# Patient Record
Sex: Female | Born: 1940 | Race: White | Hispanic: No | Smoking: Former smoker
Health system: Southern US, Community
[De-identification: ages and names within clinical notes are randomized; demographics above are authoritative.]

## PROBLEM LIST (undated history)

## (undated) ENCOUNTER — Emergency Department (HOSPITAL_COMMUNITY): Discharge status: Against Medical Advice | Payer: Medicare PPO

## (undated) DIAGNOSIS — K760 Fatty (change of) liver, not elsewhere classified: Secondary | ICD-10-CM

## (undated) DIAGNOSIS — IMO0001 Reserved for inherently not codable concepts without codable children: Secondary | ICD-10-CM

## (undated) DIAGNOSIS — K5730 Diverticulosis of large intestine without perforation or abscess without bleeding: Secondary | ICD-10-CM

## (undated) DIAGNOSIS — Q211 Atrial septal defect: Secondary | ICD-10-CM

## (undated) DIAGNOSIS — F419 Anxiety disorder, unspecified: Secondary | ICD-10-CM

## (undated) DIAGNOSIS — M545 Low back pain, unspecified: Secondary | ICD-10-CM

## (undated) DIAGNOSIS — H7090 Unspecified mastoiditis, unspecified ear: Secondary | ICD-10-CM

## (undated) DIAGNOSIS — E039 Hypothyroidism, unspecified: Secondary | ICD-10-CM

## (undated) DIAGNOSIS — R51 Headache: Secondary | ICD-10-CM

## (undated) DIAGNOSIS — IMO0002 Reserved for concepts with insufficient information to code with codable children: Secondary | ICD-10-CM

## (undated) DIAGNOSIS — H269 Unspecified cataract: Secondary | ICD-10-CM

## (undated) DIAGNOSIS — T7840XA Allergy, unspecified, initial encounter: Secondary | ICD-10-CM

## (undated) DIAGNOSIS — C419 Malignant neoplasm of bone and articular cartilage, unspecified: Secondary | ICD-10-CM

## (undated) DIAGNOSIS — I639 Cerebral infarction, unspecified: Secondary | ICD-10-CM

## (undated) DIAGNOSIS — S2239XA Fracture of one rib, unspecified side, initial encounter for closed fracture: Secondary | ICD-10-CM

## (undated) DIAGNOSIS — E785 Hyperlipidemia, unspecified: Secondary | ICD-10-CM

## (undated) DIAGNOSIS — I679 Cerebrovascular disease, unspecified: Secondary | ICD-10-CM

## (undated) DIAGNOSIS — Z8679 Personal history of other diseases of the circulatory system: Secondary | ICD-10-CM

## (undated) DIAGNOSIS — J3489 Other specified disorders of nose and nasal sinuses: Secondary | ICD-10-CM

## (undated) DIAGNOSIS — I10 Essential (primary) hypertension: Secondary | ICD-10-CM

## (undated) DIAGNOSIS — K5792 Diverticulitis of intestine, part unspecified, without perforation or abscess without bleeding: Secondary | ICD-10-CM

## (undated) DIAGNOSIS — K219 Gastro-esophageal reflux disease without esophagitis: Secondary | ICD-10-CM

## (undated) DIAGNOSIS — C801 Malignant (primary) neoplasm, unspecified: Secondary | ICD-10-CM

## (undated) DIAGNOSIS — M79604 Pain in right leg: Secondary | ICD-10-CM

## (undated) DIAGNOSIS — R0602 Shortness of breath: Secondary | ICD-10-CM

## (undated) DIAGNOSIS — K648 Other hemorrhoids: Secondary | ICD-10-CM

## (undated) HISTORY — PX: CATARACT EXTRACTION: SUR2

## (undated) HISTORY — DX: Reserved for concepts with insufficient information to code with codable children: IMO0002

## (undated) HISTORY — PX: POLYPECTOMY: SHX149

## (undated) HISTORY — PX: TUBAL LIGATION: SHX77

## (undated) HISTORY — PX: CHOLECYSTECTOMY: SHX55

## (undated) HISTORY — DX: Malignant (primary) neoplasm, unspecified: C80.1

## (undated) HISTORY — DX: Personal history of other diseases of the circulatory system: Z86.79

## (undated) HISTORY — DX: Hyperlipidemia, unspecified: E78.5

## (undated) HISTORY — DX: Reserved for inherently not codable concepts without codable children: IMO0001

## (undated) HISTORY — DX: Pain in right leg: M79.604

## (undated) HISTORY — DX: Cerebrovascular disease, unspecified: I67.9

## (undated) HISTORY — DX: Diverticulitis of intestine, part unspecified, without perforation or abscess without bleeding: K57.92

## (undated) HISTORY — DX: Diverticulosis of large intestine without perforation or abscess without bleeding: K57.30

## (undated) HISTORY — DX: Low back pain, unspecified: M54.50

## (undated) HISTORY — PX: TONSILLECTOMY: SHX5217

## (undated) HISTORY — PX: UPPER GASTROINTESTINAL ENDOSCOPY: SHX188

## (undated) HISTORY — DX: Cerebral infarction, unspecified: I63.9

## (undated) HISTORY — DX: Unspecified cataract: H26.9

## (undated) HISTORY — DX: Gastro-esophageal reflux disease without esophagitis: K21.9

## (undated) HISTORY — DX: Unspecified mastoiditis, unspecified ear: H70.90

## (undated) HISTORY — DX: Atrial septal defect: Q21.1

## (undated) HISTORY — DX: Other specified disorders of nose and nasal sinuses: J34.89

## (undated) HISTORY — PX: BREAST LUMPECTOMY: SHX2

## (undated) HISTORY — PX: COLONOSCOPY: SHX174

## (undated) HISTORY — DX: Hypothyroidism, unspecified: E03.9

## (undated) HISTORY — DX: Other hemorrhoids: K64.8

## (undated) HISTORY — DX: Anxiety disorder, unspecified: F41.9

## (undated) HISTORY — PX: SHOULDER SURGERY: SHX246

## (undated) HISTORY — DX: Fatty (change of) liver, not elsewhere classified: K76.0

## (undated) HISTORY — DX: Essential (primary) hypertension: I10

## (undated) HISTORY — DX: Low back pain: M54.5

---

## 2005-04-30 DIAGNOSIS — C801 Malignant (primary) neoplasm, unspecified: Secondary | ICD-10-CM

## 2005-04-30 HISTORY — DX: Malignant (primary) neoplasm, unspecified: C80.1

## 2009-06-28 LAB — HM COLONOSCOPY

## 2009-08-02 DIAGNOSIS — K760 Fatty (change of) liver, not elsewhere classified: Secondary | ICD-10-CM

## 2009-08-02 HISTORY — DX: Fatty (change of) liver, not elsewhere classified: K76.0

## 2010-01-28 DIAGNOSIS — I639 Cerebral infarction, unspecified: Secondary | ICD-10-CM

## 2010-01-28 HISTORY — DX: Cerebral infarction, unspecified: I63.9

## 2010-02-17 ENCOUNTER — Encounter: Payer: Self-pay | Admitting: Emergency Medicine

## 2010-02-17 ENCOUNTER — Inpatient Hospital Stay (HOSPITAL_COMMUNITY): Admission: EM | Admit: 2010-02-17 | Discharge: 2010-02-21 | Payer: Self-pay | Admitting: Internal Medicine

## 2010-02-20 ENCOUNTER — Encounter (INDEPENDENT_AMBULATORY_CARE_PROVIDER_SITE_OTHER): Payer: Self-pay | Admitting: Internal Medicine

## 2010-02-21 ENCOUNTER — Ambulatory Visit: Payer: Self-pay | Admitting: Surgery

## 2010-02-21 ENCOUNTER — Encounter (INDEPENDENT_AMBULATORY_CARE_PROVIDER_SITE_OTHER): Payer: Self-pay | Admitting: Neurology

## 2010-02-21 ENCOUNTER — Encounter (INDEPENDENT_AMBULATORY_CARE_PROVIDER_SITE_OTHER): Payer: Self-pay | Admitting: Cardiology

## 2010-03-03 ENCOUNTER — Ambulatory Visit: Payer: Self-pay | Admitting: Internal Medicine

## 2010-03-03 DIAGNOSIS — K5730 Diverticulosis of large intestine without perforation or abscess without bleeding: Secondary | ICD-10-CM

## 2010-03-03 DIAGNOSIS — E785 Hyperlipidemia, unspecified: Secondary | ICD-10-CM

## 2010-03-03 DIAGNOSIS — E039 Hypothyroidism, unspecified: Secondary | ICD-10-CM

## 2010-03-03 DIAGNOSIS — I679 Cerebrovascular disease, unspecified: Secondary | ICD-10-CM

## 2010-03-03 HISTORY — DX: Diverticulosis of large intestine without perforation or abscess without bleeding: K57.30

## 2010-03-03 HISTORY — DX: Hypothyroidism, unspecified: E03.9

## 2010-03-03 HISTORY — DX: Hyperlipidemia, unspecified: E78.5

## 2010-03-03 HISTORY — DX: Cerebrovascular disease, unspecified: I67.9

## 2010-03-09 ENCOUNTER — Encounter
Admission: RE | Admit: 2010-03-09 | Discharge: 2010-03-28 | Payer: Self-pay | Source: Home / Self Care | Admitting: Internal Medicine

## 2010-03-14 ENCOUNTER — Encounter: Payer: Self-pay | Admitting: Internal Medicine

## 2010-03-28 ENCOUNTER — Telehealth: Payer: Self-pay | Admitting: Internal Medicine

## 2010-03-28 ENCOUNTER — Encounter: Payer: Self-pay | Admitting: Internal Medicine

## 2010-04-10 ENCOUNTER — Ambulatory Visit: Payer: Self-pay | Admitting: Family Medicine

## 2010-04-10 DIAGNOSIS — M549 Dorsalgia, unspecified: Secondary | ICD-10-CM | POA: Insufficient documentation

## 2010-04-12 ENCOUNTER — Telehealth: Payer: Self-pay | Admitting: Internal Medicine

## 2010-04-18 ENCOUNTER — Encounter: Payer: Self-pay | Admitting: Internal Medicine

## 2010-04-20 ENCOUNTER — Telehealth: Payer: Self-pay | Admitting: Internal Medicine

## 2010-04-25 ENCOUNTER — Telehealth: Payer: Self-pay | Admitting: Internal Medicine

## 2010-05-02 ENCOUNTER — Encounter
Admission: RE | Admit: 2010-05-02 | Discharge: 2010-05-02 | Payer: Self-pay | Source: Home / Self Care | Attending: Rheumatology | Admitting: Rheumatology

## 2010-05-18 ENCOUNTER — Encounter: Payer: Self-pay | Admitting: Internal Medicine

## 2010-05-18 ENCOUNTER — Telehealth: Payer: Self-pay | Admitting: Internal Medicine

## 2010-05-23 ENCOUNTER — Ambulatory Visit
Admission: RE | Admit: 2010-05-23 | Discharge: 2010-05-23 | Payer: Self-pay | Source: Home / Self Care | Attending: Internal Medicine | Admitting: Internal Medicine

## 2010-05-23 ENCOUNTER — Other Ambulatory Visit: Payer: Self-pay | Admitting: Internal Medicine

## 2010-05-23 LAB — LIPID PANEL
HDL: 70.9 mg/dL (ref >39.00)
LDL Cholesterol: 97 mg/dL (ref 0–99)
Total CHOL/HDL Ratio: 3
VLDL: 13.2 mg/dL (ref 0.0–40.0)

## 2010-05-23 LAB — HEMOGLOBIN A1C: Hgb A1c MFr Bld: 6 % (ref 4.6–6.5)

## 2010-05-25 ENCOUNTER — Ambulatory Visit: Admission: RE | Admit: 2010-05-25 | Discharge: 2010-05-25 | Payer: Self-pay | Source: Home / Self Care

## 2010-05-25 DIAGNOSIS — I4891 Unspecified atrial fibrillation: Secondary | ICD-10-CM

## 2010-05-29 ENCOUNTER — Encounter: Payer: Self-pay | Admitting: Internal Medicine

## 2010-05-30 NOTE — Assessment & Plan Note (Signed)
Summary: NEW PT EST (MEDICARE PT) // RS   Vital Signs:  Young profile:   70 year old female Height:      61.5 inches Weight:      137 pounds BMI:     25.56 Temp:     98.0 degrees F oral BP sitting:   112 / 80  (right arm) Cuff size:   regular  Vitals Entered By: Duard Brady LPN (March 03, 2010 1:03 PM) CC: new to establish - was in cones for TIA Is Young Diabetic? No   CC:  new to establish - was in cones for TIA.  History of Present Illness: Erica Young who is seen today to establish with our practice.  She has no hospitalized recently for an acute right-sided CVA with left facial droop.  She is scheduled for a neurology follow-up in the near future.  She is also scheduled for a transcranial Doppler bubble study.  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 father died in his early 19s from gastric cancer mother died of complications of cerebral vascular disease in her early 8s two brothers 4 sisters-positive for coronary artery disease and cerebral vascular disease  Social History: Reviewed history and no changes required. Married former tobacco user recently relocated from [place], [place] Masters degreeSmoking Status:  quit  Review of Systems  The Young denies anorexia, fever, weight loss, weight  gain, vision loss, decreased hearing, hoarseness, chest pain, syncope, dyspnea on exertion, peripheral edema, prolonged cough, headaches, hemoptysis, abdominal pain, melena, hematochezia, severe indigestion/heartburn, hematuria, incontinence, genital sores, muscle weakness, suspicious skin lesions, transient blindness, difficulty walking, depression, unusual weight change, abnormal bleeding, enlarged lymph nodes, angioedema, and breast masses.    Physical Exam  General:  Well-developed,well-nourished,in no acute distress; alert,appropriate and cooperative throughout examination; normal blood pressure Head:  Normocephalic and atraumatic without obvious abnormalities. No apparent alopecia or balding. Eyes:  No corneal or conjunctival inflammation noted. EOMI. Perrla. Funduscopic exam benign, without hemorrhages, exudates or papilledema. Vision grossly normal. Ears:  External ear exam shows no significant lesions or deformities.  Otoscopic examination reveals clear canals, tympanic membranes are intact bilaterally without bulging, retraction, inflammation or discharge. Hearing is grossly normal bilaterally. Mouth:  Oral mucosa and oropharynx without lesions or exudates.  Teeth in good repair. Neck:  No deformities, masses, or tenderness noted. Chest Wall:  No deformities, masses, or tenderness noted. Lungs:  Normal respiratory effort, chest expands symmetrically. Lungs are clear to auscultation, no crackles or wheezes. Heart:  Normal rate and regular rhythm. S1 and S2 normal without gallop, murmur, click, rub or other extra sounds. Abdomen:  Bowel sounds positive,abdomen soft and non-tender without masses, organomegaly or hernias noted. Msk:  No deformity or scoliosis noted of thoracic or lumbar spine.   Pulses:  R and L carotid,radial,femoral,dorsalis pedis and posterior tibial pulses are full and equal bilaterally Extremities:  No clubbing, cyanosis, edema, or deformity noted with normal full range of  motion of all joints.   Neurologic:  suggestion  of a slight left facial droop mild dyspraxia on finger to  Nose testing on the left Skin:  Intact without suspicious lesions or rashes Cervical Nodes:  No lymphadenopathy noted Psych:  Cognition and judgment appear intact. Alert and cooperative with normal attention span and concentration. No apparent delusions, illusions, hallucinations   Impression & Recommendations:  Problem # 1:  HYPERLIPIDEMIA (ICD-272.4)  Her updated medication list for this problem includes:    Lipitor 10 Mg Tabs (Atorvastatin calcium) ..... Qd  Problem # 2:  HYPOTHYROIDISM (ICD-244.9)  Her updated medication list for this problem includes:    Levothyroxine Sodium 25 Mcg Tabs (Levothyroxine sodium) ..... One daily  Problem # 3:  DIVERTICULOSIS, COLON (ICD-562.10)  Problem # 4:  PREVENTIVE HEALTH CARE (ICD-V70.0)  Problem # 5:  CEREBROVASCULAR DISEASE (ICD-437.9)  Complete Medication List: 1)  Lipitor 10 Mg Tabs (Atorvastatin calcium) .... Qd 2)  Plavix 75 Mg Tabs (Clopidogrel bisulfate) .... Qd 3)  Hydroxyzine Hcl 25 Mg Tabs (Hydroxyzine hcl) .... Prn 4)  Caltrate 600+d 600-400 Mg-unit Tabs (Calcium carbonate-vitamin d) .... Qd 5)  Selenium 100 Mcg Tabs (Selenium) .... Qd 6)  Vitamin C 500 Mg Tabs (Ascorbic acid) .... Qd 7)  Align Caps (Probiotic product) .... Qd 8)  Colostrum 500 Mg Caps (Misc natural products) .... Qd 9)  Shark Cartilage 500 Mg Caps (Shark cartilage) .... Qd 10)  Levothyroxine Sodium 25 Mcg Tabs (Levothyroxine sodium) .... One daily  Young Instructions: 1)  Please schedule a follow-up appointment in 1 month. 2)  Limit your Sodium (Salt). 3)  It is important that you exercise regularly at least 20 minutes 5 times a week. If you develop chest pain, have severe difficulty breathing, or feel very tired , stop exercising immediately and seek medical attention. 4)  neurology follow-up as scheduled 5)  physical therapy as  scheduled andPrescriptions: LEVOTHYROXINE SODIUM 25 MCG TABS (LEVOTHYROXINE SODIUM) one daily  #90 x 0   Entered and Authorized by:   Gordy Savers  MD   Signed by:   Gordy Savers  MD on 03/03/2010   Method used:   Print then Give to Young   RxID:   [id number] PLAVIX 75 MG TABS (CLOPIDOGREL BISULFATE) qd  #90 x 0   Entered and Authorized by:   Gordy Savers  MD   Signed by:   Gordy Savers  MD on 03/03/2010   Method used:   Print then Give to Young   RxID:   [id number] LIPITOR 10 MG TABS (ATORVASTATIN CALCIUM) qd  #90 x 0   Entered and Authorized by:   Gordy Savers  MD   Signed by:   Gordy Savers  MD on 03/03/2010   Method used:   Print then Give to Young   RxID:   [id number]    Orders Added: 1)  New Young 65&> [64403]   Immunization History:  Influenza Immunization History:    Influenza:  Historical (01/28/2010)   Immunization History:  Influenza Immunization History:    Influenza:  Historical (01/28/2010)  given in [place]   KIK  Appended Document: Orders Update    Clinical Lists Changes  Orders: Added new Referral order of Physical Therapy Referral (PT) - Signed

## 2010-05-30 NOTE — Miscellaneous (Signed)
Summary: Initial Summary for PT Services/[facility]  Initial Summary for PT Services/ Rehab   Imported By: Maryln Gottron 03/16/2010 11:31:12  _____________________________________________________________________  External Attachment:    Type:   Image     Comment:   External Document

## 2010-05-30 NOTE — Progress Notes (Signed)
Summary: refill plavix  Phone Note Refill Request Message from:  Fax from Pharmacy on March 28, 2010 12:04 PM  Refills Requested: Medication #1:  PLAVIX 75 MG TABS qd [facility]   Method Requested: Fax to Local Pharmacy Initial call taken by: Duard Brady LPN,  March 28, 2010 12:05 PM    Prescriptions: PLAVIX 75 MG TABS (CLOPIDOGREL BISULFATE) qd  #90 x 0   Entered by:   Duard Brady LPN   Authorized by:   Gordy Savers  MD   Signed by:   Duard Brady LPN on 78/46/9629   Method used:   Faxed to ...       [facility] [address] # [phone]* (retail)       [address]       [place], [place]  [address]       Ph: [phone]       Fax: [phone]   RxID:   [id number]

## 2010-05-30 NOTE — Miscellaneous (Signed)
Summary: Discharge Summary for PT [facility]  Discharge Summary for PT Services/[facility]   Imported By: Maryln Gottron 03/31/2010 11:09:54  _____________________________________________________________________  External Attachment:    Type:   Image     Comment:   External Document

## 2010-05-31 ENCOUNTER — Encounter: Payer: Self-pay | Admitting: Rheumatology

## 2010-06-01 NOTE — Letter (Signed)
Summary: Request for Medical Clearance/[facility]  Request for Medical Clearance/[facility]   Imported By: Maryln Gottron 04/27/2010 15:27:16  _____________________________________________________________________  External Attachment:    Type:   Image     Comment:   External Document

## 2010-06-01 NOTE — Progress Notes (Signed)
Summary: pt is on lipitor  Phone Note Call from Patient   Caller: Patient Call For: Gordy Savers  MD Summary of Call: pt is on lipitor.  pt is requesting lipid test. Can I sch? Initial call taken by: Heron Sabins,  May 18, 2010 4:30 PM  Follow-up for Phone Call        yes Follow-up by: Gordy Savers  MD,  May 18, 2010 5:10 PM  Additional Follow-up for Phone Call Additional follow up Details #1::        05-23-2010 815am Additional Follow-up by: Heron Sabins,  May 19, 2010 8:42 AM

## 2010-06-01 NOTE — Assessment & Plan Note (Signed)
Summary: leg/back pain/njr   Vital Signs:  Young profile:   70 year old female Weight:      139 pounds Temp:     98.0 degrees F oral BP sitting:   120 / 70  (left arm) Cuff size:   regular  Vitals Entered By: Duard Brady LPN (April 10, 2010 12:56 PM) CC: pain in leg/back on/off x3 months Is Young Diabetic? No   CC:  pain in leg/back on/off x3 months.  History of Present Illness:  Erica Young who presents with a 3 to 4 month history oflow back pain with radiation down the right leg.  This was evaluated in [place] state in September that included a lumbar MRI.  The Young has a DVD of the lumbar MRI, but failed to bring this with her today.  She was hospitalized in October for a right brain stroke.  She is scheduled for neurology follow-up next month.  Presently, she is on aspirin and Plavix.  Denies any focal neurological symptoms.  There is no motor weakness on the right and her only complaint is pain.    She did not benefit from physical therapy in [place].  She was told that she might benefit from spinal injections.  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 father died in his early 3s from gastric cancer mother died of complications of cerebral vascular disease in her early 14s two brothers 4 sisters-positive for coronary artery disease and cerebral vascular disease  Review of Systems  The Young denies anorexia, fever, weight loss, weight gain, vision loss,  decreased hearing, hoarseness, chest pain, syncope, dyspnea on exertion, peripheral edema, prolonged cough, headaches, hemoptysis, abdominal pain, melena, hematochezia, severe indigestion/heartburn, hematuria, incontinence, genital sores, muscle weakness, suspicious skin lesions, transient blindness, difficulty walking, depression, unusual weight change, abnormal bleeding, enlarged lymph nodes, angioedema, and breast masses.    Physical Exam  General:  Well-developed,well-nourished,in no acute distress; alert,appropriate and cooperative throughout examination Msk:  negative straight leg test patellar and Achilles reflexes are brisk and equal Neurologic:  normal ankle flexion and extension able walk on heels and toes without difficulty   Impression & Recommendations:  Problem # 1:  BACK PAIN (ICD-724.5)  Her updated medication list for this problem includes:    Cyclobenzaprine Hcl 5 Mg Tabs (Cyclobenzaprine hcl) ..... One every 8 hours as needed for back pain    Hydrocodone-acetaminophen 5-500 Mg Tabs (Hydrocodone-acetaminophen) ..... One every 8 hours as needed for pain    Her updated medication list for this problem includes:    Cyclobenzaprine Hcl 5 Mg Tabs (Cyclobenzaprine hcl) ..... One every 8 hours as needed for back pain    Hydrocodone-acetaminophen 5-500 Mg Tabs (Hydrocodone-acetaminophen) ..... One every 8 hours as needed for pain  Orders: Pain Clinic Referral (Pain)  Problem # 2:  CEREBROVASCULAR DISEASE (ICD-437.9)  Problem # 3:  DIVERTICULOSIS, COLON (ICD-562.10)  Complete Medication List: 1)  Lipitor 10 Mg Tabs (Atorvastatin calcium) .... Qd 2)  Plavix 75 Mg Tabs (Clopidogrel bisulfate) .... Qd 3)  Hydroxyzine Hcl 25 Mg Tabs (Hydroxyzine hcl) .... Prn 4)  Caltrate 600+d 600-400  Mg-unit Tabs (Calcium carbonate-vitamin d) .... Qd 5)  Selenium 100 Mcg Tabs (Selenium) .... Qd 6)  Vitamin C 500 Mg Tabs (Ascorbic acid) .... Qd 7)  Align Caps (Probiotic product) ....  Qd 8)  Colostrum 500 Mg Caps (Misc natural products) .... Qd 9)  Shark Cartilage 500 Mg Caps (Shark cartilage) .... Qd 10)  Levothyroxine Sodium 25 Mcg Tabs (Levothyroxine sodium) .... One daily Erica)  Cyclobenzaprine Hcl 5 Mg Tabs (Cyclobenzaprine hcl) .... One every 8 hours as needed for back pain 12)  Hydrocodone-acetaminophen 5-500 Mg Tabs (Hydrocodone-acetaminophen) .... One every 8 hours as needed for pain  Young Instructions: 1)  Keep active but avoid activities that are painful. Apply moist heat  to lower back several times a day. 2)  take medications as directed 3)  neurology follow-up as scheduled 4)  Bring  disk of lumbar MRI at your convenience Prescriptions: HYDROCODONE-ACETAMINOPHEN 5-500 MG TABS (HYDROCODONE-ACETAMINOPHEN) one every 8 hours as needed for pain  #30 x 2   Entered and Authorized by:   Gordy Savers  MD   Signed by:   Gordy Savers  MD on 04/10/2010   Method used:   Print then Give to Young   RxID:   [id number] CYCLOBENZAPRINE HCL 5 MG TABS (CYCLOBENZAPRINE HCL) one every 8 hours as needed for back pain  #30 x 2   Entered and Authorized by:   Gordy Savers  MD   Signed by:   Gordy Savers  MD on 04/10/2010   Method used:   Print then Give to Young   RxID:   [id number]    Orders Added: 1)  Est. Young Level IV [47425] 2)  Pain Clinic Referral [Pain]

## 2010-06-01 NOTE — Progress Notes (Signed)
Summary: stopping plavix  Phone Note Call from Patient   Caller: Patient Call For: Gordy Savers  MD Summary of Call: please call back - no other msg left on VM .Earlean Polka   [phone] Initial call taken by: Duard Brady LPN,  April 20, 2010 1:29 PM  Follow-up for Phone Call        pt stoppped by - need ok to stop plavix before injection for her back -please advise. kik Follow-up by: Duard Brady LPN,  April 20, 2010 3:35 PM  Additional Follow-up for Phone Call Additional follow up Details #1::        OK to stop 5 days prior  to  injection Additional Follow-up by: Gordy Savers  MD,  April 20, 2010 5:13 PM    Additional Follow-up for Phone Call Additional follow up Details #2::    attempt to call Dr. Ines Bloomer office - office closed until next tuesday - called pt , informed her ok to stop plavix tomorrow - injection to be done on thursday per pt. KIK Follow-up by: Duard Brady LPN,  April 21, 2010 9:10 AM

## 2010-06-01 NOTE — Letter (Signed)
Summary: [facility]  [facility]   Imported By: Maryln Gottron 05/25/2010 15:31:21  _____________________________________________________________________  External Attachment:    Type:   Image     Comment:   External Document

## 2010-06-01 NOTE — Progress Notes (Signed)
Summary: refill mail order - cyclobenzaprine  Phone Note Refill Request Message from:  Fax from Pharmacy on April 25, 2010 12:30 PM  Refills Requested: Medication #1:  CYCLOBENZAPRINE HCL 5 MG TABS one every 8 hours as needed for back pain right source mail-order   Method Requested: Fax to Local Pharmacy Initial call taken by: Duard Brady LPN,  April 25, 2010 12:31 PM  Follow-up for Phone Call        ok'd refill x1 since pt was given printed rx at dec appt. with refills   KIK Follow-up by: Duard Brady LPN,  April 25, 2010 12:36 PM    Prescriptions: CYCLOBENZAPRINE HCL 5 MG TABS (CYCLOBENZAPRINE HCL) one every 8 hours as needed for back pain  #30 x 0   Entered by:   Duard Brady LPN   Authorized by:   Gordy Savers  MD   Signed by:   Duard Brady LPN on 34/74/2595   Method used:   Historical   RxID:   [id number]

## 2010-06-01 NOTE — Progress Notes (Signed)
Summary: Pt req different referral [id number] because appt not until 06/02/09  Phone Note Call from Patient Call back at [facility] Phone [phone]   Caller: Patient Summary of Call: Pt was in for ov about 2 days and was referred to pain clinic re: sciatic. Pt was not able to get appt with them, until 06/02/10. Pt does not want to wait that long, with the amount of pain she is is. Pt is req a different Dr re: pain or referral to Rheumatologist. Pls call pt asap today.  Initial call taken by: Lucy Antigua,  April 12, 2010 1:29 PM  Follow-up for Phone Call        spoke with pt - informed that Dr. Frederica Kuster gave ok to refer to rheumotology - terri has info and withh make appt. KIk Follow-up by: Gordy Savers  MD,  April 13, 2010 8:04 AM    please refer to Rheumatology practice with capability for spinal epidurals

## 2010-06-01 NOTE — Consult Note (Signed)
Summary: Rheumatology-Dr. Stacey Drain  Rheumatology-Dr. Stacey Drain   Imported By: Maryln Gottron 05/08/2010 13:12:08  _____________________________________________________________________  External Attachment:    Type:   Image     Comment:   External Document

## 2010-06-09 ENCOUNTER — Encounter: Payer: Self-pay | Admitting: Internal Medicine

## 2010-06-09 ENCOUNTER — Ambulatory Visit (INDEPENDENT_AMBULATORY_CARE_PROVIDER_SITE_OTHER): Payer: 59 | Admitting: Internal Medicine

## 2010-06-09 DIAGNOSIS — E785 Hyperlipidemia, unspecified: Secondary | ICD-10-CM

## 2010-06-09 DIAGNOSIS — I10 Essential (primary) hypertension: Secondary | ICD-10-CM | POA: Insufficient documentation

## 2010-06-09 DIAGNOSIS — I679 Cerebrovascular disease, unspecified: Secondary | ICD-10-CM

## 2010-06-09 MED ORDER — LISINOPRIL 20 MG PO TABS: 20.0000 mg | ORAL_TABLET | Freq: Every day | ORAL | Status: DC

## 2010-06-09 NOTE — Patient Instructions (Signed)
Limit your sodium (Salt) intake   It is important that you exercise regularly, at least 20 minutes 3 to 4 times per week.  If you develop chest pain or shortness of breath seek  medical attention. Take a calcium supplement, plus [phone] units of vitamin D Please check your blood pressure on a regular basis.  If it is consistently greater than 150/90, please make an office appointment. Return in one month for follow-up

## 2010-06-09 NOTE — Progress Notes (Signed)
  Subjective:    Patient ID: Erica Young, female    DOB: 05-Oct-1940, 70 y.o.   MRN: [id number]  HPI  70 year old patient who is seen today for follow-up.  She has a history of a right brain CVA in October of 2011 and is followed closely by neurology.  Presently, she is being evaluated with an event monitor.  She has a history of a PFO with a positive bubble study.  Evaluation also revealed a right MCA stenosis.  There is remote history of breast cancer.  She has treated hypothyroidism. Following her stroke, she has been placed on Lipitor therapy.  Recent lipid profile reviewed.  She has tolerated the medication well.  No prior history of hypertension   Review of Systems  Constitutional: Negative.   HENT: Negative for hearing loss, congestion, sore throat, rhinorrhea, dental problem, sinus pressure and tinnitus.   Eyes: Negative for pain, discharge and visual disturbance.  Respiratory: Negative for cough and shortness of breath.   Cardiovascular: Negative for chest pain, palpitations and leg swelling.  Gastrointestinal: Negative for nausea, vomiting, abdominal pain, diarrhea, constipation, blood in stool and abdominal distention.  Genitourinary: Negative for dysuria, urgency, frequency, hematuria, flank pain, vaginal bleeding, vaginal discharge, difficulty urinating, vaginal pain and pelvic pain.  Musculoskeletal: Negative for joint swelling, arthralgias and gait problem.  Skin: Negative for rash.  Neurological: Negative for dizziness, syncope, speech difficulty, weakness, numbness and headaches.  Hematological: Negative for adenopathy.  Psychiatric/Behavioral: Negative for behavioral problems, dysphoric mood and agitation. The patient is not nervous/anxious.        Objective:   Physical Exam  Constitutional: She is oriented to person, place, and time. She appears well-developed and well-nourished.  HENT:  Head: Normocephalic.  Right Ear: External ear normal.  Left Ear: External ear  normal.  Mouth/Throat: Oropharynx is clear and moist.  Eyes: Conjunctivae and EOM are normal. Pupils are equal, round, and reactive to light.  Neck: Normal range of motion. Neck supple. No thyromegaly present.  Cardiovascular: Normal rate, regular rhythm, normal heart sounds and intact distal pulses.   Pulmonary/Chest: Effort normal and breath sounds normal.  Abdominal: Soft. Bowel sounds are normal. She exhibits no mass. There is no tenderness.  Musculoskeletal: Normal range of motion.  Lymphadenopathy:    She has no cervical adenopathy.  Neurological: She is alert and oriented to person, place, and time.  Skin: Skin is warm and dry. No rash noted.  Psychiatric: She has a normal mood and affect. Her behavior is normal.          Assessment and plan  Cerebrovascular disease, status post right brain stroke October 2011.  Will continue neurology follow-up will complete the event monitor as scheduled and maintain the patient on Plavix.  She is not on aspirin due to apparently a remote history of gastric intolerance.  Will discuss with neurology about combination therapy Hypertension-blood pressure consistently in the 160/90 range.  Today will place on ACE inhibition and recheck in one month Dyslipidemia.  Compliance with  Lipitor stressed.  Will resume will recheck a SGOT in 3 months

## 2010-06-14 ENCOUNTER — Encounter: Payer: Self-pay | Admitting: Cardiovascular Disease

## 2010-07-05 ENCOUNTER — Encounter: Payer: Self-pay | Admitting: Internal Medicine

## 2010-07-06 NOTE — Procedures (Signed)
Summary: Summary Report  Summary Report   Imported By: Erle Crocker 06/28/2010 15:40:37  _____________________________________________________________________  External Attachment:    Type:   Image     Comment:   External Document

## 2010-07-07 ENCOUNTER — Ambulatory Visit (INDEPENDENT_AMBULATORY_CARE_PROVIDER_SITE_OTHER): Payer: 59 | Admitting: Internal Medicine

## 2010-07-07 ENCOUNTER — Encounter: Payer: Self-pay | Admitting: Internal Medicine

## 2010-07-07 DIAGNOSIS — I10 Essential (primary) hypertension: Secondary | ICD-10-CM

## 2010-07-07 DIAGNOSIS — I679 Cerebrovascular disease, unspecified: Secondary | ICD-10-CM

## 2010-07-07 MED ORDER — LEVOTHYROXINE SODIUM 25 MCG PO TABS: 25.0000 ug | ORAL_TABLET | Freq: Every day | ORAL | Status: DC

## 2010-07-07 MED ORDER — CLOPIDOGREL BISULFATE 75 MG PO TABS: 75.0000 mg | ORAL_TABLET | Freq: Every day | ORAL | Status: DC

## 2010-07-07 MED ORDER — SIMVASTATIN 20 MG PO TABS: 20.0000 mg | ORAL_TABLET | Freq: Every evening | ORAL | Status: DC

## 2010-07-07 MED ORDER — LOSARTAN POTASSIUM 100 MG PO TABS: 100.0000 mg | ORAL_TABLET | Freq: Every day | ORAL | Status: DC

## 2010-07-07 NOTE — Patient Instructions (Signed)
Get plenty of rest, Drink lots of  clear liquids, and use Tylenol or ibuprofen for fever and discomfort.    

## 2010-07-07 NOTE — Progress Notes (Signed)
  Subjective:    Patient ID: Erica Young, female    DOB: 10/19/1940, 70 y.o.   MRN: [id number]  HPI   70 year old patient who has a history of visceral vascular disease. She was placed on lisinopril one month ago  Due to  Hypertension.   She has had some URI symptoms but has also developed a refractory incessant cough. No real sputum production she has hypothyroidism. Denies any focal neurological complaints. She remains on Plavix   Review of Systems  Constitutional: Negative.   HENT: Positive for congestion and rhinorrhea. Negative for hearing loss, sore throat, dental problem, sinus pressure and tinnitus.   Eyes: Negative for pain, discharge and visual disturbance.  Respiratory: Positive for cough. Negative for shortness of breath.   Cardiovascular: Negative for chest pain, palpitations and leg swelling.  Gastrointestinal: Negative for nausea, vomiting, abdominal pain, diarrhea, constipation, blood in stool and abdominal distention.  Genitourinary: Negative for dysuria, urgency, frequency, hematuria, flank pain, vaginal bleeding, vaginal discharge, difficulty urinating, vaginal pain and pelvic pain.  Musculoskeletal: Negative for joint swelling, arthralgias and gait problem.  Skin: Negative for rash.  Neurological: Negative for dizziness, syncope, speech difficulty, weakness, numbness and headaches.  Hematological: Negative for adenopathy.  Psychiatric/Behavioral: Negative for behavioral problems, dysphoric mood and agitation. The patient is not nervous/anxious.        Objective:   Physical Exam  Constitutional: She is oriented to person, place, and time. She appears well-developed and well-nourished.  HENT:  Head: Normocephalic.  Right Ear: External ear normal.  Left Ear: External ear normal.  Mouth/Throat: Oropharynx is clear and moist.  Eyes: Conjunctivae and EOM are normal. Pupils are equal, round, and reactive to light.  Neck: Normal range of motion. Neck supple. No thyromegaly  present.  Cardiovascular: Normal rate, regular rhythm, normal heart sounds and intact distal pulses.   Pulmonary/Chest: Effort normal and breath sounds normal.  Abdominal: Soft. Bowel sounds are normal. She exhibits no mass. There is no tenderness.  Musculoskeletal: Normal range of motion.  Lymphadenopathy:    She has no cervical adenopathy.  Neurological: She is alert and oriented to person, place, and time.  Skin: Skin is warm and dry. No rash noted.  Psychiatric: She has a normal mood and affect. Her behavior is normal.          Assessment & Plan:   cough probably least in part related to ACE inhibitor  Hypertension-  We'll substitute losartan 100 mg daily. She has been asked to monitor home blood pressure readings and to return in 4 weeks for followup.

## 2010-07-12 LAB — LIPID PANEL
LDL Cholesterol: 92 mg/dL (ref 0–99)
Total CHOL/HDL Ratio: 3.1 RATIO
Triglycerides: 146 mg/dL (ref <150)
VLDL: 29 mg/dL (ref 0–40)

## 2010-07-12 LAB — CBC
HCT: 35.6 % — ABNORMAL LOW (ref 36.0–46.0)
HCT: 39.8 % (ref 36.0–46.0)
Hemoglobin: 12 g/dL (ref 12.0–15.0)
MCHC: 34 g/dL (ref 30.0–36.0)
MCV: 95.7 fL (ref 78.0–100.0)
Platelets: 149 10*3/uL — ABNORMAL LOW (ref 150–400)
RBC: 3.72 MIL/uL — ABNORMAL LOW (ref 3.87–5.11)
RDW: 13 % (ref 11.5–15.5)
RDW: 13.7 % (ref 11.5–15.5)
WBC: 5.8 10*3/uL (ref 4.0–10.5)

## 2010-07-12 LAB — APTT: aPTT: 34 seconds (ref 24–37)

## 2010-07-12 LAB — DIFFERENTIAL
Basophils Absolute: 0 10*3/uL (ref 0.0–0.1)
Basophils Absolute: 0.1 10*3/uL (ref 0.0–0.1)
Basophils Relative: 0 % (ref 0–1)
Basophils Relative: 1 % (ref 0–1)
Eosinophils Absolute: 0.1 10*3/uL (ref 0.0–0.7)
Monocytes Absolute: 0.3 10*3/uL (ref 0.1–1.0)
Neutro Abs: 3.2 10*3/uL (ref 1.7–7.7)
Neutro Abs: 4 10*3/uL (ref 1.7–7.7)
Neutrophils Relative %: 68 % (ref 43–77)

## 2010-07-12 LAB — URINE DRUGS OF ABUSE SCREEN W ALC, ROUTINE (REF LAB)
Barbiturate Quant, Ur: NEGATIVE
Creatinine,U: 92.7 mg/dL
Marijuana Metabolite: NEGATIVE
Methadone: NEGATIVE
Phencyclidine (PCP): NEGATIVE

## 2010-07-12 LAB — COMPREHENSIVE METABOLIC PANEL
ALT: 18 U/L (ref 0–35)
Alkaline Phosphatase: 49 U/L (ref 39–117)
BUN: 8 mg/dL (ref 6–23)
CO2: 24 mEq/L (ref 19–32)
Chloride: 113 mEq/L — ABNORMAL HIGH (ref 96–112)
GFR calc non Af Amer: >60 mL/min (ref >60)
Glucose, Bld: 99 mg/dL (ref 70–99)
Potassium: 3.6 mEq/L (ref 3.5–5.1)
Total Bilirubin: 0.4 mg/dL (ref 0.3–1.2)

## 2010-07-12 LAB — URINALYSIS, ROUTINE W REFLEX MICROSCOPIC
Ketones, ur: NEGATIVE mg/dL
Nitrite: NEGATIVE
Protein, ur: NEGATIVE mg/dL
pH: 7 (ref 5.0–8.0)

## 2010-07-12 LAB — T4, FREE: Free T4: 1.1 ng/dL (ref 0.80–1.80)

## 2010-07-12 LAB — POCT I-STAT, CHEM 8
Calcium, Ion: 1.04 mmol/L — ABNORMAL LOW (ref 1.12–1.32)
Chloride: 109 mEq/L (ref 96–112)
HCT: 40 % (ref 36.0–46.0)
Sodium: 139 mEq/L (ref 135–145)
TCO2: 25 mmol/L (ref 0–100)

## 2010-07-12 LAB — PHOSPHORUS: Phosphorus: 2.6 mg/dL (ref 2.3–4.6)

## 2010-07-12 LAB — PROTIME-INR
INR: 1.08 (ref 0.00–1.49)
Prothrombin Time: 14.2 seconds (ref 11.6–15.2)

## 2010-07-20 ENCOUNTER — Ambulatory Visit (INDEPENDENT_AMBULATORY_CARE_PROVIDER_SITE_OTHER): Payer: 59 | Admitting: Internal Medicine

## 2010-07-20 ENCOUNTER — Encounter: Payer: Self-pay | Admitting: Internal Medicine

## 2010-07-20 DIAGNOSIS — I10 Essential (primary) hypertension: Secondary | ICD-10-CM

## 2010-07-20 DIAGNOSIS — I679 Cerebrovascular disease, unspecified: Secondary | ICD-10-CM

## 2010-07-20 MED ORDER — CIPROFLOXACIN HCL 500 MG PO TABS: 500.0000 mg | ORAL_TABLET | Freq: Two times a day (BID) | ORAL | Status: AC

## 2010-07-20 MED ORDER — METRONIDAZOLE 500 MG PO TABS: 500.0000 mg | ORAL_TABLET | Freq: Three times a day (TID) | ORAL | Status: AC

## 2010-07-20 NOTE — Patient Instructions (Signed)
Maintain hydration by drinking small amounts of clear fluids frequently, then soft diet, and then advance diet as tolerated. May use OTC Imodium if desired for any diarrhea.  Call if symptoms worsen, high fever, severe weakness or fainting, increased abdominal pain, blood in stool or vomit, or failure to improve in 2-3 days.  Call or return to clinic prn if these symptoms worsen or fail to improve as anticipated.

## 2010-07-20 NOTE — Progress Notes (Signed)
  Subjective:    Patient ID: Erica Young, female    DOB: [birth year], 70 y.o.   MRN: [id number]  HPI  70 year old patient he presents with a two-day history of lower abdominal pain with radiation to the left flank area. She has been treated for acute diverticular disease on 2 prior occasions one of which required a hospital in [place]. Associated symptoms include chills but no fever she has been on a clear liquid diet for the past 2 days and her symptoms have essentially resolved. At the present time she has no further chills no further abdominal pain. She remains on a clear liquid diet. She did have a normal well formed stool earlier today; no nausea or vomiting   Review of Systems  Constitutional: Negative.   HENT: Negative for hearing loss, congestion, sore throat, rhinorrhea, dental problem, sinus pressure and tinnitus.   Eyes: Negative for pain, discharge and visual disturbance.  Respiratory: Negative for cough and shortness of breath.   Cardiovascular: Negative for chest pain, palpitations and leg swelling.  Gastrointestinal: Positive for abdominal pain. Negative for nausea, vomiting, diarrhea, constipation, blood in stool and abdominal distention.  Genitourinary: Negative for dysuria, urgency, frequency, hematuria, flank pain, vaginal bleeding, vaginal discharge, difficulty urinating, vaginal pain and pelvic pain.  Musculoskeletal: Negative for joint swelling, arthralgias and gait problem.  Skin: Negative for rash.  Neurological: Negative for dizziness, syncope, speech difficulty, weakness, numbness and headaches.  Hematological: Negative for adenopathy.  Psychiatric/Behavioral: Negative for behavioral problems, dysphoric mood and agitation. The patient is not nervous/anxious.        Objective:   Physical Exam  Constitutional: She is oriented to person, place, and time. She appears well-developed and well-nourished.  HENT:  Head: Normocephalic.  Right Ear: External ear normal.    Left Ear: External ear normal.  Mouth/Throat: Oropharynx is clear and moist.  Eyes: Conjunctivae and EOM are normal. Pupils are equal, round, and reactive to light.  Neck: Normal range of motion. Neck supple. No thyromegaly present.  Cardiovascular: Normal rate, regular rhythm, normal heart sounds and intact distal pulses.   Pulmonary/Chest: Effort normal and breath sounds normal.  Abdominal: Soft. Bowel sounds are normal. She exhibits no mass. There is tenderness.        Very mild left lower quadrant tenderness no masses bowel sounds were normal no guarding or rebound  Musculoskeletal: Normal range of motion.  Lymphadenopathy:    She has no cervical adenopathy.  Neurological: She is alert and oriented to person, place, and time.  Skin: Skin is warm and dry. No rash noted.  Psychiatric: She has a normal mood and affect. Her behavior is normal.          Assessment & Plan:   abdominal pain presently essentially resolved.  Believe that this probably represents an episode of mild diverticular disease. We'll give prescriptions for antibiotic therapy that she will hold unless her symptoms recur. We'll slowly advance her diet

## 2010-08-07 ENCOUNTER — Ambulatory Visit (INDEPENDENT_AMBULATORY_CARE_PROVIDER_SITE_OTHER): Payer: 59 | Admitting: Internal Medicine

## 2010-08-07 ENCOUNTER — Encounter: Payer: Self-pay | Admitting: Internal Medicine

## 2010-08-07 DIAGNOSIS — I10 Essential (primary) hypertension: Secondary | ICD-10-CM

## 2010-08-07 DIAGNOSIS — I679 Cerebrovascular disease, unspecified: Secondary | ICD-10-CM

## 2010-08-07 DIAGNOSIS — E785 Hyperlipidemia, unspecified: Secondary | ICD-10-CM

## 2010-08-07 NOTE — Patient Instructions (Signed)
Limit your sodium (Salt) intake    It is important that you exercise regularly, at least 20 minutes 3 to 4 times per week.  If you develop chest pain or shortness of breath seek  medical attention.  Please check your blood pressure on a regular basis.  If it is consistently greater than 150/90, please make an office appointment.  Return in 6 months for follow-up   

## 2010-08-07 NOTE — Progress Notes (Signed)
  Subjective:    Patient ID: Erica Young, female    DOB: 16-Apr-1941, 70 y.o.   MRN: [id number]  HPI a 70 year old patient who is seen today for followup. She was seen one month ago with mild diverticular disease that has resolved. She did not require antibiotic therapy. She has a history of cerebral vascular disease which has been stable she remains on Plavix and has maintained nice blood pressure control. She is on dyslipidemia drugs which she continues to tolerate. No concerns or complaints today. No "focal neurological symptoms      Review of Systems  Constitutional: Negative.   HENT: Negative for hearing loss, congestion, sore throat, rhinorrhea, dental problem, sinus pressure and tinnitus.   Eyes: Negative for pain, discharge and visual disturbance.  Respiratory: Negative for cough and shortness of breath.   Cardiovascular: Negative for chest pain, palpitations and leg swelling.  Gastrointestinal: Negative for nausea, vomiting, abdominal pain, diarrhea, constipation, blood in stool and abdominal distention.  Genitourinary: Negative for dysuria, urgency, frequency, hematuria, flank pain, vaginal bleeding, vaginal discharge, difficulty urinating, vaginal pain and pelvic pain.  Musculoskeletal: Negative for joint swelling, arthralgias and gait problem.  Skin: Negative for rash.  Neurological: Negative for dizziness, syncope, speech difficulty, weakness, numbness and headaches.  Hematological: Negative for adenopathy.  Psychiatric/Behavioral: Negative for behavioral problems, dysphoric mood and agitation. The patient is not nervous/anxious.        Objective:   Physical Exam  Constitutional: She is oriented to person, place, and time. She appears well-developed and well-nourished.  HENT:  Head: Normocephalic.  Right Ear: External ear normal.  Left Ear: External ear normal.  Mouth/Throat: Oropharynx is clear and moist.  Eyes: Conjunctivae and EOM are normal. Pupils are equal, round, and  reactive to light.  Neck: Normal range of motion. Neck supple. No thyromegaly present.  Cardiovascular: Normal rate, regular rhythm, normal heart sounds and intact distal pulses.   Pulmonary/Chest: Effort normal and breath sounds normal.  Abdominal: Soft. Bowel sounds are normal. She exhibits no mass. There is no tenderness.  Musculoskeletal: Normal range of motion.  Lymphadenopathy:    She has no cervical adenopathy.  Neurological: She is alert and oriented to person, place, and time.  Skin: Skin is warm and dry. No rash noted.  Psychiatric: She has a normal mood and affect. Her behavior is normal.          Assessment & Plan:   Hypertension. Stable Cerebrovascular disease. Remains asymptomatic on Plavix Status post mild diverticular episode. Remains asymptomatic  We'll recheck in 6 months.

## 2010-08-23 ENCOUNTER — Encounter: Payer: Self-pay | Admitting: Internal Medicine

## 2010-08-24 ENCOUNTER — Encounter: Payer: Self-pay | Admitting: Internal Medicine

## 2010-08-25 ENCOUNTER — Other Ambulatory Visit: Payer: Self-pay | Admitting: Internal Medicine

## 2010-08-25 ENCOUNTER — Other Ambulatory Visit: Payer: Self-pay

## 2010-08-25 MED ORDER — CLOPIDOGREL BISULFATE 75 MG PO TABS: 75.0000 mg | ORAL_TABLET | Freq: Every day | ORAL | Status: DC

## 2010-08-25 NOTE — Telephone Encounter (Signed)
Pt came into office - told cynthia she wanted to speak with me - would not give name or reason for need to see me. I told her I was with pts asn doctor running behind - she wanted to wait. At the end of the morning session - i pulled her back - she only needed her plavix refills to [facility]. I explained that she should have given this info to the ladies up front ,they would of seen that i got it and it would of been taken care of before the end of the day. I explained that we cant have walk in's waiting for [place] unless it is an emergency or an appt is put on the books. That this request could of been taken care of by the ladies at front desk as they tried to handle her request in a timely manner. Verbalized understanding.   rx called into [facility]. KIK

## 2010-08-28 ENCOUNTER — Telehealth: Payer: Self-pay | Admitting: Internal Medicine

## 2010-08-28 MED ORDER — PRAVASTATIN SODIUM 40 MG PO TABS: 40.0000 mg | ORAL_TABLET | Freq: Every day | ORAL | Status: DC

## 2010-08-28 NOTE — Telephone Encounter (Signed)
Attempt to call - ans mach at home # - LMTCB if questions - will send new rx to [facility] . KIK

## 2010-08-28 NOTE — Telephone Encounter (Signed)
Pravastatin 40 mg #90 to take one daily

## 2010-08-28 NOTE — Telephone Encounter (Signed)
Pt was in to see Dr Amador Cunas and had a req a cheaper med alternative to lipitor. Pt is req a call back from Dr Vernon Prey nurse.

## 2010-08-29 ENCOUNTER — Encounter: Payer: Self-pay | Admitting: Internal Medicine

## 2010-09-04 ENCOUNTER — Encounter: Payer: Self-pay | Admitting: Internal Medicine

## 2010-11-17 ENCOUNTER — Telehealth: Payer: Self-pay | Admitting: Internal Medicine

## 2010-11-17 NOTE — Telephone Encounter (Signed)
Currently on Plavix. Her ins wants her switch to Clopidogrel. Is this ok? Send to [facility]

## 2010-11-17 NOTE — Telephone Encounter (Signed)
CHECKED WITH PHARMACY - THEY HAVE GENERIC AVILB. AND WE HAVE NOT MARK RX bmn - SO IT CAN BE FILL WITH GENERIC - HAS REFILLS AVILB. kik

## 2010-11-20 ENCOUNTER — Encounter: Payer: Self-pay | Admitting: Internal Medicine

## 2010-11-20 ENCOUNTER — Ambulatory Visit (INDEPENDENT_AMBULATORY_CARE_PROVIDER_SITE_OTHER): Payer: 59 | Admitting: Internal Medicine

## 2010-11-20 VITALS — BP 130/80 | Temp 98.3°F | Wt 141.0 lb

## 2010-11-20 DIAGNOSIS — L259 Unspecified contact dermatitis, unspecified cause: Secondary | ICD-10-CM

## 2010-11-20 DIAGNOSIS — E785 Hyperlipidemia, unspecified: Secondary | ICD-10-CM

## 2010-11-20 DIAGNOSIS — I679 Cerebrovascular disease, unspecified: Secondary | ICD-10-CM

## 2010-11-20 DIAGNOSIS — L309 Dermatitis, unspecified: Secondary | ICD-10-CM

## 2010-11-20 DIAGNOSIS — I10 Essential (primary) hypertension: Secondary | ICD-10-CM

## 2010-11-20 MED ORDER — TRIAMCINOLONE ACETONIDE 0.1 % EX CREA: TOPICAL_CREAM | Freq: Two times a day (BID) | CUTANEOUS | Status: DC

## 2010-11-20 MED ORDER — METHYLPREDNISOLONE ACETATE 80 MG/ML IJ SUSP
80.0000 mg | Freq: Once | INTRAMUSCULAR | Status: AC
  Administered 2010-11-20: 80 mg via INTRAMUSCULAR

## 2010-11-20 NOTE — Patient Instructions (Signed)
Use triamcinolone cream to it 3 times daily  Benadryl 25 mg every 6 hours as needed for itching  Call or return to clinic prn if these symptoms worsen or fail to improve as anticipated.

## 2010-11-20 NOTE — Progress Notes (Signed)
  Subjective:    Patient ID: Erica Young, female    DOB: Mar 09, 1941, 70 y.o.   MRN: [id number]  HPI  70 year old patient who has a history of treated hypertension. She has dyslipidemia and low back pain. She presents today with a chief complaint of a rash involving both hands. She has been exposed to poison ivy and the rash has been quite pruritic. She has been using triamcinolone with only temporary and marginal relief.    Review of Systems  Skin: Positive for rash.       Objective:   Physical Exam  Skin: Rash noted.       Scattered erythematous papules involving both hands especially the fingers          Assessment & Plan:    Content dermatitis. Will treat with Depo-Medrol 80 mg IM. Benadryl recommended. Triamcinolone cream will be refilled.

## 2010-11-24 ENCOUNTER — Telehealth: Payer: Self-pay | Admitting: Internal Medicine

## 2010-11-24 NOTE — Telephone Encounter (Signed)
Pt called and is req refill of losartan (COZAAR) 100 MG tablet 1 a day for 90 day supply. Pt only has 1 pill left. Pls call in to [facility] on [address].

## 2010-11-24 NOTE — Telephone Encounter (Signed)
Called and spoke with pharmacy and pt request 90 day supply.  Pt will now get 90 day supply of losartan.

## 2011-01-22 ENCOUNTER — Telehealth: Payer: Self-pay | Admitting: Internal Medicine

## 2011-01-22 ENCOUNTER — Telehealth: Payer: Self-pay

## 2011-01-22 DIAGNOSIS — M549 Dorsalgia, unspecified: Secondary | ICD-10-CM

## 2011-01-22 NOTE — Telephone Encounter (Signed)
Order placed for epidurial - let me know if you done see order - pt request thei be scheduled asap. KIK

## 2011-01-22 NOTE — Telephone Encounter (Signed)
Opened in error

## 2011-01-22 NOTE — Telephone Encounter (Signed)
Please advise 

## 2011-01-22 NOTE — Telephone Encounter (Signed)
Ok to refer.

## 2011-01-22 NOTE — Telephone Encounter (Signed)
Done - order place

## 2011-01-22 NOTE — Telephone Encounter (Signed)
Pt went to [facility] to get Lumbar Steroid Injs at the beginning of year, but since it has been 6 month since pts last inj, it now requires a referral from pcp in order to get additional injs. Pt is in excruciating pain and needs to get referral asap. If pt needs an ov to eval, pls let pt know.

## 2011-01-23 ENCOUNTER — Telehealth: Payer: Self-pay | Admitting: Family Medicine

## 2011-01-23 NOTE — Telephone Encounter (Signed)
I printed off order and all necessary documentation. Faxed all to [facility], where Duwayne Heck will call & schedule the pt.

## 2011-01-23 NOTE — Telephone Encounter (Signed)
noted 

## 2011-01-30 ENCOUNTER — Other Ambulatory Visit: Payer: 59

## 2011-02-02 ENCOUNTER — Ambulatory Visit
Admission: RE | Admit: 2011-02-02 | Discharge: 2011-02-02 | Disposition: A | Discharge status: Home / Self Care | Payer: 59 | Source: Ambulatory Visit | Attending: Internal Medicine | Admitting: Internal Medicine

## 2011-02-02 DIAGNOSIS — G8929 Other chronic pain: Secondary | ICD-10-CM

## 2011-02-02 MED ORDER — METHYLPREDNISOLONE ACETATE 40 MG/ML INJ SUSP (RADIOLOG
120.0000 mg | Freq: Once | INTRAMUSCULAR | Status: AC
  Administered 2011-02-02: 120 mg via EPIDURAL

## 2011-02-02 MED ORDER — IOHEXOL 180 MG/ML  SOLN
1.0000 mL | Freq: Once | INTRAMUSCULAR | Status: AC | PRN
  Administered 2011-02-02: 1 mL via EPIDURAL

## 2011-02-02 NOTE — Patient Instructions (Signed)

## 2011-02-05 ENCOUNTER — Ambulatory Visit: Payer: 59 | Admitting: Internal Medicine

## 2011-02-06 ENCOUNTER — Other Ambulatory Visit: Payer: Self-pay | Admitting: Internal Medicine

## 2011-02-06 ENCOUNTER — Ambulatory Visit (INDEPENDENT_AMBULATORY_CARE_PROVIDER_SITE_OTHER): Payer: 59 | Admitting: Internal Medicine

## 2011-02-06 ENCOUNTER — Encounter: Payer: Self-pay | Admitting: Internal Medicine

## 2011-02-06 DIAGNOSIS — Z0000 Encounter for general adult medical examination without abnormal findings: Secondary | ICD-10-CM

## 2011-02-06 DIAGNOSIS — K5730 Diverticulosis of large intestine without perforation or abscess without bleeding: Secondary | ICD-10-CM

## 2011-02-06 DIAGNOSIS — E039 Hypothyroidism, unspecified: Secondary | ICD-10-CM

## 2011-02-06 DIAGNOSIS — I10 Essential (primary) hypertension: Secondary | ICD-10-CM

## 2011-02-06 DIAGNOSIS — C50919 Malignant neoplasm of unspecified site of unspecified female breast: Secondary | ICD-10-CM

## 2011-02-06 DIAGNOSIS — Z9889 Other specified postprocedural states: Secondary | ICD-10-CM

## 2011-02-06 DIAGNOSIS — E785 Hyperlipidemia, unspecified: Secondary | ICD-10-CM

## 2011-02-06 DIAGNOSIS — I679 Cerebrovascular disease, unspecified: Secondary | ICD-10-CM

## 2011-02-06 DIAGNOSIS — Z23 Encounter for immunization: Secondary | ICD-10-CM

## 2011-02-06 LAB — CBC WITH DIFFERENTIAL/PLATELET
Basophils Absolute: 0 10*3/uL (ref 0.0–0.1)
Eosinophils Absolute: 0.1 10*3/uL (ref 0.0–0.7)
HCT: 46.7 % — ABNORMAL HIGH (ref 36.0–46.0)
Hemoglobin: 15.6 g/dL — ABNORMAL HIGH (ref 12.0–15.0)
Lymphs Abs: 1.8 10*3/uL (ref 0.7–4.0)
MCHC: 33.3 g/dL (ref 30.0–36.0)
MCV: 100 fl (ref 78.0–100.0)
Monocytes Absolute: 0.6 10*3/uL (ref 0.1–1.0)
Monocytes Relative: 7.7 % (ref 3.0–12.0)
Neutro Abs: 4.8 10*3/uL (ref 1.4–7.7)
Platelets: 171 10*3/uL (ref 150.0–400.0)
RDW: 13.7 % (ref 11.5–14.6)

## 2011-02-06 LAB — COMPREHENSIVE METABOLIC PANEL
ALT: 19 U/L (ref 0–35)
AST: 17 U/L (ref 0–37)
CO2: 27 mEq/L (ref 19–32)
Creatinine, Ser: 0.8 mg/dL (ref 0.4–1.2)
GFR: 72.08 mL/min (ref >60.00)
Sodium: 140 mEq/L (ref 135–145)
Total Bilirubin: 0.8 mg/dL (ref 0.3–1.2)
Total Protein: 7.7 g/dL (ref 6.0–8.3)

## 2011-02-06 LAB — LIPID PANEL
HDL: 81 mg/dL (ref >39.00)
LDL Cholesterol: 99 mg/dL (ref 0–99)
Total CHOL/HDL Ratio: 2

## 2011-02-06 MED ORDER — FLUTICASONE PROPIONATE 50 MCG/ACT NA SUSP: 1.0000 | Freq: Every day | NASAL | Status: DC

## 2011-02-06 MED ORDER — LOSARTAN POTASSIUM 100 MG PO TABS: 100.0000 mg | ORAL_TABLET | Freq: Every day | ORAL | Status: DC

## 2011-02-06 MED ORDER — PRAVASTATIN SODIUM 40 MG PO TABS: 40.0000 mg | ORAL_TABLET | Freq: Every day | ORAL | Status: DC

## 2011-02-06 MED ORDER — LEVOTHYROXINE SODIUM 25 MCG PO TABS: 25.0000 ug | ORAL_TABLET | Freq: Every day | ORAL | Status: DC

## 2011-02-06 MED ORDER — CLOPIDOGREL BISULFATE 75 MG PO TABS: 75.0000 mg | ORAL_TABLET | Freq: Every day | ORAL | Status: DC

## 2011-02-06 NOTE — Patient Instructions (Signed)
Limit your sodium (Salt) intake    It is important that you exercise regularly, at least 20 minutes 3 to 4 times per week.  If you develop chest pain or shortness of breath seek  medical attention.  Take a calcium supplement, plus [phone] units of vitamin D  Schedule your mammogram.  Gynecology referral as discussed

## 2011-02-06 NOTE — Progress Notes (Signed)
Subjective:    Patient ID: Erica Young, female    DOB: [birth year], 70 y.o.   MRN: [id number]  HPI  70 year old patient who is seen today for a preventive health examination. She is doing quite well she has some sinus concerns otherwise asymptomatic. No focal neurological symptoms.  1. Risk factors, based on past  M,S,F history-  cardiovascular risk factors include a history of hypertension and dyslipidemia. She does have a history of cerebrovascular disease  2.  Physical activities: No limitations remains active  3.  Depression/mood: No history of depression or mood disorder  4.  Hearing: No deficits  5.  ADL's: Independent in all aspects of daily living 6.  Fall risk: Low  7.  Home safety: No problems identified  8.  Height weight, and visual acuity; height and weight stable. No change in visual acuity.  9.  Counseling: None appropriate at this time  10. Lab orders based on risk factors: Laboratory profile including TSH and lipid profile will be reviewed 11. Referral : She is status post right breast lumpectomy for breast cancer 5 years ago needs followup mammogram. Needs a gynecologic referral  12. Care plan: Mammogram and GYN referral low-salt diet encouraged as well as regular exercise 13. Cognitive assessment: Alert and oriented with normal affect no cognitive dysfunction     Vitals Entered By: Duard Brady LPN (April 10, 2010 12:56 PM)  CC: pain in leg/back on/off x3 months  Is Patient Diabetic? No  CC: pain in leg/back on/off x3 months.  History of Present Illness:  70 year old patient who presents with a 3 to 4 month history oflow back pain with radiation down the right leg. This was evaluated in [place] state in September that included a lumbar MRI. The patient has a DVD of the lumbar MRI, but failed to bring this with her today. She was hospitalized in October for a right brain stroke. She is scheduled for neurology follow-up next month. Presently, she is on  aspirin and Plavix. Denies any focal neurological symptoms. There is no motor weakness on the right and her only complaint is pain. She did not benefit from physical therapy in [place]. She was told that she might benefit from spinal injections.     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  father died in his early 50s from gastric cancer  mother died of complications of cerebral vascular disease in her early 11s  two brothers 4 sisters-positive for coronary artery disease and cerebral vascular disease   Past Medical History  Diagnosis Date  . HYPOTHYROIDISM 03/03/2010  . HYPERLIPIDEMIA 03/03/2010  . CEREBROVASCULAR DISEASE 03/03/2010  . DIVERTICULOSIS, COLON 03/03/2010  . Low back pain   . Right leg pain   . History of cerebral artery stenosis     right middle  . Cancer     right breat ca  , lumpectomy and radiation tx (declined chemo and additional prophylactic meds)  . Mastoiditis     noted on brain MRI  . Foramen ovale  positive bubble study   Past Surgical History  Procedure Date  . Cholecystectomy   . Breast lumpectomy   . Tonsillectomy     reports that she has quit smoking. She does not have any smokeless tobacco history on file. Her alcohol and drug histories not on file. family history includes Heart disease in her brothers and sisters. No Known Allergies   Review of Systems  Constitutional: Negative for fever, appetite change, fatigue and unexpected weight  change.  HENT: Negative for hearing loss, ear pain, nosebleeds, congestion, sore throat, mouth sores, trouble swallowing, neck stiffness, dental problem, voice change, sinus pressure and tinnitus.   Eyes: Negative for photophobia, pain, redness and visual disturbance.  Respiratory: Negative for cough, chest tightness and shortness of breath.   Cardiovascular: Negative for chest pain, palpitations and leg swelling.  Gastrointestinal: Negative for nausea, vomiting, abdominal pain, diarrhea, constipation, blood in stool, abdominal distention and rectal pain.  Genitourinary: Negative for dysuria, urgency, frequency, hematuria, flank pain, vaginal bleeding, vaginal discharge, difficulty urinating, genital sores, vaginal pain, menstrual problem and pelvic pain.  Musculoskeletal: Negative for back pain and arthralgias.  Skin: Negative for rash.  Neurological: Negative for dizziness, syncope, speech difficulty, weakness, light-headedness, numbness and headaches.  Hematological: Negative for adenopathy. Does not bruise/bleed easily.  Psychiatric/Behavioral: Negative for suicidal ideas, behavioral problems, self-injury, dysphoric mood and agitation. The patient is not nervous/anxious.        Objective:   Physical Exam  Constitutional: She is oriented to person, place, and time. She appears well-developed and well-nourished.  HENT:  Head: Normocephalic and atraumatic.  Right Ear: External ear normal.  Left Ear: External ear normal.  Mouth/Throat: Oropharynx is clear and moist.  Eyes: Conjunctivae and EOM are normal.  Neck: Normal range of motion. Neck supple. No JVD present. No thyromegaly present.  Cardiovascular: Normal rate, regular rhythm, normal heart sounds and intact distal pulses.   No murmur heard. Pulmonary/Chest: Effort normal and breath sounds normal. She has no wheezes. She has no rales.       Incision right lateral breast well-healed. Breast exam unremarkable. Axilla clear  Abdominal:  Soft. Bowel sounds are normal. She exhibits no distension and no mass. There is no tenderness. There is no rebound and no guarding.  Genitourinary: Vagina normal.  Musculoskeletal: Normal range of motion. She exhibits no edema and no tenderness.  Neurological: She is alert and oriented to person, place, and time. She has normal reflexes. No cranial nerve deficit. She exhibits normal muscle tone. Coordination normal.  Skin: Skin is warm and dry. No rash noted.  Psychiatric: She has a normal mood and affect. Her behavior is normal.          Assessment & Plan:   Preventive health. We'll set up for mammogram and gynecologic referral Hypertension well controlled Dyslipidemia. We'll check a lipid profile  EKG  Reviewed- WNL

## 2011-02-13 ENCOUNTER — Telehealth: Payer: Self-pay | Admitting: *Deleted

## 2011-02-13 NOTE — Telephone Encounter (Signed)
Attempt to call - VM - LMTCB if questions labs normal - copy ready for pick up

## 2011-02-13 NOTE — Telephone Encounter (Signed)
Please advise 

## 2011-02-13 NOTE — Telephone Encounter (Signed)
Pt would like her lab results, and to pick up copies also.  Please call her.

## 2011-02-13 NOTE — Telephone Encounter (Signed)
Please call/notify patient that lab/test/procedure is normal OK for lab copy

## 2011-02-19 ENCOUNTER — Other Ambulatory Visit: Payer: Self-pay | Admitting: Internal Medicine

## 2011-02-19 ENCOUNTER — Telehealth: Payer: Self-pay | Admitting: Family Medicine

## 2011-02-19 NOTE — Telephone Encounter (Signed)
Pulled off Triage VM. Pt wants someone to call her back. She left no other info on message.

## 2011-02-19 NOTE — Telephone Encounter (Signed)
Spoke with pt - poison ivy - itching , taking benadryl - ok to use triamcinolone - if no improvement - to see for possible injection

## 2011-04-05 ENCOUNTER — Telehealth: Payer: Self-pay | Admitting: Internal Medicine

## 2011-04-05 NOTE — Telephone Encounter (Signed)
Spoke to pt- no injury. They are painful. They are hard and warm to the touch. No sob. They are all over both thighs more right than left.

## 2011-04-05 NOTE — Telephone Encounter (Signed)
Pt called and said that she has bruising all over pts thighs. Pt believes that this is being caused by  clopidogrel (PLAVIX) 75 MG tablet . Pt req work in [employer] for tomorrow.

## 2011-04-05 NOTE — Telephone Encounter (Signed)
Per Dr. Kirtland Bouchard- have pt stop asa and plavix and he will see her tomorrow. Appt made

## 2011-04-06 ENCOUNTER — Ambulatory Visit (INDEPENDENT_AMBULATORY_CARE_PROVIDER_SITE_OTHER): Payer: 59 | Admitting: Internal Medicine

## 2011-04-06 ENCOUNTER — Encounter: Payer: Self-pay | Admitting: Internal Medicine

## 2011-04-06 DIAGNOSIS — E785 Hyperlipidemia, unspecified: Secondary | ICD-10-CM

## 2011-04-06 DIAGNOSIS — I10 Essential (primary) hypertension: Secondary | ICD-10-CM

## 2011-04-06 DIAGNOSIS — I679 Cerebrovascular disease, unspecified: Secondary | ICD-10-CM

## 2011-04-06 MED ORDER — ASPIRIN EC 81 MG PO TBEC: 81.0000 mg | DELAYED_RELEASE_TABLET | Freq: Every day | ORAL | Status: AC

## 2011-04-06 NOTE — Progress Notes (Signed)
  Subjective:    Patient ID: Erica Young, female    DOB: Dec 13, 1940, 70 y.o.   MRN: [id number]  HPI  70 year old patient who has a history of cerebrovascular disease. She has been on Plavix and on the past several days has had extensive bruising involving primarily the anterior thigh areas bilaterally;  she has developed some painful hematoma. Denies any melena or signs of GI bleeding. Denies any abdominal pain. She has also self titrated Pravachol to 20 mg due to concerns of side effects    Review of Systems  Skin: Positive for rash.       Objective:   Physical Exam  Constitutional: She appears well-developed and well-nourished. No distress.       Blood pressure 110/70. No distress  Skin:       Extensive ecchymoses over the anterior thighs          Assessment & Plan:   Ecchymoses of the anterior thighs bilaterally. Plavix has been held since yesterday. This will be discontinued after one week if there is no further bleeding will start baby aspirin 81 mg daily. She will report any further bleeding

## 2011-04-06 NOTE — Patient Instructions (Signed)
Discontinue Plavix  In 1 week start aspirin 81 mg daily

## 2011-05-02 ENCOUNTER — Other Ambulatory Visit: Payer: Self-pay | Admitting: Obstetrics and Gynecology

## 2011-05-02 DIAGNOSIS — Z9889 Other specified postprocedural states: Secondary | ICD-10-CM

## 2011-05-02 DIAGNOSIS — C50919 Malignant neoplasm of unspecified site of unspecified female breast: Secondary | ICD-10-CM

## 2011-05-07 ENCOUNTER — Ambulatory Visit (INDEPENDENT_AMBULATORY_CARE_PROVIDER_SITE_OTHER): Payer: 59 | Admitting: Internal Medicine

## 2011-05-07 ENCOUNTER — Telehealth: Payer: Self-pay | Admitting: *Deleted

## 2011-05-07 DIAGNOSIS — R109 Unspecified abdominal pain: Secondary | ICD-10-CM | POA: Insufficient documentation

## 2011-05-07 MED ORDER — METRONIDAZOLE 500 MG PO TABS: 500.0000 mg | ORAL_TABLET | Freq: Three times a day (TID) | ORAL | Status: AC

## 2011-05-07 MED ORDER — CIPROFLOXACIN HCL 500 MG PO TABS: 500.0000 mg | ORAL_TABLET | Freq: Two times a day (BID) | ORAL | Status: AC

## 2011-05-07 NOTE — Progress Notes (Signed)
Subjective:    Patient ID: Erica Young, female    DOB: [birth year], 71 y.o.   MRN: [id number]  Abdominal Pain This is a new problem. The current episode started yesterday. The onset quality is sudden. The problem occurs intermittently. The problem has been unchanged. The pain is located in the LLQ. The pain is moderate. The quality of the pain is cramping and a sensation of fullness. The abdominal pain does not radiate. Associated symptoms include nausea. Pertinent negatives include no diarrhea, dysuria, fever, hematochezia, melena or vomiting. She has tried nothing for the symptoms.   She reports her symptoms similar to previous episodes of diverticulitis.   Review of Systems  Constitutional: Negative for fever.  Gastrointestinal: Positive for nausea and abdominal pain. Negative for vomiting, diarrhea, melena and hematochezia.  Genitourinary: Negative for dysuria.       Past Medical History  Diagnosis Date  . HYPOTHYROIDISM 03/03/2010  . HYPERLIPIDEMIA 03/03/2010  . CEREBROVASCULAR DISEASE 03/03/2010  . DIVERTICULOSIS, COLON 03/03/2010  . Low back pain   . Right leg pain   . History of cerebral artery stenosis     right middle  . Cancer     right breat ca  , lumpectomy and radiation tx (declined chemo and additional prophylactic meds)  . Mastoiditis     noted on brain MRI  . Foramen ovale     positive bubble study    History   Social History  . Marital Status: Married    Spouse Name: N/A    Number of Children: N/A  . Years of Education: N/A   Occupational History  . Not on file.   Social History Main Topics  . Smoking status: Former Games developer  . Smokeless tobacco: Not on file  . Alcohol Use:   . Drug Use:   . Sexually Active:    Other Topics Concern  . Not on file   Social History Narrative  . No narrative on file    Past Surgical History  Procedure Date  . Cholecystectomy   . Breast lumpectomy   . Tonsillectomy     Family History  Problem Relation Age of  Onset  . Heart disease Sister     cerebral vascular disease also  . Heart disease Brother     cerebral vascular disease also  . Heart disease Brother     cerebral vascular disease  . Heart disease Sister     cebreal vascular disease also  . Heart disease Sister     cebreal vascular diease also  . Heart disease Sister     cerebral vascular diease also    No Known Allergies  Current Outpatient Prescriptions on File Prior to Visit  Medication Sig Dispense Refill  . Ascorbic Acid (VITAMIN C) 500 MG tablet Take 500 mg by mouth daily.        Marland Kitchen aspirin EC 81 MG tablet Take 1 tablet (81 mg total) by mouth daily.  150 tablet  2  . Calcium-Vitamin D (CALTRATE 600 PLUS-VIT D PO) Take 1 capsule by mouth daily.        Marland Kitchen levothyroxine (SYNTHROID, LEVOTHROID) 25 MCG tablet Take 1 tablet (25 mcg total) by mouth daily.  90 tablet  3  . losartan (COZAAR) 100 MG tablet Take 1 tablet (100 mg total) by mouth daily.  30 tablet  11  . pravastatin (PRAVACHOL) 40 MG tablet Take 1 tablet (40 mg total) by mouth daily.  90 tablet  3    BP 114/80  Temp(Src)  97.2 F (36.2 C) (Oral)  Wt 140 lb (63.504 kg)    Objective:   Physical Exam  Constitutional: She is oriented to person, place, and time. She appears well-developed and well-nourished.  Cardiovascular: Normal rate, regular rhythm and normal heart sounds.   Pulmonary/Chest: Effort normal and breath sounds normal. She has no wheezes. She has no rales.  Abdominal: Soft. Bowel sounds are normal. There is no rebound and no guarding.       Lower abdominal tenderness  Neurological: She is alert and oriented to person, place, and time.  Skin: Skin is warm and dry.  Psychiatric: She has a normal mood and affect. Her behavior is normal.          Assessment & Plan:

## 2011-05-07 NOTE — Patient Instructions (Signed)
Please call our office if your symptoms do not improve or gets worse.  

## 2011-05-07 NOTE — Telephone Encounter (Signed)
Husband calls stating pt is complaining of severe abd pain, and believes she is having acute diverticulitis.  Will see Dr. Artist Pais this pm.

## 2011-05-07 NOTE — Assessment & Plan Note (Signed)
71 year old white female with signs and symptoms of possible diverticulitis. Empiric treatment with Cipro and Flagyl x7 days. Patient advised to call office if symptoms persist or worsen.

## 2011-05-21 ENCOUNTER — Ambulatory Visit (INDEPENDENT_AMBULATORY_CARE_PROVIDER_SITE_OTHER): Payer: 59 | Admitting: Internal Medicine

## 2011-05-21 ENCOUNTER — Encounter: Payer: Self-pay | Admitting: Internal Medicine

## 2011-05-21 DIAGNOSIS — E785 Hyperlipidemia, unspecified: Secondary | ICD-10-CM

## 2011-05-21 DIAGNOSIS — E039 Hypothyroidism, unspecified: Secondary | ICD-10-CM

## 2011-05-21 DIAGNOSIS — I10 Essential (primary) hypertension: Secondary | ICD-10-CM

## 2011-05-21 DIAGNOSIS — K5730 Diverticulosis of large intestine without perforation or abscess without bleeding: Secondary | ICD-10-CM

## 2011-05-21 DIAGNOSIS — I679 Cerebrovascular disease, unspecified: Secondary | ICD-10-CM

## 2011-05-21 MED ORDER — ATORVASTATIN CALCIUM 20 MG PO TABS: 20.0000 mg | ORAL_TABLET | Freq: Every day | ORAL | Status: DC

## 2011-05-21 NOTE — Patient Instructions (Signed)
Followup in the spring as previously scheduled    It is important that you exercise regularly, at least 20 minutes 3 to 4 times per week.  If you develop chest pain or shortness of breath seek  medical attention.  Take a calcium supplement, plus [phone] units of vitamin D  High fiber diet

## 2011-05-21 NOTE — Progress Notes (Signed)
  Subjective:    Patient ID: Erica Young, female    DOB: [birth year], 71 y.o.   MRN: [id number]  HPI  71 year old patient who has a history of diverticulosis who was treated recently for acute diverticulitis. She states that she had a very difficult time completing her course of antibiotics due to weakness flushing dizziness but did complete antibiotic therapy with both Cipro and metronidazole. Her abdominal pain has resolved. She has a history of hypothyroidism but on closer review apparently was placed on low-dose Synthroid due to hair loss from a physician in [place]. She has self discontinued thyroid supplementation and is scheduled for followup here in 2 months She has treated hypertension which has been stable She has dyslipidemia and is discontinued Pravachol due to muscle pain she had been on Lipitor in the past which was discontinued due to cost considerations she tolerated this medication well    Review of Systems  Constitutional: Negative.   HENT: Negative for hearing loss, congestion, sore throat, rhinorrhea, dental problem, sinus pressure and tinnitus.   Eyes: Negative for pain, discharge and visual disturbance.  Respiratory: Negative for cough and shortness of breath.   Cardiovascular: Negative for chest pain, palpitations and leg swelling.  Gastrointestinal: Positive for abdominal pain. Negative for nausea, vomiting, diarrhea, constipation, blood in stool and abdominal distention.  Genitourinary: Negative for dysuria, urgency, frequency, hematuria, flank pain, vaginal bleeding, vaginal discharge, difficulty urinating, vaginal pain and pelvic pain.  Musculoskeletal: Positive for myalgias. Negative for joint swelling, arthralgias and gait problem.  Skin: Negative for rash.  Neurological: Negative for dizziness, syncope, speech difficulty, weakness, numbness and headaches.  Hematological: Negative for adenopathy.  Psychiatric/Behavioral: Negative for behavioral problems, dysphoric  mood and agitation. The patient is not nervous/anxious.        Objective:   Physical Exam  Constitutional: She is oriented to person, place, and time. She appears well-developed and well-nourished.  HENT:  Head: Normocephalic.  Right Ear: External ear normal.  Left Ear: External ear normal.  Mouth/Throat: Oropharynx is clear and moist.  Eyes: Conjunctivae and EOM are normal. Pupils are equal, round, and reactive to light.  Neck: Normal range of motion. Neck supple. No thyromegaly present.  Cardiovascular: Normal rate, regular rhythm, normal heart sounds and intact distal pulses.   Pulmonary/Chest: Effort normal and breath sounds normal.  Abdominal: Soft. Bowel sounds are normal. She exhibits no mass. There is tenderness.       Very minimal right lower quadrant pain. Left lower quadrant tenderness has resolved  Musculoskeletal: Normal range of motion.  Lymphadenopathy:    She has no cervical adenopathy.  Neurological: She is alert and oriented to person, place, and time.  Skin: Skin is warm and dry. No rash noted.  Psychiatric: She has a normal mood and affect. Her behavior is normal.          Assessment & Plan:   Status post acute diverticulitis. Patient had a difficult time tolerating antibiotics might need to consider alternative treatment or perhaps a lesser dose if she has recurrence in the future History of hypothyroidism. It seems that she was placed on low-dose supplemental thyroid medication due to hair loss will continue off medication and recheck a TSH at the next office visit Dyslipidemia. She has been intolerant of Pravachol will resume Lipitor

## 2011-07-16 ENCOUNTER — Telehealth: Payer: Self-pay

## 2011-07-16 NOTE — Telephone Encounter (Signed)
Pt sates she has an appt on 08/07/11 for a 6 month f/u and pt would like to know if she needs to have lab work done before her appt so she can discuss them with Dr. Amador Cunas.  Pls advise.

## 2011-07-16 NOTE — Telephone Encounter (Signed)
Spoke with pt- she has 6 mos rov - in April - not cpx- this will be done in oct or later.

## 2011-08-03 ENCOUNTER — Telehealth: Payer: Self-pay | Admitting: Internal Medicine

## 2011-08-03 NOTE — Telephone Encounter (Signed)
Pt called and is req to get lab work done prior to 44month fup, because of cholesterol med she is on and pain she has been experiencing in upper rt abd. Pt is sch to come in on 08/07/11. Pls adv. Pt has been informed that if she wants labs done prior to her 6 month fup, her ov for tues would need to be rsch so labs could get back in time.

## 2011-08-03 NOTE — Telephone Encounter (Signed)
Attempt to call - VM - LMTCB if questions - appt next wk at 1030 - if comes in fasting dr. Amador Cunas will probably do at that time.

## 2011-08-07 ENCOUNTER — Encounter: Payer: Self-pay | Admitting: Internal Medicine

## 2011-08-07 ENCOUNTER — Ambulatory Visit (INDEPENDENT_AMBULATORY_CARE_PROVIDER_SITE_OTHER): Payer: 59 | Admitting: Internal Medicine

## 2011-08-07 VITALS — BP 120/80 | Temp 97.6°F | Wt 142.0 lb

## 2011-08-07 DIAGNOSIS — E039 Hypothyroidism, unspecified: Secondary | ICD-10-CM

## 2011-08-07 DIAGNOSIS — R109 Unspecified abdominal pain: Secondary | ICD-10-CM

## 2011-08-07 DIAGNOSIS — I10 Essential (primary) hypertension: Secondary | ICD-10-CM

## 2011-08-07 DIAGNOSIS — E785 Hyperlipidemia, unspecified: Secondary | ICD-10-CM

## 2011-08-07 LAB — COMPREHENSIVE METABOLIC PANEL
AST: 19 U/L (ref 0–37)
Albumin: 4 g/dL (ref 3.5–5.2)
Alkaline Phosphatase: 66 U/L (ref 39–117)
BUN: 17 mg/dL (ref 6–23)
Potassium: 4.1 mEq/L (ref 3.5–5.1)
Sodium: 141 mEq/L (ref 135–145)

## 2011-08-07 MED ORDER — ATORVASTATIN CALCIUM 10 MG PO TABS: 10.0000 mg | ORAL_TABLET | Freq: Every day | ORAL | Status: DC

## 2011-08-07 NOTE — Progress Notes (Signed)
  Subjective:    Patient ID: Erica Young, female    DOB: [birth year], 71 y.o.   MRN: [id number]  HPI  71 year old patient who is seen today for her 6 month followup. She has a history of treated hypothyroidism as well as dyslipidemia. She is now on Lipitor 20 mg daily. She has been intolerant of pravastatin in the past She seems to be doing quite well although she does complain of some intermittent right upper quadrant pain and nausea this was fairly mild over the past one or 2 months. She has had a remote cholecystectomy. She has hypertension which has been controlled. She has a history of diverticular disease.    Review of Systems  Constitutional: Negative.   HENT: Negative for hearing loss, congestion, sore throat, rhinorrhea, dental problem, sinus pressure and tinnitus.   Eyes: Negative for pain, discharge and visual disturbance.  Respiratory: Negative for cough and shortness of breath.   Cardiovascular: Negative for chest pain, palpitations and leg swelling.  Gastrointestinal: Positive for nausea and abdominal pain. Negative for vomiting, diarrhea, constipation, blood in stool and abdominal distention.  Genitourinary: Negative for dysuria, urgency, frequency, hematuria, flank pain, vaginal bleeding, vaginal discharge, difficulty urinating, vaginal pain and pelvic pain.  Musculoskeletal: Negative for joint swelling, arthralgias and gait problem.  Skin: Negative for rash.  Neurological: Negative for dizziness, syncope, speech difficulty, weakness, numbness and headaches.  Hematological: Negative for adenopathy.  Psychiatric/Behavioral: Negative for behavioral problems, dysphoric mood and agitation. The patient is not nervous/anxious.        Objective:   Physical Exam  Constitutional: She is oriented to person, place, and time. She appears well-developed and well-nourished.  HENT:  Head: Normocephalic.  Right Ear: External ear normal.  Left Ear: External ear normal.  Mouth/Throat:  Oropharynx is clear and moist.  Eyes: Conjunctivae and EOM are normal. Pupils are equal, round, and reactive to light.  Neck: Normal range of motion. Neck supple. No thyromegaly present.  Cardiovascular: Normal rate, regular rhythm, normal heart sounds and intact distal pulses.   Pulmonary/Chest: Effort normal and breath sounds normal.  Abdominal: Soft. Bowel sounds are normal. She exhibits no distension and no mass. There is no tenderness. There is no rebound and no guarding.  Musculoskeletal: Normal range of motion.  Lymphadenopathy:    She has no cervical adenopathy.  Neurological: She is alert and oriented to person, place, and time.  Skin: Skin is warm and dry. No rash noted.  Psychiatric: She has a normal mood and affect. Her behavior is normal.          Assessment & Plan:   Hypertension well controlled. We'll continue Cozaar Nonspecific abdominal pain. We'll check a a Cmet Dyslipidemia. Will continue atorvastatin  We'll see in 6 months for complete physical otherwise return when necessary if her abdominal pain does not resolve

## 2011-08-07 NOTE — Patient Instructions (Signed)
Limit your sodium (Salt) intake    It is important that you exercise regularly, at least 20 minutes 3 to 4 times per week.  If you develop chest pain or shortness of breath seek  medical attention.  Call or return to clinic prn if these symptoms worsen or fail to improve as anticipated.  Return in 6 months for follow-up  

## 2011-08-25 ENCOUNTER — Other Ambulatory Visit: Payer: Self-pay | Admitting: Internal Medicine

## 2011-08-27 ENCOUNTER — Telehealth: Payer: Self-pay | Admitting: Internal Medicine

## 2011-08-27 NOTE — Telephone Encounter (Signed)
Pt checking status of  refill on losartan (COZAAR) 100 MG tablet Refill request in system from pharmacy

## 2011-08-27 NOTE — Telephone Encounter (Signed)
done

## 2011-09-10 ENCOUNTER — Ambulatory Visit (INDEPENDENT_AMBULATORY_CARE_PROVIDER_SITE_OTHER): Payer: 59 | Admitting: Internal Medicine

## 2011-09-10 ENCOUNTER — Encounter: Payer: Self-pay | Admitting: Internal Medicine

## 2011-09-10 VITALS — BP 100/70 | Temp 97.7°F | Wt 138.0 lb

## 2011-09-10 DIAGNOSIS — K5730 Diverticulosis of large intestine without perforation or abscess without bleeding: Secondary | ICD-10-CM

## 2011-09-10 DIAGNOSIS — R109 Unspecified abdominal pain: Secondary | ICD-10-CM

## 2011-09-10 DIAGNOSIS — I10 Essential (primary) hypertension: Secondary | ICD-10-CM

## 2011-09-10 MED ORDER — CIPROFLOXACIN HCL 500 MG PO TABS: 500.0000 mg | ORAL_TABLET | Freq: Two times a day (BID) | ORAL | Status: AC

## 2011-09-10 NOTE — Patient Instructions (Signed)
Advance diet as tolerated  Call or return to clinic prn if these symptoms worsen or fail to improve as anticipated. Diverticulitis A diverticulum is a small pouch or sac on the colon. Diverticulosis is the presence of these diverticula on the colon. Diverticulitis is the irritation (inflammation) or infection of diverticula. CAUSES   The colon and its diverticula contain bacteria. If food particles block the tiny opening to a diverticulum, the bacteria inside can grow and cause an increase in pressure. This leads to infection and inflammation and is called diverticulitis. SYMPTOMS    Abdominal pain and tenderness. Usually, the pain is located on the left side of your abdomen. However, it could be located elsewhere.   Fever.   Bloating.   Feeling sick to your stomach (nausea).   Throwing up (vomiting).   Abnormal stools.  DIAGNOSIS   Your caregiver will take a history and perform a physical exam. Since many things can cause abdominal pain, other tests may be necessary. Tests may include:  Blood tests.   Urine tests.   X-ray of the abdomen.   CT scan of the abdomen.  Sometimes, surgery is needed to determine if diverticulitis or other conditions are causing your symptoms. TREATMENT   Most of the time, you can be treated without surgery. Treatment includes:  Resting the bowels by only having liquids for a few days. As you improve, you will need to eat a low-fiber diet.   Intravenous (IV) fluids if you are losing body fluids (dehydrated).   Antibiotic medicines that treat infections may be given.   Pain and nausea medicine, if needed.   Surgery if the inflamed diverticulum has burst.  HOME CARE INSTRUCTIONS    Try a clear liquid diet (broth, tea, or water for as long as directed by your caregiver). You may then gradually begin a low-fiber diet as tolerated. A low-fiber diet is a diet with less than 10 grams of fiber. Choose the foods below to reduce fiber in the diet:    White breads, cereals, rice, and pasta.   Cooked fruits and vegetables or soft fresh fruits and vegetables without the skin.   Ground or well-cooked tender beef, ham, veal, lamb, pork, or poultry.   Eggs and seafood.   After your diverticulitis symptoms have improved, your caregiver may put you on a high-fiber diet. A high-fiber diet includes 14 grams of fiber for every 1000 calories consumed. For a standard 2000 calorie diet, you would need 28 grams of fiber. Follow these diet guidelines to help you increase the fiber in your diet. It is important to slowly increase the amount fiber in your diet to avoid gas, constipation, and bloating.   Choose whole-grain breads, cereals, pasta, and brown rice.   Choose fresh fruits and vegetables with the skin on. Do not overcook vegetables because the more vegetables are cooked, the more fiber is lost.   Choose more nuts, seeds, legumes, dried peas, beans, and lentils.   Look for food products that have greater than 3 grams of fiber per serving on the Nutrition Facts label.   Take all medicine as directed by your caregiver.   If your caregiver has given you a follow-up appointment, it is very important that you go. Not going could result in lasting (chronic) or permanent injury, pain, and disability. If there is any problem keeping the appointment, call to reschedule.  SEEK MEDICAL CARE IF:    Your pain does not improve.   You have a hard time advancing  your diet beyond clear liquids.   Your bowel movements do not return to normal.  SEEK IMMEDIATE MEDICAL CARE IF:    Your pain becomes worse.   You have an oral temperature above 102 F (38.9 C), not controlled by medicine.   You have repeated vomiting.   You have bloody or black, tarry stools.   Symptoms that brought you to your caregiver become worse or are not getting better.  MAKE SURE YOU:    Understand these instructions.   Will watch your condition.   Will get help right  away if you are not doing well or get worse.  Document Released: 01/24/2005 Document Revised: 04/05/2011 Document Reviewed: 05/22/2010 [facility] Patient Information 2012 [place], [place].

## 2011-09-10 NOTE — Progress Notes (Signed)
  Subjective:    Patient ID: Erica Young, female    DOB: [birth year], 71 y.o.   MRN: [id number]  HPI  71 year old patient who presents with two-day history of lower abdominal pain. This has been associated with a single episode of hematochezia. She does have a history of documented diverticulitis in the past there's been no fever no nausea vomiting or diarrhea. Approximately 4 months ago she was treated for a diverticular flare with 2 antibiotics which she tolerated poorly. She states this has been especially treated with a single antibiotic in the past. She is very reluctant to consider dual antibiotic therapy.    Review of Systems  Constitutional: Negative.   HENT: Negative for hearing loss, congestion, sore throat, rhinorrhea, dental problem, sinus pressure and tinnitus.   Eyes: Negative for pain, discharge and visual disturbance.  Respiratory: Negative for cough and shortness of breath.   Cardiovascular: Negative for chest pain, palpitations and leg swelling.  Gastrointestinal: Positive for abdominal pain and blood in stool. Negative for nausea, vomiting, diarrhea, constipation and abdominal distention.  Genitourinary: Negative for dysuria, urgency, frequency, hematuria, flank pain, vaginal bleeding, vaginal discharge, difficulty urinating, vaginal pain and pelvic pain.  Musculoskeletal: Negative for joint swelling, arthralgias and gait problem.  Skin: Negative for rash.  Neurological: Negative for dizziness, syncope, speech difficulty, weakness, numbness and headaches.  Hematological: Negative for adenopathy.  Psychiatric/Behavioral: Negative for behavioral problems, dysphoric mood and agitation. The patient is not nervous/anxious.        Objective:   Physical Exam  Constitutional: She is oriented to person, place, and time. She appears well-developed and well-nourished.  HENT:  Head: Normocephalic.  Right Ear: External ear normal.  Left Ear: External ear normal.  Mouth/Throat:  Oropharynx is clear and moist.  Eyes: Conjunctivae and EOM are normal. Pupils are equal, round, and reactive to light.  Neck: Normal range of motion. Neck supple. No thyromegaly present.  Cardiovascular: Normal rate, regular rhythm, normal heart sounds and intact distal pulses.   Pulmonary/Chest: Effort normal and breath sounds normal.  Abdominal: Soft. Bowel sounds are normal. She exhibits no distension and no mass. There is tenderness. There is no rebound and no guarding.       Bowel sounds are quite active No guarding or significant rebound tenderness noted Mild tenderness in the lower quadrants most marked on the left  Musculoskeletal: Normal range of motion.  Lymphadenopathy:    She has no cervical adenopathy.  Neurological: She is alert and oriented to person, place, and time.  Skin: Skin is warm and dry. No rash noted.  Psychiatric: She has a normal mood and affect. Her behavior is normal.          Assessment & Plan:   Abdominal pain history of diverticulosis. Probable mild diverticular flare. She did very poorly with dual antibiotic therapy will treat with Cipro only 500 twice a day and observe. She declines any pain medication

## 2011-09-11 ENCOUNTER — Telehealth: Payer: Self-pay | Admitting: Internal Medicine

## 2011-09-11 MED ORDER — METRONIDAZOLE 500 MG PO TABS: 500.0000 mg | ORAL_TABLET | Freq: Three times a day (TID) | ORAL | Status: AC

## 2011-09-11 NOTE — Telephone Encounter (Signed)
Pt hus is aware another med will be call into pharm

## 2011-09-11 NOTE — Telephone Encounter (Signed)
Discontinue Cipro and mark chart as allergic  Please call in prescription for metronidazole 500 mg #30 one 3 times a day

## 2011-09-11 NOTE — Telephone Encounter (Signed)
Spoke with pt- allergic reaction to cipro - would like to have different abx sent to pharmacy - encouraged to come in - declined .  Please advise

## 2011-09-11 NOTE — Telephone Encounter (Signed)
New med sent to [facility] - cipro to allergy list

## 2011-09-11 NOTE — Telephone Encounter (Signed)
Pts spouse called and said that pt is not responding well to med that she was given yesterday. Pt took 1 pill last night and starting having sob, difficulty breathing, swollen tongue and lips, headaces. Pts spouse almost took pt to ED last night, but side effects subsided some. Pt is doing a little better this morning, but refuses to take anymore of the med. Need to know what course of action she could take?

## 2011-12-07 ENCOUNTER — Telehealth: Payer: Self-pay | Admitting: Internal Medicine

## 2011-12-07 NOTE — Telephone Encounter (Signed)
Caller: Erica Young/Patient; PCP: Eleonore Chiquito;  Calling To See If She Can Have Referral To Gastroenterology. Hx To Diverticulitis;  She takes fiber daily and drinks plenty of water and eats well. She often gets pain in rectum and is concerned- No sx at the present time. PLEASE CALL BACK WITH REFERRAL. PREFERS FEMALE MD IF POSSIBLE. CB#: [phone]   PCP Calls- no triage.

## 2011-12-11 ENCOUNTER — Telehealth: Payer: Self-pay | Admitting: Internal Medicine

## 2011-12-11 NOTE — Telephone Encounter (Signed)
called pt - not having any severe sx - no diarrhea , alittle abd pain - "i know my body and I want to see a specialist" I explained I would talk with another provider- see what they say - but would probably need to be seen to be referred.  I spoke with dr. Clent Ridges - would need to be seen - another provider cant make referral with out eval here first - they are unfamiliar with pt .    appt made for tomorrow

## 2011-12-11 NOTE — Telephone Encounter (Signed)
appt made to see another provider tomorrow to eval before GI referral - see additional phone call in notes

## 2011-12-11 NOTE — Telephone Encounter (Signed)
Pt called [facility] on Friday requesting a referral to GI. Pt called wondering why she has not been contacted yet. Pt aware that doctor is out of the office till Monday. Pt stated that she can not wait to Monday. Please contact pt ASAp

## 2011-12-12 ENCOUNTER — Ambulatory Visit: Payer: 59 | Admitting: Family Medicine

## 2011-12-12 ENCOUNTER — Encounter: Payer: Self-pay | Admitting: Internal Medicine

## 2012-01-16 ENCOUNTER — Encounter: Payer: Self-pay | Admitting: *Deleted

## 2012-01-29 ENCOUNTER — Other Ambulatory Visit (INDEPENDENT_AMBULATORY_CARE_PROVIDER_SITE_OTHER): Payer: 59

## 2012-01-29 DIAGNOSIS — R109 Unspecified abdominal pain: Secondary | ICD-10-CM

## 2012-01-29 DIAGNOSIS — Z0000 Encounter for general adult medical examination without abnormal findings: Secondary | ICD-10-CM

## 2012-01-29 DIAGNOSIS — I10 Essential (primary) hypertension: Secondary | ICD-10-CM

## 2012-01-29 LAB — POCT URINALYSIS DIPSTICK
Nitrite, UA: NEGATIVE
Urobilinogen, UA: 0.2
pH, UA: 6.5

## 2012-01-29 LAB — CBC WITH DIFFERENTIAL/PLATELET
Basophils Absolute: 0 10*3/uL (ref 0.0–0.1)
Eosinophils Relative: 5.3 % — ABNORMAL HIGH (ref 0.0–5.0)
HCT: 42.5 % (ref 36.0–46.0)
Hemoglobin: 14.1 g/dL (ref 12.0–15.0)
Lymphs Abs: 2 10*3/uL (ref 0.7–4.0)
MCV: 98.8 fl (ref 78.0–100.0)
Monocytes Absolute: 0.4 10*3/uL (ref 0.1–1.0)
Monocytes Relative: 6.6 % (ref 3.0–12.0)
Neutro Abs: 2.7 10*3/uL (ref 1.4–7.7)
RDW: 13.3 % (ref 11.5–14.6)

## 2012-01-29 LAB — HEPATIC FUNCTION PANEL
ALT: 15 U/L (ref 0–35)
AST: 17 U/L (ref 0–37)
Albumin: 3.9 g/dL (ref 3.5–5.2)

## 2012-01-29 LAB — BASIC METABOLIC PANEL
Chloride: 104 mEq/L (ref 96–112)
GFR: 78.38 mL/min (ref >60.00)
Glucose, Bld: 97 mg/dL (ref 70–99)
Potassium: 4.2 mEq/L (ref 3.5–5.1)
Sodium: 137 mEq/L (ref 135–145)

## 2012-01-29 LAB — TSH: TSH: 6.48 u[IU]/mL — ABNORMAL HIGH (ref 0.35–5.50)

## 2012-01-29 LAB — LDL CHOLESTEROL, DIRECT: Direct LDL: 122.8 mg/dL

## 2012-02-07 ENCOUNTER — Encounter: Payer: Self-pay | Admitting: Internal Medicine

## 2012-02-07 ENCOUNTER — Ambulatory Visit (INDEPENDENT_AMBULATORY_CARE_PROVIDER_SITE_OTHER): Payer: 59 | Admitting: Internal Medicine

## 2012-02-07 VITALS — BP 112/80 | HR 80 | Temp 97.8°F | Resp 18 | Ht 62.0 in | Wt 139.0 lb

## 2012-02-07 DIAGNOSIS — Z0000 Encounter for general adult medical examination without abnormal findings: Secondary | ICD-10-CM

## 2012-02-07 DIAGNOSIS — I10 Essential (primary) hypertension: Secondary | ICD-10-CM

## 2012-02-07 DIAGNOSIS — K5730 Diverticulosis of large intestine without perforation or abscess without bleeding: Secondary | ICD-10-CM

## 2012-02-07 DIAGNOSIS — Z23 Encounter for immunization: Secondary | ICD-10-CM

## 2012-02-07 DIAGNOSIS — I679 Cerebrovascular disease, unspecified: Secondary | ICD-10-CM

## 2012-02-07 DIAGNOSIS — R109 Unspecified abdominal pain: Secondary | ICD-10-CM

## 2012-02-07 MED ORDER — LOSARTAN POTASSIUM 100 MG PO TABS: 100.0000 mg | ORAL_TABLET | Freq: Every day | ORAL | Status: DC

## 2012-02-07 MED ORDER — ATORVASTATIN CALCIUM 10 MG PO TABS: 10.0000 mg | ORAL_TABLET | Freq: Every day | ORAL | Status: DC

## 2012-02-07 MED ORDER — ZOLPIDEM TARTRATE 5 MG PO TABS: 5.0000 mg | ORAL_TABLET | Freq: Every evening | ORAL | Status: DC | PRN

## 2012-02-07 NOTE — Patient Instructions (Signed)
Limit your sodium (Salt) intake    It is important that you exercise regularly, at least 20 minutes 3 to 4 times per week.  If you develop chest pain or shortness of breath seek  medical attention.  Schedule your mammogram.  Insomnia Insomnia is frequent trouble falling and/or staying asleep. Insomnia can be a long term problem or a short term problem. Both are common. Insomnia can be a short term problem when the wakefulness is related to a certain stress or worry. Long term insomnia is often related to ongoing stress during waking hours and/or poor sleeping habits. Overtime, sleep deprivation itself can make the problem worse. Every little thing feels more severe because you are overtired and your ability to cope is decreased. CAUSES    Stress, anxiety, and depression.   Poor sleeping habits.   Distractions such as TV in the bedroom.   Naps close to bedtime.   Engaging in emotionally charged conversations before bed.   Technical reading before sleep.   Alcohol and other sedatives. They may make the problem worse. They can hurt normal sleep patterns and normal dream activity.   Stimulants such as caffeine for several hours prior to bedtime.   Pain syndromes and shortness of breath can cause insomnia.   Exercise late at night.   Changing time zones may cause sleeping problems (jet lag).  It is sometimes helpful to have someone observe your sleeping patterns. They should look for periods of not breathing during the night (sleep apnea). They should also look to see how long those periods last. If you live alone or observers are uncertain, you can also be observed at a sleep clinic where your sleep patterns will be professionally monitored. Sleep apnea requires a checkup and treatment. Give your caregivers your medical history. Give your caregivers observations your family has made about your sleep.   SYMPTOMS    Not feeling rested in the morning.   Anxiety and restlessness at  bedtime.   Difficulty falling and staying asleep.  TREATMENT    Your caregiver may prescribe treatment for an underlying medical disorders. Your caregiver can give advice or help if you are using alcohol or other drugs for self-medication. Treatment of underlying problems will usually eliminate insomnia problems.   Medications can be prescribed for short time use. They are generally not recommended for lengthy use.   Over-the-counter sleep medicines are not recommended for lengthy use. They can be habit forming.   You can promote easier sleeping by making lifestyle changes such as:   Using relaxation techniques that help with breathing and reduce muscle tension.   Exercising earlier in the day.   Changing your diet and the time of your last meal. No night time snacks.   Establish a regular time to go to bed.   Counseling can help with stressful problems and worry.   Soothing music and white noise may be helpful if there are background noises you cannot remove.   Stop tedious detailed work at least one hour before bedtime.  HOME CARE INSTRUCTIONS    Keep a diary. Inform your caregiver about your progress. This includes any medication side effects. See your caregiver regularly. Take note of:   Times when you are asleep.   Times when you are awake during the night.   The quality of your sleep.   How you feel the next day.  This information will help your caregiver care for you.  Get out of bed if you are still awake  after 15 minutes. Read or do some quiet activity. Keep the lights down. Wait until you feel sleepy and go back to bed.   Keep regular sleeping and waking hours. Avoid naps.   Exercise regularly.   Avoid distractions at bedtime. Distractions include watching television or engaging in any intense or detailed activity like attempting to balance the household checkbook.   Develop a bedtime ritual. Keep a familiar routine of bathing, brushing your teeth, climbing  into bed at the same time each night, listening to soothing music. Routines increase the success of falling to sleep faster.   Use relaxation techniques. This can be using breathing and muscle tension release routines. It can also include visualizing peaceful scenes. You can also help control troubling or intruding thoughts by keeping your mind occupied with boring or repetitive thoughts like the old concept of counting sheep. You can make it more creative like imagining planting one beautiful flower after another in your backyard garden.   During your day, work to eliminate stress. When this is not possible use some of the previous suggestions to help reduce the anxiety that accompanies stressful situations.  MAKE SURE YOU:    Understand these instructions.   Will watch your condition.   Will get help right away if you are not doing well or get worse.  Document Released: 04/13/2000 Document Revised: 07/09/2011 Document Reviewed: 05/14/2007 [facility] Patient Information 2013 [place], [place].   Diverticulitis A diverticulum is a small pouch or sac on the colon. Diverticulosis is the presence of these diverticula on the colon. Diverticulitis is the irritation (inflammation) or infection of diverticula. CAUSES   The colon and its diverticula contain bacteria. If food particles block the tiny opening to a diverticulum, the bacteria inside can grow and cause an increase in pressure. This leads to infection and inflammation and is called diverticulitis. SYMPTOMS    Abdominal pain and tenderness. Usually, the pain is located on the left side of your abdomen. However, it could be located elsewhere.   Fever.   Bloating.   Feeling sick to your stomach (nausea).   Throwing up (vomiting).   Abnormal stools.  DIAGNOSIS   Your caregiver will take a history and perform a physical exam. Since many things can cause abdominal pain, other tests may be necessary. Tests may include:  Blood tests.    Urine tests.   X-ray of the abdomen.   CT scan of the abdomen.  Sometimes, surgery is needed to determine if diverticulitis or other conditions are causing your symptoms. TREATMENT   Most of the time, you can be treated without surgery. Treatment includes:  Resting the bowels by only having liquids for a few days. As you improve, you will need to eat a low-fiber diet.   Intravenous (IV) fluids if you are losing body fluids (dehydrated).   Antibiotic medicines that treat infections may be given.   Pain and nausea medicine, if needed.   Surgery if the inflamed diverticulum has burst.  HOME CARE INSTRUCTIONS    Try a clear liquid diet (broth, tea, or water for as long as directed by your caregiver). You may then gradually begin a low-fiber diet as tolerated. A low-fiber diet is a diet with less than 10 grams of fiber. Choose the foods below to reduce fiber in the diet:   White breads, cereals, rice, and pasta.   Cooked fruits and vegetables or soft fresh fruits and vegetables without the skin.   Ground or well-cooked tender beef, ham, veal, lamb,  pork, or poultry.   Eggs and seafood.   After your diverticulitis symptoms have improved, your caregiver may put you on a high-fiber diet. A high-fiber diet includes 14 grams of fiber for every 1000 calories consumed. For a standard 2000 calorie diet, you would need 28 grams of fiber. Follow these diet guidelines to help you increase the fiber in your diet. It is important to slowly increase the amount fiber in your diet to avoid gas, constipation, and bloating.   Choose whole-grain breads, cereals, pasta, and brown rice.   Choose fresh fruits and vegetables with the skin on. Do not overcook vegetables because the more vegetables are cooked, the more fiber is lost.   Choose more nuts, seeds, legumes, dried peas, beans, and lentils.   Look for food products that have greater than 3 grams of fiber per serving on the Nutrition Facts  label.   Take all medicine as directed by your caregiver.   If your caregiver has given you a follow-up appointment, it is very important that you go. Not going could result in lasting (chronic) or permanent injury, pain, and disability. If there is any problem keeping the appointment, call to reschedule.  SEEK MEDICAL CARE IF:    Your pain does not improve.   You have a hard time advancing your diet beyond clear liquids.   Your bowel movements do not return to normal.  SEEK IMMEDIATE MEDICAL CARE IF:    Your pain becomes worse.   You have an oral temperature above 102 F (38.9 C), not controlled by medicine.   You have repeated vomiting.   You have bloody or black, tarry stools.   Symptoms that brought you to your caregiver become worse or are not getting better.  MAKE SURE YOU:    Understand these instructions.   Will watch your condition.   Will get help right away if you are not doing well or get worse.  Document Released: 01/24/2005 Document Revised: 07/09/2011 Document Reviewed: 05/22/2010 [facility] Patient Information 2013 [place], [place].

## 2012-02-07 NOTE — Progress Notes (Signed)
Patient ID: Erica Young, female   DOB: December 10, 1940, 71 y.o.   MRN: [id number]  Subjective:    Patient ID: Erica Young, female    DOB: 09/27/1940, 71 y.o.   MRN: [id number]  HPI  71 year old patient who is seen today for a preventive health examination. She has been self referred to GI due  To postprandial abdominal pain most marked on the left but also on the right.  She does have a history of diverticular disease; she remains on a high fiber diet including Metamucil supplements.   She has a history of hypothyroidism possibly. She was on thyroid supplementation at 1 point do to a hair loss. A recent TSH  was slightly elevated. She has a history of right breast cancer and is scheduled for followup mammogram soon. She has an appointment with ophthalmology  Her chief complaint today is anxiety and insomnia.  She has a history of through vascular disease and denies any focal neurological symptoms. She has treated hypertension. She has been on statin therapy due  to her cerebrovascular disease  1. Risk factors, based on past  M,S,F history-  cardiovascular risk factors include a history of hypertension and dyslipidemia. She does have a history of cerebrovascular disease  2.  Physical activities: No limitations remains active  3.  Depression/mood: No history of depression or mood disorder  4.  Hearing: No deficits  5.  ADL's: Independent in all aspects of daily living 6.  Fall risk: Low  7.  Home safety: No problems identified  8.  Height weight, and visual acuity; height and weight stable. No change in visual acuity.  9.  Counseling: None appropriate at this time  10. Lab orders based on risk factors: Laboratory profile including TSH and lipid profile will be reviewed 11. Referral : She is status post right breast lumpectomy for breast cancer 5 years ago needs followup mammogram. Needs a gynecologic referral  12. Care plan: Mammogram and GYN referral low-salt diet encouraged as well as regular  exercise 13. 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   father died in his early 56s from gastric cancer  mother died of complications of cerebral vascular disease in her early 45s  two brothers 4 sisters-positive for coronary artery disease and cerebral vascular disease   Past Medical History  Diagnosis Date  . HYPOTHYROIDISM 03/03/2010  . HYPERLIPIDEMIA 03/03/2010  . CEREBROVASCULAR DISEASE 03/03/2010  . DIVERTICULOSIS, COLON 03/03/2010  . Low back pain   . Right leg pain   . History of cerebral artery stenosis     right middle  . Cancer     right breat ca  , lumpectomy and radiation tx (declined chemo and additional prophylactic meds)  . Mastoiditis     noted on brain MRI  . Foramen ovale     positive bubble study  . Diverticulitis   . Fatty liver 08/02/09    as per U/S done by [facility]  .  Internal hemorrhoid    Past Surgical History  Procedure Date  . Cholecystectomy   . Breast lumpectomy   . Tonsillectomy     reports that she has quit smoking. She does not have any smokeless tobacco history on file. Her alcohol and drug histories not on file. family history includes Heart disease in her brothers and sisters. Allergies  Allergen Reactions  . Ciprofloxacin Anaphylaxis     Review of Systems  Constitutional: Negative for fever, appetite change, fatigue and  unexpected weight change.  HENT: Negative for hearing loss, ear pain, nosebleeds, congestion, sore throat, mouth sores, trouble swallowing, neck stiffness, dental problem, voice change, sinus pressure and tinnitus.   Eyes: Negative for photophobia, pain, redness and visual disturbance.  Respiratory: Negative for cough, chest tightness and shortness of breath.   Cardiovascular: Negative for chest pain, palpitations and leg swelling.  Gastrointestinal: Negative for nausea, vomiting, abdominal pain, diarrhea, constipation, blood in stool, abdominal distention and rectal pain.  Genitourinary: Negative for dysuria, urgency, frequency, hematuria, flank pain, vaginal bleeding, vaginal discharge, difficulty urinating, genital sores, vaginal pain, menstrual problem and pelvic pain.  Musculoskeletal: Negative for back pain and arthralgias.  Skin: Negative for rash.  Neurological: Negative for dizziness, syncope, speech difficulty, weakness, light-headedness, numbness and headaches.  Hematological: Negative for adenopathy. Does not bruise/bleed easily.  Psychiatric/Behavioral: Negative for suicidal ideas, behavioral problems, self-injury, dysphoric mood and agitation. The patient is not nervous/anxious.        Objective:   Physical Exam  Constitutional: She is oriented to person, place, and time. She appears well-developed and well-nourished.  HENT:  Head: Normocephalic and atraumatic.  Right Ear: External ear normal.  Left Ear: External ear normal.  Mouth/Throat: Oropharynx is clear and moist.  Eyes: Conjunctivae normal and EOM are normal.  Neck: Normal range of motion. Neck supple. No JVD present. No thyromegaly present.  Cardiovascular: Normal rate, regular rhythm, normal heart sounds and intact distal pulses.   No murmur heard. Pulmonary/Chest: Effort normal and breath sounds normal. She has no wheezes. She has no rales.       Incision right lateral breast well-healed. Breast exam unremarkable.  Axilla clear  Abdominal: Soft. Bowel sounds are normal. She exhibits no distension and no mass. There is no tenderness. There is no rebound and no guarding.  Genitourinary: Vagina normal.  Musculoskeletal: Normal range of motion. She exhibits no edema and no tenderness.  Neurological: She is alert and oriented to person, place, and time. She has normal reflexes. No cranial nerve deficit. She exhibits normal muscle tone. Coordination normal.  Skin: Skin is warm and dry. No rash noted.  Psychiatric: She has a normal mood and affect. Her behavior is normal.          Assessment & Plan:   Preventive health. We'll set up for mammogram and gynecologic referral Hypertension well controlled Dyslipidemia.  will continue statin therapy  Postprandial abdominal pain. The patient has a GI consultation scheduled for tomorrow  History breast cancer. Followup mammogram   Recheck 6 months  Patient will consider a behavioral health referral. Information discussed  EKG  Reviewed- WNL

## 2012-02-08 ENCOUNTER — Encounter: Payer: Self-pay | Admitting: Internal Medicine

## 2012-02-08 ENCOUNTER — Ambulatory Visit (INDEPENDENT_AMBULATORY_CARE_PROVIDER_SITE_OTHER): Payer: 59 | Admitting: Internal Medicine

## 2012-02-08 VITALS — BP 122/76 | HR 88 | Ht 62.0 in | Wt 136.0 lb

## 2012-02-08 DIAGNOSIS — R1032 Left lower quadrant pain: Secondary | ICD-10-CM

## 2012-02-08 DIAGNOSIS — K5732 Diverticulitis of large intestine without perforation or abscess without bleeding: Secondary | ICD-10-CM

## 2012-02-08 MED ORDER — MOVIPREP 100 G PO SOLR: 1.0000 | Freq: Once | ORAL | Status: DC

## 2012-02-08 MED ORDER — DICYCLOMINE HCL 10 MG PO CAPS: 10.0000 mg | ORAL_CAPSULE | Freq: Two times a day (BID) | ORAL | Status: DC

## 2012-02-08 NOTE — Patient Instructions (Addendum)
.  You have been scheduled for a colonoscopy with propofol. Please follow written instructions given to you at your visit today.  Please pick up your prep kit at the pharmacy within the next 1-3 days. If you use inhalers (even only as needed), please bring them with you on the day of your procedure. We have sent the following medications to your pharmacy for you to pick up at your convenience: Bentyl 10 mg twice daily CC: Dr Amador Cunas

## 2012-02-08 NOTE — Progress Notes (Signed)
Erica Young Jan 15, 1941 MRN [id number]   History of Present Illness:  This is a 71 year old white female with symptomatic diverticulosis. Her last colonoscopy 3 years ago in [place] showed severe diverticulosis of the left colon. She has been having attacks of lower abdominal pain lasting several days and responding to antibiotics and bowel rest. She is taking Metamucil 1.5 teaspoons twice a day. She also has irritable bowel syndrome which is exacerbated by stress. She moved to [place] to be close to her son who is divorced and she has a lot of stress taking care of his children. She admits to her stomach problems being related to stress. She has occasional rectal bleeding. 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 There is no family history of colon cancer.   Past Medical History  Diagnosis Date  . HYPOTHYROIDISM 03/03/2010  . HYPERLIPIDEMIA 03/03/2010  . CEREBROVASCULAR DISEASE 03/03/2010  . DIVERTICULOSIS, COLON 03/03/2010  . Low back pain   . Right leg pain   . History of cerebral artery stenosis     right middle  . Cancer     right breat ca  , lumpectomy and radiation tx (declined chemo and additional prophylactic meds)  . Mastoiditis     noted on brain MRI  . Foramen ovale     positive bubble study  . Diverticulitis   . Fatty liver 08/02/09    as per U/S done by [facility]  . Internal hemorrhoid    Past Surgical History  Procedure Date  . Cholecystectomy   . Breast lumpectomy   . Tonsillectomy     reports that she has quit smoking. She has never used smokeless tobacco. She reports that she does not drink alcohol or use illicit drugs. family history includes Heart disease in her brothers and sisters.  There is no history of Colon cancer. Allergies  Allergen Reactions  . Ciprofloxacin Anaphylaxis        Review of Systems: Denies dysphagia odynophagia.  The remainder of the 10 point ROS is negative except as outlined in  H&P   Physical Exam: General appearance  Well developed, in no distress. Eyes- non icteric. HEENT nontraumatic, normocephalic. Mouth no lesions, tongue papillated, no cheilosis. Neck supple without adenopathy, thyroid not enlarged, no carotid bruits, no JVD. Lungs Clear to auscultation bilaterally. Cor normal S1, normal S2, regular rhythm, no murmur,  quiet precordium. Abdomen: Soft tender in the right lower quadrant and in epigastrium. Normal active bowel sounds. Liver edge at costal margin. Postcholecystectomy scars.  Rectal: Soft Hemoccult negative stool. Extremities no pedal edema. Skin no lesions. Neurological alert and oriented x 3. Psychological normal mood and affect.  Assessment and Plan  Problem #1 Intermittent episodic abdominal pain and presence of severe diverticulosis documented on a recent colonoscopy 3 years ago in [place]. She has had at least 2 attacks this year requiring antibiotics. It is not clear to what extent her symptoms are due to irritable bowel syndrome or due to severe diverticulosis. We will proceed with colonoscopy to assess the severity of her disease. I have discussed the possibility of segmental sigmoid resection. We will add Bentyl 10 mg twice a day: She will continue Metamucil. Depending on the findings on colonoscopy, we will make a decision as to whether she is a candidate for surgery.   02/08/2012 [name]

## 2012-02-11 ENCOUNTER — Encounter: Payer: Self-pay | Admitting: Internal Medicine

## 2012-02-13 ENCOUNTER — Encounter: Payer: Self-pay | Admitting: Internal Medicine

## 2012-02-13 ENCOUNTER — Ambulatory Visit (AMBULATORY_SURGERY_CENTER): Payer: 59 | Admitting: Internal Medicine

## 2012-02-13 VITALS — BP 163/73 | HR 69 | Temp 97.8°F | Resp 30 | Ht 62.0 in | Wt 136.0 lb

## 2012-02-13 DIAGNOSIS — D126 Benign neoplasm of colon, unspecified: Secondary | ICD-10-CM

## 2012-02-13 DIAGNOSIS — K5732 Diverticulitis of large intestine without perforation or abscess without bleeding: Secondary | ICD-10-CM

## 2012-02-13 DIAGNOSIS — R1032 Left lower quadrant pain: Secondary | ICD-10-CM

## 2012-02-13 MED ORDER — SODIUM CHLORIDE 0.9 % IV SOLN: 500.0000 mL | INTRAVENOUS | Status: DC

## 2012-02-13 MED ORDER — ALPRAZOLAM 0.25 MG PO TABS
0.2500 mg | ORAL_TABLET | Freq: Two times a day (BID) | ORAL | Status: DC | PRN
Start: 2012-02-13 — End: 2012-04-09

## 2012-02-13 NOTE — Op Note (Signed)
[facility] [address] [place], [address]   COLONOSCOPY PROCEDURE REPORT  PATIENT: Erica, Young  MR#: [id number] BIRTHDATE: 1940-12-21 , 71  yrs. old GENDER: Female ENDOSCOPIST: Hart Carwin, MD REFERRED BY:  Eleonore Chiquito, M.D. PROCEDURE DATE:  02/13/2012 PROCEDURE:   Colonoscopy with cold biopsy polypectomy ASA CLASS:   Class II INDICATIONS:abdominal pain in the lower left quadrant. history of severe diverticulosis of the sigmoid colon on colonoscopy in [place] in 2010. Patient having pain   refractory to antibiotics MEDICATIONS: MAC sedation, administered by CRNA and Propofol (Diprivan) 200 mg IV  DESCRIPTION OF PROCEDURE:   After the risks and benefits and of the procedure were explained, informed consent was obtained.  A digital rectal exam revealed no abnormalities of the rectum.    The LB CF-[id number]  endoscope was introduced through the anus and advanced to the cecum, which was identified by both the appendix and ileocecal valve .  The quality of the prep was good, using MoviPrep .  The instrument was then slowly withdrawn as the colon was fully examined.     COLON FINDINGS: There was moderate diverticulosis noted throughout the entire examined colon with associated muscular hypertrophy and tortuosity.  there was no evidence of obstruction. colonoscopy past through sigmoid colon without difficulty. There was no evidence of acute inflammation. small 3  millimeters sessile polyp was found in the cecum and was removed with cold biopsy forceps. It was sent to pathology    Retroflexed views revealed no abnormalities.     The scope was then withdrawn from the patient and the procedure completed.  COMPLICATIONS: There were no complications. ENDOSCOPIC IMPRESSION: There was moderate diverticulosis noted throughout the entire examined colon , narrowing and tortuosity of the sigmoid colon but no evidence of diverticulitis. patient's  symptoms seem to be out of proportion to objective findings. suspect irritable bowel syndrome contributing to patient's abdominal pain  Cecal polyp 3 mm sessile polyp removed with cold biopsies  RECOMMENDATIONS: await biopsy results 15-20 g fiber diet Antispasmodics Bentyl -10 mg 3 times a day ( stress??) Followup in the office 6-8 weeks Would not recommend surgical resection based on today's findings  REPEAT EXAM: In 5-10 year(s)  for Colonoscopy.  depending on polyp pathology  cc:  _______________________________ eSigned:  Hart Carwin, MD 02/13/2012 5:22 PM     PATIENT NAME:  Erica, Young MR#: [id number]

## 2012-02-13 NOTE — Progress Notes (Signed)
Patient did not experience any of the following events: a burn prior to discharge; a fall within the facility; wrong site/side/patient/procedure/implant event; or a hospital transfer or hospital admission upon discharge from the facility. (G8907) Patient did not have preoperative order for IV antibiotic SSI prophylaxis. (G8918)  

## 2012-02-13 NOTE — Patient Instructions (Signed)
Colon polyp x 1 removed. Follow up with Dr.Brodie in office in 6-8 weeks. Call office at [phone] to make that appointment. Fiber diet of 15-20 grams of fiber per day, see handout given. Repeat colonoscopy depends on pathology results. Letter in your mail in 2-3 weeks. Call us with any questions or concerns. Thank you!!   YOU HAD AN ENDOSCOPIC PROCEDURE TODAY AT THE [facility]: Refer to the procedure report that was given to you for any specific questions about what was found during the examination.  If the procedure report does not answer your questions, please call your gastroenterologist to clarify.  If you requested that your care partner not be given the details of your procedure findings, then the procedure report has been included in a sealed envelope for you to review at your convenience later.  YOU SHOULD EXPECT: Some feelings of bloating in the abdomen. Passage of more gas than usual.  Walking can help get rid of the air that was put into your GI tract during the procedure and reduce the bloating. If you had a lower endoscopy (such as a colonoscopy or flexible sigmoidoscopy) you may notice spotting of blood in your stool or on the toilet paper. If you underwent a bowel prep for your procedure, then you may not have a normal bowel movement for a few days.  DIET: Your first meal following the procedure should be a light meal and then it is ok to progress to your normal diet.  A half-sandwich or bowl of soup is an example of a good first meal.  Heavy or fried foods are harder to digest and may make you feel nauseous or bloated.  Likewise meals heavy in dairy and vegetables can cause extra gas to form and this can also increase the bloating.  Drink plenty of fluids but you should avoid alcoholic beverages for 24 hours.  ACTIVITY: Your care partner should take you home directly after the procedure.  You should plan to take it easy, moving slowly for the rest of the day.  You can resume  normal activity the day after the procedure however you should NOT DRIVE or use heavy machinery for 24 hours (because of the sedation medicines used during the test).    SYMPTOMS TO REPORT IMMEDIATELY: A gastroenterologist can be reached at any hour.  During normal business hours, 8:30 AM to 5:00 PM Monday through Friday, call [phone].  After hours and on weekends, please call the GI answering service at [phone] who will take a message and have the physician on call contact you.   Following lower endoscopy (colonoscopy or flexible sigmoidoscopy):  Excessive amounts of blood in the stool  Significant tenderness or worsening of abdominal pains  Swelling of the abdomen that is new, acute  Fever of 100F or higher  FOLLOW UP: If any biopsies were taken you will be contacted by phone or by letter within the next 1-3 weeks.  Call your gastroenterologist if you have not heard about the biopsies in 3 weeks.  Our staff will call the home number listed on your records the next business day following your procedure to check on you and address any questions or concerns that you may have at that time regarding the information given to you following your procedure. This is a courtesy call and so if there is no answer at the home number and we have not heard from you through the emergency physician on call, we will assume that you have  returned to your regular daily activities without incident.  SIGNATURES/CONFIDENTIALITY: You and/or your care partner have signed paperwork which will be entered into your electronic medical record.  These signatures attest to the fact that that the information above on your After Visit Summary has been reviewed and is understood.  Full responsibility of the confidentiality of this discharge information lies with you and/or your care-partner.

## 2012-02-14 ENCOUNTER — Telehealth: Payer: Self-pay | Admitting: *Deleted

## 2012-02-14 ENCOUNTER — Encounter: Payer: Self-pay | Admitting: *Deleted

## 2012-02-14 NOTE — Telephone Encounter (Signed)
  Follow up Call-  Call back number 02/13/2012  Post procedure Call Back phone  # [phone]  Permission to leave phone message Yes     Patient questions:  Do you have a fever, pain , or abdominal swelling? no Pain Score  0 *  Have you tolerated food without any problems? yes  Have you been able to return to your normal activities? yes  Do you have any questions about your discharge instructions: Diet   no Medications  no Follow up visit  no  Do you have questions or concerns about your Care? no  Actions: * If pain score is 4 or above: No action needed, pain <4.

## 2012-02-19 ENCOUNTER — Encounter: Payer: Self-pay | Admitting: Internal Medicine

## 2012-02-26 ENCOUNTER — Ambulatory Visit
Admission: RE | Admit: 2012-02-26 | Discharge: 2012-02-26 | Disposition: A | Discharge status: Home / Self Care | Payer: 59 | Source: Ambulatory Visit | Attending: Obstetrics and Gynecology | Admitting: Obstetrics and Gynecology

## 2012-02-26 DIAGNOSIS — Z9889 Other specified postprocedural states: Secondary | ICD-10-CM

## 2012-02-26 DIAGNOSIS — C50919 Malignant neoplasm of unspecified site of unspecified female breast: Secondary | ICD-10-CM

## 2012-04-04 ENCOUNTER — Ambulatory Visit: Payer: 59 | Admitting: Internal Medicine

## 2012-04-09 ENCOUNTER — Encounter: Payer: Self-pay | Admitting: Internal Medicine

## 2012-04-09 ENCOUNTER — Ambulatory Visit (INDEPENDENT_AMBULATORY_CARE_PROVIDER_SITE_OTHER): Payer: 59 | Admitting: Internal Medicine

## 2012-04-09 VITALS — BP 112/80 | HR 80 | Ht 61.5 in | Wt 141.2 lb

## 2012-04-09 DIAGNOSIS — K5730 Diverticulosis of large intestine without perforation or abscess without bleeding: Secondary | ICD-10-CM

## 2012-04-09 DIAGNOSIS — K589 Irritable bowel syndrome without diarrhea: Secondary | ICD-10-CM

## 2012-04-09 MED ORDER — ALPRAZOLAM 0.25 MG PO TABS: 0.2500 mg | ORAL_TABLET | Freq: Two times a day (BID) | ORAL | Status: DC | PRN

## 2012-04-09 NOTE — Progress Notes (Signed)
Erica Young 27-Dec-1940 MRN [id number]  History of Present Illness:  This is a 71 year old white female who is post colonoscopy for chronic abdominal pain with findings of moderately severe diverticulosis of the left and right colon. She had a prior colonoscopy in [place] in January 2010 which reported severe diverticulosis. Patient has responded to probiotics every day, Bentyl 10 mg twice a day and Xanax 0.25 mg twice a day. She no longer has crampy abdominal pain, gas or attacks of bloating. She is very satisfied with the results. Her diverticulosis on colonoscopy was not as severe as reported on her prior colonoscopy and there was no obstruction or signs of inflammation. On the basis of the colonoscopy report, we did not recommend surgery.   Past Medical History  Diagnosis Date  . HYPOTHYROIDISM 03/03/2010  . HYPERLIPIDEMIA 03/03/2010  . CEREBROVASCULAR DISEASE 03/03/2010  . DIVERTICULOSIS, COLON 03/03/2010  . Low back pain   . Right leg pain   . History of cerebral artery stenosis     right middle  . Cancer     right breat ca  , lumpectomy and radiation tx (declined chemo and additional prophylactic meds)  . Mastoiditis     noted on brain MRI  . Foramen ovale     positive bubble study  . Diverticulitis   . Fatty liver 08/02/09    as per U/S done by [facility]  . Internal hemorrhoid   . Stroke   . Anxiety   . Hypertension   . Ulcer    Past Surgical History  Procedure Date  . Cholecystectomy   . Breast lumpectomy     right  . Tonsillectomy     reports that she has quit smoking. She has never used smokeless tobacco. She reports that she does not drink alcohol or use illicit drugs. family history includes Heart disease in her brothers and sisters.  There is no history of Colon cancer, and Esophageal cancer, and Rectal cancer, and Stomach cancer, . Allergies  Allergen Reactions  . Ciprofloxacin Anaphylaxis        Review of Systems: Negative for abdominal  pain dysphagia chest pain  The remainder of the 10 point ROS is negative except as outlined in H&P   Physical Exam: General appearance  Well developed, in no distress. Eyes- non icteric. HEENT nontraumatic, normocephalic. Mouth no lesions, tongue papillated, no cheilosis. Neck supple without adenopathy, thyroid not enlarged, no carotid bruits, no JVD. Lungs Clear to auscultation bilaterally. Cor normal S1, normal S2, regular rhythm, no murmur,  quiet precordium. Abdomen: Soft relaxed abdomen with normoactive bowel sounds. No distention. Liver edge at costal margin. Rectal: Not done. Extremities no pedal edema. Skin no lesions. Neurological alert and oriented x 3. Psychological normal mood and affect.  Assessment and Plan:  Problem #1 Moderately severe diverticulosis and irritable bowel syndrome currently under good control with Bentyl 10 mg twice a day which may be reduced to when necessary. She is to continue probiotics one a day and Xanax 0.25 mg twice a day for stress. We will see her in one year.   04/09/2012 Erica Young

## 2012-04-09 NOTE — Patient Instructions (Addendum)
We have sent the following medications to your pharmacy for you to pick up at your convenience: Xanax (We have sent a 6 month rx. We will send an additional 6 month rx when this prescription has ran out.)  Please follow up with Dr Juanda Chance in 1 year.  CC: Dr Amador Cunas

## 2012-05-05 ENCOUNTER — Encounter: Payer: Self-pay | Admitting: Obstetrics and Gynecology

## 2012-05-05 ENCOUNTER — Ambulatory Visit (INDEPENDENT_AMBULATORY_CARE_PROVIDER_SITE_OTHER): Payer: Medicare PPO | Admitting: Obstetrics and Gynecology

## 2012-05-05 VITALS — BP 114/72 | Ht 62.0 in | Wt 141.0 lb

## 2012-05-05 DIAGNOSIS — Z0000 Encounter for general adult medical examination without abnormal findings: Secondary | ICD-10-CM

## 2012-05-05 DIAGNOSIS — Z01419 Encounter for gynecological examination (general) (routine) without abnormal findings: Secondary | ICD-10-CM

## 2012-05-05 NOTE — Addendum Note (Signed)
Addended by: Rolla Plate on: 05/05/2012 11:35 AM   Modules accepted: Orders

## 2012-05-05 NOTE — Progress Notes (Signed)
Last Pap: 05/02/2011  WNL: Yes Regular Periods:no  Contraception: Postmenopausal Monthly Breast exam:no Tetanus<70yrs:yes Nl.Bladder Function:yes Daily BMs:no Constipated due to diverticulitis  Healthy Diet:yes Calcium:yes Mammogram:Normal Date of Mammogram: 02/26/2012 Exercise:no Have often Exercise: not really Seatbelt: yes Abuse at home: no Stressful work:no Sigmoid-colonoscopy: Per pt 01/2012 Normal Bone Density: No PCP: Dr Eleonore Chiquito Change in PMH: n/a Change in FMH:n/a BP 114/72  Ht 5\' 2"  (1.575 m)  Wt 141 lb (63.957 kg)  BMI 25.79 kg/m2 Physical Examination: Neck - supple, no significant adenopathy Chest - clear to auscultation, no wheezes, rales or rhonchi, symmetric air entry Heart - normal rate, regular rhythm, normal S1, S2, no murmurs, rubs, clicks or gallops Abdomen - soft, nontender, nondistended, no masses or organomegaly Breasts - breasts appear normal, no suspicious masses, no skin or nipple changes or axillary nodes Pelvic - normal external genitalia, vulva, vagina, cervix, uterus and adnexa, atrophic Rectal - pt declined exam Musculoskeletal - no joint tenderness, deformity or swelling Extremities - peripheral pulses normal, no pedal edema, no clubbing or cyanosis Skin - normal coloration and turgor, no rashes, no suspicious skin lesions noted Normal AEX Pt due for mammogram no colonoscopy due no Pap done yes per pts request Lichen sclerosis. Pt stable no sxs curently no meds Pt with occ pelvic discomfort. Exam was normal.  She declined [place] RT one year Diet and exercise discussed

## 2012-05-06 LAB — PAP IG W/ RFLX HPV ASCU

## 2012-06-10 ENCOUNTER — Encounter: Payer: Self-pay | Admitting: Internal Medicine

## 2012-06-10 ENCOUNTER — Ambulatory Visit (INDEPENDENT_AMBULATORY_CARE_PROVIDER_SITE_OTHER): Payer: 59 | Admitting: Internal Medicine

## 2012-06-10 VITALS — BP 120/80 | HR 74 | Temp 98.5°F | Resp 18 | Wt 142.0 lb

## 2012-06-10 DIAGNOSIS — K5730 Diverticulosis of large intestine without perforation or abscess without bleeding: Secondary | ICD-10-CM

## 2012-06-10 DIAGNOSIS — I10 Essential (primary) hypertension: Secondary | ICD-10-CM

## 2012-06-10 DIAGNOSIS — R109 Unspecified abdominal pain: Secondary | ICD-10-CM

## 2012-06-10 DIAGNOSIS — E785 Hyperlipidemia, unspecified: Secondary | ICD-10-CM

## 2012-06-10 MED ORDER — DICYCLOMINE HCL 10 MG PO CAPS: 10.0000 mg | ORAL_CAPSULE | Freq: Two times a day (BID) | ORAL | Status: DC

## 2012-06-10 MED ORDER — ALPRAZOLAM 0.25 MG PO TABS: 0.2500 mg | ORAL_TABLET | Freq: Two times a day (BID) | ORAL | Status: DC | PRN

## 2012-06-10 NOTE — Progress Notes (Signed)
Subjective:    Patient ID: Erica Young, female    DOB: May 22, 1940, 72 y.o.   MRN: [id number]  HPI  72 year old patient who has a history of cerebrovascular disease as well as dyslipidemia. She has concerns about statin therapy although she has been on medication for a number of years. She had an acute stroke in 2011 and was discharged on atorvastatin 40 mg at that time. Presently she is taking 10 mg daily. She complains of epigastric and generalized abdominal pain she also describes some arm and knee pain. Symptoms are intermittent often occurring 2 or 3 days with periods of being symptom-free. She has seen Dr. Juanda Chance in the past and does have diverticular disease as well as a component of IBS.  Past Medical History  Diagnosis Date  . HYPOTHYROIDISM 03/03/2010  . HYPERLIPIDEMIA 03/03/2010  . CEREBROVASCULAR DISEASE 03/03/2010  . DIVERTICULOSIS, COLON 03/03/2010  . Low back pain   . Right leg pain   . History of cerebral artery stenosis     right middle  . Cancer     right breat ca  , lumpectomy and radiation tx (declined chemo and additional prophylactic meds)  . Mastoiditis     noted on brain MRI  . Foramen ovale     positive bubble study  . Diverticulitis   . Fatty liver 08/02/09    as per U/S done by [facility]  . Internal hemorrhoid   . Stroke   . Anxiety   . Hypertension   . Ulcer     History   Social History  . Marital Status: Married    Spouse Name: N/A    Number of Children: 2  . Years of Education: N/A   Occupational History  . Social Worker--Retired    Social History Main Topics  . Smoking status: Former Games developer  . Smokeless tobacco: Never Used  . Alcohol Use: No  . Drug Use: No  . Sexually Active: Yes   Other Topics Concern  . Not on file   Social History Narrative   Daily caffeine     Past Surgical History  Procedure Laterality Date  . Cholecystectomy    . Breast lumpectomy      right  . Tonsillectomy      Family History  Problem  Relation Age of Onset  . Heart disease Sister     cerebral vascular disease also  . Heart disease Brother     cerebral vascular disease also  . Heart disease Brother     cerebral vascular disease  . Heart disease Sister     cebreal vascular disease also  . Heart disease Sister     cebreal vascular diease also  . Heart disease Sister     cerebral vascular diease also  . Colon cancer Neg Hx   . Esophageal cancer Neg Hx   . Rectal cancer Neg Hx   . Stomach cancer Neg Hx     Allergies  Allergen Reactions  . Ciprofloxacin Anaphylaxis    Current Outpatient Prescriptions on File Prior to Visit  Medication Sig Dispense Refill  . aspirin 81 MG tablet Take 81 mg by mouth daily.      . Calcium-Vitamin D (CALTRATE 600 PLUS-VIT D PO) Take 1 capsule by mouth daily.        Marland Kitchen losartan (COZAAR) 100 MG tablet Take 1 tablet (100 mg total) by mouth daily.  90 tablet  3   No current facility-administered medications on file prior to visit.  BP 120/80  Pulse 74  Temp(Src) 98.5 F (36.9 C) (Oral)  Resp 18  Wt 142 lb (64.411 kg)  BMI 25.97 kg/m2  SpO2 97%       Review of Systems  Constitutional: Negative.   HENT: Negative for hearing loss, congestion, sore throat, rhinorrhea, dental problem, sinus pressure and tinnitus.   Eyes: Negative for pain, discharge and visual disturbance.  Respiratory: Negative for cough and shortness of breath.   Cardiovascular: Negative for chest pain, palpitations and leg swelling.  Gastrointestinal: Positive for abdominal pain. Negative for nausea, vomiting, diarrhea, constipation, blood in stool and abdominal distention.  Genitourinary: Negative for dysuria, urgency, frequency, hematuria, flank pain, vaginal bleeding, vaginal discharge, difficulty urinating, vaginal pain and pelvic pain.  Musculoskeletal: Positive for myalgias and arthralgias. Negative for joint swelling and gait problem.  Skin: Negative for rash.  Neurological: Negative for dizziness,  syncope, speech difficulty, weakness, numbness and headaches.  Hematological: Negative for adenopathy.  Psychiatric/Behavioral: Negative for behavioral problems, dysphoric mood and agitation. The patient is not nervous/anxious.        Objective:   Physical Exam  Constitutional: She is oriented to person, place, and time. She appears well-developed and well-nourished.  HENT:  Head: Normocephalic.  Right Ear: External ear normal.  Left Ear: External ear normal.  Mouth/Throat: Oropharynx is clear and moist.  Eyes: Conjunctivae and EOM are normal. Pupils are equal, round, and reactive to light.  Neck: Normal range of motion. Neck supple. No thyromegaly present.  Cardiovascular: Normal rate, regular rhythm, normal heart sounds and intact distal pulses.   Pulmonary/Chest: Effort normal and breath sounds normal.  Abdominal: Soft. Bowel sounds are normal. She exhibits no mass. There is no tenderness.  Musculoskeletal: Normal range of motion.  Lymphadenopathy:    She has no cervical adenopathy.  Neurological: She is alert and oriented to person, place, and time.  Skin: Skin is warm and dry. No rash noted.  Psychiatric: She has a normal mood and affect. Her behavior is normal.          Assessment & Plan:   Dyslipidemia. The patient wishes to give herself a trial off statin therapy. We'll discontinue atorvastatin and placed on a diet. Hypertension well controlled Abdominal pain. Will resume then tilted take when necessary  Recheck when necessary or the time of her physical in the fall

## 2012-06-10 NOTE — Patient Instructions (Addendum)
Limit your sodium (Salt) intake    It is important that you exercise regularly, at least 20 minutes 3 to 4 times per week.  If you develop chest pain or shortness of breath seek  medical attention.  Hypercholesterolemia High Blood Cholesterol Cholesterol is a white, waxy, fat-like protein needed by your body in small amounts. The liver makes all the cholesterol you need. It is carried from the liver by the blood through the blood vessels. Deposits (plaque) may build up on blood vessel walls. This makes the arteries narrower and stiffer. Plaque increases the risk for heart attack and stroke. You cannot feel your cholesterol level even if it is very high. The only way to know is by a blood test to check your lipid (fats) levels. Once you know your cholesterol levels, you should keep a record of the test results. Work with your caregiver to to keep your levels in the desired range. WHAT THE RESULTS MEAN:  Total cholesterol is a rough measure of all the cholesterol in your blood.  LDL is the so-called bad cholesterol. This is the type that deposits cholesterol in the walls of the arteries. You want this level to be low.  HDL is the good cholesterol because it cleans the arteries and carries the LDL away. You want this level to be high.  Triglycerides are fat that the body can either burn for energy or store. High levels are closely linked to heart disease. DESIRED LEVELS:  Total cholesterol below 200.  LDL below 100 for people at risk, below 70 for very high risk.  HDL above 50 is good, above 60 is best.  Triglycerides below 150. HOW TO LOWER YOUR CHOLESTEROL:  Diet.  Choose fish or white meat chicken and [place], roasted or baked. Limit fatty cuts of red meat, fried foods, and processed meats, such as sausage and lunch meat.  Eat lots of fresh fruits and vegetables. Choose whole grains, beans, pasta, potatoes and cereals.  Use only small amounts of olive, corn or canola oils. Avoid  butter, mayonnaise, shortening or palm kernel oils. Avoid foods with trans-fats.  Use skim/nonfat milk and low-fat/nonfat yogurt and cheeses. Avoid whole milk, cream, ice cream, egg yolks and cheeses. Healthy desserts include angel food cake, gingersnaps, animal crackers, hard candy, popsicles, and low-fat/nonfat frozen yogurt. Avoid pastries, cakes, pies and cookies.  Exercise.  A regular program helps decrease LDL and raises HDL.  Helps with weight control.  Do things that increase your activity level like gardening, walking, or taking the stairs.  Medication.  May be prescribed by your caregiver to help lowering cholesterol and the risk for heart disease.  You may need medicine even if your levels are normal if you have several risk factors. HOME CARE INSTRUCTIONS   Follow your diet and exercise programs as suggested by your caregiver.  Take medications as directed.  Have blood work done when your caregiver feels it is necessary. MAKE SURE YOU:   Understand these instructions.  Will watch your condition.  Will get help right away if you are not doing well or get worse. Document Released: 04/16/2005 Document Revised: 07/09/2011 Document Reviewed: 10/02/2006 [facility] Patient Information 2013 [place], [place]. Fat and Cholesterol Control Diet Cholesterol levels in your body are determined significantly by your diet. Cholesterol levels may also be related to heart disease. The following material helps to explain this relationship and discusses what you can do to help keep your heart healthy. Not all cholesterol is bad. Low-density lipoprotein (LDL) cholesterol  is the "bad" cholesterol. It may cause fatty deposits to build up inside your arteries. High-density lipoprotein (HDL) cholesterol is "good." It helps to remove the "bad" LDL cholesterol from your blood. Cholesterol is a very important risk factor for heart disease. Other risk factors are high blood pressure, smoking, stress,  heredity, and weight. The heart muscle gets its supply of blood through the coronary arteries. If your LDL cholesterol is high and your HDL cholesterol is low, you are at risk for having fatty deposits build up in your coronary arteries. This leaves less room through which blood can flow. Without sufficient blood and oxygen, the heart muscle cannot function properly and you may feel chest pains (angina pectoris). When a coronary artery closes up entirely, a part of the heart muscle may die causing a heart attack (myocardial infarction). CHECKING CHOLESTEROL When your caregiver sends your blood to a lab to be examined for cholesterol, a complete lipid (fat) profile may be done. With this test, the total amount of cholesterol and levels of LDL and HDL are determined. Triglycerides are a type of fat that circulates in the blood. They can also be used to determine heart disease risk. The list below describes what the numbers should be: Test: Total Cholesterol.  Less than 200 mg/dl. Test: LDL "bad cholesterol."  Less than 100 mg/dl.  Less than 70 mg/dl if you are at very high risk of a heart attack or sudden cardiac death. Test: HDL "good cholesterol."  Greater than 50 mg/dl for women.  Greater than 40 mg/dl for men. Test: Triglycerides.  Less than 150 mg/dl. CONTROLLING CHOLESTEROL WITH DIET Although exercise and lifestyle factors are important, your diet is key. That is because certain foods are known to raise cholesterol and others to lower it. The goal is to balance foods for their effect on cholesterol and more importantly, to replace saturated and trans fat with other types of fat, such as monounsaturated fat, polyunsaturated fat, and omega-3 fatty acids. On average, a person should consume no more than 15 to 17 g of saturated fat daily. Saturated and trans fats are considered "bad" fats, and they will raise LDL cholesterol. Saturated fats are primarily found in animal products such as meats,  butter, and cream. However, that does not mean you need to give up all your favorite foods. Today, there are good tasting, low-fat, low-cholesterol substitutes for most of the things you like to eat. Choose low-fat or nonfat alternatives. Choose round or loin cuts of red meat. These types of cuts are lowest in fat and cholesterol. Chicken (without the skin), fish, veal, and ground [place] breast are great choices. Eliminate fatty meats, such as hot dogs and salami. Even shellfish have little or no saturated fat. Have a 3 oz (85 g) portion when you eat lean meat, poultry, or fish. Trans fats are also called "partially hydrogenated oils." They are oils that have been scientifically manipulated so that they are solid at room temperature resulting in a longer shelf life and improved taste and texture of foods in which they are added. Trans fats are found in stick margarine, some tub margarines, cookies, crackers, and baked goods.  When baking and cooking, oils are a great substitute for butter. The monounsaturated oils are especially beneficial since it is believed they lower LDL and raise HDL. The oils you should avoid entirely are saturated tropical oils, such as coconut and palm.  Remember to eat a lot from food groups that are naturally free of saturated and  trans fat, including fish, fruit, vegetables, beans, grains (barley, rice, couscous, bulgur wheat), and pasta (without cream sauces).  IDENTIFYING FOODS THAT LOWER CHOLESTEROL  Soluble fiber may lower your cholesterol. This type of fiber is found in fruits such as apples, vegetables such as broccoli, potatoes, and carrots, legumes such as beans, peas, and lentils, and grains such as barley. Foods fortified with plant sterols (phytosterol) may also lower cholesterol. You should eat at least 2 g per day of these foods for a cholesterol lowering effect.  Read package labels to identify low-saturated fats, trans fat free, and low-fat foods at the supermarket.  Select cheeses that have only 2 to 3 g saturated fat per ounce. Use a heart-healthy tub margarine that is free of trans fats or partially hydrogenated oil. When buying baked goods (cookies, crackers), avoid partially hydrogenated oils. Breads and muffins should be made from whole grains (whole-wheat or whole oat flour, instead of "flour" or "enriched flour"). Buy non-creamy canned soups with reduced salt and no added fats.  FOOD PREPARATION TECHNIQUES  Never deep-fry. If you must fry, either stir-fry, which uses very little fat, or use non-stick cooking sprays. When possible, broil, bake, or roast meats, and steam vegetables. Instead of putting butter or margarine on vegetables, use lemon and herbs, applesauce, and cinnamon (for squash and sweet potatoes), nonfat yogurt, salsa, and low-fat dressings for salads.  LOW-SATURATED FAT / LOW-FAT FOOD SUBSTITUTES Meats / Saturated Fat (g)  Avoid: Steak, marbled (3 oz/85 g) / 11 g  Choose: Steak, lean (3 oz/85 g) / 4 g  Avoid: Hamburger (3 oz/85 g) / 7 g  Choose: Hamburger, lean (3 oz/85 g) / 5 g  Avoid: Ham (3 oz/85 g) / 6 g  Choose: Ham, lean cut (3 oz/85 g) / 2.4 g  Avoid: Chicken, with skin, dark meat (3 oz/85 g) / 4 g  Choose: Chicken, skin removed, dark meat (3 oz/85 g) / 2 g  Avoid: Chicken, with skin, light meat (3 oz/85 g) / 2.5 g  Choose: Chicken, skin removed, light meat (3 oz/85 g) / 1 g Dairy / Saturated Fat (g)  Avoid: Whole milk (1 cup) / 5 g  Choose: Low-fat milk, 2% (1 cup) / 3 g  Choose: Low-fat milk, 1% (1 cup) / 1.5 g  Choose: Skim milk (1 cup) / 0.3 g  Avoid: Hard cheese (1 oz/28 g) / 6 g  Choose: Skim milk cheese (1 oz/28 g) / 2 to 3 g  Avoid: Cottage cheese, 4% fat (1 cup) / 6.5 g  Choose: Low-fat cottage cheese, 1% fat (1 cup) / 1.5 g  Avoid: Ice cream (1 cup) / 9 g  Choose: Sherbet (1 cup) / 2.5 g  Choose: Nonfat frozen yogurt (1 cup) / 0.3 g  Choose: Frozen fruit bar / trace  Avoid: Whipped cream  (1 tbs) / 3.5 g  Choose: Nondairy whipped topping (1 tbs) / 1 g Condiments / Saturated Fat (g)  Avoid: Mayonnaise (1 tbs) / 2 g  Choose: Low-fat mayonnaise (1 tbs) / 1 g  Avoid: Butter (1 tbs) / 7 g  Choose: Extra light margarine (1 tbs) / 1 g  Avoid: Coconut oil (1 tbs) / 11.8 g  Choose: Olive oil (1 tbs) / 1.8 g  Choose: Corn oil (1 tbs) / 1.7 g  Choose: Safflower oil (1 tbs) / 1.2 g  Choose: Sunflower oil (1 tbs) / 1.4 g  Choose: Soybean oil (1 tbs) / 2.4 g  Choose: Canola oil (  1 tbs) / 1 g Document Released: 04/16/2005 Document Revised: 07/09/2011 Document Reviewed: 10/05/2010 [facility] Patient Information 2013 [place], [place].

## 2012-08-06 ENCOUNTER — Encounter: Payer: Self-pay | Admitting: Internal Medicine

## 2012-08-06 ENCOUNTER — Ambulatory Visit (INDEPENDENT_AMBULATORY_CARE_PROVIDER_SITE_OTHER): Payer: 59 | Admitting: Internal Medicine

## 2012-08-06 VITALS — BP 150/90 | HR 82 | Temp 98.4°F | Resp 20 | Wt 144.0 lb

## 2012-08-06 DIAGNOSIS — I10 Essential (primary) hypertension: Secondary | ICD-10-CM

## 2012-08-06 DIAGNOSIS — I679 Cerebrovascular disease, unspecified: Secondary | ICD-10-CM

## 2012-08-06 DIAGNOSIS — E785 Hyperlipidemia, unspecified: Secondary | ICD-10-CM

## 2012-08-06 DIAGNOSIS — E039 Hypothyroidism, unspecified: Secondary | ICD-10-CM

## 2012-08-06 LAB — TSH: TSH: 2.64 u[IU]/mL (ref 0.35–5.50)

## 2012-08-06 LAB — LIPID PANEL
Cholesterol: 187 mg/dL (ref 0–200)
LDL Cholesterol: 116 mg/dL — ABNORMAL HIGH (ref 0–99)

## 2012-08-06 MED ORDER — LOSARTAN POTASSIUM 100 MG PO TABS: 100.0000 mg | ORAL_TABLET | Freq: Every day | ORAL | Status: DC

## 2012-08-06 MED ORDER — SIMVASTATIN 20 MG PO TABS: 20.0000 mg | ORAL_TABLET | Freq: Every day | ORAL | Status: DC

## 2012-08-06 MED ORDER — ALPRAZOLAM 0.25 MG PO TABS: 0.2500 mg | ORAL_TABLET | Freq: Two times a day (BID) | ORAL | Status: DC | PRN

## 2012-08-06 NOTE — Patient Instructions (Signed)
Limit your sodium (Salt) intake  Please check your blood pressure on a regular basis.  If it is consistently greater than 150/90, please make an office appointment.    It is important that you exercise regularly, at least 20 minutes 3 to 4 times per week.  If you develop chest pain or shortness of breath seek  medical attention.  Return in 6 months for follow-up  

## 2012-08-06 NOTE — Progress Notes (Signed)
Subjective:    Patient ID: Erica Young, female    DOB: 1940/12/29, 72 y.o.   MRN: [id number]  HPI  72 year old patient who is seen today for her six-month followup. She experienced myalgias on Lipitor in the distal medicine has been discontinued. She now feels well. She wishes to try simvastatin. She does have a history of dyslipidemia as well as cerebrovascular disease. She has treated hypertension and a history of anxiety disorder. She does use alprazolam twice daily. She has hypothyroidism and her TSH was slightly elevated last visit. We'll recheck today  Past Medical History  Diagnosis Date  . HYPOTHYROIDISM 03/03/2010  . HYPERLIPIDEMIA 03/03/2010  . CEREBROVASCULAR DISEASE 03/03/2010  . DIVERTICULOSIS, COLON 03/03/2010  . Low back pain   . Right leg pain   . History of cerebral artery stenosis     right middle  . Cancer     right breat ca  , lumpectomy and radiation tx (declined chemo and additional prophylactic meds)  . Mastoiditis     noted on brain MRI  . Foramen ovale     positive bubble study  . Diverticulitis   . Fatty liver 08/02/09    as per U/S done by [facility]  . Internal hemorrhoid   . Stroke   . Anxiety   . Hypertension   . Ulcer     History   Social History  . Marital Status: Married    Spouse Name: N/A    Number of Children: 2  . Years of Education: N/A   Occupational History  . Social Worker--Retired    Social History Main Topics  . Smoking status: Former Games developer  . Smokeless tobacco: Never Used  . Alcohol Use: No  . Drug Use: No  . Sexually Active: Yes   Other Topics Concern  . Not on file   Social History Narrative   Daily caffeine     Past Surgical History  Procedure Laterality Date  . Cholecystectomy    . Breast lumpectomy      right  . Tonsillectomy      Family History  Problem Relation Age of Onset  . Heart disease Sister     cerebral vascular disease also  . Heart disease Brother     cerebral vascular disease  also  . Heart disease Brother     cerebral vascular disease  . Heart disease Sister     cebreal vascular disease also  . Heart disease Sister     cebreal vascular diease also  . Heart disease Sister     cerebral vascular diease also  . Colon cancer Neg Hx   . Esophageal cancer Neg Hx   . Rectal cancer Neg Hx   . Stomach cancer Neg Hx     Allergies  Allergen Reactions  . Ciprofloxacin Anaphylaxis    Current Outpatient Prescriptions on File Prior to Visit  Medication Sig Dispense Refill  . ALPRAZolam (XANAX) 0.25 MG tablet Take 1 tablet (0.25 mg total) by mouth 2 (two) times daily as needed.  60 tablet  5  . aspirin 81 MG tablet Take 81 mg by mouth daily.      Marland Kitchen dicyclomine (BENTYL) 10 MG capsule Take 1 capsule (10 mg total) by mouth 2 (two) times daily.  90 capsule  1  . losartan (COZAAR) 100 MG tablet Take 1 tablet (100 mg total) by mouth daily.  90 tablet  3   No current facility-administered medications on file prior to visit.  BP 150/90  Pulse 82  Temp(Src) 98.4 F (36.9 C) (Oral)  Resp 20  Wt 144 lb (65.318 kg)  BMI 26.33 kg/m2  SpO2 98%       Review of Systems  Constitutional: Negative.   HENT: Negative for hearing loss, congestion, sore throat, rhinorrhea, dental problem, sinus pressure and tinnitus.   Eyes: Negative for pain, discharge and visual disturbance.  Respiratory: Negative for cough and shortness of breath.   Cardiovascular: Negative for chest pain, palpitations and leg swelling.  Gastrointestinal: Negative for nausea, vomiting, abdominal pain, diarrhea, constipation, blood in stool and abdominal distention.  Genitourinary: Negative for dysuria, urgency, frequency, hematuria, flank pain, vaginal bleeding, vaginal discharge, difficulty urinating, vaginal pain and pelvic pain.  Musculoskeletal: Negative for joint swelling, arthralgias and gait problem.  Skin: Negative for rash.  Neurological: Negative for dizziness, syncope, speech difficulty,  weakness, numbness and headaches.  Hematological: Negative for adenopathy.  Psychiatric/Behavioral: Negative for behavioral problems, dysphoric mood and agitation. The patient is nervous/anxious.        Objective:   Physical Exam  Constitutional: She is oriented to person, place, and time. She appears well-developed and well-nourished.  HENT:  Head: Normocephalic.  Right Ear: External ear normal.  Left Ear: External ear normal.  Mouth/Throat: Oropharynx is clear and moist.  Eyes: Conjunctivae and EOM are normal. Pupils are equal, round, and reactive to light.  Neck: Normal range of motion. Neck supple. No thyromegaly present.  Cardiovascular: Normal rate, regular rhythm, normal heart sounds and intact distal pulses.   Pulmonary/Chest: Effort normal and breath sounds normal.  Abdominal: Soft. Bowel sounds are normal. She exhibits no mass. There is no tenderness.  Musculoskeletal: Normal range of motion.  Lymphadenopathy:    She has no cervical adenopathy.  Neurological: She is alert and oriented to person, place, and time.  Skin: Skin is warm and dry. No rash noted.  Psychiatric: She has a normal mood and affect. Her behavior is normal.          Assessment & Plan:   Hypertension. Reasonable control. Blood pressure high normal. We'll continue present regimen Anxiety disorder. Will continue alprazolam when necessary Dyslipidemia. We'll check a lipid profile. We'll try simvastatin. Samples dispensed as well as a prescription  CPX 6 months

## 2012-08-07 ENCOUNTER — Ambulatory Visit: Payer: 59 | Admitting: Internal Medicine

## 2012-09-15 ENCOUNTER — Encounter: Payer: Self-pay | Admitting: Internal Medicine

## 2012-09-15 ENCOUNTER — Ambulatory Visit (INDEPENDENT_AMBULATORY_CARE_PROVIDER_SITE_OTHER): Payer: 59 | Admitting: Internal Medicine

## 2012-09-15 VITALS — BP 102/70 | HR 72 | Ht 62.0 in | Wt 142.0 lb

## 2012-09-15 DIAGNOSIS — R1013 Epigastric pain: Secondary | ICD-10-CM

## 2012-09-15 DIAGNOSIS — K589 Irritable bowel syndrome without diarrhea: Secondary | ICD-10-CM

## 2012-09-15 MED ORDER — ALPRAZOLAM 0.25 MG PO TABS: 0.2500 mg | ORAL_TABLET | Freq: Two times a day (BID) | ORAL | Status: DC | PRN

## 2012-09-15 NOTE — Patient Instructions (Addendum)
We have sent the following medications to your pharmacy for you to pick up at your convenience: Xanax  Cc: Dr. Amador Cunas

## 2012-09-15 NOTE — Progress Notes (Signed)
Erica Young 19-Dec-1940 MRN [id number]        History of Present Illness:  This is a 72 year old white female with symptomatic diverticulosis and irritable bowel syndrome. She had a colonoscopy in [place] in 2010 and again in December 2013 by me. She has moderately severe diverticulosis. She was much improved on a combination of probiotic, Xanax 0.25 mg twice a day , Bentyl 10 mg twice a day. and flax seed  daily. She is doing reasonably well except for constipation.She has started senna tea with good results and is asking how often she can take it. She also has occasional morning  indigestion  and would like to take Rolaids for it but  Antacids are not recommended to take with Bentyl.   Past Medical History  Diagnosis Date  . HYPOTHYROIDISM 03/03/2010  . HYPERLIPIDEMIA 03/03/2010  . CEREBROVASCULAR DISEASE 03/03/2010  . DIVERTICULOSIS, COLON 03/03/2010  . Low back pain   . Right leg pain   . History of cerebral artery stenosis     right middle  . Cancer     right breat ca  , lumpectomy and radiation tx (declined chemo and additional prophylactic meds)  . Mastoiditis     noted on brain MRI  . Foramen ovale     positive bubble study  . Diverticulitis   . Fatty liver 08/02/09    as per U/S done by [facility]  . Internal hemorrhoid   . Stroke   . Anxiety   . Hypertension   . Ulcer    Past Surgical History  Procedure Laterality Date  . Cholecystectomy    . Breast lumpectomy      right  . Tonsillectomy      reports that she has quit smoking. She has never used smokeless tobacco. She reports that she does not drink alcohol or use illicit drugs. family history includes Heart disease in her brothers and sisters.  There is no history of Colon cancer, and Esophageal cancer, and Rectal cancer, and Stomach cancer, . Allergies  Allergen Reactions  . Ciprofloxacin Anaphylaxis        Review of Systems: Occasional heartburn and indigestion. History of cholecystectomy  The remainder of the 10 point ROS is negative except as outlined in H&P   Physical Exam: General appearance  Well developed, in no distress. Psychological normal mood and affect.  Assessment and Plan:  72 year old white female with the symptomatic diverticulosis and irritable bowel syndrome with predominant constipation. She will increase her senna tea to 3 times a week. She will continue on flax seed and will discontinue Bentyl since it may be contributing to constipation. Instead ,she will take probiotic and Xanax 0.25 mg in the morning and 0.25 mg at bedtime and we will refill her prescription for Xanax. I will see her in one year Dyspepsia: Status post remote cholecystectomy, have discussed over-the-counter antacids or Pepcid when necessary. If symptoms continue consider upper endoscopy  09/15/2012 [name]

## 2012-09-16 ENCOUNTER — Encounter: Payer: Self-pay | Admitting: Internal Medicine

## 2012-09-17 ENCOUNTER — Other Ambulatory Visit: Payer: Self-pay | Admitting: Internal Medicine

## 2012-11-24 ENCOUNTER — Encounter: Payer: Self-pay | Admitting: Internal Medicine

## 2012-11-24 ENCOUNTER — Ambulatory Visit (INDEPENDENT_AMBULATORY_CARE_PROVIDER_SITE_OTHER): Payer: 59 | Admitting: Internal Medicine

## 2012-11-24 VITALS — BP 116/70 | HR 84 | Temp 97.9°F | Resp 20 | Wt 137.0 lb

## 2012-11-24 DIAGNOSIS — I10 Essential (primary) hypertension: Secondary | ICD-10-CM

## 2012-11-24 DIAGNOSIS — E785 Hyperlipidemia, unspecified: Secondary | ICD-10-CM

## 2012-11-24 DIAGNOSIS — I679 Cerebrovascular disease, unspecified: Secondary | ICD-10-CM

## 2012-11-24 DIAGNOSIS — E039 Hypothyroidism, unspecified: Secondary | ICD-10-CM

## 2012-11-24 DIAGNOSIS — R109 Unspecified abdominal pain: Secondary | ICD-10-CM

## 2012-11-24 NOTE — Progress Notes (Signed)
Subjective:    Patient ID: Erica Young, female    DOB: 1940/07/24, 72 y.o.   MRN: [id number]  HPI  72 year old patient who is seen today with a chief complaint of worsening abdominal pain over the past 2 weeks. She has been followed by GI for diverticular disease as well as IBS. She describes paroxysms of pain they're often nocturnal and describes her as sharp in the epigastric area. She has had some nausea but no vomiting. She has had some constipation issues in the past and presently her bowel habits are stable. No fever. She states that she has a history of a documented peptic ulcer greater than 20 years ago that was treated with Zantac  Past Medical History  Diagnosis Date  . HYPOTHYROIDISM 03/03/2010  . HYPERLIPIDEMIA 03/03/2010  . CEREBROVASCULAR DISEASE 03/03/2010  . DIVERTICULOSIS, COLON 03/03/2010  . Low back pain   . Right leg pain   . History of cerebral artery stenosis     right middle  . Cancer     right breat ca  , lumpectomy and radiation tx (declined chemo and additional prophylactic meds)  . Mastoiditis     noted on brain MRI  . Foramen ovale     positive bubble study  . Diverticulitis   . Fatty liver 08/02/09    as per U/S done by [facility]  . Internal hemorrhoid   . Stroke   . Anxiety   . Hypertension   . Ulcer     History   Social History  . Marital Status: Married    Spouse Name: N/A    Number of Children: 2  . Years of Education: N/A   Occupational History  . Social Worker--Retired    Social History Main Topics  . Smoking status: Former Games developer  . Smokeless tobacco: Never Used  . Alcohol Use: No  . Drug Use: No  . Sexually Active: Yes   Other Topics Concern  . Not on file   Social History Narrative   Daily caffeine     Past Surgical History  Procedure Laterality Date  . Cholecystectomy    . Breast lumpectomy      right  . Tonsillectomy      Family History  Problem Relation Age of Onset  . Heart disease Sister    cerebral vascular disease also  . Heart disease Brother     cerebral vascular disease also  . Heart disease Brother     cerebral vascular disease  . Heart disease Sister     cebreal vascular disease also  . Heart disease Sister     cebreal vascular diease also  . Heart disease Sister     cerebral vascular diease also  . Colon cancer Neg Hx   . Esophageal cancer Neg Hx   . Rectal cancer Neg Hx   . Stomach cancer Neg Hx     Allergies  Allergen Reactions  . Ciprofloxacin Anaphylaxis    Current Outpatient Prescriptions on File Prior to Visit  Medication Sig Dispense Refill  . ALPRAZolam (XANAX) 0.25 MG tablet Take 1 tablet (0.25 mg total) by mouth 2 (two) times daily as needed.  60 tablet  5  . aspirin 81 MG tablet Take 81 mg by mouth daily.      . Flaxseed, Linseed, (FLAX SEEDS PO) Take by mouth once. Daily before supper      . losartan (COZAAR) 100 MG tablet Take 1 tablet (100 mg total) by mouth daily.  90 tablet  3  . simvastatin (ZOCOR) 20 MG tablet Take 1 tablet (20 mg total) by mouth at bedtime.  90 tablet  3  . Wheat Dextrin (BENEFIBER PO) Take by mouth as needed. With prunes       No current facility-administered medications on file prior to visit.    BP 116/70  Pulse 84  Temp(Src) 97.9 F (36.6 C) (Oral)  Resp 20  Wt 137 lb (62.143 kg)  BMI 25.05 kg/m2  SpO2 96%       Review of Systems  Constitutional: Negative.   HENT: Negative for hearing loss, congestion, sore throat, rhinorrhea, dental problem, sinus pressure and tinnitus.   Eyes: Negative for pain, discharge and visual disturbance.  Respiratory: Negative for cough and shortness of breath.   Cardiovascular: Negative for chest pain, palpitations and leg swelling.  Gastrointestinal: Positive for abdominal pain. Negative for nausea, vomiting, diarrhea, constipation, blood in stool and abdominal distention.  Genitourinary: Negative for dysuria, urgency, frequency, hematuria, flank pain, vaginal bleeding,  vaginal discharge, difficulty urinating, vaginal pain and pelvic pain.  Musculoskeletal: Negative for joint swelling, arthralgias and gait problem.  Skin: Negative for rash.  Neurological: Negative for dizziness, syncope, speech difficulty, weakness, numbness and headaches.  Hematological: Negative for adenopathy.  Psychiatric/Behavioral: Negative for behavioral problems, dysphoric mood and agitation. The patient is not nervous/anxious.        Objective:   Physical Exam  Constitutional: She is oriented to person, place, and time. She appears well-developed and well-nourished.  HENT:  Head: Normocephalic.  Right Ear: External ear normal.  Left Ear: External ear normal.  Mouth/Throat: Oropharynx is clear and moist.  Eyes: Conjunctivae and EOM are normal. Pupils are equal, round, and reactive to light.  Neck: Normal range of motion. Neck supple. No thyromegaly present.  Cardiovascular: Normal rate, regular rhythm, normal heart sounds and intact distal pulses.   Pulmonary/Chest: Effort normal and breath sounds normal.  Abdominal: Soft. Bowel sounds are normal. She exhibits no distension and no mass. There is no tenderness. There is no rebound and no guarding.  Musculoskeletal: Normal range of motion.  Lymphadenopathy:    She has no cervical adenopathy.  Neurological: She is alert and oriented to person, place, and time.  Skin: Skin is warm and dry. No rash noted.  Psychiatric: She has a normal mood and affect. Her behavior is normal.          Assessment & Plan:   Recurrent episodic abdominal pain. Patient's clinical exam is unremarkable. Her bowel habits are now regular. Will place on short-term PPI therapy empirically and the patient will resume t Bentyl prn  and hold antacid therapy.  We'll also check a H. pylori antibody screen

## 2012-11-24 NOTE — Patient Instructions (Signed)
Avoids foods high in acid such as tomatoes citrus juices, and spicy foods.  Avoid eating within two hours of lying down or before exercising.  Do not overheat.  Try smaller more frequent meals.  If symptoms persist, elevate the head of her bed 12 inches while sleeping.  Dexilant one daily  Resume Bentyl  (Hold antacids)

## 2012-11-25 LAB — H. PYLORI ANTIBODY, IGG: H Pylori IgG: NEGATIVE

## 2012-11-28 ENCOUNTER — Telehealth: Payer: Self-pay | Admitting: Internal Medicine

## 2012-11-28 NOTE — Telephone Encounter (Signed)
Blood test was negative suggesting no prior history of peptic ulcer disease Return office visit next week or followup with GI if these are her predominant symptoms

## 2012-11-28 NOTE — Telephone Encounter (Addendum)
Pt still do feel good and want to know what she should do now'.She also want her lab results. Pt want a call back today at this #[phone].

## 2012-12-01 NOTE — Telephone Encounter (Signed)
Spoke to pt told her blood test was negative suggesting no prior hx of peptic ulcer disease. Also follow up with Dr. Kirtland Bouchard this week or GI.  Pt verbalized understanding and stated would like to follow up with Dr.K. Told pt okay, scheduled pt to come in on Thurs at 11:00 am with Dr. Kirtland Bouchard Pt verbalized understanding.

## 2012-12-04 ENCOUNTER — Ambulatory Visit: Payer: 59 | Admitting: Internal Medicine

## 2012-12-15 ENCOUNTER — Encounter: Payer: Self-pay | Admitting: Internal Medicine

## 2012-12-18 ENCOUNTER — Encounter: Payer: Self-pay | Admitting: Internal Medicine

## 2012-12-18 ENCOUNTER — Ambulatory Visit (INDEPENDENT_AMBULATORY_CARE_PROVIDER_SITE_OTHER): Payer: 59 | Admitting: Internal Medicine

## 2012-12-18 VITALS — BP 110/80 | HR 85 | Temp 98.1°F | Wt 139.0 lb

## 2012-12-18 DIAGNOSIS — R109 Unspecified abdominal pain: Secondary | ICD-10-CM

## 2012-12-18 DIAGNOSIS — I10 Essential (primary) hypertension: Secondary | ICD-10-CM

## 2012-12-18 DIAGNOSIS — E785 Hyperlipidemia, unspecified: Secondary | ICD-10-CM

## 2012-12-18 DIAGNOSIS — R110 Nausea: Secondary | ICD-10-CM

## 2012-12-18 MED ORDER — PROMETHAZINE HCL 25 MG PO TABS: 25.0000 mg | ORAL_TABLET | Freq: Three times a day (TID) | ORAL | Status: DC | PRN

## 2012-12-18 NOTE — Progress Notes (Signed)
Subjective:    Patient ID: Erica Young, female    DOB: Apr 06, 1941, 72 y.o.   MRN: [id number]  HPI   72 year old patient who has a history of diverticular disease as well as suspected IBS. She has treated hypertension which has been stable. She continues to have the epigastric pain that she describes as fairly mild. Her chief complaint today is nausea that occurs the frequently. She also is described as a altered taste sensation. Medical regimen includes Benadryl when necessary which has not caused any constipation issues she is on a high fiber diet.  Past Medical History  Diagnosis Date  . HYPOTHYROIDISM 03/03/2010  . HYPERLIPIDEMIA 03/03/2010  . CEREBROVASCULAR DISEASE 03/03/2010  . DIVERTICULOSIS, COLON 03/03/2010  . Low back pain   . Right leg pain   . History of cerebral artery stenosis     right middle  . Cancer     right breat ca  , lumpectomy and radiation tx (declined chemo and additional prophylactic meds)  . Mastoiditis     noted on brain MRI  . Foramen ovale     positive bubble study  . Diverticulitis   . Fatty liver 08/02/09    as per U/S done by [facility]  . Internal hemorrhoid   . Stroke   . Anxiety   . Hypertension   . Ulcer     History   Social History  . Marital Status: Married    Spouse Name: N/A    Number of Children: 2  . Years of Education: N/A   Occupational History  . Social Worker--Retired    Social History Main Topics  . Smoking status: Former Games developer  . Smokeless tobacco: Never Used  . Alcohol Use: No  . Drug Use: No  . Sexual Activity: Yes   Other Topics Concern  . Not on file   Social History Narrative   Daily caffeine     Past Surgical History  Procedure Laterality Date  . Cholecystectomy    . Breast lumpectomy      right  . Tonsillectomy      Family History  Problem Relation Age of Onset  . Heart disease Sister     cerebral vascular disease also  . Heart disease Brother     cerebral vascular disease also  .  Heart disease Brother     cerebral vascular disease  . Heart disease Sister     cebreal vascular disease also  . Heart disease Sister     cebreal vascular diease also  . Heart disease Sister     cerebral vascular diease also  . Colon cancer Neg Hx   . Esophageal cancer Neg Hx   . Rectal cancer Neg Hx   . Stomach cancer Neg Hx     Allergies  Allergen Reactions  . Ciprofloxacin Anaphylaxis    Current Outpatient Prescriptions on File Prior to Visit  Medication Sig Dispense Refill  . ALPRAZolam (XANAX) 0.25 MG tablet Take 1 tablet (0.25 mg total) by mouth 2 (two) times daily as needed.  60 tablet  5  . aspirin 81 MG tablet Take 81 mg by mouth daily.      . Flaxseed, Linseed, (FLAX SEEDS PO) Take by mouth once. Daily before supper      . losartan (COZAAR) 100 MG tablet Take 1 tablet (100 mg total) by mouth daily.  90 tablet  3  . Probiotic Product (PROBIOTIC DAILY PO) Take 1 tablet by mouth daily.      Marland Kitchen  simvastatin (ZOCOR) 20 MG tablet Take 1 tablet (20 mg total) by mouth at bedtime.  90 tablet  3   No current facility-administered medications on file prior to visit.    BP 110/80  Pulse 85  Temp(Src) 98.1 F (36.7 C) (Oral)  Wt 139 lb (63.05 kg)  BMI 25.42 kg/m2  SpO2 97%       Review of Systems  Constitutional: Negative.   HENT: Negative for hearing loss, congestion, sore throat, rhinorrhea, dental problem, sinus pressure and tinnitus.   Eyes: Negative for pain, discharge and visual disturbance.  Respiratory: Negative for cough and shortness of breath.   Cardiovascular: Negative for chest pain, palpitations and leg swelling.  Gastrointestinal: Positive for nausea and abdominal pain. Negative for vomiting, diarrhea, constipation, blood in stool and abdominal distention.  Genitourinary: Negative for dysuria, urgency, frequency, hematuria, flank pain, vaginal bleeding, vaginal discharge, difficulty urinating, vaginal pain and pelvic pain.  Musculoskeletal: Negative for  joint swelling, arthralgias and gait problem.  Skin: Negative for rash.  Neurological: Negative for dizziness, syncope, speech difficulty, weakness, numbness and headaches.  Hematological: Negative for adenopathy.  Psychiatric/Behavioral: Negative for behavioral problems, dysphoric mood and agitation. The patient is not nervous/anxious.        Objective:   Physical Exam  Constitutional: She is oriented to person, place, and time. She appears well-developed and well-nourished.  HENT:  Head: Normocephalic.  Right Ear: External ear normal.  Left Ear: External ear normal.  Mouth/Throat: Oropharynx is clear and moist.  Eyes: Conjunctivae and EOM are normal. Pupils are equal, round, and reactive to light.  Neck: Normal range of motion. Neck supple. No thyromegaly present.  Cardiovascular: Normal rate, regular rhythm, normal heart sounds and intact distal pulses.   Pulmonary/Chest: Effort normal and breath sounds normal.  Abdominal: Soft. Bowel sounds are normal. She exhibits no distension and no mass. There is no tenderness. There is no rebound and no guarding.  Musculoskeletal: Normal range of motion.  Lymphadenopathy:    She has no cervical adenopathy.  Neurological: She is alert and oriented to person, place, and time.  Skin: Skin is warm and dry. No rash noted.  Psychiatric: She has a normal mood and affect. Her behavior is normal.          Assessment & Plan:   Abdominal pain. This seems fairly mild and not too bothersome. Her more prominent symptom today appears to be nausea. Do to a history of peptic ulcer disease and recurrent epigastric pain and H. pylori screen was obtained one month ago and was negative Hypertension stable Nausea. We'll treat symptomatically

## 2012-12-18 NOTE — Patient Instructions (Signed)
High fiber diet  Limit your sodium (Salt) intake    It is important that you exercise regularly, at least 20 minutes 3 to 4 times per week.  If you develop chest pain or shortness of breath seek  medical attention.  GI followup if unimproved  Return in 6 months for follow-up

## 2013-01-09 ENCOUNTER — Ambulatory Visit (INDEPENDENT_AMBULATORY_CARE_PROVIDER_SITE_OTHER): Payer: 59 | Admitting: Family Medicine

## 2013-01-09 ENCOUNTER — Encounter: Payer: Self-pay | Admitting: Family Medicine

## 2013-01-09 VITALS — BP 120/78 | Temp 98.3°F | Wt 138.0 lb

## 2013-01-09 DIAGNOSIS — J329 Chronic sinusitis, unspecified: Secondary | ICD-10-CM

## 2013-01-09 MED ORDER — AMOXICILLIN 875 MG PO TABS: 875.0000 mg | ORAL_TABLET | Freq: Two times a day (BID) | ORAL | Status: DC

## 2013-01-09 NOTE — Patient Instructions (Signed)
INSTRUCTIONS FOR UPPER RESPIRATORY INFECTION:  -plenty of rest and fluids  -As we discussed, we have prescribed a new medication (AMOXICILLIN) for you at this appointment. We discussed the common and serious potential adverse effects of this medication and you can review these and more with the pharmacist when you pick up your medication.  Please follow the instructions for use carefully and notify us immediately if you have any problems taking this medication.  -nasal saline wash 2-3 times daily (use prepackaged nasal saline or bottled/distilled water if making your own)   -can use sinex or afrin nasal spray for drainage and nasal congestion - but do NOT use longer then 3-4 days  -can use tylenol or ibuprofen as directed for aches and sorethroat  -if you are taking a cough medication - use only as directed, may also try a teaspoon of honey to coat the throat and throat lozenges  -for sore throat, salt water gargles can help  -follow up if you have fevers, facial pain, tooth pain, difficulty breathing or are worsening or not getting better in 5-7 days

## 2013-01-09 NOTE — Progress Notes (Signed)
Chief Complaint  Patient presents with  . Cough  . head congetion    HPI:  Ms. Erica Young, a 72 yo patient of Dr. Amador Cunas, is here for an acute visit for:  1) Sinus congestion: -started about 1 week ago but getting worse -symptoms: sinus congestion, sinus pain, cough -denies: fevers, NVD, tooth pain, SOB -grandson sick recently -has tried tylenol, sudaphed   ROS: See pertinent positives and negatives per HPI.  Past Medical History  Diagnosis Date  . HYPOTHYROIDISM 03/03/2010  . HYPERLIPIDEMIA 03/03/2010  . CEREBROVASCULAR DISEASE 03/03/2010  . DIVERTICULOSIS, COLON 03/03/2010  . Low back pain   . Right leg pain   . History of cerebral artery stenosis     right middle  . Cancer     right breat ca  , lumpectomy and radiation tx (declined chemo and additional prophylactic meds)  . Mastoiditis     noted on brain MRI  . Foramen ovale     positive bubble study  . Diverticulitis   . Fatty liver 08/02/09    as per U/S done by [facility]  . Internal hemorrhoid   . Stroke   . Anxiety   . Hypertension   . Ulcer     Past Surgical History  Procedure Laterality Date  . Cholecystectomy    . Breast lumpectomy      right  . Tonsillectomy      Family History  Problem Relation Age of Onset  . Heart disease Sister     cerebral vascular disease also  . Heart disease Brother     cerebral vascular disease also  . Heart disease Brother     cerebral vascular disease  . Heart disease Sister     cebreal vascular disease also  . Heart disease Sister     cebreal vascular diease also  . Heart disease Sister     cerebral vascular diease also  . Colon cancer Neg Hx   . Esophageal cancer Neg Hx   . Rectal cancer Neg Hx   . Stomach cancer Neg Hx     History   Social History  . Marital Status: Married    Spouse Name: N/A    Number of Children: 2  . Years of Education: N/A   Occupational History  . Social Worker--Retired    Social History Main Topics  .  Smoking status: Former Games developer  . Smokeless tobacco: Never Used  . Alcohol Use: No  . Drug Use: No  . Sexual Activity: Yes   Other Topics Concern  . None   Social History Narrative   Daily caffeine     Current outpatient prescriptions:ALPRAZolam (XANAX) 0.25 MG tablet, Take 1 tablet (0.25 mg total) by mouth 2 (two) times daily as needed., Disp: 60 tablet, Rfl: 5;  aspirin 81 MG tablet, Take 81 mg by mouth daily., Disp: , Rfl: ;  Flaxseed, Linseed, (FLAX SEEDS PO), Take by mouth once. Daily before supper, Disp: , Rfl: ;  losartan (COZAAR) 100 MG tablet, Take 1 tablet (100 mg total) by mouth daily., Disp: 90 tablet, Rfl: 3 Probiotic Product (PROBIOTIC DAILY PO), Take 1 tablet by mouth daily., Disp: , Rfl: ;  simvastatin (ZOCOR) 20 MG tablet, Take 1 tablet (20 mg total) by mouth at bedtime., Disp: 90 tablet, Rfl: 3;  amoxicillin (AMOXIL) 875 MG tablet, Take 1 tablet (875 mg total) by mouth 2 (two) times daily., Disp: 20 tablet, Rfl: 0 promethazine (PHENERGAN) 25 MG tablet, Take 1 tablet (25 mg  total) by mouth every 8 (eight) hours as needed for nausea., Disp: 20 tablet, Rfl: 4  EXAM:  Filed Vitals:   01/09/13 1101  BP: 120/78  Temp: 98.3 F (36.8 C)    Body mass index is 25.23 kg/(m^2).  GENERAL: vitals reviewed and listed above, alert, oriented, appears well hydrated and in no acute distress  HEENT: atraumatic, conjunttiva clear, no obvious abnormalities on inspection of external nose and ears, normal appearance of ear canals and TMs, clear thick nasal congestion, mild post oropharyngeal erythema with PND, no tonsillar edema or exudate, R max sinus TTP  NECK: no obvious masses on inspection  LUNGS: clear to auscultation bilaterally, no wheezes, rales or rhonchi, good air movement  CV: HRRR, no peripheral edema  MS: moves all extremities without noticeable abnormality  PSYCH: pleasant and cooperative, no obvious depression or anxiety  ASSESSMENT AND PLAN:  Discussed the  following assessment and plan:  Sinusitis - Plan: amoxicillin (AMOXIL) 875 MG tablet  Recommendations per orders and instructions, risks and use of medications and return precautions discussed. -Patient advised to return or notify a doctor immediately if symptoms worsen or persist or new concerns arise.  Patient Instructions  INSTRUCTIONS FOR UPPER RESPIRATORY INFECTION:  -plenty of rest and fluids  -As we discussed, we have prescribed a new medication (AMOXICILLIN) for you at this appointment. We discussed the common and serious potential adverse effects of this medication and you can review these and more with the pharmacist when you pick up your medication.  Please follow the instructions for use carefully and notify us immediately if you have any problems taking this medication.  -nasal saline wash 2-3 times daily (use prepackaged nasal saline or bottled/distilled water if making your own)   -can use sinex or afrin nasal spray for drainage and nasal congestion - but do NOT use longer then 3-4 days  -can use tylenol or ibuprofen as directed for aches and sorethroat  -if you are taking a cough medication - use only as directed, may also try a teaspoon of honey to coat the throat and throat lozenges  -for sore throat, salt water gargles can help  -follow up if you have fevers, facial pain, tooth pain, difficulty breathing or are worsening or not getting better in 5-7 days      Zhaire Locker R.

## 2013-02-02 ENCOUNTER — Other Ambulatory Visit (INDEPENDENT_AMBULATORY_CARE_PROVIDER_SITE_OTHER): Payer: 59

## 2013-02-02 DIAGNOSIS — Z0000 Encounter for general adult medical examination without abnormal findings: Secondary | ICD-10-CM

## 2013-02-02 DIAGNOSIS — I10 Essential (primary) hypertension: Secondary | ICD-10-CM

## 2013-02-02 DIAGNOSIS — E785 Hyperlipidemia, unspecified: Secondary | ICD-10-CM

## 2013-02-02 LAB — BASIC METABOLIC PANEL
CO2: 29 mEq/L (ref 19–32)
Chloride: 104 mEq/L (ref 96–112)
Glucose, Bld: 97 mg/dL (ref 70–99)
Potassium: 4.4 mEq/L (ref 3.5–5.1)
Sodium: 137 mEq/L (ref 135–145)

## 2013-02-02 LAB — CBC WITH DIFFERENTIAL/PLATELET
Basophils Relative: 0.5 % (ref 0.0–3.0)
Eosinophils Absolute: 0.3 10*3/uL (ref 0.0–0.7)
HCT: 41.9 % (ref 36.0–46.0)
Hemoglobin: 14.1 g/dL (ref 12.0–15.0)
Lymphocytes Relative: 33.2 % (ref 12.0–46.0)
Lymphs Abs: 1.9 10*3/uL (ref 0.7–4.0)
MCHC: 33.7 g/dL (ref 30.0–36.0)
MCV: 95.7 fl (ref 78.0–100.0)
Monocytes Absolute: 0.3 10*3/uL (ref 0.1–1.0)
Neutro Abs: 3.1 10*3/uL (ref 1.4–7.7)
RBC: 4.38 Mil/uL (ref 3.87–5.11)

## 2013-02-02 LAB — LIPID PANEL
Cholesterol: 146 mg/dL (ref 0–200)
LDL Cholesterol: 76 mg/dL (ref 0–99)
Total CHOL/HDL Ratio: 3

## 2013-02-02 LAB — HEPATIC FUNCTION PANEL
Albumin: 3.7 g/dL (ref 3.5–5.2)
Alkaline Phosphatase: 67 U/L (ref 39–117)
Total Protein: 6.6 g/dL (ref 6.0–8.3)

## 2013-02-02 LAB — TSH: TSH: 4.87 u[IU]/mL (ref 0.35–5.50)

## 2013-02-03 LAB — POCT URINALYSIS DIPSTICK
Protein, UA: NEGATIVE
Spec Grav, UA: 1.015
Urobilinogen, UA: 0.2

## 2013-02-09 ENCOUNTER — Encounter: Payer: Self-pay | Admitting: Internal Medicine

## 2013-02-09 ENCOUNTER — Ambulatory Visit (INDEPENDENT_AMBULATORY_CARE_PROVIDER_SITE_OTHER): Payer: 59 | Admitting: Internal Medicine

## 2013-02-09 VITALS — BP 110/70 | HR 72 | Temp 97.7°F | Resp 20 | Ht 61.5 in | Wt 140.0 lb

## 2013-02-09 DIAGNOSIS — I679 Cerebrovascular disease, unspecified: Secondary | ICD-10-CM

## 2013-02-09 DIAGNOSIS — E785 Hyperlipidemia, unspecified: Secondary | ICD-10-CM

## 2013-02-09 DIAGNOSIS — Z0000 Encounter for general adult medical examination without abnormal findings: Secondary | ICD-10-CM

## 2013-02-09 DIAGNOSIS — K5730 Diverticulosis of large intestine without perforation or abscess without bleeding: Secondary | ICD-10-CM

## 2013-02-09 DIAGNOSIS — M549 Dorsalgia, unspecified: Secondary | ICD-10-CM

## 2013-02-09 DIAGNOSIS — Z23 Encounter for immunization: Secondary | ICD-10-CM

## 2013-02-09 DIAGNOSIS — E039 Hypothyroidism, unspecified: Secondary | ICD-10-CM

## 2013-02-09 DIAGNOSIS — I10 Essential (primary) hypertension: Secondary | ICD-10-CM

## 2013-02-09 MED ORDER — SIMVASTATIN 10 MG PO TABS: 10.0000 mg | ORAL_TABLET | Freq: Every day | ORAL | Status: DC

## 2013-02-09 MED ORDER — ALPRAZOLAM 0.25 MG PO TABS: 0.2500 mg | ORAL_TABLET | Freq: Two times a day (BID) | ORAL | Status: DC | PRN

## 2013-02-09 MED ORDER — LOSARTAN POTASSIUM 100 MG PO TABS: 100.0000 mg | ORAL_TABLET | Freq: Every day | ORAL | Status: DC

## 2013-02-09 NOTE — Progress Notes (Signed)
Patient ID: Erica Young, female   DOB: Oct 08, 1940, 72 y.o.   MRN: [id number]  Patient ID: Erica Young, female   DOB: 08-Nov-1940, 72 y.o.   MRN: [id number]  Subjective:    Patient ID: Erica Young, female    DOB: [birth year], 72 y.o.   MRN: [id number]  HPI  72 year old patient who is seen today for a preventive health examination. She has a history of diverticulosis and has been evaluated by GI. She remains on a high fiber diet.  She has a history of hypothyroidism. She was on thyroid supplementation at one  point due  to a hair loss. A recent TSH  was slightly elevated. She has a history of right breast cancer and is scheduled for followup mammogram soon. She has an appointment with ophthalmology  She has a history of  anxiety and insomnia.  She has a history of cerebral vascular disease and denies any focal neurological symptoms. She has treated hypertension. She has been on statin therapy due  to her cerebrovascular disease   MWV:  1. Risk factors, based on past  M,S,F history-  cardiovascular risk factors include a history of hypertension and dyslipidemia. She does have a history of cerebrovascular disease  2.  Physical activities: No limitations remains active  3.  Depression/mood: No history of depression or mood disorder  4.  Hearing: No deficits  5.  ADL's: Independent in all aspects of daily living 6.  Fall risk: Low  7.  Home safety: No problems identified  8.  Height weight, and visual acuity; height and weight stable. No change in visual acuity.  9.  Counseling: None appropriate at this time  10. Lab orders based on risk factors: Laboratory profile including TSH and lipid profile will be reviewed 11. Referral : She is status post right breast lumpectomy for breast cancer 5 years ago needs followup mammogram. Needs a gynecologic referral  12. Care plan: Mammogram and GYN referral low-salt diet encouraged as well as regular exercise 13. 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  patent foramen ovale with positive bubble study  right middle cerebral artery stenosis  History of right breast cancer, status post lumpectomy and radiation treatment ( declined chemotherapy and additional prophylactic medication)  Hypothyroidism  mastoiditis noted on brain MRI  Diverticulosis, colon  Hyperlipidemia  low back and right leg pain   Family History:   father died in his early 68s from gastric cancer  mother died of complications of cerebrovascular disease in her early 71s  two brothers 4 sisters-positive for coronary artery disease and cerebrovascular disease   Past Medical History  Diagnosis Date  . HYPOTHYROIDISM 03/03/2010  . HYPERLIPIDEMIA 03/03/2010  . CEREBROVASCULAR DISEASE 03/03/2010  . DIVERTICULOSIS, COLON 03/03/2010  . Low back pain   . Right leg pain   . History of cerebral artery stenosis     right middle  . Cancer     right breat ca  , lumpectomy and radiation tx (declined chemo and additional prophylactic meds)  . Mastoiditis     noted on brain MRI  . Foramen ovale     positive bubble study  . Diverticulitis   . Fatty liver 08/02/09    as per U/S done by [facility]  . Internal hemorrhoid   . Stroke   . Anxiety   .  Hypertension   . Ulcer    Past Surgical History  Procedure Laterality Date  . Cholecystectomy    . Breast lumpectomy      right  . Tonsillectomy      reports that she has quit smoking. She has never used smokeless tobacco. She reports that she does not drink alcohol or use illicit drugs. family history includes Heart disease in her brother, brother, sister, sister, sister, and sister. There is no history of Colon cancer, Esophageal cancer, Rectal cancer, or Stomach cancer. Allergies  Allergen Reactions  . Ciprofloxacin Anaphylaxis     Review of  Systems  Constitutional: Negative for fever, appetite change, fatigue and unexpected weight change.  HENT: Negative for congestion, dental problem, ear pain, hearing loss, mouth sores, nosebleeds, sinus pressure, sore throat, tinnitus, trouble swallowing and voice change.   Eyes: Negative for photophobia, pain, redness and visual disturbance.  Respiratory: Negative for cough, chest tightness and shortness of breath.   Cardiovascular: Negative for chest pain, palpitations and leg swelling.  Gastrointestinal: Negative for nausea, vomiting, abdominal pain, diarrhea, constipation, blood in stool, abdominal distention and rectal pain.  Genitourinary: Negative for dysuria, urgency, frequency, hematuria, flank pain, vaginal bleeding, vaginal discharge, difficulty urinating, genital sores, vaginal pain, menstrual problem and pelvic pain.  Musculoskeletal: Negative for arthralgias, back pain and neck stiffness.  Skin: Negative for rash.  Neurological: Negative for dizziness, syncope, speech difficulty, weakness, light-headedness, numbness and headaches.  Hematological: Negative for adenopathy. Does not bruise/bleed easily.  Psychiatric/Behavioral: Negative for suicidal ideas, behavioral problems, self-injury, dysphoric mood and agitation. The patient is not nervous/anxious.        Objective:   Physical Exam  Constitutional: She is oriented to person, place, and time. She appears well-developed and well-nourished.  HENT:  Head: Normocephalic and atraumatic.  Right Ear: External ear normal.  Left Ear: External ear normal.  Mouth/Throat: Oropharynx is clear and moist.  Eyes: Conjunctivae and EOM are normal.  Neck: Normal range of motion. Neck supple. No JVD present. No thyromegaly present.  Cardiovascular: Normal rate, regular rhythm, normal heart sounds and intact distal pulses.   No murmur heard. Pulmonary/Chest: Effort normal and breath sounds normal. She has no wheezes. She has no rales.   Incision right lateral breast well-healed. Breast exam unremarkable. Axilla clear  Abdominal: Soft. Bowel sounds are normal. She exhibits no distension and no mass. There is no tenderness. There is no rebound and no guarding.  Genitourinary: Vagina normal.  Musculoskeletal: Normal range of motion. She exhibits no edema and no tenderness.  Neurological: She is alert and oriented to person, place, and time. She has normal reflexes. No cranial nerve deficit. She exhibits normal muscle tone. Coordination normal.  Skin: Skin is warm and dry. No rash noted.  Psychiatric: She has a normal mood and affect. Her behavior is normal.          Assessment & Plan:   Preventive health. Followup GYN Hypertension well controlled Dyslipidemia.  will continue statin therapy  Diverticulosis. Will be maintained on a high fiber diet Cerebrovascular disease. Will continue aspirin and aggressive risk factor modification  History breast cancer. Followup mammogram   Recheck 6 months  We'll check lab update EKG  Reviewed- WNL

## 2013-02-09 NOTE — Patient Instructions (Signed)
Limit your sodium (Salt) intake  It is important that you exercise regularly, at least 20 minutes 3 to 4 times per week.  If you develop chest pain or shortness of breath seek  medical attention.  A high fiber diet with plenty of fluids (up to 8 glasses of water daily) is suggested.  Metamucil, 1 tablespoon once or twice daily can be used to keep bowels regular if needed.  Return in 6 months for follow-up

## 2013-03-05 ENCOUNTER — Encounter: Payer: Self-pay | Admitting: Internal Medicine

## 2013-03-05 ENCOUNTER — Ambulatory Visit (INDEPENDENT_AMBULATORY_CARE_PROVIDER_SITE_OTHER): Payer: 59 | Admitting: Internal Medicine

## 2013-03-05 ENCOUNTER — Telehealth: Payer: Self-pay | Admitting: Internal Medicine

## 2013-03-05 VITALS — BP 116/68 | HR 70 | Ht 63.0 in | Wt 139.0 lb

## 2013-03-05 DIAGNOSIS — R1013 Epigastric pain: Secondary | ICD-10-CM

## 2013-03-05 NOTE — Patient Instructions (Addendum)
You have been scheduled for an endoscopy with propofol. Please follow written instructions given to you at your visit today. If you use inhalers (even only as needed), please bring them with you on the day of your procedure. Your physician has requested that you go to www.startemmi.com and enter the access code given to you at your visit today. This web site gives a general overview about your procedure. However, you should still follow specific instructions given to you by our office regarding your preparation for the procedure.  CC: Dr Amador Cunas

## 2013-03-05 NOTE — Progress Notes (Signed)
Erica Young 1940/05/02 MRN [id number]  History of Present Illness:  This is a 72 year old white female with a history of symptomatic diverticulosis and irritable bowel syndrome. She is now having problems with epigastric discomfort which occurs several times a week. It is located in the epigastrium anteriorly and does not radiate to her back. It is not associated with meals. She complained of the pain to Dr. Kirtland Bouchard during her office visit in August 2014. There has been no weight loss and no melena. There is occasional nausea. She gives a history of a duodenal ulcer approximately 40 years ago when she lived in [place]. She was treated with Zantac. She is not currently taking any acid reducing medications. There is a history of a remote cholecystectomy for cholelithiasis. She denies dysphagia. She is complaining of gas and bloating.   Past Medical History  Diagnosis Date  . HYPOTHYROIDISM 03/03/2010  . HYPERLIPIDEMIA 03/03/2010  . CEREBROVASCULAR DISEASE 03/03/2010  . DIVERTICULOSIS, COLON 03/03/2010  . Low back pain   . Right leg pain   . History of cerebral artery stenosis     right middle  . Cancer     right breat ca  , lumpectomy and radiation tx (declined chemo and additional prophylactic meds)  . Mastoiditis     noted on brain MRI  . Foramen ovale     positive bubble study  . Diverticulitis   . Fatty liver 08/02/09    as per U/S done by [facility]  . Internal hemorrhoid   . Stroke   . Anxiety   . Hypertension   . Ulcer    Past Surgical History  Procedure Laterality Date  . Cholecystectomy    . Breast lumpectomy      right  . Tonsillectomy      reports that she quit smoking about 21 years ago. She has never used smokeless tobacco. She reports that she does not drink alcohol or use illicit drugs. family history includes Heart disease in her brother, brother, sister, sister, sister, and sister. There is no history of Colon cancer, Esophageal cancer, Rectal cancer, or  Stomach cancer. Allergies  Allergen Reactions  . Ciprofloxacin Anaphylaxis        Review of Systems: Denies diarrhea constipation or rectal bleeding  The remainder of the 10 point ROS is negative except as outlined in H&P   Physical Exam: General appearance  Well developed, in no distress. Eyes- non icteric. HEENT nontraumatic, normocephalic. Mouth no lesions, tongue papillated, no cheilosis. Neck supple without adenopathy, thyroid not enlarged, no carotid bruits, no JVD. Lungs Clear to auscultation bilaterally. Cor normal S1, normal S2, regular rhythm, no murmur,  quiet precordium. Abdomen: Soft abdomen with normal active bowel sounds. Tender in epigastrium. No pulsations or bruit. No palpable mass. Lower abdomen unremarkable. Liver edge at costal margin. Rectal: Not done. Extremities no pedal edema. Skin no lesions. Neurological alert and oriented x 3. Psychological normal mood and affect.  Assessment and Plan:  Problem #46 72 year old white female with new-onset epigastric pain which occurs off and on and has no association to meals. There is a history of peptic ulcer disease. Her H. pylori antibody is negative. We will proceed with an upper endoscopy for diagnosis. I have also given her samples of Zegrid 40 mg for her to take daily until we can do the endoscopy. She will continue Xanax and Bentyl for irritable bowel syndrome as well as a high fiber diet.   Problem #2, Remote cholecystectomy Problem #3  irritable bowel syndrome and symptomatic diverticulosis. Under good control   03/05/2013 [name]

## 2013-03-05 NOTE — Telephone Encounter (Signed)
Advised patient to take Zegerid once daily. She verbalizes understanding.

## 2013-03-10 ENCOUNTER — Ambulatory Visit: Payer: 59 | Admitting: Internal Medicine

## 2013-03-17 ENCOUNTER — Encounter: Payer: Self-pay | Admitting: Internal Medicine

## 2013-03-17 ENCOUNTER — Ambulatory Visit (AMBULATORY_SURGERY_CENTER): Payer: 59 | Admitting: Internal Medicine

## 2013-03-17 VITALS — BP 148/76 | HR 69 | Temp 95.0°F | Resp 19 | Ht 63.0 in | Wt 139.0 lb

## 2013-03-17 DIAGNOSIS — R1013 Epigastric pain: Secondary | ICD-10-CM

## 2013-03-17 MED ORDER — ONDANSETRON HCL 4 MG PO TABS: 4.0000 mg | ORAL_TABLET | Freq: Three times a day (TID) | ORAL | Status: DC | PRN

## 2013-03-17 MED ORDER — SODIUM CHLORIDE 0.9 % IV SOLN: 500.0000 mL | INTRAVENOUS | Status: DC

## 2013-03-17 NOTE — Progress Notes (Signed)
Called to room to assist during endoscopic procedure.  Patient ID and intended procedure confirmed with present staff. Received instructions for my participation in the procedure from the performing physician.  

## 2013-03-17 NOTE — Op Note (Signed)
[facility] [address] [place], [address]   ENDOSCOPY PROCEDURE REPORT  PATIENT: Erica, Young  MR#: [id number] BIRTHDATE: 13-Dec-1940 , 72  yrs. old GENDER: Female ENDOSCOPIST: Hart Carwin, MD REFERRED BY:  Eleonore Chiquito, M.D. PROCEDURE DATE:  03/17/2013 PROCEDURE:  EGD w/ biopsy ASA CLASS:     Class II INDICATIONS:  Epigastric pain. nausea,,history of duodenal ulcer, status post remote cholecystectomy MEDICATIONS: MAC sedation, administered by CRNA and propofol (Diprivan) 100mg  IV TOPICAL ANESTHETIC: Cetacaine Spray  DESCRIPTION OF PROCEDURE: After the risks benefits and alternatives of the procedure were thoroughly explained, informed consent was obtained.  The LB [id number] endoscope was introduced through the mouth and advanced to the second portion of the duodenum. Without limitations.  The instrument was slowly withdrawn as the mucosa was fully examined.      Esophagus: proximal mid and distal esophageal mucosa appeared normal. Squamocolumnar junction was normal. There was no stricture. There was a 3 cm hiatal hernia extending from 35-38 cm from the incisors Stomach: Gastric rugal for 2 or normal. There was minimal erythema in the prepyloric antrum. Gastric outlet was unremarkable. Retroflexion of the endoscope revealed normal fundus and cardia,multiple biopsies were taken from the gastric antrum to rule out H. pyloric Duodenum: Duodenal bulb and descending duodenum was normal[ The scope was then withdrawn from the patient and the procedure completed.  COMPLICATIONS: There were no complications. ENDOSCOPIC IMPRESSION:  3 cm nonreducible hiatal hernia Otherwise normal upper endoscopy of the esophagus stomach and duodenum with minimal antral erythema. Status post antral biopsies Nothing to account for abdominal pain  RECOMMENDATIONS: 1.  Await pathology results 2.   continue Zegrid 3. add Zofran 4 mg , #30 , prn nausea 4.  consider GES if not improved  REPEAT EXAM: for EGD pending biopsy results.  eSigned:  Hart Carwin, MD 03/17/2013 9:09 AM   CC:  PATIENT NAME:  Erica, Young MR#: [id number]

## 2013-03-17 NOTE — Progress Notes (Signed)
Patient did not experience any of the following events: a burn prior to discharge; a fall within the facility; wrong site/side/patient/procedure/implant event; or a hospital transfer or hospital admission upon discharge from the facility. (G8907) Patient did not have preoperative order for IV antibiotic SSI prophylaxis. (G8918)  

## 2013-03-17 NOTE — Patient Instructions (Signed)

## 2013-03-18 ENCOUNTER — Telehealth: Payer: Self-pay | Admitting: *Deleted

## 2013-03-18 NOTE — Telephone Encounter (Signed)
  Follow up Call-  Call back number 03/17/2013 02/13/2012  Post procedure Call Back phone  # [phone]  Permission to leave phone message Yes Yes     Patient questions:  Do you have a fever, pain , or abdominal swelling? no Pain Score  0 *  Have you tolerated food without any problems? yes  Have you been able to return to your normal activities? yes  Do you have any questions about your discharge instructions: Diet   no Medications  no Follow up visit  no  Do you have questions or concerns about your Care? no  Actions: * If pain score is 4 or above: No action needed, pain <4.

## 2013-03-23 ENCOUNTER — Encounter: Payer: Self-pay | Admitting: Internal Medicine

## 2013-04-07 ENCOUNTER — Telehealth: Payer: Self-pay | Admitting: Internal Medicine

## 2013-04-07 NOTE — Telephone Encounter (Signed)
Please call and schedule an appointment for tomorrow

## 2013-04-07 NOTE — Telephone Encounter (Signed)
Emergent Call:  Marlan/patient  Phone [phone]. Called regarding anxiety.  No answer at time of call back.

## 2013-04-07 NOTE — Telephone Encounter (Signed)
Patient Information:  Caller Name: Hally  Phone: [phone]  Patient: Erica Young  Gender: Female  DOB: [birth year]  Age: 72 Years  PCP: Eleonore Chiquito (Family Practice > 23yrs old)  Office Follow Up:  Does the office need to follow up with this patient?: Yes  Instructions For The Office: Call back to advise if MD approves office visit for 04/08/13 for anxiety.  RN Note:  Worried about family problems. Difficulty sleeping. Feels unhappy. Chest tightness present radiating to neck and shoulder. Advised to call 911 now for chest tightness present for > 5 minutes within the last hour per Anxiety Panic.  Declined to call 911; reports chest tightness is gone after taking extra dose of Xanax. Always get chest tightness with anxiety. "I have a very strong heart."  Requests apppointment for 04/08/13. Advised message will be sent to MD asking if appointment can be scheduled.  Symptoms  Reason For Call & Symptoms: Emergent Call: Anxiety. Took three doses of Xanax 03/07/13 and 04/07/13 but it is ordered for use BID.  Reviewed Health History In EMR: Yes  Reviewed Medications In EMR: Yes  Reviewed Allergies In EMR: Yes  Reviewed Surgeries / Procedures: Yes  Date of Onset of Symptoms: 04/06/2013  Treatments Tried: > Xanax dose  Treatments Tried Worked: Yes  Guideline(s) Used:  No Protocol Available - Sick Adult  Disposition Per Guideline:   Call EMS 911 Now  Reason For Disposition Reached:   Sounds like a life-threatening emergency to the triager  Advice Given:  N/A  Patient Refused Recommendation:  Patient Will Follow Up With Office Later  Requests appointment at office

## 2013-04-08 ENCOUNTER — Encounter: Payer: Self-pay | Admitting: *Deleted

## 2013-04-08 NOTE — Telephone Encounter (Signed)
Please schedule office appointment today or tomorrow

## 2013-04-08 NOTE — Telephone Encounter (Signed)
Pt requesting to be worked in today with PCP if possible, was offered appt with another provider but declined.

## 2013-04-08 NOTE — Telephone Encounter (Signed)
Pt scheduled tomorrow 12/11 at 9:15 with Dr. Kirtland Bouchard

## 2013-04-09 ENCOUNTER — Encounter: Payer: Self-pay | Admitting: Internal Medicine

## 2013-04-09 ENCOUNTER — Ambulatory Visit (INDEPENDENT_AMBULATORY_CARE_PROVIDER_SITE_OTHER): Payer: Medicare PPO | Admitting: Internal Medicine

## 2013-04-09 VITALS — BP 110/74 | HR 77 | Temp 97.6°F | Resp 18 | Wt 139.0 lb

## 2013-04-09 DIAGNOSIS — E785 Hyperlipidemia, unspecified: Secondary | ICD-10-CM

## 2013-04-09 DIAGNOSIS — I10 Essential (primary) hypertension: Secondary | ICD-10-CM

## 2013-04-09 DIAGNOSIS — R109 Unspecified abdominal pain: Secondary | ICD-10-CM

## 2013-04-09 MED ORDER — ALPRAZOLAM 0.25 MG PO TABS: ORAL_TABLET | ORAL | Status: DC

## 2013-04-09 NOTE — Patient Instructions (Signed)
Limit your sodium (Salt) intake    It is important that you exercise regularly, at least 20 minutes 3 to 4 times per week.  If you develop chest pain or shortness of breath seek  medical attention.  Return in 6 months for follow-up  Fiber Content in Foods Drinking plenty of fluids and consuming foods high in fiber can help with constipation. See the list below for the fiber content of some common foods. Starches and Grains / Dietary Fiber (g)  Cheerios, 1 cup / 3 g  Kellogg's Corn Flakes, 1 cup / 0.7 g  Rice Krispies, 1  cup / 0.3 g  Quaker Oat Life Cereal,  cup / 2.1 g  Oatmeal, instant (cooked),  cup / 2 g  Kellogg's Frosted Mini Wheats, 1 cup / 5.1 g  Rice, brown, long-grain (cooked), 1 cup / 3.5 g  Rice, white, long-grain (cooked), 1 cup / 0.6 g  Macaroni, cooked, enriched, 1 cup / 2.5 g Legumes / Dietary Fiber (g)  Beans, baked, canned, plain or vegetarian,  cup / 5.2 g  Beans, kidney, canned,  cup / 6.8 g  Beans, pinto, dried (cooked),  cup / 7.7 g  Beans, pinto, canned,  cup / 5.5 g Breads and Crackers / Dietary Fiber (g)  Graham crackers, plain or honey, 2 squares / 0.7 g  Saltine crackers, 3 squares / 0.3 g  Pretzels, plain, salted, 10 pieces / 1.8 g  Bread, whole-wheat, 1 slice / 1.9 g  Bread, white, 1 slice / 0.7 g  Bread, raisin, 1 slice / 1.2 g  Bagel, plain, 3 oz / 2 g  Tortilla, flour, 1 oz / 0.9 g  Tortilla, corn, 1 small / 1.5 g  Bun, hamburger or hotdog, 1 small / 0.9 g Fruits / Dietary Fiber (g)  Apple, raw with skin, 1 medium / 4.4 g  Applesauce, sweetened,  cup / 1.5 g  Banana,  medium / 1.5 g  Grapes, 10 grapes / 0.4 g  Orange, 1 small / 2.3 g  Raisin, 1.5 oz / 1.6 g  Melon, 1 cup / 1.4 g Vegetables / Dietary Fiber (g)  Green beans, canned,  cup / 1.3 g  Carrots (cooked),  cup / 2.3 g  Broccoli (cooked),  cup / 2.8 g  Peas, frozen (cooked),  cup / 4.4 g  Potatoes, mashed,  cup / 1.6 g  Lettuce, 1  cup / 0.5 g  Corn, canned,  cup / 1.6 g  Tomato,  cup / 1.1 g Document Released: 09/02/2006 Document Revised: 07/09/2011 Document Reviewed: 10/28/2006 [employer] Patient Information 2014 [place], [place]. High-Fiber Diet Fiber is found in fruits, vegetables, and grains. A high-fiber diet encourages the addition of more whole grains, legumes, fruits, and vegetables in your diet. The recommended amount of fiber for adult males is 38 g per day. For adult females, it is 25 g per day. Pregnant and lactating women should get 28 g of fiber per day. If you have a digestive or bowel problem, ask your caregiver for advice before adding high-fiber foods to your diet. Eat a variety of high-fiber foods instead of only a select few type of foods.  PURPOSE  To increase stool bulk.  To make bowel movements more regular to prevent constipation.  To lower cholesterol.  To prevent overeating. WHEN IS THIS DIET USED?  It may be used if you have constipation and hemorrhoids.  It may be used if you have uncomplicated diverticulosis (intestine condition) and irritable bowel  syndrome.  It may be used if you need help with weight management.  It may be used if you want to add it to your diet as a protective measure against atherosclerosis, diabetes, and cancer. SOURCES OF FIBER  Whole-grain breads and cereals.  Fruits, such as apples, oranges, bananas, berries, prunes, and pears.  Vegetables, such as green peas, carrots, sweet potatoes, beets, broccoli, cabbage, spinach, and artichokes.  Legumes, such split peas, soy, lentils.  Almonds. FIBER CONTENT IN FOODS Starches and Grains / Dietary Fiber (g)  Cheerios, 1 cup / 3 g  Corn Flakes cereal, 1 cup / 0.7 g  Rice crispy treat cereal, 1 cup / 0.3 g  Instant oatmeal (cooked),  cup / 2 g  Frosted wheat cereal, 1 cup / 5.1 g  Brown, long-grain rice (cooked), 1 cup / 3.5 g  White, long-grain rice (cooked), 1 cup / 0.6 g  Enriched macaroni  (cooked), 1 cup / 2.5 g Legumes / Dietary Fiber (g)  Baked beans (canned, plain, or vegetarian),  cup / 5.2 g  Kidney beans (canned),  cup / 6.8 g  Pinto beans (cooked),  cup / 5.5 g Breads and Crackers / Dietary Fiber (g)  Plain or honey graham crackers, 2 squares / 0.7 g  Saltine crackers, 3 squares / 0.3 g  Plain, salted pretzels, 10 pieces / 1.8 g  Whole-wheat bread, 1 slice / 1.9 g  White bread, 1 slice / 0.7 g  Raisin bread, 1 slice / 1.2 g  Plain bagel, 3 oz / 2 g  Flour tortilla, 1 oz / 0.9 g  Corn tortilla, 1 small / 1.5 g  Hamburger or hotdog bun, 1 small / 0.9 g Fruits / Dietary Fiber (g)  Apple with skin, 1 medium / 4.4 g  Sweetened applesauce,  cup / 1.5 g  Banana,  medium / 1.5 g  Grapes, 10 grapes / 0.4 g  Orange, 1 small / 2.3 g  Raisin, 1.5 oz / 1.6 g  Melon, 1 cup / 1.4 g Vegetables / Dietary Fiber (g)  Green beans (canned),  cup / 1.3 g  Carrots (cooked),  cup / 2.3 g  Broccoli (cooked),  cup / 2.8 g  Peas (cooked),  cup / 4.4 g  Mashed potatoes,  cup / 1.6 g  Lettuce, 1 cup / 0.5 g  Corn (canned),  cup / 1.6 g  Tomato,  cup / 1.1 g Document Released: 04/16/2005 Document Revised: 10/16/2011 Document Reviewed: 07/19/2011 [facility] Patient Information 2014 [place], [place].

## 2013-04-09 NOTE — Progress Notes (Signed)
Subjective:    Patient ID: Erica Young, female    DOB: 11-Jun-1940, 72 y.o.   MRN: [id number]  HPI 72 year old patient who is seen today for followup. She presents with a three-day history of crampy abdominal pain. She has had increasing situational stress due to to the arrival of a grandson who now lives in their home. She has had increasing stress and worsening crampy abdominal pain for the past 3 days. She has had a full GI evaluation. No fever or constitutional complaints  Past Medical History  Diagnosis Date  . HYPOTHYROIDISM 03/03/2010  . HYPERLIPIDEMIA 03/03/2010  . CEREBROVASCULAR DISEASE 03/03/2010  . DIVERTICULOSIS, COLON 03/03/2010  . Low back pain   . Right leg pain   . History of cerebral artery stenosis     right middle  . Cancer     right breat ca  , lumpectomy and radiation tx (declined chemo and additional prophylactic meds)  . Mastoiditis     noted on brain MRI  . Foramen ovale     positive bubble study  . Diverticulitis   . Fatty liver 08/02/09    as per U/S done by [facility]  . Internal hemorrhoid   . Stroke   . Anxiety   . Hypertension   . Ulcer     History   Social History  . Marital Status: Married    Spouse Name: N/A    Number of Children: 2  . Years of Education: N/A   Occupational History  . Social Worker--Retired    Social History Main Topics  . Smoking status: Former Smoker    Quit date: 05/01/1991  . Smokeless tobacco: Never Used  . Alcohol Use: No  . Drug Use: No  . Sexual Activity: Yes   Other Topics Concern  . Not on file   Social History Narrative   Daily caffeine     Past Surgical History  Procedure Laterality Date  . Cholecystectomy    . Breast lumpectomy      right  . Tonsillectomy      Family History  Problem Relation Age of Onset  . Heart disease Sister     cerebral vascular disease also  . Heart disease Brother     cerebral vascular disease also  . Heart disease Brother     cerebral vascular  disease  . Heart disease Sister     cebreal vascular disease also  . Heart disease Sister     cebreal vascular diease also  . Heart disease Sister     cerebral vascular diease also  . Colon cancer Neg Hx   . Esophageal cancer Neg Hx   . Rectal cancer Neg Hx   . Stomach cancer Neg Hx     Allergies  Allergen Reactions  . Ciprofloxacin Anaphylaxis    Current Outpatient Prescriptions on File Prior to Visit  Medication Sig Dispense Refill  . aspirin 81 MG tablet Take 81 mg by mouth daily.      . Flaxseed, Linseed, (FLAX SEEDS PO) Take by mouth once. Daily before supper      . losartan (COZAAR) 100 MG tablet Take 1 tablet (100 mg total) by mouth daily.  90 tablet  3  . Probiotic Product (PROBIOTIC DAILY PO) Take 1 tablet by mouth daily.      . simvastatin (ZOCOR) 10 MG tablet Take 1 tablet (10 mg total) by mouth at bedtime.  90 tablet  3   No current facility-administered medications on file prior  to visit.    BP 110/74  Pulse 77  Temp(Src) 97.6 F (36.4 C) (Oral)  Resp 18  Wt 139 lb (63.05 kg)  SpO2 97%       Review of Systems  Constitutional: Negative.   HENT: Negative for congestion, dental problem, hearing loss, rhinorrhea, sinus pressure, sore throat and tinnitus.   Eyes: Negative for pain, discharge and visual disturbance.  Respiratory: Negative for cough and shortness of breath.   Cardiovascular: Negative for chest pain, palpitations and leg swelling.  Gastrointestinal: Positive for abdominal pain. Negative for nausea, vomiting, diarrhea, constipation, blood in stool and abdominal distention.  Genitourinary: Negative for dysuria, urgency, frequency, hematuria, flank pain, vaginal bleeding, vaginal discharge, difficulty urinating, vaginal pain and pelvic pain.  Musculoskeletal: Negative for arthralgias, gait problem and joint swelling.  Skin: Negative for rash.  Neurological: Negative for dizziness, syncope, speech difficulty, weakness, numbness and headaches.    Hematological: Negative for adenopathy.  Psychiatric/Behavioral: Positive for sleep disturbance. Negative for behavioral problems, dysphoric mood and agitation. The patient is nervous/anxious.        Objective:   Physical Exam  Constitutional: She is oriented to person, place, and time. She appears well-developed and well-nourished.  HENT:  Head: Normocephalic.  Right Ear: External ear normal.  Left Ear: External ear normal.  Mouth/Throat: Oropharynx is clear and moist.  Eyes: Conjunctivae and EOM are normal. Pupils are equal, round, and reactive to light.  Neck: Normal range of motion. Neck supple. No thyromegaly present.  Cardiovascular: Normal rate, regular rhythm, normal heart sounds and intact distal pulses.   Pulmonary/Chest: Effort normal and breath sounds normal.  Abdominal: Soft. Bowel sounds are normal. She exhibits no mass. There is no tenderness.  Musculoskeletal: Normal range of motion.  Lymphadenopathy:    She has no cervical adenopathy.  Neurological: She is alert and oriented to person, place, and time.  Skin: Skin is warm and dry. No rash noted.  Psychiatric: She has a normal mood and affect. Her behavior is normal.          Assessment & Plan:   Anxiety disorder IBS  Recent exacerbation due to situational stress. Stress management discussed. Xanax refill.  Hypertension stable. Recheck 6 months

## 2013-04-09 NOTE — Progress Notes (Signed)
Pre-visit discussion using our clinic review tool. No additional management support is needed unless otherwise documented below in the visit note.  

## 2013-04-13 ENCOUNTER — Encounter (HOSPITAL_COMMUNITY): Payer: Self-pay | Admitting: Emergency Medicine

## 2013-04-13 ENCOUNTER — Emergency Department (INDEPENDENT_AMBULATORY_CARE_PROVIDER_SITE_OTHER)
Admission: EM | Admit: 2013-04-13 | Discharge: 2013-04-13 | Disposition: A | Discharge status: Nursing Home (Includes ICF) | Payer: Medicare PPO | Source: Home / Self Care | Attending: Emergency Medicine | Admitting: Emergency Medicine

## 2013-04-13 ENCOUNTER — Emergency Department (HOSPITAL_COMMUNITY): Payer: Medicare PPO

## 2013-04-13 ENCOUNTER — Telehealth: Payer: Self-pay | Admitting: *Deleted

## 2013-04-13 ENCOUNTER — Emergency Department (HOSPITAL_COMMUNITY)
Admission: EM | Admit: 2013-04-13 | Discharge: 2013-04-13 | Disposition: A | Discharge status: Home / Self Care | Payer: Medicare PPO | Attending: Emergency Medicine | Admitting: Emergency Medicine

## 2013-04-13 DIAGNOSIS — Z923 Personal history of irradiation: Secondary | ICD-10-CM | POA: Insufficient documentation

## 2013-04-13 DIAGNOSIS — S20219A Contusion of unspecified front wall of thorax, initial encounter: Secondary | ICD-10-CM

## 2013-04-13 DIAGNOSIS — Z853 Personal history of malignant neoplasm of breast: Secondary | ICD-10-CM | POA: Insufficient documentation

## 2013-04-13 DIAGNOSIS — Y939 Activity, unspecified: Secondary | ICD-10-CM | POA: Insufficient documentation

## 2013-04-13 DIAGNOSIS — Q211 Atrial septal defect: Secondary | ICD-10-CM | POA: Insufficient documentation

## 2013-04-13 DIAGNOSIS — F411 Generalized anxiety disorder: Secondary | ICD-10-CM | POA: Insufficient documentation

## 2013-04-13 DIAGNOSIS — S3981XA Other specified injuries of abdomen, initial encounter: Secondary | ICD-10-CM | POA: Insufficient documentation

## 2013-04-13 DIAGNOSIS — W010XXA Fall on same level from slipping, tripping and stumbling without subsequent striking against object, initial encounter: Secondary | ICD-10-CM | POA: Insufficient documentation

## 2013-04-13 DIAGNOSIS — Z7982 Long term (current) use of aspirin: Secondary | ICD-10-CM | POA: Insufficient documentation

## 2013-04-13 DIAGNOSIS — Z79899 Other long term (current) drug therapy: Secondary | ICD-10-CM | POA: Insufficient documentation

## 2013-04-13 DIAGNOSIS — R0602 Shortness of breath: Secondary | ICD-10-CM | POA: Insufficient documentation

## 2013-04-13 DIAGNOSIS — Q2111 Secundum atrial septal defect: Secondary | ICD-10-CM | POA: Insufficient documentation

## 2013-04-13 DIAGNOSIS — Z8719 Personal history of other diseases of the digestive system: Secondary | ICD-10-CM | POA: Insufficient documentation

## 2013-04-13 DIAGNOSIS — I10 Essential (primary) hypertension: Secondary | ICD-10-CM | POA: Insufficient documentation

## 2013-04-13 DIAGNOSIS — S20212A Contusion of left front wall of thorax, initial encounter: Secondary | ICD-10-CM

## 2013-04-13 DIAGNOSIS — Z8673 Personal history of transient ischemic attack (TIA), and cerebral infarction without residual deficits: Secondary | ICD-10-CM | POA: Insufficient documentation

## 2013-04-13 DIAGNOSIS — E785 Hyperlipidemia, unspecified: Secondary | ICD-10-CM | POA: Insufficient documentation

## 2013-04-13 DIAGNOSIS — S301XXA Contusion of abdominal wall, initial encounter: Secondary | ICD-10-CM

## 2013-04-13 DIAGNOSIS — Y929 Unspecified place or not applicable: Secondary | ICD-10-CM | POA: Insufficient documentation

## 2013-04-13 DIAGNOSIS — Z8669 Personal history of other diseases of the nervous system and sense organs: Secondary | ICD-10-CM | POA: Insufficient documentation

## 2013-04-13 DIAGNOSIS — Z87891 Personal history of nicotine dependence: Secondary | ICD-10-CM | POA: Insufficient documentation

## 2013-04-13 LAB — POCT I-STAT, CHEM 8
Calcium, Ion: 1.17 mmol/L (ref 1.13–1.30)
Chloride: 102 mEq/L (ref 96–112)
Glucose, Bld: 152 mg/dL — ABNORMAL HIGH (ref 70–99)
HCT: 40 % (ref 36.0–46.0)
Hemoglobin: 13.6 g/dL (ref 12.0–15.0)
TCO2: 25 mmol/L (ref 0–100)

## 2013-04-13 MED ORDER — IOHEXOL 300 MG/ML  SOLN
100.0000 mL | Freq: Once | INTRAMUSCULAR | Status: AC | PRN
  Administered 2013-04-13: 100 mL via INTRAVENOUS

## 2013-04-13 MED ORDER — HYDROMORPHONE HCL PF 1 MG/ML IJ SOLN
1.0000 mg | Freq: Once | INTRAMUSCULAR | Status: AC
  Administered 2013-04-13: 1 mg via INTRAVENOUS
  Filled 2013-04-13: qty 1

## 2013-04-13 MED ORDER — SODIUM CHLORIDE 0.9 % IV SOLN
INTRAVENOUS | Status: DC
  Administered 2013-04-13: 17:00:00 via INTRAVENOUS

## 2013-04-13 MED ORDER — ONDANSETRON 4 MG PO TBDP
8.0000 mg | ORAL_TABLET | Freq: Once | ORAL | Status: AC
  Administered 2013-04-13: 8 mg via ORAL

## 2013-04-13 MED ORDER — HYDROCODONE-ACETAMINOPHEN 5-325 MG PO TABS: 2.0000 | ORAL_TABLET | ORAL | Status: DC | PRN

## 2013-04-13 MED ORDER — HYDROMORPHONE HCL PF 1 MG/ML IJ SOLN
1.0000 mg | Freq: Once | INTRAMUSCULAR | Status: AC
  Administered 2013-04-13: 1 mg via INTRAMUSCULAR

## 2013-04-13 MED ORDER — ONDANSETRON 4 MG PO TBDP
ORAL_TABLET | ORAL | Status: AC
  Filled 2013-04-13: qty 2

## 2013-04-13 MED ORDER — HYDROMORPHONE HCL PF 1 MG/ML IJ SOLN
INTRAMUSCULAR | Status: AC
  Filled 2013-04-13: qty 1

## 2013-04-13 MED ORDER — ONDANSETRON HCL 4 MG/2ML IJ SOLN
4.0000 mg | Freq: Once | INTRAMUSCULAR | Status: AC
  Administered 2013-04-13: 4 mg via INTRAVENOUS
  Filled 2013-04-13: qty 2

## 2013-04-13 MED ORDER — PROMETHAZINE HCL 25 MG/ML IJ SOLN
12.5000 mg | Freq: Once | INTRAMUSCULAR | Status: AC
  Administered 2013-04-13: 12.5 mg via INTRAVENOUS
  Filled 2013-04-13: qty 1

## 2013-04-13 NOTE — Telephone Encounter (Signed)
Pt walked into office with husband, had just fallen outside [employer] and refused to go in ambulance because she was by herself. Pt fell and c/o pain on left side at rib area especially when takes deep breath. Told pt possible needs x-ray to see if rib fracture and no available appointments here today and Dr. Kirtland Bouchard is out. Advised pt and husband to go to Urgent care at [facility]. Pt and husband verbalized understanding and will go there.

## 2013-04-13 NOTE — ED Provider Notes (Signed)
Chief Complaint:   Chief Complaint  Patient presents with  . Fall    History of Present Illness:   Erica Young is a 72 year old female who sustained a fall today around 12 noon at a [employer] store. She tripped over an uneven sidewalk and fell face forward on the cement. She was able to catch her fall with her outstretched hands. Right now her main pain is in her left lower rib cage area and her left upper quadrant of her abdomen. It hurts to move, twist, to bend, to take a deep breath, and with palpation the area. She has a little bit of pain in the palms of her hands, but not severe and she is able to move her wrist well. She did not hit her head and there was no loss of consciousness. She denies any headache or neck pain. No pain in her shoulders her elbows. She denies any pain over the sternum, cough, hemoptysis, or shortness of breath. She has not had any nausea, vomiting, hematemesis, or blood in the stool. She denies any blood in her urine. She has no pain in the pelvic or lower back area, or lower extremities. She denies any neurological symptoms.  Review of Systems:  Other than noted above, the patient denies any of the following symptoms: Systemic:  No fevers or chills. Eye:  No diplopia or blurred vision. ENT:  No headache, facial pain, or bleeding from the nose or ears.  No loose or broken teeth. Neck:  No neck pain or stiffnes. Resp:  No shortness of breath. Cardiac:  No chest pain. No palpitations, dizziness, syncope or fainting. GI:  No abdominal pain. No nausea, vomiting, or diarrhea. GU:  No blood in urine. M-S:  No extremity pain, swelling, bruising, limited ROM, neck or back pain. Neuro:  No headache, loss of consciousness, seizure activity, dizziness, vertigo, paresthesias, numbness, or weakness.  No difficulty with speech or ambulation.   PMFSH:  Past medical history, family history, social history, meds, and allergies were reviewed. She is allergic to Cipro. Current meds  include alprazolam, aspirin, Cozaar, and Zocor. She has a history of diverticulitis, anxiety, hypertension, and hypercholesterolemia.  Physical Exam:   Vital signs:  BP 132/65  Pulse 75  Temp(Src) 97.8 F (36.6 C) (Oral)  Resp 16  SpO2 98% General:  Alert, oriented and in severe distress with any movement that disturbs her chest. Eye:  PERRL, full EOMs. ENT:  No cranial or facial tenderness to palpation. Neck:  No tenderness to palpation.  Full ROM without pain. Heart:  Regular rhythm.  No extrasystoles, gallops, or murmers. Lungs:  There is tenderness to palpation in the left, lower, anterolateral chest wall area without swelling, bruising, or deformity. Breath sounds clear and equal bilaterally.  No wheezes, rales or rhonchi. Abdomen:  She has severe tenderness to palpation left upper quadrant of the abdomen. No palpable mass, bowel sounds are normal. Back:  Non tender to palpation.  Full ROM without pain. Extremities:  No tenderness, swelling, bruising or deformity.  Full ROM of all joints without pain.  Pulses full.  Brisk capillary refill. Neuro:  Alert and oriented times 3.  Cranial nerves intact.  No muscle weakness.  Sensation intact to light touch.  Gait normal. Skin:  No bruising, abrasions, or lacerations.  Course in Urgent Care Center:   She was begun on IV normal saline at 50 mL per hour and given Dilaudid 1 mg IM and Zofran ODT 8 mg sublingually.  Assessment:  The  primary encounter diagnosis was Chest wall contusion, left, initial encounter. A diagnosis of Abdominal contusion, initial encounter was also pertinent to this visit.  I think she may have a couple of fractured ribs. While most concerned about however is her left upper quadrant abdominal pain which may indicate a splenic contusion or laceration. I think she needs to be ruled out for a splenic injury as well as having x-rays of her ribs.  Plan:  The patient was transferred to the ED via [facility] in stable  condition.  Medical Decision Making:  Larey Seat today at [employer], landed face down, did not hit her  Head and no LOC.  Now has pain in left lower, anterolateral ribcage, but also in LUQ of abdomen.  Needs to be ruled out for splenic rupture as well as rib films.  We have given Dilaudid 1 mg IM for pain and Zofran ODT.    Reuben Likes, MD 04/13/13 [phone]

## 2013-04-13 NOTE — ED Notes (Signed)
Placed  On  Nasal o2  At  2  l  /  Min  /  Cardiac  Monitor

## 2013-04-13 NOTE — ED Provider Notes (Signed)
CSN: [id number]     Arrival date & time 04/13/13  1742 History   First MD Initiated Contact with Patient 04/13/13 1745     Chief Complaint  Patient presents with  . Fall   (Consider location/radiation/quality/duration/timing/severity/associated sxs/prior Treatment) HPI Comments: Patient is a 72 year old female who presents today after a fall. The fall occurred around noon outside of [employer]. She tripped over uneven sidewalk and fell over onto the cement. There was no loss of consciousness, she did not hit her head. There was no disorientation. She fell on an outstretched hand as well as her left side. She currently is having severe, sharp pain in her left upper quadrant of her abdomen. She reports it is mildly difficult to breathe. She was initially seen at urgent care and given 1 mg of Dilaudid. She reports that initially this improved her pain, but the ride over from urgent care by her pain unbearable. She denies any headache, visual disturbance, neck pain, chest pain.   Patient is a 72 y.o. female presenting with fall. The history is provided by the patient. No language interpreter was used.  Fall Associated symptoms include abdominal pain and arthralgias. Pertinent negatives include no chest pain, chills, fever or weakness.    Past Medical History  Diagnosis Date  . HYPOTHYROIDISM 03/03/2010  . HYPERLIPIDEMIA 03/03/2010  . CEREBROVASCULAR DISEASE 03/03/2010  . DIVERTICULOSIS, COLON 03/03/2010  . Low back pain   . Right leg pain   . History of cerebral artery stenosis     right middle  . Cancer     right breat ca  , lumpectomy and radiation tx (declined chemo and additional prophylactic meds)  . Mastoiditis     noted on brain MRI  . Foramen ovale     positive bubble study  . Diverticulitis   . Fatty liver 08/02/09    as per U/S done by [facility]  . Internal hemorrhoid   . Stroke   . Anxiety   . Hypertension   . Ulcer    Past Surgical History  Procedure Laterality  Date  . Cholecystectomy    . Breast lumpectomy      right  . Tonsillectomy     Family History  Problem Relation Age of Onset  . Heart disease Sister     cerebral vascular disease also  . Heart disease Brother     cerebral vascular disease also  . Heart disease Brother     cerebral vascular disease  . Heart disease Sister     cebreal vascular disease also  . Heart disease Sister     cebreal vascular diease also  . Heart disease Sister     cerebral vascular diease also  . Colon cancer Neg Hx   . Esophageal cancer Neg Hx   . Rectal cancer Neg Hx   . Stomach cancer Neg Hx    History  Substance Use Topics  . Smoking status: Former Smoker    Quit date: 05/01/1991  . Smokeless tobacco: Never Used  . Alcohol Use: No   OB History   Grav Para Term Preterm Abortions TAB SAB Ect Mult Living   4 2             Review of Systems  Constitutional: Negative for fever and chills.  Respiratory: Positive for shortness of breath.   Cardiovascular: Negative for chest pain.  Gastrointestinal: Positive for abdominal pain.  Musculoskeletal: Positive for arthralgias.  Neurological: Negative for weakness.  All other systems reviewed and  are negative.    Allergies  Ciprofloxacin  Home Medications   Current Outpatient Rx  Name  Route  Sig  Dispense  Refill  . ALPRAZolam (XANAX) 0.25 MG tablet   Oral   Take 0.25 mg by mouth 3 (three) times daily as needed for anxiety.         Marland Kitchen aspirin 81 MG tablet   Oral   Take 81 mg by mouth daily.         . Flaxseed, Linseed, (FLAX SEEDS PO)   Oral   Take 1 tablet by mouth every evening. Daily before supper         . losartan (COZAAR) 100 MG tablet   Oral   Take 100 mg by mouth daily.         . Probiotic Product (PROBIOTIC DAILY PO)   Oral   Take 1 tablet by mouth daily.         . simvastatin (ZOCOR) 10 MG tablet   Oral   Take 10 mg by mouth daily.         Marland Kitchen HYDROcodone-acetaminophen (NORCO/VICODIN) 5-325 MG per tablet    Oral   Take 2 tablets by mouth every 4 (four) hours as needed.   30 tablet   0    BP 125/67  Pulse 67  Temp(Src) 97.7 F (36.5 C) (Oral)  Resp 12  SpO2 100% Physical Exam  Nursing note and vitals reviewed. Constitutional: She is oriented to person, place, and time. She appears well-developed and well-nourished. No distress.  HENT:  Head: Normocephalic and atraumatic.  Right Ear: External ear normal.  Left Ear: External ear normal.  Nose: Nose normal.  Mouth/Throat: Oropharynx is clear and moist.  Eyes: Conjunctivae are normal.  Neck: Normal range of motion.  Cardiovascular: Normal rate, regular rhythm, normal heart sounds, intact distal pulses and normal pulses.   Pulses:      Dorsalis pedis pulses are 2+ on the right side, and 2+ on the left side.       Posterior tibial pulses are 2+ on the right side, and 2+ on the left side.  Pulmonary/Chest: Effort normal and breath sounds normal. No stridor. No respiratory distress. She has no wheezes. She has no rales.  Abdominal: Soft. She exhibits no distension. There is tenderness in the left upper quadrant. There is no rigidity, no rebound and no guarding.    Severe tenderness to left upper quadrant  Musculoskeletal: Normal range of motion.  Moves all extremities without guarding  Neurological: She is alert and oriented to person, place, and time. She has normal strength. No cranial nerve deficit or sensory deficit. Coordination normal.  Skin: Skin is warm and dry. She is not diaphoretic. No erythema.  Psychiatric: She has a normal mood and affect. Her behavior is normal.    ED Course  Procedures (including critical care time) Labs Review Labs Reviewed  POCT I-STAT, CHEM 8 - Abnormal; Notable for the following:    Glucose, Bld 152 (*)    All other components within normal limits   Imaging Review Dg Ribs Unilateral W/chest Left  04/13/2013   CLINICAL DATA:  History of fall complaining of pain in the left ribs and back pain.   EXAM: LEFT RIBS AND CHEST - 3+ VIEW  COMPARISON:  No priors.  FINDINGS: Lungs appear well expanded, without consolidative airspace disease. No definite pleural effusions. Diffuse peribronchial cuffing. Cephalization of the pulmonary vasculature, without frank pulmonary edema. No pneumothorax. Heart size is normal. Upper mediastinal  contours are within normal limits. Atherosclerosis in the thoracic aorta. Surgical clips in the lateral aspect of the upper right breast. Surgical clips project over the right upper quadrant of the abdomen, likely from prior cholecystectomy.  Dedicated views of the left ribs demonstrate no definite acute displaced left-sided rib fractures.  IMPRESSION: 1. No acute displaced left-sided rib fractures. 2. Mild diffuse peribronchial cuffing. This could be acute or chronic, and may indicate reactive airway disease, acute bronchitis or chronic bronchitis. 3. Atherosclerosis. 4. Postoperative changes, as above.   Electronically Signed   By: Trudie Reed M.D.   On: 04/13/2013 19:28   Ct Abdomen Pelvis W Contrast  04/13/2013   CLINICAL DATA:  Fall, left side pain and left rib pain, question splenic injury ; history right breast cancer post lumpectomy and radiation therapy  EXAM: CT ABDOMEN AND PELVIS WITH CONTRAST  TECHNIQUE: Multidetector CT imaging of the abdomen and pelvis was performed using the standard protocol following bolus administration of intravenous contrast. Sagittal and coronal MPR images reconstructed from axial data set.  CONTRAST:  OMNIPAQUE IOHEXOL 300 MG/ML SOLN No oral contrast administered.  COMPARISON:  None  FINDINGS: Bibasilar atelectasis dependently.  Question focus of nodularity at lateral left lung base, 2 adjacent versus single bilobed nodule, 6 x 5 mm in aggregate image 5.  Enlarged low-attenuation lymph node at subcarinal region, 3.6 x 2.4 cm image 1.  Additional right hilar adenopathy, 2.8 x 1.3 cm image 1.  Gallbladder surgically absent.  Small right  renal cysts.  Liver, spleen, pancreas, kidneys, and adrenal glands otherwise normal appearance.  Normal appendix.  Unremarkable bladder, uterus, and adnexae.  Descending and sigmoid colonic diverticulosis.  Stomach and bowel loops otherwise normal appearance.  No mass, adenopathy, free fluid or inflammatory process.  Bones appear demineralized without fracture.  No acute osseous findings.  IMPRESSION: No acute intra-abdominal or intrapelvic abnormalities.  Mild distal colonic diverticulosis.  Low-attenuation adenopathy in subcarinal region and at right hilum, could be related to metastatic disease in patient with history of breast cancer or infection/reactive process, though other etiologies are not excluded.  Correlation with any prior outside imaging patient may have recommended.  In the absence of previous studies, followup CT imaging of the chest with contrast recommended for further assessment.   Electronically Signed   By: Ulyses Southward M.D.   On: 04/13/2013 22:01    EKG Interpretation   None       MDM   1. Chest wall contusion, left, initial encounter     Patient presents to ED after a fall. Pain is currently controlled with Dilaudid. Pt became more nauseated and vomiting. I gave pt additional phenergan. Severe tenderness to palpation over left upper quadrant. No rib fx seen on XR. CT scan pending to rule out splenic injury. Vital signs are stable at this time. Patient signed out to Schinlever, PA-C at change of shift. If CT is negative and pain is reasonably controlled patient maybe discharged with symptomatic medications. If CT is abnormal f/u accordingly.     Mora Bellman, PA-C 04/13/13 2033  Roney Marion, MD 04/13/13 2224

## 2013-04-13 NOTE — ED Provider Notes (Signed)
Patient seen and evaluated. She is resting. She's not hypoxemic. She is tender along the left lower ribs. CT scan results were reviewed by myself. These were reviewed with the family. She does have some enlarged lymph nodes in her chest. Please see dictated radiology report. She has history of breast cancer status post lumpectomy and and treatment.  She has no signs of fractured ribs on with x-rays. She has no signs of solid organ injury on CT scan or free fluid. I discussed the CT findings with the patient, her husband, and son. He is recommended she get followup CT to evaluate these for changes. I told in no uncertain terms that she will need to followup with her primary care physician arrange to have this done. This was performed in the emergency room tonight because she has had IV contrast already once tonight. She is appropriate for outpatient treatment with pain control pulmonary toilet. Have verbally discussed the need for followup and this was given to them in written form at discharge.  Roney Marion, MD 04/13/13 2221

## 2013-04-13 NOTE — ED Notes (Signed)
EMS reported PT fall at 1200 today. Refused EMS transport at that time, but began to have L chest wall pain and went to urgent care. Urgent care admin 1mg  dilaudid and transferred patient to ED for further evaluation.

## 2013-04-13 NOTE — ED Notes (Signed)
Husband stated patient hard stick attempted once by EMT will call phlebotomy to draw.

## 2013-04-13 NOTE — ED Notes (Signed)
Pt taken to CT.

## 2013-04-13 NOTE — ED Notes (Signed)
Pt       Reports  l    Rib  Cage  Pain        Larey Seat            Today        Did  Not  Black     Out             Pt  repotrts  Pain  When she  Takes  A  Deep  Breath

## 2013-04-16 ENCOUNTER — Telehealth: Payer: Self-pay | Admitting: Internal Medicine

## 2013-04-16 MED ORDER — HYDROCODONE-ACETAMINOPHEN 5-325 MG PO TABS: 1.0000 | ORAL_TABLET | Freq: Four times a day (QID) | ORAL | Status: DC | PRN

## 2013-04-16 NOTE — Telephone Encounter (Signed)
Discussed pt with Dr. Caryl Never, verbal order given for Vicodin 5-325 mg one tablet every 6 hours prn, #30 pt needs to scale back on pain med.

## 2013-04-16 NOTE — Telephone Encounter (Signed)
Tried to contact pt no answer, no voicemail either, will try again later. Rx printed and signed, put at front desk.

## 2013-04-16 NOTE — Telephone Encounter (Signed)
Pt's husband calling on behalf of pt to report pt was discharged from the hospital on 04/13/13 due to a severe fall.  Pt was given a script for pain medication (not sure which one) but she will run out of medication on Sunday.  They are requesting a script to be called in by PCP, pt is scheduled for a post hospital follow up on 12/29 as they are unable to come in the week of Christmas.

## 2013-04-16 NOTE — Telephone Encounter (Signed)
Spoke to pt's husband told him Rx ready for pickup will be at the front desk. Told him pt needs to cut back on Vicodin only 1 tablet every 6 hours as needed per Dr. Caryl Never. Mr. Kaus verbalized understanding and stated she has due to constipation. Told him pt can take OTC stool softners and miralax to help with constipation. Mr. Criado said that is what they pickup. Told him okay.

## 2013-04-27 ENCOUNTER — Ambulatory Visit (INDEPENDENT_AMBULATORY_CARE_PROVIDER_SITE_OTHER): Payer: 59 | Admitting: Internal Medicine

## 2013-04-27 ENCOUNTER — Encounter: Payer: Self-pay | Admitting: Internal Medicine

## 2013-04-27 VITALS — BP 120/80 | Temp 98.6°F | Wt 140.0 lb

## 2013-04-27 DIAGNOSIS — R599 Enlarged lymph nodes, unspecified: Secondary | ICD-10-CM

## 2013-04-27 DIAGNOSIS — R590 Localized enlarged lymph nodes: Secondary | ICD-10-CM

## 2013-04-27 DIAGNOSIS — I10 Essential (primary) hypertension: Secondary | ICD-10-CM

## 2013-04-27 NOTE — Patient Instructions (Signed)
Chest CT scan as discussed  Call or return to clinic prn if these symptoms worsen or fail to improve as anticipated.

## 2013-04-27 NOTE — Progress Notes (Signed)
Pre visit review using our clinic review tool, if applicable. No additional management support is needed unless otherwise documented below in the visit note. 

## 2013-04-27 NOTE — Progress Notes (Signed)
Subjective:    Patient ID: Erica Young, female    DOB: 08-Mar-1941, 72 y.o.   MRN: [id number]  HPI  72 year old patient who was evaluated in the ED on December 15 after a fall. This resulted in left chest wall and abdominal trauma. She has significant chest wall and left upper quadrant pain. Evaluation included a abdominal CT to rule out a ruptured spleen. This revealed a suspicion for right hilar and subcarinal adenopathy. Patient has a remote history of stage I breast cancer status post lumpectomy followed by RT. Patient's pain has largely resolved.  She has treated hypertension.  Past Medical History  Diagnosis Date  . HYPOTHYROIDISM 03/03/2010  . HYPERLIPIDEMIA 03/03/2010  . CEREBROVASCULAR DISEASE 03/03/2010  . DIVERTICULOSIS, COLON 03/03/2010  . Low back pain   . Right leg pain   . History of cerebral artery stenosis     right middle  . Cancer     right breat ca  , lumpectomy and radiation tx (declined chemo and additional prophylactic meds)  . Mastoiditis     noted on brain MRI  . Foramen ovale     positive bubble study  . Diverticulitis   . Fatty liver 08/02/09    as per U/S done by [facility]  . Internal hemorrhoid   . Stroke   . Anxiety   . Hypertension   . Ulcer     History   Social History  . Marital Status: Married    Spouse Name: N/A    Number of Children: 2  . Years of Education: N/A   Occupational History  . Social Worker--Retired    Social History Main Topics  . Smoking status: Former Smoker    Quit date: 05/01/1991  . Smokeless tobacco: Never Used  . Alcohol Use: No  . Drug Use: No  . Sexual Activity: Yes   Other Topics Concern  . Not on file   Social History Narrative   Daily caffeine     Past Surgical History  Procedure Laterality Date  . Cholecystectomy    . Breast lumpectomy      right  . Tonsillectomy      Family History  Problem Relation Age of Onset  . Heart disease Sister     cerebral vascular disease also  .  Heart disease Brother     cerebral vascular disease also  . Heart disease Brother     cerebral vascular disease  . Heart disease Sister     cebreal vascular disease also  . Heart disease Sister     cebreal vascular diease also  . Heart disease Sister     cerebral vascular diease also  . Colon cancer Neg Hx   . Esophageal cancer Neg Hx   . Rectal cancer Neg Hx   . Stomach cancer Neg Hx     Allergies  Allergen Reactions  . Ciprofloxacin Anaphylaxis    Current Outpatient Prescriptions on File Prior to Visit  Medication Sig Dispense Refill  . ALPRAZolam (XANAX) 0.25 MG tablet Take 0.25 mg by mouth 3 (three) times daily as needed for anxiety.      Marland Kitchen aspirin 81 MG tablet Take 81 mg by mouth daily.      . Flaxseed, Linseed, (FLAX SEEDS PO) Take 1 tablet by mouth every evening. Daily before supper      . losartan (COZAAR) 100 MG tablet Take 100 mg by mouth daily.      . Probiotic Product (PROBIOTIC DAILY PO) Take 1  tablet by mouth daily.      . simvastatin (ZOCOR) 10 MG tablet Take 10 mg by mouth daily.      Marland Kitchen HYDROcodone-acetaminophen (NORCO/VICODIN) 5-325 MG per tablet Take 1 tablet by mouth every 6 (six) hours as needed.  30 tablet  0   No current facility-administered medications on file prior to visit.    BP 120/80  Temp(Src) 98.6 F (37 C) (Oral)  Wt 140 lb (63.504 kg)       Review of Systems  Constitutional: Negative.   HENT: Negative for congestion, dental problem, hearing loss, rhinorrhea, sinus pressure, sore throat and tinnitus.   Eyes: Negative for pain, discharge and visual disturbance.  Respiratory: Negative for cough and shortness of breath.   Cardiovascular: Positive for chest pain. Negative for palpitations and leg swelling.  Gastrointestinal: Negative for nausea, vomiting, abdominal pain, diarrhea, constipation, blood in stool and abdominal distention.  Genitourinary: Negative for dysuria, urgency, frequency, hematuria, flank pain, vaginal bleeding,  vaginal discharge, difficulty urinating, vaginal pain and pelvic pain.  Musculoskeletal: Negative for arthralgias, gait problem and joint swelling.  Skin: Negative for rash.  Neurological: Negative for dizziness, syncope, speech difficulty, weakness, numbness and headaches.  Hematological: Negative for adenopathy.  Psychiatric/Behavioral: Negative for behavioral problems, dysphoric mood and agitation. The patient is not nervous/anxious.        Objective:   Physical Exam  Constitutional: She is oriented to person, place, and time. She appears well-developed and well-nourished.  HENT:  Head: Normocephalic.  Right Ear: External ear normal.  Left Ear: External ear normal.  Mouth/Throat: Oropharynx is clear and moist.  Eyes: Conjunctivae and EOM are normal. Pupils are equal, round, and reactive to light.  Neck: Normal range of motion. Neck supple. No thyromegaly present.  No adenopathy  Cardiovascular: Normal rate, regular rhythm, normal heart sounds and intact distal pulses.   Pulmonary/Chest: Effort normal and breath sounds normal.  Abdominal: Soft. Bowel sounds are normal. She exhibits no mass. There is no tenderness.  Musculoskeletal: Normal range of motion.  Lymphadenopathy:    She has no cervical adenopathy.  Neurological: She is alert and oriented to person, place, and time.  Skin: Skin is warm and dry. No rash noted.  Psychiatric: She has a normal mood and affect. Her behavior is normal.          Assessment & Plan:   Left-sided chest wall contusion  Rule out pathological right hilar and/or subcarinal lymphadenopathy. We'll proceed with a chest CT with contrast as recommended Hypertension stable

## 2013-05-01 ENCOUNTER — Encounter: Payer: Self-pay | Admitting: Physician Assistant

## 2013-05-01 ENCOUNTER — Telehealth: Payer: Self-pay | Admitting: Internal Medicine

## 2013-05-01 ENCOUNTER — Other Ambulatory Visit (INDEPENDENT_AMBULATORY_CARE_PROVIDER_SITE_OTHER): Payer: Medicare Other

## 2013-05-01 ENCOUNTER — Ambulatory Visit (INDEPENDENT_AMBULATORY_CARE_PROVIDER_SITE_OTHER): Payer: Medicare Other | Admitting: Physician Assistant

## 2013-05-01 VITALS — BP 100/62 | HR 88 | Ht 62.0 in | Wt 134.0 lb

## 2013-05-01 DIAGNOSIS — R1032 Left lower quadrant pain: Secondary | ICD-10-CM | POA: Diagnosis not present

## 2013-05-01 DIAGNOSIS — R1031 Right lower quadrant pain: Secondary | ICD-10-CM

## 2013-05-01 DIAGNOSIS — G8929 Other chronic pain: Secondary | ICD-10-CM

## 2013-05-01 DIAGNOSIS — K5900 Constipation, unspecified: Secondary | ICD-10-CM

## 2013-05-01 DIAGNOSIS — K5732 Diverticulitis of large intestine without perforation or abscess without bleeding: Secondary | ICD-10-CM

## 2013-05-01 LAB — CBC WITH DIFFERENTIAL/PLATELET
Basophils Absolute: 0 10*3/uL (ref 0.0–0.1)
Basophils Relative: 0 % (ref 0.0–3.0)
EOS ABS: 0 10*3/uL (ref 0.0–0.7)
Eosinophils Relative: 0.1 % (ref 0.0–5.0)
HCT: 38.6 % (ref 36.0–46.0)
Hemoglobin: 13.1 g/dL (ref 12.0–15.0)
Lymphocytes Relative: 7 % — ABNORMAL LOW (ref 12.0–46.0)
Lymphs Abs: 0.8 10*3/uL (ref 0.7–4.0)
MCHC: 33.8 g/dL (ref 30.0–36.0)
MCV: 95.2 fl (ref 78.0–100.0)
MONO ABS: 0.5 10*3/uL (ref 0.1–1.0)
Monocytes Relative: 4.9 % (ref 3.0–12.0)
Neutro Abs: 9.5 10*3/uL — ABNORMAL HIGH (ref 1.4–7.7)
Neutrophils Relative %: 88 % — ABNORMAL HIGH (ref 43.0–77.0)
Platelets: 234 10*3/uL (ref 150.0–400.0)
RBC: 4.06 Mil/uL (ref 3.87–5.11)
RDW: 12.9 % (ref 11.5–14.6)
WBC: 10.8 10*3/uL — AB (ref 4.5–10.5)

## 2013-05-01 MED ORDER — DICYCLOMINE HCL 10 MG PO CAPS: ORAL_CAPSULE | ORAL | Status: DC

## 2013-05-01 MED ORDER — AMOXICILLIN-POT CLAVULANATE 875-125 MG PO TABS: 1.0000 | ORAL_TABLET | Freq: Two times a day (BID) | ORAL | Status: DC

## 2013-05-01 NOTE — Progress Notes (Signed)
Subjective:    Patient ID: Erica Young, female    DOB: 01/10/1941, 73 y.o.   MRN: [id number]  HPI Erica Young is a pleasant 73 year old female known to Dr. Delfin Edis with history of diverticular disease, IBS, hypertension, hyperlipidemia, she also has history of right breast cancer for which she underwent a lumpectomy. She has history of a duodenal ulcer remotely and is status post cholecystectomy. Last colonoscopy was done October 2013 showing moderate diverticulosis throughout the colon and a somewhat narrowed sigmoid colon. She had EGD in November of 2014 due to complaints of epigastric pain and was found to have a small hiatal hernia but otherwise negative exam.  Is comes in today with complaints of lower abdominal  cramping, and pain   over the past 5 days. She says she had a bad fall in mid December and had to take pain medication for several days in a row, she became constipated and then had to take laxatives but says she got herself straightened out until this past Sunday. On Sunday evening she developed lower abdominal cramping and says that she has had cramping and pain fairly continuously since that time over the past 5 days. She is feeling some pressure in her lower abdomen as well as in her rectum. She's not had any documented fever or chills. She has had increased belching and burping no nausea or vomiting but says she does not have an appetite and is afraid eat. She has not noticed any increase in her pain postprandially. She has had a couple of bowel movements this week but says they have been very small hard and dark in color.  Patient did undergo CT of the abdomen and pelvis after she fell on December 15 and abdominal CT was negative she was noted to have on chest CT a subcarinal lymph node as well as some right hilar adenopathy. She says she scheduled for followup CT scan in a couple of weeks.    Review of Systems  Constitutional: Positive for appetite change.  HENT: Negative.    Eyes: Negative.   Respiratory: Negative.   Gastrointestinal: Positive for abdominal pain and constipation.  Endocrine: Negative.   Musculoskeletal: Positive for arthralgias and back pain.  Skin: Negative.   Allergic/Immunologic: Negative.   Neurological: Negative.   Hematological: Negative.   Psychiatric/Behavioral: Negative.    Outpatient Prescriptions Prior to Visit  Medication Sig Dispense Refill  . ALPRAZolam (XANAX) 0.25 MG tablet Take 0.25 mg by mouth 3 (three) times daily as needed for anxiety.      Marland Kitchen aspirin 81 MG tablet Take 81 mg by mouth daily.      . Flaxseed, Linseed, (FLAX SEEDS PO) Take 1 tablet by mouth every evening. Daily before supper      . HYDROcodone-acetaminophen (NORCO/VICODIN) 5-325 MG per tablet Take 1 tablet by mouth every 6 (six) hours as needed.  30 tablet  0  . losartan (COZAAR) 100 MG tablet Take 100 mg by mouth daily.      . Probiotic Product (PROBIOTIC DAILY PO) Take 1 tablet by mouth daily.      . simvastatin (ZOCOR) 10 MG tablet Take 10 mg by mouth daily.       No facility-administered medications prior to visit.   Allergies  Allergen Reactions  . Ciprofloxacin Anaphylaxis    Patient Active Problem List   Diagnosis Date Noted  . Abdominal pain 05/07/2011  . Hypertension 06/09/2010  . BACK PAIN 04/10/2010  . HYPOTHYROIDISM 03/03/2010  . HYPERLIPIDEMIA 03/03/2010  .  CEREBROVASCULAR DISEASE 03/03/2010  . DIVERTICULOSIS, COLON 03/03/2010   History  Substance Use Topics  . Smoking status: Former Smoker    Quit date: 05/01/1991  . Smokeless tobacco: Never Used  . Alcohol Use: No    Objective:   Physical Exam  well-developed older female in no acute distress, accompanied by her husband. Uncomfortable-appearing blood pressure 100/62 pulse 88 height 5 foot 2 weight 134. HEENT; nontraumatic normocephalic EOMI PERRLA sclera anicteric, Supple ;no JVD, Cardiovascular; regular rate and rhythm with S1-S2 no murmur or gallop, Pulmonary; clear  bilaterally, Abdomen; soft bowel sounds are present there is no palpable mass or hepatosplenomegaly she is tender bilaterally in the lower quadrants left greater than right no guarding or rebound, Rectal; exam no impaction she does have some formed stool in the rectal vault dark and heme negative, Ext; no clubbing cyanosis or edema skin warm dry, Psych ;mood and affect appropriate        Assessment & Plan:  #76  73 year old female with a five-day history of lower abdominal  pain cramping and altered bowel habits. Symptoms and exam most consistent with acute diverticulitis perhaps exacerbated by constipation and oral analgesic use #2 IBS #3 GERD #4 history of breast cancer status post right lumpectomy-patient with subcarinal lymph node and right hilar adenopathy on recent imaging. She's been followed by primary care and is scheduled for followup CT scan  Plan; start Augmentin 875 mg by mouth twice daily x10 days Bentyl 10 mg 2-3 times daily as needed for cramping Patient is asked to start MiraLax 17 g in 8 ounces of water daily over the next 3-4 days until her bowels are moving normally than use as needed CBC with differential today Full liquid 2 bland diet as tolerated Patient is asked to call back in 4-5 days if her symptoms are not significantly improved will need to be reevaluated and perhaps have imaging.

## 2013-05-01 NOTE — Progress Notes (Signed)
Reviewed and agree.

## 2013-05-01 NOTE — Telephone Encounter (Signed)
Patient with a 5 days history of severe cramping and abdominal pain.  She will come in today and see Amy Esterwood PA today at 11:00

## 2013-05-01 NOTE — Patient Instructions (Signed)
Please go to the basement level to have your labs drawn.  We sent prescriptions to [address]  Take Miralax,  17 grams in juice or water daily over the next 3-4 days until you bowel move.  Try a full liquid diet to bland diet for a few days.   Call us Monday if you are not feeling better.  [phone].

## 2013-05-05 ENCOUNTER — Ambulatory Visit (INDEPENDENT_AMBULATORY_CARE_PROVIDER_SITE_OTHER)
Admission: RE | Admit: 2013-05-05 | Discharge: 2013-05-05 | Disposition: A | Discharge status: Home / Self Care | Payer: Medicare Other | Source: Ambulatory Visit | Attending: Internal Medicine | Admitting: Internal Medicine

## 2013-05-05 DIAGNOSIS — C8582 Other specified types of non-Hodgkin lymphoma, intrathoracic lymph nodes: Secondary | ICD-10-CM | POA: Diagnosis not present

## 2013-05-05 DIAGNOSIS — I10 Essential (primary) hypertension: Secondary | ICD-10-CM | POA: Diagnosis not present

## 2013-05-05 DIAGNOSIS — R599 Enlarged lymph nodes, unspecified: Secondary | ICD-10-CM

## 2013-05-05 DIAGNOSIS — R590 Localized enlarged lymph nodes: Secondary | ICD-10-CM

## 2013-05-05 MED ORDER — IOHEXOL 300 MG/ML  SOLN
80.0000 mL | Freq: Once | INTRAMUSCULAR | Status: AC | PRN
  Administered 2013-05-05: 80 mL via INTRAVENOUS

## 2013-05-06 ENCOUNTER — Other Ambulatory Visit: Payer: Self-pay | Admitting: Internal Medicine

## 2013-05-06 DIAGNOSIS — R918 Other nonspecific abnormal finding of lung field: Secondary | ICD-10-CM

## 2013-05-06 DIAGNOSIS — R590 Localized enlarged lymph nodes: Secondary | ICD-10-CM

## 2013-05-12 ENCOUNTER — Encounter: Payer: Self-pay | Admitting: Internal Medicine

## 2013-05-12 ENCOUNTER — Ambulatory Visit (INDEPENDENT_AMBULATORY_CARE_PROVIDER_SITE_OTHER): Payer: Medicare Other | Admitting: Internal Medicine

## 2013-05-12 VITALS — BP 110/80 | HR 65 | Temp 97.7°F | Ht 62.0 in | Wt 134.6 lb

## 2013-05-12 DIAGNOSIS — R590 Localized enlarged lymph nodes: Secondary | ICD-10-CM

## 2013-05-12 DIAGNOSIS — R599 Enlarged lymph nodes, unspecified: Secondary | ICD-10-CM

## 2013-05-12 NOTE — Patient Instructions (Signed)
Please see patient coordinator before you leave today  to schedule PET scan and we will call you when report is available to decide next step

## 2013-05-12 NOTE — Progress Notes (Signed)
Subjective:    Patient ID: Erica Young, female    DOB: 01-Jul-1940   MRN: [id number]  HPI  73 yo female from [place]   quit smoking 1993 with sinus problems since 2004 s Difficulty  breathing but some tendency to cough mostly with sinus flares  then fell 12/15/14and found to have abn ct so referred 05/12/2013 by Dr Dierdre Highman for possible fob.  05/12/2013 1st [facility] office visit/ Anup Brigham s/p lumpectomy on R and RT 2007 but declined chemo and has completely recovered from a fall in mid Dec 2014 s cp, cough, sob  No obvious day to day or daytime variabilty or assoc   chest tightness, subjective wheeze overt sinus or hb symptoms. No unusual exp hx or h/o childhood pna/ asthma or knowledge of premature birth.  Sleeping ok without nocturnal  or early am exacerbation  of respiratory  c/o's or need for noct saba. Also denies any obvious fluctuation of symptoms with weather or environmental changes or other aggravating or alleviating factors except as outlined above   Current Medications, Allergies, Complete Past Medical History, Past Surgical History, Family History, and Social History were reviewed in [employer] record.            Review of Systems  Constitutional: Negative for fever, chills and unexpected weight change.  HENT: Positive for congestion and dental problem. Negative for ear pain, nosebleeds, postnasal drip, rhinorrhea, sinus pressure, sneezing, sore throat, trouble swallowing and voice change.   Eyes: Negative for visual disturbance.  Respiratory: Negative for cough, choking and shortness of breath.   Cardiovascular: Negative for chest pain and leg swelling.  Gastrointestinal: Negative for vomiting, abdominal pain and diarrhea.  Genitourinary: Negative for difficulty urinating.       Indigestion  Musculoskeletal: Negative for arthralgias.  Skin: Negative for rash.  Neurological: Negative for tremors, syncope and headaches.  Hematological: Does  not bruise/bleed easily.       Objective:   Physical Exam  Wt Readings from Last 3 Encounters:  05/12/13 134 lb 9.6 oz (61.054 kg)  05/01/13 134 lb (60.782 kg)  04/27/13 140 lb (63.504 kg)     HEENT: nl dentition, turbinates, and orophanx. Nl external ear canals without cough reflex   NECK :  without JVD/Nodes/TM/ nl carotid upstrokes bilaterally   LUNGS: no acc muscle use, clear to A and P bilaterally without cough on insp or exp maneuvers   CV:  RRR  no s3 or murmur or increase in P2, no edema   ABD:  soft and nontender with nl excursion in the supine position. No bruits or organomegaly, bowel sounds nl  MS:  warm without deformities, calf tenderness, cyanosis or clubbing  SKIN: warm and dry without lesions    NEURO:  alert, approp, no deficits    05/05/13 CT Chest Musculoskeletal: There are no aggressive appearing lytic or blastic  lesions noted in the visualized portions of the skeleton.  IMPRESSION:  1. Extensive mediastinal and right hilar lymphadenopathy, as  detailed above. Findings are highly concerning for malignancy, and  could represent a manifestation of lymphoma, or primary bronchogenic  malignancy such as a small cell carcinoma. Conceivably, this pattern  of disease could be seen in the setting of metastatic disease from  breast cancer, however, this particular pattern would be highly  unusual and is not strongly favored at this time. Correlation with  bronchoscopic biopsy is suggested for tissue diagnosis.  2. Multiple small pulmonary nodules scattered throughout the lungs  bilaterally,  the largest of which measures only 9 mm in the  periphery of the right lower lobe. Attention on followup studies is  recommended, as these lesions could certainly represent metastatic  disease to the lungs but are currently nonspecific.  3. Small amount of pericardial fluid and/or thickening      Assessment & Plan:

## 2013-05-13 NOTE — Assessment & Plan Note (Signed)
Agree with radiology this is most likely bronchogenic ca in a former smoker  though breast ca is a concern and need PET in this setting before fob to see if less invasive tissue available for diagnosis which will require either fob or EBUS (which requires gen anesthesia).  Discussed in detail all the  indications, usual  risks and alternatives  relative to the benefits with patient who agrees to proceed with PET first

## 2013-05-21 ENCOUNTER — Ambulatory Visit (HOSPITAL_COMMUNITY)
Admission: RE | Admit: 2013-05-21 | Discharge: 2013-05-21 | Disposition: A | Discharge status: Home / Self Care | Payer: Medicare Other | Source: Ambulatory Visit | Attending: Internal Medicine | Admitting: Internal Medicine

## 2013-05-21 ENCOUNTER — Encounter (HOSPITAL_COMMUNITY): Payer: Self-pay

## 2013-05-21 DIAGNOSIS — R918 Other nonspecific abnormal finding of lung field: Secondary | ICD-10-CM | POA: Diagnosis not present

## 2013-05-21 DIAGNOSIS — Z853 Personal history of malignant neoplasm of breast: Secondary | ICD-10-CM | POA: Insufficient documentation

## 2013-05-21 DIAGNOSIS — M949 Disorder of cartilage, unspecified: Secondary | ICD-10-CM

## 2013-05-21 DIAGNOSIS — M899 Disorder of bone, unspecified: Secondary | ICD-10-CM | POA: Diagnosis not present

## 2013-05-21 DIAGNOSIS — R911 Solitary pulmonary nodule: Secondary | ICD-10-CM | POA: Diagnosis not present

## 2013-05-21 DIAGNOSIS — R590 Localized enlarged lymph nodes: Secondary | ICD-10-CM

## 2013-05-21 DIAGNOSIS — R599 Enlarged lymph nodes, unspecified: Secondary | ICD-10-CM | POA: Insufficient documentation

## 2013-05-21 LAB — GLUCOSE, CAPILLARY: Glucose-Capillary: 112 mg/dL — ABNORMAL HIGH (ref 70–99)

## 2013-05-21 MED ORDER — FLUDEOXYGLUCOSE F - 18 (FDG) INJECTION
19.0000 | Freq: Once | INTRAVENOUS | Status: AC | PRN
  Administered 2013-05-21: 19 via INTRAVENOUS

## 2013-05-22 ENCOUNTER — Encounter: Payer: Self-pay | Admitting: Internal Medicine

## 2013-05-22 ENCOUNTER — Other Ambulatory Visit: Payer: Self-pay | Admitting: Internal Medicine

## 2013-05-22 DIAGNOSIS — R590 Localized enlarged lymph nodes: Secondary | ICD-10-CM

## 2013-05-22 NOTE — Progress Notes (Signed)
Quick Note:  Spoke with pt and notified of results per Dr. Wert. Pt verbalized understanding and denied any questions.  ______ 

## 2013-05-25 ENCOUNTER — Telehealth: Payer: Self-pay | Admitting: Internal Medicine

## 2013-05-25 DIAGNOSIS — R590 Localized enlarged lymph nodes: Secondary | ICD-10-CM

## 2013-05-25 NOTE — Telephone Encounter (Signed)
Notes Recorded by Tanda Rockers, MD on 05/22/2013 at 9:44 AM Call patient : I reviewed the study with her and told her we'd call to set up her up for CT biopsy by IR (can be done by u/s also if IR prefers -you can put that in the request when you order it ---  I called and spoke with Malachy Mood. She reports radilogy is recommended it been done by u/s. Needs Order changed. i have done so. Nothing further needed

## 2013-05-26 ENCOUNTER — Encounter (HOSPITAL_COMMUNITY): Payer: Self-pay | Admitting: Pharmacy Technician

## 2013-05-28 ENCOUNTER — Encounter (HOSPITAL_COMMUNITY): Payer: Self-pay | Admitting: Pharmacy Technician

## 2013-05-28 ENCOUNTER — Other Ambulatory Visit: Payer: Self-pay | Admitting: Radiology

## 2013-06-01 ENCOUNTER — Ambulatory Visit (HOSPITAL_COMMUNITY): Admission: RE | Admit: 2013-06-01 | Payer: Medicare Other | Source: Ambulatory Visit

## 2013-06-02 ENCOUNTER — Encounter (HOSPITAL_COMMUNITY): Payer: Self-pay

## 2013-06-02 ENCOUNTER — Ambulatory Visit (HOSPITAL_COMMUNITY)
Admission: RE | Admit: 2013-06-02 | Discharge: 2013-06-02 | Disposition: A | Discharge status: Home / Self Care | Payer: Medicare Other | Source: Ambulatory Visit | Attending: Interventional Radiology | Admitting: Interventional Radiology

## 2013-06-02 ENCOUNTER — Ambulatory Visit (HOSPITAL_COMMUNITY)
Admission: RE | Admit: 2013-06-02 | Discharge: 2013-06-02 | Disposition: A | Discharge status: Home / Self Care | Payer: Medicare Other | Source: Ambulatory Visit | Attending: Internal Medicine | Admitting: Internal Medicine

## 2013-06-02 DIAGNOSIS — R590 Localized enlarged lymph nodes: Secondary | ICD-10-CM

## 2013-06-02 DIAGNOSIS — E785 Hyperlipidemia, unspecified: Secondary | ICD-10-CM | POA: Diagnosis not present

## 2013-06-02 DIAGNOSIS — I10 Essential (primary) hypertension: Secondary | ICD-10-CM | POA: Insufficient documentation

## 2013-06-02 DIAGNOSIS — K7689 Other specified diseases of liver: Secondary | ICD-10-CM | POA: Insufficient documentation

## 2013-06-02 DIAGNOSIS — Z853 Personal history of malignant neoplasm of breast: Secondary | ICD-10-CM | POA: Insufficient documentation

## 2013-06-02 DIAGNOSIS — E042 Nontoxic multinodular goiter: Secondary | ICD-10-CM | POA: Insufficient documentation

## 2013-06-02 DIAGNOSIS — E041 Nontoxic single thyroid nodule: Secondary | ICD-10-CM | POA: Diagnosis not present

## 2013-06-02 DIAGNOSIS — Z87891 Personal history of nicotine dependence: Secondary | ICD-10-CM | POA: Diagnosis not present

## 2013-06-02 DIAGNOSIS — Z8673 Personal history of transient ischemic attack (TIA), and cerebral infarction without residual deficits: Secondary | ICD-10-CM | POA: Insufficient documentation

## 2013-06-02 DIAGNOSIS — D449 Neoplasm of uncertain behavior of unspecified endocrine gland: Secondary | ICD-10-CM | POA: Diagnosis not present

## 2013-06-02 LAB — CBC
HCT: 36.5 % (ref 36.0–46.0)
Hemoglobin: 12.9 g/dL (ref 12.0–15.0)
MCH: 32.7 pg (ref 26.0–34.0)
MCHC: 35.3 g/dL (ref 30.0–36.0)
MCV: 92.6 fL (ref 78.0–100.0)
Platelets: 159 10*3/uL (ref 150–400)
RBC: 3.94 MIL/uL (ref 3.87–5.11)
RDW: 12.6 % (ref 11.5–15.5)
WBC: 4.3 10*3/uL (ref 4.0–10.5)

## 2013-06-02 LAB — PROTIME-INR
INR: 1.05 (ref 0.00–1.49)
PROTHROMBIN TIME: 13.5 s (ref 11.6–15.2)

## 2013-06-02 LAB — APTT: APTT: 31 s (ref 24–37)

## 2013-06-02 MED ORDER — FENTANYL CITRATE 0.05 MG/ML IJ SOLN
INTRAMUSCULAR | Status: AC
  Filled 2013-06-02: qty 4

## 2013-06-02 MED ORDER — SODIUM CHLORIDE 0.9 % IV SOLN: Freq: Once | INTRAVENOUS | Status: DC

## 2013-06-02 MED ORDER — MIDAZOLAM HCL 2 MG/2ML IJ SOLN
INTRAMUSCULAR | Status: AC
  Filled 2013-06-02: qty 2

## 2013-06-02 MED ORDER — FENTANYL CITRATE 0.05 MG/ML IJ SOLN
INTRAMUSCULAR | Status: AC | PRN
  Administered 2013-06-02 (×6): 50 ug via INTRAVENOUS

## 2013-06-02 MED ORDER — MIDAZOLAM HCL 2 MG/2ML IJ SOLN
INTRAMUSCULAR | Status: AC | PRN
  Administered 2013-06-02: 1 mg via INTRAVENOUS
  Administered 2013-06-02: 2 mg via INTRAVENOUS
  Administered 2013-06-02 (×3): 1 mg via INTRAVENOUS

## 2013-06-02 MED ORDER — FENTANYL CITRATE 0.05 MG/ML IJ SOLN
INTRAMUSCULAR | Status: AC
  Filled 2013-06-02: qty 2

## 2013-06-02 MED ORDER — MIDAZOLAM HCL 2 MG/2ML IJ SOLN
INTRAMUSCULAR | Status: AC
  Filled 2013-06-02: qty 4

## 2013-06-02 NOTE — ED Notes (Signed)
MD informed pt received total RX:  Versed 6 mg and Fentanyl 300 mcg throughout 50 min thyroid bx procedure.

## 2013-06-02 NOTE — ED Notes (Signed)
Patient denies pain and is resting comfortably.  

## 2013-06-02 NOTE — Procedures (Addendum)
[place] R  thyroid lesion FNA 25g x3 to cyto, then 18g core x4 after quick stain No complication No blood loss. See complete dictation in [facility].

## 2013-06-02 NOTE — Discharge Instructions (Signed)
Biopsy Care After These instructions give you information on caring for yourself after your procedure. Your doctor may also give you more specific instructions. Call your doctor if you have any problems or questions after your procedure. HOME CARE   Return to your normal diet and activities as told by your doctor.  Change your bandages (dressings) as told by your doctor. If skin glue (adhesive) was used, it will peel off in 7 days.  Only take medicines as told by your doctor.  Ask your doctor when you can bathe and get your wound wet. GET HELP RIGHT AWAY IF:  You see more than a small spot of blood coming from the wound.  You have redness, puffiness (swelling), or pain.  You see yellowish-white fluid (pus) coming from the wound.  You have a fever.  You notice a bad smell coming from the wound or bandage.  You have a rash, trouble breathing, or any allergy problems. MAKE SURE YOU:   Understand these instructions.  Will watch your condition.  Will get help right away if you are not doing well or get worse. Document Released: 12/19/2010 Document Revised: 07/09/2011 Document Reviewed: 12/19/2010 [facility] Patient Information 2014 [place], [place].

## 2013-06-02 NOTE — ED Notes (Signed)
MD back to take core bx's.  Informed pt rec'd versed 4mg  and fentanyl 200 mcg previously.  Instructed to admin additional sedation RX per MD and pt request

## 2013-06-02 NOTE — H&P (Signed)
Erica Young is an 73 y.o. female.   Chief Complaint: pt was in normal state of health and suffered fall in 03/2013 Work up of complaints included CT to evaluate spleen CT revealed abnormal findings: Hilar lymphadenopathy and pulmonary nodules PET 05/21/13 reveals + LAN Pt with Hx smoker - quit 1993 and Hx Breast Ca Now scheduled for R supraclavicular lymph node biopsy  HPI: HLD; hypothyroid; CVA; Breast Ca; fatty liver; HTN; previous smoker  Past Medical History  Diagnosis Date  . HYPOTHYROIDISM 03/03/2010  . HYPERLIPIDEMIA 03/03/2010  . CEREBROVASCULAR DISEASE 03/03/2010  . DIVERTICULOSIS, COLON 03/03/2010  . Low back pain   . Right leg pain   . History of cerebral artery stenosis     right middle  . Mastoiditis     noted on brain MRI  . Foramen ovale     positive bubble study  . Diverticulitis   . Fatty liver 08/02/09    as per U/S done by [facility]  . Internal hemorrhoid   . Stroke   . Anxiety   . Hypertension   . Ulcer   . Cancer     right breat ca  , lumpectomy and radiation tx (declined chemo and additional prophylactic meds)    Past Surgical History  Procedure Laterality Date  . Cholecystectomy    . Breast lumpectomy      right  . Tonsillectomy      Family History  Problem Relation Age of Onset  . Heart disease Sister     cerebral vascular disease also  . Heart disease Brother     cerebral vascular disease also  . Heart disease Brother     cerebral vascular disease  . Heart disease Sister     cebreal vascular disease also  . Heart disease Sister     cebreal vascular diease also  . Heart disease Sister     cerebral vascular diease also  . Colon cancer Neg Hx   . Esophageal cancer Neg Hx   . Rectal cancer Neg Hx   . Stomach cancer Father    Social History:  reports that she quit smoking about 22 years ago. Her smoking use included Cigarettes. She has a 10 pack-year smoking history. She has never used smokeless tobacco. She reports that she  does not drink alcohol or use illicit drugs.  Allergies:  Allergies  Allergen Reactions  . Ciprofloxacin Anaphylaxis     (Not in a hospital admission)  No results found for this or any previous visit (from the past 48 hour(s)). No results found.  Review of Systems  Constitutional: Negative for fever and weight loss.  Respiratory: Negative for cough and shortness of breath.   Gastrointestinal: Negative for nausea and vomiting.  Musculoskeletal: Negative for neck pain.  Neurological: Negative for dizziness, weakness and headaches.  Psychiatric/Behavioral: The patient is not nervous/anxious.     Blood pressure 133/77, pulse 73, temperature 97.6 F (36.4 C), temperature source Oral, resp. rate 18, height 5\' 2"  (1.575 m), weight 134 lb (60.782 kg), SpO2 99.00%. Physical Exam  Constitutional: She is oriented to person, place, and time. She appears well-developed and well-nourished.  Cardiovascular: Normal rate and regular rhythm.   No murmur heard. Respiratory: Effort normal and breath sounds normal. She has no wheezes.  GI: Soft. Bowel sounds are normal. There is no tenderness.  Musculoskeletal: Normal range of motion.  Neurological: She is alert and oriented to person, place, and time.  Skin: Skin is warm and dry.  Psychiatric:  She has a normal mood and affect. Her behavior is normal. Judgment and thought content normal.     Assessment/Plan [place] 12/14 Work up revealed coincidental findings of hilar LAN and pulm nodules Hx Breast Ca; previous smoker Now scheduled for R SCLN biopsy Pt aware of procedure benefits and risks and agreeable to proceed Consent signed and in chart  [place] A 06/02/2013, 9:39 AM

## 2013-06-03 ENCOUNTER — Encounter: Payer: Self-pay | Admitting: Internal Medicine

## 2013-06-04 ENCOUNTER — Telehealth: Payer: Self-pay | Admitting: Internal Medicine

## 2013-06-04 DIAGNOSIS — I10 Essential (primary) hypertension: Secondary | ICD-10-CM

## 2013-06-04 DIAGNOSIS — E039 Hypothyroidism, unspecified: Secondary | ICD-10-CM

## 2013-06-04 DIAGNOSIS — E785 Hyperlipidemia, unspecified: Secondary | ICD-10-CM

## 2013-06-04 DIAGNOSIS — I679 Cerebrovascular disease, unspecified: Secondary | ICD-10-CM

## 2013-06-04 NOTE — Telephone Encounter (Signed)
Pt would like to come in prior to her 6 mo fup in April for labs. No orders in system. pls advise

## 2013-06-04 NOTE — Telephone Encounter (Signed)
ok 

## 2013-06-04 NOTE — Telephone Encounter (Signed)
Dr. Raliegh Ip, pt scheduled for 6 month follow up in April and wants labs done prior to visit. Please advise.

## 2013-06-04 NOTE — Telephone Encounter (Signed)
Spoke to pt told her labs will be ordered at visit, can not do prior to visit due to insurance. Pt stated she always has labs done prior to visit her insurance will pay. Told her okay will check with Dr. Raliegh Ip to see what labs need to be ordered prior to visit and get back to her. Pt verbalized understanding.

## 2013-06-04 NOTE — Telephone Encounter (Signed)
Cbc cmet lipids tsh u/a

## 2013-06-04 NOTE — Telephone Encounter (Signed)
Dr. Raliegh Ip, please advise what labs need to be ordered?

## 2013-06-05 NOTE — Telephone Encounter (Signed)
Spoke to pt told her lab orders are in and she can schedule appointment for labs week before appt with Dr. Raliegh Ip. Told pt also need to be fasting nothing to eat or drink after midnight except water. Pt verbalized understanding.

## 2013-06-08 ENCOUNTER — Telehealth: Payer: Self-pay | Admitting: Internal Medicine

## 2013-06-08 ENCOUNTER — Encounter: Payer: Self-pay | Admitting: Internal Medicine

## 2013-06-08 NOTE — Telephone Encounter (Signed)
Pt husband is calling requesting that dr.k provide some direction on what the next step is for pt, pt states she was a little confused on how the specialist explained the results. States biopsy was done last week and pt did not understand how it was explained to her.

## 2013-06-09 ENCOUNTER — Encounter: Payer: Self-pay | Admitting: Internal Medicine

## 2013-06-16 ENCOUNTER — Ambulatory Visit: Payer: Medicare Other | Admitting: Internal Medicine

## 2013-06-17 ENCOUNTER — Other Ambulatory Visit: Payer: Self-pay | Admitting: Internal Medicine

## 2013-06-17 ENCOUNTER — Other Ambulatory Visit: Payer: Self-pay | Admitting: Obstetrics and Gynecology

## 2013-06-17 ENCOUNTER — Other Ambulatory Visit: Payer: Self-pay

## 2013-06-17 DIAGNOSIS — Z853 Personal history of malignant neoplasm of breast: Secondary | ICD-10-CM

## 2013-06-17 DIAGNOSIS — Z9889 Other specified postprocedural states: Secondary | ICD-10-CM

## 2013-06-19 ENCOUNTER — Other Ambulatory Visit (INDEPENDENT_AMBULATORY_CARE_PROVIDER_SITE_OTHER): Payer: Medicare Other

## 2013-06-19 ENCOUNTER — Other Ambulatory Visit: Payer: Self-pay | Admitting: Internal Medicine

## 2013-06-19 ENCOUNTER — Ambulatory Visit (INDEPENDENT_AMBULATORY_CARE_PROVIDER_SITE_OTHER): Payer: Medicare Other | Admitting: Internal Medicine

## 2013-06-19 ENCOUNTER — Encounter: Payer: Self-pay | Admitting: Internal Medicine

## 2013-06-19 ENCOUNTER — Ambulatory Visit (INDEPENDENT_AMBULATORY_CARE_PROVIDER_SITE_OTHER)
Admission: RE | Admit: 2013-06-19 | Discharge: 2013-06-19 | Disposition: A | Discharge status: Home / Self Care | Payer: Medicare Other | Source: Ambulatory Visit | Attending: Internal Medicine | Admitting: Internal Medicine

## 2013-06-19 VITALS — BP 104/62 | HR 71 | Temp 97.7°F | Ht 62.0 in | Wt 129.6 lb

## 2013-06-19 DIAGNOSIS — R599 Enlarged lymph nodes, unspecified: Secondary | ICD-10-CM

## 2013-06-19 DIAGNOSIS — E039 Hypothyroidism, unspecified: Secondary | ICD-10-CM | POA: Diagnosis not present

## 2013-06-19 DIAGNOSIS — R590 Localized enlarged lymph nodes: Secondary | ICD-10-CM

## 2013-06-19 DIAGNOSIS — I679 Cerebrovascular disease, unspecified: Secondary | ICD-10-CM

## 2013-06-19 DIAGNOSIS — I10 Essential (primary) hypertension: Secondary | ICD-10-CM

## 2013-06-19 DIAGNOSIS — E785 Hyperlipidemia, unspecified: Secondary | ICD-10-CM | POA: Diagnosis not present

## 2013-06-19 DIAGNOSIS — R918 Other nonspecific abnormal finding of lung field: Secondary | ICD-10-CM | POA: Diagnosis not present

## 2013-06-19 LAB — CBC WITH DIFFERENTIAL/PLATELET
Basophils Absolute: 0 10*3/uL (ref 0.0–0.1)
Basophils Relative: 0.5 % (ref 0.0–3.0)
EOS ABS: 0.2 10*3/uL (ref 0.0–0.7)
Eosinophils Relative: 2.8 % (ref 0.0–5.0)
HCT: 40.5 % (ref 36.0–46.0)
HEMOGLOBIN: 13.3 g/dL (ref 12.0–15.0)
LYMPHS PCT: 25.2 % (ref 12.0–46.0)
Lymphs Abs: 1.5 10*3/uL (ref 0.7–4.0)
MCHC: 32.9 g/dL (ref 30.0–36.0)
MCV: 98.2 fl (ref 78.0–100.0)
Monocytes Absolute: 0.3 10*3/uL (ref 0.1–1.0)
Monocytes Relative: 5.5 % (ref 3.0–12.0)
NEUTROS ABS: 4 10*3/uL (ref 1.4–7.7)
NEUTROS PCT: 66 % (ref 43.0–77.0)
PLATELETS: 189 10*3/uL (ref 150.0–400.0)
RBC: 4.12 Mil/uL (ref 3.87–5.11)
RDW: 14 % (ref 11.5–14.6)
WBC: 6 10*3/uL (ref 4.5–10.5)

## 2013-06-19 LAB — SEDIMENTATION RATE: Sed Rate: 18 mm/hr (ref 0–22)

## 2013-06-19 NOTE — Progress Notes (Signed)
Subjective:   Patient ID: Erica Young, female    DOB: 01-Jul-1940   MRN: [id number]    Brief patient profile:  73 yo female from [place]   quit smoking 1993 with sinus problems since 2004 s Difficulty  breathing but some tendency to cough mostly with sinus flares  then fell 12/15/14and found to have abn ct so referred 05/12/2013 by Dr Dierdre Highman for possible fob.   History of Present Illness  05/12/2013 1st [facility] office visit/ Madolin Twaddle s/p lumpectomy on R and RT 2007 but declined chemo and has completely recovered from a fall in mid Dec 2014 s cp, cough, sob. rec PET > Pos med/hilar nodes and neck mass>  IR Bx > neg   06/19/2013 f/u ov/Jonel Weldon re: med/hilar adenopathy Chief Complaint  Patient presents with  . Follow-up    Pt c/o discomort in center and rt side of chest since 05/22/13 after PET scan was done.   tenderness over skin reproduced by rubbing, improving since onset  No obvious day to day or daytime variabilty or assoc chronic cough or cp or chest tightness, subjective wheeze overt sinus or hb symptoms. No unusual exp hx or h/o childhood pna/ asthma or knowledge of premature birth.  Sleeping ok without nocturnal  or early am exacerbation  of respiratory  c/o's or need for noct saba. Also denies any obvious fluctuation of symptoms with weather or environmental changes or other aggravating or alleviating factors except as outlined above   Current Medications, Allergies, Complete Past Medical History, Past Surgical History, Family History, and Social History were reviewed in [employer] record.  ROS  The following are not active complaints unless bolded sore throat, dysphagia, dental problems, itching, sneezing,  nasal congestion or excess/ purulent secretions, ear ache,   fever, chills, sweats, ? unintended wt loss, pleuritic or exertional cp, hemoptysis,  orthopnea pnd or leg swelling, presyncope, palpitations, heartburn, abdominal pain, anorexia,  nausea, vomiting, diarrhea  or change in bowel or urinary habits, change in stools or urine, dysuria,hematuria,  rash, arthralgias, visual complaints, headache, numbness weakness or ataxia or problems with walking or coordination,  change in mood/affect or memory.                      Objective:   Physical Exam  06/19/13            129  Wt Readings from Last 3 Encounters:  05/12/13 134 lb 9.6 oz (61.054 kg)  05/01/13 134 lb (60.782 kg)  04/27/13 140 lb (63.504 kg)     HEENT: nl dentition, turbinates, and orophanx. Nl external ear canals without cough reflex   NECK :  without JVD/Nodes/TM/ nl carotid upstrokes bilaterally   LUNGS: no acc muscle use, clear to A and P bilaterally without cough on insp or exp maneuvers - no superficial tenderness or rash over R ant chest    CV:  RRR  no s3 or murmur or increase in P2, no edema   ABD:  soft and nontender with nl excursion in the supine position. No bruits or organomegaly, bowel sounds nl  MS:  warm without deformities, calf tenderness, cyanosis or clubbing  SKIN: warm and dry without lesions    NEURO:  alert, approp, no deficits    cxr 06/19/13  No acute cardiopulmonary abnormality seen. No definite evidence of hilar adenopathy is seen on this exam, although CT scan would be more sensitive         Assessment &  Plan:

## 2013-06-19 NOTE — Patient Instructions (Addendum)
Please remember to go to the lab and x-ray department downstairs for your tests - we will call you with the results when they are available.     You will need to decide three options.  1) come back in 3 months - which I would favor if chest xray is normal and labs are ok, if not >>  2) Bronchoscopy with EBUS (Dr Lamonte Sakai put to sleep/loweryield vs #3)  3) Mediastinoscopy (a high yield but it's more invasive)

## 2013-06-20 NOTE — Assessment & Plan Note (Addendum)
-  See CT Chest 05/05/12  - PET 05/21/13 1. Hypermetabolic paratracheal, subcarinal, and right hilar adenopathy consistent with metastatic adenopathy. 2. Hypermetabolic focus adjacent to the right lobe of thyroid gland. This either represents a metastatic supraclavicular lymph node, metastasis to the right thyroid gland, or less likely prior primary thyroid carcinoma. Favor a metastatic lymph node. Ultrasound evaluation the neck the could delineate and serve as a mechanism of Biopsy > done 06/02/13 and neg for malignancy  I had an extended discussion with the patient today lasting 15 to 20 minutes of a 25 minute visit on the following issues:   I am worried that there is a pathologic process here(note 11 lbs wt down since 03/2014 but much of this may be related to anxiety re dx)  but the PET does not give [place] any kind of specific info and it could turn out this is some from of chronic granulomatous process and since here cxr is nl s a baseline "nl CT chest" we can't put a date on the onset of this problem > offered watch and wait vs ebus vs mediastinoscopy and she chose the first option for now  I did order a baseline ESR  Now to see if trend is high enough to argue to more aggressive tissue dx

## 2013-06-22 ENCOUNTER — Telehealth: Payer: Self-pay | Admitting: Internal Medicine

## 2013-06-22 ENCOUNTER — Encounter: Payer: Self-pay | Admitting: Internal Medicine

## 2013-06-22 NOTE — Progress Notes (Signed)
Quick Note:  Spoke with pt and notified of results per Dr. Wert. Pt verbalized understanding and denied any questions.  ______ 

## 2013-06-22 NOTE — Telephone Encounter (Signed)
Relevant patient education assigned to patient using Emmi. ° °

## 2013-06-30 ENCOUNTER — Ambulatory Visit
Admission: RE | Admit: 2013-06-30 | Discharge: 2013-06-30 | Disposition: A | Discharge status: Home / Self Care | Payer: Medicare Other | Source: Ambulatory Visit | Attending: Obstetrics and Gynecology | Admitting: Obstetrics and Gynecology

## 2013-06-30 DIAGNOSIS — Z853 Personal history of malignant neoplasm of breast: Secondary | ICD-10-CM

## 2013-06-30 DIAGNOSIS — R928 Other abnormal and inconclusive findings on diagnostic imaging of breast: Secondary | ICD-10-CM | POA: Diagnosis not present

## 2013-06-30 DIAGNOSIS — Z9889 Other specified postprocedural states: Secondary | ICD-10-CM

## 2013-07-02 ENCOUNTER — Telehealth: Payer: Self-pay | Admitting: *Deleted

## 2013-07-02 NOTE — Telephone Encounter (Signed)
Spoke to pt told her Mammogram done 3/3 was Benign. Pt verbalized understanding.

## 2013-08-03 ENCOUNTER — Other Ambulatory Visit (INDEPENDENT_AMBULATORY_CARE_PROVIDER_SITE_OTHER): Payer: Medicare Other

## 2013-08-03 DIAGNOSIS — E039 Hypothyroidism, unspecified: Secondary | ICD-10-CM

## 2013-08-03 DIAGNOSIS — I679 Cerebrovascular disease, unspecified: Secondary | ICD-10-CM

## 2013-08-03 DIAGNOSIS — I10 Essential (primary) hypertension: Secondary | ICD-10-CM | POA: Diagnosis not present

## 2013-08-03 DIAGNOSIS — E785 Hyperlipidemia, unspecified: Secondary | ICD-10-CM | POA: Diagnosis not present

## 2013-08-03 LAB — BASIC METABOLIC PANEL
BUN: 9 mg/dL (ref 6–23)
CHLORIDE: 105 meq/L (ref 96–112)
CO2: 28 meq/L (ref 19–32)
Calcium: 9 mg/dL (ref 8.4–10.5)
Creatinine, Ser: 0.6 mg/dL (ref 0.4–1.2)
GFR: 98.38 mL/min (ref >60.00)
GLUCOSE: 106 mg/dL — AB (ref 70–99)
POTASSIUM: 4.3 meq/L (ref 3.5–5.1)
Sodium: 140 mEq/L (ref 135–145)

## 2013-08-03 LAB — CBC WITH DIFFERENTIAL/PLATELET
Basophils Absolute: 0 10*3/uL (ref 0.0–0.1)
Basophils Relative: 0.5 % (ref 0.0–3.0)
Eosinophils Absolute: 0.2 10*3/uL (ref 0.0–0.7)
Eosinophils Relative: 2.2 % (ref 0.0–5.0)
HEMATOCRIT: 39.4 % (ref 36.0–46.0)
Hemoglobin: 13.2 g/dL (ref 12.0–15.0)
Lymphocytes Relative: 17.9 % (ref 12.0–46.0)
Lymphs Abs: 1.3 10*3/uL (ref 0.7–4.0)
MCHC: 33.6 g/dL (ref 30.0–36.0)
MCV: 96.3 fl (ref 78.0–100.0)
MONOS PCT: 6.1 % (ref 3.0–12.0)
Monocytes Absolute: 0.5 10*3/uL (ref 0.1–1.0)
Neutro Abs: 5.4 10*3/uL (ref 1.4–7.7)
Neutrophils Relative %: 73.3 % (ref 43.0–77.0)
Platelets: 166 10*3/uL (ref 150.0–400.0)
RBC: 4.09 Mil/uL (ref 3.87–5.11)
RDW: 13.8 % (ref 11.5–14.6)
WBC: 7.4 10*3/uL (ref 4.5–10.5)

## 2013-08-03 LAB — POCT URINALYSIS DIPSTICK
BILIRUBIN UA: NEGATIVE
Glucose, UA: NEGATIVE
Ketones, UA: NEGATIVE
Leukocytes, UA: NEGATIVE
Nitrite, UA: NEGATIVE
Protein, UA: NEGATIVE
SPEC GRAV UA: 1.015
Urobilinogen, UA: 0.2
pH, UA: 7

## 2013-08-03 LAB — HEPATIC FUNCTION PANEL
ALT: 20 U/L (ref 0–35)
AST: 20 U/L (ref 0–37)
Albumin: 4 g/dL (ref 3.5–5.2)
Alkaline Phosphatase: 82 U/L (ref 39–117)
BILIRUBIN DIRECT: 0.1 mg/dL (ref 0.0–0.3)
Total Bilirubin: 0.8 mg/dL (ref 0.3–1.2)
Total Protein: 7 g/dL (ref 6.0–8.3)

## 2013-08-03 LAB — LIPID PANEL
CHOL/HDL RATIO: 2
Cholesterol: 143 mg/dL (ref 0–200)
HDL: 62 mg/dL (ref >39.00)
LDL CALC: 67 mg/dL (ref 0–99)
Triglycerides: 71 mg/dL (ref 0.0–149.0)
VLDL: 14.2 mg/dL (ref 0.0–40.0)

## 2013-08-03 LAB — TSH: TSH: 4.24 u[IU]/mL (ref 0.35–5.50)

## 2013-08-10 ENCOUNTER — Ambulatory Visit (INDEPENDENT_AMBULATORY_CARE_PROVIDER_SITE_OTHER): Payer: Medicare Other | Admitting: Internal Medicine

## 2013-08-10 ENCOUNTER — Encounter: Payer: Self-pay | Admitting: Internal Medicine

## 2013-08-10 VITALS — BP 126/70 | HR 72 | Temp 98.4°F | Resp 18 | Ht 62.0 in | Wt 127.0 lb

## 2013-08-10 DIAGNOSIS — R590 Localized enlarged lymph nodes: Secondary | ICD-10-CM

## 2013-08-10 DIAGNOSIS — I10 Essential (primary) hypertension: Secondary | ICD-10-CM | POA: Diagnosis not present

## 2013-08-10 DIAGNOSIS — R634 Abnormal weight loss: Secondary | ICD-10-CM

## 2013-08-10 DIAGNOSIS — R599 Enlarged lymph nodes, unspecified: Secondary | ICD-10-CM | POA: Diagnosis not present

## 2013-08-10 DIAGNOSIS — E785 Hyperlipidemia, unspecified: Secondary | ICD-10-CM | POA: Diagnosis not present

## 2013-08-10 DIAGNOSIS — L84 Corns and callosities: Secondary | ICD-10-CM

## 2013-08-10 DIAGNOSIS — E039 Hypothyroidism, unspecified: Secondary | ICD-10-CM

## 2013-08-10 MED ORDER — SIMVASTATIN 5 MG PO TABS: 5.0000 mg | ORAL_TABLET | Freq: Every day | ORAL | Status: DC

## 2013-08-10 NOTE — Patient Instructions (Addendum)
Return in 3 months for follow-up  Pulmonary follow up with Dr. Melvyn Novas  as scheduled  Liberalize your diet;  attempt to consume more calories and gained weight    It is important that you exercise regularly, at least 20 minutes 3 to 4 times per week.  If you develop chest pain or shortness of breath seek  medical attention.

## 2013-08-10 NOTE — Progress Notes (Signed)
Pre-visit discussion using our clinic review tool. No additional management support is needed unless otherwise documented below in the visit note.  

## 2013-08-10 NOTE — Progress Notes (Signed)
Subjective:    Patient ID: Erica Young, female    DOB: July 23, 1940, 73 y.o.   MRN: [id number]  HPI  73 year old patient who has a history of hypertension, dyslipidemia, and hypothyroidism.  She has been followed closely by pulmonary medicine due to an abnormal CT scan of the chest with hilar adenopathy.  She generally feels well, but continues to lose weight.  She has been adhering to a high fiber diet.  She did have some mild anterior chest wall pain, but this has been resolved for one month.  She states her appetite is excellent.  Recent laboratory screen unremarkable She wishes a lower dose of simvastatin.  Past Medical History  Diagnosis Date  . HYPOTHYROIDISM 03/03/2010  . HYPERLIPIDEMIA 03/03/2010  . CEREBROVASCULAR DISEASE 03/03/2010  . DIVERTICULOSIS, COLON 03/03/2010  . Low back pain   . Right leg pain   . History of cerebral artery stenosis     right middle  . Mastoiditis     noted on brain MRI  . Foramen ovale     positive bubble study  . Diverticulitis   . Fatty liver 08/02/09    as per U/S done by [facility]  . Internal hemorrhoid   . Stroke   . Anxiety   . Hypertension   . Ulcer   . Cancer     right breat ca  , lumpectomy and radiation tx (declined chemo and additional prophylactic meds)    History   Social History  . Marital Status: Married    Spouse Name: N/A    Number of Children: 2  . Years of Education: N/A   Occupational History  . Social Worker--Retired    Social History Main Topics  . Smoking status: Former Smoker -- 0.50 packs/day for 20 years    Types: Cigarettes    Quit date: 05/01/1991  . Smokeless tobacco: Never Used  . Alcohol Use: No  . Drug Use: No  . Sexual Activity: Yes   Other Topics Concern  . Not on file   Social History Narrative   Daily caffeine     Past Surgical History  Procedure Laterality Date  . Cholecystectomy    . Breast lumpectomy      right  . Tonsillectomy      Family History  Problem Relation  Age of Onset  . Heart disease Sister     cerebral vascular disease also  . Heart disease Brother     cerebral vascular disease also  . Heart disease Brother     cerebral vascular disease  . Heart disease Sister     cebreal vascular disease also  . Heart disease Sister     cebreal vascular diease also  . Heart disease Sister     cerebral vascular diease also  . Colon cancer Neg Hx   . Esophageal cancer Neg Hx   . Rectal cancer Neg Hx   . Stomach cancer Father     Allergies  Allergen Reactions  . Ciprofloxacin Anaphylaxis    Current Outpatient Prescriptions on File Prior to Visit  Medication Sig Dispense Refill  . ALPRAZolam (XANAX) 0.25 MG tablet Take 0.25 mg by mouth every 8 (eight) hours.       Marland Kitchen aspirin 81 MG tablet Take 81 mg by mouth daily.      . Flaxseed, Linseed, (FLAX SEEDS PO) Take 1 tablet by mouth every evening. Daily before supper      . losartan (COZAAR) 100 MG tablet Take 100 mg  by mouth daily.      . Probiotic Product (PROBIOTIC DAILY PO) Take 1 tablet by mouth daily.       No current facility-administered medications on file prior to visit.    BP 126/70  Pulse 72  Temp(Src) 98.4 F (36.9 C) (Oral)  Resp 18  Ht 5\' 2"  (1.575 m)  Wt 127 lb (57.607 kg)  BMI 23.22 kg/m2  SpO2 98%        Review of Systems  Constitutional: Positive for unexpected weight change.  HENT: Negative for congestion, dental problem, hearing loss, rhinorrhea, sinus pressure, sore throat and tinnitus.   Eyes: Negative for pain, discharge and visual disturbance.  Respiratory: Negative for cough and shortness of breath.   Cardiovascular: Negative for chest pain, palpitations and leg swelling.  Gastrointestinal: Negative for nausea, vomiting, abdominal pain, diarrhea, constipation, blood in stool and abdominal distention.  Genitourinary: Negative for dysuria, urgency, frequency, hematuria, flank pain, vaginal bleeding, vaginal discharge, difficulty urinating, vaginal pain and  pelvic pain.  Musculoskeletal: Negative for arthralgias, gait problem and joint swelling.  Skin: Negative for rash.  Neurological: Negative for dizziness, syncope, speech difficulty, weakness, numbness and headaches.  Hematological: Negative for adenopathy.  Psychiatric/Behavioral: Negative for behavioral problems, dysphoric mood and agitation. The patient is not nervous/anxious.        Objective:   Physical Exam  Constitutional: She is oriented to person, place, and time. She appears well-developed and well-nourished.  HENT:  Head: Normocephalic.  Right Ear: External ear normal.  Left Ear: External ear normal.  Mouth/Throat: Oropharynx is clear and moist.  Eyes: Conjunctivae and EOM are normal. Pupils are equal, round, and reactive to light.  Neck: Normal range of motion. Neck supple. No thyromegaly present.  Cardiovascular: Normal rate, regular rhythm, normal heart sounds and intact distal pulses.   Pulmonary/Chest: Effort normal and breath sounds normal.  Abdominal: Soft. Bowel sounds are normal. She exhibits no mass. There is no tenderness.  Musculoskeletal: Normal range of motion.  Lymphadenopathy:    She has no cervical adenopathy.  Neurological: She is alert and oriented to person, place, and time.  Skin: Skin is warm and dry. No rash noted.  Psychiatric: She has a normal mood and affect. Her behavior is normal.          Assessment & Plan:   Abnormal chest CT with hilar adenopathy.  Followup pulmonary medicine next month as scheduled Weight loss.  Patient was asked to liberalize her caloric intake and attempt to gain weight.  We'll continue to observe Hypertension well controlled Dyslipidemia.  Per her , request, we'll decrease simvastatin 2 5 mg daily

## 2013-08-11 ENCOUNTER — Telehealth: Payer: Self-pay | Admitting: Internal Medicine

## 2013-08-11 NOTE — Telephone Encounter (Signed)
Relevant patient education assigned to patient using Emmi. ° °

## 2013-08-24 ENCOUNTER — Telehealth: Payer: Self-pay | Admitting: Internal Medicine

## 2013-08-24 NOTE — Telephone Encounter (Addendum)
Pt does need ov with MW in May with cxr  Read his last AVS  ATC the pt, NA and no option to leave a msg

## 2013-08-25 NOTE — Telephone Encounter (Signed)
Spoke with pt and given 3 mth f/u with cxr with Dr Melvyn Novas.

## 2013-09-16 ENCOUNTER — Ambulatory Visit (INDEPENDENT_AMBULATORY_CARE_PROVIDER_SITE_OTHER): Payer: Medicare Other | Admitting: Internal Medicine

## 2013-09-16 ENCOUNTER — Ambulatory Visit (INDEPENDENT_AMBULATORY_CARE_PROVIDER_SITE_OTHER)
Admission: RE | Admit: 2013-09-16 | Discharge: 2013-09-16 | Disposition: A | Discharge status: Home / Self Care | Payer: Medicare Other | Source: Ambulatory Visit | Attending: Internal Medicine | Admitting: Internal Medicine

## 2013-09-16 ENCOUNTER — Encounter: Payer: Self-pay | Admitting: Internal Medicine

## 2013-09-16 VITALS — BP 130/66 | HR 71 | Temp 97.8°F | Ht 62.0 in | Wt 130.0 lb

## 2013-09-16 DIAGNOSIS — J310 Chronic rhinitis: Secondary | ICD-10-CM | POA: Diagnosis not present

## 2013-09-16 DIAGNOSIS — R599 Enlarged lymph nodes, unspecified: Secondary | ICD-10-CM

## 2013-09-16 DIAGNOSIS — R590 Localized enlarged lymph nodes: Secondary | ICD-10-CM

## 2013-09-16 DIAGNOSIS — R918 Other nonspecific abnormal finding of lung field: Secondary | ICD-10-CM | POA: Diagnosis not present

## 2013-09-16 NOTE — Progress Notes (Signed)
Subjective:   Patient ID: Erica Young, female    DOB: 10/19/1940   MRN: [id number]    Brief patient profile:  73 yo female from [place]   quit smoking 1993 with sinus problems since 2004 s Difficulty  breathing but some tendency to cough mostly with sinus flares  then fell 12/15/14and found to have abn ct so referred 05/12/2013 by Dr Dierdre Highman for possible fob.   History of Present Illness  05/12/2013 1st [facility] office visit/ Wert s/p lumpectomy on R and RT 2007 but declined chemo and has completely recovered from a fall in mid Dec 2014 s cp, cough, sob. rec PET > Pos med/hilar nodes and neck mass>  IR Bx > neg   06/19/2013 f/u ov/Wert re: med/hilar adenopathy Chief Complaint  Patient presents with  . Follow-up    Pt c/o discomort in center and rt side of chest since 05/22/13 after PET scan was done.   tenderness over skin reproduced by rubbing, improving since onset rec Lab Results  Component Value Date   ESRSEDRATE 18 06/19/2013   rec f/u 3 mo cxr    09/16/2013 f/u ov/Wert re: asymp hilar adenopathy  Chief Complaint  Patient presents with  . Follow-up    Pt c/o allergy symptoms- sinus congestion, stuffy nose, PND, cough.     No obvious day to day or daytime variabilty or assoc chronic cough or cp or chest tightness, subjective wheeze overt     hb symptoms. No unusual exp hx or h/o childhood pna/ asthma or knowledge of premature birth.  Sleeping ok without nocturnal  or early am exacerbation  of respiratory  c/o's or need for noct saba. Also denies any obvious fluctuation of symptoms with weather or environmental changes or other aggravating or alleviating factors except as outlined above   Current Medications, Allergies, Complete Past Medical History, Past Surgical History, Family History, and Social History were reviewed in [employer] record.  ROS  The following are not active complaints unless bolded sore throat, dysphagia, dental  problems, itching, sneezing,  nasal congestion or excess/ purulent secretions, ear ache,   fever, chills, sweats, ? unintended wt loss, pleuritic or exertional cp, hemoptysis,  orthopnea pnd or leg swelling, presyncope, palpitations, heartburn, abdominal pain, anorexia, nausea, vomiting, diarrhea  or change in bowel or urinary habits, change in stools or urine, dysuria,hematuria,  rash, arthralgias, visual complaints, headache, numbness weakness or ataxia or problems with walking or coordination,  change in mood/affect or memory.                      Objective:   Physical Exam  06/19/13            129 > 09/16/2013   130  Wt Readings from Last 3 Encounters:  05/12/13 134 lb 9.6 oz (61.054 kg)  05/01/13 134 lb (60.782 kg)  04/27/13 140 lb (63.504 kg)     amb hispanic female  nad    HEENT: nl dentition, turbinates, and orophanx. Nl external ear canals without cough reflex   NECK :  without JVD/Nodes/TM/ nl carotid upstrokes bilaterally   LUNGS: no acc muscle use, clear to A and P bilaterally without cough on insp or exp maneuvers - no superficial tenderness or rash over R ant chest    CV:  RRR  no s3 or murmur or increase in P2, no edema   ABD:  soft and nontender with nl excursion in the supine position. No bruits or  organomegaly, bowel sounds nl  MS:  warm without deformities, calf tenderness, cyanosis or clubbing  SKIN: warm and dry without lesions          CXR  09/16/2013 :  No active cardiopulmonary disease.          Assessment & Plan:

## 2013-09-16 NOTE — Patient Instructions (Signed)
For nasal symptoms as needed try zyrtec 10 mg at bedtime or clariton D.  I recommend repeat cxr in 6 months unless new cough/ chest pain/ unexplained wt loss / saturating sweats at night

## 2013-09-17 DIAGNOSIS — J310 Chronic rhinitis: Secondary | ICD-10-CM | POA: Insufficient documentation

## 2013-09-17 NOTE — Assessment & Plan Note (Signed)
Prosser control with otc's, reviewed

## 2013-09-17 NOTE — Assessment & Plan Note (Addendum)
-  See CT Chest 05/05/12  - PET 05/21/13 1. Hypermetabolic paratracheal, subcarinal, and right hilar adenopathy consistent with metastatic adenopathy. 2. Hypermetabolic focus adjacent to the right lobe of thyroid gland. This either represents a metastatic supraclavicular lymph node, metastasis to the right thyroid gland, or less likely prior primary thyroid carcinoma. Favor a metastatic lymph node. Ultrasound evaluation the neck the could delineate and serve as a mechanism of Biopsy > done 06/02/13 and neg for malignancy 3. Right lower lobe pulmonary nodule does not have metabolic activity but is below the size limit for accurate PET characterization. Recommend attention on follow-up. 4. Single hypermetabolic focus within the L5 vertebral body associated with a lucent lesion. Differential includes metabolic activity associated with a hemangioma, degenerative activity or solitary metastasis. Favor hemangioma - ESR 18 on 06/19/13   Remains asymptomatic, with no further wt loss or symptoms attributable to adenopathy which is not easily accessible to bx and with no evidence of med/hilar adenopathy on cxr.  Discussed in detail all the  indications, usual  risks and alternatives  relative to the benefits with patient who agrees to proceed with conservative f/u with cxr in 6 m, sooner if symptoms - See instructions for specific recommendations which were reviewed directly with the patient who was given a copy with highlighter outlining the key components.

## 2013-09-29 ENCOUNTER — Other Ambulatory Visit: Payer: Self-pay | Admitting: Internal Medicine

## 2013-11-11 ENCOUNTER — Other Ambulatory Visit: Payer: Self-pay | Admitting: Physician Assistant

## 2013-11-11 ENCOUNTER — Ambulatory Visit (INDEPENDENT_AMBULATORY_CARE_PROVIDER_SITE_OTHER): Payer: Medicare Other | Admitting: Internal Medicine

## 2013-11-11 ENCOUNTER — Encounter: Payer: Self-pay | Admitting: Internal Medicine

## 2013-11-11 VITALS — BP 108/70 | HR 95 | Temp 97.8°F | Resp 18 | Ht 62.0 in | Wt 128.0 lb

## 2013-11-11 DIAGNOSIS — I10 Essential (primary) hypertension: Secondary | ICD-10-CM

## 2013-11-11 DIAGNOSIS — K5732 Diverticulitis of large intestine without perforation or abscess without bleeding: Secondary | ICD-10-CM | POA: Diagnosis not present

## 2013-11-11 DIAGNOSIS — R590 Localized enlarged lymph nodes: Secondary | ICD-10-CM

## 2013-11-11 DIAGNOSIS — R599 Enlarged lymph nodes, unspecified: Secondary | ICD-10-CM | POA: Diagnosis not present

## 2013-11-11 DIAGNOSIS — R109 Unspecified abdominal pain: Secondary | ICD-10-CM

## 2013-11-11 MED ORDER — METRONIDAZOLE 500 MG PO TABS: 500.0000 mg | ORAL_TABLET | Freq: Three times a day (TID) | ORAL | Status: DC

## 2013-11-11 MED ORDER — AMLODIPINE BESYLATE 2.5 MG PO TABS: 2.5000 mg | ORAL_TABLET | Freq: Every day | ORAL | Status: DC

## 2013-11-11 MED ORDER — HYDROCODONE-ACETAMINOPHEN 10-325 MG PO TABS: 1.0000 | ORAL_TABLET | Freq: Three times a day (TID) | ORAL | Status: DC | PRN

## 2013-11-11 NOTE — Patient Instructions (Addendum)
Call or return to clinic prn if these symptoms worsen or fail to improve as anticipated.  Take your antibiotic as prescribed until ALL of it is gone, but stop if you develop a rash, swelling, or any side effects of the medication.  Contact our office as soon as possible if  there are side effects of the medication.   Diverticulitis Diverticulitis is inflammation or infection of small pouches in your colon that form when you have a condition called diverticulosis. The pouches in your colon are called diverticula. Your colon, or large intestine, is where water is absorbed and stool is formed. Complications of diverticulitis can include:  Bleeding.  Severe infection.  Severe pain.  Perforation of your colon.  Obstruction of your colon. CAUSES  Diverticulitis is caused by bacteria. Diverticulitis happens when stool becomes trapped in diverticula. This allows bacteria to grow in the diverticula, which can lead to inflammation and infection. RISK FACTORS People with diverticulosis are at risk for diverticulitis. Eating a diet that does not include enough fiber from fruits and vegetables may make diverticulitis more likely to develop. SYMPTOMS  Symptoms of diverticulitis may include:  Abdominal pain and tenderness. The pain is normally located on the left side of the abdomen, but may occur in other areas.  Fever and chills.  Bloating.  Cramping.  Nausea.  Vomiting.  Constipation.  Diarrhea.  Blood in your stool. DIAGNOSIS  Your health care provider will ask you about your medical history and do a physical exam. You may need to have tests done because many medical conditions can cause the same symptoms as diverticulitis. Tests may include:  Blood tests.  Urine tests.  Imaging tests of the abdomen, including X-rays and CT scans. When your condition is under control, your health care provider may recommend that you have a colonoscopy. A colonoscopy can show how severe your  diverticula are and whether something else is causing your symptoms. TREATMENT  Most cases of diverticulitis are mild and can be treated at home. Treatment may include:  Taking over-the-counter pain medicines.  Following a clear liquid diet.  Taking antibiotic medicines by mouth for 7-10 days. More severe cases may be treated at a hospital. Treatment may include:  Not eating or drinking.  Taking prescription pain medicine.  Receiving antibiotic medicines through an IV tube.  Receiving fluids and nutrition through an IV tube.  Surgery. HOME CARE INSTRUCTIONS   Follow your health care provider's instructions carefully.  Follow a full liquid diet or other diet as directed by your health care provider. After your symptoms improve, your health care provider may tell you to change your diet. He or she may recommend you eat a high-fiber diet. Fruits and vegetables are good sources of fiber. Fiber makes it easier to pass stool.  Take fiber supplements or probiotics as directed by your health care provider.  Only take medicines as directed by your health care provider.  Keep all your follow-up appointments. SEEK MEDICAL CARE IF:   Your pain does not improve.  You have a hard time eating food.  Your bowel movements do not return to normal. SEEK IMMEDIATE MEDICAL CARE IF:   Your pain becomes worse.  Your symptoms do not get better.  Your symptoms suddenly get worse.  You have a fever.  You have repeated vomiting.  You have bloody or black, tarry stools. MAKE SURE YOU:   Understand these instructions.  Will watch your condition.  Will get help right away if you are not doing  well or get worse. Document Released: 01/24/2005 Document Revised: 04/21/2013 Document Reviewed: 03/11/2013 [facility] Patient Information 2015 [place], [place]. This information is not intended to replace advice given to you by your health care provider. Make sure you discuss any questions you have  with your health care provider.

## 2013-11-11 NOTE — Progress Notes (Signed)
Pre visit review using our clinic review tool, if applicable. No additional management support is needed unless otherwise documented below in the visit note. 

## 2013-11-11 NOTE — Progress Notes (Signed)
Subjective:    Patient ID: Erica Young, female    DOB: Apr 13, 1941, 73 y.o.   MRN: [id number]  HPI  Wt Readings from Last 3 Encounters:  11/11/13 128 lb (58.06 kg)  09/16/13 130 lb (58.968 kg)  08/10/13 127 lb (57.15 kg)   73 year-old patient, who presents with a chief complaint of worsening abdominal pain.  She also complains some mild diarrhea, headaches, and sinus congestion.  She has a history of diverticulitis and she feels these symptoms are similar to episodes in the past.  She states that she is intolerant of Cipro and has done well with single antibiotic therapy with metronidazole in the past.  The patient had an abdominal CT scan in December of last year, which revealed a questionable small pulmonary nodule at the base of the left lung as well as right hilar adenopathy.  Followup chest CT revealed extensive mediastinal and hilar adenopathy.  In January of this year.  The patient underwent PET scanning that revealed hypermetabolic paratracheal, subcarinal, and right hilar adenopathy.  This was felt consistent with metastatic adenopathy.  The patient also had a hypermetabolic focus near the right lobe of the thyroid gland.  This area was biopsied with benign pathology.  The patient clinically has done quite well and followup chest x-rays in February and May revealed no active pulmonary disease.  The patient has been evaluated by pulmonary medicine and conservative therapy and close clinical followup has been decided.  The patient has no pulmonary complaints and her weight has been stable  Patient does have a prior history of right breast cancer treated with lumpectomy and RT only  Past Medical History  Diagnosis Date  . HYPOTHYROIDISM 03/03/2010  . HYPERLIPIDEMIA 03/03/2010  . CEREBROVASCULAR DISEASE 03/03/2010  . DIVERTICULOSIS, COLON 03/03/2010  . Low back pain   . Right leg pain   . History of cerebral artery stenosis     right middle  . Mastoiditis     noted on brain MRI  .  Foramen ovale     positive bubble study  . Diverticulitis   . Fatty liver 08/02/09    as per U/S done by [facility]  . Internal hemorrhoid   . Stroke   . Anxiety   . Hypertension   . Ulcer   . Cancer     right breat ca  , lumpectomy and radiation tx (declined chemo and additional prophylactic meds)    History   Social History  . Marital Status: Married    Spouse Name: N/A    Number of Children: 2  . Years of Education: N/A   Occupational History  . Social Worker--Retired    Social History Main Topics  . Smoking status: Former Smoker -- 0.50 packs/day for 20 years    Types: Cigarettes    Quit date: 05/01/1991  . Smokeless tobacco: Never Used  . Alcohol Use: No  . Drug Use: No  . Sexual Activity: Yes   Other Topics Concern  . Not on file   Social History Narrative   Daily caffeine     Past Surgical History  Procedure Laterality Date  . Cholecystectomy    . Breast lumpectomy      right  . Tonsillectomy      Family History  Problem Relation Age of Onset  . Heart disease Sister     cerebral vascular disease also  . Heart disease Brother     cerebral vascular disease also  . Heart disease Brother  cerebral vascular disease  . Heart disease Sister     cebreal vascular disease also  . Heart disease Sister     cebreal vascular diease also  . Heart disease Sister     cerebral vascular diease also  . Colon cancer Neg Hx   . Esophageal cancer Neg Hx   . Rectal cancer Neg Hx   . Stomach cancer Father     Allergies  Allergen Reactions  . Ciprofloxacin Anaphylaxis    Current Outpatient Prescriptions on File Prior to Visit  Medication Sig Dispense Refill  . ALPRAZolam (XANAX) 0.25 MG tablet TAKE 1 TABLET BY MOUTH EVERY 8 HOURS AS NEEDED  90 tablet  2  . aspirin 81 MG tablet Take 81 mg by mouth daily.      Marland Kitchen dicyclomine (BENTYL) 10 MG capsule Take 10 mg by mouth as needed.       . simvastatin (ZOCOR) 5 MG tablet Take 1 tablet (5 mg total) by  mouth daily.  90 tablet  6   No current facility-administered medications on file prior to visit.    BP 108/70  Pulse 95  Temp(Src) 97.8 F (36.6 C) (Oral)  Resp 18  Ht 5\' 2"  (1.575 m)  Wt 128 lb (58.06 kg)  BMI 23.41 kg/m2  SpO2 98%     Review of Systems  Constitutional: Positive for activity change and appetite change.  HENT: Positive for congestion. Negative for dental problem, hearing loss, rhinorrhea, sinus pressure, sore throat and tinnitus.   Eyes: Negative for pain, discharge and visual disturbance.  Respiratory: Negative for cough and shortness of breath.   Cardiovascular: Negative for chest pain, palpitations and leg swelling.  Gastrointestinal: Positive for abdominal pain and diarrhea. Negative for nausea, vomiting, constipation, blood in stool and abdominal distention.  Genitourinary: Negative for dysuria, urgency, frequency, hematuria, flank pain, vaginal bleeding, vaginal discharge, difficulty urinating, vaginal pain and pelvic pain.  Musculoskeletal: Negative for arthralgias, gait problem and joint swelling.  Skin: Negative for rash.  Neurological: Positive for headaches. Negative for dizziness, syncope, speech difficulty, weakness and numbness.  Hematological: Negative for adenopathy.  Psychiatric/Behavioral: Negative for behavioral problems, dysphoric mood and agitation. The patient is not nervous/anxious.        Objective:   Physical Exam  Constitutional: She is oriented to person, place, and time. She appears well-developed and well-nourished.  HENT:  Head: Normocephalic.  Right Ear: External ear normal.  Left Ear: External ear normal.  Mouth/Throat: Oropharynx is clear and moist.  Eyes: Conjunctivae and EOM are normal. Pupils are equal, round, and reactive to light.  Neck: Normal range of motion. Neck supple. No thyromegaly present.  Cardiovascular: Normal rate, regular rhythm, normal heart sounds and intact distal pulses.   Pulmonary/Chest: Effort  normal and breath sounds normal.  Abdominal: Soft. Bowel sounds are normal. She exhibits no mass. There is no tenderness.  Bowel sounds were diminished, but present No guarding, or peritoneal signs noted Patient was tender in the left lower quadrant and lower mid abdominal area  Musculoskeletal: Normal range of motion.  Lymphadenopathy:    She has no cervical adenopathy.  Neurological: She is alert and oriented to person, place, and time.  Skin: Skin is warm and dry. No rash noted.  Psychiatric: She has a normal mood and affect. Her behavior is normal.          Assessment & Plan:   Recurrent acute diverticulitis.  The patient has responded to single antibiotic therapy in the, past.  We'll treat  with 10 days of metronidazole.  We'll follow closely clinically  History of abnormal PET scan consistent with metastatic adenopathy.  Follow chest x-ray is normal and the patient appears clinically stable.  We'll continue to follow clinically.  Patient apparently has declined FOB

## 2013-11-16 ENCOUNTER — Telehealth: Payer: Self-pay | Admitting: Internal Medicine

## 2013-11-16 DIAGNOSIS — R109 Unspecified abdominal pain: Secondary | ICD-10-CM

## 2013-11-16 NOTE — Telephone Encounter (Signed)
Add augmentin 875  #14 one BID schedule  Abdominaland/pelvic CT scan

## 2013-11-16 NOTE — Telephone Encounter (Signed)
Dr. Raliegh Ip talked to pt she is feeling better will continue present antibiotic and cancel CT scan for now. Pt to call if symptoms worsen.

## 2013-11-16 NOTE — Telephone Encounter (Signed)
Patient Information:  Caller Name: [name]  Phone: [phone]  Patient: Erica Young  Gender: Female  DOB: May 01, 1940  Age: 73 Years  PCP: Bluford Kaufmann (Family Practice > 82yrs old)  Office Follow Up:  Does the office need to follow up with this patient?: Yes  Instructions For The Office: Please call and advise what is next step for this pt who has not responded to prescribed treatment   Symptoms  Reason For Call & Symptoms: Pts spouse is calling to say the pt was into the office on 11/11/13 and dx with diverticulitis. She was prescribed Flagyl 500mg  po TID x 10 days. Pt was prescribed Norco but she is not taking it. (she does not like to take narcotics0 Pt has had no improvement. Her main c/o is ongoing abdominal cramping. No vomit/no diarrhea (this cleared Up 3-4 days ago). No fever. Pt is also taking Dicyclomine 10mg  po TID  Reviewed Health History In EMR: Yes  Reviewed Medications In EMR: Yes  Reviewed Allergies In EMR: Yes  Reviewed Surgeries / Procedures: Yes  Date of Onset of Symptoms: 11/11/2013  Guideline(s) Used:  No Protocol Available - Sick Adult  Disposition Per Guideline:   Discuss with PCP and Callback by Nurse Today  Reason For Disposition Reached:   Nursing judgment  Advice Given:  N/A  Patient Will Follow Care Advice:  YES

## 2013-11-18 ENCOUNTER — Encounter (HOSPITAL_COMMUNITY): Payer: Self-pay | Admitting: Emergency Medicine

## 2013-11-18 ENCOUNTER — Emergency Department (HOSPITAL_COMMUNITY): Payer: Medicare Other

## 2013-11-18 ENCOUNTER — Emergency Department (HOSPITAL_COMMUNITY)
Admission: EM | Admit: 2013-11-18 | Discharge: 2013-11-18 | Disposition: A | Discharge status: Home / Self Care | Payer: Medicare Other | Attending: Emergency Medicine | Admitting: Emergency Medicine

## 2013-11-18 DIAGNOSIS — Z872 Personal history of diseases of the skin and subcutaneous tissue: Secondary | ICD-10-CM | POA: Diagnosis not present

## 2013-11-18 DIAGNOSIS — K5900 Constipation, unspecified: Secondary | ICD-10-CM | POA: Insufficient documentation

## 2013-11-18 DIAGNOSIS — Z8673 Personal history of transient ischemic attack (TIA), and cerebral infarction without residual deficits: Secondary | ICD-10-CM | POA: Insufficient documentation

## 2013-11-18 DIAGNOSIS — Q211 Atrial septal defect: Secondary | ICD-10-CM | POA: Insufficient documentation

## 2013-11-18 DIAGNOSIS — R197 Diarrhea, unspecified: Secondary | ICD-10-CM | POA: Diagnosis not present

## 2013-11-18 DIAGNOSIS — I10 Essential (primary) hypertension: Secondary | ICD-10-CM | POA: Insufficient documentation

## 2013-11-18 DIAGNOSIS — Z9089 Acquired absence of other organs: Secondary | ICD-10-CM | POA: Insufficient documentation

## 2013-11-18 DIAGNOSIS — Q2111 Secundum atrial septal defect: Secondary | ICD-10-CM | POA: Diagnosis not present

## 2013-11-18 DIAGNOSIS — Z8669 Personal history of other diseases of the nervous system and sense organs: Secondary | ICD-10-CM | POA: Diagnosis not present

## 2013-11-18 DIAGNOSIS — Z853 Personal history of malignant neoplasm of breast: Secondary | ICD-10-CM | POA: Insufficient documentation

## 2013-11-18 DIAGNOSIS — K5730 Diverticulosis of large intestine without perforation or abscess without bleeding: Secondary | ICD-10-CM | POA: Diagnosis not present

## 2013-11-18 DIAGNOSIS — Z7982 Long term (current) use of aspirin: Secondary | ICD-10-CM | POA: Diagnosis not present

## 2013-11-18 DIAGNOSIS — Z79899 Other long term (current) drug therapy: Secondary | ICD-10-CM | POA: Insufficient documentation

## 2013-11-18 DIAGNOSIS — R109 Unspecified abdominal pain: Secondary | ICD-10-CM | POA: Diagnosis not present

## 2013-11-18 DIAGNOSIS — Z87891 Personal history of nicotine dependence: Secondary | ICD-10-CM | POA: Insufficient documentation

## 2013-11-18 DIAGNOSIS — E785 Hyperlipidemia, unspecified: Secondary | ICD-10-CM | POA: Diagnosis not present

## 2013-11-18 DIAGNOSIS — K921 Melena: Secondary | ICD-10-CM | POA: Insufficient documentation

## 2013-11-18 DIAGNOSIS — R1084 Generalized abdominal pain: Secondary | ICD-10-CM | POA: Insufficient documentation

## 2013-11-18 LAB — CBC WITH DIFFERENTIAL/PLATELET
BASOS ABS: 0 10*3/uL (ref 0.0–0.1)
BASOS PCT: 0 % (ref 0–1)
EOS PCT: 2 % (ref 0–5)
Eosinophils Absolute: 0.1 10*3/uL (ref 0.0–0.7)
HCT: 38.7 % (ref 36.0–46.0)
Hemoglobin: 13.3 g/dL (ref 12.0–15.0)
Lymphocytes Relative: 22 % (ref 12–46)
Lymphs Abs: 1.6 10*3/uL (ref 0.7–4.0)
MCH: 32.4 pg (ref 26.0–34.0)
MCHC: 34.4 g/dL (ref 30.0–36.0)
MCV: 94.4 fL (ref 78.0–100.0)
Monocytes Absolute: 0.4 10*3/uL (ref 0.1–1.0)
Monocytes Relative: 6 % (ref 3–12)
NEUTROS ABS: 5.1 10*3/uL (ref 1.7–7.7)
Neutrophils Relative %: 70 % (ref 43–77)
PLATELETS: 205 10*3/uL (ref 150–400)
RBC: 4.1 MIL/uL (ref 3.87–5.11)
RDW: 12.6 % (ref 11.5–15.5)
WBC: 7.2 10*3/uL (ref 4.0–10.5)

## 2013-11-18 LAB — COMPREHENSIVE METABOLIC PANEL
ALBUMIN: 3.5 g/dL (ref 3.5–5.2)
ALT: 14 U/L (ref 0–35)
ANION GAP: 15 (ref 5–15)
AST: 15 U/L (ref 0–37)
Alkaline Phosphatase: 78 U/L (ref 39–117)
BUN: 9 mg/dL (ref 6–23)
CO2: 23 mEq/L (ref 19–32)
Calcium: 8.9 mg/dL (ref 8.4–10.5)
Chloride: 97 mEq/L (ref 96–112)
Creatinine, Ser: 0.55 mg/dL (ref 0.50–1.10)
GFR calc Af Amer: >90 mL/min (ref >90)
GFR calc non Af Amer: >90 mL/min (ref >90)
Glucose, Bld: 83 mg/dL (ref 70–99)
Potassium: 4.1 mEq/L (ref 3.7–5.3)
Sodium: 135 mEq/L — ABNORMAL LOW (ref 137–147)
TOTAL PROTEIN: 7.3 g/dL (ref 6.0–8.3)
Total Bilirubin: 0.3 mg/dL (ref 0.3–1.2)

## 2013-11-18 LAB — URINALYSIS, ROUTINE W REFLEX MICROSCOPIC
Bilirubin Urine: NEGATIVE
Glucose, UA: NEGATIVE mg/dL
Hgb urine dipstick: NEGATIVE
Ketones, ur: NEGATIVE mg/dL
NITRITE: NEGATIVE
Protein, ur: NEGATIVE mg/dL
SPECIFIC GRAVITY, URINE: 1.008 (ref 1.005–1.030)
Urobilinogen, UA: 0.2 mg/dL (ref 0.0–1.0)
pH: 7.5 (ref 5.0–8.0)

## 2013-11-18 LAB — URINE MICROSCOPIC-ADD ON

## 2013-11-18 LAB — POC OCCULT BLOOD, ED: Fecal Occult Bld: POSITIVE — AB

## 2013-11-18 MED ORDER — MAGNESIUM CITRATE PO SOLN: 1.0000 | Freq: Once | ORAL | Status: DC

## 2013-11-18 MED ORDER — DOCUSATE SODIUM 100 MG PO CAPS: 100.0000 mg | ORAL_CAPSULE | Freq: Two times a day (BID) | ORAL | Status: DC

## 2013-11-18 MED ORDER — SODIUM CHLORIDE 0.9 % IV SOLN
Freq: Once | INTRAVENOUS | Status: AC
  Administered 2013-11-18: 14:00:00 via INTRAVENOUS

## 2013-11-18 MED ORDER — IOHEXOL 300 MG/ML  SOLN
25.0000 mL | INTRAMUSCULAR | Status: AC | PRN
  Administered 2013-11-18 (×2): 25 mL via ORAL

## 2013-11-18 MED ORDER — KETOROLAC TROMETHAMINE 15 MG/ML IJ SOLN
15.0000 mg | Freq: Once | INTRAMUSCULAR | Status: AC
  Administered 2013-11-18: 15 mg via INTRAVENOUS
  Filled 2013-11-18: qty 1

## 2013-11-18 MED ORDER — IOHEXOL 300 MG/ML  SOLN
80.0000 mL | Freq: Once | INTRAMUSCULAR | Status: AC | PRN
  Administered 2013-11-18: 80 mL via INTRAVENOUS

## 2013-11-18 MED ORDER — LACTULOSE 10 GM/15ML PO SOLN: 10.0000 g | Freq: Two times a day (BID) | ORAL | Status: DC | PRN

## 2013-11-18 NOTE — ED Notes (Signed)
The patient has had diverticulitis for ten years.  She started having abdominal pain for two weeks she went to the doctor wednesday and they gave her Antibiotics.  The patient said she is not getting better she is getting worse.  The patient has had diarrhea and thinks she saw a little bit of blood.  She denies nausea and vomiting.

## 2013-11-18 NOTE — ED Notes (Signed)
Unable to obtain IV access X 2.

## 2013-11-18 NOTE — Discharge Instructions (Signed)
Abdominal Pain, Women °Abdominal (stomach, pelvic, or belly) pain can be caused by many things. It is important to tell your doctor: °· The location of the pain. °· Does it come and go or is it present all the time? °· Are there things that start the pain (eating certain foods, exercise)? °· Are there other symptoms associated with the pain (fever, nausea, vomiting, diarrhea)? °All of this is helpful to know when trying to find the cause of the pain. °CAUSES  °· Stomach: virus or bacteria infection, or ulcer. °· Intestine: appendicitis (inflamed appendix), regional ileitis (Crohn's disease), ulcerative colitis (inflamed colon), irritable bowel syndrome, diverticulitis (inflamed diverticulum of the colon), or cancer of the stomach or intestine. °· Gallbladder disease or stones in the gallbladder. °· Kidney disease, kidney stones, or infection. °· Pancreas infection or cancer. °· Fibromyalgia (pain disorder). °· Diseases of the female organs: °¨ Uterus: fibroid (non-cancerous) tumors or infection. °¨ Fallopian tubes: infection or tubal pregnancy. °¨ Ovary: cysts or tumors. °¨ Pelvic adhesions (scar tissue). °¨ Endometriosis (uterus lining tissue growing in the pelvis and on the pelvic organs). °¨ Pelvic congestion syndrome (female organs filling up with blood just before the menstrual period). °¨ Pain with the menstrual period. °¨ Pain with ovulation (producing an egg). °¨ Pain with an IUD (intrauterine device, birth control) in the uterus. °¨ Cancer of the female organs. °· Functional pain (pain not caused by a disease, may improve without treatment). °· Psychological pain. °· Depression. °DIAGNOSIS  °Your doctor will decide the seriousness of your pain by doing an examination. °· Blood tests. °· X-rays. °· Ultrasound. °· CT scan (computed tomography, special type of X-ray). °· MRI (magnetic resonance imaging). °· Cultures, for infection. °· Barium enema (dye inserted in the large intestine, to better view it with  X-rays). °· Colonoscopy (looking in intestine with a lighted tube). °· Laparoscopy (minor surgery, looking in abdomen with a lighted tube). °· Major abdominal exploratory surgery (looking in abdomen with a large incision). °TREATMENT  °The treatment will depend on the cause of the pain.  °· Many cases can be observed and treated at home. °· Over-the-counter medicines recommended by your caregiver. °· Prescription medicine. °· Antibiotics, for infection. °· Birth control pills, for painful periods or for ovulation pain. °· Hormone treatment, for endometriosis. °· Nerve blocking injections. °· Physical therapy. °· Antidepressants. °· Counseling with a psychologist or psychiatrist. °· Minor or major surgery. °HOME CARE INSTRUCTIONS  °· Do not take laxatives, unless directed by your caregiver. °· Take over-the-counter pain medicine only if ordered by your caregiver. Do not take aspirin because it can cause an upset stomach or bleeding. °· Try a clear liquid diet (broth or water) as ordered by your caregiver. Slowly move to a bland diet, as tolerated, if the pain is related to the stomach or intestine. °· Have a thermometer and take your temperature several times a day, and record it. °· Bed rest and sleep, if it helps the pain. °· Avoid sexual intercourse, if it causes pain. °· Avoid stressful situations. °· Keep your follow-up appointments and tests, as your caregiver orders. °· If the pain does not go away with medicine or surgery, you may try: °¨ Acupuncture. °¨ Relaxation exercises (yoga, meditation). °¨ Group therapy. °¨ Counseling. °SEEK MEDICAL CARE IF:  °· You notice certain foods cause stomach pain. °· Your home care treatment is not helping your pain. °· You need stronger pain medicine. °· You want your IUD removed. °· You feel faint or   lightheaded.  You develop nausea and vomiting.  You develop a rash.  You are having side effects or an allergy to your medicine. SEEK IMMEDIATE MEDICAL CARE IF:   Your  pain does not go away or gets worse.  You have a fever.  Your pain is felt only in portions of the abdomen. The right side could possibly be appendicitis. The left lower portion of the abdomen could be colitis or diverticulitis.  You are passing blood in your stools (bright red or black tarry stools, with or without vomiting).  You have blood in your urine.  You develop chills, with or without a fever.  You pass out. MAKE SURE YOU:   Understand these instructions.  Will watch your condition.  Will get help right away if you are not doing well or get worse. Document Released: 02/11/2007 Document Revised: 07/09/2011 Document Reviewed: 03/03/2009 [facility] Patient Information 2015 [place], [place]. This information is not intended to replace advice given to you by your health care provider. Make sure you discuss any questions you have with your health care provider.  Constipation Constipation is when a person has fewer than three bowel movements a week, has difficulty having a bowel movement, or has stools that are dry, hard, or larger than normal. As people grow older, constipation is more common. If you try to fix constipation with medicines that make you have a bowel movement (laxatives), the problem may get worse. Long-term laxative use may cause the muscles of the colon to become weak. A low-fiber diet, not taking in enough fluids, and taking certain medicines may make constipation worse.  CAUSES   Certain medicines, such as antidepressants, pain medicine, iron supplements, antacids, and water pills.   Certain diseases, such as diabetes, irritable bowel syndrome (IBS), thyroid disease, or depression.   Not drinking enough water.   Not eating enough fiber-rich foods.   Stress or travel.   Lack of physical activity or exercise.   Ignoring the urge to have a bowel movement.   Using laxatives too much.  SIGNS AND SYMPTOMS   Having fewer than three bowel movements a  week.   Straining to have a bowel movement.   Having stools that are hard, dry, or larger than normal.   Feeling full or bloated.   Pain in the lower abdomen.   Not feeling relief after having a bowel movement.  DIAGNOSIS  Your health care provider will take a medical history and perform a physical exam. Further testing may be done for severe constipation. Some tests may include:  A barium enema X-ray to examine your rectum, colon, and, sometimes, your small intestine.   A sigmoidoscopy to examine your lower colon.   A colonoscopy to examine your entire colon. TREATMENT  Treatment will depend on the severity of your constipation and what is causing it. Some dietary treatments include drinking more fluids and eating more fiber-rich foods. Lifestyle treatments may include regular exercise. If these diet and lifestyle recommendations do not help, your health care provider may recommend taking over-the-counter laxative medicines to help you have bowel movements. Prescription medicines may be prescribed if over-the-counter medicines do not work.  HOME CARE INSTRUCTIONS   Eat foods that have a lot of fiber, such as fruits, vegetables, whole grains, and beans.  Limit foods high in fat and processed sugars, such as french fries, hamburgers, cookies, candies, and soda.   A fiber supplement may be added to your diet if you cannot get enough fiber from foods.  Drink enough fluids to keep your urine clear or pale yellow.   °· Exercise regularly or as directed by your health care provider.   °· Go to the restroom when you have the urge to go. Do not hold it.   °· Only take over-the-counter or prescription medicines as directed by your health care provider. Do not take other medicines for constipation without talking to your health care provider first.   °SEEK IMMEDIATE MEDICAL CARE IF:  °· You have bright red blood in your stool.   °· Your constipation lasts for more than 4 days or gets  worse.   °· You have abdominal or rectal pain.   °· You have thin, pencil-like stools.   °· You have unexplained weight loss. °MAKE SURE YOU:  °· Understand these instructions. °· Will watch your condition. °· Will get help right away if you are not doing well or get worse. °Document Released: 01/13/2004 Document Revised: 04/21/2013 Document Reviewed: 01/26/2013 °[employer]® Patient Information ©2015 [employer]. This information is not intended to replace advice given to you by your health care provider. Make sure you discuss any questions you have with your health care provider. ° °

## 2013-11-18 NOTE — ED Notes (Signed)
CT called, states pt IV infiltrated, was removed by CT and ice pack applied.

## 2013-11-18 NOTE — ED Notes (Signed)
Attempted IV without success. IV team notified.

## 2013-11-18 NOTE — ED Notes (Signed)
No increase in redness or swelling to left arm post CT infiltration. No blisters or ulcers noted.

## 2013-11-18 NOTE — ED Notes (Signed)
R arm restriction band placed to r wrist.

## 2013-11-18 NOTE — ED Provider Notes (Addendum)
CSN: [id number]     Arrival date & time 11/18/13  1124 History   First MD Initiated Contact with Patient 11/18/13 1139     Chief Complaint  Patient presents with  . Diverticulitis    The patient has had diverticulitis for ten years.  She started having abdominal pain for two weeks she went to the doctor wednesday and they gave her Antibiotics.  The patient said she is not getting better she is getting worse.     (Consider location/radiation/quality/duration/timing/severity/associated sxs/prior Treatment) HPI Comments: Patient referred to the emergency department by primary care physician for evaluation of lower abdominal pain. Patient reports that symptoms began 2 weeks ago. Pain is across the lower abdomen and pelvis and bruising to her lower back. Symptoms began 2 weeks ago and have progressively worsened. Patient was seen by her primary-care doctor one week ago and was started on Flagyl. She has not had any improvement. Patient denies nausea and vomiting. No fever. She has had diarrhea which has recently resolved. She thinks she saw small amount of blood in her stool yesterday.   Past Medical History  Diagnosis Date  . HYPOTHYROIDISM 03/03/2010  . HYPERLIPIDEMIA 03/03/2010  . CEREBROVASCULAR DISEASE 03/03/2010  . DIVERTICULOSIS, COLON 03/03/2010  . Low back pain   . Right leg pain   . History of cerebral artery stenosis     right middle  . Mastoiditis     noted on brain MRI  . Foramen ovale     positive bubble study  . Diverticulitis   . Fatty liver 08/02/09    as per U/S done by [facility]  . Internal hemorrhoid   . Stroke   . Anxiety   . Hypertension   . Ulcer   . Cancer     right breat ca  , lumpectomy and radiation tx (declined chemo and additional prophylactic meds)   Past Surgical History  Procedure Laterality Date  . Cholecystectomy    . Breast lumpectomy      right  . Tonsillectomy     Family History  Problem Relation Age of Onset  . Heart disease  Sister     cerebral vascular disease also  . Heart disease Brother     cerebral vascular disease also  . Heart disease Brother     cerebral vascular disease  . Heart disease Sister     cebreal vascular disease also  . Heart disease Sister     cebreal vascular diease also  . Heart disease Sister     cerebral vascular diease also  . Colon cancer Neg Hx   . Esophageal cancer Neg Hx   . Rectal cancer Neg Hx   . Stomach cancer Father    History  Substance Use Topics  . Smoking status: Former Smoker -- 0.50 packs/day for 20 years    Types: Cigarettes    Quit date: 05/01/1991  . Smokeless tobacco: Never Used  . Alcohol Use: No   OB History   Grav Para Term Preterm Abortions TAB SAB Ect Mult Living   4 2             Review of Systems  Gastrointestinal: Positive for abdominal pain, diarrhea and blood in stool.  All other systems reviewed and are negative.     Allergies  Ciprofloxacin  Home Medications   Prior to Admission medications   Medication Sig Start Date End Date Taking? Authorizing Provider  ALPRAZolam Duanne Moron) 0.25 MG tablet Take 0.25 mg by mouth every  8 (eight) hours as needed for anxiety.   Yes Historical Provider, MD  amLODipine (NORVASC) 2.5 MG tablet Take 1 tablet (2.5 mg total) by mouth daily. 11/11/13  Yes Marletta Lor, MD  aspirin 81 MG tablet Take 81 mg by mouth daily.   Yes Historical Provider, MD  dicyclomine (BENTYL) 10 MG capsule Take 10 mg by mouth 3 (three) times daily as needed for spasms.  07/24/13  Yes Historical Provider, MD  metroNIDAZOLE (FLAGYL) 500 MG tablet Take 1 tablet (500 mg total) by mouth 3 (three) times daily. 11/11/13  Yes Marletta Lor, MD  Multiple Vitamin (MULTIVITAMIN WITH MINERALS) TABS tablet Take 1 tablet by mouth daily.   Yes Historical Provider, MD  simvastatin (ZOCOR) 5 MG tablet Take 1 tablet (5 mg total) by mouth daily. 08/10/13  Yes Marletta Lor, MD   BP 126/61  Pulse 83  Temp(Src) 98 F (36.7 C) (Oral)   Resp 22  SpO2 97% Physical Exam  Constitutional: She is oriented to person, place, and time. She appears well-developed and well-nourished. No distress.  HENT:  Head: Normocephalic and atraumatic.  Right Ear: Hearing normal.  Left Ear: Hearing normal.  Nose: Nose normal.  Mouth/Throat: Oropharynx is clear and moist and mucous membranes are normal.  Eyes: Conjunctivae and EOM are normal. Pupils are equal, round, and reactive to light.  Neck: Normal range of motion. Neck supple.  Cardiovascular: Regular rhythm, S1 normal and S2 normal.  Exam reveals no gallop and no friction rub.   No murmur heard. Pulmonary/Chest: Effort normal and breath sounds normal. No respiratory distress. She exhibits no tenderness.  Abdominal: Soft. Normal appearance and bowel sounds are normal. There is no hepatosplenomegaly. There is tenderness in the right lower quadrant, suprapubic area and left lower quadrant. There is no rebound, no guarding, no tenderness at McBurney's point and negative Murphy's sign. No hernia.  Musculoskeletal: Normal range of motion.  Neurological: She is alert and oriented to person, place, and time. She has normal strength. No cranial nerve deficit or sensory deficit. Coordination normal. GCS eye subscore is 4. GCS verbal subscore is 5. GCS motor subscore is 6.  Skin: Skin is warm, dry and intact. No rash noted. No cyanosis.  Psychiatric: She has a normal mood and affect. Her speech is normal and behavior is normal. Thought content normal.    ED Course  Procedures (including critical care time) Labs Review Labs Reviewed  COMPREHENSIVE METABOLIC PANEL - Abnormal; Notable for the following:    Sodium 135 (*)    All other components within normal limits  URINALYSIS, ROUTINE W REFLEX MICROSCOPIC - Abnormal; Notable for the following:    Leukocytes, UA TRACE (*)    All other components within normal limits  CBC WITH DIFFERENTIAL  URINE MICROSCOPIC-ADD ON  POC OCCULT BLOOD, ED     Imaging Review Ct Abdomen Pelvis W Contrast  11/18/2013   CLINICAL DATA:  Abdominal pain.  EXAM: CT ABDOMEN AND PELVIS WITH CONTRAST  TECHNIQUE: Multidetector CT imaging of the abdomen and pelvis was performed using the standard protocol following bolus administration of intravenous contrast.  CONTRAST:  61mL OMNIPAQUE IOHEXOL 300 MG/ML  SOLN  COMPARISON:  Chest CT 05/05/2013.  PET CT 05/21/2013.  FINDINGS: Subcarinal adenopathy partially imaged, similar to prior chest CT. Left lower lobe pulmonary nodule measures 6 mm, stable. Subpleural nodularity in both lower lobes (image 13 on the left and images 11-12 on the right has increased since prior study. No pleural effusions. Increasing right  base atelectasis.  There is a new small pericardial effusion.  Prior cholecystectomy. Liver, spleen, pancreas, adrenals and left kidney are unremarkable. Multiple benign-appearing cysts in the midpole of the right kidney. No hydronephrosis.  Large stool burden throughout the colon. Descending colonic and sigmoid diverticulosis. No active diverticulitis. Stomach and small bowel are decompressed. Aorta is non aneurysmal. No free fluid, free air or adenopathy. Uterus and adnexa grossly unremarkable.  Subtle lucency in the posterior aspect of the L5 vertebral body again noted as seen on prior PET CT. Favor hemangioma. This is unchanged. Mild degenerative changes in the lumbar spine. No acute bony abnormality.  IMPRESSION: Subcarinal adenopathy, partially imaged. Visualized portions of the adenopathy stable.  New small pericardial effusion.  Mild subpleural nodularity in the lower lobes bilaterally, increased since prior study. One of these nodules in the right lower lobe with seen on prior study and is stable. Given the bilateral dependent nature of this, I favor this represents subpleural atelectasis. Recommend attention on followup imaging.  Stable left lower lobe 6 mm nodule.  Left colonic diverticulosis. No active  diverticulitis. Large stool burden throughout the colon.   Electronically Signed   By: Rolm Baptise M.D.   On: 11/18/2013 15:47     EKG Interpretation None      MDM   Final diagnoses:  None   abdominal pain  Constipation  She presented to the ER for evaluation of 2 weeks of lower abdominal and pelvic area pain. She was initiated on treatment for diverticulitis one week ago. She was started on Flagyl. She reports that initially it was diarrhea, but since starting the Flagyl that has stopped. She had diffuse lower abdominal tenderness without guarding or rebound. Blood work was normal. CT scan was performed to further evaluate. No evidence of acute diverticulitis seen. No acute pathology seen. Patient has multiple lymph nodes which need to be followed up by primary care doctor. The only finding on the CAT scan was a large stool burden, likely the cause the patient's pain. Rectal exam did not show any evidence of impaction. Patient will be discharged, bowel regimen and followup with primary doctor.  Addendum: Patient's IV extravasated in radiology. There was concern for extravasation of the ibutilide. Patient had the arm elevated and iced. She was monitored here in the ER post procedure. There is very slight bruising around the site, but no overlying skin changes. No erythema, blistering or skin breakdown. Swelling is improving significantly. Patient will continue to ice it and elevate it at home. She was counseled to return immediately for any significant swelling, pain or skin changes.  Orpah Greek, MD 11/18/13 [place], MD 11/18/13 [phone]

## 2013-11-18 NOTE — ED Notes (Signed)
Pt L arm elevated with ice pack applied in CT

## 2013-12-14 ENCOUNTER — Encounter: Payer: Self-pay | Admitting: Internal Medicine

## 2013-12-14 ENCOUNTER — Ambulatory Visit (INDEPENDENT_AMBULATORY_CARE_PROVIDER_SITE_OTHER): Payer: Medicare Other | Admitting: Internal Medicine

## 2013-12-14 ENCOUNTER — Ambulatory Visit (INDEPENDENT_AMBULATORY_CARE_PROVIDER_SITE_OTHER)
Admission: RE | Admit: 2013-12-14 | Discharge: 2013-12-14 | Disposition: A | Discharge status: Home / Self Care | Payer: Medicare Other | Source: Ambulatory Visit | Attending: Internal Medicine | Admitting: Internal Medicine

## 2013-12-14 VITALS — BP 120/72 | HR 94 | Temp 97.6°F | Resp 20 | Ht 62.0 in | Wt 127.0 lb

## 2013-12-14 DIAGNOSIS — R590 Localized enlarged lymph nodes: Secondary | ICD-10-CM

## 2013-12-14 DIAGNOSIS — E785 Hyperlipidemia, unspecified: Secondary | ICD-10-CM | POA: Diagnosis not present

## 2013-12-14 DIAGNOSIS — R599 Enlarged lymph nodes, unspecified: Secondary | ICD-10-CM

## 2013-12-14 DIAGNOSIS — E039 Hypothyroidism, unspecified: Secondary | ICD-10-CM

## 2013-12-14 DIAGNOSIS — I10 Essential (primary) hypertension: Secondary | ICD-10-CM | POA: Diagnosis not present

## 2013-12-14 DIAGNOSIS — R0602 Shortness of breath: Secondary | ICD-10-CM | POA: Diagnosis not present

## 2013-12-14 DIAGNOSIS — J310 Chronic rhinitis: Secondary | ICD-10-CM

## 2013-12-14 LAB — SEDIMENTATION RATE: Sed Rate: 27 mm/hr — ABNORMAL HIGH (ref 0–22)

## 2013-12-14 MED ORDER — FLUTICASONE PROPIONATE 50 MCG/ACT NA SUSP: 2.0000 | Freq: Every day | NASAL | Status: DC

## 2013-12-14 NOTE — Progress Notes (Signed)
Pre visit review using our clinic review tool, if applicable. No additional management support is needed unless otherwise documented below in the visit note. 

## 2013-12-14 NOTE — Progress Notes (Signed)
Subjective:    Patient ID: Erica Young, female    DOB: Oct 31, 1940, 73 y.o.   MRN: [id number]  HPI   The patient had an abdominal CT scan in December of last year, which revealed a questionable small pulmonary nodule at the base of the left lung as well as right hilar adenopathy. Followup chest CT revealed extensive mediastinal and hilar adenopathy. In January of this year. The patient underwent PET scanning that revealed hypermetabolic paratracheal, subcarinal, and right hilar adenopathy. This was felt consistent with metastatic adenopathy. The patient also had a hypermetabolic focus near the right lobe of the thyroid gland. This area was biopsied with benign pathology; followup chest x-rays in February and May revealed no active pulmonary disease. The patient has been evaluated by pulmonary medicine and conservative therapy and close clinical followup has been decided.  Patient does have a prior history of right breast cancer treated with lumpectomy and RT only  73 year old patient who was seen one month ago and treated for a flare of acute diverticulitis. Complaints today include sinus congestion.  She has been using a saline rinse.  For the past 3 or 4 weeks.  He is also noted pruritus.  She has been using Benadryl without relief.  She has self discontinued amlodipine due to the pruritus. For the past few weeks.  She has also noticed some increasing shortness of breath.  She also describes some mild chest wall pain intermittently.  Her weight is down 1 pound  Past Medical History  Diagnosis Date  . HYPOTHYROIDISM 03/03/2010  . HYPERLIPIDEMIA 03/03/2010  . CEREBROVASCULAR DISEASE 03/03/2010  . DIVERTICULOSIS, COLON 03/03/2010  . Low back pain   . Right leg pain   . History of cerebral artery stenosis     right middle  . Mastoiditis     noted on brain MRI  . Foramen ovale     positive bubble study  . Diverticulitis   . Fatty liver 08/02/09    as per U/S done by [facility]    . Internal hemorrhoid   . Stroke   . Anxiety   . Hypertension   . Ulcer   . Cancer     right breat ca  , lumpectomy and radiation tx (declined chemo and additional prophylactic meds)    History   Social History  . Marital Status: Married    Spouse Name: N/A    Number of Children: 2  . Years of Education: N/A   Occupational History  . Social Worker--Retired    Social History Main Topics  . Smoking status: Former Smoker -- 0.50 packs/day for 20 years    Types: Cigarettes    Quit date: 05/01/1991  . Smokeless tobacco: Never Used  . Alcohol Use: No  . Drug Use: No  . Sexual Activity: Yes   Other Topics Concern  . Not on file   Social History Narrative   Daily caffeine     Past Surgical History  Procedure Laterality Date  . Cholecystectomy    . Breast lumpectomy      right  . Tonsillectomy      Family History  Problem Relation Age of Onset  . Heart disease Sister     cerebral vascular disease also  . Heart disease Brother     cerebral vascular disease also  . Heart disease Brother     cerebral vascular disease  . Heart disease Sister     cebreal vascular disease also  . Heart disease Sister  cebreal vascular diease also  . Heart disease Sister     cerebral vascular diease also  . Colon cancer Neg Hx   . Esophageal cancer Neg Hx   . Rectal cancer Neg Hx   . Stomach cancer Father     Allergies  Allergen Reactions  . Ciprofloxacin Anaphylaxis    Current Outpatient Prescriptions on File Prior to Visit  Medication Sig Dispense Refill  . ALPRAZolam (XANAX) 0.25 MG tablet Take 0.25 mg by mouth every 8 (eight) hours as needed for anxiety.      Marland Kitchen aspirin 81 MG tablet Take 81 mg by mouth daily.      Marland Kitchen dicyclomine (BENTYL) 10 MG capsule Take 10 mg by mouth 3 (three) times daily as needed for spasms.       . Multiple Vitamin (MULTIVITAMIN WITH MINERALS) TABS tablet Take 1 tablet by mouth daily.      . simvastatin (ZOCOR) 5 MG tablet Take 1 tablet (5 mg  total) by mouth daily.  90 tablet  6  . amLODipine (NORVASC) 2.5 MG tablet Take 1 tablet (2.5 mg total) by mouth daily.  90 tablet  3  . docusate sodium (COLACE) 100 MG capsule Take 1 capsule (100 mg total) by mouth every 12 (twelve) hours.  10 capsule  0  . lactulose (CHRONULAC) 10 GM/15ML solution Take 15 mLs (10 g total) by mouth 2 (two) times daily as needed for mild constipation or moderate constipation.  240 mL  0   No current facility-administered medications on file prior to visit.    BP 120/72  Pulse 94  Temp(Src) 97.6 F (36.4 C) (Oral)  Resp 20  Ht 5\' 2"  (1.575 m)  Wt 127 lb (57.607 kg)  BMI 23.22 kg/m2  SpO2 98%       Review of Systems  Constitutional: Positive for appetite change and fatigue.  HENT: Positive for sinus pressure. Negative for congestion, dental problem, hearing loss, rhinorrhea, sore throat and tinnitus.   Eyes: Negative for pain, discharge and visual disturbance.  Respiratory: Positive for shortness of breath. Negative for cough.   Cardiovascular: Positive for chest pain. Negative for palpitations and leg swelling.  Gastrointestinal: Negative for nausea, vomiting, abdominal pain, diarrhea, constipation, blood in stool and abdominal distention.  Genitourinary: Negative for dysuria, urgency, frequency, hematuria, flank pain, vaginal bleeding, vaginal discharge, difficulty urinating, vaginal pain and pelvic pain.  Musculoskeletal: Negative for arthralgias, gait problem and joint swelling.  Skin: Negative for rash.       Pruritus  Neurological: Negative for dizziness, syncope, speech difficulty, weakness, numbness and headaches.  Hematological: Negative for adenopathy.  Psychiatric/Behavioral: Negative for behavioral problems, dysphoric mood and agitation. The patient is not nervous/anxious.        Objective:   Physical Exam  Constitutional: She is oriented to person, place, and time. She appears well-developed and well-nourished.  HENT:  Head:  Normocephalic.  Right Ear: External ear normal.  Left Ear: External ear normal.  Mouth/Throat: Oropharynx is clear and moist.  Eyes: Conjunctivae and EOM are normal. Pupils are equal, round, and reactive to light.  Neck: Normal range of motion. Neck supple. No thyromegaly present.  Cardiovascular: Normal rate, regular rhythm, normal heart sounds and intact distal pulses.   Pulmonary/Chest: Effort normal and breath sounds normal.  Abdominal: Soft. Bowel sounds are normal. She exhibits no mass. There is no tenderness.  Musculoskeletal: Normal range of motion.  Lymphadenopathy:    She has no cervical adenopathy.  Neurological: She is alert and oriented  to person, place, and time.  Skin: Skin is warm and dry. No rash noted.  Psychiatric: She has a normal mood and affect. Her behavior is normal.          Assessment & Plan:   Pulmonary adenopathy.  We'll check a LDH and sedimentation rate.  We'll discuss oncology referral.  Will check a followup chest x-ray in view of her new symptoms of shortness of breath nonspecific chest discomfort Hypertension.  We'll continue to hold amlodipine Simvastatin.  We'll hold Sinus congestion/history rhinitis.  We will continue with saline rinse.  Will and fluticasone.

## 2013-12-14 NOTE — Progress Notes (Signed)
   Subjective:    Patient ID: Erica Young, female    DOB: 03/06/1941, 73 y.o.   MRN: [id number]  HPI  Wt Readings from Last 3 Encounters:  12/14/13 127 lb (57.607 kg)  11/11/13 128 lb (58.06 kg)  09/16/13 130 lb (58.968 kg)    Review of Systems     Objective:   Physical Exam        Assessment & Plan:

## 2013-12-14 NOTE — Patient Instructions (Signed)
Add fluticasone nasal spray daily Continue to hold both amlodipine and simvastatin  Hematology /Oncology consultation as discussed  Return in one month for followup

## 2013-12-15 ENCOUNTER — Ambulatory Visit: Payer: Medicare Other | Admitting: Internal Medicine

## 2013-12-15 LAB — LACTATE DEHYDROGENASE: LDH: 144 U/L (ref 94–250)

## 2013-12-17 ENCOUNTER — Telehealth: Payer: Self-pay | Admitting: Internal Medicine

## 2013-12-17 NOTE — Telephone Encounter (Signed)
Please see message and advise 

## 2013-12-17 NOTE — Telephone Encounter (Signed)
S/W PATIENT AND GAVE NP APPT FOR 08/25 @ 1:30 W/DR. MOHAMED.  DR. Burnice Logan DX- HILAR ADENOPATHY

## 2013-12-17 NOTE — Telephone Encounter (Signed)
Patient Information:  Caller Name: Romina  Phone: [phone]  Patient: Erica Young  Gender: Female  DOB: 12/26/1940  Age: 73 Years  PCP: Bluford Kaufmann (Family Practice > 72yrs old)  Office Follow Up:  Does the office need to follow up with this patient?: Yes  Instructions For The Office: Patient has restarted Amlodipine  RN Note:  Patient is calling regarding change in medication at visit on 12/14/13.  She was told to hold amlodipine and symvastin.  Patient did not take amlodipine on 12/15/13 as advised by Provider, but on 12/16/13 she states "I became very anxious and afraid I would have a TIA, so I have started back on my daily dosage of amlodipine.  States she feels much better while taking medicine.  Symptoms  Reason For Call & Symptoms: medication change  Reviewed Health History In EMR: Yes  Reviewed Medications In EMR: Yes  Reviewed Allergies In EMR: Yes  Reviewed Surgeries / Procedures: Yes  Date of Onset of Symptoms: 12/16/2013  Guideline(s) Used:  No Protocol Available - Information Only  Disposition Per Guideline:   Discuss with PCP and Callback by Nurse Today  Reason For Disposition Reached:   Nursing judgment  Advice Given:  Call Back If:  New symptoms develop  Patient Will Follow Care Advice:  YES

## 2013-12-17 NOTE — Telephone Encounter (Signed)
Left message on voicemail to call office.  

## 2013-12-17 NOTE — Telephone Encounter (Signed)
Please call patient and inform that it is fine to continue the amlodipine

## 2013-12-18 NOTE — Telephone Encounter (Signed)
Pt.notified

## 2013-12-22 ENCOUNTER — Encounter: Payer: Self-pay | Admitting: Internal Medicine

## 2013-12-22 ENCOUNTER — Ambulatory Visit: Payer: Medicare Other

## 2013-12-22 ENCOUNTER — Other Ambulatory Visit (HOSPITAL_BASED_OUTPATIENT_CLINIC_OR_DEPARTMENT_OTHER): Payer: Medicare Other

## 2013-12-22 ENCOUNTER — Other Ambulatory Visit: Payer: Medicare Other

## 2013-12-22 ENCOUNTER — Ambulatory Visit: Payer: Medicare Other | Admitting: Internal Medicine

## 2013-12-22 ENCOUNTER — Ambulatory Visit (HOSPITAL_BASED_OUTPATIENT_CLINIC_OR_DEPARTMENT_OTHER): Payer: Medicare Other | Admitting: Internal Medicine

## 2013-12-22 VITALS — BP 143/70 | HR 72 | Temp 97.6°F | Resp 19 | Ht 62.0 in | Wt 128.8 lb

## 2013-12-22 DIAGNOSIS — J310 Chronic rhinitis: Secondary | ICD-10-CM | POA: Diagnosis not present

## 2013-12-22 DIAGNOSIS — I10 Essential (primary) hypertension: Secondary | ICD-10-CM

## 2013-12-22 DIAGNOSIS — R1031 Right lower quadrant pain: Secondary | ICD-10-CM | POA: Diagnosis not present

## 2013-12-22 DIAGNOSIS — R1032 Left lower quadrant pain: Secondary | ICD-10-CM | POA: Diagnosis not present

## 2013-12-22 DIAGNOSIS — E785 Hyperlipidemia, unspecified: Secondary | ICD-10-CM | POA: Diagnosis not present

## 2013-12-22 DIAGNOSIS — K5900 Constipation, unspecified: Secondary | ICD-10-CM | POA: Diagnosis not present

## 2013-12-22 DIAGNOSIS — R599 Enlarged lymph nodes, unspecified: Secondary | ICD-10-CM | POA: Diagnosis not present

## 2013-12-22 DIAGNOSIS — I679 Cerebrovascular disease, unspecified: Secondary | ICD-10-CM | POA: Diagnosis not present

## 2013-12-22 DIAGNOSIS — S20219A Contusion of unspecified front wall of thorax, initial encounter: Secondary | ICD-10-CM | POA: Diagnosis not present

## 2013-12-22 DIAGNOSIS — R590 Localized enlarged lymph nodes: Secondary | ICD-10-CM

## 2013-12-22 DIAGNOSIS — K5732 Diverticulitis of large intestine without perforation or abscess without bleeding: Secondary | ICD-10-CM | POA: Diagnosis not present

## 2013-12-22 DIAGNOSIS — R109 Unspecified abdominal pain: Secondary | ICD-10-CM | POA: Diagnosis not present

## 2013-12-22 DIAGNOSIS — G8929 Other chronic pain: Secondary | ICD-10-CM | POA: Diagnosis not present

## 2013-12-22 DIAGNOSIS — L84 Corns and callosities: Secondary | ICD-10-CM | POA: Diagnosis not present

## 2013-12-22 DIAGNOSIS — R634 Abnormal weight loss: Secondary | ICD-10-CM | POA: Diagnosis not present

## 2013-12-22 DIAGNOSIS — E039 Hypothyroidism, unspecified: Secondary | ICD-10-CM

## 2013-12-22 LAB — CBC WITH DIFFERENTIAL/PLATELET
BASO%: 0.9 % (ref 0.0–2.0)
Basophils Absolute: 0.1 10*3/uL (ref 0.0–0.1)
EOS ABS: 0.2 10*3/uL (ref 0.0–0.5)
EOS%: 4.4 % (ref 0.0–7.0)
HCT: 40.5 % (ref 34.8–46.6)
HGB: 13.4 g/dL (ref 11.6–15.9)
LYMPH%: 30.5 % (ref 14.0–49.7)
MCH: 31.9 pg (ref 25.1–34.0)
MCHC: 33 g/dL (ref 31.5–36.0)
MCV: 96.5 fL (ref 79.5–101.0)
MONO#: 0.4 10*3/uL (ref 0.1–0.9)
MONO%: 6.9 % (ref 0.0–14.0)
NEUT#: 3 10*3/uL (ref 1.5–6.5)
NEUT%: 57.3 % (ref 38.4–76.8)
Platelets: 174 10*3/uL (ref 145–400)
RBC: 4.2 10*6/uL (ref 3.70–5.45)
RDW: 14 % (ref 11.2–14.5)
WBC: 5.3 10*3/uL (ref 3.9–10.3)
lymph#: 1.6 10*3/uL (ref 0.9–3.3)

## 2013-12-22 LAB — COMPREHENSIVE METABOLIC PANEL (CC13)
ALBUMIN: 3.8 g/dL (ref 3.5–5.0)
ALK PHOS: 86 U/L (ref 40–150)
ALT: 17 U/L (ref 0–55)
AST: 20 U/L (ref 5–34)
Anion Gap: 7 mEq/L (ref 3–11)
BUN: 9.9 mg/dL (ref 7.0–26.0)
CALCIUM: 9.3 mg/dL (ref 8.4–10.4)
CO2: 26 mEq/L (ref 22–29)
Chloride: 105 mEq/L (ref 98–109)
Creatinine: 0.7 mg/dL (ref 0.6–1.1)
Glucose: 95 mg/dl (ref 70–140)
POTASSIUM: 4 meq/L (ref 3.5–5.1)
SODIUM: 137 meq/L (ref 136–145)
TOTAL PROTEIN: 7.2 g/dL (ref 6.4–8.3)
Total Bilirubin: 0.39 mg/dL (ref 0.20–1.20)

## 2013-12-22 LAB — LACTATE DEHYDROGENASE (CC13): LDH: 181 U/L (ref 125–245)

## 2013-12-22 NOTE — Progress Notes (Signed)
Checked in new pt with no financial concerns. °

## 2013-12-22 NOTE — Progress Notes (Signed)
[place] [phone]   Fax:([phone]) [phone]  CONSULT NOTE  REFERRING PHYSICIAN: Dr. Marletta Lor   REASON FOR CONSULTATION:  73 years old Hispanic female with mediastinal lymphadenopathy.  HPI Erica Young is a 73 y.o. female was past medical history significant for hypertension, dyslipidemia, TIA, diverticulosis, chronic rhinitis as well as history of right breast carcinoma status post lumpectomy followed by radiotherapy and 2007 and the patient declined any further treatment at that time. The patient mentions that in December of 2014 she had peripheral and hurt her left ribs.  During her evaluation CT scan of the chest was performed on 05/05/2013 and it showed Extensive mediastinal and right hilar adenopathy is noted. Specific examples include a 2.8 x 1.5 cm right hilar nodal mass, 2.3 x 4.1 cm subcarinal nodal mass, 1.8 x 3.4 cm low left paratracheal lymph node, and 1.7 x 2.4 cm in lower right paratracheal lymph node, as well as other smaller lymph nodes in the mediastinum. The findings were highly suspicious for malignancy and could represent a manifestation of lymphoma or a primary bronchogenic malignancy such as a small cell carcinoma versus metastatic disease from breast cancer. Bronchoscopy was suggested to her that time. There was also multiple small pulmonary nodules scattered throughout the lungs bilaterally the largest of which measures only 9 mm in the periphery of the right lower lobe. The patient was referred to Dr. Melvyn Novas and a PET scan was performed on 05/21/2013 and it showed Hypermetabolic paratracheal, subcarinal, and right hilar adenopathy consistent with metastatic adenopathy. Hypermetabolic focus adjacent to the right lobe of thyroid gland. This either represents a metastatic supraclavicular lymph node, metastasis to the right thyroid gland, or less likely prior primary thyroid carcinoma. Favor a metastatic lymph node. Ultrasound evaluation  the neck the could delineate and serve as a mechanism of biopsy. Right lower lobe pulmonary nodule does not have metabolic activity but is below the size limit for accurate PET  characterization. Recommend attention on follow-up. Single hypermetabolic focus within the L5 vertebral body associated with a lucent lesion. Differential includes metabolic activity associated with a hemangioma, degenerative activity or solitary metastasis. Favor hemangioma. Dr. Melvyn Novas ordered ultrasound guided biopsy of the thyroid gland lesion. The final pathology showed benign skeletal muscles with no lymphoid tissue or evidence of malignancy. 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 Dr. [name] discussed with the patient several options at that time including observation versus bronchoscopy with endobronchial ultrasound versus mediastinoscopy and according to his note the patient chose to continue on observation.  She was seen recently by her primary care physician Dr. Burnice Logan and chest x-ray on 12/14/2013 showed mild right hilar prominence suspicious for adenopathy. CT scan of the abdomen was performed on 11/18/2013 and it showed subcarinal lymphadenopathy that was partially imaged in addition to new small pericardial effusion and a stable left lower lobe 6 mm nodule as well as mild subpleural nodularity in the lower lobes bilaterally increased from the prior study.  Dr. Burnice Logan kindly referred the patient to me today for evaluation and recommendation regarding her condition. When seen today she continues to complain of tightness and pressure in the center of her chest as well as weight loss of around 12 pounds in the last 9 months. She also has shortness breath with exertion as well as chest congestion and cough productive of whitish sputum. She denied having any headache or visual changes. Family history significant for stroke and father with stomach cancer. The patient married and  has 2 sons.  She originally from [place] who lived most recently 4 years ago from [place] to [place]. Is a retired Education officer, museum. She has a history of smoking one pack per day for around 3 years but quit 22 years ago. She drinks alcohol occasionally and a history of drug abuse.   HPI  Past Medical History  Diagnosis Date  . HYPOTHYROIDISM 03/03/2010  . HYPERLIPIDEMIA 03/03/2010  . CEREBROVASCULAR DISEASE 03/03/2010  . DIVERTICULOSIS, COLON 03/03/2010  . Low back pain   . Right leg pain   . History of cerebral artery stenosis     right middle  . Mastoiditis     noted on brain MRI  . Foramen ovale     positive bubble study  . Diverticulitis   . Fatty liver 08/02/09    as per U/S done by [facility]  . Internal hemorrhoid   . Stroke   . Anxiety   . Hypertension   . Ulcer   . Cancer     right breat ca  , lumpectomy and radiation tx (declined chemo and additional prophylactic meds)    Past Surgical History  Procedure Laterality Date  . Cholecystectomy    . Breast lumpectomy      right  . Tonsillectomy      Family History  Problem Relation Age of Onset  . Heart disease Sister     cerebral vascular disease also  . Heart disease Brother     cerebral vascular disease also  . Heart disease Brother     cerebral vascular disease  . Heart disease Sister     cebreal vascular disease also  . Heart disease Sister     cebreal vascular diease also  . Heart disease Sister     cerebral vascular diease also  . Colon cancer Neg Hx   . Esophageal cancer Neg Hx   . Rectal cancer Neg Hx   . Stomach cancer Father     Social History History  Substance Use Topics  . Smoking status: Former Smoker -- 0.50 packs/day for 20 years    Types: Cigarettes    Quit date: 05/01/1991  . Smokeless tobacco: Never Used  . Alcohol Use: No    Allergies  Allergen Reactions  . Ciprofloxacin Anaphylaxis    Current Outpatient Prescriptions  Medication Sig Dispense Refill  . ALPRAZolam  (XANAX) 0.25 MG tablet Take 0.25 mg by mouth every 8 (eight) hours as needed for anxiety.      Marland Kitchen amLODipine (NORVASC) 2.5 MG tablet Take 1 tablet (2.5 mg total) by mouth daily.  90 tablet  3  . aspirin 81 MG tablet Take 81 mg by mouth daily.      . cetirizine (ZYRTEC) 10 MG tablet Take 10 mg by mouth daily.      . fluticasone (FLONASE) 50 MCG/ACT nasal spray Place 2 sprays into both nostrils daily.  16 g  6  . Multiple Vitamin (MULTIVITAMIN WITH MINERALS) TABS tablet Take 1 tablet by mouth daily.      . vitamin B-12 (CYANOCOBALAMIN) 100 MCG tablet Take 100 mcg by mouth daily.      Marland Kitchen dicyclomine (BENTYL) 10 MG capsule Take 10 mg by mouth 3 (three) times daily as needed for spasms.        No current facility-administered medications for this visit.    Review of Systems  Constitutional: positive for fatigue and weight loss Eyes: negative Ears, nose, mouth, throat, and face: negative Respiratory: positive for cough,  dyspnea on exertion and sputum Cardiovascular: negative Gastrointestinal: negative Genitourinary:negative Integument/breast: negative Hematologic/lymphatic: negative Musculoskeletal:negative Neurological: negative Behavioral/Psych: negative Endocrine: negative Allergic/Immunologic: negative  Physical Exam  ZOX:WRUEA, healthy, no distress, well nourished, well developed and anxious SKIN: skin color, texture, turgor are normal, no rashes or significant lesions HEAD: Normocephalic, No masses, lesions, tenderness or abnormalities EYES: normal, PERRLA, Conjunctiva are pink and non-injected EARS: External ears normal, Canals clear OROPHARYNX:no exudate, no erythema and lips, buccal mucosa, and tongue normal  NECK: supple, no adenopathy, no JVD LYMPH:  no palpable lymphadenopathy, no hepatosplenomegaly BREAST:not examined LUNGS: clear to auscultation , and palpation HEART: regular rate & rhythm, no murmurs and no gallops ABDOMEN:abdomen soft, non-tender, normal bowel sounds  and no masses or organomegaly BACK: Back symmetric, no curvature., No CVA tenderness EXTREMITIES:no joint deformities, effusion, or inflammation, no edema, no skin discoloration  NEURO: alert & oriented x 3 with fluent speech, no focal motor/sensory deficits  PERFORMANCE STATUS: ECOG 1  LABORATORY DATA: Lab Results  Component Value Date   WBC 5.3 12/22/2013   HGB 13.4 12/22/2013   HCT 40.5 12/22/2013   MCV 96.5 12/22/2013   PLT 174 12/22/2013      Chemistry      Component Value Date/Time   NA 137 12/22/2013 1401   NA 135* 11/18/2013 1213   K 4.0 12/22/2013 1401   K 4.1 11/18/2013 1213   CL 97 11/18/2013 1213   CO2 26 12/22/2013 1401   CO2 23 11/18/2013 1213   BUN 9.9 12/22/2013 1401   BUN 9 11/18/2013 1213   CREATININE 0.7 12/22/2013 1401   CREATININE 0.55 11/18/2013 1213      Component Value Date/Time   CALCIUM 9.3 12/22/2013 1401   CALCIUM 8.9 11/18/2013 1213   ALKPHOS 86 12/22/2013 1401   ALKPHOS 78 11/18/2013 1213   AST 20 12/22/2013 1401   AST 15 11/18/2013 1213   ALT 17 12/22/2013 1401   ALT 14 11/18/2013 1213   BILITOT 0.39 12/22/2013 1401   BILITOT 0.3 11/18/2013 1213       RADIOGRAPHIC STUDIES: Dg Chest 2 View  12/14/2013   CLINICAL DATA:  Shortness of Breath, smoker, hilar adenopathy  EXAM: CHEST  2 VIEW  COMPARISON:  11/18/2013 and 09/16/2013  FINDINGS: Cardiac size is stable. There is mild right hilar prominence suspicious for adenopathy. No acute infiltrate or pulmonary edema. Surgical clips are noted in right axilla.  IMPRESSION: Mild right hilar prominence suspicious for adenopathy. Surgical clips are noted in right axilla. No acute infiltrate or pulmonary edema   Electronically Signed   By: Lahoma Crocker M.D.   On: 12/14/2013 11:30    ASSESSMENT: This is a very pleasant 73 years old Hispanic female with persistent mediastinal lymphadenopathy of unclear etiology at this point. This could be related to bronchogenic carcinoma or metastatic disease from her breast cancer versus  lymphoma versus chronic granulomatous disease. The patient has no tissue diagnosis. She physically doesn't look so sick to have underlying bronchogenic carcinoma for the last 8 months.   PLAN: I had a lengthy discussion with the patient today about her current condition and further investigation to confirm her diagnosis. I showed the patient images of the previous scans. I recommended for the patient to have repeat PET scan for further evaluation of her condition. I will also refer the patient to cardiothoracic surgery for consideration of tissue diagnosis either with bronchoscopy with endobronchial ultrasound plus/minus mediastinoscopy if needed. I would see her back for followup visit in 2 weeks  for reevaluation and more detailed discussion of her treatment options based on the final staging workup and pathology report. She was advised to call immediately if she has any concerning symptoms in the interval.  The patient voices understanding of current disease status and treatment options and is in agreement with the current care plan.  All questions were answered. The patient knows to call the clinic with any problems, questions or concerns. We can certainly see the patient much sooner if necessary.  Thank you so much for allowing me to participate in the care of [facility]. I will continue to follow up the patient with you and assist in her care.  I spent 40 minutes counseling the patient face to face. The total time spent in the appointment was 60 minutes.  Disclaimer: This note was dictated with voice recognition software. Similar sounding words can inadvertently be transcribed and may not be corrected upon review.   Erica Young K. 12/22/2013, 3:17 PM

## 2013-12-23 ENCOUNTER — Telehealth: Payer: Self-pay | Admitting: Internal Medicine

## 2013-12-23 NOTE — Telephone Encounter (Signed)
s.w pt and advised on Aug and Sept S.w. Mrs. Erica Young @ [facility] and she advised that they will sched appt and contact pt.

## 2013-12-26 ENCOUNTER — Ambulatory Visit (HOSPITAL_COMMUNITY): Admission: RE | Admit: 2013-12-26 | Payer: Medicare Other | Source: Ambulatory Visit

## 2013-12-28 ENCOUNTER — Encounter (HOSPITAL_COMMUNITY)
Admission: RE | Admit: 2013-12-28 | Discharge: 2013-12-28 | Disposition: A | Discharge status: Home / Self Care | Payer: Medicare Other | Source: Ambulatory Visit | Attending: Internal Medicine | Admitting: Internal Medicine

## 2013-12-28 ENCOUNTER — Encounter (HOSPITAL_COMMUNITY): Payer: Self-pay

## 2013-12-28 DIAGNOSIS — R599 Enlarged lymph nodes, unspecified: Secondary | ICD-10-CM | POA: Diagnosis not present

## 2013-12-28 DIAGNOSIS — J984 Other disorders of lung: Secondary | ICD-10-CM | POA: Diagnosis not present

## 2013-12-28 DIAGNOSIS — R590 Localized enlarged lymph nodes: Secondary | ICD-10-CM

## 2013-12-28 LAB — GLUCOSE, CAPILLARY: Glucose-Capillary: 118 mg/dL — ABNORMAL HIGH (ref 70–99)

## 2013-12-28 MED ORDER — FLUDEOXYGLUCOSE F - 18 (FDG) INJECTION
6.3000 | Freq: Once | INTRAVENOUS | Status: AC | PRN
Start: 2013-12-28 — End: 2013-12-28

## 2013-12-30 ENCOUNTER — Other Ambulatory Visit: Payer: Self-pay

## 2013-12-30 ENCOUNTER — Institutional Professional Consult (permissible substitution) (INDEPENDENT_AMBULATORY_CARE_PROVIDER_SITE_OTHER): Payer: Medicare Other | Admitting: Cardiothoracic Surgery

## 2013-12-30 ENCOUNTER — Encounter: Payer: Self-pay | Admitting: Cardiothoracic Surgery

## 2013-12-30 VITALS — BP 133/77 | HR 88 | Ht 62.0 in | Wt 128.0 lb

## 2013-12-30 DIAGNOSIS — R599 Enlarged lymph nodes, unspecified: Secondary | ICD-10-CM | POA: Diagnosis not present

## 2013-12-30 DIAGNOSIS — R590 Localized enlarged lymph nodes: Secondary | ICD-10-CM

## 2013-12-31 ENCOUNTER — Telehealth: Payer: Self-pay | Admitting: Internal Medicine

## 2013-12-31 NOTE — Telephone Encounter (Signed)
pt called to r/s appt..done...pt aware of new d.t °

## 2013-12-31 NOTE — Progress Notes (Signed)
PCP is [name], MD Referring Provider is Curt Bears, MD  Chief Complaint  Patient presents with  . NEW THORACIC    HILAR ADENOPATHY    HPI: Patient examined, PET scan and chest CT scans reviewed. Patient presents for evaluation of persistent mediastinal adenopathy initially demonstrated and evaluated with PET scan earlier this year. At that time the patient was relatively asymptomatic. A CT directed needle biopsy of the right superior mediastinal mass-adenopathy which was hypermetabolic on PET scan was nondiagnostic. Followup and observation was decided upon. Patient has past history right breast cancer treated with lumpectomy and chemotherapy. She has a past history of smoking.  She recently became symptomatic with some weight loss and night sweats. She was reevaluated by oncology-Dr. Julien Nordmann. A repeat PET scan was performed which showed persistent hypermetabolic mediastinal adenopathy without specific significant pulmonary mass. She now has hypermetabolic bone lesions in the spine at T7-L1 and L5 as well as the proximal right femur. She has a probable pathologic fracture of the left fourth rib posteriorly Thoracic surgical evaluation for biopsy was requested.   Past Medical History  Diagnosis Date  . HYPOTHYROIDISM 03/03/2010  . HYPERLIPIDEMIA 03/03/2010  . CEREBROVASCULAR DISEASE 03/03/2010  . DIVERTICULOSIS, COLON 03/03/2010  . Low back pain   . Right leg pain   . History of cerebral artery stenosis     right middle  . Mastoiditis     noted on brain MRI  . Foramen ovale     positive bubble study  . Diverticulitis   . Fatty liver 08/02/09    as per U/S done by [facility]  . Internal hemorrhoid   . Stroke   . Anxiety   . Hypertension   . Ulcer   . Cancer     right breat ca  , lumpectomy and radiation tx (declined chemo and additional prophylactic meds)    Past Surgical History  Procedure Laterality Date  . Cholecystectomy    . Breast  lumpectomy      right  . Tonsillectomy      Family History  Problem Relation Age of Onset  . Heart disease Sister     cerebral vascular disease also  . Heart disease Brother     cerebral vascular disease also  . Heart disease Brother     cerebral vascular disease  . Heart disease Sister     cebreal vascular disease also  . Heart disease Sister     cebreal vascular diease also  . Heart disease Sister     cerebral vascular diease also  . Colon cancer Neg Hx   . Esophageal cancer Neg Hx   . Rectal cancer Neg Hx   . Stomach cancer Father     Social History History  Substance Use Topics  . Smoking status: Former Smoker -- 0.50 packs/day for 20 years    Types: Cigarettes    Quit date: 05/01/1991  . Smokeless tobacco: Never Used  . Alcohol Use: No    Current Outpatient Prescriptions  Medication Sig Dispense Refill  . ALPRAZolam (XANAX) 0.25 MG tablet Take 0.25 mg by mouth every 8 (eight) hours as needed for anxiety.      Marland Kitchen amLODipine (NORVASC) 2.5 MG tablet Take 1 tablet (2.5 mg total) by mouth daily.  90 tablet  3  . aspirin 81 MG tablet Take 81 mg by mouth daily.      . cetirizine (ZYRTEC) 10 MG tablet Take 10 mg by mouth daily.      Marland Kitchen  dicyclomine (BENTYL) 10 MG capsule Take 10 mg by mouth 3 (three) times daily as needed for spasms.       . Multiple Vitamin (MULTIVITAMIN WITH MINERALS) TABS tablet Take 1 tablet by mouth daily.      . vitamin B-12 (CYANOCOBALAMIN) 100 MCG tablet Take 100 mcg by mouth daily.      . fluticasone (FLONASE) 50 MCG/ACT nasal spray Place 2 sprays into both nostrils daily.  16 g  6   No current facility-administered medications for this visit.    Allergies  Allergen Reactions  . Ciprofloxacin Anaphylaxis    Review of Systems No significant shortness of breath Positive for left-sided chest pain correlating with the area of probable pathologic fracture on PET scan History of stroke with a patent foramen ovale with neurologic recovery No  history DVT No history of diabetes Positive history of diverticular disease Positive history hypertension No history of angina MI Patient having intermittent back pain which corresponds to the abnormal lesions now on-PET scan  BP 133/77  Pulse 88  Ht 5\' 2"  (1.575 m)  Wt 128 lb (58.06 kg)  BMI 23.41 kg/m2 Physical Exam General appearance middle-aged female no acute distress accompanied by her husband HEENT normocephalic pupils equal dentition good Neck without JVD mass or adenopathy Thorax without deformity or tenderness breath sounds clear and equal Cardiac regular rhythm without murmur or gallop Abdomen soft nontender without organomegaly or pulsatile mass Extremities without edema or tenderness Vascular palpable pulses in the extremities Neuro without focal motor deficit  Diagnostic Tests: PET scan shows persistent adenopathy in the mediastinum especially super carinal and subcarinal. These nodes are hypermetabolic. She has hypermetabolic foci now her spine at T7 and L1 and L5.  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 BUS was discussed with the patient and husband. The patient will accept this diagnostic procedure but she does not wish mediastinoscopy with incision if the E. BUS is nondiagnostic.  Bronchoscopy under general anesthesia with ultrasound directed transbronchial biopsy of mediastinal nodes scheduled for September 10 at [facility] hospital

## 2014-01-01 ENCOUNTER — Encounter (HOSPITAL_COMMUNITY): Payer: Self-pay | Admitting: Pharmacy Technician

## 2014-01-02 ENCOUNTER — Other Ambulatory Visit: Payer: Self-pay | Admitting: Internal Medicine

## 2014-01-06 ENCOUNTER — Encounter (HOSPITAL_COMMUNITY)
Admission: RE | Admit: 2014-01-06 | Discharge: 2014-01-06 | Disposition: A | Discharge status: Home / Self Care | Payer: Medicare Other | Source: Ambulatory Visit | Attending: Cardiothoracic Surgery | Admitting: Cardiothoracic Surgery

## 2014-01-06 ENCOUNTER — Encounter (HOSPITAL_COMMUNITY): Payer: Self-pay

## 2014-01-06 ENCOUNTER — Ambulatory Visit: Payer: Medicare Other | Admitting: Internal Medicine

## 2014-01-06 VITALS — BP 131/81 | HR 82 | Temp 97.6°F | Resp 18 | Ht 62.0 in | Wt 129.3 lb

## 2014-01-06 DIAGNOSIS — I10 Essential (primary) hypertension: Secondary | ICD-10-CM | POA: Diagnosis not present

## 2014-01-06 DIAGNOSIS — Z8673 Personal history of transient ischemic attack (TIA), and cerebral infarction without residual deficits: Secondary | ICD-10-CM | POA: Diagnosis not present

## 2014-01-06 DIAGNOSIS — E039 Hypothyroidism, unspecified: Secondary | ICD-10-CM | POA: Diagnosis not present

## 2014-01-06 DIAGNOSIS — C7800 Secondary malignant neoplasm of unspecified lung: Secondary | ICD-10-CM | POA: Diagnosis not present

## 2014-01-06 DIAGNOSIS — R599 Enlarged lymph nodes, unspecified: Secondary | ICD-10-CM | POA: Diagnosis not present

## 2014-01-06 DIAGNOSIS — Z87891 Personal history of nicotine dependence: Secondary | ICD-10-CM | POA: Diagnosis not present

## 2014-01-06 DIAGNOSIS — Z853 Personal history of malignant neoplasm of breast: Secondary | ICD-10-CM | POA: Diagnosis not present

## 2014-01-06 DIAGNOSIS — R590 Localized enlarged lymph nodes: Secondary | ICD-10-CM

## 2014-01-06 HISTORY — DX: Fracture of one rib, unspecified side, initial encounter for closed fracture: S22.39XA

## 2014-01-06 HISTORY — DX: Shortness of breath: R06.02

## 2014-01-06 HISTORY — DX: Headache: R51

## 2014-01-06 LAB — PROTIME-INR
INR: 0.99 (ref 0.00–1.49)
Prothrombin Time: 13.1 seconds (ref 11.6–15.2)

## 2014-01-06 LAB — APTT: aPTT: 29 seconds (ref 24–37)

## 2014-01-06 LAB — COMPREHENSIVE METABOLIC PANEL
ALT: 16 U/L (ref 0–35)
AST: 17 U/L (ref 0–37)
Albumin: 3.5 g/dL (ref 3.5–5.2)
Alkaline Phosphatase: 87 U/L (ref 39–117)
Anion gap: 12 (ref 5–15)
BUN: 9 mg/dL (ref 6–23)
CO2: 22 mEq/L (ref 19–32)
Calcium: 9 mg/dL (ref 8.4–10.5)
Chloride: 104 mEq/L (ref 96–112)
Creatinine, Ser: 0.56 mg/dL (ref 0.50–1.10)
GFR calc Af Amer: >90 mL/min (ref >90)
GFR calc non Af Amer: >90 mL/min (ref >90)
Glucose, Bld: 99 mg/dL (ref 70–99)
Potassium: 4.3 mEq/L (ref 3.7–5.3)
Sodium: 138 mEq/L (ref 137–147)
Total Bilirubin: 0.3 mg/dL (ref 0.3–1.2)
Total Protein: 6.8 g/dL (ref 6.0–8.3)

## 2014-01-06 LAB — CBC
HCT: 39.1 % (ref 36.0–46.0)
Hemoglobin: 13.5 g/dL (ref 12.0–15.0)
MCH: 32.8 pg (ref 26.0–34.0)
MCHC: 34.5 g/dL (ref 30.0–36.0)
MCV: 94.9 fL (ref 78.0–100.0)
Platelets: 170 10*3/uL (ref 150–400)
RBC: 4.12 MIL/uL (ref 3.87–5.11)
RDW: 13.4 % (ref 11.5–15.5)
WBC: 4.9 10*3/uL (ref 4.0–10.5)

## 2014-01-06 NOTE — Progress Notes (Addendum)
Pt c/o some chest pain across mid sternal region. More on the right. States it has been off and on for months. States it's sore to touch on the right side. She states at night if she lays flat she feels heaviness in her chest. Also c/o dry cough and sinus like headaches. Pt denies sob or diaphoresis with the chest pain. States it usually only lasts a few minutes and goes away.   By end of PAT appt, pt was no longer having chest pain.

## 2014-01-06 NOTE — Pre-Procedure Instructions (Signed)
Erica Young  01/06/2014   Your procedure is scheduled on:  Thursday, January 07, 2014 at 8:00 AM.   Report to [facility] Entrance "A" Admitting Office at 6:00 AM.   Call this number if you have problems the morning of surgery: [phone]   Remember:   Do not eat food or drink liquids after midnight tonight.   Take these medicines the morning of surgery with A SIP OF WATER: amLODipine (NORVASC), cetirizine (ZYRTEC), ALPRAZolam Duanne Moron) - if needed.   Do not wear jewelry, make-up or nail polish.  Do not wear lotions, powders, or perfumes. You may wear deodorant.  Do not shave 48 hours prior to surgery.   Do not bring valuables to the hospital.  [facility] is not responsible                  for any belongings or valuables.               Contacts, dentures or bridgework may not be worn into surgery.  Leave suitcase in the car. After surgery it may be brought to your room.  For patients admitted to the hospital, discharge time is determined by your                treatment team.               Patients discharged the day of surgery will not be allowed to drive home.    Special Instructions: [place] - Preparing for Surgery  Before surgery, you can play an important role.  Because skin is not sterile, your skin needs to be as free of germs as possible.  You can reduce the number of germs on you skin by washing with CHG (chlorahexidine gluconate) soap before surgery.  CHG is an antiseptic cleaner which kills germs and bonds with the skin to continue killing germs even after washing.  Please DO NOT use if you have an allergy to CHG or antibacterial soaps.  If your skin becomes reddened/irritated stop using the CHG and inform your nurse when you arrive at Short Stay.  Do not shave (including legs and underarms) for at least 48 hours prior to the first CHG shower.  You may shave your face.  Please follow these instructions carefully:   1.  Shower with CHG Soap the night  before surgery and the                                morning of Surgery.  2.  If you choose to wash your hair, wash your hair first as usual with your       normal shampoo.  3.  After you shampoo, rinse your hair and body thoroughly to remove the                      Shampoo.  4.  Use CHG as you would any other liquid soap.  You can apply chg directly       to the skin and wash gently with scrungie or a clean washcloth.  5.  Apply the CHG Soap to your body ONLY FROM THE NECK DOWN.        Do not use on open wounds or open sores.  Avoid contact with your eyes, ears, mouth and genitals (private parts).  Wash genitals (private parts) with your normal soap.  6.  Wash thoroughly, paying special  attention to the area where your surgery        will be performed.  7.  Thoroughly rinse your body with warm water from the neck down.  8.  DO NOT shower/wash with your normal soap after using and rinsing off       the CHG Soap.  9.  Pat yourself dry with a clean towel.            10.  Wear clean pajamas.            11.  Place clean sheets on your bed the night of your first shower and do not        sleep with pets.  Day of Surgery  Do not apply any lotions the morning of surgery.  Please wear clean clothes to the hospital/surgery center.     Please read over the following fact sheets that you were given: Pain Booklet, Coughing and Deep Breathing and Surgical Site Infection Prevention

## 2014-01-06 NOTE — Progress Notes (Signed)
Pt states she has a slightly loose tooth on upper left.

## 2014-01-07 ENCOUNTER — Encounter (HOSPITAL_COMMUNITY): Payer: Medicare Other | Admitting: Anesthesiology

## 2014-01-07 ENCOUNTER — Encounter (HOSPITAL_COMMUNITY): Payer: Self-pay | Admitting: Anesthesiology

## 2014-01-07 ENCOUNTER — Ambulatory Visit: Payer: Medicare Other

## 2014-01-07 ENCOUNTER — Ambulatory Visit (HOSPITAL_COMMUNITY): Payer: Medicare Other

## 2014-01-07 ENCOUNTER — Encounter (HOSPITAL_COMMUNITY)
Admission: RE | Disposition: A | Discharge status: Home / Self Care | Payer: Self-pay | Source: Ambulatory Visit | Attending: Cardiothoracic Surgery

## 2014-01-07 ENCOUNTER — Ambulatory Visit (HOSPITAL_COMMUNITY)
Admission: RE | Admit: 2014-01-07 | Discharge: 2014-01-07 | Disposition: A | Discharge status: Home / Self Care | Payer: Medicare Other | Source: Ambulatory Visit | Attending: Cardiothoracic Surgery | Admitting: Cardiothoracic Surgery

## 2014-01-07 ENCOUNTER — Ambulatory Visit (HOSPITAL_COMMUNITY): Payer: Medicare Other | Admitting: Anesthesiology

## 2014-01-07 DIAGNOSIS — Z9889 Other specified postprocedural states: Secondary | ICD-10-CM | POA: Diagnosis not present

## 2014-01-07 DIAGNOSIS — Z853 Personal history of malignant neoplasm of breast: Secondary | ICD-10-CM | POA: Insufficient documentation

## 2014-01-07 DIAGNOSIS — E039 Hypothyroidism, unspecified: Secondary | ICD-10-CM | POA: Diagnosis not present

## 2014-01-07 DIAGNOSIS — I10 Essential (primary) hypertension: Secondary | ICD-10-CM | POA: Diagnosis not present

## 2014-01-07 DIAGNOSIS — C3490 Malignant neoplasm of unspecified part of unspecified bronchus or lung: Secondary | ICD-10-CM | POA: Diagnosis not present

## 2014-01-07 DIAGNOSIS — C7800 Secondary malignant neoplasm of unspecified lung: Secondary | ICD-10-CM | POA: Diagnosis not present

## 2014-01-07 DIAGNOSIS — R599 Enlarged lymph nodes, unspecified: Secondary | ICD-10-CM | POA: Diagnosis not present

## 2014-01-07 DIAGNOSIS — J9819 Other pulmonary collapse: Secondary | ICD-10-CM | POA: Diagnosis not present

## 2014-01-07 DIAGNOSIS — R590 Localized enlarged lymph nodes: Secondary | ICD-10-CM

## 2014-01-07 DIAGNOSIS — Z87891 Personal history of nicotine dependence: Secondary | ICD-10-CM | POA: Insufficient documentation

## 2014-01-07 DIAGNOSIS — Z8673 Personal history of transient ischemic attack (TIA), and cerebral infarction without residual deficits: Secondary | ICD-10-CM | POA: Insufficient documentation

## 2014-01-07 DIAGNOSIS — D381 Neoplasm of uncertain behavior of trachea, bronchus and lung: Secondary | ICD-10-CM | POA: Diagnosis not present

## 2014-01-07 HISTORY — PX: VIDEO BRONCHOSCOPY WITH ENDOBRONCHIAL ULTRASOUND: SHX6177

## 2014-01-07 SURGERY — BRONCHOSCOPY, WITH EBUS
Anesthesia: General

## 2014-01-07 MED ORDER — ARTIFICIAL TEARS OP OINT
TOPICAL_OINTMENT | OPHTHALMIC | Status: DC | PRN
  Administered 2014-01-07: 1 via OPHTHALMIC

## 2014-01-07 MED ORDER — 0.9 % SODIUM CHLORIDE (POUR BTL) OPTIME
TOPICAL | Status: DC | PRN
  Administered 2014-01-07: 1000 mL

## 2014-01-07 MED ORDER — ONDANSETRON HCL 4 MG/2ML IJ SOLN
INTRAMUSCULAR | Status: AC
  Filled 2014-01-07: qty 2

## 2014-01-07 MED ORDER — FENTANYL CITRATE 0.05 MG/ML IJ SOLN
INTRAMUSCULAR | Status: DC | PRN
  Administered 2014-01-07: 100 ug via INTRAVENOUS

## 2014-01-07 MED ORDER — ROCURONIUM BROMIDE 100 MG/10ML IV SOLN
INTRAVENOUS | Status: DC | PRN
  Administered 2014-01-07: 40 mg via INTRAVENOUS

## 2014-01-07 MED ORDER — OXYCODONE HCL 5 MG PO TABS: 5.0000 mg | ORAL_TABLET | Freq: Once | ORAL | Status: DC | PRN

## 2014-01-07 MED ORDER — LACTATED RINGERS IV SOLN
INTRAVENOUS | Status: DC | PRN
  Administered 2014-01-07 (×2): via INTRAVENOUS

## 2014-01-07 MED ORDER — ROCURONIUM BROMIDE 50 MG/5ML IV SOLN
INTRAVENOUS | Status: AC
  Filled 2014-01-07: qty 1

## 2014-01-07 MED ORDER — MIDAZOLAM HCL 5 MG/5ML IJ SOLN
INTRAMUSCULAR | Status: DC | PRN
  Administered 2014-01-07 (×2): 1 mg via INTRAVENOUS

## 2014-01-07 MED ORDER — NEOSTIGMINE METHYLSULFATE 10 MG/10ML IV SOLN
INTRAVENOUS | Status: DC | PRN
  Administered 2014-01-07: 5 mg via INTRAVENOUS

## 2014-01-07 MED ORDER — OXYCODONE HCL 5 MG/5ML PO SOLN: 5.0000 mg | Freq: Once | ORAL | Status: DC | PRN

## 2014-01-07 MED ORDER — PROMETHAZINE HCL 25 MG/ML IJ SOLN: 6.2500 mg | INTRAMUSCULAR | Status: DC | PRN

## 2014-01-07 MED ORDER — PROPOFOL 10 MG/ML IV BOLUS
INTRAVENOUS | Status: AC
  Filled 2014-01-07: qty 20

## 2014-01-07 MED ORDER — SCOPOLAMINE 1 MG/3DAYS TD PT72
MEDICATED_PATCH | TRANSDERMAL | Status: AC
  Administered 2014-01-07: 1 via TRANSDERMAL
  Filled 2014-01-07: qty 1

## 2014-01-07 MED ORDER — PHENYLEPHRINE HCL 10 MG/ML IJ SOLN
INTRAMUSCULAR | Status: DC | PRN
  Administered 2014-01-07 (×4): 40 ug via INTRAVENOUS

## 2014-01-07 MED ORDER — DIPHENHYDRAMINE HCL 50 MG/ML IJ SOLN
INTRAMUSCULAR | Status: DC | PRN
  Administered 2014-01-07: 12.5 mg via INTRAVENOUS

## 2014-01-07 MED ORDER — SUCCINYLCHOLINE CHLORIDE 20 MG/ML IJ SOLN
INTRAMUSCULAR | Status: AC
  Filled 2014-01-07: qty 1

## 2014-01-07 MED ORDER — DEXAMETHASONE SODIUM PHOSPHATE 4 MG/ML IJ SOLN
INTRAMUSCULAR | Status: DC | PRN
  Administered 2014-01-07: 4 mg via INTRAVENOUS

## 2014-01-07 MED ORDER — DEXAMETHASONE SODIUM PHOSPHATE 4 MG/ML IJ SOLN
INTRAMUSCULAR | Status: AC
  Filled 2014-01-07: qty 1

## 2014-01-07 MED ORDER — MIDAZOLAM HCL 2 MG/2ML IJ SOLN
INTRAMUSCULAR | Status: AC
  Filled 2014-01-07: qty 2

## 2014-01-07 MED ORDER — EPHEDRINE SULFATE 50 MG/ML IJ SOLN
INTRAMUSCULAR | Status: DC | PRN
  Administered 2014-01-07 (×2): 10 mg via INTRAVENOUS

## 2014-01-07 MED ORDER — GLYCOPYRROLATE 0.2 MG/ML IJ SOLN
INTRAMUSCULAR | Status: DC | PRN
  Administered 2014-01-07: .6 mg via INTRAVENOUS

## 2014-01-07 MED ORDER — GLYCOPYRROLATE 0.2 MG/ML IJ SOLN
INTRAMUSCULAR | Status: AC
  Filled 2014-01-07: qty 3

## 2014-01-07 MED ORDER — PROPOFOL 10 MG/ML IV BOLUS
INTRAVENOUS | Status: DC | PRN
  Administered 2014-01-07: 150 mg via INTRAVENOUS

## 2014-01-07 MED ORDER — NEOSTIGMINE METHYLSULFATE 10 MG/10ML IV SOLN
INTRAVENOUS | Status: AC
  Filled 2014-01-07: qty 1

## 2014-01-07 MED ORDER — CEFAZOLIN SODIUM-DEXTROSE 2-3 GM-% IV SOLR
INTRAVENOUS | Status: DC | PRN
  Administered 2014-01-07: 2 g via INTRAVENOUS

## 2014-01-07 MED ORDER — ONDANSETRON HCL 4 MG/2ML IJ SOLN
INTRAMUSCULAR | Status: DC | PRN
  Administered 2014-01-07: 4 mg via INTRAVENOUS

## 2014-01-07 MED ORDER — HYDROMORPHONE HCL PF 1 MG/ML IJ SOLN: 0.2500 mg | INTRAMUSCULAR | Status: DC | PRN

## 2014-01-07 MED ORDER — LIDOCAINE HCL 4 % MT SOLN
OROMUCOSAL | Status: DC | PRN
  Administered 2014-01-07: 4 mL via TOPICAL

## 2014-01-07 MED ORDER — CEFAZOLIN SODIUM-DEXTROSE 2-3 GM-% IV SOLR
INTRAVENOUS | Status: AC
  Filled 2014-01-07: qty 50

## 2014-01-07 MED ORDER — DIPHENHYDRAMINE HCL 50 MG/ML IJ SOLN
INTRAMUSCULAR | Status: AC
  Filled 2014-01-07: qty 1

## 2014-01-07 MED ORDER — LIDOCAINE HCL (CARDIAC) 20 MG/ML IV SOLN
INTRAVENOUS | Status: AC
  Filled 2014-01-07: qty 5

## 2014-01-07 MED ORDER — FENTANYL CITRATE 0.05 MG/ML IJ SOLN
INTRAMUSCULAR | Status: AC
  Filled 2014-01-07: qty 5

## 2014-01-07 SURGICAL SUPPLY — 30 items
BRUSH CYTOL CELLEBRITY 1.5X140 (MISCELLANEOUS) ×3 IMPLANT
CANISTER SUCTION 2500CC (MISCELLANEOUS) ×3 IMPLANT
CONT SPEC 4OZ CLIKSEAL STRL BL (MISCELLANEOUS) ×6 IMPLANT
COTTONBALL LRG STERILE PKG (GAUZE/BANDAGES/DRESSINGS) IMPLANT
COVER TABLE BACK 60X90 (DRAPES) ×3 IMPLANT
DRSG AQUACEL AG ADV 3.5X14 (GAUZE/BANDAGES/DRESSINGS) ×3 IMPLANT
FORCEPS BIOP RJ4 1.8 (CUTTING FORCEPS) ×3 IMPLANT
GAUZE SPONGE 4X4 12PLY STRL (GAUZE/BANDAGES/DRESSINGS) ×3 IMPLANT
GLOVE BIOGEL PI IND STRL 7.0 (GLOVE) ×1 IMPLANT
GLOVE BIOGEL PI INDICATOR 7.0 (GLOVE) ×2
GLOVE ORTHO TXT STRL SZ7.5 (GLOVE) ×3 IMPLANT
GOWN STRL REUS W/ TWL XL LVL3 (GOWN DISPOSABLE) ×1 IMPLANT
GOWN STRL REUS W/TWL XL LVL3 (GOWN DISPOSABLE) ×2
KIT ROOM TURNOVER OR (KITS) ×3 IMPLANT
MARKER SKIN DUAL TIP RULER LAB (MISCELLANEOUS) ×6 IMPLANT
NEEDLE 22X1 1/2 (OR ONLY) (NEEDLE) IMPLANT
NEEDLE BIOPSY TRANSBRONCH 21G (NEEDLE) IMPLANT
NEEDLE BLUNT 18X1 FOR OR ONLY (NEEDLE) IMPLANT
NEEDLE SYS SONOTIP II EBUSTBNA (NEEDLE) ×3 IMPLANT
NS IRRIG 1000ML POUR BTL (IV SOLUTION) ×3 IMPLANT
OIL SILICONE PENTAX (PARTS (SERVICE/REPAIRS)) ×3 IMPLANT
PAD ARMBOARD 7.5X6 YLW CONV (MISCELLANEOUS) ×6 IMPLANT
SYR 20CC LL (SYRINGE) ×3 IMPLANT
SYR 20ML ECCENTRIC (SYRINGE) ×3 IMPLANT
SYR 5ML LUER SLIP (SYRINGE) ×3 IMPLANT
SYR CONTROL 10ML LL (SYRINGE) IMPLANT
TOWEL OR 17X24 6PK STRL BLUE (TOWEL DISPOSABLE) ×3 IMPLANT
TRAP SPECIMEN MUCOUS 40CC (MISCELLANEOUS) ×3 IMPLANT
TUBE CONNECTING 12'X1/4 (SUCTIONS) ×1
TUBE CONNECTING 12X1/4 (SUCTIONS) ×2 IMPLANT

## 2014-01-07 NOTE — Discharge Instructions (Signed)

## 2014-01-07 NOTE — Progress Notes (Signed)
The patient was examined and preop studies reviewed. There has been no change from the prior exam and the patient is ready for surgery.  Plan EBUS on M Borgmeyer today

## 2014-01-07 NOTE — Anesthesia Postprocedure Evaluation (Signed)
  Anesthesia Post-op Note  Patient: Biomedical scientist  Procedure(s) Performed: Procedure(s): VIDEO BRONCHOSCOPY WITH ENDOBRONCHIAL ULTRASOUND (N/A)  Patient Location: PACU  Anesthesia Type:General  Level of Consciousness: sedated  Airway and Oxygen Therapy: Patient Spontanous Breathing  Post-op Pain: mild  Post-op Assessment: Post-op Vital signs reviewed  Post-op Vital Signs: stable  Last Vitals:  Filed Vitals:   01/07/14 1115  BP: 122/57  Pulse: 81  Temp:   Resp: 18    Complications: Delayed emrgence

## 2014-01-07 NOTE — Brief Op Note (Signed)
01/07/2014  10:10 AM  PATIENT:  Erica Young  73 y.o. female  PRE-OPERATIVE DIAGNOSIS:  mediastinal adenopathy  POST-OPERATIVE DIAGNOSIS:  mediastinal adenopathy  PROCEDURE:  Procedure(s): VIDEO BRONCHOSCOPY WITH ENDOBRONCHIAL ULTRASOUND (N/A) Endobronchial bx and brushings of L lung  SURGEON:  Surgeon(s) and Role:    * Ivin Poot, MD - Primary  PHYSICIAN ASSISTANT:   ASSISTANTS: none   ANESTHESIA:   general  EBL:  Total I/O In: 1500 [I.V.:1500] Out: -   BLOOD ADMINISTERED:none  DRAINS: none   LOCAL MEDICATIONS USED:  NONE  SPECIMEN:  Biopsy / Limited Resection and Scraping level 7 Nodes and L bronchus brushings  DISPOSITION OF SPECIMEN:  PATHOLOGY  COUNTS:  YES  TOURNIQUET:  * No tourniquets in log *  DICTATION: .Dragon Dictation  PLAN OF CARE: Discharge to home after PACU  PATIENT DISPOSITION:  PACU - hemodynamically stable.   Delay start of Pharmacological VTE agent (>24hrs) due to surgical blood loss or risk of bleeding: yes

## 2014-01-07 NOTE — Progress Notes (Signed)
Pt extubated per DR Tobias Alexander, on room air sating 94

## 2014-01-07 NOTE — Transfer of Care (Signed)
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 Taken to PACU intubated. Placed on PS 5 per RT.

## 2014-01-07 NOTE — Anesthesia Preprocedure Evaluation (Addendum)
Anesthesia Evaluation  Patient identified by MRN, date of birth, ID band Patient awake    Reviewed: Allergy & Precautions, H&P , NPO status , Patient's Chart, lab work & pertinent test results  History of Anesthesia Complications Negative for: history of anesthetic complications  Airway Mallampati: II TM Distance: >3 FB Neck ROM: Full    Dental  (+) Loose, Dental Advisory Given   Pulmonary shortness of breath, former smoker,    Pulmonary exam normal       Cardiovascular hypertension, Pt. on medications     Neuro/Psych CVA    GI/Hepatic negative GI ROS, Neg liver ROS,   Endo/Other  Hypothyroidism   Renal/GU negative Renal ROS     Musculoskeletal   Abdominal   Peds  Hematology   Anesthesia Other Findings   Reproductive/Obstetrics                         Anesthesia Physical Anesthesia Plan  ASA: III  Anesthesia Plan: General   Post-op Pain Management:    Induction: Intravenous  Airway Management Planned: Oral ETT  Additional Equipment:   Intra-op Plan:   Post-operative Plan: Extubation in OR  Informed Consent: I have reviewed the patients History and Physical, chart, labs and discussed the procedure including the risks, benefits and alternatives for the proposed anesthesia with the patient or authorized representative who has indicated his/her understanding and acceptance.   Dental advisory given  Plan Discussed with: CRNA, Anesthesiologist and Surgeon  Anesthesia Plan Comments:        Anesthesia Quick Evaluation

## 2014-01-08 NOTE — Op Note (Signed)
NAMEAKIRRA, LACERDA NO.:  [id number]  MEDICAL RECORD NO.:  [id number]  LOCATION:                                FACILITY:  [facility]  PHYSICIAN:  Ivin Poot, M.D.  DATE OF BIRTH:  1940/05/07  DATE OF PROCEDURE: DATE OF DISCHARGE:  01/07/2014                              OPERATIVE REPORT   OPERATIONS: 1. Endobronchial ultrasound with biopsy of mediastinal lymph nodes. 2. Video-bronchoscopy with endobronchial biopsy and brushings of the     left mainstem bronchus.  SURGEON:  Ivin Poot, M.D.  PREOPERATIVE DIAGNOSIS:  Mediastinal adenopathy with hypermetabolic activity on PET scan.  POSTOPERATIVE DIAGNOSIS:  Mediastinal adenopathy with hypermetabolic activity on PET scan.  ANESTHESIA:  General.  PROCEDURE IN DETAIL:  After informed consent was obtained and the indications, benefits, and risks of surgery were discussed in detail with the patient and her husband, the patient was brought back to the operating room and placed supine on the operating table.  General anesthesia was induced.  A proper time-out was performed.  The video bronchoscope was passed.  The distal trachea and carina were examined. There were some granular fibrinous exudate over the bronchial mucosa proximally, left greater than right mainstem bronchus.  These were irrigated and cultures were taken as well as cytologies.  The bronchoscope was passed down the endobronchial segments of the left upper lobe and left lower lobe.  There were no discrete endobronchial lesions noted.  Biopsy of the granular exudate over the bronchus was obtained.  The bronchoscope was then passed on the right mainstem bronchus and the endobronchial segments of the right upper lobe, right middle lobe, and right lower lobe.  There were no discrete endobronchial lesions noted.  The bronchial mucosa in both lungs was friable and bled easily with the bronchoscopic exam.  The bronchoscope was removed.  The  EBUS scope was then inserted.  Just past the carina, the subcarinal lymph node station 7 was identified as having enlarged lymph nodes. Several passes with the transbronchial needle using ultrasound guidance were performed of this area.  Quick prep of the first 2-3 passes was sent and analyzed by Pathology. 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  After the transbronchial ultrasound-guided biopsies of the mediastinal nodes were completed, the EBUS scope was removed.  There was no significant bleeding.  The patient was reversed from anesthesia, but this took some time, and she was transferred back to the recovery room still intubated under the care of the Anesthesia Service for later extubation.     Ivin Poot, M.D.     PV/MEDQ  D:  01/07/2014  T:  01/08/2014  Job:  [id number]  cc:   Eilleen Kempf, M.D.

## 2014-01-10 LAB — CULTURE, RESPIRATORY W GRAM STAIN

## 2014-01-10 LAB — CULTURE, RESPIRATORY

## 2014-01-11 ENCOUNTER — Ambulatory Visit (INDEPENDENT_AMBULATORY_CARE_PROVIDER_SITE_OTHER): Payer: Medicare Other | Admitting: Cardiothoracic Surgery

## 2014-01-11 ENCOUNTER — Encounter (HOSPITAL_COMMUNITY): Payer: Self-pay | Admitting: Cardiothoracic Surgery

## 2014-01-11 VITALS — BP 137/83 | HR 84 | Ht 62.0 in | Wt 129.0 lb

## 2014-01-11 DIAGNOSIS — R590 Localized enlarged lymph nodes: Secondary | ICD-10-CM

## 2014-01-11 DIAGNOSIS — Z853 Personal history of malignant neoplasm of breast: Secondary | ICD-10-CM | POA: Diagnosis not present

## 2014-01-11 DIAGNOSIS — R599 Enlarged lymph nodes, unspecified: Secondary | ICD-10-CM

## 2014-01-11 NOTE — Progress Notes (Signed)
PCP is [name], MD Referring Provider is Curt Bears, MD  Chief Complaint  Patient presents with  . F/U THORACIC    F/U VISIT    JJO:ACZYSAY returns to office to discuss brochoscopic biopsy ( EBUS) of mediastinal nodes which are hypermetobolic on PET  Path/Cytology d/w pathologist Dr Sheffield Slider path shows adenocarcinoma c/w breast primary- ER positive TTF-1 negative.  Patient will followup with her oncologist for further therapy- Dr Julien Nordmann   Past Medical History  Diagnosis Date  . HYPERLIPIDEMIA 03/03/2010  . CEREBROVASCULAR DISEASE 03/03/2010  . DIVERTICULOSIS, COLON 03/03/2010  . Low back pain   . Right leg pain   . History of cerebral artery stenosis     right middle  . Mastoiditis     noted on brain MRI  . Foramen ovale     positive bubble study  . Diverticulitis   . Fatty liver 08/02/09    as per U/S done by [facility]  . Internal hemorrhoid   . Anxiety   . Hypertension   . Ulcer   . Stroke Oct. 2011    TIA  . HYPOTHYROIDISM 03/03/2010    no longer on meds  . Cancer 2007    right breat ca  , lumpectomy and radiation tx (declined chemo and additional prophylactic meds)  . Shortness of breath   . Headache(784.0)     she thinks sinus headaches  . Rib fracture     Past Surgical History  Procedure Laterality Date  . Cholecystectomy    . Breast lumpectomy      right  . Tonsillectomy    . Colonoscopy    . Tubal ligation    . Shoulder surgery      right shoulder (dislocation)  . Video bronchoscopy with endobronchial ultrasound N/A 01/07/2014    Procedure: VIDEO BRONCHOSCOPY WITH ENDOBRONCHIAL ULTRASOUND;  Surgeon: Ivin Poot, MD;  Location: [facility] OR;  Service: Thoracic;  Laterality: N/A;    Family History  Problem Relation Age of Onset  . Heart disease Sister     cerebral vascular disease also  . Heart disease Brother     cerebral vascular disease also  . Heart disease Brother     cerebral vascular disease  . Heart  disease Sister     cebreal vascular disease also  . Heart disease Sister     cebreal vascular diease also  . Heart disease Sister     cerebral vascular diease also  . Colon cancer Neg Hx   . Esophageal cancer Neg Hx   . Rectal cancer Neg Hx   . Stomach cancer Father     Social History History  Substance Use Topics  . Smoking status: Former Smoker -- 0.50 packs/day for 20 years    Types: Cigarettes    Quit date: 05/01/1991  . Smokeless tobacco: Never Used  . Alcohol Use: Yes     Comment: socially    Current Outpatient Prescriptions  Medication Sig Dispense Refill  . ALPRAZolam (XANAX) 0.25 MG tablet TAKE 1 TABLET BY MOUTH EVERY 8 HOURS      . amLODipine (NORVASC) 2.5 MG tablet Take 2.5 mg by mouth daily.      Marland Kitchen aspirin EC 81 MG tablet Take 81 mg by mouth daily.      . cetirizine (ZYRTEC) 10 MG tablet Take 10 mg by mouth daily.      Marland Kitchen dicyclomine (BENTYL) 10 MG capsule Take 10 mg by mouth 3 (three) times daily as needed for  spasms.       . Flaxseed, Linseed, (GROUND FLAX SEEDS PO) Take by mouth. Pt states she takes 2 tablespoons a day.      . Multiple Vitamin (MULTIVITAMIN WITH MINERALS) TABS tablet Take 1 tablet by mouth daily.      . Probiotic Product (PROBIOTIC DAILY PO) Take 1 capsule by mouth once. Takes 1 a day      . vitamin B-12 (CYANOCOBALAMIN) 100 MCG tablet Take 100 mcg by mouth daily.       No current facility-administered medications for this visit.    Allergies  Allergen Reactions  . Ciprofloxacin Anaphylaxis    Review of Systems Cough is much better after bronch  BP 137/83  Pulse 84  Ht 5\' 2"  (1.575 m)  Wt 129 lb (58.514 kg)  BMI 23.59 kg/m2  SpO2 96% Physical Exam Comfortable- accompanied by husband  Diagnostic Tests: Results of path d/w patient, spouse  Impression: Metastatic breast cancer s/p prior lumpectomy in [place]  Plan:f/u with oncology

## 2014-01-14 ENCOUNTER — Ambulatory Visit: Payer: Medicare Other | Admitting: Internal Medicine

## 2014-01-14 ENCOUNTER — Telehealth: Payer: Self-pay | Admitting: Internal Medicine

## 2014-01-14 NOTE — Telephone Encounter (Signed)
s.w. pt and advised on appt change...ok adn aware....pt is currently in the hospital and my not need to keep that appt.Marland Kitchen

## 2014-01-21 ENCOUNTER — Ambulatory Visit: Payer: Medicare Other | Admitting: Internal Medicine

## 2014-01-22 ENCOUNTER — Encounter: Payer: Self-pay | Admitting: Internal Medicine

## 2014-01-22 ENCOUNTER — Ambulatory Visit: Payer: Medicare Other | Admitting: Internal Medicine

## 2014-01-22 ENCOUNTER — Ambulatory Visit (HOSPITAL_BASED_OUTPATIENT_CLINIC_OR_DEPARTMENT_OTHER): Payer: Medicare Other | Admitting: Internal Medicine

## 2014-01-22 VITALS — BP 142/73 | HR 83 | Temp 98.1°F | Resp 20 | Ht 62.0 in | Wt 127.7 lb

## 2014-01-22 DIAGNOSIS — J310 Chronic rhinitis: Secondary | ICD-10-CM | POA: Diagnosis not present

## 2014-01-22 DIAGNOSIS — C7951 Secondary malignant neoplasm of bone: Secondary | ICD-10-CM

## 2014-01-22 DIAGNOSIS — R1031 Right lower quadrant pain: Secondary | ICD-10-CM | POA: Diagnosis not present

## 2014-01-22 DIAGNOSIS — G8929 Other chronic pain: Secondary | ICD-10-CM | POA: Diagnosis not present

## 2014-01-22 DIAGNOSIS — C781 Secondary malignant neoplasm of mediastinum: Secondary | ICD-10-CM | POA: Diagnosis not present

## 2014-01-22 DIAGNOSIS — K5732 Diverticulitis of large intestine without perforation or abscess without bleeding: Secondary | ICD-10-CM | POA: Diagnosis not present

## 2014-01-22 DIAGNOSIS — R109 Unspecified abdominal pain: Secondary | ICD-10-CM | POA: Diagnosis not present

## 2014-01-22 DIAGNOSIS — C50919 Malignant neoplasm of unspecified site of unspecified female breast: Secondary | ICD-10-CM

## 2014-01-22 DIAGNOSIS — I679 Cerebrovascular disease, unspecified: Secondary | ICD-10-CM | POA: Diagnosis not present

## 2014-01-22 DIAGNOSIS — C7952 Secondary malignant neoplasm of bone marrow: Secondary | ICD-10-CM

## 2014-01-22 DIAGNOSIS — I10 Essential (primary) hypertension: Secondary | ICD-10-CM | POA: Diagnosis not present

## 2014-01-22 DIAGNOSIS — E785 Hyperlipidemia, unspecified: Secondary | ICD-10-CM | POA: Diagnosis not present

## 2014-01-22 DIAGNOSIS — L84 Corns and callosities: Secondary | ICD-10-CM | POA: Diagnosis not present

## 2014-01-22 DIAGNOSIS — S20219A Contusion of unspecified front wall of thorax, initial encounter: Secondary | ICD-10-CM | POA: Diagnosis not present

## 2014-01-22 DIAGNOSIS — R599 Enlarged lymph nodes, unspecified: Secondary | ICD-10-CM | POA: Diagnosis not present

## 2014-01-22 DIAGNOSIS — C50911 Malignant neoplasm of unspecified site of right female breast: Secondary | ICD-10-CM

## 2014-01-22 DIAGNOSIS — E039 Hypothyroidism, unspecified: Secondary | ICD-10-CM | POA: Diagnosis not present

## 2014-01-22 DIAGNOSIS — K5900 Constipation, unspecified: Secondary | ICD-10-CM | POA: Diagnosis not present

## 2014-01-22 DIAGNOSIS — R1032 Left lower quadrant pain: Secondary | ICD-10-CM | POA: Diagnosis not present

## 2014-01-22 DIAGNOSIS — R634 Abnormal weight loss: Secondary | ICD-10-CM | POA: Diagnosis not present

## 2014-01-22 MED ORDER — ANASTROZOLE 1 MG PO TABS: 1.0000 mg | ORAL_TABLET | Freq: Every day | ORAL | Status: DC

## 2014-01-22 NOTE — Progress Notes (Signed)
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 The patient has a history of right breast carcinoma status post lumpectomy followed by radiotherapy in 2007 diagnosed in [place] and the patient declined any further treatment at that time.   PRIOR THERAPY: None  CURRENT THERAPY: Arimidex 1 mg by mouth daily. Started 01/22/2014  INTERVAL HISTORY: Erica Young 73 y.o. female returns to the clinic today for followup visit accompanied by husband and son. The patient was seen for initial evaluation a month ago when she presented with mediastinal lymphadenopathy of unknown etiology. I ordered a PET scan at that time and also refer the patient to Dr. Prescott Gum for tissue diagnosis. She underwent endobronchial ultrasound with biopsy of the mediastinal lymph nodes as well as video bronchoscopy with endobronchial biopsy and brushings of the left mainstem bronchus. The final pathology from the left mainstem bronchus (Accession: [id number]) showed adenocarcinoma with abundant extra cellular mucin. Immunohistochemical stains are performed. The tumor is positive for cytokeratin 7 and estrogen receptor. Gross cystic disease fluid protein demonstrates weak equivocal staining. The tumor is negative for TTF-1, cytokeratin 20 and CDX-2. The staining pattern favors primary adenocarcinoma of breast primary source. the staining pattern of the tumor favors a well differentiated adenocarcinoma of breast primary source with abundant extracellular mucin (i.e. ductal carcinoma with abundant extracellular mucin). The PET scan showed new right internal mammary nodal metastasis in addition to development of extensive osseous metastases with presumed pathologic fracture involving the posterior left  fifth rib. There was also similar hypermetabolism involving the low right neck likely due to hypermetabolic thyroid nodule. The patient is feeling fine today with no specific complaints. She denied having any significant chest pain, shortness breath, cough or hemoptysis. She denied having any significant weight loss or night sweats. She is here for evaluation and discussion of her treatment options based on the new findings of the PET scan as well as the recent biopsy.  MEDICAL HISTORY: Past Medical History  Diagnosis Date  . HYPERLIPIDEMIA 03/03/2010  . CEREBROVASCULAR DISEASE 03/03/2010  . DIVERTICULOSIS, COLON 03/03/2010  . Low back pain   . Right leg pain   . History of cerebral artery stenosis     right middle  . Mastoiditis     noted on brain MRI  . Foramen ovale     positive bubble study  . Diverticulitis   . Fatty liver 08/02/09    as per U/S done by [facility]  . Internal hemorrhoid   . Anxiety   . Hypertension   . Ulcer   . Stroke Oct. 2011    TIA  . HYPOTHYROIDISM 03/03/2010    no longer on meds  . Cancer 2007    right breat ca  , lumpectomy and radiation tx (declined chemo and additional prophylactic meds)  . Shortness of breath   . Headache(784.0)     she thinks sinus headaches  . Rib fracture     ALLERGIES:  is allergic to ciprofloxacin.  MEDICATIONS:  Current Outpatient Prescriptions  Medication Sig Dispense Refill  . ALPRAZolam (XANAX) 0.25 MG tablet TAKE 1 TABLET BY MOUTH EVERY 8 HOURS      . amLODipine (NORVASC) 2.5 MG tablet Take 2.5 mg by mouth daily.      Marland Kitchen aspirin EC 81 MG tablet Take  81 mg by mouth daily.      . cetirizine (ZYRTEC) 10 MG tablet Take 10 mg by mouth daily.      Marland Kitchen dicyclomine (BENTYL) 10 MG capsule Take 10 mg by mouth 3 (three) times daily as needed for spasms.       . Flaxseed, Linseed, (GROUND FLAX SEEDS PO) Take by mouth. Pt states she takes 2 tablespoons a day.      . Multiple Vitamin (MULTIVITAMIN WITH MINERALS) TABS  tablet Take 1 tablet by mouth daily.      . Probiotic Product (PROBIOTIC DAILY PO) Take 1 capsule by mouth once. Takes 1 a day      . vitamin B-12 (CYANOCOBALAMIN) 100 MCG tablet Take 5,000 mcg by mouth daily.        No current facility-administered medications for this visit.    SURGICAL HISTORY:  Past Surgical History  Procedure Laterality Date  . Cholecystectomy    . Breast lumpectomy      right  . Tonsillectomy    . Colonoscopy    . Tubal ligation    . Shoulder surgery      right shoulder (dislocation)  . Video bronchoscopy with endobronchial ultrasound N/A 01/07/2014    Procedure: VIDEO BRONCHOSCOPY WITH ENDOBRONCHIAL ULTRASOUND;  Surgeon: Ivin Poot, MD;  Location: [facility] OR;  Service: Thoracic;  Laterality: N/A;    REVIEW OF SYSTEMS:  Constitutional: negative Eyes: negative Ears, nose, mouth, throat, and face: negative Respiratory: negative Cardiovascular: negative Gastrointestinal: negative Genitourinary:negative Integument/breast: negative Hematologic/lymphatic: negative Musculoskeletal:negative Neurological: negative Behavioral/Psych: negative Endocrine: negative Allergic/Immunologic: negative   PHYSICAL EXAMINATION: General appearance: alert, cooperative and no distress Head: Normocephalic, without obvious abnormality, atraumatic Neck: no adenopathy, no JVD, supple, symmetrical, trachea midline and thyroid not enlarged, symmetric, no tenderness/mass/nodules Lymph nodes: Cervical, supraclavicular, and axillary nodes normal. Resp: clear to auscultation bilaterally Back: symmetric, no curvature. ROM normal. No CVA tenderness. Cardio: regular rate and rhythm, S1, S2 normal, no murmur, click, rub or gallop GI: soft, non-tender; bowel sounds normal; no masses,  no organomegaly Extremities: extremities normal, atraumatic, no cyanosis or edema Neurologic: Alert and oriented X 3, normal strength and tone. Normal symmetric reflexes. Normal coordination and gait  ECOG  PERFORMANCE STATUS: 1 - Symptomatic but completely ambulatory  Blood pressure 142/73, pulse 83, temperature 98.1 F (36.7 C), temperature source Oral, resp. rate 20, height '5\' 2"'  (1.575 m), weight 127 lb 11.2 oz (57.924 kg).  LABORATORY DATA: Lab Results  Component Value Date   WBC 4.9 01/06/2014   HGB 13.5 01/06/2014   HCT 39.1 01/06/2014   MCV 94.9 01/06/2014   PLT 170 01/06/2014      Chemistry      Component Value Date/Time   NA 138 01/06/2014 0910   NA 137 12/22/2013 1401   K 4.3 01/06/2014 0910   K 4.0 12/22/2013 1401   CL 104 01/06/2014 0910   CO2 22 01/06/2014 0910   CO2 26 12/22/2013 1401   BUN 9 01/06/2014 0910   BUN 9.9 12/22/2013 1401   CREATININE 0.56 01/06/2014 0910   CREATININE 0.7 12/22/2013 1401      Component Value Date/Time   CALCIUM 9.0 01/06/2014 0910   CALCIUM 9.3 12/22/2013 1401   ALKPHOS 87 01/06/2014 0910   ALKPHOS 86 12/22/2013 1401   AST 17 01/06/2014 0910   AST 20 12/22/2013 1401   ALT 16 01/06/2014 0910   ALT 17 12/22/2013 1401   BILITOT 0.3 01/06/2014 0910   BILITOT 0.39 12/22/2013 1401  RADIOGRAPHIC STUDIES: Dg Chest 2 View Within Previous 72 Hours.  Films Obtained On Friday Are Acceptable For Monday And Tuesday Cases  01/06/2014   CLINICAL DATA:  Lymphadenopathy and patient with history of breast cancer. Patient for VATS.  EXAM: CHEST  2 VIEW  COMPARISON:  PET CT scan 12/28/2013. CT chest 0 1/ 09/2013. PA and lateral chest 06/19/2013.  FINDINGS: Mild linear atelectasis is seen in the right lung base. The lungs are otherwise clear. Scar at the left costophrenic angle is unchanged. Mediastinal and hilar adenopathy is better demonstrated on cross-sectional imaging. Heart size is normal. Surgical clips right axilla are noted.  IMPRESSION: No acute disease.   Electronically Signed   By: Inge Rise M.D.   On: 01/06/2014 09:52   Nm Pet Image Restag (ps) Skull Base To Thigh  12/28/2013   CLINICAL DATA:  Subsequent treatment strategy for restaging of hilar adenopathy. History  of breast cancer.  EXAM: NUCLEAR MEDICINE PET SKULL BASE TO THIGH  TECHNIQUE: 6.3 MCi F-18 FDG was injected intravenously. Full-ring PET imaging was performed from the skull base to thigh after the radiotracer. CT data was obtained and used for attenuation correction and anatomic localization.  FASTING BLOOD GLUCOSE:  Value: 118 mg/dl  COMPARISON:  05/21/2013 PET. Interval abdominal pelvic CT of 11/18/2013.  FINDINGS: NECK  Low right cervical hypermetabolism, likely corresponding to the hypermetabolic thyroid nodule biopsied on 06/02/2012. This measures a S.U.V. max of 8.8. On the prior exam this measured a S.U.V. max of 9.4.  CHEST  Hypermetabolic thoracic adenopathy. A right internal mammary node measures 7 mm and a S.U.V. max of 3.8 on image 62. This is new.  Precarinal adenopathy measures 2.0 cm short axis and a S.U.V. max of 5.8 on image 65. On the prior exam, nodes in this area measured similar in size and a S.U.V. max of 8.2.  Subcarinal adenopathy measures 2.0 cm and a S.U.V. max of 5.5 on image 73. 2.1 cm and a S.U.V. max of 11.1 on the prior. Mild hypermetabolism corresponding to left-sided pleural thickening. This measures a S.U.V. max of 2.1 on image 76.  ABDOMEN/PELVIS  No areas of abnormal hypermetabolism.  SKELETON  Development of extensive osseous metastasis. Index lesion within the T7 vertebral body measures a S.U.V. max of 10.0. There are also lesions within L1 and L5. Hypermetabolism corresponding to a posterior left fifth rib fracture, likely pathologic. New.  Right proximal femoral lesion measures a S.U.V. max of 12.0 and corresponds to cortical thinning laterally on image 208.  CT IMAGES PERFORMED FOR ATTENUATION CORRECTION  Mild cardiomegaly with coronary artery atherosclerosis. Trace pericardial fluid is unchanged. Right base dependent atelectasis. Subpleural lingular opacity is also favored to represent atelectasis. Cholecystectomy. Pelvic floor laxity.  IMPRESSION: 1. Mixed response to  therapy within the chest. Some nodes are decreased in hypermetabolism. However, there is new right internal mammary nodal metastasis. 2. Development of extensive osseous metastasis with presumed pathologic fracture involving the posterior left fifth rib. 3. Similar hypermetabolism involving the low right neck, likely due to a hypermetabolic thyroid nodule.   Electronically Signed   By: Abigail Miyamoto M.D.   On: 12/28/2013 15:08   Chest Port 1 View  01/07/2014   CLINICAL DATA:  Postoperative videoscopic thoracic evaluation; history of breast carcinoma  EXAM: PORTABLE CHEST - 1 VIEW  COMPARISON:  January 06, 2014  FINDINGS: There is no appreciable pneumothorax. There is persistent elevation the right hemidiaphragm with right base atelectasis. There is mild scarring in the left  base. Elsewhere lungs are clear. Heart size and pulmonary vascularity are normal. There is no appreciable adenopathy. There is postoperative change on the right with surgical clips the right axillary region.  IMPRESSION: Atelectasis right base. Scarring left base. Stable elevation right hemidiaphragm. No frank edema or consolidation. No pneumothorax. No change in cardiac silhouette.   Electronically Signed   By: Lowella Grip M.D.   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 Her disease is mainly in the bone as well as the mediastinum. The patient is currently asymptomatic and has no visceral crisis. I had a lengthy discussion with the patient and her family today about her current disease stage, prognosis and treatment options. I will call the pathology Department to order the prognostic factor for her recently diagnosed with metastatic breast cancer including percentage of estrogen, progesterone and HER-2 receptors. I recommended for the patient treatment with hormonal therapy with Arimidex 1 mg by  mouth daily. I discussed the adverse effect of this treatment with the patient including but not limited to osteoporosis, hot flashes, edema and depression. I will send the prescription to her pharmacy today and I expect the patient to start this treatment within the next 1- 2 days. Other treatment options that could be considered for this patient include treatment with letrozole and Ibrance (Palbociclib). I would consider this treatment if she has intolerance to Arimidex.  2) Metastatic bone disease: I discussed with the patient in the treatment with Xgeva. I asked the patient to see her dentist for dental clearance before starting this treatment because of the concern for the osteonecrosis of the jaw. I also advised the patient to take a daily calcium and vitamin D supplements.  3) followup visit: The patient would come back for followup visit in 2 weeks for evaluation and management any adverse effect of her treatment. I gave the patient and her family the time to ask questions and answers and completed to their satisfaction. She was advised to call immediately if she has any concerning symptoms in the interval.  The patient voices understanding of current disease status and treatment options and is in agreement with the current care plan.  All questions were answered. The patient knows to call the clinic with any problems, questions or concerns. We can certainly see the patient much sooner if necessary.  I spent 20 minutes counseling the patient face to face. The total time spent in the appointment was 30 minutes.  Disclaimer: This note was dictated with voice recognition software. Similar sounding words can inadvertently be transcribed and may not be corrected upon review.

## 2014-01-23 DIAGNOSIS — C50911 Malignant neoplasm of unspecified site of right female breast: Secondary | ICD-10-CM | POA: Insufficient documentation

## 2014-01-23 DIAGNOSIS — C50919 Malignant neoplasm of unspecified site of unspecified female breast: Secondary | ICD-10-CM | POA: Insufficient documentation

## 2014-01-26 ENCOUNTER — Telehealth: Payer: Self-pay | Admitting: Internal Medicine

## 2014-01-26 NOTE — Telephone Encounter (Signed)
s.w. pt and advised on OCT appt....pt ok and aware °

## 2014-02-01 ENCOUNTER — Ambulatory Visit (INDEPENDENT_AMBULATORY_CARE_PROVIDER_SITE_OTHER): Payer: Medicare Other | Admitting: Internal Medicine

## 2014-02-01 ENCOUNTER — Encounter: Payer: Self-pay | Admitting: Internal Medicine

## 2014-02-01 VITALS — BP 130/80 | HR 92 | Temp 97.8°F | Resp 18 | Ht 62.0 in | Wt 129.0 lb

## 2014-02-01 DIAGNOSIS — Z23 Encounter for immunization: Secondary | ICD-10-CM | POA: Diagnosis not present

## 2014-02-01 MED ORDER — ALPRAZOLAM 0.25 MG PO TABS
0.2500 mg | ORAL_TABLET | Freq: Three times a day (TID) | ORAL | Status: DC | PRN
Start: 2014-02-01 — End: 2014-02-18

## 2014-02-01 NOTE — Patient Instructions (Signed)
Limit your sodium (Salt) intake  Please check your blood pressure on a regular basis.  If it is consistently greater than 150/90, please make an office appointment.  Return in 3 months for follow-up  

## 2014-02-01 NOTE — Progress Notes (Signed)
Subjective:    Patient ID: Erica Young, female    DOB: 06-08-1940, 73 y.o.   MRN: [id number]  HPI  73 year old patient who is now followed by oncology for stage IV breast cancer.  Repeat PET scan revealed progression of mediastinal disease as well as bone disease. She now is on arimidex and considering treatment with prolia and Xgeva. She is back on amlodipine for blood pressure control and off statin therapy (prescribed.  Due to cerebrovascular disease).  Many questions and concerns were addressed  Past Medical History  Diagnosis Date  . HYPERLIPIDEMIA 03/03/2010  . CEREBROVASCULAR DISEASE 03/03/2010  . DIVERTICULOSIS, COLON 03/03/2010  . Low back pain   . Right leg pain   . History of cerebral artery stenosis     right middle  . Mastoiditis     noted on brain MRI  . Foramen ovale     positive bubble study  . Diverticulitis   . Fatty liver 08/02/09    as per U/S done by [facility]  . Internal hemorrhoid   . Anxiety   . Hypertension   . Ulcer   . Stroke Oct. 2011    TIA  . HYPOTHYROIDISM 03/03/2010    no longer on meds  . Cancer 2007    right breat ca  , lumpectomy and radiation tx (declined chemo and additional prophylactic meds)  . Shortness of breath   . Headache(784.0)     she thinks sinus headaches  . Rib fracture     History   Social History  . Marital Status: Married    Spouse Name: N/A    Number of Children: 2  . Years of Education: N/A   Occupational History  . Social Worker--Retired    Social History Main Topics  . Smoking status: Former Smoker -- 0.50 packs/day for 20 years    Types: Cigarettes    Quit date: 05/01/1991  . Smokeless tobacco: Never Used  . Alcohol Use: Yes     Comment: socially  . Drug Use: No  . Sexual Activity: Yes   Other Topics Concern  . Not on file   Social History Narrative   Daily caffeine     Past Surgical History  Procedure Laterality Date  . Cholecystectomy    . Breast lumpectomy      right  .  Tonsillectomy    . Colonoscopy    . Tubal ligation    . Shoulder surgery      right shoulder (dislocation)  . Video bronchoscopy with endobronchial ultrasound N/A 01/07/2014    Procedure: VIDEO BRONCHOSCOPY WITH ENDOBRONCHIAL ULTRASOUND;  Surgeon: Ivin Poot, MD;  Location: [facility] OR;  Service: Thoracic;  Laterality: N/A;    Family History  Problem Relation Age of Onset  . Heart disease Sister     cerebral vascular disease also  . Heart disease Brother     cerebral vascular disease also  . Heart disease Brother     cerebral vascular disease  . Heart disease Sister     cebreal vascular disease also  . Heart disease Sister     cebreal vascular diease also  . Heart disease Sister     cerebral vascular diease also  . Colon cancer Neg Hx   . Esophageal cancer Neg Hx   . Rectal cancer Neg Hx   . Stomach cancer Father     Allergies  Allergen Reactions  . Ciprofloxacin Anaphylaxis    Current Outpatient Prescriptions on File Prior to Visit  Medication Sig Dispense Refill  . ALPRAZolam (XANAX) 0.25 MG tablet TAKE 1 TABLET BY MOUTH EVERY 8 HOURS      . amLODipine (NORVASC) 2.5 MG tablet Take 2.5 mg by mouth daily.      Marland Kitchen anastrozole (ARIMIDEX) 1 MG tablet Take 1 tablet (1 mg total) by mouth daily.  30 tablet  6  . aspirin EC 81 MG tablet Take 81 mg by mouth daily.      . cetirizine (ZYRTEC) 10 MG tablet Take 10 mg by mouth daily.      Marland Kitchen dicyclomine (BENTYL) 10 MG capsule Take 10 mg by mouth 3 (three) times daily as needed for spasms.       . Flaxseed, Linseed, (GROUND FLAX SEEDS PO) Take by mouth. Pt states she takes 2 tablespoons a day.      . Multiple Vitamin (MULTIVITAMIN WITH MINERALS) TABS tablet Take 1 tablet by mouth daily.      . Probiotic Product (PROBIOTIC DAILY PO) Take 1 capsule by mouth once. Takes 1 a day      . vitamin B-12 (CYANOCOBALAMIN) 100 MCG tablet Take 5,000 mcg by mouth daily.        No current facility-administered medications on file prior to visit.     BP 130/80  Pulse 92  Temp(Src) 97.8 F (36.6 C) (Oral)  Resp 18  Ht 5\' 2"  (1.575 m)  Wt 129 lb (58.514 kg)  BMI 23.59 kg/m2  SpO2 96%     Review of Systems  Constitutional: Negative.   HENT: Negative for congestion, dental problem, hearing loss, rhinorrhea, sinus pressure, sore throat and tinnitus.   Eyes: Negative for pain, discharge and visual disturbance.  Respiratory: Negative for cough and shortness of breath.   Cardiovascular: Positive for chest pain. Negative for palpitations and leg swelling.  Gastrointestinal: Negative for nausea, vomiting, abdominal pain, diarrhea, constipation, blood in stool and abdominal distention.  Genitourinary: Negative for dysuria, urgency, frequency, hematuria, flank pain, vaginal bleeding, vaginal discharge, difficulty urinating, vaginal pain and pelvic pain.  Musculoskeletal: Positive for back pain. Negative for arthralgias, gait problem and joint swelling.  Skin: Negative for rash.  Neurological: Negative for dizziness, syncope, speech difficulty, weakness, numbness and headaches.  Hematological: Negative for adenopathy.  Psychiatric/Behavioral: Negative for behavioral problems, dysphoric mood and agitation. The patient is not nervous/anxious.        Objective:   Physical Exam  Constitutional: She is oriented to person, place, and time. She appears well-developed and well-nourished.  Blood pressure 120/80  HENT:  Head: Normocephalic.  Right Ear: External ear normal.  Left Ear: External ear normal.  Mouth/Throat: Oropharynx is clear and moist.  Eyes: Conjunctivae and EOM are normal. Pupils are equal, round, and reactive to light.  Neck: Normal range of motion. Neck supple. No thyromegaly present.  Cardiovascular: Normal rate, regular rhythm, normal heart sounds and intact distal pulses.   Pulmonary/Chest: Effort normal and breath sounds normal.  Abdominal: Soft. Bowel sounds are normal. She exhibits no mass. There is no tenderness.   Musculoskeletal: Normal range of motion.  Lymphadenopathy:    She has no cervical adenopathy.  Neurological: She is alert and oriented to person, place, and time.  Skin: Skin is warm and dry. No rash noted.  Psychiatric: She has a normal mood and affect. Her behavior is normal.          Assessment & Plan:   Stage IV breast cancer.  Followup oncology Hypertension stable Anxiety disorder.  Alprazolam refilled  Flu vaccine and  Prevnar 13 dispensed

## 2014-02-01 NOTE — Progress Notes (Signed)
Pre visit review using our clinic review tool, if applicable. No additional management support is needed unless otherwise documented below in the visit note. 

## 2014-02-05 LAB — FUNGUS CULTURE W SMEAR: Fungal Smear: NONE SEEN

## 2014-02-09 ENCOUNTER — Encounter: Payer: Self-pay | Admitting: Physician Assistant

## 2014-02-09 ENCOUNTER — Telehealth: Payer: Self-pay | Admitting: Physician Assistant

## 2014-02-09 ENCOUNTER — Other Ambulatory Visit (HOSPITAL_BASED_OUTPATIENT_CLINIC_OR_DEPARTMENT_OTHER): Payer: Medicare Other

## 2014-02-09 ENCOUNTER — Ambulatory Visit (HOSPITAL_BASED_OUTPATIENT_CLINIC_OR_DEPARTMENT_OTHER): Payer: Medicare Other | Admitting: Physician Assistant

## 2014-02-09 VITALS — BP 135/75 | HR 77 | Temp 98.0°F | Resp 20 | Ht 62.0 in | Wt 128.7 lb

## 2014-02-09 DIAGNOSIS — C50911 Malignant neoplasm of unspecified site of right female breast: Secondary | ICD-10-CM

## 2014-02-09 DIAGNOSIS — C7800 Secondary malignant neoplasm of unspecified lung: Secondary | ICD-10-CM

## 2014-02-09 DIAGNOSIS — C7951 Secondary malignant neoplasm of bone: Secondary | ICD-10-CM | POA: Diagnosis not present

## 2014-02-09 DIAGNOSIS — Z853 Personal history of malignant neoplasm of breast: Secondary | ICD-10-CM

## 2014-02-09 DIAGNOSIS — C50919 Malignant neoplasm of unspecified site of unspecified female breast: Secondary | ICD-10-CM | POA: Diagnosis not present

## 2014-02-09 LAB — COMPREHENSIVE METABOLIC PANEL (CC13)
ALBUMIN: 3.7 g/dL (ref 3.5–5.0)
ALT: 23 U/L (ref 0–55)
AST: 19 U/L (ref 5–34)
Alkaline Phosphatase: 87 U/L (ref 40–150)
Anion Gap: 8 mEq/L (ref 3–11)
BUN: 12.9 mg/dL (ref 7.0–26.0)
CO2: 29 mEq/L (ref 22–29)
Calcium: 9.6 mg/dL (ref 8.4–10.4)
Chloride: 105 mEq/L (ref 98–109)
Creatinine: 0.9 mg/dL (ref 0.6–1.1)
Glucose: 84 mg/dl (ref 70–140)
POTASSIUM: 4 meq/L (ref 3.5–5.1)
SODIUM: 142 meq/L (ref 136–145)
TOTAL PROTEIN: 7.3 g/dL (ref 6.4–8.3)
Total Bilirubin: 0.52 mg/dL (ref 0.20–1.20)

## 2014-02-09 LAB — CBC WITH DIFFERENTIAL/PLATELET
BASO%: 1 % (ref 0.0–2.0)
Basophils Absolute: 0 10*3/uL (ref 0.0–0.1)
EOS ABS: 0.3 10*3/uL (ref 0.0–0.5)
EOS%: 5.2 % (ref 0.0–7.0)
HCT: 43 % (ref 34.8–46.6)
HGB: 14.1 g/dL (ref 11.6–15.9)
LYMPH%: 33.6 % (ref 14.0–49.7)
MCH: 31.9 pg (ref 25.1–34.0)
MCHC: 32.7 g/dL (ref 31.5–36.0)
MCV: 97.5 fL (ref 79.5–101.0)
MONO#: 0.3 10*3/uL (ref 0.1–0.9)
MONO%: 6.6 % (ref 0.0–14.0)
NEUT#: 2.6 10*3/uL (ref 1.5–6.5)
NEUT%: 53.6 % (ref 38.4–76.8)
Platelets: 177 10*3/uL (ref 145–400)
RBC: 4.41 10*6/uL (ref 3.70–5.45)
RDW: 13.3 % (ref 11.2–14.5)
WBC: 4.8 10*3/uL (ref 3.9–10.3)
lymph#: 1.6 10*3/uL (ref 0.9–3.3)

## 2014-02-09 NOTE — Telephone Encounter (Signed)
Pt confirmed labs/ov/inj per 10/13 POF, gave pt AVS.... KJ

## 2014-02-09 NOTE — Progress Notes (Addendum)
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 The patient has a history of right breast carcinoma status post lumpectomy followed by radiotherapy in 2007 diagnosed in [place] and the patient declined any further treatment at that time.   PRIOR THERAPY: None  CURRENT THERAPY: Arimidex 1 mg by mouth daily. Started 01/22/2014  INTERVAL HISTORY: Erica Young 73 y.o. female returns to the clinic today for followup visit accompanied by husband. She was last seen by Dr. Julien Nordmann on 01/22/2014. She was started on her Revlimid X1 milligram by mouth daily. She reports some mild dizziness and blurred vision that occurred the first 2 or 3 days that she took Arimidex. The symptoms completely resolved and have not recurred. She denied any issues with hot flashes. She has had some difficulty sleeping but states that this is a long-standing problem.  She denied having any significant chest pain, shortness breath, cough or hemoptysis. She denied having any significant weight loss or night sweats. She reports that she recently was seen by her dentist, Dr. Shiela Mayer, office number [phone]. We're awaiting dental clearance sufficient may begin Xgeva to address her bone metastasis.  MEDICAL HISTORY: Past Medical History  Diagnosis Date  . HYPERLIPIDEMIA 03/03/2010  . CEREBROVASCULAR DISEASE 03/03/2010  . DIVERTICULOSIS, COLON 03/03/2010  . Low back pain   . Right leg pain   . History of cerebral artery stenosis     right middle  . Mastoiditis     noted on brain MRI  . Foramen ovale     positive bubble study  . Diverticulitis   . Fatty liver 08/02/09    as per U/S done by [facility]  . Internal hemorrhoid   . Anxiety   . Hypertension   .  Ulcer   . Stroke Oct. 2011    TIA  . HYPOTHYROIDISM 03/03/2010    no longer on meds  . Cancer 2007    right breat ca  , lumpectomy and radiation tx (declined chemo and additional prophylactic meds)  . Shortness of breath   . Headache(784.0)     she thinks sinus headaches  . Rib fracture     ALLERGIES:  is allergic to ciprofloxacin.  MEDICATIONS:  Current Outpatient Prescriptions  Medication Sig Dispense Refill  . ALPRAZolam (XANAX) 0.25 MG tablet Take 1 tablet (0.25 mg total) by mouth 3 (three) times daily as needed for anxiety.  90 tablet  0  . amLODipine (NORVASC) 2.5 MG tablet Take 2.5 mg by mouth daily.      Marland Kitchen anastrozole (ARIMIDEX) 1 MG tablet Take 1 tablet (1 mg total) by mouth daily.  30 tablet  6  . aspirin EC 81 MG tablet Take 81 mg by mouth daily.      . Calcium-Vitamin D (CALTRATE 600 PLUS-VIT D PO) Take by mouth.      . cetirizine (ZYRTEC) 10 MG tablet Take 10 mg by mouth daily.      Marland Kitchen dicyclomine (BENTYL) 10 MG capsule Take 10 mg by mouth 3 (three) times daily as needed for spasms.       . Flaxseed, Linseed, (GROUND FLAX SEEDS PO) Take by mouth. Pt states she takes 2 tablespoons a day.      . Multiple Vitamin (MULTIVITAMIN WITH MINERALS) TABS tablet Take 1 tablet  by mouth daily.      Marland Kitchen OVER THE COUNTER MEDICATION Take 1 tablet by mouth 2 (two) times daily. Vitamin C plus D3      . Probiotic Product (PROBIOTIC DAILY PO) Take 1 capsule by mouth once. Takes 1 a day      . vitamin B-12 (CYANOCOBALAMIN) 100 MCG tablet Take 5,000 mcg by mouth daily.        No current facility-administered medications for this visit.    SURGICAL HISTORY:  Past Surgical History  Procedure Laterality Date  . Cholecystectomy    . Breast lumpectomy      right  . Tonsillectomy    . Colonoscopy    . Tubal ligation    . Shoulder surgery      right shoulder (dislocation)  . Video bronchoscopy with endobronchial ultrasound N/A 01/07/2014    Procedure: VIDEO BRONCHOSCOPY WITH ENDOBRONCHIAL  ULTRASOUND;  Surgeon: Ivin Poot, MD;  Location: [facility] OR;  Service: Thoracic;  Laterality: N/A;    REVIEW OF SYSTEMS:  Constitutional: negative Eyes: negative Ears, nose, mouth, throat, and face: negative Respiratory: negative Cardiovascular: negative Gastrointestinal: negative Genitourinary:negative Integument/breast: negative Hematologic/lymphatic: negative Musculoskeletal:negative Neurological: negative Behavioral/Psych: negative Endocrine: negative Allergic/Immunologic: negative   PHYSICAL EXAMINATION: General appearance: alert, cooperative and no distress Head: Normocephalic, without obvious abnormality, atraumatic Neck: no adenopathy, no JVD, supple, symmetrical, trachea midline and thyroid not enlarged, symmetric, no tenderness/mass/nodules Lymph nodes: Cervical, supraclavicular, and axillary nodes normal. Resp: clear to auscultation bilaterally Back: symmetric, no curvature. ROM normal. No CVA tenderness. Cardio: regular rate and rhythm, S1, S2 normal, no murmur, click, rub or gallop GI: soft, non-tender; bowel sounds normal; no masses,  no organomegaly Extremities: extremities normal, atraumatic, no cyanosis or edema Neurologic: Alert and oriented X 3, normal strength and tone. Normal symmetric reflexes. Normal coordination and gait  ECOG PERFORMANCE STATUS: 1 - Symptomatic but completely ambulatory  Blood pressure 135/75, pulse 77, temperature 98 F (36.7 C), resp. rate 20, height 5\' 2"  (1.575 m), weight 128 lb 11.2 oz (58.378 kg), SpO2 97.00%.  LABORATORY DATA: Lab Results  Component Value Date   WBC 4.8 02/09/2014   HGB 14.1 02/09/2014   HCT 43.0 02/09/2014   MCV 97.5 02/09/2014   PLT 177 02/09/2014      Chemistry      Component Value Date/Time   NA 142 02/09/2014 1024   NA 138 01/06/2014 0910   K 4.0 02/09/2014 1024   K 4.3 01/06/2014 0910   CL 104 01/06/2014 0910   CO2 29 02/09/2014 1024   CO2 22 01/06/2014 0910   BUN 12.9 02/09/2014 1024   BUN 9  01/06/2014 0910   CREATININE 0.9 02/09/2014 1024   CREATININE 0.56 01/06/2014 0910      Component Value Date/Time   CALCIUM 9.6 02/09/2014 1024   CALCIUM 9.0 01/06/2014 0910   ALKPHOS 87 02/09/2014 1024   ALKPHOS 87 01/06/2014 0910   AST 19 02/09/2014 1024   AST 17 01/06/2014 0910   ALT 23 02/09/2014 1024   ALT 16 01/06/2014 0910   BILITOT 0.52 02/09/2014 1024   BILITOT 0.3 01/06/2014 0910       RADIOGRAPHIC STUDIES: Dg Chest 2 View Within Previous 72 Hours.  Films Obtained On Friday Are Acceptable For Monday And Tuesday Cases  01/06/2014   CLINICAL DATA:  Lymphadenopathy and patient with history of breast cancer. Patient for VATS.  EXAM: CHEST  2 VIEW  COMPARISON:  PET CT scan 12/28/2013. CT chest 0 1/ 09/2013. PA and  lateral chest 06/19/2013.  FINDINGS: Mild linear atelectasis is seen in the right lung base. The lungs are otherwise clear. Scar at the left costophrenic angle is unchanged. Mediastinal and hilar adenopathy is better demonstrated on cross-sectional imaging. Heart size is normal. Surgical clips right axilla are noted.  IMPRESSION: No acute disease.   Electronically Signed   By: Inge Rise M.D.   On: 01/06/2014 09:52   Nm Pet Image Restag (ps) Skull Base To Thigh  12/28/2013   CLINICAL DATA:  Subsequent treatment strategy for restaging of hilar adenopathy. History of breast cancer.  EXAM: NUCLEAR MEDICINE PET SKULL BASE TO THIGH  TECHNIQUE: 6.3 MCi F-18 FDG was injected intravenously. Full-ring PET imaging was performed from the skull base to thigh after the radiotracer. CT data was obtained and used for attenuation correction and anatomic localization.  FASTING BLOOD GLUCOSE:  Value: 118 mg/dl  COMPARISON:  05/21/2013 PET. Interval abdominal pelvic CT of 11/18/2013.  FINDINGS: NECK  Low right cervical hypermetabolism, likely corresponding to the hypermetabolic thyroid nodule biopsied on 06/02/2012. This measures a S.U.V. max of 8.8. On the prior exam this measured a S.U.V. max of 9.4.   CHEST  Hypermetabolic thoracic adenopathy. A right internal mammary node measures 7 mm and a S.U.V. max of 3.8 on image 62. This is new.  Precarinal adenopathy measures 2.0 cm short axis and a S.U.V. max of 5.8 on image 65. On the prior exam, nodes in this area measured similar in size and a S.U.V. max of 8.2.  Subcarinal adenopathy measures 2.0 cm and a S.U.V. max of 5.5 on image 73. 2.1 cm and a S.U.V. max of 11.1 on the prior. Mild hypermetabolism corresponding to left-sided pleural thickening. This measures a S.U.V. max of 2.1 on image 76.  ABDOMEN/PELVIS  No areas of abnormal hypermetabolism.  SKELETON  Development of extensive osseous metastasis. Index lesion within the T7 vertebral body measures a S.U.V. max of 10.0. There are also lesions within L1 and L5. Hypermetabolism corresponding to a posterior left fifth rib fracture, likely pathologic. New.  Right proximal femoral lesion measures a S.U.V. max of 12.0 and corresponds to cortical thinning laterally on image 208.  CT IMAGES PERFORMED FOR ATTENUATION CORRECTION  Mild cardiomegaly with coronary artery atherosclerosis. Trace pericardial fluid is unchanged. Right base dependent atelectasis. Subpleural lingular opacity is also favored to represent atelectasis. Cholecystectomy. Pelvic floor laxity.  IMPRESSION: 1. Mixed response to therapy within the chest. Some nodes are decreased in hypermetabolism. However, there is new right internal mammary nodal metastasis. 2. Development of extensive osseous metastasis with presumed pathologic fracture involving the posterior left fifth rib. 3. Similar hypermetabolism involving the low right neck, likely due to a hypermetabolic thyroid nodule.   Electronically Signed   By: Abigail Miyamoto M.D.   On: 12/28/2013 15:08   Chest Port 1 View  01/07/2014   CLINICAL DATA:  Postoperative videoscopic thoracic evaluation; history of breast carcinoma  EXAM: PORTABLE CHEST - 1 VIEW  COMPARISON:  January 06, 2014  FINDINGS: There  is no appreciable pneumothorax. There is persistent elevation the right hemidiaphragm with right base atelectasis. There is mild scarring in the left base. Elsewhere lungs are clear. Heart size and pulmonary vascularity are normal. There is no appreciable adenopathy. There is postoperative change on the right with surgical clips the right axillary region.  IMPRESSION: Atelectasis right base. Scarring left base. Stable elevation right hemidiaphragm. No frank edema or consolidation. No pneumothorax. No change in cardiac silhouette.   Electronically Signed  By: Lowella Grip M.D.   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 Her disease is mainly in the bone as well as the mediastinum. The patient is currently asymptomatic and has no visceral crisis. She will continue on hormonal therapy with Arimidex 1 mg by mouth daily. Patient was discussed with and also seen by Dr. Julien Nordmann. He again reviewed her prognosis she has some concerns of this area. Other treatment options that could be considered for this patient include treatment with letrozole and Ibrance (Palbociclib). Dr. Julien Nordmann we'll consider this treatment if she has intolerance to Arimidex.  2) Metastatic bone disease: I. personally spoke with Dr. Jetty Duhamel, the patient's dentist regarding the initiation of Xgeva. Dr. Jetty Duhamel stated the patient had a loose left front tooth that would need extraction prior to initiating Xgeva therapy. Dr. Jetty Duhamel will contact the patient regarding the need to  extract the loose tooth and heal completely prior to starting [facility] therapy. She will continue daily calcium and vitamin D supplements.  3) followup visit: She'll followup in 3 weeks for another symptom management visit.  She was advised to call immediately if she has any concerning symptoms in the interval.  The patient  voices understanding of current disease status and treatment options and is in agreement with the current care plan.  All questions were answered. The patient knows to call the clinic with any problems, questions or concerns. We can certainly see the patient much sooner if necessary.  Carlton Adam PA-C  ADDENDUM: Hematology/Oncology Attending: I had a face to face encounter with the patient. I recommended her care plan. This is a very pleasant 73 years old white female recently diagnosed with metastatic breast adenocarcinoma with multiple bone metastases. The patient was started on treatment with Arimidex 1 mg by mouth daily 2 weeks ago and tolerating her treatment fairly well with no significant adverse effects. We will get clearance from her dentist before starting treatment with Xgeva for the bone metastasis. We will continue to monitor the patient closely and should have a followup visit in 3 weeks for evaluation and management any adverse effect of her treatment. She was advised to call immediately if she has any concerning symptoms in the interval.  Disclaimer: This note was dictated with voice recognition software. Similar sounding words can inadvertently be transcribed and may not be corrected upon review. Eilleen Kempf., MD 02/11/2014

## 2014-02-10 LAB — CANCER ANTIGEN 27.29: CA 27.29: 127 U/mL — ABNORMAL HIGH (ref 0–39)

## 2014-02-11 NOTE — Patient Instructions (Signed)
Continue Arimidex 1 mg by mouth daily Followup with your dentist regarding the loose tooth that needs it distracting. This will need to be performed and well-healed prior to starting your Xgeva therapy Followup in 3 weeks

## 2014-02-12 ENCOUNTER — Ambulatory Visit (INDEPENDENT_AMBULATORY_CARE_PROVIDER_SITE_OTHER): Payer: Medicare Other | Admitting: Internal Medicine

## 2014-02-12 ENCOUNTER — Encounter: Payer: Self-pay | Admitting: Internal Medicine

## 2014-02-12 VITALS — BP 140/80 | Temp 97.4°F | Wt 129.0 lb

## 2014-02-12 DIAGNOSIS — J309 Allergic rhinitis, unspecified: Secondary | ICD-10-CM | POA: Diagnosis not present

## 2014-02-12 DIAGNOSIS — H6121 Impacted cerumen, right ear: Secondary | ICD-10-CM | POA: Diagnosis not present

## 2014-02-12 DIAGNOSIS — H93291 Other abnormal auditory perceptions, right ear: Secondary | ICD-10-CM

## 2014-02-12 DIAGNOSIS — C50911 Malignant neoplasm of unspecified site of right female breast: Secondary | ICD-10-CM

## 2014-02-12 NOTE — Progress Notes (Signed)
Pre visit review using our clinic review tool, if applicable. No additional management support is needed unless otherwise documented below in the visit note.  Chief Complaint  Patient presents with  . Cerumen Impaction    HPI: Patient Erica Young  comes in today for SDA for  new problem evaluation. PCP NA Seen 10 5 and exam reported as normal. 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 Does have allergies  But no fever  Face pain some pressure at times   Used q tip in ear also. To have tooth implnat before  cheo for metas breast bone cancer .  Feels like water hearing echoing right ear .    ROS: See pertinent positives and negatives per HPI. No change in balance no fever vision change  Past Medical History  Diagnosis Date  . HYPERLIPIDEMIA 03/03/2010  . CEREBROVASCULAR DISEASE 03/03/2010  . DIVERTICULOSIS, COLON 03/03/2010  . Low back pain   . Right leg pain   . History of cerebral artery stenosis     right middle  . Mastoiditis     noted on brain MRI  . Foramen ovale     positive bubble study  . Diverticulitis   . Fatty liver 08/02/09    as per U/S done by [facility]  . Internal hemorrhoid   . Anxiety   . Hypertension   . Ulcer   . Stroke Oct. 2011    TIA  . HYPOTHYROIDISM 03/03/2010    no longer on meds  . Cancer 2007    right breat ca  , lumpectomy and radiation tx (declined chemo and additional prophylactic meds)  . Shortness of breath   . Headache(784.0)     she thinks sinus headaches  . Rib fracture     Family History  Problem Relation Age of Onset  . Heart disease Sister     cerebral vascular disease also  . Heart disease Brother     cerebral vascular disease also  . Heart disease Brother     cerebral vascular disease  . Heart disease Sister     cebreal vascular disease also  . Heart disease Sister     cebreal vascular diease also  . Heart disease Sister    cerebral vascular diease also  . Colon cancer Neg Hx   . Esophageal cancer Neg Hx   . Rectal cancer Neg Hx   . Stomach cancer Father     History   Social History  . Marital Status: Married    Spouse Name: N/A    Number of Children: 2  . Years of Education: N/A   Occupational History  . Social Worker--Retired    Social History Main Topics  . Smoking status: Former Smoker -- 0.50 packs/day for 20 years    Types: Cigarettes    Quit date: 05/01/1991  . Smokeless tobacco: Never Used  . Alcohol Use: Yes     Comment: socially  . Drug Use: No  . Sexual Activity: Yes   Other Topics Concern  . None   Social History Narrative   Daily caffeine     Outpatient Encounter Prescriptions as of 02/12/2014  Medication Sig  . ALPRAZolam (XANAX) 0.25 MG tablet Take 1 tablet (0.25 mg total) by mouth 3 (three) times daily as needed for anxiety.  Marland Kitchen amLODipine (NORVASC) 2.5 MG tablet Take 2.5 mg by mouth daily.  Marland Kitchen anastrozole (ARIMIDEX) 1 MG  tablet Take 1 tablet (1 mg total) by mouth daily.  Marland Kitchen aspirin EC 81 MG tablet Take 81 mg by mouth daily.  . Calcium-Vitamin D (CALTRATE 600 PLUS-VIT D PO) Take by mouth.  . cetirizine (ZYRTEC) 10 MG tablet Take 10 mg by mouth daily.  Marland Kitchen dicyclomine (BENTYL) 10 MG capsule Take 10 mg by mouth 3 (three) times daily as needed for spasms.   . Flaxseed, Linseed, (GROUND FLAX SEEDS PO) Take by mouth. Pt states she takes 2 tablespoons a day.  . Multiple Vitamin (MULTIVITAMIN WITH MINERALS) TABS tablet Take 1 tablet by mouth daily.  Marland Kitchen OVER THE COUNTER MEDICATION Take 1 tablet by mouth 2 (two) times daily. Vitamin C plus D3  . Probiotic Product (PROBIOTIC DAILY PO) Take 1 capsule by mouth once. Takes 1 a day  . vitamin B-12 (CYANOCOBALAMIN) 100 MCG tablet Take 5,000 mcg by mouth daily.     EXAM:  BP 140/80  Temp(Src) 97.4 F (36.3 C) (Oral)  Wt 129 lb (58.514 kg)  Body mass index is 23.59 kg/(m^2).  GENERAL: vitals reviewed and listed above, alert,  oriented, appears well hydrated and in no acute distress HEENT: atraumatic, conjunctiva  clear, no obvious abnormalities on inspection of external nose and ears   eac wax r 3+ lefrt 2+   Removed with irrigation  Minimal debvrei remains  tms intact shiny grey ?sligly splayed light reflex   Nares min congestion  Face non tender to tapping OP : no lesion edema or exudate  NECK: no obvious masses on inspection palpation  MS: moves all extremities without noticeable focal  Abnormality Neuro grossly normal  PSYCH: pleasant and cooperative, ASSESSMENT AND PLAN:  Discussed the following assessment and plan:  Distorted hearing of right ear  Excess wax in ear, right  Allergic rhinitis, unspecified allergic rhinitis type  Cancer of right breast, stage 4  -Patient advised to return or notify health care team  if symptoms worsen ,persist or new concerns arise.  Patient Instructions  Your ear drum looks a lot better . After wax removed  Minimal debri left in ear canal.  I dont see infection.  It is possible that allergies also contributing to your symptoms . Add  flonase or nasacort nose spray  2 sprays each nostril every day for at least a week.  Shouldn't interfere with your dental  Procedure but  Tell your dentist about this.    Standley Brooking. Ryen Rhames M.D.

## 2014-02-12 NOTE — Patient Instructions (Signed)
Your ear drum looks a lot better . After wax removed  Minimal debri left in ear canal.  I dont see infection.  It is possible that allergies also contributing to your symptoms . Add  flonase or nasacort nose spray  2 sprays each nostril every day for at least a week.  Shouldn't interfere with your dental  Procedure but  Tell your dentist about this.

## 2014-02-18 ENCOUNTER — Ambulatory Visit (INDEPENDENT_AMBULATORY_CARE_PROVIDER_SITE_OTHER): Payer: Medicare Other | Admitting: Internal Medicine

## 2014-02-18 ENCOUNTER — Encounter: Payer: Self-pay | Admitting: Internal Medicine

## 2014-02-18 VITALS — BP 110/70 | HR 84 | Temp 97.9°F | Resp 18 | Ht 62.0 in | Wt 127.0 lb

## 2014-02-18 DIAGNOSIS — C50911 Malignant neoplasm of unspecified site of right female breast: Secondary | ICD-10-CM

## 2014-02-18 DIAGNOSIS — H8110 Benign paroxysmal vertigo, unspecified ear: Secondary | ICD-10-CM

## 2014-02-18 DIAGNOSIS — I10 Essential (primary) hypertension: Secondary | ICD-10-CM

## 2014-02-18 MED ORDER — ALPRAZOLAM 0.25 MG PO TABS: 0.2500 mg | ORAL_TABLET | Freq: Three times a day (TID) | ORAL | Status: DC | PRN

## 2014-02-18 MED ORDER — MECLIZINE HCL 25 MG PO TABS: 25.0000 mg | ORAL_TABLET | Freq: Three times a day (TID) | ORAL | Status: DC | PRN

## 2014-02-18 NOTE — Progress Notes (Signed)
Pre visit review using our clinic review tool, if applicable. No additional management support is needed unless otherwise documented below in the visit note. 

## 2014-02-18 NOTE — Patient Instructions (Signed)
Call or return to clinic prn if these symptoms worsen or fail to improve as anticipated.  Benign Positional Vertigo Vertigo means you feel like you or your surroundings are moving when they are not. Benign positional vertigo is the most common form of vertigo. Benign means that the cause of your condition is not serious. Benign positional vertigo is more common in older adults. CAUSES  Benign positional vertigo is the result of an upset in the labyrinth system. This is an area in the middle ear that helps control your balance. This may be caused by a viral infection, head injury, or repetitive motion. However, often no specific cause is found. SYMPTOMS  Symptoms of benign positional vertigo occur when you move your head or eyes in different directions. Some of the symptoms may include:  Loss of balance and falls.  Vomiting.  Blurred vision.  Dizziness.  Nausea.  Involuntary eye movements (nystagmus). DIAGNOSIS  Benign positional vertigo is usually diagnosed by physical exam. If the specific cause of your benign positional vertigo is unknown, your caregiver may perform imaging tests, such as magnetic resonance imaging (MRI) or computed tomography (CT). TREATMENT  Your caregiver may recommend movements or procedures to correct the benign positional vertigo. Medicines such as meclizine, benzodiazepines, and medicines for nausea may be used to treat your symptoms. In rare cases, if your symptoms are caused by certain conditions that affect the inner ear, you may need surgery. HOME CARE INSTRUCTIONS   Follow your caregiver's instructions.  Move slowly. Do not make sudden body or head movements.  Avoid driving.  Avoid operating heavy machinery.  Avoid performing any tasks that would be dangerous to you or others during a vertigo episode.  Drink enough fluids to keep your urine clear or pale yellow. SEEK IMMEDIATE MEDICAL CARE IF:   You develop problems with walking, weakness,  numbness, or using your arms, hands, or legs.  You have difficulty speaking.  You develop severe headaches.  Your nausea or vomiting continues or gets worse.  You develop visual changes.  Your family or friends notice any behavioral changes.  Your condition gets worse.  You have a fever.  You develop a stiff neck or sensitivity to light. MAKE SURE YOU:   Understand these instructions.  Will watch your condition.  Will get help right away if you are not doing well or get worse. Document Released: 01/22/2006 Document Revised: 07/09/2011 Document Reviewed: 01/04/2011 [facility] Patient Information 2015 [place], [place]. This information is not intended to replace advice given to you by your health care provider. Make sure you discuss any questions you have with your health care provider.

## 2014-02-19 ENCOUNTER — Encounter: Payer: Self-pay | Admitting: Internal Medicine

## 2014-02-19 ENCOUNTER — Ambulatory Visit: Payer: Medicare Other | Admitting: *Deleted

## 2014-02-19 LAB — AFB CULTURE WITH SMEAR (NOT AT ARMC): Acid Fast Smear: NONE SEEN

## 2014-02-19 NOTE — Progress Notes (Signed)
Subjective:    Patient ID: Erica Young, female    DOB: [birth year], 73 y.o.   MRN: [id number]  HPI  73 year old patient who has hypertension and metastatic breast cancer.  She was seen last week for ear irrigation.  Due to cerumen impactions.  Since that time.  She has had 2 episodes of significant vertigo associated with nausea and vomiting.  Presently, she feels well.  Her last episode was last night.  Her hearing has improved.  Past Medical History  Diagnosis Date  . HYPERLIPIDEMIA 03/03/2010  . CEREBROVASCULAR DISEASE 03/03/2010  . DIVERTICULOSIS, COLON 03/03/2010  . Low back pain   . Right leg pain   . History of cerebral artery stenosis     right middle  . Mastoiditis     noted on brain MRI  . Foramen ovale     positive bubble study  . Diverticulitis   . Fatty liver 08/02/09    as per U/S done by [facility]  . Internal hemorrhoid   . Anxiety   . Hypertension   . Ulcer   . Stroke Oct. 2011    TIA  . HYPOTHYROIDISM 03/03/2010    no longer on meds  . Cancer 2007    right breat ca  , lumpectomy and radiation tx (declined chemo and additional prophylactic meds)  . Shortness of breath   . Headache(784.0)     she thinks sinus headaches  . Rib fracture     History   Social History  . Marital Status: Married    Spouse Name: N/A    Number of Children: 2  . Years of Education: N/A   Occupational History  . Social Worker--Retired    Social History Main Topics  . Smoking status: Former Smoker -- 0.50 packs/day for 20 years    Types: Cigarettes    Quit date: 05/01/1991  . Smokeless tobacco: Never Used  . Alcohol Use: Yes     Comment: socially  . Drug Use: No  . Sexual Activity: Yes   Other Topics Concern  . Not on file   Social History Narrative   Daily caffeine     Past Surgical History  Procedure Laterality Date  . Cholecystectomy    . Breast lumpectomy      right  . Tonsillectomy    . Colonoscopy    . Tubal ligation    . Shoulder surgery       right shoulder (dislocation)  . Video bronchoscopy with endobronchial ultrasound N/A 01/07/2014    Procedure: VIDEO BRONCHOSCOPY WITH ENDOBRONCHIAL ULTRASOUND;  Surgeon: Ivin Poot, MD;  Location: [facility];  Service: Thoracic;  Laterality: N/A;    Family History  Problem Relation Age of Onset  . Heart disease Sister     cerebral vascular disease also  . Heart disease Brother     cerebral vascular disease also  . Heart disease Brother     cerebral vascular disease  . Heart disease Sister     cebreal vascular disease also  . Heart disease Sister     cebreal vascular diease also  . Heart disease Sister     cerebral vascular diease also  . Colon cancer Neg Hx   . Esophageal cancer Neg Hx   . Rectal cancer Neg Hx   . Stomach cancer Father     Allergies  Allergen Reactions  . Ciprofloxacin Anaphylaxis    Current Outpatient Prescriptions on File Prior to Visit  Medication Sig Dispense Refill  .  amLODipine (NORVASC) 2.5 MG tablet Take 2.5 mg by mouth daily.      Marland Kitchen anastrozole (ARIMIDEX) 1 MG tablet Take 1 tablet (1 mg total) by mouth daily.  30 tablet  6  . aspirin EC 81 MG tablet Take 81 mg by mouth daily.      . Calcium-Vitamin D (CALTRATE 600 PLUS-VIT D PO) Take by mouth.      . cetirizine (ZYRTEC) 10 MG tablet Take 10 mg by mouth daily.      Marland Kitchen dicyclomine (BENTYL) 10 MG capsule Take 10 mg by mouth 3 (three) times daily as needed for spasms.       . Flaxseed, Linseed, (GROUND FLAX SEEDS PO) Take by mouth. Pt states she takes 2 tablespoons a day.      . Multiple Vitamin (MULTIVITAMIN WITH MINERALS) TABS tablet Take 1 tablet by mouth daily.      Marland Kitchen OVER THE COUNTER MEDICATION Take 1 tablet by mouth 2 (two) times daily. Vitamin C plus D3      . Probiotic Product (PROBIOTIC DAILY PO) Take 1 capsule by mouth once. Takes 1 a day      . vitamin B-12 (CYANOCOBALAMIN) 100 MCG tablet Take 5,000 mcg by mouth daily.        No current facility-administered medications on file prior to  visit.    BP 110/70  Pulse 84  Temp(Src) 97.9 F (36.6 C) (Oral)  Resp 18  Ht 5\' 2"  (1.575 m)  Wt 127 lb (57.607 kg)  BMI 23.22 kg/m2  SpO2 97%     Review of Systems  Constitutional: Negative.   HENT: Negative for congestion, dental problem, hearing loss, rhinorrhea, sinus pressure, sore throat and tinnitus.   Eyes: Negative for pain, discharge and visual disturbance.  Respiratory: Negative for cough and shortness of breath.   Cardiovascular: Negative for chest pain, palpitations and leg swelling.  Gastrointestinal: Positive for nausea and vomiting. Negative for abdominal pain, diarrhea, constipation, blood in stool and abdominal distention.  Genitourinary: Negative for dysuria, urgency, frequency, hematuria, flank pain, vaginal bleeding, vaginal discharge, difficulty urinating, vaginal pain and pelvic pain.  Musculoskeletal: Negative for arthralgias, gait problem and joint swelling.  Skin: Negative for rash.  Neurological: Positive for dizziness. Negative for syncope, speech difficulty, weakness, numbness and headaches.  Hematological: Negative for adenopathy.  Psychiatric/Behavioral: Negative for behavioral problems, dysphoric mood and agitation. The patient is not nervous/anxious.        Objective:   Physical Exam  Constitutional: She is oriented to person, place, and time. She appears well-developed and well-nourished.  HENT:  Head: Normocephalic.  Right Ear: External ear normal.  Left Ear: External ear normal.  Mouth/Throat: Oropharynx is clear and moist.  Minimal cerumen left canal Right canal is clear Tympanic membrane appears normal  Eyes: Conjunctivae and EOM are normal. Pupils are equal, round, and reactive to light.  Neck: Normal range of motion. Neck supple. No thyromegaly present.  Cardiovascular: Normal rate, regular rhythm, normal heart sounds and intact distal pulses.   Pulmonary/Chest: Effort normal and breath sounds normal.  Abdominal: Soft. Bowel  sounds are normal. She exhibits no mass. There is no tenderness.  Musculoskeletal: Normal range of motion.  Lymphadenopathy:    She has no cervical adenopathy.  Neurological: She is alert and oriented to person, place, and time.  Skin: Skin is warm and dry. No rash noted.  Psychiatric: She has a normal mood and affect. Her behavior is normal.          Assessment &  Plan:   Benign positional vertigo.  We'll treat symptomatically and observe.  Hopefully, will be self-limiting.  We'll need to rule out CNS etiology.  If symptoms persist or worsen Hypertension stable

## 2014-02-24 ENCOUNTER — Telehealth: Payer: Self-pay | Admitting: Medical Oncology

## 2014-02-24 NOTE — Telephone Encounter (Signed)
She had dental implant last week and sees dentist on 11/19 for post op. She sees her general dentist in 3 months for crown. Do you want her to keep appt next week with you and xgeva  or r/s after perio dontist/general dentist  clears her.Note to [place].

## 2014-02-25 ENCOUNTER — Telehealth: Payer: Self-pay | Admitting: Medical Oncology

## 2014-02-25 ENCOUNTER — Telehealth: Payer: Self-pay | Admitting: Internal Medicine

## 2014-02-25 NOTE — Telephone Encounter (Signed)
Returned pt call and she wants to have thyroid test done . Her general dentist  is Aleda Grana ( [phone])NHR Dr Royston Cowper [phone] ( periodontist). I called Dr Jetty Duhamel and she said dental clearance for xjeva needs to come for Dr Satira Sark.

## 2014-02-25 NOTE — Telephone Encounter (Signed)
LM to confirm d/t for new 03/19/14. Also mailed cal.

## 2014-02-26 ENCOUNTER — Telehealth: Payer: Self-pay | Admitting: Internal Medicine

## 2014-02-26 DIAGNOSIS — E039 Hypothyroidism, unspecified: Secondary | ICD-10-CM

## 2014-02-26 NOTE — Telephone Encounter (Signed)
Pt states her oncologist's nurse, Izora Gala, @  [phone]  told her to call pcp to have a thyroid lab done to see if pt's thyroid is active.  The oncologist is wanting to give pt an injection in November for her treatment . pls advise

## 2014-02-26 NOTE — Telephone Encounter (Signed)
Spoke to pt, told her she can come in for TSH at her convience just need to make an appt for the lab. Pt verbalized understanding. TSH order put in EPIC.

## 2014-02-26 NOTE — Telephone Encounter (Signed)
Okay to drop by the office lab for TSH at patient's convenience

## 2014-02-26 NOTE — Telephone Encounter (Signed)
Please see message and advise 

## 2014-03-01 ENCOUNTER — Other Ambulatory Visit (INDEPENDENT_AMBULATORY_CARE_PROVIDER_SITE_OTHER): Payer: Medicare Other

## 2014-03-01 ENCOUNTER — Encounter: Payer: Self-pay | Admitting: Internal Medicine

## 2014-03-01 DIAGNOSIS — E039 Hypothyroidism, unspecified: Secondary | ICD-10-CM

## 2014-03-01 LAB — TSH: TSH: 2.88 u[IU]/mL (ref 0.35–4.50)

## 2014-03-04 ENCOUNTER — Ambulatory Visit: Payer: Medicare Other

## 2014-03-04 ENCOUNTER — Ambulatory Visit: Payer: Medicare Other | Admitting: Internal Medicine

## 2014-03-04 ENCOUNTER — Other Ambulatory Visit: Payer: Medicare Other

## 2014-03-18 ENCOUNTER — Telehealth: Payer: Self-pay | Admitting: Medical Oncology

## 2014-03-18 NOTE — Telephone Encounter (Signed)
I spoke to Dr. Satira Sark assistant and was told that he does not want pt to have any bisphosphanate until he checks her in January to see if bone is remodeling after dental implant. I called pt and told her this and will cancel her appt tomorrow.

## 2014-03-19 ENCOUNTER — Ambulatory Visit: Payer: Medicare Other

## 2014-03-19 ENCOUNTER — Other Ambulatory Visit: Payer: Medicare Other

## 2014-03-19 ENCOUNTER — Ambulatory Visit: Payer: Medicare Other | Admitting: Physician Assistant

## 2014-04-02 ENCOUNTER — Other Ambulatory Visit: Payer: Self-pay | Admitting: Medical Oncology

## 2014-04-06 ENCOUNTER — Other Ambulatory Visit: Payer: Self-pay | Admitting: Internal Medicine

## 2014-04-07 ENCOUNTER — Telehealth: Payer: Self-pay | Admitting: *Deleted

## 2014-04-07 NOTE — Telephone Encounter (Signed)
Received call from [facility] with Dr. Mellissa Kohut office to give an update on her dental treatment. She stated that patient should probably finish treatment at the end of Feb to March and then be able to get her treatment here. Jinny Blossom stated that they would call us when she is done.

## 2014-04-20 ENCOUNTER — Telehealth: Payer: Self-pay | Admitting: *Deleted

## 2014-04-20 NOTE — Telephone Encounter (Signed)
Pt called stating that she has several questions she wants to speak with Dr Vista Mink about.  She wants to know if he can call her.  Appt made 04/29/14 for patient to discuss questions and concerns at that time.  Pt is aware of appt.

## 2014-04-29 ENCOUNTER — Ambulatory Visit (HOSPITAL_BASED_OUTPATIENT_CLINIC_OR_DEPARTMENT_OTHER): Payer: Medicare Other

## 2014-04-29 ENCOUNTER — Telehealth: Payer: Self-pay | Admitting: Internal Medicine

## 2014-04-29 ENCOUNTER — Ambulatory Visit (HOSPITAL_BASED_OUTPATIENT_CLINIC_OR_DEPARTMENT_OTHER): Payer: Medicare Other | Admitting: Internal Medicine

## 2014-04-29 VITALS — BP 135/65 | HR 81 | Temp 97.7°F | Resp 20 | Ht 62.0 in | Wt 130.2 lb

## 2014-04-29 DIAGNOSIS — C50911 Malignant neoplasm of unspecified site of right female breast: Secondary | ICD-10-CM

## 2014-04-29 DIAGNOSIS — C781 Secondary malignant neoplasm of mediastinum: Secondary | ICD-10-CM

## 2014-04-29 DIAGNOSIS — C7951 Secondary malignant neoplasm of bone: Secondary | ICD-10-CM

## 2014-04-29 DIAGNOSIS — C50919 Malignant neoplasm of unspecified site of unspecified female breast: Secondary | ICD-10-CM | POA: Diagnosis not present

## 2014-04-29 LAB — CBC WITH DIFFERENTIAL/PLATELET
BASO%: 1.3 % (ref 0.0–2.0)
Basophils Absolute: 0.1 10*3/uL (ref 0.0–0.1)
EOS%: 7 % (ref 0.0–7.0)
Eosinophils Absolute: 0.3 10*3/uL (ref 0.0–0.5)
HCT: 43.6 % (ref 34.8–46.6)
HGB: 14.3 g/dL (ref 11.6–15.9)
LYMPH#: 1.7 10*3/uL (ref 0.9–3.3)
LYMPH%: 35.4 % (ref 14.0–49.7)
MCH: 32 pg (ref 25.1–34.0)
MCHC: 32.8 g/dL (ref 31.5–36.0)
MCV: 97.8 fL (ref 79.5–101.0)
MONO#: 0.3 10*3/uL (ref 0.1–0.9)
MONO%: 6.9 % (ref 0.0–14.0)
NEUT#: 2.3 10*3/uL (ref 1.5–6.5)
NEUT%: 49.4 % (ref 38.4–76.8)
Platelets: 172 10*3/uL (ref 145–400)
RBC: 4.46 10*6/uL (ref 3.70–5.45)
RDW: 13.3 % (ref 11.2–14.5)
WBC: 4.7 10*3/uL (ref 3.9–10.3)

## 2014-04-29 LAB — COMPREHENSIVE METABOLIC PANEL (CC13)
ALT: 19 U/L (ref 0–55)
AST: 18 U/L (ref 5–34)
Albumin: 3.6 g/dL (ref 3.5–5.0)
Alkaline Phosphatase: 79 U/L (ref 40–150)
Anion Gap: 9 mEq/L (ref 3–11)
BILIRUBIN TOTAL: 0.54 mg/dL (ref 0.20–1.20)
BUN: 15.2 mg/dL (ref 7.0–26.0)
CO2: 28 mEq/L (ref 22–29)
CREATININE: 0.8 mg/dL (ref 0.6–1.1)
Calcium: 9.4 mg/dL (ref 8.4–10.4)
Chloride: 105 mEq/L (ref 98–109)
EGFR: 74 mL/min/{1.73_m2} — AB (ref >90)
GLUCOSE: 98 mg/dL (ref 70–140)
Potassium: 3.9 mEq/L (ref 3.5–5.1)
Sodium: 141 mEq/L (ref 136–145)
Total Protein: 6.9 g/dL (ref 6.4–8.3)

## 2014-04-29 LAB — CANCER ANTIGEN 27.29: CA 27.29: 67 U/mL — ABNORMAL HIGH (ref 0–39)

## 2014-04-29 NOTE — Telephone Encounter (Signed)
gv and printed appt sched and avs for pt for Feb and march 2016...gv pt barium

## 2014-04-29 NOTE — Progress Notes (Signed)
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 The patient has a history of right breast carcinoma status post lumpectomy followed by radiotherapy in 2007 diagnosed in [place] and the patient declined any further treatment at that time.   PRIOR THERAPY: None  CURRENT THERAPY: Arimidex 1 mg by mouth daily. Started 01/22/2014  INTERVAL HISTORY: Erica Young 73 y.o. female returns to the clinic today for followup visit. The patient is feeling much better today and has significant improvement in her condition since starting treatment with Arimidex. She denied having any significant weight loss or night sweats. She has no nausea or vomiting. The patient denied having any significant fever or chills. She denied having any significant adverse effects from the treatment with Arimidex. She has a lot of questions and concerns about treatment with [facility] See especially with the extensive dental work that she needs. She is here today for evaluation and to answer some of her questions and concerns.  MEDICAL HISTORY: Past Medical History  Diagnosis Date  . HYPERLIPIDEMIA 03/03/2010  . CEREBROVASCULAR DISEASE 03/03/2010  . DIVERTICULOSIS, COLON 03/03/2010  . Low back pain   . Right leg pain   . History of cerebral artery stenosis     right middle  . Mastoiditis     noted on brain MRI  . Foramen ovale     positive bubble study  . Diverticulitis   . Fatty liver 08/02/09    as per U/S done by [facility]  . Internal hemorrhoid   . Anxiety   . Hypertension   . Ulcer   . Stroke Oct. 2011    TIA  . HYPOTHYROIDISM 03/03/2010    no longer on meds  . Cancer 2007    right breat ca  , lumpectomy and radiation tx (declined chemo and additional prophylactic meds)    . Shortness of breath   . Headache(784.0)     she thinks sinus headaches  . Rib fracture     ALLERGIES:  is allergic to ciprofloxacin.  MEDICATIONS:  Current Outpatient Prescriptions  Medication Sig Dispense Refill  . ALPRAZolam (XANAX) 0.25 MG tablet TAKE 1 TABLET BY MOUTH THREE TIMES DAILY AS NEEDED FOR ANXIETY 90 tablet 2  . amLODipine (NORVASC) 2.5 MG tablet Take 2.5 mg by mouth daily.    Marland Kitchen anastrozole (ARIMIDEX) 1 MG tablet Take 1 tablet (1 mg total) by mouth daily. 30 tablet 6  . aspirin EC 81 MG tablet Take 81 mg by mouth daily.    . Calcium-Vitamin D (CALTRATE 600 PLUS-VIT D PO) Take by mouth.    . cetirizine (ZYRTEC) 10 MG tablet Take 10 mg by mouth every other day.     . Multiple Vitamin (MULTIVITAMIN WITH MINERALS) TABS tablet Take 1 tablet by mouth daily.    . Probiotic Product (PROBIOTIC DAILY PO) Take 1 capsule by mouth once. Takes 1 a day    . vitamin B-12 (CYANOCOBALAMIN) 100 MCG tablet Take 1,000 mcg by mouth daily.      No current facility-administered medications for this visit.    SURGICAL HISTORY:  Past Surgical History  Procedure Laterality Date  . Cholecystectomy    . Breast lumpectomy      right  . Tonsillectomy    . Colonoscopy    . Tubal ligation    .  Shoulder surgery      right shoulder (dislocation)  . Video bronchoscopy with endobronchial ultrasound N/A 01/07/2014    Procedure: VIDEO BRONCHOSCOPY WITH ENDOBRONCHIAL ULTRASOUND;  Surgeon: Ivin Poot, MD;  Location: [facility] OR;  Service: Thoracic;  Laterality: N/A;    REVIEW OF SYSTEMS:  Constitutional: negative Eyes: negative Ears, nose, mouth, throat, and face: negative Respiratory: negative Cardiovascular: negative Gastrointestinal: negative Genitourinary:negative Integument/breast: negative Hematologic/lymphatic: negative Musculoskeletal:negative Neurological: negative Behavioral/Psych: negative Endocrine: negative Allergic/Immunologic: negative   PHYSICAL EXAMINATION: General  appearance: alert, cooperative and no distress Head: Normocephalic, without obvious abnormality, atraumatic Neck: no adenopathy, no JVD, supple, symmetrical, trachea midline and thyroid not enlarged, symmetric, no tenderness/mass/nodules Lymph nodes: Cervical, supraclavicular, and axillary nodes normal. Resp: clear to auscultation bilaterally Back: symmetric, no curvature. ROM normal. No CVA tenderness. Cardio: regular rate and rhythm, S1, S2 normal, no murmur, click, rub or gallop GI: soft, non-tender; bowel sounds normal; no masses,  no organomegaly Extremities: extremities normal, atraumatic, no cyanosis or edema Neurologic: Alert and oriented X 3, normal strength and tone. Normal symmetric reflexes. Normal coordination and gait  ECOG PERFORMANCE STATUS: 1 - Symptomatic but completely ambulatory  Blood pressure 135/65, pulse 81, temperature 97.7 F (36.5 C), temperature source Oral, resp. rate 20, height 5\' 2"  (1.575 m), weight 130 lb 3.2 oz (59.058 kg).  LABORATORY DATA: Lab Results  Component Value Date   WBC 4.8 02/09/2014   HGB 14.1 02/09/2014   HCT 43.0 02/09/2014   MCV 97.5 02/09/2014   PLT 177 02/09/2014      Chemistry      Component Value Date/Time   NA 142 02/09/2014 1024   NA 138 01/06/2014 0910   K 4.0 02/09/2014 1024   K 4.3 01/06/2014 0910   CL 104 01/06/2014 0910   CO2 29 02/09/2014 1024   CO2 22 01/06/2014 0910   BUN 12.9 02/09/2014 1024   BUN 9 01/06/2014 0910   CREATININE 0.9 02/09/2014 1024   CREATININE 0.56 01/06/2014 0910      Component Value Date/Time   CALCIUM 9.6 02/09/2014 1024   CALCIUM 9.0 01/06/2014 0910   ALKPHOS 87 02/09/2014 1024   ALKPHOS 87 01/06/2014 0910   AST 19 02/09/2014 1024   AST 17 01/06/2014 0910   ALT 23 02/09/2014 1024   ALT 16 01/06/2014 0910   BILITOT 0.52 02/09/2014 1024   BILITOT 0.3 01/06/2014 0910       RADIOGRAPHIC STUDIES:  ASSESSMENT AND PLAN: This is a very pleasant 73 years old Hispanic female with  r  1) 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 Her disease is mainly in the bone as well as the mediastinum. The patient is currently asymptomatic and has no visceral crisis. She is currently on treatment with Arimidex 1 mg by mouth daily since 01/22/2014 and tolerating her treatment fairly well with no significant adverse effects. The patient noticed significant improvement in her symptoms and general condition after starting the treatment with the hormonal therapy. I recommended for her to continue with the same treatment for now. I will repeat CBC, comprehensive metabolic panel as well as CA 27-29 today to monitor the response to the treatment. We also discussed her prognosis. I explained to the patient that there are several other treatment options if the first line hormonal therapy fails. I will also arrange for the patient to come back for follow-up visit in 2 months for reevaluation after repeating CT scan of the chest, abdomen and pelvis as well as bone scan.  2)  Metastatic bone disease: I had extensive discussion with the patient today about her bone disease and the role of Xgeva and treatment of her metastatic bone disease. I explained to the patient that I will not be able to start treatment with Xgeva until I have dental clearance from her dentist to decrease the chances of osteonecrosis of the jaw. I also advised the patient to take a daily calcium and vitamin D supplements.  3) followup visit: The patient would come back for followup visit in 2 months for evaluation and management any adverse effect of her treatment. I gave the patient and her family the time to ask questions and answers and completed to their satisfaction. She was advised to call immediately if she has any concerning symptoms in the interval.  The patient voices understanding of current disease status and treatment options and is in agreement with the current care plan.  All questions were  answered. The patient knows to call the clinic with any problems, questions or concerns. We can certainly see the patient much sooner if necessary.  I spent 25 minutes counseling the patient face to face. The total time spent in the appointment was 35 minutes.  Disclaimer: This note was dictated with voice recognition software. Similar sounding words can inadvertently be transcribed and may not be corrected upon review.

## 2014-04-30 ENCOUNTER — Encounter: Payer: Self-pay | Admitting: Internal Medicine

## 2014-05-04 ENCOUNTER — Ambulatory Visit (INDEPENDENT_AMBULATORY_CARE_PROVIDER_SITE_OTHER): Payer: Medicare Other | Admitting: Internal Medicine

## 2014-05-04 ENCOUNTER — Encounter: Payer: Self-pay | Admitting: Internal Medicine

## 2014-05-04 VITALS — BP 122/80 | HR 79 | Temp 97.8°F | Resp 18 | Ht 62.0 in | Wt 130.0 lb

## 2014-05-04 DIAGNOSIS — C50911 Malignant neoplasm of unspecified site of right female breast: Secondary | ICD-10-CM

## 2014-05-04 DIAGNOSIS — I10 Essential (primary) hypertension: Secondary | ICD-10-CM

## 2014-05-04 DIAGNOSIS — I679 Cerebrovascular disease, unspecified: Secondary | ICD-10-CM | POA: Diagnosis not present

## 2014-05-04 MED ORDER — SIMVASTATIN 5 MG PO TABS: 5.0000 mg | ORAL_TABLET | Freq: Every day | ORAL | Status: DC

## 2014-05-04 MED ORDER — DILTIAZEM HCL ER COATED BEADS 120 MG PO CP24: 120.0000 mg | ORAL_CAPSULE | Freq: Every day | ORAL | Status: DC

## 2014-05-04 MED ORDER — ALPRAZOLAM 0.25 MG PO TABS: 0.2500 mg | ORAL_TABLET | Freq: Three times a day (TID) | ORAL | Status: DC | PRN

## 2014-05-04 NOTE — Progress Notes (Signed)
Pre visit review using our clinic review tool, if applicable. No additional management support is needed unless otherwise documented below in the visit note. 

## 2014-05-04 NOTE — Patient Instructions (Signed)
Limit your sodium (Salt) intake  Please check your blood pressure on a regular basis.  If it is consistently greater than 150/90, please make an office appointment.  Return in 6 months for follow-up   

## 2014-05-04 NOTE — Progress Notes (Signed)
Subjective:    Patient ID: Erica Young, female    DOB: Feb 20, 1941, 74 y.o.   MRN: [id number]  HPI  74 year old patient who is followed by oncology.  Due to stage IV right breast cancer.  She has a history of hypertension and remote history of cerebrovascular disease.  She has been on statin therapy for secondary prevention.  She has been off statin therapy for a few months but wishes to resume due to her desire to relax dietary restrictions.  In general doing quite well. A number of questions and concerns addressed  Past Medical History  Diagnosis Date  . HYPERLIPIDEMIA 03/03/2010  . CEREBROVASCULAR DISEASE 03/03/2010  . DIVERTICULOSIS, COLON 03/03/2010  . Low back pain   . Right leg pain   . History of cerebral artery stenosis     right middle  . Mastoiditis     noted on brain MRI  . Foramen ovale     positive bubble study  . Diverticulitis   . Fatty liver 08/02/09    as per U/S done by [facility]  . Internal hemorrhoid   . Anxiety   . Hypertension   . Ulcer   . Stroke Oct. 2011    TIA  . HYPOTHYROIDISM 03/03/2010    no longer on meds  . Cancer 2007    right breat ca  , lumpectomy and radiation tx (declined chemo and additional prophylactic meds)  . Shortness of breath   . Headache(784.0)     she thinks sinus headaches  . Rib fracture     History   Social History  . Marital Status: Married    Spouse Name: N/A    Number of Children: 2  . Years of Education: N/A   Occupational History  . Social Worker--Retired    Social History Main Topics  . Smoking status: Former Smoker -- 0.50 packs/day for 20 years    Types: Cigarettes    Quit date: 05/01/1991  . Smokeless tobacco: Never Used  . Alcohol Use: Yes     Comment: socially  . Drug Use: No  . Sexual Activity: Yes   Other Topics Concern  . Not on file   Social History Narrative   Daily caffeine     Past Surgical History  Procedure Laterality Date  . Cholecystectomy    . Breast lumpectomy        right  . Tonsillectomy    . Colonoscopy    . Tubal ligation    . Shoulder surgery      right shoulder (dislocation)  . Video bronchoscopy with endobronchial ultrasound N/A 01/07/2014    Procedure: VIDEO BRONCHOSCOPY WITH ENDOBRONCHIAL ULTRASOUND;  Surgeon: Ivin Poot, MD;  Location[facility];  Service: Thoracic;  Laterality: N/A;    Family History  Problem Relation Age of Onset  . Heart disease Sister     cerebral vascular disease also  . Heart disease Brother     cerebral vascular disease also  . Heart disease Brother     cerebral vascular disease  . Heart disease Sister     cebreal vascular disease also  . Heart disease Sister     cebreal vascular diease also  . Heart disease Sister     cerebral vascular diease also  . Colon cancer Neg Hx   . Esophageal cancer Neg Hx   . Rectal cancer Neg Hx   . Stomach cancer Father     Allergies  Allergen Reactions  . Ciprofloxacin Anaphylaxis  Current Outpatient Prescriptions on File Prior to Visit  Medication Sig Dispense Refill  . anastrozole (ARIMIDEX) 1 MG tablet Take 1 tablet (1 mg total) by mouth daily. 30 tablet 6  . aspirin EC 81 MG tablet Take 81 mg by mouth daily.    . Calcium-Vitamin D (CALTRATE 600 PLUS-VIT D PO) Take by mouth.    . cetirizine (ZYRTEC) 10 MG tablet Take 10 mg by mouth every other day.     . Multiple Vitamin (MULTIVITAMIN WITH MINERALS) TABS tablet Take 1 tablet by mouth daily.    . Probiotic Product (PROBIOTIC DAILY PO) Take 1 capsule by mouth once. Takes 1 a day    . vitamin B-12 (CYANOCOBALAMIN) 100 MCG tablet Take 1,000 mcg by mouth daily.      No current facility-administered medications on file prior to visit.    BP 122/80 mmHg  Pulse 79  Temp(Src) 97.8 F (36.6 C) (Oral)  Resp 18  Ht 5\' 2"  (1.575 m)  Wt 130 lb (58.968 kg)  BMI 23.77 kg/m2  SpO2 96%     Review of Systems  Constitutional: Negative.   HENT: Negative for congestion, dental problem, hearing loss, rhinorrhea,  sinus pressure, sore throat and tinnitus.   Eyes: Negative for pain, discharge and visual disturbance.  Respiratory: Negative for cough and shortness of breath.   Cardiovascular: Negative for chest pain, palpitations and leg swelling.  Gastrointestinal: Negative for nausea, vomiting, abdominal pain, diarrhea, constipation, blood in stool and abdominal distention.  Genitourinary: Negative for dysuria, urgency, frequency, hematuria, flank pain, vaginal bleeding, vaginal discharge, difficulty urinating, vaginal pain and pelvic pain.  Musculoskeletal: Negative for joint swelling, arthralgias and gait problem.  Skin: Negative for rash.  Neurological: Negative for dizziness, syncope, speech difficulty, weakness, numbness and headaches.  Hematological: Negative for adenopathy.  Psychiatric/Behavioral: Negative for behavioral problems, dysphoric mood and agitation. The patient is nervous/anxious.        Objective:   Physical Exam  Constitutional: She is oriented to person, place, and time. She appears well-developed and well-nourished.  HENT:  Head: Normocephalic.  Right Ear: External ear normal.  Left Ear: External ear normal.  Mouth/Throat: Oropharynx is clear and moist.  Eyes: Conjunctivae and EOM are normal. Pupils are equal, round, and reactive to light.  Neck: Normal range of motion. Neck supple. No thyromegaly present.  Cardiovascular: Normal rate, regular rhythm, normal heart sounds and intact distal pulses.   Pulmonary/Chest: Effort normal and breath sounds normal.  Abdominal: Soft. Bowel sounds are normal. She exhibits no mass. There is no tenderness.  Musculoskeletal: Normal range of motion.  Lymphadenopathy:    She has no cervical adenopathy.  Neurological: She is alert and oriented to person, place, and time.  Skin: Skin is warm and dry. No rash noted.  Psychiatric: She has a normal mood and affect. Her behavior is normal.          Assessment & Plan:   Stage IV breast  cancer.  Follow-up oncology Hypertension, well-controlled Statin therapy.  This has been for secondary prevention due to a history of CVD.  Options discussed-she wishes to resume statin therapy, although patient was told there is not a strong indication for therapy at this time.  She has been on a low-dose simvastatin regimen of 5 mg.  This will be resumed.  She was told that it is important to maintain her weight, and she was encouraged to relax her restricted diet  Return in 4-6 months for follow-up

## 2014-06-14 ENCOUNTER — Telehealth: Payer: Self-pay | Admitting: Medical Oncology

## 2014-06-14 ENCOUNTER — Other Ambulatory Visit: Payer: Self-pay | Admitting: Medical Oncology

## 2014-06-14 NOTE — Telephone Encounter (Signed)
Per Dr Kirkland Hun ,pt dental work will not be completed until march 7 . Dr Julien Nordmann notified.

## 2014-06-17 ENCOUNTER — Telehealth: Payer: Self-pay

## 2014-06-17 NOTE — Telephone Encounter (Signed)
Returning pt call of 1010 am. Per Diane's note Dr Julien Nordmann is notified that dental work will not be completed until march 7. Any other questions please call.

## 2014-06-17 NOTE — Telephone Encounter (Signed)
Pt called back to clarify she still needs to come for the NM tests on 2/26. Instructed her to keep the appts on the 26th.

## 2014-06-25 ENCOUNTER — Ambulatory Visit (HOSPITAL_COMMUNITY)
Admission: RE | Admit: 2014-06-25 | Discharge: 2014-06-25 | Disposition: A | Discharge status: Home / Self Care | Payer: Medicare Other | Source: Ambulatory Visit | Attending: Internal Medicine | Admitting: Internal Medicine

## 2014-06-25 ENCOUNTER — Encounter (HOSPITAL_COMMUNITY): Payer: Self-pay

## 2014-06-25 ENCOUNTER — Encounter (HOSPITAL_COMMUNITY)
Admission: RE | Admit: 2014-06-25 | Discharge: 2014-06-25 | Disposition: A | Discharge status: Home / Self Care | Payer: Medicare Other | Source: Ambulatory Visit | Attending: Internal Medicine | Admitting: Internal Medicine

## 2014-06-25 ENCOUNTER — Other Ambulatory Visit (HOSPITAL_BASED_OUTPATIENT_CLINIC_OR_DEPARTMENT_OTHER): Payer: Medicare Other

## 2014-06-25 DIAGNOSIS — C7951 Secondary malignant neoplasm of bone: Secondary | ICD-10-CM | POA: Diagnosis not present

## 2014-06-25 DIAGNOSIS — M542 Cervicalgia: Secondary | ICD-10-CM | POA: Diagnosis not present

## 2014-06-25 DIAGNOSIS — Z08 Encounter for follow-up examination after completed treatment for malignant neoplasm: Secondary | ICD-10-CM | POA: Diagnosis not present

## 2014-06-25 DIAGNOSIS — M25512 Pain in left shoulder: Secondary | ICD-10-CM | POA: Insufficient documentation

## 2014-06-25 DIAGNOSIS — Z923 Personal history of irradiation: Secondary | ICD-10-CM | POA: Diagnosis not present

## 2014-06-25 DIAGNOSIS — C50911 Malignant neoplasm of unspecified site of right female breast: Secondary | ICD-10-CM | POA: Diagnosis not present

## 2014-06-25 DIAGNOSIS — R918 Other nonspecific abnormal finding of lung field: Secondary | ICD-10-CM | POA: Diagnosis not present

## 2014-06-25 DIAGNOSIS — R102 Pelvic and perineal pain: Secondary | ICD-10-CM | POA: Insufficient documentation

## 2014-06-25 DIAGNOSIS — R599 Enlarged lymph nodes, unspecified: Secondary | ICD-10-CM | POA: Diagnosis not present

## 2014-06-25 DIAGNOSIS — M25511 Pain in right shoulder: Secondary | ICD-10-CM | POA: Insufficient documentation

## 2014-06-25 DIAGNOSIS — Z853 Personal history of malignant neoplasm of breast: Secondary | ICD-10-CM | POA: Diagnosis not present

## 2014-06-25 DIAGNOSIS — N289 Disorder of kidney and ureter, unspecified: Secondary | ICD-10-CM | POA: Diagnosis not present

## 2014-06-25 DIAGNOSIS — C50919 Malignant neoplasm of unspecified site of unspecified female breast: Secondary | ICD-10-CM | POA: Insufficient documentation

## 2014-06-25 DIAGNOSIS — R933 Abnormal findings on diagnostic imaging of other parts of digestive tract: Secondary | ICD-10-CM | POA: Diagnosis not present

## 2014-06-25 DIAGNOSIS — R590 Localized enlarged lymph nodes: Secondary | ICD-10-CM | POA: Diagnosis not present

## 2014-06-25 DIAGNOSIS — K5730 Diverticulosis of large intestine without perforation or abscess without bleeding: Secondary | ICD-10-CM | POA: Diagnosis not present

## 2014-06-25 LAB — COMPREHENSIVE METABOLIC PANEL (CC13)
ALBUMIN: 3.8 g/dL (ref 3.5–5.0)
ALK PHOS: 76 U/L (ref 40–150)
ALT: 16 U/L (ref 0–55)
AST: 18 U/L (ref 5–34)
Anion Gap: 12 mEq/L — ABNORMAL HIGH (ref 3–11)
BILIRUBIN TOTAL: 0.43 mg/dL (ref 0.20–1.20)
BUN: 13.5 mg/dL (ref 7.0–26.0)
CHLORIDE: 106 meq/L (ref 98–109)
CO2: 25 mEq/L (ref 22–29)
Calcium: 9.5 mg/dL (ref 8.4–10.4)
Creatinine: 0.8 mg/dL (ref 0.6–1.1)
EGFR: 73 mL/min/{1.73_m2} — ABNORMAL LOW (ref >90)
GLUCOSE: 110 mg/dL (ref 70–140)
POTASSIUM: 4.2 meq/L (ref 3.5–5.1)
SODIUM: 142 meq/L (ref 136–145)
TOTAL PROTEIN: 6.9 g/dL (ref 6.4–8.3)

## 2014-06-25 LAB — CBC WITH DIFFERENTIAL/PLATELET
BASO%: 1.3 % (ref 0.0–2.0)
BASOS ABS: 0.1 10*3/uL (ref 0.0–0.1)
EOS ABS: 0.4 10*3/uL (ref 0.0–0.5)
EOS%: 8.2 % — AB (ref 0.0–7.0)
HCT: 44.2 % (ref 34.8–46.6)
HEMOGLOBIN: 14.4 g/dL (ref 11.6–15.9)
LYMPH%: 34 % (ref 14.0–49.7)
MCH: 31.6 pg (ref 25.1–34.0)
MCHC: 32.5 g/dL (ref 31.5–36.0)
MCV: 97.3 fL (ref 79.5–101.0)
MONO#: 0.3 10*3/uL (ref 0.1–0.9)
MONO%: 6 % (ref 0.0–14.0)
NEUT#: 2.4 10*3/uL (ref 1.5–6.5)
NEUT%: 50.5 % (ref 38.4–76.8)
Platelets: 149 10*3/uL (ref 145–400)
RBC: 4.55 10*6/uL (ref 3.70–5.45)
RDW: 12.9 % (ref 11.2–14.5)
WBC: 4.8 10*3/uL (ref 3.9–10.3)
lymph#: 1.6 10*3/uL (ref 0.9–3.3)

## 2014-06-25 LAB — CANCER ANTIGEN 27.29: CA 27.29: 44 U/mL — ABNORMAL HIGH (ref 0–39)

## 2014-06-25 MED ORDER — IOHEXOL 300 MG/ML  SOLN
100.0000 mL | Freq: Once | INTRAMUSCULAR | Status: AC | PRN
  Administered 2014-06-25: 100 mL via INTRAVENOUS

## 2014-06-25 MED ORDER — TECHNETIUM TC 99M MEDRONATE IV KIT
25.0000 | PACK | Freq: Once | INTRAVENOUS | Status: AC | PRN
  Administered 2014-06-25: 26.1 via INTRAVENOUS

## 2014-06-30 ENCOUNTER — Telehealth: Payer: Self-pay | Admitting: Internal Medicine

## 2014-06-30 ENCOUNTER — Encounter: Payer: Self-pay | Admitting: Internal Medicine

## 2014-06-30 ENCOUNTER — Ambulatory Visit (HOSPITAL_BASED_OUTPATIENT_CLINIC_OR_DEPARTMENT_OTHER): Payer: Medicare Other | Admitting: Internal Medicine

## 2014-06-30 VITALS — BP 153/72 | HR 80 | Temp 97.0°F | Resp 18 | Ht 62.0 in | Wt 133.7 lb

## 2014-06-30 DIAGNOSIS — C50911 Malignant neoplasm of unspecified site of right female breast: Secondary | ICD-10-CM

## 2014-06-30 DIAGNOSIS — Z853 Personal history of malignant neoplasm of breast: Secondary | ICD-10-CM | POA: Diagnosis not present

## 2014-06-30 DIAGNOSIS — C781 Secondary malignant neoplasm of mediastinum: Secondary | ICD-10-CM

## 2014-06-30 DIAGNOSIS — C7951 Secondary malignant neoplasm of bone: Secondary | ICD-10-CM

## 2014-06-30 NOTE — Progress Notes (Signed)
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 The patient has a history of right breast carcinoma status post lumpectomy followed by radiotherapy in 2007 diagnosed in [place] and the patient declined any further treatment at that time.   PRIOR THERAPY: None  CURRENT THERAPY: Arimidex 1 mg by mouth daily. Started 01/22/2014  INTERVAL HISTORY: Erica Young 74 y.o. female returns to the clinic today for followup visit. The patient is feeling much better today and has significant improvement in her condition since starting treatment with Arimidex. She is still wondering why she feels very good if she has metastatic disease especially after starting the hormonal therapy. She denied having any significant weight loss or night sweats. She has no nausea or vomiting. The patient denied having any significant fever or chills. She denied having any significant adverse effects from the treatment with Arimidex. She has not completed the dental workup needed before starting treatment with Xgeva.. She had repeat CT scan of the chest, abdomen and pelvis as well as bone scan performed recently and she is here for evaluation and discussion of her scan results.  MEDICAL HISTORY: Past Medical History  Diagnosis Date  . HYPERLIPIDEMIA 03/03/2010  . CEREBROVASCULAR DISEASE 03/03/2010  . DIVERTICULOSIS, COLON 03/03/2010  . Low back pain   . Right leg pain   . History of cerebral artery stenosis     right middle  . Mastoiditis     noted on brain MRI  . Foramen ovale     positive bubble study  . Diverticulitis   . Fatty liver 08/02/09    as per U/S done by [facility]  . Internal hemorrhoid   . Anxiety   . Hypertension   . Ulcer   . Stroke Oct. 2011    TIA  .  HYPOTHYROIDISM 03/03/2010    no longer on meds  . Shortness of breath   . Headache(784.0)     she thinks sinus headaches  . Rib fracture   . Cancer 2007    right breat ca  , lumpectomy and radiation tx (declined chemo and additional prophylactic meds)    ALLERGIES:  is allergic to ciprofloxacin.  MEDICATIONS:  Current Outpatient Prescriptions  Medication Sig Dispense Refill  . ALPRAZolam (XANAX) 0.25 MG tablet Take 1 tablet (0.25 mg total) by mouth 3 (three) times daily as needed. for anxiety 90 tablet 2  . anastrozole (ARIMIDEX) 1 MG tablet Take 1 tablet (1 mg total) by mouth daily. 30 tablet 6  . aspirin EC 81 MG tablet Take 81 mg by mouth daily.    . Calcium-Vitamin D (CALTRATE 600 PLUS-VIT D PO) Take by mouth.    . cetirizine (ZYRTEC) 10 MG tablet Take 10 mg by mouth every other day.     . Cyanocobalamin (VITAMIN B 12 PO) Take 100 mcg by mouth daily.    Marland Kitchen diltiazem (CARDIZEM CD) 120 MG 24 hr capsule Take 1 capsule (120 mg total) by mouth daily. 90 capsule 4  . Multiple Vitamin (MULTIVITAMIN WITH MINERALS) TABS tablet Take 1 tablet by mouth daily.    . Probiotic Product (PROBIOTIC DAILY PO) Take 1 capsule by mouth once. Takes 1 a day    . simvastatin (ZOCOR) 5 MG tablet Take 1 tablet (5 mg total) by mouth daily. 90 tablet  2   No current facility-administered medications for this visit.    SURGICAL HISTORY:  Past Surgical History  Procedure Laterality Date  . Cholecystectomy    . Breast lumpectomy      right  . Tonsillectomy    . Colonoscopy    . Tubal ligation    . Shoulder surgery      right shoulder (dislocation)  . Video bronchoscopy with endobronchial ultrasound N/A 01/07/2014    Procedure: VIDEO BRONCHOSCOPY WITH ENDOBRONCHIAL ULTRASOUND;  Surgeon: Ivin Poot, MD;  Location: [facility] OR;  Service: Thoracic;  Laterality: N/A;    REVIEW OF SYSTEMS:  Constitutional: negative Eyes: negative Ears, nose, mouth, throat, and face: negative Respiratory:  negative Cardiovascular: negative Gastrointestinal: negative Genitourinary:negative Integument/breast: negative Hematologic/lymphatic: negative Musculoskeletal:negative Neurological: negative Behavioral/Psych: negative Endocrine: negative Allergic/Immunologic: negative   PHYSICAL EXAMINATION: General appearance: alert, cooperative and no distress Head: Normocephalic, without obvious abnormality, atraumatic Neck: no adenopathy, no JVD, supple, symmetrical, trachea midline and thyroid not enlarged, symmetric, no tenderness/mass/nodules Lymph nodes: Cervical, supraclavicular, and axillary nodes normal. Resp: clear to auscultation bilaterally Back: symmetric, no curvature. ROM normal. No CVA tenderness. Cardio: regular rate and rhythm, S1, S2 normal, no murmur, click, rub or gallop GI: soft, non-tender; bowel sounds normal; no masses,  no organomegaly Extremities: extremities normal, atraumatic, no cyanosis or edema Neurologic: Alert and oriented X 3, normal strength and tone. Normal symmetric reflexes. Normal coordination and gait  ECOG PERFORMANCE STATUS: 1 - Symptomatic but completely ambulatory  Blood pressure 153/72, pulse 80, temperature 97 F (36.1 C), temperature source Oral, resp. rate 18, height 5\' 2"  (1.575 m), weight 133 lb 11.2 oz (60.646 kg).  LABORATORY DATA: Lab Results  Component Value Date   WBC 4.8 06/25/2014   HGB 14.4 06/25/2014   HCT 44.2 06/25/2014   MCV 97.3 06/25/2014   PLT 149 06/25/2014      Chemistry      Component Value Date/Time   NA 142 06/25/2014 0751   NA 138 01/06/2014 0910   K 4.2 06/25/2014 0751   K 4.3 01/06/2014 0910   CL 104 01/06/2014 0910   CO2 25 06/25/2014 0751   CO2 22 01/06/2014 0910   BUN 13.5 06/25/2014 0751   BUN 9 01/06/2014 0910   CREATININE 0.8 06/25/2014 0751   CREATININE 0.56 01/06/2014 0910      Component Value Date/Time   CALCIUM 9.5 06/25/2014 0751   CALCIUM 9.0 01/06/2014 0910   ALKPHOS 76 06/25/2014 0751    ALKPHOS 87 01/06/2014 0910   AST 18 06/25/2014 0751   AST 17 01/06/2014 0910   ALT 16 06/25/2014 0751   ALT 16 01/06/2014 0910   BILITOT 0.43 06/25/2014 0751   BILITOT 0.3 01/06/2014 0910       RADIOGRAPHIC STUDIES: Ct Chest W Contrast  06/25/2014   CLINICAL DATA:  Subsequent evaluation of 74 year old female diagnosed with right-sided breast cancer in 2007. Status post right lumpectomy. Recurrence diagnosed in 12/2013, now status post radiation therapy which is complete. Central chest discomfort. Cough and shortness of breath. Epigastric pain, nausea and constipation.  EXAM: CT CHEST, ABDOMEN, AND PELVIS WITH CONTRAST  TECHNIQUE: Multidetector CT imaging of the chest, abdomen and pelvis was performed following the standard protocol during bolus administration of intravenous contrast.  CONTRAST:  122mL OMNIPAQUE IOHEXOL 300 MG/ML  SOLN  COMPARISON:  PET-CT 12/28/2013.  FINDINGS: CT CHEST FINDINGS  Mediastinum/Lymph Nodes: Again noted are multiple enlarged mediastinal and bilateral hilar lymph nodes. Specifically, there is a 14 mm short axis  superior mediastinal lymph node adjacent to the right side of the esophagus which measured 13 mm on prior study 12/28/2013. A large amount of amorphous paratracheal lymphadenopathy is noted adjacent to the proximal trachea at the level of the aortic arch, minimally increased compared to the prior PET-CT. Low right paratracheal lymphadenopathy measuring 17 mm in short axis, similar to the prior examination. Low left paratracheal lymphadenopathy measuring 16 mm in short axis, previously 15 mm. Bilateral hilar lymphadenopathy, difficult to directly compare with the prior noncontrast CT examination, measuring up to 14 mm on the right and 1 cm on the left in short axis. Enlarged right internal mammary lymph node measuring 1 cm in short axis (previously 7 mm). Heart size is normal. There is no significant pericardial fluid, thickening or pericardial calcification. Esophagus  is unremarkable in appearance. No axillary lymphadenopathy. Status post right axillary nodal dissection.  Lungs/Pleura: Multiple small pulmonary nodules and areas of pleural-based nodularity are generally similar to prior examinations. Specific examples include a 7 mm left lower lobe nodule (image 45 of series 4), which is unchanged compared to 05/05/2013, and a 9 mm right lower lobe nodule (image 31 of series 4), also unchanged compared to 05/05/2013. No definite new suspicious appearing pulmonary nodules or masses are otherwise noted. Linear opacity in the right lower lobe similar to the prior examination, presumably an area of scarring. No acute consolidative airspace disease. No pleural effusions.  Musculoskeletal/Soft Tissues: Expansile lytic lesion in the posterior aspect of the left fifth rib (image 18 of series 4) appears to represent a healing pathologic fracture. No other definite aggressive appearing lytic or blastic lesions are noted in the visualized portions of the skeleton. Notably, the hypermetabolism in T7 on prior PET-CT has no CT correlate on today's examination.  CT ABDOMEN AND PELVIS FINDINGS  Hepatobiliary: No suspicious cystic or solid hepatic lesions. No intrahepatic biliary ductal dilatation. Status post cholecystectomy. Common bile duct is mildly dilated measuring 8 mm in the porta hepatis, which likely reflects post cholecystectomy physiology.  Pancreas: Unremarkable.  Spleen: Unremarkable.  Adrenals/Urinary Tract: Multiple low-attenuation lesions in the right kidney, the majority of which are compatible with simple cysts, largest of which measures 18 mm in the interpolar region. Several lesions are too small to definitively characterize, but are also favored to represent tiny cysts. Left kidney is normal in appearance. Bilateral adrenal glands are normal in appearance. No hydroureteronephrosis. Urinary bladder is normal in appearance.  Stomach/Bowel: The appearance of the stomach is  normal. No pathologic dilatation of small bowel or colon. Numerous colonic diverticulae are noted, without surrounding inflammatory changes to suggest an acute diverticulitis at this time. Notably, however, there is an area of slightly mass like thickening in the mid to distal sigmoid colon, best appreciated on image 102 of series 2. Normal appendix.  Vascular/Lymphatic: Atherosclerosis throughout the visualized vasculature, without evidence of aneurysm or dissection. No lymphadenopathy noted in the abdomen or pelvis.  Reproductive: Uterus and ovaries are atrophic.  Other: No significant volume of ascites.  No pneumoperitoneum.  Musculoskeletal: There are no aggressive appearing lytic or blastic lesions noted in the visualized portions of the skeleton. Specifically, previously noted areas of hypermetabolism in L1 and L5 on prior PET-CT have no CT correlate on today's examination.  IMPRESSION: 1. Today's study demonstrates a generally stable burden of disease in the thorax, with widespread mediastinal and bilateral hilar lymphadenopathy, right internal mammary adenopathy, and multiple small pulmonary nodules scattered throughout the lungs bilaterally, as detailed above. 2. Previously noted areas  of skeletal hypermetabolism in T7, L1 and L5 have no CT correlate on today's examination. However, there does appear to be a healing pathologic fracture of the posterior aspect of the left fifth rib on today's study (visualized on the prior examination). No new osseous lesions are identified. 3. No new signs of metastatic disease to the abdomen or pelvis. 4. Mass like thickening of the mid to distal sigmoid colon. This is nonspecific, and is in an area of extensive diverticulosis. This thickening may simply be related to chronic diverticular related inflammation resulting in colonic wall hypertrophy, however, the possibility of underlying neoplasm is not excluded, and correlation with nonemergent colonoscopy is suggested in  the near future if clinically appropriate. 5. Additional incidental findings, as above.   Electronically Signed   By: Vinnie Langton M.D.   On: 06/25/2014 11:27   Nm Bone Scan Whole Body  06/25/2014   CLINICAL DATA:  74 year old female with breast cancer. Bilateral neck shoulder and pelvic pain. Subsequent encounter.  EXAM: NUCLEAR MEDICINE WHOLE BODY BONE SCAN  TECHNIQUE: Whole body anterior and posterior images were obtained approximately 3 hours after intravenous injection of radiopharmaceutical.  RADIOPHARMACEUTICALS:  26.1 [facility] Technetium-99 MDP  COMPARISON:  CT chest abdomen and pelvis from today reported separately. PET-CT 12/28/2013.  FINDINGS: Expected radiotracer activity in the kidneys and bladder.  Heterogeneous radiotracer activity in the bilateral upper ribs, including at the fifth posterior rib level where a pathologic fracture was noted on 12/28/2013. That lesion appears partially healed on the CT today which is reported separately.  There is a 2-3 cm focus of moderate increased radiotracer activity along the right posterior calvarium.  There is heterogeneous activity in the thoracic and lumbar spine with moderate foci of increased activity at the T7 level, and L1 level.  There is an elongated focus of activity in the proximal right femoral shaft.  There is been more degenerative appearing activity about both shoulders, elbows, wrists, and knees. Shoulder metastatic disease would be difficult to exclude.  IMPRESSION: Positive for osseous metastatic disease on this exam with no prior bone scan for comparison.  Metastases in the right skull, right femoral shaft, right fifth rib, T7, and L1 vertebral levels.   Electronically Signed   By: Genevie Ann M.D.   On: 06/25/2014 13:18   Ct Abdomen Pelvis W Contrast  06/25/2014   CLINICAL DATA:  Subsequent evaluation of 74 year old female diagnosed with right-sided breast cancer in 2007. Status post right lumpectomy. Recurrence diagnosed in 12/2013, now  status post radiation therapy which is complete. Central chest discomfort. Cough and shortness of breath. Epigastric pain, nausea and constipation.  EXAM: CT CHEST, ABDOMEN, AND PELVIS WITH CONTRAST  TECHNIQUE: Multidetector CT imaging of the chest, abdomen and pelvis was performed following the standard protocol during bolus administration of intravenous contrast.  CONTRAST:  155mL OMNIPAQUE IOHEXOL 300 MG/ML  SOLN  COMPARISON:  PET-CT 12/28/2013.  FINDINGS: CT CHEST FINDINGS  Mediastinum/Lymph Nodes: Again noted are multiple enlarged mediastinal and bilateral hilar lymph nodes. Specifically, there is a 14 mm short axis superior mediastinal lymph node adjacent to the right side of the esophagus which measured 13 mm on prior study 12/28/2013. A large amount of amorphous paratracheal lymphadenopathy is noted adjacent to the proximal trachea at the level of the aortic arch, minimally increased compared to the prior PET-CT. Low right paratracheal lymphadenopathy measuring 17 mm in short axis, similar to the prior examination. Low left paratracheal lymphadenopathy measuring 16 mm in short axis, previously 15 mm. Bilateral  hilar lymphadenopathy, difficult to directly compare with the prior noncontrast CT examination, measuring up to 14 mm on the right and 1 cm on the left in short axis. Enlarged right internal mammary lymph node measuring 1 cm in short axis (previously 7 mm). Heart size is normal. There is no significant pericardial fluid, thickening or pericardial calcification. Esophagus is unremarkable in appearance. No axillary lymphadenopathy. Status post right axillary nodal dissection.  Lungs/Pleura: Multiple small pulmonary nodules and areas of pleural-based nodularity are generally similar to prior examinations. Specific examples include a 7 mm left lower lobe nodule (image 45 of series 4), which is unchanged compared to 05/05/2013, and a 9 mm right lower lobe nodule (image 31 of series 4), also unchanged  compared to 05/05/2013. No definite new suspicious appearing pulmonary nodules or masses are otherwise noted. Linear opacity in the right lower lobe similar to the prior examination, presumably an area of scarring. No acute consolidative airspace disease. No pleural effusions.  Musculoskeletal/Soft Tissues: Expansile lytic lesion in the posterior aspect of the left fifth rib (image 18 of series 4) appears to represent a healing pathologic fracture. No other definite aggressive appearing lytic or blastic lesions are noted in the visualized portions of the skeleton. Notably, the hypermetabolism in T7 on prior PET-CT has no CT correlate on today's examination.  CT ABDOMEN AND PELVIS FINDINGS  Hepatobiliary: No suspicious cystic or solid hepatic lesions. No intrahepatic biliary ductal dilatation. Status post cholecystectomy. Common bile duct is mildly dilated measuring 8 mm in the porta hepatis, which likely reflects post cholecystectomy physiology.  Pancreas: Unremarkable.  Spleen: Unremarkable.  Adrenals/Urinary Tract: Multiple low-attenuation lesions in the right kidney, the majority of which are compatible with simple cysts, largest of which measures 18 mm in the interpolar region. Several lesions are too small to definitively characterize, but are also favored to represent tiny cysts. Left kidney is normal in appearance. Bilateral adrenal glands are normal in appearance. No hydroureteronephrosis. Urinary bladder is normal in appearance.  Stomach/Bowel: The appearance of the stomach is normal. No pathologic dilatation of small bowel or colon. Numerous colonic diverticulae are noted, without surrounding inflammatory changes to suggest an acute diverticulitis at this time. Notably, however, there is an area of slightly mass like thickening in the mid to distal sigmoid colon, best appreciated on image 102 of series 2. Normal appendix.  Vascular/Lymphatic: Atherosclerosis throughout the visualized vasculature, without  evidence of aneurysm or dissection. No lymphadenopathy noted in the abdomen or pelvis.  Reproductive: Uterus and ovaries are atrophic.  Other: No significant volume of ascites.  No pneumoperitoneum.  Musculoskeletal: There are no aggressive appearing lytic or blastic lesions noted in the visualized portions of the skeleton. Specifically, previously noted areas of hypermetabolism in L1 and L5 on prior PET-CT have no CT correlate on today's examination.  IMPRESSION: 1. Today's study demonstrates a generally stable burden of disease in the thorax, with widespread mediastinal and bilateral hilar lymphadenopathy, right internal mammary adenopathy, and multiple small pulmonary nodules scattered throughout the lungs bilaterally, as detailed above. 2. Previously noted areas of skeletal hypermetabolism in T7, L1 and L5 have no CT correlate on today's examination. However, there does appear to be a healing pathologic fracture of the posterior aspect of the left fifth rib on today's study (visualized on the prior examination). No new osseous lesions are identified. 3. No new signs of metastatic disease to the abdomen or pelvis. 4. Mass like thickening of the mid to distal sigmoid colon. This is nonspecific, and is in  an area of extensive diverticulosis. This thickening may simply be related to chronic diverticular related inflammation resulting in colonic wall hypertrophy, however, the possibility of underlying neoplasm is not excluded, and correlation with nonemergent colonoscopy is suggested in the near future if clinically appropriate. 5. Additional incidental findings, as above.   Electronically Signed   By: Vinnie Langton M.D.   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 Her disease is mainly in the bone as well as the mediastinum. The patient is currently asymptomatic and has no  visceral crisis. She is currently on treatment with Arimidex 1 mg by mouth daily since 01/22/2014 and tolerating her treatment fairly well with no significant adverse effects. The patient noticed significant improvement in her symptoms and general condition after starting the treatment with the hormonal therapy. The recent CT scan of the Chest, Abdomen and pelvis as well as a bone scan showed stable disease. The CA 27.29 continues to improve. I recommended for the patient to continue her current hormonal therapy. She would come back for follow-up visit in 3 months for reevaluation with repeat CBC, comprehensive metabolic panel and CA 86.75.  2) Metastatic bone disease: I am still awaiting the dental clearance before starting Xgeva. I also advised the patient to take a daily calcium and vitamin D supplements.  3) followup visit: The patient would come back for followup visit in 3 months for evaluation and management any adverse effect of her treatment.  She was advised to call immediately if she has any concerning symptoms in the interval.  The patient voices understanding of current disease status and treatment options and is in agreement with the current care plan.  All questions were answered. The patient knows to call the clinic with any problems, questions or concerns. We can certainly see the patient much sooner if necessary.  I spent 15 minutes counseling the patient face to face. The total time spent in the appointment was 25 minutes.  Disclaimer: This note was dictated with voice recognition software. Similar sounding words can inadvertently be transcribed and may not be corrected upon review.

## 2014-06-30 NOTE — Telephone Encounter (Signed)
gv and printed appt sched adn avs for pt for June

## 2014-07-06 ENCOUNTER — Telehealth: Payer: Self-pay | Admitting: Medical Oncology

## 2014-07-06 NOTE — Telephone Encounter (Signed)
Dental clearance note sent to [facility].

## 2014-07-07 ENCOUNTER — Other Ambulatory Visit: Payer: Self-pay | Admitting: Internal Medicine

## 2014-07-12 ENCOUNTER — Telehealth: Payer: Self-pay | Admitting: Medical Oncology

## 2014-07-12 NOTE — Telephone Encounter (Signed)
-----   Message from Curt Bears, MD sent at 07/07/2014  8:13 AM EST ----- Will start X geva when I see her next visit ----- Message -----    From: Ardeen Garland, RN    Sent: 07/06/2014   2:27 PM      To: Curt Bears, MD  Dental clearance letter received from Dr Adornetto-in your basket Anything I need to do?

## 2014-07-28 ENCOUNTER — Other Ambulatory Visit: Payer: Self-pay | Admitting: Medical Oncology

## 2014-07-28 ENCOUNTER — Telehealth: Payer: Self-pay | Admitting: Internal Medicine

## 2014-07-28 DIAGNOSIS — C7951 Secondary malignant neoplasm of bone: Secondary | ICD-10-CM

## 2014-07-28 DIAGNOSIS — C50911 Malignant neoplasm of unspecified site of right female breast: Secondary | ICD-10-CM

## 2014-07-28 NOTE — Telephone Encounter (Signed)
spoke with patient and advised on 4.1 appt....pt will get new sched at visit....pt ok and aware

## 2014-07-30 ENCOUNTER — Other Ambulatory Visit (HOSPITAL_BASED_OUTPATIENT_CLINIC_OR_DEPARTMENT_OTHER): Payer: Medicare Other

## 2014-07-30 ENCOUNTER — Ambulatory Visit (HOSPITAL_BASED_OUTPATIENT_CLINIC_OR_DEPARTMENT_OTHER): Payer: Medicare Other

## 2014-07-30 DIAGNOSIS — C50911 Malignant neoplasm of unspecified site of right female breast: Secondary | ICD-10-CM | POA: Diagnosis not present

## 2014-07-30 DIAGNOSIS — C7951 Secondary malignant neoplasm of bone: Secondary | ICD-10-CM

## 2014-07-30 DIAGNOSIS — Z853 Personal history of malignant neoplasm of breast: Secondary | ICD-10-CM

## 2014-07-30 DIAGNOSIS — C781 Secondary malignant neoplasm of mediastinum: Secondary | ICD-10-CM

## 2014-07-30 LAB — CBC WITH DIFFERENTIAL/PLATELET
BASO%: 0.4 % (ref 0.0–2.0)
Basophils Absolute: 0 10*3/uL (ref 0.0–0.1)
EOS%: 6.5 % (ref 0.0–7.0)
Eosinophils Absolute: 0.3 10*3/uL (ref 0.0–0.5)
HCT: 43.2 % (ref 34.8–46.6)
HGB: 14.5 g/dL (ref 11.6–15.9)
LYMPH%: 35.4 % (ref 14.0–49.7)
MCH: 32.9 pg (ref 25.1–34.0)
MCHC: 33.6 g/dL (ref 31.5–36.0)
MCV: 98 fL (ref 79.5–101.0)
MONO#: 0.3 10*3/uL (ref 0.1–0.9)
MONO%: 6.3 % (ref 0.0–14.0)
NEUT#: 2.5 10*3/uL (ref 1.5–6.5)
NEUT%: 51.4 % (ref 38.4–76.8)
Platelets: 152 10*3/uL (ref 145–400)
RBC: 4.41 10*6/uL (ref 3.70–5.45)
RDW: 12.8 % (ref 11.2–14.5)
WBC: 4.8 10*3/uL (ref 3.9–10.3)
lymph#: 1.7 10*3/uL (ref 0.9–3.3)

## 2014-07-30 LAB — COMPREHENSIVE METABOLIC PANEL (CC13)
ALBUMIN: 3.7 g/dL (ref 3.5–5.0)
ALT: 20 U/L (ref 0–55)
AST: 17 U/L (ref 5–34)
Alkaline Phosphatase: 73 U/L (ref 40–150)
Anion Gap: 10 mEq/L (ref 3–11)
BILIRUBIN TOTAL: 0.53 mg/dL (ref 0.20–1.20)
BUN: 14.7 mg/dL (ref 7.0–26.0)
CHLORIDE: 106 meq/L (ref 98–109)
CO2: 25 meq/L (ref 22–29)
CREATININE: 0.8 mg/dL (ref 0.6–1.1)
Calcium: 8.9 mg/dL (ref 8.4–10.4)
EGFR: 70 mL/min/{1.73_m2} — ABNORMAL LOW (ref >90)
GLUCOSE: 114 mg/dL (ref 70–140)
Potassium: 4.6 mEq/L (ref 3.5–5.1)
SODIUM: 141 meq/L (ref 136–145)
TOTAL PROTEIN: 6.8 g/dL (ref 6.4–8.3)

## 2014-07-30 MED ORDER — DENOSUMAB 120 MG/1.7ML ~~LOC~~ SOLN
120.0000 mg | Freq: Once | SUBCUTANEOUS | Status: AC
  Administered 2014-07-30: 120 mg via SUBCUTANEOUS
  Filled 2014-07-30: qty 1.7

## 2014-07-30 NOTE — Patient Instructions (Signed)
Denosumab injection What is this medicine? DENOSUMAB (den oh sue mab) slows bone breakdown. Prolia is used to treat osteoporosis in women after menopause and in men. Xgeva is used to prevent bone fractures and other bone problems caused by cancer bone metastases. Xgeva is also used to treat giant cell tumor of the bone. This medicine may be used for other purposes; ask your health care provider or pharmacist if you have questions. COMMON BRAND NAME(S): Prolia, XGEVA What should I tell my health care provider before I take this medicine? They need to know if you have any of these conditions: -dental disease -eczema -infection or history of infections -kidney disease or on dialysis -low blood calcium or vitamin D -malabsorption syndrome -scheduled to have surgery or tooth extraction -taking medicine that contains denosumab -thyroid or parathyroid disease -an unusual reaction to denosumab, other medicines, foods, dyes, or preservatives -pregnant or trying to get pregnant -breast-feeding How should I use this medicine? This medicine is for injection under the skin. It is given by a health care professional in a hospital or clinic setting. If you are getting Prolia, a special MedGuide will be given to you by the pharmacist with each prescription and refill. Be sure to read this information carefully each time. For Prolia, talk to your pediatrician regarding the use of this medicine in children. Special care may be needed. For Xgeva, talk to your pediatrician regarding the use of this medicine in children. While this drug may be prescribed for children as young as 13 years for selected conditions, precautions do apply. Overdosage: If you think you've taken too much of this medicine contact a poison control center or emergency room at once. Overdosage: If you think you have taken too much of this medicine contact a poison control center or emergency room at once. NOTE: This medicine is only for  you. Do not share this medicine with others. What if I miss a dose? It is important not to miss your dose. Call your doctor or health care professional if you are unable to keep an appointment. What may interact with this medicine? Do not take this medicine with any of the following medications: -other medicines containing denosumab This medicine may also interact with the following medications: -medicines that suppress the immune system -medicines that treat cancer -steroid medicines like prednisone or cortisone This list may not describe all possible interactions. Give your health care provider a list of all the medicines, herbs, non-prescription drugs, or dietary supplements you use. Also tell them if you smoke, drink alcohol, or use illegal drugs. Some items may interact with your medicine. What should I watch for while using this medicine? Visit your doctor or health care professional for regular checks on your progress. Your doctor or health care professional may order blood tests and other tests to see how you are doing. Call your doctor or health care professional if you get a cold or other infection while receiving this medicine. Do not treat yourself. This medicine may decrease your body's ability to fight infection. You should make sure you get enough calcium and vitamin D while you are taking this medicine, unless your doctor tells you not to. Discuss the foods you eat and the vitamins you take with your health care professional. See your dentist regularly. Brush and floss your teeth as directed. Before you have any dental work done, tell your dentist you are receiving this medicine. Do not become pregnant while taking this medicine or for 5 months after stopping   it. Women should inform their doctor if they wish to become pregnant or think they might be pregnant. There is a potential for serious side effects to an unborn child. Talk to your health care professional or pharmacist for more  information. What side effects may I notice from receiving this medicine? Side effects that you should report to your doctor or health care professional as soon as possible: -allergic reactions like skin rash, itching or hives, swelling of the face, lips, or tongue -breathing problems -chest pain -fast, irregular heartbeat -feeling faint or lightheaded, falls -fever, chills, or any other sign of infection -muscle spasms, tightening, or twitches -numbness or tingling -skin blisters or bumps, or is dry, peels, or red -slow healing or unexplained pain in the mouth or jaw -unusual bleeding or bruising Side effects that usually do not require medical attention (Report these to your doctor or health care professional if they continue or are bothersome.): -muscle pain -stomach upset, gas This list may not describe all possible side effects. Call your doctor for medical advice about side effects. You may report side effects to FDA at 1-800-FDA-1088. Where should I keep my medicine? This medicine is only given in a clinic, doctor's office, or other health care setting and will not be stored at home. NOTE: This sheet is a summary. It may not cover all possible information. If you have questions about this medicine, talk to your doctor, pharmacist, or health care provider.  2015, Elsevier/Gold Standard. (2011-10-15 12:37:47)  

## 2014-08-13 ENCOUNTER — Other Ambulatory Visit: Payer: Self-pay | Admitting: Internal Medicine

## 2014-08-27 ENCOUNTER — Other Ambulatory Visit (HOSPITAL_BASED_OUTPATIENT_CLINIC_OR_DEPARTMENT_OTHER): Payer: Medicare Other

## 2014-08-27 ENCOUNTER — Ambulatory Visit (HOSPITAL_BASED_OUTPATIENT_CLINIC_OR_DEPARTMENT_OTHER): Payer: Medicare Other

## 2014-08-27 VITALS — BP 140/72 | HR 74 | Temp 98.6°F

## 2014-08-27 DIAGNOSIS — Z853 Personal history of malignant neoplasm of breast: Secondary | ICD-10-CM

## 2014-08-27 DIAGNOSIS — C50911 Malignant neoplasm of unspecified site of right female breast: Secondary | ICD-10-CM | POA: Diagnosis not present

## 2014-08-27 DIAGNOSIS — C7951 Secondary malignant neoplasm of bone: Secondary | ICD-10-CM

## 2014-08-27 DIAGNOSIS — C781 Secondary malignant neoplasm of mediastinum: Secondary | ICD-10-CM

## 2014-08-27 LAB — COMPREHENSIVE METABOLIC PANEL (CC13)
ALT: 17 U/L (ref 0–55)
AST: 16 U/L (ref 5–34)
Albumin: 3.5 g/dL (ref 3.5–5.0)
Alkaline Phosphatase: 66 U/L (ref 40–150)
Anion Gap: 11 mEq/L (ref 3–11)
BUN: 13 mg/dL (ref 7.0–26.0)
CHLORIDE: 108 meq/L (ref 98–109)
CO2: 22 mEq/L (ref 22–29)
Calcium: 8.8 mg/dL (ref 8.4–10.4)
Creatinine: 0.9 mg/dL (ref 0.6–1.1)
EGFR: 64 mL/min/{1.73_m2} — ABNORMAL LOW (ref >90)
Glucose: 172 mg/dl — ABNORMAL HIGH (ref 70–140)
Potassium: 3.8 mEq/L (ref 3.5–5.1)
Sodium: 141 mEq/L (ref 136–145)
Total Bilirubin: 0.45 mg/dL (ref 0.20–1.20)
Total Protein: 6.7 g/dL (ref 6.4–8.3)

## 2014-08-27 MED ORDER — DENOSUMAB 120 MG/1.7ML ~~LOC~~ SOLN
120.0000 mg | Freq: Once | SUBCUTANEOUS | Status: AC
  Administered 2014-08-27: 120 mg via SUBCUTANEOUS
  Filled 2014-08-27: qty 1.7

## 2014-09-01 ENCOUNTER — Ambulatory Visit (INDEPENDENT_AMBULATORY_CARE_PROVIDER_SITE_OTHER): Payer: Medicare Other | Admitting: Internal Medicine

## 2014-09-01 ENCOUNTER — Encounter: Payer: Self-pay | Admitting: Internal Medicine

## 2014-09-01 VITALS — BP 110/70 | HR 78 | Temp 97.5°F | Resp 18 | Ht 62.0 in | Wt 135.0 lb

## 2014-09-01 DIAGNOSIS — E039 Hypothyroidism, unspecified: Secondary | ICD-10-CM

## 2014-09-01 DIAGNOSIS — I10 Essential (primary) hypertension: Secondary | ICD-10-CM

## 2014-09-01 DIAGNOSIS — C50911 Malignant neoplasm of unspecified site of right female breast: Secondary | ICD-10-CM

## 2014-09-01 DIAGNOSIS — Z853 Personal history of malignant neoplasm of breast: Secondary | ICD-10-CM | POA: Diagnosis not present

## 2014-09-01 LAB — TSH: TSH: 3.29 u[IU]/mL (ref 0.35–4.50)

## 2014-09-01 NOTE — Progress Notes (Signed)
Pre visit review using our clinic review tool, if applicable. No additional management support is needed unless otherwise documented below in the visit note. 

## 2014-09-01 NOTE — Patient Instructions (Signed)
It is important that you exercise regularly, at least 20 minutes 3 to 4 times per week.  If you develop chest pain or shortness of breath seek  medical attention.  Limit your sodium (Salt) intake  Please check your blood pressure on a regular basis.  If it is consistently greater than 150/90, please make an office appointment.  Return in 6 months for follow-up

## 2014-09-01 NOTE — Progress Notes (Signed)
Subjective:    Patient ID: Erica Young, female    DOB: Sep 08, 1940, 74 y.o.   MRN: [id number]  HPI  74 year old patient who is seen today for follow-up of hypertension.  She has a history of hypothyroidism, previously treated with thyroid supplement.  She is requesting a follow-up TSH.  Her blood pressure has been stable She is followed closely by oncology due to stage IV right breast cancer.  For the past couple months she has had some generalized bone pain.  She describes as moderate when she first wakes in the morning.  She declines any analgesics. Weight has been stable  Past Medical History  Diagnosis Date  . HYPERLIPIDEMIA 03/03/2010  . CEREBROVASCULAR DISEASE 03/03/2010  . DIVERTICULOSIS, COLON 03/03/2010  . Low back pain   . Right leg pain   . History of cerebral artery stenosis     right middle  . Mastoiditis     noted on brain MRI  . Foramen ovale     positive bubble study  . Diverticulitis   . Fatty liver 08/02/09    as per U/S done by [facility]  . Internal hemorrhoid   . Anxiety   . Hypertension   . Ulcer   . Stroke Oct. 2011    TIA  . HYPOTHYROIDISM 03/03/2010    no longer on meds  . Shortness of breath   . Headache(784.0)     she thinks sinus headaches  . Rib fracture   . Cancer 2007    right breat ca  , lumpectomy and radiation tx (declined chemo and additional prophylactic meds)    History   Social History  . Marital Status: Married    Spouse Name: N/A  . Number of Children: 2  . Years of Education: N/A   Occupational History  . Social Worker--Retired    Social History Main Topics  . Smoking status: Former Smoker -- 0.50 packs/day for 20 years    Types: Cigarettes    Quit date: 05/01/1991  . Smokeless tobacco: Never Used  . Alcohol Use: Yes     Comment: socially  . Drug Use: No  . Sexual Activity: Yes   Other Topics Concern  . Not on file   Social History Narrative   Daily caffeine     Past Surgical History  Procedure  Laterality Date  . Cholecystectomy    . Breast lumpectomy      right  . Tonsillectomy    . Colonoscopy    . Tubal ligation    . Shoulder surgery      right shoulder (dislocation)  . Video bronchoscopy with endobronchial ultrasound N/A 01/07/2014    Procedure: VIDEO BRONCHOSCOPY WITH ENDOBRONCHIAL ULTRASOUND;  Surgeon: Ivin Poot, MD;  Location: [facility] OR;  Service: Thoracic;  Laterality: N/A;    Family History  Problem Relation Age of Onset  . Heart disease Sister     cerebral vascular disease also  . Heart disease Brother     cerebral vascular disease also  . Heart disease Brother     cerebral vascular disease  . Heart disease Sister     cebreal vascular disease also  . Heart disease Sister     cebreal vascular diease also  . Heart disease Sister     cerebral vascular diease also  . Colon cancer Neg Hx   . Esophageal cancer Neg Hx   . Rectal cancer Neg Hx   . Stomach cancer Father     Allergies  Allergen Reactions  . Ciprofloxacin Anaphylaxis    Current Outpatient Prescriptions on File Prior to Visit  Medication Sig Dispense Refill  . ALPRAZolam (XANAX) 0.25 MG tablet TAKE 1 TABLET BY MOUTH THREE TIMES DAILY AS NEEDED FOR ANXIETY 90 tablet 2  . anastrozole (ARIMIDEX) 1 MG tablet TAKE 1 TABLET BY MOUTH DAILY 30 tablet 0  . aspirin EC 81 MG tablet Take 81 mg by mouth daily.    . Calcium-Vitamin D (CALTRATE 600 PLUS-VIT D PO) Take by mouth.    . cetirizine (ZYRTEC) 10 MG tablet Take 10 mg by mouth every other day.     . Cyanocobalamin (VITAMIN B 12 PO) Take 100 mcg by mouth daily.    Marland Kitchen diltiazem (CARDIZEM CD) 120 MG 24 hr capsule Take 1 capsule (120 mg total) by mouth daily. 90 capsule 4  . Multiple Vitamin (MULTIVITAMIN WITH MINERALS) TABS tablet Take 1 tablet by mouth daily.    . Probiotic Product (PROBIOTIC DAILY PO) Take 1 capsule by mouth once. Takes 1 a day    . simvastatin (ZOCOR) 5 MG tablet Take 1 tablet (5 mg total) by mouth daily. 90 tablet 2   No current  facility-administered medications on file prior to visit.    BP 110/70 mmHg  Pulse 78  Temp(Src) 97.5 F (36.4 C) (Oral)  Resp 18  Ht '5\' 2"'$  (1.575 m)  Wt 135 lb (61.236 kg)  BMI 24.69 kg/m2  SpO2 95%     Review of Systems  Constitutional: Negative.   HENT: Negative for congestion, dental problem, hearing loss, rhinorrhea, sinus pressure, sore throat and tinnitus.   Eyes: Negative for pain, discharge and visual disturbance.  Respiratory: Negative for cough and shortness of breath.   Cardiovascular: Negative for chest pain, palpitations and leg swelling.  Gastrointestinal: Negative for nausea, vomiting, abdominal pain, diarrhea, constipation, blood in stool and abdominal distention.  Genitourinary: Negative for dysuria, urgency, frequency, hematuria, flank pain, vaginal bleeding, vaginal discharge, difficulty urinating, vaginal pain and pelvic pain.  Musculoskeletal: Positive for arthralgias. Negative for joint swelling and gait problem.  Skin: Negative for rash.  Neurological: Negative for dizziness, syncope, speech difficulty, weakness, numbness and headaches.  Hematological: Negative for adenopathy.  Psychiatric/Behavioral: Negative for behavioral problems, dysphoric mood and agitation. The patient is not nervous/anxious.        Objective:   Physical Exam  Constitutional: She is oriented to person, place, and time. She appears well-developed and well-nourished.  HENT:  Head: Normocephalic.  Right Ear: External ear normal.  Left Ear: External ear normal.  Mouth/Throat: Oropharynx is clear and moist.  Eyes: Conjunctivae and EOM are normal. Pupils are equal, round, and reactive to light.  Neck: Normal range of motion. Neck supple. No thyromegaly present.  Cardiovascular: Normal rate, regular rhythm, normal heart sounds and intact distal pulses.   Pulmonary/Chest: Effort normal and breath sounds normal.  Abdominal: Soft. Bowel sounds are normal. She exhibits no mass. There is  no tenderness.  Musculoskeletal: Normal range of motion.  Lymphadenopathy:    She has no cervical adenopathy.  Neurological: She is alert and oriented to person, place, and time.  Skin: Skin is warm and dry. No rash noted.  Psychiatric: She has a normal mood and affect. Her behavior is normal.          Assessment & Plan:   Essential hypertension, well-controlled History of hypothyroidism.  We'll check a TSH Stage IV right breast cancer.  Follow-up oncology  Recheck here 6 months or as needed

## 2014-09-09 ENCOUNTER — Telehealth: Payer: Self-pay | Admitting: *Deleted

## 2014-09-09 NOTE — Telephone Encounter (Signed)
DOES PT. NEED BOTH LAB APPOINTMENTS? SPOKE WITH DR.MOHAMED'S NURSE, [place] [facility]. NEEDS TO KEEP BOTH LAB APPOINTMENTS. NOTIFIED PT. SHE VOICES UNDERSTANDING.

## 2014-09-14 ENCOUNTER — Other Ambulatory Visit: Payer: Self-pay | Admitting: Internal Medicine

## 2014-09-14 DIAGNOSIS — C50919 Malignant neoplasm of unspecified site of unspecified female breast: Secondary | ICD-10-CM

## 2014-09-23 ENCOUNTER — Telehealth: Payer: Self-pay | Admitting: Internal Medicine

## 2014-09-23 ENCOUNTER — Other Ambulatory Visit: Payer: Self-pay | Admitting: *Deleted

## 2014-09-23 DIAGNOSIS — C50911 Malignant neoplasm of unspecified site of right female breast: Secondary | ICD-10-CM

## 2014-09-23 NOTE — Telephone Encounter (Signed)
Spoke to pt's husband [name], told her to tell pt TSH was in normal range. Robert verbalized understanding.

## 2014-09-23 NOTE — Telephone Encounter (Signed)
Pt calling for lab results, please advise. 

## 2014-09-23 NOTE — Telephone Encounter (Signed)
Please call/notify patient that lab/test/procedure is normal 

## 2014-09-23 NOTE — Telephone Encounter (Signed)
Pt needs blood work results °

## 2014-09-24 ENCOUNTER — Other Ambulatory Visit (HOSPITAL_BASED_OUTPATIENT_CLINIC_OR_DEPARTMENT_OTHER): Payer: Medicare Other

## 2014-09-24 ENCOUNTER — Ambulatory Visit (HOSPITAL_BASED_OUTPATIENT_CLINIC_OR_DEPARTMENT_OTHER): Payer: Medicare Other

## 2014-09-24 VITALS — BP 122/68 | HR 72 | Temp 97.7°F

## 2014-09-24 DIAGNOSIS — C50911 Malignant neoplasm of unspecified site of right female breast: Secondary | ICD-10-CM

## 2014-09-24 DIAGNOSIS — C50919 Malignant neoplasm of unspecified site of unspecified female breast: Secondary | ICD-10-CM | POA: Diagnosis not present

## 2014-09-24 DIAGNOSIS — Z853 Personal history of malignant neoplasm of breast: Secondary | ICD-10-CM

## 2014-09-24 DIAGNOSIS — C7951 Secondary malignant neoplasm of bone: Secondary | ICD-10-CM

## 2014-09-24 DIAGNOSIS — C781 Secondary malignant neoplasm of mediastinum: Secondary | ICD-10-CM | POA: Diagnosis not present

## 2014-09-24 LAB — CBC WITH DIFFERENTIAL/PLATELET
BASO%: 0.4 % (ref 0.0–2.0)
BASOS ABS: 0 10*3/uL (ref 0.0–0.1)
EOS%: 8.5 % — ABNORMAL HIGH (ref 0.0–7.0)
Eosinophils Absolute: 0.4 10*3/uL (ref 0.0–0.5)
HCT: 40.7 % (ref 34.8–46.6)
HGB: 13.8 g/dL (ref 11.6–15.9)
LYMPH%: 33.4 % (ref 14.0–49.7)
MCH: 32.6 pg (ref 25.1–34.0)
MCHC: 33.9 g/dL (ref 31.5–36.0)
MCV: 96.2 fL (ref 79.5–101.0)
MONO#: 0.3 10*3/uL (ref 0.1–0.9)
MONO%: 5.4 % (ref 0.0–14.0)
NEUT%: 52.3 % (ref 38.4–76.8)
NEUTROS ABS: 2.5 10*3/uL (ref 1.5–6.5)
Platelets: 141 10*3/uL — ABNORMAL LOW (ref 145–400)
RBC: 4.23 10*6/uL (ref 3.70–5.45)
RDW: 12.7 % (ref 11.2–14.5)
WBC: 4.8 10*3/uL (ref 3.9–10.3)
lymph#: 1.6 10*3/uL (ref 0.9–3.3)

## 2014-09-24 LAB — COMPREHENSIVE METABOLIC PANEL (CC13)
ALBUMIN: 3.5 g/dL (ref 3.5–5.0)
ALK PHOS: 64 U/L (ref 40–150)
ALT: 16 U/L (ref 0–55)
ANION GAP: 11 meq/L (ref 3–11)
AST: 16 U/L (ref 5–34)
BILIRUBIN TOTAL: 0.45 mg/dL (ref 0.20–1.20)
BUN: 15.3 mg/dL (ref 7.0–26.0)
CO2: 25 mEq/L (ref 22–29)
Calcium: 8.8 mg/dL (ref 8.4–10.4)
Chloride: 106 mEq/L (ref 98–109)
Creatinine: 0.8 mg/dL (ref 0.6–1.1)
EGFR: 68 mL/min/{1.73_m2} — AB (ref >90)
Glucose: 148 mg/dl — ABNORMAL HIGH (ref 70–140)
Potassium: 4 mEq/L (ref 3.5–5.1)
Sodium: 143 mEq/L (ref 136–145)
Total Protein: 6.5 g/dL (ref 6.4–8.3)

## 2014-09-24 LAB — CANCER ANTIGEN 27.29: CA 27.29: 24 U/mL (ref 0–39)

## 2014-09-24 MED ORDER — DENOSUMAB 120 MG/1.7ML ~~LOC~~ SOLN
120.0000 mg | Freq: Once | SUBCUTANEOUS | Status: AC
  Administered 2014-09-24: 120 mg via SUBCUTANEOUS
  Filled 2014-09-24: qty 1.7

## 2014-09-28 ENCOUNTER — Telehealth: Payer: Self-pay | Admitting: *Deleted

## 2014-09-28 NOTE — Telephone Encounter (Signed)
Patient's spouse [name] called asking for "help with wife's diarrhea.  She has had numerous bowel movements in the past two hours since we ate lunch."  Spoke with [place] who reports "eating Spanish food that was spicy with avocado at 12:00 today.  Bowels have moved about eight times, soft brown.  Drinking water with no problems.  Last normal bowel movement was this morning.  Every morning I have a bm.  What can I take that won't interfere with the Xgeva injections and the?"  This nurse advised to drink clear liquids like Gatorade, propel, broths, jello etcetera to rest the bowel.  Or try Bananas, Rice, Apples and Toast.  Avoid whole wheat foods, sugary, hot, cold temperatures.  Will notify Dr. Julien Nordmann for any further orders.  Has not tried OTC Imodium.

## 2014-09-29 NOTE — Telephone Encounter (Signed)
Voicemail left requesting status update for diarrhea.  Awaiting return call.

## 2014-09-30 NOTE — Telephone Encounter (Signed)
Pt reports her diarrhea is gone "almost normal BM" .I recommended she get OTC imodium AD.

## 2014-10-04 ENCOUNTER — Other Ambulatory Visit: Payer: Self-pay | Admitting: Internal Medicine

## 2014-10-06 ENCOUNTER — Other Ambulatory Visit (HOSPITAL_BASED_OUTPATIENT_CLINIC_OR_DEPARTMENT_OTHER): Payer: Medicare Other

## 2014-10-06 DIAGNOSIS — C7951 Secondary malignant neoplasm of bone: Secondary | ICD-10-CM

## 2014-10-06 DIAGNOSIS — C50911 Malignant neoplasm of unspecified site of right female breast: Secondary | ICD-10-CM | POA: Diagnosis present

## 2014-10-06 DIAGNOSIS — C781 Secondary malignant neoplasm of mediastinum: Secondary | ICD-10-CM | POA: Diagnosis not present

## 2014-10-06 LAB — CBC WITH DIFFERENTIAL/PLATELET
BASO%: 1.1 % (ref 0.0–2.0)
BASOS ABS: 0.1 10*3/uL (ref 0.0–0.1)
EOS%: 8.4 % — ABNORMAL HIGH (ref 0.0–7.0)
Eosinophils Absolute: 0.5 10*3/uL (ref 0.0–0.5)
HEMATOCRIT: 41.2 % (ref 34.8–46.6)
HGB: 14 g/dL (ref 11.6–15.9)
LYMPH%: 32.3 % (ref 14.0–49.7)
MCH: 32.5 pg (ref 25.1–34.0)
MCHC: 34 g/dL (ref 31.5–36.0)
MCV: 95.6 fL (ref 79.5–101.0)
MONO#: 0.3 10*3/uL (ref 0.1–0.9)
MONO%: 5.7 % (ref 0.0–14.0)
NEUT%: 52.5 % (ref 38.4–76.8)
NEUTROS ABS: 2.9 10*3/uL (ref 1.5–6.5)
PLATELETS: 151 10*3/uL (ref 145–400)
RBC: 4.31 10*6/uL (ref 3.70–5.45)
RDW: 12.9 % (ref 11.2–14.5)
WBC: 5.5 10*3/uL (ref 3.9–10.3)
lymph#: 1.8 10*3/uL (ref 0.9–3.3)

## 2014-10-06 LAB — COMPREHENSIVE METABOLIC PANEL (CC13)
ALT: 13 U/L (ref 0–55)
AST: 18 U/L (ref 5–34)
Albumin: 3.5 g/dL (ref 3.5–5.0)
Alkaline Phosphatase: 55 U/L (ref 40–150)
Anion Gap: 8 mEq/L (ref 3–11)
BUN: 14.9 mg/dL (ref 7.0–26.0)
CALCIUM: 8.6 mg/dL (ref 8.4–10.4)
CHLORIDE: 109 meq/L (ref 98–109)
CO2: 24 mEq/L (ref 22–29)
Creatinine: 0.8 mg/dL (ref 0.6–1.1)
EGFR: 71 mL/min/{1.73_m2} — AB (ref >90)
GLUCOSE: 149 mg/dL — AB (ref 70–140)
Potassium: 3.8 mEq/L (ref 3.5–5.1)
Sodium: 140 mEq/L (ref 136–145)
TOTAL PROTEIN: 6.4 g/dL (ref 6.4–8.3)
Total Bilirubin: 0.44 mg/dL (ref 0.20–1.20)

## 2014-10-12 ENCOUNTER — Ambulatory Visit (INDEPENDENT_AMBULATORY_CARE_PROVIDER_SITE_OTHER): Payer: Medicare Other | Admitting: Internal Medicine

## 2014-10-12 ENCOUNTER — Encounter: Payer: Self-pay | Admitting: Internal Medicine

## 2014-10-12 VITALS — BP 120/82 | HR 76 | Temp 98.1°F | Resp 20 | Ht 62.0 in | Wt 136.0 lb

## 2014-10-12 DIAGNOSIS — C50911 Malignant neoplasm of unspecified site of right female breast: Secondary | ICD-10-CM | POA: Diagnosis not present

## 2014-10-12 DIAGNOSIS — I10 Essential (primary) hypertension: Secondary | ICD-10-CM | POA: Diagnosis not present

## 2014-10-12 DIAGNOSIS — H9191 Unspecified hearing loss, right ear: Secondary | ICD-10-CM

## 2014-10-12 DIAGNOSIS — H918X1 Other specified hearing loss, right ear: Secondary | ICD-10-CM

## 2014-10-12 NOTE — Progress Notes (Signed)
Subjective:    Patient ID: Erica Young, female    DOB: 12/02/1940, 74 y.o.   MRN: [id number]  HPI  74 year old patient who has a history of stage IV breast cancer.  2 days ago, she noted tinnitus involving the right ear that was described as a high-pitched whining type sound.  When she awoke yesterday morning, the tinnitus had resolved , but again reoccurred yesterday afternoon.  It was of a different quality and described as a very loud "ocean type noise".  Today she has had no further tinnitus She is scheduled to see oncology for follow-up tomorrow She does have a history of allergic rhinitis   Past Medical History  Diagnosis Date  . HYPERLIPIDEMIA 03/03/2010  . CEREBROVASCULAR DISEASE 03/03/2010  . DIVERTICULOSIS, COLON 03/03/2010  . Low back pain   . Right leg pain   . History of cerebral artery stenosis     right middle  . Mastoiditis     noted on brain MRI  . Foramen ovale     positive bubble study  . Diverticulitis   . Fatty liver 08/02/09    as per U/S done by [facility]  . Internal hemorrhoid   . Anxiety   . Hypertension   . Ulcer   . Stroke Oct. 2011    TIA  . HYPOTHYROIDISM 03/03/2010    no longer on meds  . Shortness of breath   . Headache(784.0)     she thinks sinus headaches  . Rib fracture   . Cancer 2007    right breat ca  , lumpectomy and radiation tx (declined chemo and additional prophylactic meds)    History   Social History  . Marital Status: Married    Spouse Name: N/A  . Number of Children: 2  . Years of Education: N/A   Occupational History  . Social Worker--Retired    Social History Main Topics  . Smoking status: Former Smoker -- 0.50 packs/day for 20 years    Types: Cigarettes    Quit date: 05/01/1991  . Smokeless tobacco: Never Used  . Alcohol Use: Yes     Comment: socially  . Drug Use: No  . Sexual Activity: Yes   Other Topics Concern  . Not on file   Social History Narrative   Daily caffeine     Past Surgical  History  Procedure Laterality Date  . Cholecystectomy    . Breast lumpectomy      right  . Tonsillectomy    . Colonoscopy    . Tubal ligation    . Shoulder surgery      right shoulder (dislocation)  . Video bronchoscopy with endobronchial ultrasound N/A 01/07/2014    Procedure: VIDEO BRONCHOSCOPY WITH ENDOBRONCHIAL ULTRASOUND;  Surgeon: Ivin Poot, MD;  Location: [facility] OR;  Service: Thoracic;  Laterality: N/A;    Family History  Problem Relation Age of Onset  . Heart disease Sister     cerebral vascular disease also  . Heart disease Brother     cerebral vascular disease also  . Heart disease Brother     cerebral vascular disease  . Heart disease Sister     cebreal vascular disease also  . Heart disease Sister     cebreal vascular diease also  . Heart disease Sister     cerebral vascular diease also  . Colon cancer Neg Hx   . Esophageal cancer Neg Hx   . Rectal cancer Neg Hx   . Stomach cancer Father  Allergies  Allergen Reactions  . Ciprofloxacin Anaphylaxis    Current Outpatient Prescriptions on File Prior to Visit  Medication Sig Dispense Refill  . ALPRAZolam (XANAX) 0.25 MG tablet TAKE 1 TABLET BY MOUTH THREE TIMES DAILY AS NEEDED FOR ANXIETY 90 tablet 2  . anastrozole (ARIMIDEX) 1 MG tablet TAKE 1 TABLET BY MOUTH DAILY 30 tablet 1  . aspirin EC 81 MG tablet Take 81 mg by mouth daily.    . Calcium-Vitamin D (CALTRATE 600 PLUS-VIT D PO) Take by mouth.    . cetirizine (ZYRTEC) 10 MG tablet Take 10 mg by mouth every other day.     . Cyanocobalamin (VITAMIN B 12 PO) Take 100 mcg by mouth daily.    Marland Kitchen denosumab (XGEVA) 120 MG/1.7ML SOLN injection Inject 120 mg into the skin every 30 (thirty) days.    Marland Kitchen diltiazem (CARDIZEM CD) 120 MG 24 hr capsule Take 1 capsule (120 mg total) by mouth daily. 90 capsule 4  . Melatonin 10 MG TABS Take 1 tablet by mouth daily.    . Multiple Vitamin (MULTIVITAMIN WITH MINERALS) TABS tablet Take 1 tablet by mouth daily.    . Probiotic  Product (PROBIOTIC DAILY PO) Take 1 capsule by mouth once. Takes 1 a day    . simvastatin (ZOCOR) 5 MG tablet Take 1 tablet (5 mg total) by mouth daily. 90 tablet 2   No current facility-administered medications on file prior to visit.    BP 136/90 mmHg  Pulse 76  Temp(Src) 98.1 F (36.7 C) (Oral)  Resp 20  Ht '5\' 2"'$  (1.575 m)  Wt 136 lb (61.689 kg)  BMI 24.87 kg/m2  SpO2 97%    Review of Systems  Constitutional: Negative.   HENT: Positive for hearing loss and tinnitus. Negative for congestion, dental problem, rhinorrhea, sinus pressure and sore throat.   Eyes: Negative for pain, discharge and visual disturbance.  Respiratory: Negative for cough and shortness of breath.   Cardiovascular: Negative for chest pain, palpitations and leg swelling.  Gastrointestinal: Negative for nausea, vomiting, abdominal pain, diarrhea, constipation, blood in stool and abdominal distention.  Genitourinary: Negative for dysuria, urgency, frequency, hematuria, flank pain, vaginal bleeding, vaginal discharge, difficulty urinating, vaginal pain and pelvic pain.  Musculoskeletal: Negative for joint swelling, arthralgias and gait problem.  Skin: Negative for rash.  Neurological: Negative for dizziness, syncope, speech difficulty, weakness, numbness and headaches.  Hematological: Negative for adenopathy.  Psychiatric/Behavioral: Negative for behavioral problems, dysphoric mood and agitation. The patient is not nervous/anxious.        Objective:   Physical Exam  Constitutional: She appears well-developed and well-nourished. No distress.  Repeat blood pressure 120/82  HENT:  Minimal wax in both canals Tympanic membranes appeared normal Weber lateralized to the left A conduction greater than bone conduction  Decreased hearing right ear          Assessment & Plan:   Right-sided tinnitus with hearing loss.  Will set up for ENT evaluation Stage IV breast cancer.  Follow-up oncology  tomorrow Hypertension, stable

## 2014-10-12 NOTE — Patient Instructions (Signed)
ENT evaluation as discussed  Follow-up Dr. Earlie Server as scheduled

## 2014-10-13 ENCOUNTER — Encounter: Payer: Self-pay | Admitting: Internal Medicine

## 2014-10-13 ENCOUNTER — Ambulatory Visit (HOSPITAL_BASED_OUTPATIENT_CLINIC_OR_DEPARTMENT_OTHER): Payer: Medicare Other | Admitting: Internal Medicine

## 2014-10-13 ENCOUNTER — Telehealth: Payer: Self-pay | Admitting: Internal Medicine

## 2014-10-13 VITALS — BP 133/67 | HR 78 | Temp 98.0°F | Resp 17 | Ht 62.0 in | Wt 133.7 lb

## 2014-10-13 DIAGNOSIS — C50911 Malignant neoplasm of unspecified site of right female breast: Secondary | ICD-10-CM | POA: Diagnosis not present

## 2014-10-13 DIAGNOSIS — C781 Secondary malignant neoplasm of mediastinum: Secondary | ICD-10-CM

## 2014-10-13 DIAGNOSIS — C7951 Secondary malignant neoplasm of bone: Secondary | ICD-10-CM | POA: Diagnosis not present

## 2014-10-13 NOTE — Telephone Encounter (Signed)
Gave adn printed appt sched and avs fo rpt for June thru [facility]

## 2014-10-13 NOTE — Progress Notes (Signed)
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 The patient has a history of right breast carcinoma status post lumpectomy followed by radiotherapy in 2007 diagnosed in [place] and the patient declined any further treatment at that time.   PRIOR THERAPY: None  CURRENT THERAPY:  1) Arimidex 1 mg by mouth daily. Started 01/22/2014. 2) Xgeva 120 g subcutaneously monthly.  INTERVAL HISTORY: Erica Young 74 y.o. female returns to the clinic today for followup visit accompanied by her husband. The patient has no complaints today. She denied having any significant weight loss or night sweats. She has no nausea or vomiting. The patient denied having any significant fever or chills. She denied having any significant adverse effects from the treatment with Arimidex. She was started on treatment with Xgeva for the metastatic bone disease. She had repeat CBC, comprehensive metabolic panel and CA 60.45 performed recently and she is here for evaluation and discussion of her lab results.  MEDICAL HISTORY: Past Medical History  Diagnosis Date  . HYPERLIPIDEMIA 03/03/2010  . CEREBROVASCULAR DISEASE 03/03/2010  . DIVERTICULOSIS, COLON 03/03/2010  . Low back pain   . Right leg pain   . History of cerebral artery stenosis     right middle  . Mastoiditis     noted on brain MRI  . Foramen ovale     positive bubble study  . Diverticulitis   . Fatty liver 08/02/09    as per U/S done by [facility]  . Internal hemorrhoid   . Anxiety   . Hypertension   . Ulcer   . Stroke Oct. 2011    TIA  . HYPOTHYROIDISM 03/03/2010    no longer on meds  . Shortness of breath   . Headache(784.0)     she thinks sinus headaches  . Rib fracture   . Cancer 2007    right  breat ca  , lumpectomy and radiation tx (declined chemo and additional prophylactic meds)    ALLERGIES:  is allergic to ciprofloxacin.  MEDICATIONS:  Current Outpatient Prescriptions  Medication Sig Dispense Refill  . ALPRAZolam (XANAX) 0.25 MG tablet TAKE 1 TABLET BY MOUTH THREE TIMES DAILY AS NEEDED FOR ANXIETY 90 tablet 2  . anastrozole (ARIMIDEX) 1 MG tablet TAKE 1 TABLET BY MOUTH DAILY 30 tablet 1  . aspirin EC 81 MG tablet Take 81 mg by mouth daily.    . Calcium-Vitamin D (CALTRATE 600 PLUS-VIT D PO) Take by mouth.    . cetirizine (ZYRTEC) 10 MG tablet Take 10 mg by mouth every other day.     . Cyanocobalamin (VITAMIN B 12 PO) Take 100 mcg by mouth daily.    Marland Kitchen denosumab (XGEVA) 120 MG/1.7ML SOLN injection Inject 120 mg into the skin every 30 (thirty) days.    Marland Kitchen diltiazem (CARDIZEM CD) 120 MG 24 hr capsule Take 1 capsule (120 mg total) by mouth daily. 90 capsule 4  . Melatonin 10 MG TABS Take 1 tablet by mouth daily.    . Multiple Vitamin (MULTIVITAMIN WITH MINERALS) TABS tablet Take 1 tablet by mouth daily.    . Probiotic Product (PROBIOTIC DAILY PO) Take 1 capsule by mouth once. Takes 1 a day    . simvastatin (ZOCOR) 5 MG tablet Take 1 tablet (5 mg total) by mouth daily. 90 tablet 2  No current facility-administered medications for this visit.    SURGICAL HISTORY:  Past Surgical History  Procedure Laterality Date  . Cholecystectomy    . Breast lumpectomy      right  . Tonsillectomy    . Colonoscopy    . Tubal ligation    . Shoulder surgery      right shoulder (dislocation)  . Video bronchoscopy with endobronchial ultrasound N/A 01/07/2014    Procedure: VIDEO BRONCHOSCOPY WITH ENDOBRONCHIAL ULTRASOUND;  Surgeon: Ivin Poot, MD;  Location: [facility] OR;  Service: Thoracic;  Laterality: N/A;    REVIEW OF SYSTEMS:  Constitutional: negative Eyes: negative Ears, nose, mouth, throat, and face: negative Respiratory: negative Cardiovascular: negative Gastrointestinal:  negative Genitourinary:negative Integument/breast: negative Hematologic/lymphatic: negative Musculoskeletal:negative Neurological: negative Behavioral/Psych: negative Endocrine: negative Allergic/Immunologic: negative   PHYSICAL EXAMINATION: General appearance: alert, cooperative and no distress Head: Normocephalic, without obvious abnormality, atraumatic Neck: no adenopathy, no JVD, supple, symmetrical, trachea midline and thyroid not enlarged, symmetric, no tenderness/mass/nodules Lymph nodes: Cervical, supraclavicular, and axillary nodes normal. Resp: clear to auscultation bilaterally Back: symmetric, no curvature. ROM normal. No CVA tenderness. Cardio: regular rate and rhythm, S1, S2 normal, no murmur, click, rub or gallop GI: soft, non-tender; bowel sounds normal; no masses,  no organomegaly Extremities: extremities normal, atraumatic, no cyanosis or edema Neurologic: Alert and oriented X 3, normal strength and tone. Normal symmetric reflexes. Normal coordination and gait  ECOG PERFORMANCE STATUS: 1 - Symptomatic but completely ambulatory  There were no vitals taken for this visit.  LABORATORY DATA: Lab Results  Component Value Date   WBC 5.5 10/06/2014   HGB 14.0 10/06/2014   HCT 41.2 10/06/2014   MCV 95.6 10/06/2014   PLT 151 10/06/2014      Chemistry      Component Value Date/Time   NA 140 10/06/2014 0809   NA 138 01/06/2014 0910   K 3.8 10/06/2014 0809   K 4.3 01/06/2014 0910   CL 104 01/06/2014 0910   CO2 24 10/06/2014 0809   CO2 22 01/06/2014 0910   BUN 14.9 10/06/2014 0809   BUN 9 01/06/2014 0910   CREATININE 0.8 10/06/2014 0809   CREATININE 0.56 01/06/2014 0910      Component Value Date/Time   CALCIUM 8.6 10/06/2014 0809   CALCIUM 9.0 01/06/2014 0910   ALKPHOS 55 10/06/2014 0809   ALKPHOS 87 01/06/2014 0910   AST 18 10/06/2014 0809   AST 17 01/06/2014 0910   ALT 13 10/06/2014 0809   ALT 16 01/06/2014 0910   BILITOT 0.44 10/06/2014 0809    BILITOT 0.3 01/06/2014 0910       RADIOGRAPHIC STUDIES: No results found. 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 Her disease is mainly in the bone as well as the mediastinum. The patient is currently asymptomatic and has no visceral crisis. She is currently on treatment with Arimidex 1 mg by mouth daily since 01/22/2014 and tolerating her treatment fairly well with no significant adverse effects. The patient noticed significant improvement in her symptoms and general condition after starting the treatment with the hormonal therapy. The patient continues to do very well with no significant adverse effect of her treatment. I discussed the lab result with the patient and her husband today. I recommended for her to continue with the same treatment regimen. She would come back for follow-up visit in 3 months for reevaluation with repeat CBC, comprehensive metabolic panel and  CA 27.29.  2) Metastatic bone disease: She was started on [place]. I also advised the patient to take a daily calcium and vitamin D supplements.  3) Followup visit: The patient would come back for followup visit in 3 months for evaluation and management any adverse effect of her treatment.  She was advised to call immediately if she has any concerning symptoms in the interval.  The patient voices understanding of current disease status and treatment options and is in agreement with the current care plan.  All questions were answered. The patient knows to call the clinic with any problems, questions or concerns. We can certainly see the patient much sooner if necessary.  Disclaimer: This note was dictated with voice recognition software. Similar sounding words can inadvertently be transcribed and may not be corrected upon review.

## 2014-10-18 DIAGNOSIS — H9123 Sudden idiopathic hearing loss, bilateral: Secondary | ICD-10-CM | POA: Diagnosis not present

## 2014-10-18 DIAGNOSIS — H9311 Tinnitus, right ear: Secondary | ICD-10-CM | POA: Diagnosis not present

## 2014-10-18 DIAGNOSIS — H903 Sensorineural hearing loss, bilateral: Secondary | ICD-10-CM | POA: Diagnosis not present

## 2014-10-18 DIAGNOSIS — J310 Chronic rhinitis: Secondary | ICD-10-CM | POA: Diagnosis not present

## 2014-10-18 DIAGNOSIS — H9201 Otalgia, right ear: Secondary | ICD-10-CM | POA: Diagnosis not present

## 2014-10-19 ENCOUNTER — Other Ambulatory Visit: Payer: Self-pay | Admitting: Otolaryngology

## 2014-10-19 DIAGNOSIS — H905 Unspecified sensorineural hearing loss: Secondary | ICD-10-CM

## 2014-10-20 ENCOUNTER — Telehealth: Payer: Self-pay | Admitting: *Deleted

## 2014-10-20 NOTE — Telephone Encounter (Signed)
Yes. She can skip this month.

## 2014-10-20 NOTE — Telephone Encounter (Signed)
Patient called requesting return call from [place].  Return number [phone].  "I'm having problems with diverticulitis and having to diet/eat very little for the past two days.  I feel weak and not feeling so good.  Can the Xgeva injection scheduled for tomorrow be skipped? "

## 2014-10-21 ENCOUNTER — Telehealth: Payer: Self-pay | Admitting: Internal Medicine

## 2014-10-21 NOTE — Telephone Encounter (Signed)
Called patient.  Patient requested to skip due to feeling weak, on bland diet for diverticulitis and stomach tender.  Dr. Julien Nordmann approved skipping this month's injection.

## 2014-10-21 NOTE — Telephone Encounter (Signed)
Return call from patient reporting she "is feeling better.  I do not want any repercussions from missing my injection so I want to reschedule."  Scheduler notified.  First available at this time is 1:30 pm.  Patient given new appointment time and this "time is fine".

## 2014-10-21 NOTE — Telephone Encounter (Signed)
Patient forwarded to me from triage. Patient initially cxd lab/inj appointments for 6/24 then called to have them put back on. Patient given new time for lab/inj 10/22/14 @ 1:30 pm.

## 2014-10-22 ENCOUNTER — Ambulatory Visit: Payer: 59

## 2014-10-22 ENCOUNTER — Ambulatory Visit (HOSPITAL_BASED_OUTPATIENT_CLINIC_OR_DEPARTMENT_OTHER): Payer: Medicare Other

## 2014-10-22 ENCOUNTER — Other Ambulatory Visit: Payer: 59

## 2014-10-22 ENCOUNTER — Other Ambulatory Visit (HOSPITAL_BASED_OUTPATIENT_CLINIC_OR_DEPARTMENT_OTHER): Payer: Medicare Other

## 2014-10-22 VITALS — BP 124/96 | HR 72 | Temp 98.1°F

## 2014-10-22 DIAGNOSIS — C50911 Malignant neoplasm of unspecified site of right female breast: Secondary | ICD-10-CM

## 2014-10-22 DIAGNOSIS — C781 Secondary malignant neoplasm of mediastinum: Secondary | ICD-10-CM

## 2014-10-22 DIAGNOSIS — C7951 Secondary malignant neoplasm of bone: Secondary | ICD-10-CM | POA: Diagnosis not present

## 2014-10-22 DIAGNOSIS — Z853 Personal history of malignant neoplasm of breast: Secondary | ICD-10-CM

## 2014-10-22 LAB — COMPREHENSIVE METABOLIC PANEL (CC13)
ALT: 24 U/L (ref 0–55)
AST: 20 U/L (ref 5–34)
Albumin: 3.2 g/dL — ABNORMAL LOW (ref 3.5–5.0)
Alkaline Phosphatase: 64 U/L (ref 40–150)
Anion Gap: 7 mEq/L (ref 3–11)
BUN: 10.8 mg/dL (ref 7.0–26.0)
CALCIUM: 8.8 mg/dL (ref 8.4–10.4)
CHLORIDE: 106 meq/L (ref 98–109)
CO2: 25 meq/L (ref 22–29)
Creatinine: 0.7 mg/dL (ref 0.6–1.1)
EGFR: 85 mL/min/{1.73_m2} — ABNORMAL LOW (ref >90)
GLUCOSE: 104 mg/dL (ref 70–140)
POTASSIUM: 4 meq/L (ref 3.5–5.1)
SODIUM: 138 meq/L (ref 136–145)
Total Bilirubin: 0.29 mg/dL (ref 0.20–1.20)
Total Protein: 6.4 g/dL (ref 6.4–8.3)

## 2014-10-22 MED ORDER — DENOSUMAB 120 MG/1.7ML ~~LOC~~ SOLN
120.0000 mg | Freq: Once | SUBCUTANEOUS | Status: AC
  Administered 2014-10-22: 120 mg via SUBCUTANEOUS
  Filled 2014-10-22: qty 1.7

## 2014-10-29 ENCOUNTER — Other Ambulatory Visit: Payer: Self-pay | Admitting: Internal Medicine

## 2014-11-02 ENCOUNTER — Telehealth: Payer: Self-pay | Admitting: *Deleted

## 2014-11-02 ENCOUNTER — Other Ambulatory Visit: Payer: Medicare Other

## 2014-11-02 NOTE — Telephone Encounter (Signed)
TC from pt with concerns about having brain MRI and receiving Xgeva injections. Pt cancelled her MRI today because she was unsure if dye used for MRI would be ok with her every 28 day Xgeva. Advised pt that it was ok to do MRI while getting Xgeva and to reschedule her MRI. Pt voiced understanding and will reschedule.

## 2014-11-11 ENCOUNTER — Ambulatory Visit
Admission: RE | Admit: 2014-11-11 | Discharge: 2014-11-11 | Disposition: A | Discharge status: Home / Self Care | Payer: Medicare Other | Source: Ambulatory Visit | Attending: Otolaryngology | Admitting: Otolaryngology

## 2014-11-11 DIAGNOSIS — H9041 Sensorineural hearing loss, unilateral, right ear, with unrestricted hearing on the contralateral side: Secondary | ICD-10-CM | POA: Diagnosis not present

## 2014-11-11 DIAGNOSIS — Z853 Personal history of malignant neoplasm of breast: Secondary | ICD-10-CM | POA: Diagnosis not present

## 2014-11-11 DIAGNOSIS — H905 Unspecified sensorineural hearing loss: Secondary | ICD-10-CM

## 2014-11-11 MED ORDER — GADOBENATE DIMEGLUMINE 529 MG/ML IV SOLN
12.0000 mL | Freq: Once | INTRAVENOUS | Status: AC | PRN
  Administered 2014-11-11: 12 mL via INTRAVENOUS

## 2014-11-12 ENCOUNTER — Other Ambulatory Visit: Payer: Self-pay | Admitting: Internal Medicine

## 2014-11-12 ENCOUNTER — Telehealth: Payer: Self-pay | Admitting: *Deleted

## 2014-11-12 DIAGNOSIS — C50911 Malignant neoplasm of unspecified site of right female breast: Secondary | ICD-10-CM

## 2014-11-12 NOTE — Telephone Encounter (Signed)
Received faxed MRI report from Dr. Berle Mull office. Dr. Julien Nordmann out of the office today. Called Wes at Dr. Berle Mull office with instructions to contact MD on call.

## 2014-11-15 ENCOUNTER — Telehealth: Payer: Self-pay | Admitting: Medical Oncology

## 2014-11-15 NOTE — Telephone Encounter (Signed)
Per Dr Julien Nordmann I notified pt of of MRI results and that he referred her to radiation oncology. She has appt next week.

## 2014-11-17 DIAGNOSIS — H9123 Sudden idiopathic hearing loss, bilateral: Secondary | ICD-10-CM | POA: Diagnosis not present

## 2014-11-17 DIAGNOSIS — J310 Chronic rhinitis: Secondary | ICD-10-CM | POA: Diagnosis not present

## 2014-11-19 ENCOUNTER — Ambulatory Visit: Payer: Medicare Other

## 2014-11-19 ENCOUNTER — Ambulatory Visit: Payer: Medicare Other | Admitting: Radiation Oncology

## 2014-11-19 ENCOUNTER — Other Ambulatory Visit (HOSPITAL_BASED_OUTPATIENT_CLINIC_OR_DEPARTMENT_OTHER): Payer: Medicare Other

## 2014-11-19 ENCOUNTER — Encounter: Payer: Self-pay | Admitting: Radiation Oncology

## 2014-11-19 ENCOUNTER — Ambulatory Visit (HOSPITAL_BASED_OUTPATIENT_CLINIC_OR_DEPARTMENT_OTHER): Payer: Medicare Other

## 2014-11-19 VITALS — BP 142/69 | HR 68 | Temp 97.5°F

## 2014-11-19 DIAGNOSIS — Z853 Personal history of malignant neoplasm of breast: Secondary | ICD-10-CM | POA: Diagnosis not present

## 2014-11-19 DIAGNOSIS — C7951 Secondary malignant neoplasm of bone: Secondary | ICD-10-CM | POA: Diagnosis present

## 2014-11-19 DIAGNOSIS — C50911 Malignant neoplasm of unspecified site of right female breast: Secondary | ICD-10-CM | POA: Diagnosis present

## 2014-11-19 DIAGNOSIS — C781 Secondary malignant neoplasm of mediastinum: Secondary | ICD-10-CM

## 2014-11-19 LAB — COMPREHENSIVE METABOLIC PANEL (CC13)
ALT: 14 U/L (ref 0–55)
AST: 17 U/L (ref 5–34)
Albumin: 3.7 g/dL (ref 3.5–5.0)
Alkaline Phosphatase: 57 U/L (ref 40–150)
Anion Gap: 8 mEq/L (ref 3–11)
BUN: 14.5 mg/dL (ref 7.0–26.0)
CHLORIDE: 107 meq/L (ref 98–109)
CO2: 26 meq/L (ref 22–29)
Calcium: 8.9 mg/dL (ref 8.4–10.4)
Creatinine: 0.8 mg/dL (ref 0.6–1.1)
EGFR: 70 mL/min/{1.73_m2} — AB (ref >90)
GLUCOSE: 143 mg/dL — AB (ref 70–140)
Potassium: 4.1 mEq/L (ref 3.5–5.1)
Sodium: 141 mEq/L (ref 136–145)
Total Bilirubin: 0.53 mg/dL (ref 0.20–1.20)
Total Protein: 6.5 g/dL (ref 6.4–8.3)

## 2014-11-19 MED ORDER — DENOSUMAB 120 MG/1.7ML ~~LOC~~ SOLN
120.0000 mg | Freq: Once | SUBCUTANEOUS | Status: AC
  Administered 2014-11-19: 120 mg via SUBCUTANEOUS
  Filled 2014-11-19: qty 1.7

## 2014-11-19 NOTE — Progress Notes (Addendum)
Location/Histology of Brain Tumor:  Enhancing Mass Right  Posterior Parietal Bone  Consistent metastatic disease with epidural extension, , additional enhancing 9m  mass left cribriform plate mets with inttracranial extension  Patient presented with symptoms of:  Hearing problems right ear, vertigo,nausea, vomiting  Past or anticipated interventions, if any, per neurosurgery:  None  Past or anticipated interventions, if any, per medical oncology: Dr. MWillette Almad, 10/13/14, Armidex '1mg'$  po daily started 01/22/14, Xgeva 120ug sq monthly for metastatic bone disease,   Dose of Decadron, if applicable: No  Recent neurologic symptoms, if any: Hearing loss right ear   Seizures: NO  Headaches: yes, mastoiditis noted on brain MRI H/a back of head at times mild pain,  When she wakes up in the morning her head feels full,  Takes a while for to clear up   Nausea: no  Dizziness/ataxia:yes, in am   Difficulty with hand coordination: No  Focal numbness/weakness: TIA 01/2010  Visual deficits/changes: has cataracts, , not new   Confusion/Memory deficits:  Mild yes  Painful bone metastases at present, if any: head hurts in the morning feels dull ache,fullness   SAFETY ISSUES:Yes, dizzy ness at times  Prior radiation? Yes, 2007   Pacemaker/ICD? NO  Possible current pregnancy? NO  Is the patient on methotrexate? NO  Additional Complaints / other details:   Married,  2 children, HX Metastatic breast cancer positive estrogen receptor dx 12/2003,s/p lumpectiomy ollowed by radiotherapy 2007 dx [place],pt declined further tx at that time, hx right cerebral artery stenosis middle. Foramen ovale positive bubble study cerebral vascular disease 03/03/10 Anxiety, SOB,  Former smoker cigarettes 1/2ppd 20 years quit 1993,no smokeles tobacco, alcohol occasionally, no illicit drug usefather stomach cancer   Allergies: Ciprofloxacin=Anaphylaxis high .BP 132/54 mmHg  Pulse 71  Temp(Src) 97.6 F (36.4 C)  (Oral)  Resp 20  Ht '5\' 2"'$  (1.575 m)  Wt 135 lb 1.6 oz (61.281 kg)  BMI 24.70 kg/m2  SpO2 97%  Wt Readings from Last 3 Encounters:  11/22/14 135 lb 1.6 oz (61.281 kg)  11/11/14 133 lb (60.328 kg)  10/13/14 133 lb 11.2 oz (60.646 kg)

## 2014-11-22 ENCOUNTER — Ambulatory Visit
Admission: RE | Admit: 2014-11-22 | Discharge: 2014-11-22 | Disposition: A | Discharge status: Home / Self Care | Payer: Medicare Other | Source: Ambulatory Visit

## 2014-11-22 ENCOUNTER — Ambulatory Visit
Admission: RE | Admit: 2014-11-22 | Discharge: 2014-11-22 | Disposition: A | Discharge status: Home / Self Care | Payer: Medicare Other | Source: Ambulatory Visit | Attending: Radiation Oncology | Admitting: Radiation Oncology

## 2014-11-22 ENCOUNTER — Encounter: Payer: Self-pay | Admitting: Radiation Oncology

## 2014-11-22 VITALS — BP 132/54 | HR 71 | Temp 97.6°F | Resp 20 | Ht 62.0 in | Wt 135.1 lb

## 2014-11-22 DIAGNOSIS — C7952 Secondary malignant neoplasm of bone marrow: Principal | ICD-10-CM

## 2014-11-22 DIAGNOSIS — Z8673 Personal history of transient ischemic attack (TIA), and cerebral infarction without residual deficits: Secondary | ICD-10-CM | POA: Insufficient documentation

## 2014-11-22 DIAGNOSIS — Z510 Encounter for antineoplastic radiation therapy: Secondary | ICD-10-CM | POA: Diagnosis not present

## 2014-11-22 DIAGNOSIS — I10 Essential (primary) hypertension: Secondary | ICD-10-CM | POA: Insufficient documentation

## 2014-11-22 DIAGNOSIS — Z7982 Long term (current) use of aspirin: Secondary | ICD-10-CM | POA: Diagnosis not present

## 2014-11-22 DIAGNOSIS — E039 Hypothyroidism, unspecified: Secondary | ICD-10-CM | POA: Insufficient documentation

## 2014-11-22 DIAGNOSIS — F419 Anxiety disorder, unspecified: Secondary | ICD-10-CM | POA: Insufficient documentation

## 2014-11-22 DIAGNOSIS — Z853 Personal history of malignant neoplasm of breast: Secondary | ICD-10-CM | POA: Diagnosis not present

## 2014-11-22 DIAGNOSIS — C7951 Secondary malignant neoplasm of bone: Secondary | ICD-10-CM

## 2014-11-22 DIAGNOSIS — Z79899 Other long term (current) drug therapy: Secondary | ICD-10-CM | POA: Insufficient documentation

## 2014-11-22 DIAGNOSIS — Z87891 Personal history of nicotine dependence: Secondary | ICD-10-CM | POA: Insufficient documentation

## 2014-11-22 DIAGNOSIS — E785 Hyperlipidemia, unspecified: Secondary | ICD-10-CM | POA: Insufficient documentation

## 2014-11-22 HISTORY — DX: Malignant neoplasm of bone and articular cartilage, unspecified: C41.9

## 2014-11-22 HISTORY — DX: Allergy, unspecified, initial encounter: T78.40XA

## 2014-11-22 NOTE — Progress Notes (Signed)
Please see the Nurse Progress Note in the MD Initial Consult Encounter for this patient. 

## 2014-11-22 NOTE — Progress Notes (Signed)
Radiation Oncology         [phone]) [phone] ________________________________  Name: Erica Young MRN: [id number]  Date: 11/22/2014  DOB: Jan 10, 1941  EH:[id number],MOQHU Pilar Plate, MD  Curt Bears, MD     REFERRING PHYSICIAN: Curt Bears, MD   DIAGNOSIS: The encounter diagnosis was Bone metastases.   HISTORY OF PRESENT ILLNESS::Erica Young is a 74 y.o. female who is seen for an initial consultation visit regarding the patient's diagnosis of bone metastases. The patient has a history of stage IV right breast carcinoma status post lumpectomy followed by radiotherapy in 2007 diagnosed in [place] and the patient declined any further treatment at that time. She is currently undergoing chemotherapy ( Armidex '1mg'$  po daily started 01/22/14, Xgeva 120ug sq monthly for metastatic bone disease) under Dr. Julien Nordmann.  Patient presented with symptoms of tinnitus in the right ear, vertigo, nausea and vomiting. An MRI scan of the brain was ordered on based symptoms of right ear hearing loss and ringing. The imaging revealed an enhancing 9 x 31 mm mass in the right posterior parietal bone with consistent metastatic disease with epidural extension. An additional 28 mm enhancing mass in the left cribriform plate region with intracranial extension was also found; this appears to be a bone metastasis from breast cancer rather than olfactory neuroblastoma. Negative for vestibular schwannoma, imaging could not conclusively identify source of hearing loss.  PREVIOUS RADIATION THERAPY: Yes, 2007 in [place] for breast cancer   PAST MEDICAL HISTORY:  has a past medical history of HYPERLIPIDEMIA (03/03/2010); CEREBROVASCULAR DISEASE (03/03/2010); DIVERTICULOSIS, COLON (03/03/2010); Low back pain; Right leg pain; History of cerebral artery stenosis; Mastoiditis; Foramen ovale; Diverticulitis; Fatty liver (08/02/09); Internal hemorrhoid; Anxiety; Hypertension; Ulcer; Stroke (Oct. 2011); HYPOTHYROIDISM (03/03/2010); Shortness  of breath; Headache(784.0); Rib fracture; Cancer (2007); Bone cancer (mri 11/11/14); and Allergy.     PAST SURGICAL HISTORY: Past Surgical History  Procedure Laterality Date  . Cholecystectomy    . Breast lumpectomy      right  . Tonsillectomy    . Colonoscopy    . Tubal ligation    . Shoulder surgery      right shoulder (dislocation)  . Video bronchoscopy with endobronchial ultrasound N/A 01/07/2014    Procedure: VIDEO BRONCHOSCOPY WITH ENDOBRONCHIAL ULTRASOUND;  Surgeon: Ivin Poot, MD;  Location: [facility] OR;  Service: Thoracic;  Laterality: N/A;     FAMILY HISTORY: family history includes Heart disease in her brother, brother, sister, sister, sister, and sister; Stomach cancer in her father. There is no history of Colon cancer, Esophageal cancer, or Rectal cancer.   SOCIAL HISTORY:  reports that she quit smoking about 23 years ago. Her smoking use included Cigarettes. She has a 10 pack-year smoking history. She has never used smokeless tobacco. She reports that she drinks alcohol. She reports that she does not use illicit drugs.   ALLERGIES: Ciprofloxacin   MEDICATIONS:  Current Outpatient Prescriptions  Medication Sig Dispense Refill  . ALPRAZolam (XANAX) 0.25 MG tablet TAKE 1 TABLET BY MOUTH THREE TIMES DAILY AS NEEDED FOR ANXIETY 90 tablet 2  . anastrozole (ARIMIDEX) 1 MG tablet TAKE 1 TABLET BY MOUTH EVERY DAY 30 tablet 0  . aspirin EC 81 MG tablet Take 81 mg by mouth daily.    . Calcium-Vitamin D (CALTRATE 600 PLUS-VIT D PO) Take 1 tablet by mouth 2 (two) times daily.     . cetirizine (ZYRTEC) 10 MG tablet Take 10 mg by mouth every other day.     . Cyanocobalamin (VITAMIN B 12  PO) Take 100 mcg by mouth daily.    Marland Kitchen denosumab (XGEVA) 120 MG/1.7ML SOLN injection Inject 120 mg into the skin every 30 (thirty) days.    Marland Kitchen diltiazem (CARDIZEM CD) 120 MG 24 hr capsule Take 1 capsule (120 mg total) by mouth daily. 90 capsule 4  . Melatonin 10 MG TABS Take 1 tablet by mouth daily.     . Multiple Vitamin (MULTIVITAMIN WITH MINERALS) TABS tablet Take 1 tablet by mouth daily.    . Probiotic Product (PROBIOTIC DAILY PO) Take 1 capsule by mouth once. Takes 1 a day    . simvastatin (ZOCOR) 5 MG tablet Take 1 tablet (5 mg total) by mouth daily. 90 tablet 2   No current facility-administered medications for this encounter.     REVIEW OF SYSTEMS:  A 15 point review of systems is documented in the electronic medical record. This was obtained by the nursing staff. However, I reviewed this with the patient to discuss relevant findings and make appropriate changes.  Pertinent items are noted in HPI.    PHYSICAL EXAM:  height is '5\' 2"'$  (1.575 m) and weight is 135 lb 1.6 oz (61.281 kg). Her oral temperature is 97.6 F (36.4 C). Her blood pressure is 132/54 and her pulse is 71. Her respiration is 20 and oxygen saturation is 97%.   General: Well-developed, in no acute distress HEENT: Normocephalic, atraumatic Cardiovascular: Regular rate and rhythm Respiratory: Clear to auscultation bilaterally Extremities: No edema present  Patient has an area of tenderness located in right posterior area of head consistent with area of disease noted in MRI. No clear tumor felt upon palpation.   ECOG = 1  0 - Asymptomatic (Fully active, able to carry on all predisease activities without restriction)  1 - Symptomatic but completely ambulatory (Restricted in physically strenuous activity but ambulatory and able to carry out work of a light or sedentary nature. For example, light housework, office work)  2 - Symptomatic, <50% in bed during the day (Ambulatory and capable of all self care but unable to carry out any work activities. Up and about more than 50% of waking hours)  3 - Symptomatic, >50% in bed, but not bedbound (Capable of only limited self-care, confined to bed or chair 50% or more of waking hours)  4 - Bedbound (Completely disabled. Cannot carry on any self-care. Totally confined to bed  or chair)  5 - Death   Eustace Pen MM, Creech RH, Tormey DC, et al. [phone]). "Toxicity and response criteria of the [facility]". [place] Oncol. 5 (6): 649-55  _   LABORATORY DATA:  Lab Results  Component Value Date   WBC 5.5 10/06/2014   HGB 14.0 10/06/2014   HCT 41.2 10/06/2014   MCV 95.6 10/06/2014   PLT 151 10/06/2014   Lab Results  Component Value Date   NA 141 11/19/2014   K 4.1 11/19/2014   CL 104 01/06/2014   CO2 26 11/19/2014   Lab Results  Component Value Date   ALT 14 11/19/2014   AST 17 11/19/2014   ALKPHOS 57 11/19/2014   BILITOT 0.53 11/19/2014      RADIOGRAPHY: Mr Jeri Cos Wo Contrast  11/11/2014   CLINICAL DATA:  Sensorineural hearing loss right ear. History breast cancer  EXAM: MRI HEAD WITHOUT AND WITH CONTRAST  TECHNIQUE: Multiplanar, multiecho pulse sequences of the brain and surrounding structures were obtained without and with intravenous contrast.  CONTRAST:  83m MULTIHANCE GADOBENATE DIMEGLUMINE 529 MG/ML IV  SOLN  COMPARISON:  MRI brain without contrast 02/17/2010  FINDINGS: IAC protocol was performed including thin section imaging through the posterior fossa before and after intravenous contrast.  Seventh and eighth cranial nerves are normal. No mass lesion. Negative for vestibular schwannoma. Brainstem and cerebellum are normal. Mild mucosal edema right mastoid tip. Left mastoid sinus clear. No enhancing mass in the posterior fossa.  Enhancing mass involving the right posterior parietal bone which indents the dura and enlarges the bone. The mass measures 9 x 31 mm and is consistent with metastatic disease.  Additional enhancing mass in the left cribriform plate region shows homogeneous enhancement and measures 28 x 28 x 28 mm. There is intracranial extension however the majority of the mass is in the ethmoid sinus bilaterally, left greater than right. No extension into the orbit. There is obstruction of the left frontal sinus which is  opacified with fluid. Given the right parietal lesion, this most likely is a bone metastasis from breast cancer rather than olfactory neuroblastoma.  No enhancing metastatic deposits are present within the brain. No other skull lesion identified.  Ventricle size normal.  Pituitary normal in size.  Negative for acute or chronic infarct.  IMPRESSION: Negative for vestibular schwannoma. No cause for hearing loss identified. Small amount of mucosal edema right mastoid tip.  Enhancing bone lesion right posterior parietal bone consistent with metastatic disease with epidural extension.  28 mm enhancing mass left cribriform plate compatible with metastatic disease with intracranial extension. Olfactory neuroblastoma less likely given the right parietal bone lesion. These bony lesions were not present on the MRI of 02/17/2010.  These results were called by telephone at the time of interpretation on 11/11/2014 at 3:12 pm to Dr. Ernesto Rutherford, who verbally acknowledged these results.   Electronically Signed   By: Franchot Gallo M.D.   On: 11/11/2014 15:14       IMPRESSION: The patient is 74 year old female with metastatic breast cancer with two lesions of bone cancer in the skull. The patient would be a good candidate for palliative radiation treatment in the management of her disease.   PLAN: We discussed the possible side effects and risks of treatment in addition to the possible benefits of treatment. We discussed the protocol for radiation treatment.  All of the patient's questions were answered. The patient does wish to proceed with this treatment. I anticipate focal treatment of 30 Gy over 2 weeks to the two areas of concern.  A simulation will be scheduled such that we can proceed with treatment planning.     ________________________________   Jodelle Gross, MD, PhD   **Disclaimer: This note was dictated with voice recognition software. Similar sounding words can inadvertently be transcribed and this note  may contain transcription errors which may not have been corrected upon publication of note.**

## 2014-11-26 ENCOUNTER — Ambulatory Visit
Admission: RE | Admit: 2014-11-26 | Discharge: 2014-11-26 | Disposition: A | Discharge status: Home / Self Care | Payer: Medicare Other | Source: Ambulatory Visit | Attending: Radiation Oncology | Admitting: Radiation Oncology

## 2014-11-26 DIAGNOSIS — Z853 Personal history of malignant neoplasm of breast: Secondary | ICD-10-CM | POA: Diagnosis not present

## 2014-11-26 DIAGNOSIS — C7951 Secondary malignant neoplasm of bone: Secondary | ICD-10-CM

## 2014-11-26 DIAGNOSIS — Z510 Encounter for antineoplastic radiation therapy: Secondary | ICD-10-CM | POA: Diagnosis not present

## 2014-11-26 DIAGNOSIS — E039 Hypothyroidism, unspecified: Secondary | ICD-10-CM | POA: Diagnosis not present

## 2014-11-26 DIAGNOSIS — F419 Anxiety disorder, unspecified: Secondary | ICD-10-CM | POA: Diagnosis not present

## 2014-11-26 DIAGNOSIS — E785 Hyperlipidemia, unspecified: Secondary | ICD-10-CM | POA: Diagnosis not present

## 2014-11-30 DIAGNOSIS — E785 Hyperlipidemia, unspecified: Secondary | ICD-10-CM | POA: Diagnosis not present

## 2014-11-30 DIAGNOSIS — F419 Anxiety disorder, unspecified: Secondary | ICD-10-CM | POA: Diagnosis not present

## 2014-11-30 DIAGNOSIS — Z853 Personal history of malignant neoplasm of breast: Secondary | ICD-10-CM | POA: Diagnosis not present

## 2014-11-30 DIAGNOSIS — E039 Hypothyroidism, unspecified: Secondary | ICD-10-CM | POA: Diagnosis not present

## 2014-11-30 DIAGNOSIS — Z510 Encounter for antineoplastic radiation therapy: Secondary | ICD-10-CM | POA: Diagnosis not present

## 2014-11-30 DIAGNOSIS — C7951 Secondary malignant neoplasm of bone: Secondary | ICD-10-CM | POA: Diagnosis not present

## 2014-12-02 DIAGNOSIS — Z853 Personal history of malignant neoplasm of breast: Secondary | ICD-10-CM | POA: Diagnosis not present

## 2014-12-02 DIAGNOSIS — F419 Anxiety disorder, unspecified: Secondary | ICD-10-CM | POA: Diagnosis not present

## 2014-12-02 DIAGNOSIS — E039 Hypothyroidism, unspecified: Secondary | ICD-10-CM | POA: Diagnosis not present

## 2014-12-02 DIAGNOSIS — E785 Hyperlipidemia, unspecified: Secondary | ICD-10-CM | POA: Diagnosis not present

## 2014-12-02 DIAGNOSIS — C7951 Secondary malignant neoplasm of bone: Secondary | ICD-10-CM | POA: Diagnosis not present

## 2014-12-02 DIAGNOSIS — Z510 Encounter for antineoplastic radiation therapy: Secondary | ICD-10-CM | POA: Diagnosis not present

## 2014-12-03 ENCOUNTER — Telehealth: Payer: Self-pay | Admitting: *Deleted

## 2014-12-03 NOTE — Telephone Encounter (Signed)
Thanks ros, will call patient before noon, about her tx on Monday

## 2014-12-03 NOTE — Telephone Encounter (Signed)
[facility]ed [facility],spoke with Phil  Patient is ready to go back to [employer], "They will be by shortly to get him" then called [facility] [place], spoke with Shirlean Mylar, Agricultural consultant, patient only had Ct simulation today will start treatment next week, and I called [facility]  To come get the patient to bring him back to the floor on [facility], 11:17 AM  ,

## 2014-12-03 NOTE — Telephone Encounter (Signed)
Patient called asking to speak with Thayer Headings RN with Dr. Lisbeth Renshaw.  Call transferred.  Enid Derry will put call through to [employer] as she is away from desk at this time.

## 2014-12-03 NOTE — Telephone Encounter (Signed)
Called patient, asking about the tattoo that was to be done before  I start rad tx on Monday, transferred call to Lian[facility]#4, patient to speak with [name], RT therapist 25:42 AM

## 2014-12-06 ENCOUNTER — Ambulatory Visit
Admission: RE | Admit: 2014-12-06 | Discharge: 2014-12-06 | Disposition: A | Discharge status: Home / Self Care | Payer: Medicare Other | Source: Ambulatory Visit | Attending: Radiation Oncology | Admitting: Radiation Oncology

## 2014-12-06 DIAGNOSIS — F419 Anxiety disorder, unspecified: Secondary | ICD-10-CM | POA: Diagnosis not present

## 2014-12-06 DIAGNOSIS — E039 Hypothyroidism, unspecified: Secondary | ICD-10-CM | POA: Diagnosis not present

## 2014-12-06 DIAGNOSIS — C7951 Secondary malignant neoplasm of bone: Secondary | ICD-10-CM | POA: Diagnosis not present

## 2014-12-06 DIAGNOSIS — E785 Hyperlipidemia, unspecified: Secondary | ICD-10-CM | POA: Diagnosis not present

## 2014-12-06 DIAGNOSIS — Z510 Encounter for antineoplastic radiation therapy: Secondary | ICD-10-CM | POA: Diagnosis not present

## 2014-12-06 DIAGNOSIS — Z853 Personal history of malignant neoplasm of breast: Secondary | ICD-10-CM | POA: Diagnosis not present

## 2014-12-06 MED ORDER — BIAFINE EX EMUL
Freq: Every day | CUTANEOUS | Status: DC
  Administered 2014-12-06: 14:00:00 via TOPICAL

## 2014-12-06 NOTE — Progress Notes (Signed)
Managing Acute Radiation Side Effects for Head and Neck Cancer  Skin irritation:  . Biafine  Topical Emulsion: First-line topical cream to help soothe skin irritation.  Apply to skin in radiation fields at least 4 hours before radiotherapy, or any time after treatments  Soreness in mouth or throat: . Baking Soda Rinse: a home remedy to soothe/cleanse mouth and loosen thick saliva.  Mix 1/2 teaspoon salt, 1/2 teaspoon baking soda, 1 pint water (16 oz or two cups).  Swish, gargle and spit as needed to soothe/cleanse mouth. Use as often as you want.  .   . .  . Narcotics: Various short acting and long acting narcotics can be prescribed.  Often, medical oncology will prescribe these if you are receiving chemotherapy concurrently. Narcotics may cause constipation. It may be helpful to take a stool softener (Docusate Sodium) or gentle laxative (ie Senna or Polyethylene Glycol) to prevent constipation.  Having food in your stomach before ingesting a narcotic may reduce risk of stomach upset.   .      . There is not a well-established safe and effective medication to combat radiation-induced fatigue.  However, if you are able to perform light exercise (such as a daily walk, yoga, recumbent stationary bicycling), this may combat fatigue and help you maintain muscle mass during treatment.rral, please let your nurse or physician know  . Try to get at least 8 hours of sleep each night. You may need a daily nap, but try not to nap so late that it interferes with your nightly sleep schedule.  .pt education done, biafine give  To apply to affected area once skin becomes itchy or irritated , increase protein in diet, drink plenty fluids stay hydrated, teach back given

## 2014-12-07 ENCOUNTER — Ambulatory Visit
Admission: RE | Admit: 2014-12-07 | Discharge: 2014-12-07 | Disposition: A | Discharge status: Home / Self Care | Payer: Medicare Other | Source: Ambulatory Visit | Attending: Radiation Oncology | Admitting: Radiation Oncology

## 2014-12-07 DIAGNOSIS — Z510 Encounter for antineoplastic radiation therapy: Secondary | ICD-10-CM | POA: Diagnosis not present

## 2014-12-07 DIAGNOSIS — E039 Hypothyroidism, unspecified: Secondary | ICD-10-CM | POA: Diagnosis not present

## 2014-12-07 DIAGNOSIS — F419 Anxiety disorder, unspecified: Secondary | ICD-10-CM | POA: Diagnosis not present

## 2014-12-07 DIAGNOSIS — E785 Hyperlipidemia, unspecified: Secondary | ICD-10-CM | POA: Diagnosis not present

## 2014-12-07 DIAGNOSIS — Z853 Personal history of malignant neoplasm of breast: Secondary | ICD-10-CM | POA: Diagnosis not present

## 2014-12-07 DIAGNOSIS — C7951 Secondary malignant neoplasm of bone: Secondary | ICD-10-CM | POA: Diagnosis not present

## 2014-12-08 ENCOUNTER — Ambulatory Visit
Admission: RE | Admit: 2014-12-08 | Discharge: 2014-12-08 | Disposition: A | Discharge status: Home / Self Care | Payer: Medicare Other | Source: Ambulatory Visit | Attending: Radiation Oncology | Admitting: Radiation Oncology

## 2014-12-08 ENCOUNTER — Encounter: Payer: Self-pay | Admitting: *Deleted

## 2014-12-08 DIAGNOSIS — Z853 Personal history of malignant neoplasm of breast: Secondary | ICD-10-CM | POA: Diagnosis not present

## 2014-12-08 DIAGNOSIS — E039 Hypothyroidism, unspecified: Secondary | ICD-10-CM | POA: Diagnosis not present

## 2014-12-08 DIAGNOSIS — Z510 Encounter for antineoplastic radiation therapy: Secondary | ICD-10-CM | POA: Diagnosis not present

## 2014-12-08 DIAGNOSIS — E785 Hyperlipidemia, unspecified: Secondary | ICD-10-CM | POA: Diagnosis not present

## 2014-12-08 DIAGNOSIS — F419 Anxiety disorder, unspecified: Secondary | ICD-10-CM | POA: Diagnosis not present

## 2014-12-08 DIAGNOSIS — C7951 Secondary malignant neoplasm of bone: Secondary | ICD-10-CM | POA: Diagnosis not present

## 2014-12-08 NOTE — Progress Notes (Signed)
[name] Psychosocial Distress Screening Clinical Social Work  Clinical Social Work was referred by distress screening protocol.  The patient scored a 8 on the Psychosocial Distress Thermometer which indicates severe distress. Clinical Social Worker contacted patient to assess for distress and other psychosocial needs. Erica Young shared she has completed two radiation treatments. She reported no concerns at this time.  She shared relating to God is not a concern, stating "I have this faith jesus is helping me through this. I pray to God when I'm on the machine, because how else do I get through them?".  CSW and patient discussed the importance of her faith and her perspective of "God's plan" for her life.  [name] has been attending the Living with cancer support group, CSW encouraged her to attend upcoming Living with cancer 6-week class.  CSW encouraged patient to call CSW to receive more information on programming, emotional support, or any other questions.  ON[facility] DISTRESS SCREENING 11/22/2014  Screening Type Initial Screening  Distress experienced in past week (1-10) 8  Practical problem type Housing  Family Problem type Partner  Emotional problem type Nervousness/Anxiety;Boredom  Spiritual/Religous concerns type Relating to God  Information Concerns Type Lack of info about treatment;Lack of info about maintaining fitness  Physical Problem type Pain;Sleep/insomnia;Breathing;Tingling hands/feet;Skin dry/itchy  Physician notified of physical symptoms Yes  Referral to clinical social work Yes  Referral to financial advocate Yes    Clinical Social Worker follow up needed: No.  If yes, follow up plan:  Polo Riley, MSW, LCSW, OSW-C Clinical Social Worker [place] [phone]

## 2014-12-09 ENCOUNTER — Ambulatory Visit
Admission: RE | Admit: 2014-12-09 | Discharge: 2014-12-09 | Disposition: A | Discharge status: Home / Self Care | Payer: Medicare Other | Source: Ambulatory Visit | Attending: Radiation Oncology | Admitting: Radiation Oncology

## 2014-12-09 DIAGNOSIS — E785 Hyperlipidemia, unspecified: Secondary | ICD-10-CM | POA: Diagnosis not present

## 2014-12-09 DIAGNOSIS — C7951 Secondary malignant neoplasm of bone: Secondary | ICD-10-CM | POA: Diagnosis not present

## 2014-12-09 DIAGNOSIS — Z853 Personal history of malignant neoplasm of breast: Secondary | ICD-10-CM | POA: Diagnosis not present

## 2014-12-09 DIAGNOSIS — F419 Anxiety disorder, unspecified: Secondary | ICD-10-CM | POA: Diagnosis not present

## 2014-12-09 DIAGNOSIS — Z510 Encounter for antineoplastic radiation therapy: Secondary | ICD-10-CM | POA: Diagnosis not present

## 2014-12-09 DIAGNOSIS — E039 Hypothyroidism, unspecified: Secondary | ICD-10-CM | POA: Diagnosis not present

## 2014-12-10 ENCOUNTER — Encounter: Payer: Self-pay | Admitting: Radiation Oncology

## 2014-12-10 ENCOUNTER — Ambulatory Visit
Admission: RE | Admit: 2014-12-10 | Discharge: 2014-12-10 | Disposition: A | Discharge status: Home / Self Care | Payer: Medicare Other | Source: Ambulatory Visit | Attending: Radiation Oncology | Admitting: Radiation Oncology

## 2014-12-10 VITALS — BP 152/69 | HR 68 | Resp 16 | Wt 133.5 lb

## 2014-12-10 DIAGNOSIS — Z510 Encounter for antineoplastic radiation therapy: Secondary | ICD-10-CM | POA: Diagnosis not present

## 2014-12-10 DIAGNOSIS — E039 Hypothyroidism, unspecified: Secondary | ICD-10-CM | POA: Diagnosis not present

## 2014-12-10 DIAGNOSIS — C7951 Secondary malignant neoplasm of bone: Secondary | ICD-10-CM

## 2014-12-10 DIAGNOSIS — Z853 Personal history of malignant neoplasm of breast: Secondary | ICD-10-CM | POA: Diagnosis not present

## 2014-12-10 DIAGNOSIS — E785 Hyperlipidemia, unspecified: Secondary | ICD-10-CM | POA: Diagnosis not present

## 2014-12-10 DIAGNOSIS — F419 Anxiety disorder, unspecified: Secondary | ICD-10-CM | POA: Diagnosis not present

## 2014-12-10 NOTE — Progress Notes (Signed)
  Radiation Oncology         [phone]     Name: Erica Young MRN: [id number]   Date: 12/10/2014  DOB: 1940-08-29   Weekly Radiation Therapy Management    ICD-9-CM ICD-10-CM   1. Bone metastases 198.5 C79.51     Current Dose: 15 Gy  Planned Dose:  30 Gy  Narrative The patient presents for routine under treatment assessment. Weight and vitals stable. Denies pain. Denies taking Decadron. Reports occasional headaches, dizziness, diplopia and blurry vision. Reports ringing in the ears is constant and "horrible." Reports fatigue and low energy. Reports insomnia, attributes this to anxiety. Reports nausea this morning. Reports loss of appetite. No skin changes noted within treatment field. Reports using biafine bid as directed. She currently takes xanax for anxiety. She was curious if taking flonase and zyrtec were okay with radiation.  The patient is without complaint. Set-up films were reviewed. The chart was checked.  Physical Findings  weight is 133 lb 8 oz (60.555 kg). Her blood pressure is 152/69 and her pulse is 68. Her respiration is 16. . Weight essentially stable.  No significant changes.  Impression The patient is tolerating radiation.  Plan Continue treatment as planned. Advised that flonase and zyrtec are okay to take while receiving radiation.     This document serves as a record of services personally performed by Tyler Pita, MD. It was created on his behalf by Arlyce Harman, a trained medical scribe. The creation of this record is based on the scribe's personal observations and the provider's statements to them. This document has been checked and approved by the attending provider.       Erica Young, M.D.

## 2014-12-10 NOTE — Progress Notes (Addendum)
Weight and vitals stable. Denies pain. Denies taking Decadron. Reports occasional headaches, dizziness, diplopia and blurry vision. Reports ringing in the ears is constant and "horrible." Reports fatigue and low energy. Reports insomnia. No skin changes noted within treatment field. Reports using biafine bid as directed.  BP 152/69 mmHg  Pulse 68  Resp 16  Wt 133 lb 8 oz (60.555 kg) Wt Readings from Last 3 Encounters:  12/10/14 133 lb 8 oz (60.555 kg)  11/22/14 135 lb 1.6 oz (61.281 kg)  11/11/14 133 lb (60.328 kg)

## 2014-12-13 ENCOUNTER — Other Ambulatory Visit: Payer: Self-pay | Admitting: Internal Medicine

## 2014-12-13 ENCOUNTER — Ambulatory Visit
Admission: RE | Admit: 2014-12-13 | Discharge: 2014-12-13 | Disposition: A | Discharge status: Home / Self Care | Payer: Medicare Other | Source: Ambulatory Visit | Attending: Radiation Oncology | Admitting: Radiation Oncology

## 2014-12-13 DIAGNOSIS — F419 Anxiety disorder, unspecified: Secondary | ICD-10-CM | POA: Diagnosis not present

## 2014-12-13 DIAGNOSIS — Z853 Personal history of malignant neoplasm of breast: Secondary | ICD-10-CM | POA: Diagnosis not present

## 2014-12-13 DIAGNOSIS — C7951 Secondary malignant neoplasm of bone: Secondary | ICD-10-CM | POA: Diagnosis not present

## 2014-12-13 DIAGNOSIS — E785 Hyperlipidemia, unspecified: Secondary | ICD-10-CM | POA: Diagnosis not present

## 2014-12-13 DIAGNOSIS — Z510 Encounter for antineoplastic radiation therapy: Secondary | ICD-10-CM | POA: Diagnosis not present

## 2014-12-13 DIAGNOSIS — E039 Hypothyroidism, unspecified: Secondary | ICD-10-CM | POA: Diagnosis not present

## 2014-12-14 ENCOUNTER — Ambulatory Visit
Admission: RE | Admit: 2014-12-14 | Discharge: 2014-12-14 | Disposition: A | Discharge status: Home / Self Care | Payer: Medicare Other | Source: Ambulatory Visit | Attending: Radiation Oncology | Admitting: Radiation Oncology

## 2014-12-14 DIAGNOSIS — C7951 Secondary malignant neoplasm of bone: Secondary | ICD-10-CM | POA: Diagnosis not present

## 2014-12-14 DIAGNOSIS — F419 Anxiety disorder, unspecified: Secondary | ICD-10-CM | POA: Diagnosis not present

## 2014-12-14 DIAGNOSIS — E785 Hyperlipidemia, unspecified: Secondary | ICD-10-CM | POA: Diagnosis not present

## 2014-12-14 DIAGNOSIS — Z510 Encounter for antineoplastic radiation therapy: Secondary | ICD-10-CM | POA: Diagnosis not present

## 2014-12-14 DIAGNOSIS — E039 Hypothyroidism, unspecified: Secondary | ICD-10-CM | POA: Diagnosis not present

## 2014-12-14 DIAGNOSIS — Z853 Personal history of malignant neoplasm of breast: Secondary | ICD-10-CM | POA: Diagnosis not present

## 2014-12-15 ENCOUNTER — Ambulatory Visit
Admission: RE | Admit: 2014-12-15 | Discharge: 2014-12-15 | Disposition: A | Discharge status: Home / Self Care | Payer: Medicare Other | Source: Ambulatory Visit | Attending: Radiation Oncology | Admitting: Radiation Oncology

## 2014-12-15 DIAGNOSIS — E785 Hyperlipidemia, unspecified: Secondary | ICD-10-CM | POA: Diagnosis not present

## 2014-12-15 DIAGNOSIS — Z510 Encounter for antineoplastic radiation therapy: Secondary | ICD-10-CM | POA: Diagnosis not present

## 2014-12-15 DIAGNOSIS — C7951 Secondary malignant neoplasm of bone: Secondary | ICD-10-CM | POA: Diagnosis not present

## 2014-12-15 DIAGNOSIS — E039 Hypothyroidism, unspecified: Secondary | ICD-10-CM | POA: Diagnosis not present

## 2014-12-15 DIAGNOSIS — F419 Anxiety disorder, unspecified: Secondary | ICD-10-CM | POA: Diagnosis not present

## 2014-12-15 DIAGNOSIS — Z853 Personal history of malignant neoplasm of breast: Secondary | ICD-10-CM | POA: Diagnosis not present

## 2014-12-16 ENCOUNTER — Ambulatory Visit
Admission: RE | Admit: 2014-12-16 | Discharge: 2014-12-16 | Disposition: A | Discharge status: Home / Self Care | Payer: Medicare Other | Source: Ambulatory Visit | Attending: Radiation Oncology | Admitting: Radiation Oncology

## 2014-12-16 DIAGNOSIS — Z510 Encounter for antineoplastic radiation therapy: Secondary | ICD-10-CM | POA: Diagnosis not present

## 2014-12-16 DIAGNOSIS — E039 Hypothyroidism, unspecified: Secondary | ICD-10-CM | POA: Diagnosis not present

## 2014-12-16 DIAGNOSIS — C7951 Secondary malignant neoplasm of bone: Secondary | ICD-10-CM | POA: Diagnosis not present

## 2014-12-16 DIAGNOSIS — F419 Anxiety disorder, unspecified: Secondary | ICD-10-CM | POA: Diagnosis not present

## 2014-12-16 DIAGNOSIS — Z853 Personal history of malignant neoplasm of breast: Secondary | ICD-10-CM | POA: Diagnosis not present

## 2014-12-16 DIAGNOSIS — E785 Hyperlipidemia, unspecified: Secondary | ICD-10-CM | POA: Diagnosis not present

## 2014-12-17 ENCOUNTER — Encounter: Payer: Self-pay | Admitting: Radiation Oncology

## 2014-12-17 ENCOUNTER — Ambulatory Visit
Admission: RE | Admit: 2014-12-17 | Discharge: 2014-12-17 | Disposition: A | Discharge status: Home / Self Care | Payer: Medicare Other | Source: Ambulatory Visit | Attending: Radiation Oncology | Admitting: Radiation Oncology

## 2014-12-17 ENCOUNTER — Other Ambulatory Visit (HOSPITAL_BASED_OUTPATIENT_CLINIC_OR_DEPARTMENT_OTHER): Payer: Medicare Other

## 2014-12-17 ENCOUNTER — Ambulatory Visit (HOSPITAL_BASED_OUTPATIENT_CLINIC_OR_DEPARTMENT_OTHER): Payer: Medicare Other

## 2014-12-17 VITALS — BP 145/62 | HR 74 | Temp 97.8°F | Resp 20 | Wt 133.5 lb

## 2014-12-17 VITALS — BP 149/72 | HR 70 | Temp 98.4°F

## 2014-12-17 DIAGNOSIS — E039 Hypothyroidism, unspecified: Secondary | ICD-10-CM | POA: Diagnosis not present

## 2014-12-17 DIAGNOSIS — C50911 Malignant neoplasm of unspecified site of right female breast: Secondary | ICD-10-CM | POA: Diagnosis present

## 2014-12-17 DIAGNOSIS — Z853 Personal history of malignant neoplasm of breast: Secondary | ICD-10-CM

## 2014-12-17 DIAGNOSIS — C781 Secondary malignant neoplasm of mediastinum: Secondary | ICD-10-CM | POA: Diagnosis not present

## 2014-12-17 DIAGNOSIS — Z510 Encounter for antineoplastic radiation therapy: Secondary | ICD-10-CM | POA: Diagnosis not present

## 2014-12-17 DIAGNOSIS — E785 Hyperlipidemia, unspecified: Secondary | ICD-10-CM | POA: Diagnosis not present

## 2014-12-17 DIAGNOSIS — F419 Anxiety disorder, unspecified: Secondary | ICD-10-CM | POA: Diagnosis not present

## 2014-12-17 DIAGNOSIS — C7951 Secondary malignant neoplasm of bone: Secondary | ICD-10-CM

## 2014-12-17 LAB — COMPREHENSIVE METABOLIC PANEL (CC13)
ALT: 19 U/L (ref 0–55)
AST: 16 U/L (ref 5–34)
Albumin: 3.8 g/dL (ref 3.5–5.0)
Alkaline Phosphatase: 55 U/L (ref 40–150)
Anion Gap: 10 mEq/L (ref 3–11)
BUN: 14.8 mg/dL (ref 7.0–26.0)
CALCIUM: 9.2 mg/dL (ref 8.4–10.4)
CHLORIDE: 109 meq/L (ref 98–109)
CO2: 22 mEq/L (ref 22–29)
CREATININE: 0.9 mg/dL (ref 0.6–1.1)
EGFR: 64 mL/min/{1.73_m2} — ABNORMAL LOW (ref >90)
GLUCOSE: 166 mg/dL — AB (ref 70–140)
Potassium: 3.9 mEq/L (ref 3.5–5.1)
SODIUM: 141 meq/L (ref 136–145)
Total Bilirubin: 0.51 mg/dL (ref 0.20–1.20)
Total Protein: 6.6 g/dL (ref 6.4–8.3)

## 2014-12-17 MED ORDER — DENOSUMAB 120 MG/1.7ML ~~LOC~~ SOLN
120.0000 mg | Freq: Once | SUBCUTANEOUS | Status: AC
  Administered 2014-12-17: 120 mg via SUBCUTANEOUS
  Filled 2014-12-17: qty 1.7

## 2014-12-17 NOTE — Progress Notes (Signed)
Department of Radiation Oncology  Phone:  [phone] Fax:        [phone]  Weekly Treatment Note    Name: Erica Young Date: 12/17/2014 MRN: [id number] DOB: 1941-02-06   Current dose: 30 Gy  Current fraction:10   MEDICATIONS: Current Outpatient Prescriptions  Medication Sig Dispense Refill  . ALPRAZolam (XANAX) 0.25 MG tablet TAKE 1 TABLET BY MOUTH THREE TIMES DAILY AS NEEDED FOR ANXIETY 90 tablet 2  . anastrozole (ARIMIDEX) 1 MG tablet TAKE 1 TABLET BY MOUTH EVERY DAY 30 tablet 0  . aspirin EC 81 MG tablet Take 81 mg by mouth daily.    . Calcium-Vitamin D (CALTRATE 600 PLUS-VIT D PO) Take 1 tablet by mouth 2 (two) times daily.     . cetirizine (ZYRTEC) 10 MG tablet Take 10 mg by mouth every other day.     . Cyanocobalamin (VITAMIN B 12 PO) Take 100 mcg by mouth daily.    Marland Kitchen denosumab (XGEVA) 120 MG/1.7ML SOLN injection Inject 120 mg into the skin every 30 (thirty) days.    Marland Kitchen diltiazem (CARDIZEM CD) 120 MG 24 hr capsule Take 1 capsule (120 mg total) by mouth daily. 90 capsule 4  . emollient (BIAFINE) cream Apply 1 application topically 2 (two) times daily.    . Melatonin 10 MG TABS Take 1 tablet by mouth daily.    . Multiple Vitamin (MULTIVITAMIN WITH MINERALS) TABS tablet Take 1 tablet by mouth daily.    . Probiotic Product (PROBIOTIC DAILY PO) Take 1 capsule by mouth once. Takes 1 a day    . simvastatin (ZOCOR) 5 MG tablet Take 1 tablet (5 mg total) by mouth daily. 90 tablet 2   No current facility-administered medications for this encounter.     ALLERGIES: Ciprofloxacin   LABORATORY DATA:  Lab Results  Component Value Date   WBC 5.5 10/06/2014   HGB 14.0 10/06/2014   HCT 41.2 10/06/2014   MCV 95.6 10/06/2014   PLT 151 10/06/2014   Lab Results  Component Value Date   NA 141 12/17/2014   K 3.9 12/17/2014   CL 104 01/06/2014   CO2 22 12/17/2014   Lab Results  Component Value Date   ALT 19 12/17/2014   AST 16 12/17/2014   ALKPHOS 55 12/17/2014   BILITOT 0.51 12/17/2014     NARRATIVE: Erica Young was seen today for weekly treatment management. The chart was checked and the patient's films were reviewed.  Weekly rad trxs. Ethmoid/brain today last tx 10/10, says her nose has cleared up , no head aches but is having electrical shocks right side ear and makes her off balance states. Started either Friday or Saturday has happeded numerous times, some nausea at times none today, gave 1 month follow up appointment.   PHYSICAL EXAMINATION: weight is 133 lb 8 oz (60.555 kg). Her oral temperature is 97.8 F (36.6 C). Her blood pressure is 145/62 and her pulse is 74. Her respiration is 20 and oxygen saturation is 98%.        ASSESSMENT: The patient did satisfactorily with treatment.  PLAN: The patient will follow-up in our clinic in 1 month.  ------------------------------------------------  Jodelle Gross, MD, PhD  This document serves as a record of services personally performed by Kyung Rudd, MD. It was created on his behalf by Derek Mound, a trained medical scribe. The creation of this record is based on the scribe's personal observations and the provider's statements to them. This document has been checked and approved by the  attending provider.

## 2014-12-17 NOTE — Progress Notes (Signed)
Weekly rad trxs  Ethmoid/brain today  last tx 10/10, says her nose has cleared up , no head aches but is having electrical shocks right side ear and makes her off balance states  Started either Friday or  Saturday has happedned numerous times, some nausea at times none today, gave 1 month follow up appt 2:09 PM BP 145/62 mmHg  Pulse 74  Temp(Src) 97.8 F (36.6 C) (Oral)  Resp 20  Wt 133 lb 8 oz (60.555 kg)  SpO2 98%  Wt Readings from Last 3 Encounters:  12/17/14 133 lb 8 oz (60.555 kg)  12/10/14 133 lb 8 oz (60.555 kg)  11/22/14 135 lb 1.6 oz (61.281 kg)

## 2014-12-20 ENCOUNTER — Telehealth: Payer: Self-pay | Admitting: *Deleted

## 2014-12-20 NOTE — Telephone Encounter (Signed)
Returned call to patient  She had left vm after 5pm on Friday, asking about if she should continue to wear the baifine cream, and when she could wear make up, informed her she could wear the cream as long as she felt she needed it and up to her when sh wants to wear makeup, no restrictions,patient thanked this RN for returning her call 9:00 AM

## 2014-12-20 NOTE — Telephone Encounter (Signed)
error 

## 2014-12-29 DIAGNOSIS — H10023 Other mucopurulent conjunctivitis, bilateral: Secondary | ICD-10-CM | POA: Diagnosis not present

## 2015-01-04 ENCOUNTER — Other Ambulatory Visit: Payer: Self-pay | Admitting: Internal Medicine

## 2015-01-07 ENCOUNTER — Telehealth: Payer: Self-pay | Admitting: Internal Medicine

## 2015-01-07 MED ORDER — MECLIZINE HCL 25 MG PO TABS: 25.0000 mg | ORAL_TABLET | Freq: Three times a day (TID) | ORAL | Status: DC | PRN

## 2015-01-07 NOTE — Telephone Encounter (Signed)
Pt states her vertigo has returned. Dr Raliegh Ip gave her med at last visit . Possibly meclizine (ANTIVERT) 25 MG tablet     [facility]/ [address]

## 2015-01-07 NOTE — Telephone Encounter (Signed)
Spoke to pt told her Rx for Meclizine for dizziness sent to pharmacy. Pt verbalized understanding.

## 2015-01-12 ENCOUNTER — Other Ambulatory Visit: Payer: Self-pay | Admitting: Internal Medicine

## 2015-01-13 ENCOUNTER — Ambulatory Visit (HOSPITAL_BASED_OUTPATIENT_CLINIC_OR_DEPARTMENT_OTHER): Payer: Medicare Other | Admitting: Internal Medicine

## 2015-01-13 ENCOUNTER — Encounter: Payer: Self-pay | Admitting: Internal Medicine

## 2015-01-13 ENCOUNTER — Other Ambulatory Visit (HOSPITAL_BASED_OUTPATIENT_CLINIC_OR_DEPARTMENT_OTHER): Payer: Medicare Other

## 2015-01-13 ENCOUNTER — Telehealth: Payer: Self-pay | Admitting: Internal Medicine

## 2015-01-13 ENCOUNTER — Ambulatory Visit (HOSPITAL_BASED_OUTPATIENT_CLINIC_OR_DEPARTMENT_OTHER): Payer: Medicare Other

## 2015-01-13 VITALS — BP 127/78 | HR 80 | Temp 97.7°F | Resp 18 | Ht 62.0 in | Wt 134.4 lb

## 2015-01-13 DIAGNOSIS — Z853 Personal history of malignant neoplasm of breast: Secondary | ICD-10-CM

## 2015-01-13 DIAGNOSIS — C7951 Secondary malignant neoplasm of bone: Secondary | ICD-10-CM | POA: Diagnosis not present

## 2015-01-13 DIAGNOSIS — C781 Secondary malignant neoplasm of mediastinum: Secondary | ICD-10-CM

## 2015-01-13 DIAGNOSIS — C50911 Malignant neoplasm of unspecified site of right female breast: Secondary | ICD-10-CM

## 2015-01-13 DIAGNOSIS — C50919 Malignant neoplasm of unspecified site of unspecified female breast: Secondary | ICD-10-CM | POA: Diagnosis not present

## 2015-01-13 LAB — CBC WITH DIFFERENTIAL/PLATELET
BASO%: 0.6 % (ref 0.0–2.0)
BASOS ABS: 0 10*3/uL (ref 0.0–0.1)
EOS%: 6.3 % (ref 0.0–7.0)
Eosinophils Absolute: 0.3 10*3/uL (ref 0.0–0.5)
HEMATOCRIT: 40.8 % (ref 34.8–46.6)
HEMOGLOBIN: 13.9 g/dL (ref 11.6–15.9)
LYMPH#: 1.5 10*3/uL (ref 0.9–3.3)
LYMPH%: 30.4 % (ref 14.0–49.7)
MCH: 32.9 pg (ref 25.1–34.0)
MCHC: 34.1 g/dL (ref 31.5–36.0)
MCV: 96.7 fL (ref 79.5–101.0)
MONO#: 0.4 10*3/uL (ref 0.1–0.9)
MONO%: 7.1 % (ref 0.0–14.0)
NEUT#: 2.8 10*3/uL (ref 1.5–6.5)
NEUT%: 55.6 % (ref 38.4–76.8)
Platelets: 128 10*3/uL — ABNORMAL LOW (ref 145–400)
RBC: 4.22 10*6/uL (ref 3.70–5.45)
RDW: 13.2 % (ref 11.2–14.5)
WBC: 5.1 10*3/uL (ref 3.9–10.3)

## 2015-01-13 LAB — COMPREHENSIVE METABOLIC PANEL (CC13)
ALK PHOS: 53 U/L (ref 40–150)
ALT: 17 U/L (ref 0–55)
AST: 17 U/L (ref 5–34)
Albumin: 3.7 g/dL (ref 3.5–5.0)
Anion Gap: 9 mEq/L (ref 3–11)
BUN: 14.9 mg/dL (ref 7.0–26.0)
CALCIUM: 9.1 mg/dL (ref 8.4–10.4)
CO2: 27 mEq/L (ref 22–29)
Chloride: 107 mEq/L (ref 98–109)
Creatinine: 0.9 mg/dL (ref 0.6–1.1)
EGFR: 67 mL/min/{1.73_m2} — AB (ref >90)
Glucose: 127 mg/dl (ref 70–140)
POTASSIUM: 4.1 meq/L (ref 3.5–5.1)
Sodium: 143 mEq/L (ref 136–145)
TOTAL PROTEIN: 6.5 g/dL (ref 6.4–8.3)
Total Bilirubin: 0.44 mg/dL (ref 0.20–1.20)

## 2015-01-13 LAB — CANCER ANTIGEN 27.29: CA 27.29: 23 U/mL (ref 0–39)

## 2015-01-13 MED ORDER — DENOSUMAB 120 MG/1.7ML ~~LOC~~ SOLN
120.0000 mg | Freq: Once | SUBCUTANEOUS | Status: AC
  Administered 2015-01-13: 120 mg via SUBCUTANEOUS
  Filled 2015-01-13: qty 1.7

## 2015-01-13 NOTE — Progress Notes (Signed)
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 The patient has a history of right breast carcinoma status post lumpectomy followed by radiotherapy in 2007 diagnosed in [place] and the patient declined any further treatment at that time.   PRIOR THERAPY: None  CURRENT THERAPY:  1) Arimidex 1 mg by mouth daily. Started 01/22/2014. 2) Xgeva 120 g subcutaneously monthly.  INTERVAL HISTORY: Erica Young 74 y.o. female returns to the clinic today for followup visit accompanied by her husband. The patient has no complaints today. She denied having any significant weight loss or night sweats. She has no nausea or vomiting. The patient denied having any significant fever or chills. She denied having any significant adverse effects from the treatment with Arimidex. She is also on treatment with Xgeva for the metastatic bone disease. She had repeat CBC, comprehensive metabolic panel and CA 30.86 performed recently and she is here for evaluation and discussion of her lab results.  MEDICAL HISTORY: Past Medical History  Diagnosis Date  . HYPERLIPIDEMIA 03/03/2010  . CEREBROVASCULAR DISEASE 03/03/2010  . DIVERTICULOSIS, COLON 03/03/2010  . Low back pain   . Right leg pain   . History of cerebral artery stenosis     right middle  . Mastoiditis     noted on brain MRI  . Foramen ovale     positive bubble study  . Diverticulitis   . Fatty liver 08/02/09    as per U/S done by [facility]  . Internal hemorrhoid   . Anxiety   . Hypertension   . Ulcer   . Stroke Oct. 2011    TIA  . HYPOTHYROIDISM 03/03/2010    no longer on meds  . Shortness of breath   . Headache(784.0)     she thinks sinus headaches  . Rib fracture   . Cancer 2007    right breat  ca  , lumpectomy and radiation tx (declined chemo and additional prophylactic meds)  . Bone cancer mri 11/11/14    right parietal bone,left cribriform plate metastases  . Allergy     ALLERGIES:  is allergic to ciprofloxacin.  MEDICATIONS:  Current Outpatient Prescriptions  Medication Sig Dispense Refill  . ALPRAZolam (XANAX) 0.25 MG tablet TAKE 1 TABLET BY MOUTH THREE TIMES DAILY AS NEEDED FOR ANXIETY 90 tablet 2  . anastrozole (ARIMIDEX) 1 MG tablet TAKE 1 TABLET BY MOUTH EVERY DAY 30 tablet 0  . aspirin EC 81 MG tablet Take 81 mg by mouth daily.    . Calcium-Vitamin D (CALTRATE 600 PLUS-VIT D PO) Take 1 tablet by mouth 2 (two) times daily.     . Cyanocobalamin (VITAMIN B 12 PO) Take 100 mcg by mouth daily.    Marland Kitchen denosumab (XGEVA) 120 MG/1.7ML SOLN injection Inject 120 mg into the skin every 30 (thirty) days.    Marland Kitchen diltiazem (CARDIZEM CD) 120 MG 24 hr capsule Take 1 capsule (120 mg total) by mouth daily. 90 capsule 4  . meclizine (ANTIVERT) 25 MG tablet Take 1 tablet (25 mg total) by mouth 3 (three) times daily as needed for dizziness. 30 tablet 2  . Melatonin 10 MG TABS Take 1 tablet by mouth daily.    . Multiple Vitamin (MULTIVITAMIN WITH MINERALS) TABS tablet Take 1 tablet by mouth daily.    . Probiotic  Product (PROBIOTIC DAILY PO) Take 1 capsule by mouth once. Takes 1 a day    . simvastatin (ZOCOR) 5 MG tablet Take 1 tablet (5 mg total) by mouth daily. 90 tablet 2  . cetirizine (ZYRTEC) 10 MG tablet Take 10 mg by mouth daily as needed.     Marland Kitchen emollient (BIAFINE) cream Apply 1 application topically 2 (two) times daily.     No current facility-administered medications for this visit.    SURGICAL HISTORY:  Past Surgical History  Procedure Laterality Date  . Cholecystectomy    . Breast lumpectomy      right  . Tonsillectomy    . Colonoscopy    . Tubal ligation    . Shoulder surgery      right shoulder (dislocation)  . Video bronchoscopy with endobronchial ultrasound N/A 01/07/2014     Procedure: VIDEO BRONCHOSCOPY WITH ENDOBRONCHIAL ULTRASOUND;  Surgeon: Ivin Poot, MD;  Location: [facility] OR;  Service: Thoracic;  Laterality: N/A;    REVIEW OF SYSTEMS:  Constitutional: negative Eyes: negative Ears, nose, mouth, throat, and face: negative Respiratory: negative Cardiovascular: negative Gastrointestinal: negative Genitourinary:negative Integument/breast: negative Hematologic/lymphatic: negative Musculoskeletal:negative Neurological: negative Behavioral/Psych: negative Endocrine: negative Allergic/Immunologic: negative   PHYSICAL EXAMINATION: General appearance: alert, cooperative and no distress Head: Normocephalic, without obvious abnormality, atraumatic Neck: no adenopathy, no JVD, supple, symmetrical, trachea midline and thyroid not enlarged, symmetric, no tenderness/mass/nodules Lymph nodes: Cervical, supraclavicular, and axillary nodes normal. Resp: clear to auscultation bilaterally Back: symmetric, no curvature. ROM normal. No CVA tenderness. Cardio: regular rate and rhythm, S1, S2 normal, no murmur, click, rub or gallop GI: soft, non-tender; bowel sounds normal; no masses,  no organomegaly Extremities: extremities normal, atraumatic, no cyanosis or edema Neurologic: Alert and oriented X 3, normal strength and tone. Normal symmetric reflexes. Normal coordination and gait  ECOG PERFORMANCE STATUS: 1 - Symptomatic but completely ambulatory  Blood pressure 127/78, pulse 80, temperature 97.7 F (36.5 C), temperature source Oral, resp. rate 18, height '5\' 2"'$  (1.575 m), weight 134 lb 6.4 oz (60.963 kg), SpO2 98 %.  LABORATORY DATA: Lab Results  Component Value Date   WBC 5.1 01/13/2015   HGB 13.9 01/13/2015   HCT 40.8 01/13/2015   MCV 96.7 01/13/2015   PLT 128* 01/13/2015      Chemistry      Component Value Date/Time   NA 143 01/13/2015 0830   NA 138 01/06/2014 0910   K 4.1 01/13/2015 0830   K 4.3 01/06/2014 0910   CL 104 01/06/2014 0910   CO2 27  01/13/2015 0830   CO2 22 01/06/2014 0910   BUN 14.9 01/13/2015 0830   BUN 9 01/06/2014 0910   CREATININE 0.9 01/13/2015 0830   CREATININE 0.56 01/06/2014 0910      Component Value Date/Time   CALCIUM 9.1 01/13/2015 0830   CALCIUM 9.0 01/06/2014 0910   ALKPHOS 53 01/13/2015 0830   ALKPHOS 87 01/06/2014 0910   AST 17 01/13/2015 0830   AST 17 01/06/2014 0910   ALT 17 01/13/2015 0830   ALT 16 01/06/2014 0910   BILITOT 0.44 01/13/2015 0830   BILITOT 0.3 01/06/2014 0910       RADIOGRAPHIC STUDIES: No results found. 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 Her disease is mainly in the bone as well as the mediastinum. The patient is currently asymptomatic and has no visceral crisis. She is currently on treatment with Arimidex 1  mg by mouth daily since 01/22/2014 and tolerating her treatment fairly well with no significant adverse effects. The patient has few question about the duration of treatment with her hormonal therapy and I explained to her that she would be treated until disease progression or if she has any concerning adverse effects. I also gave her the option of discontinuing the treatment at any time if she is not interested in any further treatment. I discussed the lab result with the patient and her husband today. I recommended for her to continue with the same treatment regimen. She would come back for follow-up visit in 3 months for reevaluation with repeat CBC, comprehensive metabolic panel and CA 66.59 in addition to repeat CT scan of the chest, abdomen and pelvis and bone scan.  2) Metastatic bone disease: She was started on [place]. I also advised the patient to take a daily calcium and vitamin D supplements.  3) Followup visit: The patient would come back for followup visit in 3 months for evaluation and management any adverse effect of her treatment.  She was  advised to call immediately if she has any concerning symptoms in the interval.  The patient voices understanding of current disease status and treatment options and is in agreement with the current care plan.  All questions were answered. The patient knows to call the clinic with any problems, questions or concerns. We can certainly see the patient much sooner if necessary.  Disclaimer: This note was dictated with voice recognition software. Similar sounding words can inadvertently be transcribed and may not be corrected upon review.

## 2015-01-13 NOTE — Telephone Encounter (Signed)
Gave and printd appt sched and avs for pt for OCT thru [facility]

## 2015-01-17 NOTE — Progress Notes (Signed)
  Radiation Oncology         [phone]) [phone] ________________________________  Name: Erica Young MRN: [id number]  Date: 11/26/2014  DOB: 01-31-1941  SIMULATION AND TREATMENT PLANNING NOTE  DIAGNOSIS:     ICD-9-CM ICD-10-CM   1. Bone metastases 198.5 C79.51      Site:  Skull, ethmoid sinus  NARRATIVE:  The patient was brought to the [facility].  Identity was confirmed.  All relevant records and images related to the planned course of therapy were reviewed.   Written consent to proceed with treatment was confirmed which was freely given after reviewing the details related to the planned course of therapy had been reviewed with the patient.  Then, the patient was set-up in a stable reproducible  supine position for radiation therapy.  CT images were obtained.  Surface markings were placed.    Medically necessary complex treatment device(s) for immobilization:  Customized thermoplastic head chest.   The CT images were loaded into the planning software.  Then the target and avoidance structures were contoured.  Treatment planning then occurred.  The radiation prescription was entered and confirmed.  . Each of these customized fields/ complex treatment devices will be used on a daily basis during the radiation course. I have requested : Intensity Modulated Radiotherapy (IMRT) is medically necessary for this case for the following reason:  Avoidance of critical normal structures including the optic structures/eyes.   PLAN:  The patient will receive 30 Gy in 10 fractions.  ________________________________   Jodelle Gross, MD, PhD

## 2015-01-17 NOTE — Progress Notes (Signed)
  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 Both of these areas were treated using a IMRT technique to a dose of 30 gray in 10 fractions.  Narrative: The patient tolerated radiation treatment relatively well.   The patient's congestion markedly improved.  Plan: The patient has completed radiation treatment. The patient will return to radiation oncology clinic for routine followup in one month. I advised the patient to call or return sooner if they have any questions or concerns related to their recovery or treatment. ________________________________  Jodelle Gross, M.D., Ph.D.

## 2015-01-17 NOTE — Addendum Note (Signed)
Encounter addended by: Kyung Rudd, MD on: 01/17/2015  6:26 PM<BR>     Documentation filed: Notes Section, Visit Diagnoses

## 2015-01-19 ENCOUNTER — Telehealth: Payer: Self-pay | Admitting: Internal Medicine

## 2015-01-19 NOTE — Telephone Encounter (Signed)
[facility]  Patient Name: Erica Young  DOB: Oct 07, 1940    Initial Comment Caller states his wife is having vertigo symptoms. Has been vomiting and dizzy   Nurse Assessment  Nurse: Harlow Mares, RN, Suanne Marker Date/Time (Eastern Time): 01/19/2015 3:25:50 PM  Confirm and document reason for call. If symptomatic, describe symptoms. ---Caller states his wife is having vertigo symptoms. Has been vomiting and dizzy. Reports that she feels like the room is spinning. Reports that she has had radiation tx 3 weeks for breast CA/masses on the brain. Denies fever. Also reports of severe ringing in her ear. Denies diarrhea.  Has the patient traveled out of the country within the last 30 days? ---No  Does the patient require triage? ---Yes  Related visit to physician within the last 2 weeks? ---Yes   Ca MD  Does the PT have any chronic conditions? (i.e. diabetes, asthma, etc.) ---Yes  List chronic conditions. ---Ca (breast and brain), diverticulitis; hypertension     Guidelines    Guideline Title Affirmed Question Affirmed Notes  Vomiting [1] MODERATE vomiting (e.g., 3 - 5 times/day) AND [2] age > 59    Final Disposition User   Go to ED Now (or PCP triage) Harlow Mares, RN, Suanne Marker    Disagree/Comply: Leta Baptist

## 2015-01-20 NOTE — Telephone Encounter (Signed)
FYI

## 2015-01-27 ENCOUNTER — Encounter: Payer: Self-pay | Admitting: Radiation Oncology

## 2015-01-27 ENCOUNTER — Ambulatory Visit
Admission: RE | Admit: 2015-01-27 | Discharge: 2015-01-27 | Disposition: A | Discharge status: Home / Self Care | Payer: Medicare Other | Source: Ambulatory Visit | Attending: Radiation Oncology | Admitting: Radiation Oncology

## 2015-01-27 VITALS — BP 135/83 | HR 79 | Temp 97.7°F | Resp 16 | Ht 62.0 in | Wt 135.1 lb

## 2015-01-27 DIAGNOSIS — C7951 Secondary malignant neoplasm of bone: Secondary | ICD-10-CM

## 2015-01-27 NOTE — Progress Notes (Addendum)
Follow up s/p s/p rad txs 12/06/14-12/17/14, ethmoid sinus and right skull, has had vertigo 3 x last week, and sharp flashes through her right side of head past her eye and forehead, makes her so dizzy, started meclizine but stopped it mad her throw up right after taking, tried more than once , skin has healed,  She did have vertigo back in February before any rad txs,  HOH right ear and sometimes tinnitis, fatigued in am  arms and legs weak, resolves later in the afternoon, takes xgeva monthly and arimidex daily , eats 3 meals, but no taste 4:47 PM   BP 135/83 mmHg  Pulse 79  Temp(Src) 97.7 F (36.5 C) (Oral)  Resp 16  Ht '5\' 2"'$  (1.575 m)  Wt 135 lb 1.6 oz (61.281 kg)  BMI 24.70 kg/m2  SpO2 96%  Wt Readings from Last 3 Encounters:  01/27/15 135 lb 1.6 oz (61.281 kg)  01/13/15 134 lb 6.4 oz (60.963 kg)  12/17/14 133 lb 8 oz (60.555 kg)

## 2015-01-28 ENCOUNTER — Encounter: Payer: Self-pay | Admitting: Radiation Oncology

## 2015-01-28 NOTE — Progress Notes (Signed)
Radiation Oncology         [phone]) [phone] ________________________________  Name: Erica Young MRN: [id number]  Date: 01/27/2015  DOB: [birth year]  Follow-Up Visit Note  CC: [name], MD  Curt Bears, MD  Diagnosis:   Metastatic breast cancer with metastasis to the ethmoid sinus and right skull  Interval Since Last Radiation:  One month   Narrative:  The patient returns today for routine follow-up.    Follow up s/p s/p rad txs 12/06/14-12/17/14, ethmoid sinus and right skull, has had vertigo 3 x last week, and sharp flashes through her right side of head past her eye and forehead, makes her so dizzy, started meclizine but stopped it mad her throw up right after taking, tried more than once , skin has healed,  She did have vertigo back in February before any rad txs,  HOH right ear and sometimes tinnitis, fatigued in am  arms and legs weak, resolves later in the afternoon, takes xgeva monthly and arimidex daily , eats 3 meals, but no taste 8:30 AM   BP 135/83 mmHg  Pulse 79  Temp(Src) 97.7 F (36.5 C) (Oral)  Resp 16  Ht '5\' 2"'$  (1.575 m)  Wt 135 lb 1.6 oz (61.281 kg)  BMI 24.70 kg/m2  SpO2 96%  Wt Readings from Last 3 Encounters:  01/27/15 135 lb 1.6 oz (61.281 kg)  01/13/15 134 lb 6.4 oz (60.963 kg)  12/17/14 133 lb 8 oz (60.555 kg)                                ALLERGIES:  is allergic to ciprofloxacin.  Meds: Current Outpatient Prescriptions  Medication Sig Dispense Refill  . ALPRAZolam (XANAX) 0.25 MG tablet TAKE 1 TABLET BY MOUTH THREE TIMES DAILY AS NEEDED FOR ANXIETY 90 tablet 2  . anastrozole (ARIMIDEX) 1 MG tablet TAKE 1 TABLET BY MOUTH EVERY DAY 30 tablet 0  . aspirin EC 81 MG tablet Take 81 mg by mouth daily.    . Calcium-Vitamin D (CALTRATE 600 PLUS-VIT D PO) Take 1 tablet by mouth 2 (two) times daily.     . Cyanocobalamin (VITAMIN B 12 PO) Take 100 mcg by mouth daily.    Marland Kitchen denosumab (XGEVA) 120 MG/1.7ML SOLN injection Inject 120 mg into the skin  every 30 (thirty) days.    Marland Kitchen diltiazem (CARDIZEM CD) 120 MG 24 hr capsule Take 1 capsule (120 mg total) by mouth daily. 90 capsule 4  . meclizine (ANTIVERT) 25 MG tablet Take 1 tablet (25 mg total) by mouth 3 (three) times daily as needed for dizziness. 30 tablet 2  . Multiple Vitamin (MULTIVITAMIN WITH MINERALS) TABS tablet Take 1 tablet by mouth daily.    . Probiotic Product (PROBIOTIC DAILY PO) Take 1 capsule by mouth once. Takes 1 a day    . simvastatin (ZOCOR) 5 MG tablet Take 1 tablet (5 mg total) by mouth daily. 90 tablet 2  . cetirizine (ZYRTEC) 10 MG tablet Take 10 mg by mouth daily as needed.     Marland Kitchen emollient (BIAFINE) cream Apply 1 application topically 2 (two) times daily.    . Melatonin 10 MG TABS Take 1 tablet by mouth daily.     No current facility-administered medications for this encounter.    Physical Findings: The patient is in no acute distress. Patient is alert and oriented.  height is '5\' 2"'$  (1.575 m) and weight is 135 lb 1.6 oz (61.281 kg).  Her oral temperature is 97.7 F (36.5 C). Her blood pressure is 135/83 and her pulse is 79. Her respiration is 16 and oxygen saturation is 96%. .   The skin has healed with minimal hyperpigmentation in the treatment area  Lab Findings: Lab Results  Component Value Date   WBC 5.1 01/13/2015   HGB 13.9 01/13/2015   HCT 40.8 01/13/2015   MCV 96.7 01/13/2015   PLT 128* 01/13/2015     Radiographic Findings: No results found.  Impression:     The patient clinically is doing well. No ongoing difficulties with regards to her prior treatment. Congestion still remains much improved as compared to prior to her course of radiation treatment.   Plan:  The patient will follow-up in December after her upcoming CT scans. She will see medical oncology after the scans are completed as well.   Jodelle Gross, M.D., Ph.D.

## 2015-01-31 ENCOUNTER — Telehealth: Payer: Self-pay | Admitting: *Deleted

## 2015-01-31 NOTE — Telephone Encounter (Signed)
PT. WANTS TO CHANGE THE DATE OF HER APPOINTMENT WITH DR.MOHAMED.

## 2015-02-01 ENCOUNTER — Telehealth: Payer: Self-pay | Admitting: Medical Oncology

## 2015-02-01 ENCOUNTER — Telehealth: Payer: Self-pay | Admitting: Internal Medicine

## 2015-02-01 NOTE — Telephone Encounter (Signed)
I left message for pt to call back.

## 2015-02-01 NOTE — Telephone Encounter (Signed)
Pt confirmed labs/ov/inj r/s per 10/04 POF, gave pt AVS and Calendar... KJ

## 2015-02-01 NOTE — Telephone Encounter (Signed)
Requests to move f/u app to after scans.

## 2015-02-10 ENCOUNTER — Other Ambulatory Visit (HOSPITAL_BASED_OUTPATIENT_CLINIC_OR_DEPARTMENT_OTHER): Payer: Medicare Other

## 2015-02-10 ENCOUNTER — Ambulatory Visit (HOSPITAL_BASED_OUTPATIENT_CLINIC_OR_DEPARTMENT_OTHER): Payer: Medicare Other

## 2015-02-10 VITALS — BP 127/78 | HR 86 | Temp 97.8°F | Resp 16

## 2015-02-10 DIAGNOSIS — C7951 Secondary malignant neoplasm of bone: Secondary | ICD-10-CM

## 2015-02-10 DIAGNOSIS — C50911 Malignant neoplasm of unspecified site of right female breast: Secondary | ICD-10-CM

## 2015-02-10 DIAGNOSIS — Z853 Personal history of malignant neoplasm of breast: Secondary | ICD-10-CM

## 2015-02-10 LAB — COMPREHENSIVE METABOLIC PANEL (CC13)
ALT: 22 U/L (ref 0–55)
AST: 20 U/L (ref 5–34)
Albumin: 3.9 g/dL (ref 3.5–5.0)
Alkaline Phosphatase: 64 U/L (ref 40–150)
Anion Gap: 6 mEq/L (ref 3–11)
BUN: 13.8 mg/dL (ref 7.0–26.0)
CHLORIDE: 107 meq/L (ref 98–109)
CO2: 31 mEq/L — ABNORMAL HIGH (ref 22–29)
Calcium: 9.4 mg/dL (ref 8.4–10.4)
Creatinine: 0.8 mg/dL (ref 0.6–1.1)
EGFR: 68 mL/min/{1.73_m2} — ABNORMAL LOW (ref >90)
GLUCOSE: 91 mg/dL (ref 70–140)
Potassium: 4.7 mEq/L (ref 3.5–5.1)
SODIUM: 143 meq/L (ref 136–145)
Total Bilirubin: 0.49 mg/dL (ref 0.20–1.20)
Total Protein: 7 g/dL (ref 6.4–8.3)

## 2015-02-10 MED ORDER — DENOSUMAB 120 MG/1.7ML ~~LOC~~ SOLN
120.0000 mg | Freq: Once | SUBCUTANEOUS | Status: AC
  Administered 2015-02-10: 120 mg via SUBCUTANEOUS
  Filled 2015-02-10: qty 1.7

## 2015-02-15 ENCOUNTER — Telehealth: Payer: Self-pay | Admitting: *Deleted

## 2015-02-15 DIAGNOSIS — C50011 Malignant neoplasm of nipple and areola, right female breast: Secondary | ICD-10-CM

## 2015-02-15 MED ORDER — ANASTROZOLE 1 MG PO TABS: 1.0000 mg | ORAL_TABLET | Freq: Every day | ORAL | Status: DC

## 2015-02-15 NOTE — Telephone Encounter (Signed)
VM message from pt's husband requesting refill on anastrozole. This was escribed to their pharmacy today.  TC to pt/husband to inform them of this

## 2015-02-16 ENCOUNTER — Encounter: Payer: Self-pay | Admitting: Family Medicine

## 2015-02-16 ENCOUNTER — Telehealth: Payer: Self-pay | Admitting: Internal Medicine

## 2015-02-16 ENCOUNTER — Ambulatory Visit (INDEPENDENT_AMBULATORY_CARE_PROVIDER_SITE_OTHER): Payer: Medicare Other | Admitting: Family Medicine

## 2015-02-16 VITALS — BP 138/85 | HR 81 | Temp 98.5°F | Ht 62.0 in | Wt 135.0 lb

## 2015-02-16 DIAGNOSIS — C7951 Secondary malignant neoplasm of bone: Secondary | ICD-10-CM

## 2015-02-16 DIAGNOSIS — C50911 Malignant neoplasm of unspecified site of right female breast: Secondary | ICD-10-CM

## 2015-02-16 DIAGNOSIS — R1033 Periumbilical pain: Secondary | ICD-10-CM

## 2015-02-16 LAB — POCT URINALYSIS DIPSTICK
Bilirubin, UA: NEGATIVE
GLUCOSE UA: NEGATIVE
Ketones, UA: NEGATIVE
Leukocytes, UA: NEGATIVE
Nitrite, UA: NEGATIVE
PROTEIN UA: NEGATIVE
Spec Grav, UA: 1.01
UROBILINOGEN UA: 0.2
pH, UA: 6

## 2015-02-16 MED ORDER — OMEPRAZOLE 40 MG PO CPDR: 40.0000 mg | DELAYED_RELEASE_CAPSULE | Freq: Two times a day (BID) | ORAL | Status: DC

## 2015-02-16 NOTE — Telephone Encounter (Signed)
Patient Name: Erica Young DOB: 1940-05-22 Initial Comment Caller states having pain in lower pelvic area, having blood in stool, legs hurting Nurse Assessment Nurse: Vallery Sa, RN, Cathy Date/Time (Eastern Time): 02/16/2015 9:26:09 AM Confirm and document reason for call. If symptomatic, describe symptoms. ---Caller states she developed blood in her stool and lower abdominal pain 2 days ago. She developed pain in her left, lower leg. No fever. No chest pain. Feels like she can't take a deep breath in. No severe breathing difficulty. Has the patient traveled out of the country within the last 30 days? ---No Does the patient have any new or worsening symptoms? ---Yes Will a triage be completed? ---Yes Related visit to physician within the last 2 weeks? ---Yes Does the PT have any chronic conditions? (i.e. diabetes, asthma, etc.) ---Yes List chronic conditions. ---Widespread Cancer Guidelines Guideline Title Affirmed Question Affirmed Notes Breathing Difficulty [1] MODERATE difficulty breathing (e.g., speaks in phrases, SOB even at rest, pulse 100-120) AND [2] NEW-onset or WORSE than normal Final Disposition User Go to ED Now Vallery Sa, RN, Cathy Comments Caller declined the Go to ER disposition. Reinforced the Go to ER disposition. She requests to see her MD or get further direction from MD. Hulen Skains the office backline and notified Seth Bake. Referrals GO TO FACILITY REFUSED Disagree/Comply: Disagree Disagree/Comply Reason: Disagree with instructions

## 2015-02-16 NOTE — Progress Notes (Signed)
   Subjective:    Patient ID: Erica Young, female    DOB: Mar 06, 1941, 74 y.o.   MRN: [id number]  HPI Here for 2 days of epigastric and central abdominal pains that come and go. She describes these as sharp and they may radiate through to the back. No lower abdominal or pelvic pains. No nausea or vomiting. No fever. Her appetite is decreased, but this is to be expected with her ongoing radiation therapy. She has breast cancer with bony metastases. Her last abdominal and pelvic CT scan was in February and this showed no metastases. She had complete labwork including a CBC, metabolic panel, and hepatic panel on 02-10-15, and these were unremarkable. She has been a little constipated and has taken some Dulculax. Her last BM was last night. This was small but formed, and it was covered in a pink colored mucus. No actual blood as been seen from the rectum. Her stool are not painful to pass. No urinary symptoms. She has had diverticulitis in the past, and she says her current symptoms are totally different than they were then. She is S/P a cholecystectomy.    Review of Systems  Constitutional: Negative.   Respiratory: Negative.   Cardiovascular: Negative.   Gastrointestinal: Positive for abdominal pain and constipation. Negative for nausea, vomiting, diarrhea, blood in stool, abdominal distention, anal bleeding and rectal pain.  Endocrine: Negative.   Genitourinary: Negative.   Neurological: Negative.        Objective:   Physical Exam  Constitutional: She is oriented to person, place, and time. She appears well-developed and well-nourished. No distress.  Eyes: Conjunctivae are normal. No scleral icterus.  Neck: No thyromegaly present.  Cardiovascular: Normal rate, regular rhythm, normal heart sounds and intact distal pulses.   Pulmonary/Chest: Effort normal and breath sounds normal.  Abdominal: Soft. Bowel sounds are normal. She exhibits no distension and no mass. There is no rebound and no  guarding.  Mildly tender in the epigastrium and over the umbilicus   Lymphadenopathy:    She has no cervical adenopathy.  Neurological: She is alert and oriented to person, place, and time.          Assessment & Plan:  Epigastric pain which sounds like gastritis or duodenitis. Treat with Omeprazole 40 mg bid. Dr. Julien Nordmann had planned to get another abdominal and pelvic CT in December, but we will go ahead and set this up now. She will follow up if anything changes.

## 2015-02-16 NOTE — Progress Notes (Signed)
Pre visit review using our clinic review tool, if applicable. No additional management support is needed unless otherwise documented below in the visit note. 

## 2015-02-16 NOTE — Addendum Note (Signed)
Addended by: Aggie Hacker A on: 02/16/2015 01:20 PM   Modules accepted: Orders

## 2015-02-16 NOTE — Telephone Encounter (Signed)
Spoke with Dr.K and he advised patient needed to be seen today. Appointment scheduled for 11:15 with Dr. Sarajane Jews. Patient is aware to arrive at 11:00.

## 2015-02-25 ENCOUNTER — Ambulatory Visit (INDEPENDENT_AMBULATORY_CARE_PROVIDER_SITE_OTHER)
Admission: RE | Admit: 2015-02-25 | Discharge: 2015-02-25 | Disposition: A | Discharge status: Home / Self Care | Payer: Medicare Other | Source: Ambulatory Visit | Attending: Family Medicine | Admitting: Family Medicine

## 2015-02-25 DIAGNOSIS — R109 Unspecified abdominal pain: Secondary | ICD-10-CM | POA: Diagnosis not present

## 2015-02-25 DIAGNOSIS — R1033 Periumbilical pain: Secondary | ICD-10-CM

## 2015-02-25 DIAGNOSIS — R630 Anorexia: Secondary | ICD-10-CM | POA: Diagnosis not present

## 2015-02-25 MED ORDER — IOHEXOL 300 MG/ML  SOLN
100.0000 mL | Freq: Once | INTRAMUSCULAR | Status: AC | PRN
  Administered 2015-02-25: 100 mL via INTRAVENOUS

## 2015-02-28 ENCOUNTER — Encounter: Payer: Self-pay | Admitting: Internal Medicine

## 2015-02-28 ENCOUNTER — Ambulatory Visit (INDEPENDENT_AMBULATORY_CARE_PROVIDER_SITE_OTHER): Payer: Medicare Other | Admitting: Internal Medicine

## 2015-02-28 VITALS — BP 140/80 | HR 82 | Temp 97.5°F | Resp 18 | Ht 62.0 in | Wt 129.0 lb

## 2015-02-28 DIAGNOSIS — I10 Essential (primary) hypertension: Secondary | ICD-10-CM | POA: Diagnosis not present

## 2015-02-28 DIAGNOSIS — E039 Hypothyroidism, unspecified: Secondary | ICD-10-CM

## 2015-02-28 DIAGNOSIS — C50911 Malignant neoplasm of unspecified site of right female breast: Secondary | ICD-10-CM

## 2015-02-28 DIAGNOSIS — Z23 Encounter for immunization: Secondary | ICD-10-CM | POA: Diagnosis not present

## 2015-02-28 LAB — TSH: TSH: 3.98 u[IU]/mL (ref 0.35–4.50)

## 2015-02-28 MED ORDER — OMEPRAZOLE 40 MG PO CPDR: 40.0000 mg | DELAYED_RELEASE_CAPSULE | Freq: Every day | ORAL | Status: DC

## 2015-02-28 NOTE — Progress Notes (Signed)
Pre visit review using our clinic review tool, if applicable. No additional management support is needed unless otherwise documented below in the visit note. 

## 2015-02-28 NOTE — Patient Instructions (Signed)
Avoids foods high in acid such as tomatoes citrus juices, and spicy foods.  Avoid eating within two hours of lying down or before exercising.  Do not overheat.  Try smaller more frequent meals.   Food Choices for Gastroesophageal Reflux Disease, Adult When you have gastroesophageal reflux disease (GERD), the foods you eat and your eating habits are very important. Choosing the right foods can help ease the discomfort of GERD. WHAT GENERAL GUIDELINES DO I NEED TO FOLLOW?  Choose fruits, vegetables, whole grains, low-fat dairy products, and low-fat meat, fish, and poultry.  Limit fats such as oils, salad dressings, butter, nuts, and avocado.  Keep a food diary to identify foods that cause symptoms.  Avoid foods that cause reflux. These may be different for different people.  Eat frequent small meals instead of three large meals each day.  Eat your meals slowly, in a relaxed setting.  Limit fried foods.  Cook foods using methods other than frying.  Avoid drinking alcohol.  Avoid drinking large amounts of liquids with your meals.  Avoid bending over or lying down until 2-3 hours after eating. WHAT FOODS ARE NOT RECOMMENDED? The following are some foods and drinks that may worsen your symptoms: Vegetables Tomatoes. Tomato juice. Tomato and spaghetti sauce. Chili peppers. Onion and garlic. Horseradish. Fruits Oranges, grapefruit, and lemon (fruit and juice). Meats High-fat meats, fish, and poultry. This includes hot dogs, ribs, ham, sausage, salami, and bacon. Dairy Whole milk and chocolate milk. Sour cream. Cream. Butter. Ice cream. Cream cheese.  Beverages Coffee and tea, with or without caffeine. Carbonated beverages or energy drinks. Condiments Hot sauce. Barbecue sauce.  Sweets/Desserts Chocolate and cocoa. Donuts. Peppermint and spearmint. Fats and Oils High-fat foods, including [place] fries and potato chips. Other Vinegar. Strong spices, such as black pepper, white  pepper, red pepper, cayenne, curry powder, cloves, ginger, and chili powder. The items listed above may not be a complete list of foods and beverages to avoid. Contact your dietitian for more information.   This information is not intended to replace advice given to you by your health care provider. Make sure you discuss any questions you have with your health care provider.   Document Released: 04/16/2005 Document Revised: 05/07/2014 Document Reviewed: 02/18/2013 Elsevier Interactive Patient Education [employer].

## 2015-02-28 NOTE — Progress Notes (Signed)
Subjective:    Patient ID: Erica Young, female    DOB: October 20, 1940, 74 y.o.   MRN: [id number]  HPI  74 year old patient who is followed closely by oncology with stage IV cancer of the right breast.  She is seen today for her six-month follow-up.  She was seen last month with epigastric pain.  CT scan of the abdomen pelvis revealed stable findings.  She has been on omeprazole and her symptoms are largely resolved.  She has been adhering to a bland diet.  She states she does have a remote history of peptic ulcer disease.  Symptoms were associated with excessive belching.  No recent nausea or vomiting  She has a prior history of hypothyroidism but TSH has been normal off  Levothyroxin supplements.  Wt Readings from Last 3 Encounters:  02/28/15 129 lb (58.514 kg)  02/16/15 135 lb (61.236 kg)  01/27/15 135 lb 1.6 oz (61.281 kg)    Past Medical History  Diagnosis Date  . HYPERLIPIDEMIA 03/03/2010  . CEREBROVASCULAR DISEASE 03/03/2010  . DIVERTICULOSIS, COLON 03/03/2010  . Low back pain   . Right leg pain   . History of cerebral artery stenosis     right middle  . Mastoiditis     noted on brain MRI  . Foramen ovale     positive bubble study  . Diverticulitis   . Fatty liver 08/02/09    as per U/S done by [facility]  . Internal hemorrhoid   . Anxiety   . Hypertension   . Ulcer   . Stroke [facility]) Oct. 2011    TIA  . HYPOTHYROIDISM 03/03/2010    no longer on meds  . Shortness of breath   . Headache(784.0)     she thinks sinus headaches  . Rib fracture   . Cancer [facility]) 2007    right breat ca  , lumpectomy and radiation tx (declined chemo and additional prophylactic meds)  . Bone cancer ([place]) mri 11/11/14    right parietal bone,left cribriform plate metastases  . Allergy     Social History   Social History  . Marital Status: Married    Spouse Name: N/A  . Number of Children: 2  . Years of Education: N/A   Occupational History  . Social Worker--Retired    Social  History Main Topics  . Smoking status: Former Smoker -- 0.50 packs/day for 20 years    Types: Cigarettes    Quit date: 05/01/1991  . Smokeless tobacco: Never Used  . Alcohol Use: No  . Drug Use: No  . Sexual Activity: Yes   Other Topics Concern  . Not on file   Social History Narrative   Daily caffeine     Past Surgical History  Procedure Laterality Date  . Cholecystectomy    . Breast lumpectomy      right  . Tonsillectomy    . Colonoscopy    . Tubal ligation    . Shoulder surgery      right shoulder (dislocation)  . Video bronchoscopy with endobronchial ultrasound N/A 01/07/2014    Procedure: VIDEO BRONCHOSCOPY WITH ENDOBRONCHIAL ULTRASOUND;  Surgeon: Ivin Poot, MD;  Location: [facility] OR;  Service: Thoracic;  Laterality: N/A;    Family History  Problem Relation Age of Onset  . Heart disease Sister     cerebral vascular disease also  . Heart disease Brother     cerebral vascular disease also  . Heart disease Brother     cerebral vascular disease  .  Heart disease Sister     cebreal vascular disease also  . Heart disease Sister     cebreal vascular diease also  . Heart disease Sister     cerebral vascular diease also  . Colon cancer Neg Hx   . Esophageal cancer Neg Hx   . Rectal cancer Neg Hx   . Stomach cancer Father     Allergies  Allergen Reactions  . Ciprofloxacin Anaphylaxis    Current Outpatient Prescriptions on File Prior to Visit  Medication Sig Dispense Refill  . ALPRAZolam (XANAX) 0.25 MG tablet TAKE 1 TABLET BY MOUTH THREE TIMES DAILY AS NEEDED FOR ANXIETY 90 tablet 2  . anastrozole (ARIMIDEX) 1 MG tablet Take 1 tablet (1 mg total) by mouth daily. 30 tablet 3  . aspirin EC 81 MG tablet Take 81 mg by mouth daily.    . Calcium-Vitamin D (CALTRATE 600 PLUS-VIT D PO) Take 1 tablet by mouth 2 (two) times daily.     . cetirizine (ZYRTEC) 10 MG tablet Take 10 mg by mouth daily as needed.     . Cyanocobalamin (VITAMIN B 12 PO) Take 100 mcg by mouth  daily.    Marland Kitchen denosumab (XGEVA) 120 MG/1.7ML SOLN injection Inject 120 mg into the skin every 30 (thirty) days.    Marland Kitchen diltiazem (CARDIZEM CD) 120 MG 24 hr capsule Take 1 capsule (120 mg total) by mouth daily. 90 capsule 4  . meclizine (ANTIVERT) 25 MG tablet Take 1 tablet (25 mg total) by mouth 3 (three) times daily as needed for dizziness. 30 tablet 2  . Melatonin 10 MG TABS Take 1 tablet by mouth daily.    . Multiple Vitamin (MULTIVITAMIN WITH MINERALS) TABS tablet Take 1 tablet by mouth daily.    Marland Kitchen omeprazole (PRILOSEC) 40 MG capsule Take 1 capsule (40 mg total) by mouth 2 (two) times daily. 60 capsule 0  . Probiotic Product (PROBIOTIC DAILY PO) Take 1 capsule by mouth once. Takes 1 a day    . simvastatin (ZOCOR) 5 MG tablet Take 1 tablet (5 mg total) by mouth daily. 90 tablet 2   No current facility-administered medications on file prior to visit.    BP 140/80 mmHg  Pulse 82  Temp(Src) 97.5 F (36.4 C) (Oral)  Resp 18  Ht '5\' 2"'$  (1.575 m)  Wt 129 lb (58.514 kg)  BMI 23.59 kg/m2  SpO2 95%     Review of Systems  Constitutional: Negative.   HENT: Negative for congestion, dental problem, hearing loss, rhinorrhea, sinus pressure, sore throat and tinnitus.   Eyes: Negative for pain, discharge and visual disturbance.  Respiratory: Negative for cough and shortness of breath.   Cardiovascular: Negative for chest pain, palpitations and leg swelling.  Gastrointestinal: Positive for abdominal pain. Negative for nausea, vomiting, diarrhea, constipation, blood in stool and abdominal distention.  Genitourinary: Negative for dysuria, urgency, frequency, hematuria, flank pain, vaginal bleeding, vaginal discharge, difficulty urinating, vaginal pain and pelvic pain.  Musculoskeletal: Negative for joint swelling, arthralgias and gait problem.  Skin: Negative for rash.  Neurological: Negative for dizziness, syncope, speech difficulty, weakness, numbness and headaches.  Hematological: Negative for  adenopathy.  Psychiatric/Behavioral: Negative for behavioral problems, dysphoric mood and agitation. The patient is not nervous/anxious.        Objective:   Physical Exam  Constitutional: She is oriented to person, place, and time. She appears well-developed and well-nourished.  HENT:  Head: Normocephalic.  Right Ear: External ear normal.  Left Ear: External ear normal.  Mouth/Throat:  Oropharynx is clear and moist.  Eyes: Conjunctivae and EOM are normal. Pupils are equal, round, and reactive to light.  Neck: Normal range of motion. Neck supple. No thyromegaly present.  Cardiovascular: Normal rate, regular rhythm, normal heart sounds and intact distal pulses.   Pulmonary/Chest: Effort normal and breath sounds normal.  Abdominal: Soft. Bowel sounds are normal. She exhibits no distension and no mass. There is tenderness. There is no rebound and no guarding.  Very mild epigastric tenderness  Musculoskeletal: Normal range of motion.  Lymphadenopathy:    She has no cervical adenopathy.  Neurological: She is alert and oriented to person, place, and time.  Skin: Skin is warm and dry. No rash noted.  Psychiatric: She has a normal mood and affect. Her behavior is normal.          Assessment & Plan:   Epigastric pain.  Larger resolved.  Unremarkable abdominal pelvic CT scan.  Continue bland diet and omeprazole Stage IV breast cancer.  Follow-up oncology next month as scheduled Hypertension, stable Dyslipidemia.  Continue statin therapy  Recheck here 6 months or as needed

## 2015-03-03 ENCOUNTER — Ambulatory Visit: Payer: Medicare Other | Admitting: Internal Medicine

## 2015-03-08 ENCOUNTER — Telehealth: Payer: Self-pay | Admitting: Internal Medicine

## 2015-03-08 NOTE — Telephone Encounter (Signed)
Pt would like blood work results °

## 2015-03-08 NOTE — Telephone Encounter (Signed)
Left message on voicemail to call office.  

## 2015-03-09 NOTE — Telephone Encounter (Signed)
Spoke to pt, told her TSH was in normal range. Pt verbalized understanding.

## 2015-03-10 ENCOUNTER — Ambulatory Visit (HOSPITAL_BASED_OUTPATIENT_CLINIC_OR_DEPARTMENT_OTHER): Payer: Medicare Other

## 2015-03-10 ENCOUNTER — Other Ambulatory Visit (HOSPITAL_BASED_OUTPATIENT_CLINIC_OR_DEPARTMENT_OTHER): Payer: Medicare Other

## 2015-03-10 VITALS — BP 150/78 | HR 70 | Temp 98.1°F

## 2015-03-10 DIAGNOSIS — C7951 Secondary malignant neoplasm of bone: Secondary | ICD-10-CM | POA: Diagnosis not present

## 2015-03-10 DIAGNOSIS — C50911 Malignant neoplasm of unspecified site of right female breast: Secondary | ICD-10-CM

## 2015-03-10 DIAGNOSIS — Z853 Personal history of malignant neoplasm of breast: Secondary | ICD-10-CM

## 2015-03-10 LAB — COMPREHENSIVE METABOLIC PANEL (CC13)
ALBUMIN: 3.5 g/dL (ref 3.5–5.0)
ALK PHOS: 72 U/L (ref 40–150)
ALT: 30 U/L (ref 0–55)
AST: 26 U/L (ref 5–34)
Anion Gap: 9 mEq/L (ref 3–11)
BUN: 14.8 mg/dL (ref 7.0–26.0)
CALCIUM: 8.9 mg/dL (ref 8.4–10.4)
CHLORIDE: 106 meq/L (ref 98–109)
CO2: 25 mEq/L (ref 22–29)
Creatinine: 0.8 mg/dL (ref 0.6–1.1)
EGFR: 73 mL/min/{1.73_m2} — AB (ref >90)
Glucose: 108 mg/dl (ref 70–140)
Potassium: 4.4 mEq/L (ref 3.5–5.1)
SODIUM: 139 meq/L (ref 136–145)
Total Bilirubin: 0.34 mg/dL (ref 0.20–1.20)
Total Protein: 6.5 g/dL (ref 6.4–8.3)

## 2015-03-10 MED ORDER — DENOSUMAB 120 MG/1.7ML ~~LOC~~ SOLN
120.0000 mg | Freq: Once | SUBCUTANEOUS | Status: AC
Start: 2015-03-10 — End: 2015-03-10
  Administered 2015-03-10: 120 mg via SUBCUTANEOUS
  Filled 2015-03-10: qty 1.7

## 2015-03-14 ENCOUNTER — Telehealth: Payer: Self-pay | Admitting: *Deleted

## 2015-03-14 NOTE — Telephone Encounter (Signed)
Pt called to [place] states she will be having CT's  in December- " but it does not include my head "- "at my last visit with Dr Lisbeth Renshaw- he stated he was going to talk to Dr Julien Nordmann and get my head included in scans"  Amorita states " when I was called about my scan appointment there is not an order for my head "  Pt is wanting to have head included in scans scheduled for December prior to MD follow up.  Return call number given as [phone].  This note will be sent to MD and nurse for appropriate follow up.

## 2015-03-16 ENCOUNTER — Telehealth: Payer: Self-pay | Admitting: *Deleted

## 2015-03-16 ENCOUNTER — Other Ambulatory Visit: Payer: Self-pay | Admitting: Radiation Oncology

## 2015-03-16 DIAGNOSIS — C7951 Secondary malignant neoplasm of bone: Secondary | ICD-10-CM

## 2015-03-16 NOTE — Telephone Encounter (Signed)
Called pt, who advised order has been put in by Dr. Lisbeth Renshaw. No further concerns at this time.

## 2015-03-16 NOTE — Telephone Encounter (Signed)
CALLED PATIENT TO INFORM THAT CT HEAD HAS BEEN ADDED TO SCAN ON 04-11-15 @ [facility], SPOKE WITH HER HUSBAND ROBERT AND HE IS AWARE THAT THIS HAS BEEN ADDED

## 2015-04-03 ENCOUNTER — Other Ambulatory Visit: Payer: Self-pay | Admitting: Internal Medicine

## 2015-04-04 ENCOUNTER — Telehealth: Payer: Self-pay | Admitting: Internal Medicine

## 2015-04-04 MED ORDER — SCOPOLAMINE 1 MG/3DAYS TD PT72: 1.0000 | MEDICATED_PATCH | TRANSDERMAL | Status: DC

## 2015-04-04 MED ORDER — MECLIZINE HCL 25 MG PO TABS: 25.0000 mg | ORAL_TABLET | Freq: Four times a day (QID) | ORAL | Status: DC | PRN

## 2015-04-04 NOTE — Telephone Encounter (Signed)
Patient would like for the CMA to call her about two vertigo episodes. Please call [phone]

## 2015-04-04 NOTE — Telephone Encounter (Signed)
Spoke to pt, c/o vertigo, had 2 episodes this past week and are becoming more severe. Pt would like a refill on Meclizine and also a patch for vertigo. Told pt will send message to Dr. Raliegh Ip and get back to her. Pt verbalized understanding.

## 2015-04-04 NOTE — Telephone Encounter (Signed)
Spoke to pt, told her Rx's for Meclizine and Transderm scop patch was sent to pharmacy. Pt verbalized understanding. Told pt if she does not get better need to make an appt/ PT verbalized understanding.

## 2015-04-04 NOTE — Telephone Encounter (Signed)
Please see message and advise 

## 2015-04-04 NOTE — Telephone Encounter (Signed)
Meclizine 25 mg  #60 one every 6 hours when necessary vertigo  , refill times 3  Generic Transderm-Scop  #6.  Use every 72 hours as needed for vertigo or motion sickness refill times 3

## 2015-04-07 ENCOUNTER — Ambulatory Visit: Payer: Medicare Other | Admitting: Internal Medicine

## 2015-04-07 ENCOUNTER — Other Ambulatory Visit: Payer: Medicare Other

## 2015-04-07 ENCOUNTER — Ambulatory Visit: Payer: Medicare Other

## 2015-04-08 ENCOUNTER — Encounter (HOSPITAL_COMMUNITY): Payer: Medicare Other

## 2015-04-11 ENCOUNTER — Ambulatory Visit (HOSPITAL_COMMUNITY)
Admission: RE | Admit: 2015-04-11 | Discharge: 2015-04-11 | Disposition: A | Discharge status: Home / Self Care | Payer: Medicare Other | Source: Ambulatory Visit | Attending: Internal Medicine | Admitting: Internal Medicine

## 2015-04-11 ENCOUNTER — Other Ambulatory Visit: Payer: Self-pay | Admitting: Radiation Oncology

## 2015-04-11 ENCOUNTER — Encounter (HOSPITAL_COMMUNITY): Payer: Self-pay

## 2015-04-11 ENCOUNTER — Telehealth: Payer: Self-pay | Admitting: Medical Oncology

## 2015-04-11 DIAGNOSIS — C7951 Secondary malignant neoplasm of bone: Secondary | ICD-10-CM

## 2015-04-11 DIAGNOSIS — C50911 Malignant neoplasm of unspecified site of right female breast: Secondary | ICD-10-CM

## 2015-04-11 DIAGNOSIS — C50919 Malignant neoplasm of unspecified site of unspecified female breast: Secondary | ICD-10-CM | POA: Insufficient documentation

## 2015-04-11 DIAGNOSIS — R918 Other nonspecific abnormal finding of lung field: Secondary | ICD-10-CM | POA: Insufficient documentation

## 2015-04-11 DIAGNOSIS — I2510 Atherosclerotic heart disease of native coronary artery without angina pectoris: Secondary | ICD-10-CM | POA: Insufficient documentation

## 2015-04-11 DIAGNOSIS — Z923 Personal history of irradiation: Secondary | ICD-10-CM | POA: Diagnosis not present

## 2015-04-11 MED ORDER — IOHEXOL 300 MG/ML  SOLN
100.0000 mL | Freq: Once | INTRAMUSCULAR | Status: AC | PRN
  Administered 2015-04-11: 100 mL via INTRAVENOUS

## 2015-04-11 NOTE — Telephone Encounter (Signed)
I told Erica Young that it is okay to schedule bone scan for later date.

## 2015-04-13 ENCOUNTER — Other Ambulatory Visit (HOSPITAL_BASED_OUTPATIENT_CLINIC_OR_DEPARTMENT_OTHER): Payer: Medicare Other

## 2015-04-13 ENCOUNTER — Ambulatory Visit (HOSPITAL_BASED_OUTPATIENT_CLINIC_OR_DEPARTMENT_OTHER): Payer: Medicare Other

## 2015-04-13 ENCOUNTER — Encounter: Payer: Self-pay | Admitting: Internal Medicine

## 2015-04-13 ENCOUNTER — Ambulatory Visit (HOSPITAL_BASED_OUTPATIENT_CLINIC_OR_DEPARTMENT_OTHER): Payer: Medicare Other | Admitting: Internal Medicine

## 2015-04-13 VITALS — BP 138/72 | HR 85 | Temp 97.8°F | Resp 20 | Ht 62.0 in | Wt 132.8 lb

## 2015-04-13 DIAGNOSIS — C7951 Secondary malignant neoplasm of bone: Secondary | ICD-10-CM

## 2015-04-13 DIAGNOSIS — C50911 Malignant neoplasm of unspecified site of right female breast: Secondary | ICD-10-CM

## 2015-04-13 DIAGNOSIS — C50919 Malignant neoplasm of unspecified site of unspecified female breast: Secondary | ICD-10-CM

## 2015-04-13 DIAGNOSIS — Z853 Personal history of malignant neoplasm of breast: Secondary | ICD-10-CM

## 2015-04-13 LAB — COMPREHENSIVE METABOLIC PANEL
ALBUMIN: 3.7 g/dL (ref 3.5–5.0)
ALK PHOS: 55 U/L (ref 40–150)
ALT: 17 U/L (ref 0–55)
ANION GAP: 10 meq/L (ref 3–11)
AST: 19 U/L (ref 5–34)
BUN: 16.9 mg/dL (ref 7.0–26.0)
CALCIUM: 9.2 mg/dL (ref 8.4–10.4)
CHLORIDE: 104 meq/L (ref 98–109)
CO2: 27 mEq/L (ref 22–29)
Creatinine: 1 mg/dL (ref 0.6–1.1)
EGFR: 58 mL/min/{1.73_m2} — AB (ref >90)
Glucose: 147 mg/dl — ABNORMAL HIGH (ref 70–140)
POTASSIUM: 4.3 meq/L (ref 3.5–5.1)
Sodium: 141 mEq/L (ref 136–145)
Total Bilirubin: 0.56 mg/dL (ref 0.20–1.20)
Total Protein: 6.8 g/dL (ref 6.4–8.3)

## 2015-04-13 LAB — CBC WITH DIFFERENTIAL/PLATELET
BASO%: 0.4 % (ref 0.0–2.0)
BASOS ABS: 0 10*3/uL (ref 0.0–0.1)
EOS%: 5.3 % (ref 0.0–7.0)
Eosinophils Absolute: 0.3 10*3/uL (ref 0.0–0.5)
HEMATOCRIT: 41.9 % (ref 34.8–46.6)
HEMOGLOBIN: 13.9 g/dL (ref 11.6–15.9)
LYMPH#: 1.6 10*3/uL (ref 0.9–3.3)
LYMPH%: 30.6 % (ref 14.0–49.7)
MCH: 32.3 pg (ref 25.1–34.0)
MCHC: 33.2 g/dL (ref 31.5–36.0)
MCV: 97.2 fL (ref 79.5–101.0)
MONO#: 0.3 10*3/uL (ref 0.1–0.9)
MONO%: 5.1 % (ref 0.0–14.0)
NEUT#: 3 10*3/uL (ref 1.5–6.5)
NEUT%: 58.6 % (ref 38.4–76.8)
Platelets: 148 10*3/uL (ref 145–400)
RBC: 4.31 10*6/uL (ref 3.70–5.45)
RDW: 12.5 % (ref 11.2–14.5)
WBC: 5.1 10*3/uL (ref 3.9–10.3)

## 2015-04-13 LAB — CANCER ANTIGEN 27.29: CA 27.29: 22 U/mL (ref 0–39)

## 2015-04-13 MED ORDER — DENOSUMAB 120 MG/1.7ML ~~LOC~~ SOLN
120.0000 mg | Freq: Once | SUBCUTANEOUS | Status: AC
  Administered 2015-04-13: 120 mg via SUBCUTANEOUS
  Filled 2015-04-13: qty 1.7

## 2015-04-13 NOTE — Progress Notes (Signed)
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 The patient has a history of right breast carcinoma status post lumpectomy followed by radiotherapy in 2007 diagnosed in [place] and the patient declined any further treatment at that time.   PRIOR THERAPY: None  CURRENT THERAPY:  1) Arimidex 1 mg by mouth daily. Started 01/22/2014. 2) Xgeva 120 g subcutaneously monthly.  INTERVAL HISTORY: Erica Young 74 y.o. female returns to the clinic today for followup visit accompanied by her husband. The patient has no complaints today. She denied having any significant weight loss or night sweats. She has no nausea or vomiting. The patient denied having any significant fever or chills. She denied having any significant adverse effects from the treatment with Arimidex. She is also on treatment with Xgeva for the metastatic bone disease. She had repeat CBC, comprehensive metabolic panel and CA 42.35 as well as CT scan of the head, chest, abdomen and pelvis performed recently and she is here for evaluation and discussion of her lab results. She was also scheduled for a bone scan but she missed her appointment.  MEDICAL HISTORY: Past Medical History  Diagnosis Date  . HYPERLIPIDEMIA 03/03/2010  . CEREBROVASCULAR DISEASE 03/03/2010  . DIVERTICULOSIS, COLON 03/03/2010  . Low back pain   . Right leg pain   . History of cerebral artery stenosis     right middle  . Mastoiditis     noted on brain MRI  . Foramen ovale     positive bubble study  . Diverticulitis   . Fatty liver 08/02/09    as per U/S done by [facility]  . Internal hemorrhoid   . Anxiety   . Hypertension   . Ulcer   . Stroke [facility]) Oct. 2011    TIA  . HYPOTHYROIDISM 03/03/2010    no longer  on meds  . Shortness of breath   . Headache(784.0)     she thinks sinus headaches  . Rib fracture   . Cancer [facility]) 2007    right breat ca  , lumpectomy and radiation tx (declined chemo and additional prophylactic meds)  . Bone cancer ([place]) mri 11/11/14    right parietal bone,left cribriform plate metastases  . Allergy     ALLERGIES:  is allergic to ciprofloxacin.  MEDICATIONS:  Current Outpatient Prescriptions  Medication Sig Dispense Refill  . ALPRAZolam (XANAX) 0.25 MG tablet TAKE 1 TABLET BY MOUTH THREE TIMES DAILY AS NEEDED FOR ANXIETY 90 tablet 2  . anastrozole (ARIMIDEX) 1 MG tablet Take 1 tablet (1 mg total) by mouth daily. 30 tablet 3  . aspirin EC 81 MG tablet Take 81 mg by mouth daily.    . Calcium-Vitamin D (CALTRATE 600 PLUS-VIT D PO) Take 1 tablet by mouth 2 (two) times daily.     . cetirizine (ZYRTEC) 10 MG tablet Take 10 mg by mouth daily as needed.     . Cyanocobalamin (VITAMIN B 12 PO) Take 100 mcg by mouth daily.    Marland Kitchen denosumab (XGEVA) 120 MG/1.7ML SOLN injection Inject 120 mg into the skin every 30 (thirty) days.    Marland Kitchen diltiazem (CARDIZEM CD) 120 MG 24 hr capsule Take 1 capsule (120 mg total) by mouth daily. 90 capsule 4  . meclizine (ANTIVERT) 25 MG tablet Take 1 tablet (25 mg  total) by mouth every 6 (six) hours as needed for dizziness. 60 tablet 3  . Melatonin 10 MG TABS Take 1 tablet by mouth daily.    . Multiple Vitamin (MULTIVITAMIN WITH MINERALS) TABS tablet Take 1 tablet by mouth daily.    . Probiotic Product (PROBIOTIC DAILY PO) Take 1 capsule by mouth once. Takes 1 a day    . simvastatin (ZOCOR) 5 MG tablet Take 1 tablet (5 mg total) by mouth daily. 90 tablet 2  . omeprazole (PRILOSEC) 40 MG capsule Take 1 capsule (40 mg total) by mouth daily. (Patient not taking: Reported on 04/13/2015) 60 capsule 0   No current facility-administered medications for this visit.    SURGICAL HISTORY:  Past Surgical History  Procedure Laterality Date  . Cholecystectomy      . Breast lumpectomy      right  . Tonsillectomy    . Colonoscopy    . Tubal ligation    . Shoulder surgery      right shoulder (dislocation)  . Video bronchoscopy with endobronchial ultrasound N/A 01/07/2014    Procedure: VIDEO BRONCHOSCOPY WITH ENDOBRONCHIAL ULTRASOUND;  Surgeon: Ivin Poot, MD;  Location: [facility] OR;  Service: Thoracic;  Laterality: N/A;    REVIEW OF SYSTEMS:  Constitutional: negative Eyes: negative Ears, nose, mouth, throat, and face: negative Respiratory: negative Cardiovascular: negative Gastrointestinal: negative Genitourinary:negative Integument/breast: negative Hematologic/lymphatic: negative Musculoskeletal:negative Neurological: negative Behavioral/Psych: negative Endocrine: negative Allergic/Immunologic: negative   PHYSICAL EXAMINATION: General appearance: alert, cooperative and no distress Head: Normocephalic, without obvious abnormality, atraumatic Neck: no adenopathy, no JVD, supple, symmetrical, trachea midline and thyroid not enlarged, symmetric, no tenderness/mass/nodules Lymph nodes: Cervical, supraclavicular, and axillary nodes normal. Resp: clear to auscultation bilaterally Back: symmetric, no curvature. ROM normal. No CVA tenderness. Cardio: regular rate and rhythm, S1, S2 normal, no murmur, click, rub or gallop GI: soft, non-tender; bowel sounds normal; no masses,  no organomegaly Extremities: extremities normal, atraumatic, no cyanosis or edema Neurologic: Alert and oriented X 3, normal strength and tone. Normal symmetric reflexes. Normal coordination and gait  ECOG PERFORMANCE STATUS: 1 - Symptomatic but completely ambulatory  Blood pressure 138/72, pulse 85, temperature 97.8 F (36.6 C), temperature source Oral, resp. rate 20, height '5\' 2"'$  (1.575 m), weight 132 lb 12.8 oz (60.238 kg), SpO2 95 %.  LABORATORY DATA: Lab Results  Component Value Date   WBC 5.1 04/13/2015   HGB 13.9 04/13/2015   HCT 41.9 04/13/2015   MCV 97.2  04/13/2015   PLT 148 04/13/2015      Chemistry      Component Value Date/Time   NA 139 03/10/2015 0808   NA 138 01/06/2014 0910   K 4.4 03/10/2015 0808   K 4.3 01/06/2014 0910   CL 104 01/06/2014 0910   CO2 25 03/10/2015 0808   CO2 22 01/06/2014 0910   BUN 14.8 03/10/2015 0808   BUN 9 01/06/2014 0910   CREATININE 0.8 03/10/2015 0808   CREATININE 0.56 01/06/2014 0910      Component Value Date/Time   CALCIUM 8.9 03/10/2015 0808   CALCIUM 9.0 01/06/2014 0910   ALKPHOS 72 03/10/2015 0808   ALKPHOS 87 01/06/2014 0910   AST 26 03/10/2015 0808   AST 17 01/06/2014 0910   ALT 30 03/10/2015 0808   ALT 16 01/06/2014 0910   BILITOT 0.34 03/10/2015 0808   BILITOT 0.3 01/06/2014 0910       RADIOGRAPHIC STUDIES: Ct Head W Wo Contrast  04/11/2015  CLINICAL DATA:  Metastatic breast cancer to  the bone. Previous radiation. Follow-up. Staging. EXAM: CT HEAD WITHOUT AND WITH CONTRAST TECHNIQUE: Contiguous axial images were obtained from the base of the skull through the vertex without and with intravenous contrast CONTRAST:  119m OMNIPAQUE IOHEXOL 300 MG/ML  SOLN COMPARISON:  MRI 11/11/2014.  Bone scan 06/25/2014. FINDINGS: The brain itself has a normal appearance without evidence of old or acute infarction, intra-axial mass lesion, hemorrhage, hydrocephalus or extra-axial collection. No abnormal brain enhancement occurs. Previously seen metastatic lesion in the right parietal bone measures maximally 3.1 cm as seen previously. There is no evidence of any extraosseous tumor indenting the brain on this study, indicating favorable response to therapy when compared to the previous MRI. Previously seen metastatic lesion affecting the skullbase in the region of the cribriform plate, left more than right but with extension to both sites and extension downward into the ethmoid sinuses is again demonstrated. Maximal diameter again 2.7 cm. There is some internal calcification as might be seen following  treatment. Continued obstruction of the left frontal sinus with left frontal sinus opacification. Other sinuses are clear. IMPRESSION: No evidence of new or progressive disease. Right parietal calvarial metastasis with a region of bone involvement being no larger when compared to the previous MRI, 3.1 cm. Previously seen extraosseous component appears resolved. No mass effect upon the brain today. Metastasis centered in the cribriform plate region left more than right is no larger, again measuring 2.7 cm. Internal calcification which can be seen with treatment. No evidence of growth or intracranial extension today. Left frontal sinus obstruction because of this lesion. Electronically Signed   By: MNelson ChimesM.D.   On: 04/11/2015 09:49   Ct Chest W Contrast  04/11/2015  CLINICAL DATA:  Metastatic breast cancer. EXAM: CT CHEST, ABDOMEN, AND PELVIS WITH CONTRAST TECHNIQUE: Multidetector CT imaging of the chest, abdomen and pelvis was performed following the standard protocol during bolus administration of intravenous contrast. CONTRAST:  1022mOMNIPAQUE IOHEXOL 300 MG/ML  SOLN COMPARISON:  CT scan 06/25/2014 FINDINGS: CT CHEST FINDINGS Mediastinum/Lymph Nodes: Stable subareolar left breast lesion. Stable surgical clips in the right axilla. No axillary or supraclavicular lymphadenopathy is demonstrated. The thyroid gland is normal. The heart is normal in size. No pericardial effusion. The aorta is normal in caliber. No dissection. The branch vessels are patent. Stable scattered coronary artery calcifications. Slight interval decrease and mediastinal and hilar lymphadenopathy when compared to the prior study. 7 mm upper mediastinal lymph node on image 13 previously measured 10 mm. 8.5 mm lymph node adjacent to the trachea on image 18 previously measured 13.5 mm. 13.5 and 15 mm pretracheal lymph nodes on image number 22 previously measured 17 and 15.5 mm. 14 mm subcarinal lymph node on image 29 previously measured  18 mm. 10.0 mm right hilar lymph node on image number 29 previously measured 11.5 mm. 6 mm left hilar node on image 29 previously measured 10 mm. Right internal mammary lymph node measures 9 mm and previously measured 10 mm. The esophagus is grossly normal. Lungs/Pleura: The pulmonary nodules have decreased in size since the prior study. Index nodule in the left lower lobe measures 4 mm on image number 46 and previously measured 7 mm. 6 mm pulmonary nodule in the right lower lobe on image 31 previously measured 9 mm. No new pulmonary lesions. No acute overlying pulmonary findings. Right basilar scarring changes are slightly progressive. Musculoskeletal: Stable metastatic lesion involving the left fifth posterior rib. No other definite rib lesions. No obvious sternal or thoracic vertebral  body lesions. No spinal canal compromise. CT ABDOMEN PELVIS FINDINGS Hepatobiliary: No focal hepatic lesions to suggest hepatic metastasis. The gallbladder is surgically absent. No significant common bowel duct dilatation. Pancreas: No mass, inflammation or ductal dilatation. Spleen: Normal size.  No focal lesions. Adrenals/Urinary Tract: The adrenal glands and kidneys are unremarkable and stable. No findings for metastatic disease. Small renal cysts are noted. Stomach/Bowel: The stomach, duodenum, small bowel and colon are unremarkable. No inflammatory changes, mass lesions or obstructive findings. The terminal ileum is normal. The appendix is normal. Moderate sigmoid diverticulosis. Vascular/Lymphatic: Stable atherosclerotic calcifications involving the aorta and branch vessels. No aneurysm or dissection. The major venous structures are patent. No mesenteric or retroperitoneal mass or lymphadenopathy. Reproductive: The uterus and ovaries are unremarkable. Other: No pelvic mass or adenopathy. No free pelvic fluid collections. The bladder is unremarkable. No inguinal mass or adenopathy. Musculoskeletal: Stable L1 and L5 lesions. No  definite pelvic or hip lesions are identified. IMPRESSION: 1. Interval decreased in size of the mediastinal and hilar lymph nodes and decrease in size of pulmonary nodules. 2. No supraclavicular or axillary adenopathy. 3. Stable subareolar density in the left breast. 4. Stable osseous metastatic bone lesions involving the left hip with, L1 and L5 vertebral bodies. No new lesions. 5. No findings for abdominal/pelvic metastatic disease. Electronically Signed   By: Marijo Sanes M.D.   On: 04/11/2015 11:00   Ct Abdomen Pelvis W Contrast  04/11/2015  CLINICAL DATA:  Metastatic breast cancer. EXAM: CT CHEST, ABDOMEN, AND PELVIS WITH CONTRAST TECHNIQUE: Multidetector CT imaging of the chest, abdomen and pelvis was performed following the standard protocol during bolus administration of intravenous contrast. CONTRAST:  152m OMNIPAQUE IOHEXOL 300 MG/ML  SOLN COMPARISON:  CT scan 06/25/2014 FINDINGS: CT CHEST FINDINGS Mediastinum/Lymph Nodes: Stable subareolar left breast lesion. Stable surgical clips in the right axilla. No axillary or supraclavicular lymphadenopathy is demonstrated. The thyroid gland is normal. The heart is normal in size. No pericardial effusion. The aorta is normal in caliber. No dissection. The branch vessels are patent. Stable scattered coronary artery calcifications. Slight interval decrease and mediastinal and hilar lymphadenopathy when compared to the prior study. 7 mm upper mediastinal lymph node on image 13 previously measured 10 mm. 8.5 mm lymph node adjacent to the trachea on image 18 previously measured 13.5 mm. 13.5 and 15 mm pretracheal lymph nodes on image number 22 previously measured 17 and 15.5 mm. 14 mm subcarinal lymph node on image 29 previously measured 18 mm. 10.0 mm right hilar lymph node on image number 29 previously measured 11.5 mm. 6 mm left hilar node on image 29 previously measured 10 mm. Right internal mammary lymph node measures 9 mm and previously measured 10 mm. The  esophagus is grossly normal. Lungs/Pleura: The pulmonary nodules have decreased in size since the prior study. Index nodule in the left lower lobe measures 4 mm on image number 46 and previously measured 7 mm. 6 mm pulmonary nodule in the right lower lobe on image 31 previously measured 9 mm. No new pulmonary lesions. No acute overlying pulmonary findings. Right basilar scarring changes are slightly progressive. Musculoskeletal: Stable metastatic lesion involving the left fifth posterior rib. No other definite rib lesions. No obvious sternal or thoracic vertebral body lesions. No spinal canal compromise. CT ABDOMEN PELVIS FINDINGS Hepatobiliary: No focal hepatic lesions to suggest hepatic metastasis. The gallbladder is surgically absent. No significant common bowel duct dilatation. Pancreas: No mass, inflammation or ductal dilatation. Spleen: Normal size.  No focal lesions. Adrenals/Urinary  Tract: The adrenal glands and kidneys are unremarkable and stable. No findings for metastatic disease. Small renal cysts are noted. Stomach/Bowel: The stomach, duodenum, small bowel and colon are unremarkable. No inflammatory changes, mass lesions or obstructive findings. The terminal ileum is normal. The appendix is normal. Moderate sigmoid diverticulosis. Vascular/Lymphatic: Stable atherosclerotic calcifications involving the aorta and branch vessels. No aneurysm or dissection. The major venous structures are patent. No mesenteric or retroperitoneal mass or lymphadenopathy. Reproductive: The uterus and ovaries are unremarkable. Other: No pelvic mass or adenopathy. No free pelvic fluid collections. The bladder is unremarkable. No inguinal mass or adenopathy. Musculoskeletal: Stable L1 and L5 lesions. No definite pelvic or hip lesions are identified. IMPRESSION: 1. Interval decreased in size of the mediastinal and hilar lymph nodes and decrease in size of pulmonary nodules. 2. No supraclavicular or axillary adenopathy. 3. Stable  subareolar density in the left breast. 4. Stable osseous metastatic bone lesions involving the left hip with, L1 and L5 vertebral bodies. No new lesions. 5. No findings for abdominal/pelvic metastatic disease. Electronically Signed   By: Marijo Sanes M.D.   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 Her disease is mainly in the bone as well as the mediastinum. The patient is currently asymptomatic and has no visceral crisis. She is currently on treatment with Arimidex 1 mg by mouth daily since 01/22/2014 and tolerating her treatment fairly well with no significant adverse effects. Her recent CT scan of the head, chest, abdomen and pelvis showed further improvement in her disease especially in the mediastinal and hilar lymph nodes and decrease in the pulmonary nodules. Bone scan is still pending. I discussed the scan results with the patient and her husband. She still unhappy that her disease did not completely disappeared. I explained to Mrs. Reveles again that her treatment is palliative in nature and it is unlikely that she would have cure of her stage IV metastatic breast cancer. She will continue on her treatment for now but the patient may also consider palliative care at some point as she is not interested in continuing treatment for life according to her.  2) Metastatic bone disease: She was started on [place]. I also advised the patient to take a daily calcium and vitamin D supplements. She was also advised to keep her regular appointment with her dentist for good dental hygiene and evaluation.  3) Followup visit: The patient would come back for followup visit in 3 months with repeat blood work for evaluation and management any adverse effect of her treatment.  She was advised to call immediately if she has any concerning symptoms in the interval.  The patient voices  understanding of current disease status and treatment options and is in agreement with the current care plan.  All questions were answered. The patient knows to call the clinic with any problems, questions or concerns. We can certainly see the patient much sooner if necessary.  Disclaimer: This note was dictated with voice recognition software. Similar sounding words can inadvertently be transcribed and may not be corrected upon review.

## 2015-04-14 ENCOUNTER — Telehealth: Payer: Self-pay | Admitting: Internal Medicine

## 2015-04-14 NOTE — Telephone Encounter (Signed)
s.w. pt and advised on Jan thru march appt.Marland KitchenMarland KitchenMarland KitchenMarland Kitchenpt ok and aware

## 2015-04-15 ENCOUNTER — Ambulatory Visit
Admission: RE | Admit: 2015-04-15 | Discharge: 2015-04-15 | Disposition: A | Discharge status: Home / Self Care | Payer: Medicare Other | Source: Ambulatory Visit | Attending: Radiation Oncology | Admitting: Radiation Oncology

## 2015-04-15 ENCOUNTER — Encounter: Payer: Self-pay | Admitting: Radiation Oncology

## 2015-04-15 VITALS — BP 145/76 | HR 79 | Temp 97.4°F | Resp 20 | Wt 134.1 lb

## 2015-04-15 DIAGNOSIS — C7951 Secondary malignant neoplasm of bone: Secondary | ICD-10-CM | POA: Diagnosis not present

## 2015-04-15 NOTE — Progress Notes (Signed)
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

## 2015-04-15 NOTE — Progress Notes (Signed)
Radiation Oncology         [phone] ________________________________  Name: Erica Young MRN: [id number]  Date: 04/15/2015  DOB: 1940/10/17  Follow-Up Visit Note  CC: [name], MD  Marletta Lor, MD  Diagnosis:   Metastatic breast cancer with metastasis to the ethmoid sinus and right skull  Interval Since Last Radiation:  4 months.  12/06/2014 - 12/17/2014: The patient was treated to 2 separate target regions concurrently consisting o fa metastasis within the ethmoid sinus as well as a metastasis within the right skull. Both of these areas were treated using a IMRT technique to a dose of 30 gray in 10 fractions.  2007: In [place] for breast cancer.  Narrative:  The patient returns today for routine follow-up. She is on Arimidex 1 mg by mouth daily and started it on 01/22/2014. She is also taking Xgeva 120 g subcutaneously monthly per Dr. Julien Nordmann. She saw Dr. Julien Nordmann on 04/13/15 who went over the results of her CT scan on 04/11/15. Denies nausea or vision changes. Reports occasional abdominal pain and headaches. She has generalized aches and pains in her bones. Reports a good appetite and energy levels. Drinks ensuere daily. She is able to inhale through her nose with no difficulty, except minimal difficulty with deep inspiration. She reports the nasal congestion has resolved. She also reports heightened sensitivity to noise with the right ear.   BP 145/76 mmHg  Pulse 79  Temp(Src) 97.4 F (36.3 C) (Oral)  Resp 20  Wt 134 lb 1.6 oz (60.827 kg)  Wt Readings from Last 3 Encounters:  04/15/15 134 lb 1.6 oz (60.827 kg)  04/13/15 132 lb 12.8 oz (60.238 kg)  02/28/15 129 lb (58.514 kg)                                ALLERGIES:  is allergic to ciprofloxacin.  Meds: Current Outpatient Prescriptions  Medication Sig Dispense Refill  . ALPRAZolam (XANAX) 0.25 MG tablet TAKE 1 TABLET BY MOUTH THREE TIMES DAILY AS NEEDED FOR ANXIETY 90 tablet 2  . anastrozole  (ARIMIDEX) 1 MG tablet Take 1 tablet (1 mg total) by mouth daily. 30 tablet 3  . aspirin EC 81 MG tablet Take 81 mg by mouth daily.    . Calcium-Vitamin D (CALTRATE 600 PLUS-VIT D PO) Take 1 tablet by mouth 2 (two) times daily.     . cetirizine (ZYRTEC) 10 MG tablet Take 10 mg by mouth daily as needed.     . Cyanocobalamin (VITAMIN B 12 PO) Take 100 mcg by mouth daily.    Marland Kitchen denosumab (XGEVA) 120 MG/1.7ML SOLN injection Inject 120 mg into the skin every 30 (thirty) days.    Marland Kitchen diltiazem (CARDIZEM CD) 120 MG 24 hr capsule Take 1 capsule (120 mg total) by mouth daily. 90 capsule 4  . meclizine (ANTIVERT) 25 MG tablet Take 1 tablet (25 mg total) by mouth every 6 (six) hours as needed for dizziness. 60 tablet 3  . Melatonin 10 MG TABS Take 1 tablet by mouth daily.    . Multiple Vitamin (MULTIVITAMIN WITH MINERALS) TABS tablet Take 1 tablet by mouth daily.    . Probiotic Product (PROBIOTIC DAILY PO) Take 1 capsule by mouth once. Takes 1 a day    . simvastatin (ZOCOR) 5 MG tablet Take 1 tablet (5 mg total) by mouth daily. 90 tablet 2  . omeprazole (PRILOSEC) 40 MG capsule Take 1 capsule (40  mg total) by mouth daily. (Patient not taking: Reported on 04/13/2015) 60 capsule 0   No current facility-administered medications for this encounter.    Physical Findings: The patient is in no acute distress. Patient is alert and oriented.  weight is 134 lb 1.6 oz (60.827 kg). Her oral temperature is 97.4 F (36.3 C). Her blood pressure is 145/76 and her pulse is 79. Her respiration is 20. Marland Kitchen   Pleasant woman who is alert and oriented x3. Lungs are clear to auscultation bilaterally.  Lab Findings: Lab Results  Component Value Date   WBC 5.1 04/13/2015   HGB 13.9 04/13/2015   HCT 41.9 04/13/2015   MCV 97.2 04/13/2015   PLT 148 04/13/2015     Radiographic Findings: Ct Head W Wo Contrast  04/11/2015  CLINICAL DATA:  Metastatic breast cancer to the bone. Previous radiation. Follow-up. Staging. EXAM: CT  HEAD WITHOUT AND WITH CONTRAST TECHNIQUE: Contiguous axial images were obtained from the base of the skull through the vertex without and with intravenous contrast CONTRAST:  16m OMNIPAQUE IOHEXOL 300 MG/ML  SOLN COMPARISON:  MRI 11/11/2014.  Bone scan 06/25/2014. FINDINGS: The brain itself has a normal appearance without evidence of old or acute infarction, intra-axial mass lesion, hemorrhage, hydrocephalus or extra-axial collection. No abnormal brain enhancement occurs. Previously seen metastatic lesion in the right parietal bone measures maximally 3.1 cm as seen previously. There is no evidence of any extraosseous tumor indenting the brain on this study, indicating favorable response to therapy when compared to the previous MRI. Previously seen metastatic lesion affecting the skullbase in the region of the cribriform plate, left more than right but with extension to both sites and extension downward into the ethmoid sinuses is again demonstrated. Maximal diameter again 2.7 cm. There is some internal calcification as might be seen following treatment. Continued obstruction of the left frontal sinus with left frontal sinus opacification. Other sinuses are clear. IMPRESSION: No evidence of new or progressive disease. Right parietal calvarial metastasis with a region of bone involvement being no larger when compared to the previous MRI, 3.1 cm. Previously seen extraosseous component appears resolved. No mass effect upon the brain today. Metastasis centered in the cribriform plate region left more than right is no larger, again measuring 2.7 cm. Internal calcification which can be seen with treatment. No evidence of growth or intracranial extension today. Left frontal sinus obstruction because of this lesion. Electronically Signed   By: MNelson ChimesM.D.   On: 04/11/2015 09:49   Ct Chest W Contrast  04/11/2015  CLINICAL DATA:  Metastatic breast cancer. EXAM: CT CHEST, ABDOMEN, AND PELVIS WITH CONTRAST TECHNIQUE:  Multidetector CT imaging of the chest, abdomen and pelvis was performed following the standard protocol during bolus administration of intravenous contrast. CONTRAST:  1063mOMNIPAQUE IOHEXOL 300 MG/ML  SOLN COMPARISON:  CT scan 06/25/2014 FINDINGS: CT CHEST FINDINGS Mediastinum/Lymph Nodes: Stable subareolar left breast lesion. Stable surgical clips in the right axilla. No axillary or supraclavicular lymphadenopathy is demonstrated. The thyroid gland is normal. The heart is normal in size. No pericardial effusion. The aorta is normal in caliber. No dissection. The branch vessels are patent. Stable scattered coronary artery calcifications. Slight interval decrease and mediastinal and hilar lymphadenopathy when compared to the prior study. 7 mm upper mediastinal lymph node on image 13 previously measured 10 mm. 8.5 mm lymph node adjacent to the trachea on image 18 previously measured 13.5 mm. 13.5 and 15 mm pretracheal lymph nodes on image number 22 previously measured 17  and 15.5 mm. 14 mm subcarinal lymph node on image 29 previously measured 18 mm. 10.0 mm right hilar lymph node on image number 29 previously measured 11.5 mm. 6 mm left hilar node on image 29 previously measured 10 mm. Right internal mammary lymph node measures 9 mm and previously measured 10 mm. The esophagus is grossly normal. Lungs/Pleura: The pulmonary nodules have decreased in size since the prior study. Index nodule in the left lower lobe measures 4 mm on image number 46 and previously measured 7 mm. 6 mm pulmonary nodule in the right lower lobe on image 31 previously measured 9 mm. No new pulmonary lesions. No acute overlying pulmonary findings. Right basilar scarring changes are slightly progressive. Musculoskeletal: Stable metastatic lesion involving the left fifth posterior rib. No other definite rib lesions. No obvious sternal or thoracic vertebral body lesions. No spinal canal compromise. CT ABDOMEN PELVIS FINDINGS Hepatobiliary: No  focal hepatic lesions to suggest hepatic metastasis. The gallbladder is surgically absent. No significant common bowel duct dilatation. Pancreas: No mass, inflammation or ductal dilatation. Spleen: Normal size.  No focal lesions. Adrenals/Urinary Tract: The adrenal glands and kidneys are unremarkable and stable. No findings for metastatic disease. Small renal cysts are noted. Stomach/Bowel: The stomach, duodenum, small bowel and colon are unremarkable. No inflammatory changes, mass lesions or obstructive findings. The terminal ileum is normal. The appendix is normal. Moderate sigmoid diverticulosis. Vascular/Lymphatic: Stable atherosclerotic calcifications involving the aorta and branch vessels. No aneurysm or dissection. The major venous structures are patent. No mesenteric or retroperitoneal mass or lymphadenopathy. Reproductive: The uterus and ovaries are unremarkable. Other: No pelvic mass or adenopathy. No free pelvic fluid collections. The bladder is unremarkable. No inguinal mass or adenopathy. Musculoskeletal: Stable L1 and L5 lesions. No definite pelvic or hip lesions are identified. IMPRESSION: 1. Interval decreased in size of the mediastinal and hilar lymph nodes and decrease in size of pulmonary nodules. 2. No supraclavicular or axillary adenopathy. 3. Stable subareolar density in the left breast. 4. Stable osseous metastatic bone lesions involving the left hip with, L1 and L5 vertebral bodies. No new lesions. 5. No findings for abdominal/pelvic metastatic disease. Electronically Signed   By: Marijo Sanes M.D.   On: 04/11/2015 11:00   Ct Abdomen Pelvis W Contrast  04/11/2015  CLINICAL DATA:  Metastatic breast cancer. EXAM: CT CHEST, ABDOMEN, AND PELVIS WITH CONTRAST TECHNIQUE: Multidetector CT imaging of the chest, abdomen and pelvis was performed following the standard protocol during bolus administration of intravenous contrast. CONTRAST:  131m OMNIPAQUE IOHEXOL 300 MG/ML  SOLN COMPARISON:  CT  scan 06/25/2014 FINDINGS: CT CHEST FINDINGS Mediastinum/Lymph Nodes: Stable subareolar left breast lesion. Stable surgical clips in the right axilla. No axillary or supraclavicular lymphadenopathy is demonstrated. The thyroid gland is normal. The heart is normal in size. No pericardial effusion. The aorta is normal in caliber. No dissection. The branch vessels are patent. Stable scattered coronary artery calcifications. Slight interval decrease and mediastinal and hilar lymphadenopathy when compared to the prior study. 7 mm upper mediastinal lymph node on image 13 previously measured 10 mm. 8.5 mm lymph node adjacent to the trachea on image 18 previously measured 13.5 mm. 13.5 and 15 mm pretracheal lymph nodes on image number 22 previously measured 17 and 15.5 mm. 14 mm subcarinal lymph node on image 29 previously measured 18 mm. 10.0 mm right hilar lymph node on image number 29 previously measured 11.5 mm. 6 mm left hilar node on image 29 previously measured 10 mm. Right internal mammary lymph  node measures 9 mm and previously measured 10 mm. The esophagus is grossly normal. Lungs/Pleura: The pulmonary nodules have decreased in size since the prior study. Index nodule in the left lower lobe measures 4 mm on image number 46 and previously measured 7 mm. 6 mm pulmonary nodule in the right lower lobe on image 31 previously measured 9 mm. No new pulmonary lesions. No acute overlying pulmonary findings. Right basilar scarring changes are slightly progressive. Musculoskeletal: Stable metastatic lesion involving the left fifth posterior rib. No other definite rib lesions. No obvious sternal or thoracic vertebral body lesions. No spinal canal compromise. CT ABDOMEN PELVIS FINDINGS Hepatobiliary: No focal hepatic lesions to suggest hepatic metastasis. The gallbladder is surgically absent. No significant common bowel duct dilatation. Pancreas: No mass, inflammation or ductal dilatation. Spleen: Normal size.  No focal  lesions. Adrenals/Urinary Tract: The adrenal glands and kidneys are unremarkable and stable. No findings for metastatic disease. Small renal cysts are noted. Stomach/Bowel: The stomach, duodenum, small bowel and colon are unremarkable. No inflammatory changes, mass lesions or obstructive findings. The terminal ileum is normal. The appendix is normal. Moderate sigmoid diverticulosis. Vascular/Lymphatic: Stable atherosclerotic calcifications involving the aorta and branch vessels. No aneurysm or dissection. The major venous structures are patent. No mesenteric or retroperitoneal mass or lymphadenopathy. Reproductive: The uterus and ovaries are unremarkable. Other: No pelvic mass or adenopathy. No free pelvic fluid collections. The bladder is unremarkable. No inguinal mass or adenopathy. Musculoskeletal: Stable L1 and L5 lesions. No definite pelvic or hip lesions are identified. IMPRESSION: 1. Interval decreased in size of the mediastinal and hilar lymph nodes and decrease in size of pulmonary nodules. 2. No supraclavicular or axillary adenopathy. 3. Stable subareolar density in the left breast. 4. Stable osseous metastatic bone lesions involving the left hip with, L1 and L5 vertebral bodies. No new lesions. 5. No findings for abdominal/pelvic metastatic disease. Electronically Signed   By: Marijo Sanes M.D.   On: 04/11/2015 11:00    Impression: The patient clinically is doing well. No ongoing difficulties with regards to her prior treatment.  Plan:  The patient has a bone scan on 12/21 and she will continue the Arimadex and Xega. In the meantime, she will follow up with medical oncology in March and she will follow up with myself on a PRN basis. She could call me if she has any questions or concerns.  Jodelle Gross, M.D., Ph.D.  This document serves as a record of services personally performed by Kyung Rudd, MD. It was created on his behalf by Darcus Austin, a trained medical scribe. The creation of this  record is based on the scribe's personal observations and the provider's statements to them. This document has been checked and approved by the attending provider.

## 2015-04-20 ENCOUNTER — Encounter (HOSPITAL_COMMUNITY)
Admission: RE | Admit: 2015-04-20 | Discharge: 2015-04-20 | Disposition: A | Discharge status: Home / Self Care | Payer: Medicare Other | Source: Ambulatory Visit | Attending: Internal Medicine | Admitting: Internal Medicine

## 2015-04-20 DIAGNOSIS — C7951 Secondary malignant neoplasm of bone: Secondary | ICD-10-CM

## 2015-04-20 DIAGNOSIS — C50911 Malignant neoplasm of unspecified site of right female breast: Secondary | ICD-10-CM

## 2015-04-20 DIAGNOSIS — C50919 Malignant neoplasm of unspecified site of unspecified female breast: Secondary | ICD-10-CM | POA: Diagnosis not present

## 2015-04-20 MED ORDER — TECHNETIUM TC 99M MEDRONATE IV KIT
25.7000 | PACK | Freq: Once | INTRAVENOUS | Status: AC | PRN
  Administered 2015-04-20: 25.7 via INTRAVENOUS

## 2015-04-28 ENCOUNTER — Telehealth: Payer: Self-pay | Admitting: *Deleted

## 2015-04-28 NOTE — Telephone Encounter (Signed)
Returned call to pt. Pt concerns as follows.  What are the bone scan results? Do I continue with monthly injections? Discussed with pt per MD's POF 04/13/15  pt is to continue Injections monthly and will call her with additional information after MD has reviewed scan. Pt verbalized understanding. No further concerns.

## 2015-04-28 NOTE — Telephone Encounter (Signed)
Notified pt per MD her scan is stable, no new findings. Pt verbalized understanding. No further concerns.

## 2015-04-28 NOTE — Telephone Encounter (Signed)
Spouse called asking if they had done something wrong scheduling injection.  "We scheduled the injection.  Did we do this correctly or did we go over the doctor's orders."  Advised it couldn't have been scheduled without the doctor's orders.  [place] given phone, "I have questions about my treatment.  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  Instructed to leave questions and a nurse will call.

## 2015-05-10 ENCOUNTER — Other Ambulatory Visit: Payer: Self-pay | Admitting: *Deleted

## 2015-05-10 DIAGNOSIS — C50911 Malignant neoplasm of unspecified site of right female breast: Secondary | ICD-10-CM

## 2015-05-11 ENCOUNTER — Ambulatory Visit (HOSPITAL_BASED_OUTPATIENT_CLINIC_OR_DEPARTMENT_OTHER): Payer: Medicare Other

## 2015-05-11 ENCOUNTER — Other Ambulatory Visit (HOSPITAL_BASED_OUTPATIENT_CLINIC_OR_DEPARTMENT_OTHER): Payer: Medicare Other

## 2015-05-11 VITALS — BP 138/83 | HR 80 | Temp 97.5°F

## 2015-05-11 DIAGNOSIS — Z853 Personal history of malignant neoplasm of breast: Secondary | ICD-10-CM

## 2015-05-11 DIAGNOSIS — C50911 Malignant neoplasm of unspecified site of right female breast: Secondary | ICD-10-CM

## 2015-05-11 DIAGNOSIS — C7951 Secondary malignant neoplasm of bone: Secondary | ICD-10-CM | POA: Diagnosis present

## 2015-05-11 DIAGNOSIS — C50919 Malignant neoplasm of unspecified site of unspecified female breast: Secondary | ICD-10-CM | POA: Diagnosis present

## 2015-05-11 LAB — COMPREHENSIVE METABOLIC PANEL
ALT: 15 U/L (ref 0–55)
ANION GAP: 8 meq/L (ref 3–11)
AST: 16 U/L (ref 5–34)
Albumin: 3.7 g/dL (ref 3.5–5.0)
Alkaline Phosphatase: 55 U/L (ref 40–150)
BUN: 16.6 mg/dL (ref 7.0–26.0)
CHLORIDE: 103 meq/L (ref 98–109)
CO2: 29 meq/L (ref 22–29)
Calcium: 9.1 mg/dL (ref 8.4–10.4)
Creatinine: 0.9 mg/dL (ref 0.6–1.1)
EGFR: 63 mL/min/{1.73_m2} — ABNORMAL LOW (ref >90)
GLUCOSE: 112 mg/dL (ref 70–140)
Potassium: 4.3 mEq/L (ref 3.5–5.1)
SODIUM: 140 meq/L (ref 136–145)
Total Bilirubin: 0.47 mg/dL (ref 0.20–1.20)
Total Protein: 6.9 g/dL (ref 6.4–8.3)

## 2015-05-11 LAB — CBC WITH DIFFERENTIAL/PLATELET
BASO%: 0.3 % (ref 0.0–2.0)
Basophils Absolute: 0 10*3/uL (ref 0.0–0.1)
EOS%: 5.3 % (ref 0.0–7.0)
Eosinophils Absolute: 0.4 10*3/uL (ref 0.0–0.5)
HCT: 41.2 % (ref 34.8–46.6)
HGB: 13.7 g/dL (ref 11.6–15.9)
LYMPH%: 24.9 % (ref 14.0–49.7)
MCH: 32.3 pg (ref 25.1–34.0)
MCHC: 33.3 g/dL (ref 31.5–36.0)
MCV: 97.2 fL (ref 79.5–101.0)
MONO#: 0.4 10*3/uL (ref 0.1–0.9)
MONO%: 5.4 % (ref 0.0–14.0)
NEUT%: 64.1 % (ref 38.4–76.8)
NEUTROS ABS: 4.2 10*3/uL (ref 1.5–6.5)
PLATELETS: 148 10*3/uL (ref 145–400)
RBC: 4.24 10*6/uL (ref 3.70–5.45)
RDW: 12.7 % (ref 11.2–14.5)
WBC: 6.6 10*3/uL (ref 3.9–10.3)
lymph#: 1.7 10*3/uL (ref 0.9–3.3)

## 2015-05-11 MED ORDER — DENOSUMAB 120 MG/1.7ML ~~LOC~~ SOLN
120.0000 mg | Freq: Once | SUBCUTANEOUS | Status: AC
  Administered 2015-05-11: 120 mg via SUBCUTANEOUS
  Filled 2015-05-11: qty 1.7

## 2015-05-25 ENCOUNTER — Telehealth: Payer: Self-pay | Admitting: Internal Medicine

## 2015-05-25 NOTE — Telephone Encounter (Signed)
Spoke to patient about appointment change 3/8 to 3/7 due to provider being out of the office schedule sent via mail.

## 2015-06-07 ENCOUNTER — Other Ambulatory Visit: Payer: Self-pay | Admitting: *Deleted

## 2015-06-07 DIAGNOSIS — C50911 Malignant neoplasm of unspecified site of right female breast: Secondary | ICD-10-CM

## 2015-06-08 ENCOUNTER — Ambulatory Visit: Payer: Medicare Other

## 2015-06-08 ENCOUNTER — Other Ambulatory Visit (HOSPITAL_BASED_OUTPATIENT_CLINIC_OR_DEPARTMENT_OTHER): Payer: Medicare Other

## 2015-06-08 DIAGNOSIS — C50911 Malignant neoplasm of unspecified site of right female breast: Secondary | ICD-10-CM

## 2015-06-08 DIAGNOSIS — Z853 Personal history of malignant neoplasm of breast: Secondary | ICD-10-CM

## 2015-06-08 LAB — CBC WITH DIFFERENTIAL/PLATELET
BASO%: 0.4 % (ref 0.0–2.0)
BASOS ABS: 0 10*3/uL (ref 0.0–0.1)
EOS ABS: 0.3 10*3/uL (ref 0.0–0.5)
EOS%: 5.7 % (ref 0.0–7.0)
HCT: 42.6 % (ref 34.8–46.6)
HEMOGLOBIN: 14.4 g/dL (ref 11.6–15.9)
LYMPH%: 27.9 % (ref 14.0–49.7)
MCH: 32.7 pg (ref 25.1–34.0)
MCHC: 33.8 g/dL (ref 31.5–36.0)
MCV: 96.8 fL (ref 79.5–101.0)
MONO#: 0.3 10*3/uL (ref 0.1–0.9)
MONO%: 5.4 % (ref 0.0–14.0)
NEUT%: 60.6 % (ref 38.4–76.8)
NEUTROS ABS: 3.4 10*3/uL (ref 1.5–6.5)
PLATELETS: 148 10*3/uL (ref 145–400)
RBC: 4.4 10*6/uL (ref 3.70–5.45)
RDW: 12.9 % (ref 11.2–14.5)
WBC: 5.6 10*3/uL (ref 3.9–10.3)
lymph#: 1.6 10*3/uL (ref 0.9–3.3)

## 2015-06-08 LAB — COMPREHENSIVE METABOLIC PANEL
ALBUMIN: 3.8 g/dL (ref 3.5–5.0)
ALK PHOS: 58 U/L (ref 40–150)
ALT: 19 U/L (ref 0–55)
AST: 19 U/L (ref 5–34)
Anion Gap: 10 mEq/L (ref 3–11)
BILIRUBIN TOTAL: 0.45 mg/dL (ref 0.20–1.20)
BUN: 15.1 mg/dL (ref 7.0–26.0)
CO2: 26 mEq/L (ref 22–29)
CREATININE: 0.9 mg/dL (ref 0.6–1.1)
Calcium: 9 mg/dL (ref 8.4–10.4)
Chloride: 105 mEq/L (ref 98–109)
EGFR: 66 mL/min/{1.73_m2} — AB (ref >90)
GLUCOSE: 123 mg/dL (ref 70–140)
Potassium: 3.9 mEq/L (ref 3.5–5.1)
SODIUM: 141 meq/L (ref 136–145)
TOTAL PROTEIN: 7 g/dL (ref 6.4–8.3)

## 2015-06-08 MED ORDER — DENOSUMAB 120 MG/1.7ML ~~LOC~~ SOLN
120.0000 mg | Freq: Once | SUBCUTANEOUS | Status: DC
  Filled 2015-06-08: qty 1.7

## 2015-06-08 NOTE — Progress Notes (Signed)
Erica Young here for X-geva injection.  Complained that she had a couple episodes of lower jaw pain, it would come and go.  One time it woke her up during the night.  She hasn't seen her dentist for a year.  She hasn't noted any sores in her mouth and does brush her teeth routinely.  Because of the possibility of problem we will hold the X-geva today and she will go to the dentist for a check on the pain.  She has agreed with this plan.  Dr Julien Nordmann aware of this.

## 2015-06-16 ENCOUNTER — Telehealth: Payer: Self-pay | Admitting: Medical Oncology

## 2015-06-16 ENCOUNTER — Other Ambulatory Visit: Payer: Self-pay | Admitting: Medical Oncology

## 2015-06-16 DIAGNOSIS — C50011 Malignant neoplasm of nipple and areola, right female breast: Secondary | ICD-10-CM

## 2015-06-16 MED ORDER — ANASTROZOLE 1 MG PO TABS: 1.0000 mg | ORAL_TABLET | Freq: Every day | ORAL | Status: DC

## 2015-06-16 NOTE — Telephone Encounter (Signed)
Pt notified to continue arimedex and not stop it.

## 2015-06-16 NOTE — Telephone Encounter (Signed)
Pt notified that next xjeva is march 7

## 2015-06-16 NOTE — Progress Notes (Signed)
Pt notified re refill.Per Julien Nordmann next [place] is march 7th.

## 2015-06-16 NOTE — Telephone Encounter (Signed)
Pt saw Dr Aleda Grana yesterday for jaw pain. She also needs refill for arimidex. I called Dr Jetty Duhamel and she had oral surgeon look at xrays too, She will fax a note.

## 2015-06-30 ENCOUNTER — Other Ambulatory Visit: Payer: Self-pay | Admitting: Internal Medicine

## 2015-07-05 ENCOUNTER — Ambulatory Visit (HOSPITAL_BASED_OUTPATIENT_CLINIC_OR_DEPARTMENT_OTHER): Payer: Medicare Other

## 2015-07-05 ENCOUNTER — Ambulatory Visit (HOSPITAL_BASED_OUTPATIENT_CLINIC_OR_DEPARTMENT_OTHER): Payer: Medicare Other | Admitting: Internal Medicine

## 2015-07-05 ENCOUNTER — Encounter: Payer: Self-pay | Admitting: Internal Medicine

## 2015-07-05 ENCOUNTER — Other Ambulatory Visit (HOSPITAL_BASED_OUTPATIENT_CLINIC_OR_DEPARTMENT_OTHER): Payer: Medicare Other

## 2015-07-05 ENCOUNTER — Telehealth: Payer: Self-pay | Admitting: Internal Medicine

## 2015-07-05 VITALS — BP 142/81 | HR 74 | Temp 97.7°F | Resp 17 | Ht 62.0 in | Wt 136.4 lb

## 2015-07-05 DIAGNOSIS — C50919 Malignant neoplasm of unspecified site of unspecified female breast: Secondary | ICD-10-CM

## 2015-07-05 DIAGNOSIS — C7951 Secondary malignant neoplasm of bone: Secondary | ICD-10-CM | POA: Diagnosis not present

## 2015-07-05 DIAGNOSIS — C781 Secondary malignant neoplasm of mediastinum: Secondary | ICD-10-CM

## 2015-07-05 DIAGNOSIS — Z853 Personal history of malignant neoplasm of breast: Secondary | ICD-10-CM

## 2015-07-05 DIAGNOSIS — C50911 Malignant neoplasm of unspecified site of right female breast: Secondary | ICD-10-CM

## 2015-07-05 LAB — CBC WITH DIFFERENTIAL/PLATELET
BASO%: 0.9 % (ref 0.0–2.0)
BASOS ABS: 0.1 10*3/uL (ref 0.0–0.1)
EOS ABS: 0.3 10*3/uL (ref 0.0–0.5)
EOS%: 5.4 % (ref 0.0–7.0)
HEMATOCRIT: 43.4 % (ref 34.8–46.6)
HEMOGLOBIN: 14.2 g/dL (ref 11.6–15.9)
LYMPH#: 2.1 10*3/uL (ref 0.9–3.3)
LYMPH%: 36.2 % (ref 14.0–49.7)
MCH: 31.5 pg (ref 25.1–34.0)
MCHC: 32.8 g/dL (ref 31.5–36.0)
MCV: 96 fL (ref 79.5–101.0)
MONO#: 0.4 10*3/uL (ref 0.1–0.9)
MONO%: 7.4 % (ref 0.0–14.0)
NEUT%: 50.1 % (ref 38.4–76.8)
NEUTROS ABS: 2.8 10*3/uL (ref 1.5–6.5)
Platelets: 152 10*3/uL (ref 145–400)
RBC: 4.52 10*6/uL (ref 3.70–5.45)
RDW: 13.1 % (ref 11.2–14.5)
WBC: 5.7 10*3/uL (ref 3.9–10.3)

## 2015-07-05 LAB — COMPREHENSIVE METABOLIC PANEL
ALBUMIN: 3.9 g/dL (ref 3.5–5.0)
ALK PHOS: 65 U/L (ref 40–150)
ALT: 17 U/L (ref 0–55)
AST: 16 U/L (ref 5–34)
Anion Gap: 9 mEq/L (ref 3–11)
BILIRUBIN TOTAL: 0.31 mg/dL (ref 0.20–1.20)
BUN: 17.4 mg/dL (ref 7.0–26.0)
CALCIUM: 9.2 mg/dL (ref 8.4–10.4)
CO2: 26 mEq/L (ref 22–29)
Chloride: 105 mEq/L (ref 98–109)
Creatinine: 0.8 mg/dL (ref 0.6–1.1)
EGFR: 77 mL/min/{1.73_m2} — AB (ref >90)
GLUCOSE: 98 mg/dL (ref 70–140)
POTASSIUM: 4.2 meq/L (ref 3.5–5.1)
SODIUM: 140 meq/L (ref 136–145)
TOTAL PROTEIN: 7.2 g/dL (ref 6.4–8.3)

## 2015-07-05 MED ORDER — DENOSUMAB 120 MG/1.7ML ~~LOC~~ SOLN
120.0000 mg | Freq: Once | SUBCUTANEOUS | Status: AC
  Administered 2015-07-05: 120 mg via SUBCUTANEOUS
  Filled 2015-07-05: qty 1.7

## 2015-07-05 NOTE — Progress Notes (Signed)
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 The patient has a history of right breast carcinoma status post lumpectomy followed by radiotherapy in 2007 diagnosed in [place] and the patient declined any further treatment at that time.  PRIOR THERAPY: None  CURRENT THERAPY:  1) Arimidex 1 mg by mouth daily. Started 01/22/2014. 2) Xgeva 120 g subcutaneously monthly.  INTERVAL HISTORY: Erica Young 75 y.o. female returns to the clinic today for followup visit. The patient is feeling very well today with no specific complaints. She denied having any significant weight loss or night sweats. She has no nausea or vomiting. The patient denied having any significant fever or chills. She denied having any significant adverse effects from the treatment with Arimidex. She is also on treatment with Xgeva for the metastatic bone disease and no significant adverse from this treatment except for mild arthralgia after her injection. She had repeat CBC, comprehensive metabolic panel and CA 26.41 earlier today and she is here for evaluation and discussion of her lab results.   MEDICAL HISTORY: Past Medical History  Diagnosis Date  . HYPERLIPIDEMIA 03/03/2010  . CEREBROVASCULAR DISEASE 03/03/2010  . DIVERTICULOSIS, COLON 03/03/2010  . Low back pain   . Right leg pain   . History of cerebral artery stenosis     right middle  . Mastoiditis     noted on brain MRI  . Foramen ovale     positive bubble study  . Diverticulitis   . Fatty liver 08/02/09    as per U/S done by [facility]  . Internal hemorrhoid   . Anxiety   . Hypertension   . Ulcer   . Stroke [facility]) Oct. 2011    TIA  . HYPOTHYROIDISM 03/03/2010    no longer on meds  . Shortness of breath   .  Headache(784.0)     she thinks sinus headaches  . Rib fracture   . Cancer [facility]) 2007    right breat ca  , lumpectomy and radiation tx (declined chemo and additional prophylactic meds)  . Bone cancer ([place]) mri 11/11/14    right parietal bone,left cribriform plate metastases  . Allergy     ALLERGIES:  is allergic to ciprofloxacin.  MEDICATIONS:  Current Outpatient Prescriptions  Medication Sig Dispense Refill  . ALPRAZolam (XANAX) 0.25 MG tablet TAKE 1 TABLET BY MOUTH THREE TIMES DAILY AS NEEDED FOR ANXIETY 90 tablet 2  . anastrozole (ARIMIDEX) 1 MG tablet Take 1 tablet (1 mg total) by mouth daily. 30 tablet 3  . aspirin EC 81 MG tablet Take 81 mg by mouth daily.    . Calcium-Vitamin D (CALTRATE 600 PLUS-VIT D PO) Take 1 tablet by mouth 2 (two) times daily.     . cetirizine (ZYRTEC) 10 MG tablet Take 10 mg by mouth daily as needed.     . Cyanocobalamin (VITAMIN B 12 PO) Take 100 mcg by mouth daily.    Marland Kitchen denosumab (XGEVA) 120 MG/1.7ML SOLN injection Inject 120 mg into the skin every 30 (thirty) days.    Marland Kitchen diltiazem (CARDIZEM CD) 120 MG 24 hr capsule Take 1 capsule (120 mg total) by mouth daily. 90 capsule 4  . meclizine (ANTIVERT) 25 MG tablet Take 1 tablet (25 mg total) by mouth every 6 (six) hours as needed for  dizziness. 60 tablet 3  . Melatonin 10 MG TABS Take 1 tablet by mouth daily.    . Multiple Vitamin (MULTIVITAMIN WITH MINERALS) TABS tablet Take 1 tablet by mouth daily.    Marland Kitchen omeprazole (PRILOSEC) 40 MG capsule Take 1 capsule (40 mg total) by mouth daily. (Patient not taking: Reported on 04/13/2015) 60 capsule 0  . Probiotic Product (PROBIOTIC DAILY PO) Take 1 capsule by mouth once. Takes 1 a day    . simvastatin (ZOCOR) 5 MG tablet Take 1 tablet (5 mg total) by mouth daily. 90 tablet 2   No current facility-administered medications for this visit.    SURGICAL HISTORY:  Past Surgical History  Procedure Laterality Date  . Cholecystectomy    . Breast lumpectomy      right  .  Tonsillectomy    . Colonoscopy    . Tubal ligation    . Shoulder surgery      right shoulder (dislocation)  . Video bronchoscopy with endobronchial ultrasound N/A 01/07/2014    Procedure: VIDEO BRONCHOSCOPY WITH ENDOBRONCHIAL ULTRASOUND;  Surgeon: Ivin Poot, MD;  Location: [facility] OR;  Service: Thoracic;  Laterality: N/A;    REVIEW OF SYSTEMS:  A comprehensive review of systems was negative.   PHYSICAL EXAMINATION: General appearance: alert, cooperative and no distress Head: Normocephalic, without obvious abnormality, atraumatic Neck: no adenopathy, no JVD, supple, symmetrical, trachea midline and thyroid not enlarged, symmetric, no tenderness/mass/nodules Lymph nodes: Cervical, supraclavicular, and axillary nodes normal. Resp: clear to auscultation bilaterally Back: symmetric, no curvature. ROM normal. No CVA tenderness. Cardio: regular rate and rhythm, S1, S2 normal, no murmur, click, rub or gallop GI: soft, non-tender; bowel sounds normal; no masses,  no organomegaly Extremities: extremities normal, atraumatic, no cyanosis or edema Neurologic: Alert and oriented X 3, normal strength and tone. Normal symmetric reflexes. Normal coordination and gait  ECOG PERFORMANCE STATUS: 1 - Symptomatic but completely ambulatory  Blood pressure 142/81, pulse 74, temperature 97.7 F (36.5 C), temperature source Oral, resp. rate 17, height '5\' 2"'$  (1.575 m), weight 136 lb 6.4 oz (61.871 kg), SpO2 97 %.  LABORATORY DATA: Lab Results  Component Value Date   WBC 5.7 07/05/2015   HGB 14.2 07/05/2015   HCT 43.4 07/05/2015   MCV 96.0 07/05/2015   PLT 152 07/05/2015      Chemistry      Component Value Date/Time   NA 140 07/05/2015 1355   NA 138 01/06/2014 0910   K 4.2 07/05/2015 1355   K 4.3 01/06/2014 0910   CL 104 01/06/2014 0910   CO2 26 07/05/2015 1355   CO2 22 01/06/2014 0910   BUN 17.4 07/05/2015 1355   BUN 9 01/06/2014 0910   CREATININE 0.8 07/05/2015 1355   CREATININE 0.56  01/06/2014 0910      Component Value Date/Time   CALCIUM 9.2 07/05/2015 1355   CALCIUM 9.0 01/06/2014 0910   ALKPHOS 65 07/05/2015 1355   ALKPHOS 87 01/06/2014 0910   AST 16 07/05/2015 1355   AST 17 01/06/2014 0910   ALT 17 07/05/2015 1355   ALT 16 01/06/2014 0910   BILITOT 0.31 07/05/2015 1355   BILITOT 0.3 01/06/2014 0910       RADIOGRAPHIC STUDIES: No results found. 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 Her disease is mainly in the bone as well as the mediastinum. The patient is currently asymptomatic and has no visceral crisis. She is  currently on treatment with Arimidex 1 mg by mouth daily since 01/22/2014 and tolerating her treatment fairly well with no significant adverse effects.  The patient is feeling fine today with no specific complaints. I recommended for her to continue on her current treatment with Arimidex.  2) Metastatic bone disease: She was started on [place]. I also advised the patient to take a daily calcium and vitamin D supplements. She was also advised to keep her regular appointment with her dentist for good dental hygiene and evaluation.  3) Followup visit: The patient would come back for followup visit in 3 months with repeat blood work as well as CT scan of the chest, abdomen and pelvis for restaging of her disease.  She was advised to call immediately if she has any concerning symptoms in the interval.  The patient voices understanding of current disease status and treatment options and is in agreement with the current care plan.  All questions were answered. The patient knows to call the clinic with any problems, questions or concerns. We can certainly see the patient much sooner if necessary.  Disclaimer: This note was dictated with voice recognition software. Similar sounding words can inadvertently be transcribed and may not be corrected  upon review.

## 2015-07-05 NOTE — Telephone Encounter (Signed)
Gave and printed appt sched and avs for pt for April thru June °

## 2015-07-06 ENCOUNTER — Ambulatory Visit: Payer: Medicare Other | Admitting: Internal Medicine

## 2015-07-06 ENCOUNTER — Ambulatory Visit: Payer: Medicare Other

## 2015-07-06 ENCOUNTER — Other Ambulatory Visit: Payer: Medicare Other

## 2015-07-06 LAB — CANCER ANTIGEN 27-29 (PARALLEL TESTING): CA 27.29: 27 U/mL (ref <38)

## 2015-07-06 LAB — CANCER ANTIGEN 27.29: CAN 27.29: 23.7 U/mL (ref 0.0–38.6)

## 2015-07-20 ENCOUNTER — Other Ambulatory Visit: Payer: Medicare Other

## 2015-07-20 ENCOUNTER — Ambulatory Visit: Payer: Medicare Other | Admitting: Internal Medicine

## 2015-07-22 ENCOUNTER — Other Ambulatory Visit: Payer: Self-pay | Admitting: Internal Medicine

## 2015-07-23 ENCOUNTER — Other Ambulatory Visit: Payer: Self-pay | Admitting: Internal Medicine

## 2015-07-27 ENCOUNTER — Telehealth: Payer: Self-pay | Admitting: Internal Medicine

## 2015-07-27 DIAGNOSIS — H9209 Otalgia, unspecified ear: Secondary | ICD-10-CM

## 2015-07-27 NOTE — Telephone Encounter (Signed)
Pt would like a referral vestibular rehab for her ears..  Pt wakes up everyday with pain around both ears.  Vestibular rehab  [facility] [phone]  Pt has appointment 4/28, but prefers not to wait until then for referral. Would like asap.

## 2015-07-28 ENCOUNTER — Telehealth: Payer: Self-pay

## 2015-07-28 NOTE — Telephone Encounter (Signed)
Spoke to pt, told her order for referral for Vestibular therapy as requested was done and our referral coordinator will take care of sending referral over. Pt verbalized understanding.

## 2015-07-28 NOTE — Telephone Encounter (Signed)
Patient calling today upset about a bill that she is being charged and would like to speak to Dr. Worthy Flank nurse.  She is requesting a call back today.

## 2015-07-28 NOTE — Addendum Note (Signed)
Addended by: Marian Sorrow on: 07/28/2015 12:19 PM   Modules accepted: Orders

## 2015-07-28 NOTE — Telephone Encounter (Signed)
Pt stated she did not receive an xgeva  injection on 05/11/15 but was billed for it. I told pt that she did xgeva on 05/11/15 and that she did not get xjeva on 06/08/15.

## 2015-07-28 NOTE — Telephone Encounter (Signed)
ok 

## 2015-07-28 NOTE — Telephone Encounter (Signed)
Okay to order referral?

## 2015-08-01 ENCOUNTER — Other Ambulatory Visit: Payer: Self-pay | Admitting: *Deleted

## 2015-08-01 DIAGNOSIS — C50911 Malignant neoplasm of unspecified site of right female breast: Secondary | ICD-10-CM

## 2015-08-02 ENCOUNTER — Ambulatory Visit (HOSPITAL_BASED_OUTPATIENT_CLINIC_OR_DEPARTMENT_OTHER): Payer: Medicare Other

## 2015-08-02 ENCOUNTER — Other Ambulatory Visit (HOSPITAL_BASED_OUTPATIENT_CLINIC_OR_DEPARTMENT_OTHER): Payer: Medicare Other

## 2015-08-02 VITALS — BP 157/82 | HR 72 | Temp 98.2°F

## 2015-08-02 DIAGNOSIS — C7951 Secondary malignant neoplasm of bone: Secondary | ICD-10-CM | POA: Diagnosis not present

## 2015-08-02 DIAGNOSIS — C50911 Malignant neoplasm of unspecified site of right female breast: Secondary | ICD-10-CM

## 2015-08-02 DIAGNOSIS — C781 Secondary malignant neoplasm of mediastinum: Secondary | ICD-10-CM | POA: Diagnosis not present

## 2015-08-02 DIAGNOSIS — Z853 Personal history of malignant neoplasm of breast: Secondary | ICD-10-CM

## 2015-08-02 DIAGNOSIS — C50919 Malignant neoplasm of unspecified site of unspecified female breast: Secondary | ICD-10-CM

## 2015-08-02 LAB — CBC WITH DIFFERENTIAL/PLATELET
BASO%: 0.6 % (ref 0.0–2.0)
BASOS ABS: 0 10*3/uL (ref 0.0–0.1)
EOS ABS: 0.3 10*3/uL (ref 0.0–0.5)
EOS%: 4.9 % (ref 0.0–7.0)
HCT: 42.5 % (ref 34.8–46.6)
HGB: 14.4 g/dL (ref 11.6–15.9)
LYMPH%: 35 % (ref 14.0–49.7)
MCH: 32.7 pg (ref 25.1–34.0)
MCHC: 33.9 g/dL (ref 31.5–36.0)
MCV: 96.6 fL (ref 79.5–101.0)
MONO#: 0.3 10*3/uL (ref 0.1–0.9)
MONO%: 6.2 % (ref 0.0–14.0)
NEUT#: 2.9 10*3/uL (ref 1.5–6.5)
NEUT%: 53.3 % (ref 38.4–76.8)
Platelets: 152 10*3/uL (ref 145–400)
RBC: 4.4 10*6/uL (ref 3.70–5.45)
RDW: 13 % (ref 11.2–14.5)
WBC: 5.3 10*3/uL (ref 3.9–10.3)
lymph#: 1.9 10*3/uL (ref 0.9–3.3)

## 2015-08-02 LAB — COMPREHENSIVE METABOLIC PANEL
ALT: 16 U/L (ref 0–55)
AST: 16 U/L (ref 5–34)
Albumin: 3.7 g/dL (ref 3.5–5.0)
Alkaline Phosphatase: 60 U/L (ref 40–150)
Anion Gap: 8 mEq/L (ref 3–11)
BUN: 15.8 mg/dL (ref 7.0–26.0)
CO2: 30 meq/L — AB (ref 22–29)
Calcium: 9.5 mg/dL (ref 8.4–10.4)
Chloride: 104 mEq/L (ref 98–109)
Creatinine: 0.9 mg/dL (ref 0.6–1.1)
EGFR: 66 mL/min/{1.73_m2} — AB (ref >90)
GLUCOSE: 109 mg/dL (ref 70–140)
POTASSIUM: 4.1 meq/L (ref 3.5–5.1)
SODIUM: 143 meq/L (ref 136–145)
Total Bilirubin: 0.55 mg/dL (ref 0.20–1.20)
Total Protein: 7.1 g/dL (ref 6.4–8.3)

## 2015-08-02 MED ORDER — DENOSUMAB 120 MG/1.7ML ~~LOC~~ SOLN
120.0000 mg | Freq: Once | SUBCUTANEOUS | Status: AC
Start: 2015-08-02 — End: 2015-08-02
  Administered 2015-08-02: 120 mg via SUBCUTANEOUS
  Filled 2015-08-02: qty 1.7

## 2015-08-03 ENCOUNTER — Ambulatory Visit: Payer: Medicare Other | Attending: Internal Medicine | Admitting: Rehabilitative and Restorative Service Providers"

## 2015-08-03 DIAGNOSIS — R42 Dizziness and giddiness: Secondary | ICD-10-CM | POA: Diagnosis not present

## 2015-08-03 DIAGNOSIS — R2689 Other abnormalities of gait and mobility: Secondary | ICD-10-CM | POA: Diagnosis not present

## 2015-08-03 NOTE — Patient Instructions (Signed)
Gaze Stabilization: Tip Card  1.Target must remain in focus, not blurry, and appear stationary while head is in motion. 2.Perform exercises with small head movements (45 to either side of midline). 3.Increase speed of head motion so long as target is in focus. 4.If you wear eyeglasses, be sure you can see target through lens (therapist will give specific instructions for bifocal / progressive lenses). 5.These exercises may provoke dizziness or nausea. Work through these symptoms. If too dizzy, slow head movement slightly. Rest between each exercise. 6.Exercises demand concentration; avoid distractions. 7.For safety, perform standing exercises close to a counter, wall, corner, or next to someone.  Copyright  VHI. All rights reserved.  Gaze Stabilization: Sitting    Keeping eyes on target on wall 3 feet away, tilt head down slightly and move head side to side 5 times. Let symptoms settle.   Repeat while moving head up and down 5 times. Do __2-3__ sessions per day.  Copyright  VHI. All rights reserved.

## 2015-08-04 NOTE — Therapy (Signed)
[facility] [address] [place], [place], [address] Phone: [phone]   Fax:  [phone]  Physical Therapy Evaluation  Patient Details  Name: Erica Young MRN: [id number] Date of Birth: [birth year] Referring Provider: Bluford Kaufmann, MD  Encounter Date: 08/03/2015      PT End of Session - 08/04/15 1007    Visit Number 1   Number of Visits 8   Date for PT Re-Evaluation 09/18/15   Authorization Type G code every 10th visit   PT Start Time 0803   PT Stop Time 0850   PT Time Calculation (min) 47 min   Activity Tolerance Patient tolerated treatment well   Behavior During Therapy [facility] for tasks assessed/performed      Past Medical History  Diagnosis Date  . HYPERLIPIDEMIA 03/03/2010  . CEREBROVASCULAR DISEASE 03/03/2010  . DIVERTICULOSIS, COLON 03/03/2010  . Low back pain   . Right leg pain   . History of cerebral artery stenosis     right middle  . Mastoiditis     noted on brain MRI  . Foramen ovale     positive bubble study  . Diverticulitis   . Fatty liver 08/02/09    as per U/S done by [facility]  . Internal hemorrhoid   . Anxiety   . Hypertension   . Ulcer   . Stroke [facility]) Oct. 2011    TIA  . HYPOTHYROIDISM 03/03/2010    no longer on meds  . Shortness of breath   . Headache(784.0)     she thinks sinus headaches  . Rib fracture   . Cancer [facility]) 2007    right breat ca  , lumpectomy and radiation tx (declined chemo and additional prophylactic meds)  . Bone cancer ([place]) mri 11/11/14    right parietal bone,left cribriform plate metastases  . Allergy     Past Surgical History  Procedure Laterality Date  . Cholecystectomy    . Breast lumpectomy      right  . Tonsillectomy    . Colonoscopy    . Tubal ligation    . Shoulder surgery      right shoulder (dislocation)  . Video bronchoscopy with endobronchial ultrasound N/A 01/07/2014    Procedure: VIDEO BRONCHOSCOPY WITH ENDOBRONCHIAL  ULTRASOUND;  Surgeon: Ivin Poot, MD;  Location: [facility] OR;  Service: Thoracic;  Laterality: N/A;    There were no vitals filed for this visit.  Visit Diagnosis:  Other abnormalities of gait and mobility  Dizziness and giddiness          [facility] PT Assessment - 08/03/15 0828    Assessment   Medical Diagnosis vertigo and ear pain   Referring Provider Bluford Kaufmann, MD   Onset Date/Surgical Date --  04/2014   Prior Therapy none   Precautions   Precautions Fall   Balance Screen   Has the patient fallen in the past 6 months No   Has the patient had a decrease in activity level because of a fear of falling?  Yes  "I'm very careful"   Is the patient reluctant to leave their home because of a fear of falling?  Yes  more hesitant on steps in home as well   Home Environment   Living Environment Private residence   Living Arrangements Spouse/significant other   Type of [place] to enter   Entrance Stairs-Number of Steps 4   [place] Two level   Ambulation/Gait   Ambulation/Gait  Yes   Ambulation/Gait Assistance 7: Independent   Ambulation Distance (Feet) 200 Feet   Assistive device None   Ambulation Surface Level   Gait velocity 2.47 ft/sec   Stairs Yes   Stairs Assistance 6: Modified independent (Device/Increase time)  reports dizziness   Stair Management Technique Two rails;Alternating pattern   Number of Stairs 4   Gait Comments Dynamic gait activities with head motion provoke dizziness and imbalance to right and left sides.  Transition to stand provokes a pulling sensation laterally and patient takes steps to recover.     Standardized Balance Assessment   Standardized Balance Assessment Berg Balance Test   Berg Balance Test   Sit to Stand Able to stand without using hands and stabilize independently   Standing Unsupported Able to stand safely 2 minutes   Sitting with Back Unsupported but Feet Supported on Floor or Stool Able to sit safely and  securely 2 minutes   Stand to Sit Sits safely with minimal use of hands   Transfers Able to transfer safely, minor use of hands   Standing Unsupported with Eyes Closed Able to stand 3 seconds   Standing Ubsupported with Feet Together Able to place feet together independently and stand 1 minute safely   From Standing, Reach Forward with Outstretched Arm Can reach forward >12 cm safely (5")   From Standing Position, Pick up Object from Floor Unable to pick up shoe, but reaches 2-5 cm (1-2") from shoe and balances independently   From Standing Position, Turn to Look Behind Over each Shoulder Needs assist to keep from losing balance and falling   Turn 360 Degrees Needs close supervision or verbal cueing   Standing Unsupported, Alternately Place Feet on Step/Stool Able to complete 4 steps without aid or supervision   Standing Unsupported, One Foot in Front Able to take small step independently and hold 30 seconds   Standing on One Leg Able to lift leg independently and hold equal to or more than 3 seconds   Total Score 38   Berg comment: 38/56 indicating high fall risk.    turns, weight shifting provoke greatest instability            Vestibular Assessment - 08/03/15 0820    Vestibular Assessment   General Observation Patient ambulates independently into clinic with some staggering to the right. She is able to self recover during loss of balance.  Has cateracts; denies vision changes or HA.   Symptom Behavior   Type of Dizziness Spinning   Frequency of Dizziness intermittent in nature, last episode was 3-4 months prior   Duration of Dizziness hours (usually 2-4 hours)   Aggravating Factors Sitting in moving car  bending   Relieving Factors No known relieving factors   Occulomotor Exam   Occulomotor Alignment Normal   Spontaneous Absent   Gaze-induced Absent   Smooth Pursuits Intact   Saccades Intact   Vestibulo-Occular Reflex   VOR 1 Head Only (x 1 viewing) --  head impulse test:  + bilaterally R>L refixation saccade   Comment Slow VOR x 1 viewing provokes mild symptoms with neck guarding and patient reporting "I'm afraid" to move.   Positional Testing   Sidelying Test Sidelying Right;Sidelying Left   Horizontal Canal Testing Horizontal Canal Right;Horizontal Canal Left   Sidelying Right   Sidelying Right Duration none  heaviness in head with return to sitting   Sidelying Right Symptoms No nystagmus   Sidelying Left   Sidelying Left Duration no dizziness, however provokes  an "uncomfortable" feeling and patient moves out of position   Sidelying Left Symptoms No nystagmus   Horizontal Canal Right   Horizontal Canal Right Duration "uncomfortable" sensation, no dizziness   Horizontal Canal Right Symptoms Normal   Horizontal Canal Left   Horizontal Canal Left Duration "uncomfortable" feeling, no dizziness   Horizontal Canal Left Symptoms Normal   Positional Sensitivities   Head Turning x 5 Mild dizziness  neck tightness noted with muscle guarding   Head Nodding x 5 Mild dizziness  provokes neck pain                Vestibular Treatment/Exercise - 08/03/15 0846    Vestibular Treatment/Exercise   Vestibular Treatment Provided Gaze   Gaze Exercises X1 Viewing Horizontal;X1 Viewing Vertical   X1 Viewing Horizontal   Foot Position seated   Comments Instructed patient initially in VOR x 1 viewing in horizontal and vertical planes.  She needed continued cues to perform correctly.  Patient's husband was asked to come back into clinic to learn exercises for improved accuracy in the home.               PT Education - 08/04/15 1005    Education provided Yes   Education Details HEP: gaze adaptation   Person(s) Educated Patient;Spouse   Methods Explanation;Demonstration;Handout   Comprehension Verbalized understanding;Returned demonstration             PT Long Term Goals - 08/04/15 1007    PT LONG TERM GOAL #1   Title The patient will be indep  with HEP for neck stretching, VOR adaptation, balance, habituation, and general mobility.   Baseline Target date 09/18/2015 *>30 days due to scheduling   Time 4   Period Weeks   PT LONG TERM GOAL #2   Title The patient will improve Berg score from 38/56 to > or equal to 44/56 to demo decreasing risk for falls.   Time 4   Period Weeks   PT LONG TERM GOAL #3   Title The patient will improve gait speed from 2.47 ft/sec to >or equal to 2.8 ft/sec.   Time 4   Period Weeks   PT LONG TERM GOAL #4   Title The patient will tolerate gaze x 1 viewing x 30 seconds nonstop without dizziness at self regulated pace.   Time 4   Period Weeks   PT LONG TERM GOAL #5   Title The patient will tolerate seated horizontal and vertical head turns x 5 reps without dizziness at self regulated pace.   Time 4   Period Weeks               Plan - 08/04/15 1010    Clinical Impression Statement The patient is a 75 year old female with complex medical history including imbalance, intermittent episodes of vertigo, ear pain, and guarded movements with neck discomfort.  PT educated patient on focus of therapy to include habituation for motion intolerance, flexibility/stretching for neck guarding, balance and gait training,    Pt will benefit from skilled therapeutic intervention in order to improve on the following deficits Abnormal gait;Decreased balance;Dizziness;Decreased mobility   Rehab Potential Good   PT Frequency 2x / week   PT Duration 4 weeks   PT Treatment/Interventions Balance training;Neuromuscular re-education;Patient/family education;Gait training;Functional mobility training;Therapeutic activities;Therapeutic exercise;Vestibular;Canalith Repostioning   PT Next Visit Plan Check gaze x 1, neck flexibility, provide habituation, balance ex.   Consulted and Agree with Plan of Care Patient  G-Codes - 08/04/15 1014    Functional Assessment Tool Used Berg=38/56   Functional Limitation  Mobility: Walking and moving around   Mobility: Walking and Moving Around Current Status [phone]) At least 20 percent but less than 40 percent impaired, limited or restricted   Mobility: Walking and Moving Around Goal Status [phone]) At least 1 percent but less than 20 percent impaired, limited or restricted       Problem List Patient Active Problem List   Diagnosis Date Noted  . Bone metastases ([place]) 11/22/2014  . Rhinitis, allergic 02/12/2014  . Cancer of right breast, stage 4 ([place]) 01/23/2014  . History of breast cancer 01/11/2014  . Chronic rhinitis 09/17/2013  . Hilar adenopathy 05/12/2013  . Abdominal pain 05/07/2011  . Hypertension 06/09/2010  . BACK PAIN 04/10/2010  . HYPERLIPIDEMIA 03/03/2010  . Cerebrovascular disease 03/03/2010  . DIVERTICULOSIS, COLON 03/03/2010    Betheny Suchecki, PT 08/04/2015, 4:10 PM  [facility] [address] [place], [place], [address] Phone: [phone]   Fax:  [phone]  Name: Erica Young MRN: [id number] Date of Birth: 30-Jun-1940

## 2015-08-11 ENCOUNTER — Ambulatory Visit: Payer: Medicare Other | Admitting: Rehabilitative and Restorative Service Providers"

## 2015-08-11 DIAGNOSIS — R2689 Other abnormalities of gait and mobility: Secondary | ICD-10-CM

## 2015-08-11 DIAGNOSIS — R42 Dizziness and giddiness: Secondary | ICD-10-CM

## 2015-08-11 NOTE — Therapy (Signed)
[facility] [address] [place], [place], [address] Phone: [phone]   Fax:  [phone]  Physical Therapy Treatment  Patient Details  Name: Erica Young MRN: [id number] Date of Birth: May 06, 1940 Referring Provider: Bluford Kaufmann, MD  Encounter Date: 08/11/2015      PT End of Session - 08/11/15 0927    Visit Number 2   Number of Visits 8   Date for PT Re-Evaluation 09/18/15   Authorization Type G code every 10th visit   PT Start Time 0850   PT Stop Time 0930   PT Time Calculation (min) 40 min   Equipment Utilized During Treatment Gait belt   Activity Tolerance Patient tolerated treatment well   Behavior During Therapy [facility] for tasks assessed/performed      Past Medical History  Diagnosis Date  . HYPERLIPIDEMIA 03/03/2010  . CEREBROVASCULAR DISEASE 03/03/2010  . DIVERTICULOSIS, COLON 03/03/2010  . Low back pain   . Right leg pain   . History of cerebral artery stenosis     right middle  . Mastoiditis     noted on brain MRI  . Foramen ovale     positive bubble study  . Diverticulitis   . Fatty liver 08/02/09    as per U/S done by [facility]  . Internal hemorrhoid   . Anxiety   . Hypertension   . Ulcer   . Stroke [facility]) Oct. 2011    TIA  . HYPOTHYROIDISM 03/03/2010    no longer on meds  . Shortness of breath   . Headache(784.0)     she thinks sinus headaches  . Rib fracture   . Cancer [facility]) 2007    right breat ca  , lumpectomy and radiation tx (declined chemo and additional prophylactic meds)  . Bone cancer ([place]) mri 11/11/14    right parietal bone,left cribriform plate metastases  . Allergy     Past Surgical History  Procedure Laterality Date  . Cholecystectomy    . Breast lumpectomy      right  . Tonsillectomy    . Colonoscopy    . Tubal ligation    . Shoulder surgery      right shoulder (dislocation)  . Video bronchoscopy with endobronchial ultrasound N/A 01/07/2014   Procedure: VIDEO BRONCHOSCOPY WITH ENDOBRONCHIAL ULTRASOUND;  Surgeon: Ivin Poot, MD;  Location: [facility] OR;  Service: Thoracic;  Laterality: N/A;    There were no vitals filed for this visit.      Subjective Assessment - 08/11/15 0853    Subjective The patient reports doing HEP regularly.  She notes no dizziness today.  She continues with ear pain oin the right side.     Pertinent History Cancer (currently undergoing treatment)-xgeva; metastatic bone disease   Patient Stated Goals "I don't want to get vertigo anymore."   Currently in Pain? No/denies  none at this time          [facility] Adult PT Treatment/Exercise - 08/11/15 1224    Ambulation/Gait   Ambulation/Gait Yes   Ambulation/Gait Assistance 7: Independent   Ambulation Distance (Feet) 800 Feet   Assistive device None   Ambulation Surface Level   Gait Comments Gait activities with ball toss, direction changes and head motion in horizontal and vertical planes.  Patient tolerates dynamic gait well with CGA for safety provided.   Neuro Re-ed    Neuro Re-ed Details  Standing single limb stance, tandem stance, feet together with eyes closed in corner for safety.  Provided for HEP.   Exercises   Exercises Neck;Shoulder   Neck Exercises: Seated   Shoulder Rolls 10 reps;Backwards   Other Seated Exercise Stretching for levator scapulae bringing chin to point to shoulder x 2 reps each side.           Vestibular Treatment/Exercise - 08/11/15 0858    Vestibular Treatment/Exercise   Vestibular Treatment Provided Gaze;Habituation   Habituation Exercises Seated Horizontal Head Turns;Seated Vertical Head Turns;Seated Diagonal Head Turns   Gaze Exercises X1 Viewing Horizontal;X1 Viewing Vertical   Seated Horizontal Head Turns   Number of Reps  5   Symptom Description  mild increase in symptoms; provided for HEP    Seated Vertical Head Turns   Number of Reps  5   Symptom Description  mild increase in symptoms   Seated Diagonal Head  Turns   Number of Reps 5   Symptoms Description  To each direction with mild increase in symptoms.   X1 Viewing Horizontal   Foot Position seated and standing   Reps 3  performed x 30 seconds    Comments Provided verbal and demonstration cues for correct technique encouraging increased speed of motion.           PT Education - 08/11/15 0926    Education provided Yes   Education Details HEP: Gaze in standing x 1 viewing, tandem stance, feet together + eyes closed, neck/levator stretch, seated horizontal head turns   Person(s) Educated Patient   Methods Explanation;Demonstration;Handout   Comprehension Verbalized understanding;Returned demonstration;Tactile cues required             PT Long Term Goals - 08/04/15 1007    PT LONG TERM GOAL #1   Title The patient will be indep with HEP for neck stretching, VOR adaptation, balance, habituation, and general mobility.   Baseline Target date 09/18/2015 *>30 days due to scheduling   Time 4   Period Weeks   PT LONG TERM GOAL #2   Title The patient will improve Berg score from 38/56 to > or equal to 44/56 to demo decreasing risk for falls.   Time 4   Period Weeks   PT LONG TERM GOAL #3   Title The patient will improve gait speed from 2.47 ft/sec to >or equal to 2.8 ft/sec.   Time 4   Period Weeks   PT LONG TERM GOAL #4   Title The patient will tolerate gaze x 1 viewing x 30 seconds nonstop without dizziness at self regulated pace.   Time 4   Period Weeks   PT LONG TERM GOAL #5   Title The patient will tolerate seated horizontal and vertical head turns x 5 reps without dizziness at self regulated pace.   Time 4   Period Weeks               Plan - 08/11/15 1244    Clinical Impression Statement The patient tolerates HEP well and PT further progressed HEP.  Continue working towards [place].   Rehab Potential Good   PT Frequency 2x / week   PT Duration 4 weeks   PT Treatment/Interventions Balance  training;Neuromuscular re-education;Patient/family education;Gait training;Functional mobility training;Therapeutic activities;Therapeutic exercise;Vestibular;Canalith Repostioning   PT Next Visit Plan Check gaze x 1, neck flexibility, provide habituation, balance ex.   Consulted and Agree with Plan of Care Patient      Patient will benefit from skilled therapeutic intervention in order to improve the following deficits and impairments:  Abnormal gait, Decreased balance,  Dizziness, Decreased mobility  Visit Diagnosis: Other abnormalities of gait and mobility  Dizziness and giddiness     Problem List Patient Active Problem List   Diagnosis Date Noted  . Bone metastases ([place]) 11/22/2014  . Rhinitis, allergic 02/12/2014  . Cancer of right breast, stage 4 ([place]) 01/23/2014  . History of breast cancer 01/11/2014  . Chronic rhinitis 09/17/2013  . Hilar adenopathy 05/12/2013  . Abdominal pain 05/07/2011  . Hypertension 06/09/2010  . BACK PAIN 04/10/2010  . HYPERLIPIDEMIA 03/03/2010  . Cerebrovascular disease 03/03/2010  . DIVERTICULOSIS, COLON 03/03/2010    Juddson Cobern, PT 08/11/2015, 12:46 PM  [facility] [address] [place], [place], [address] Phone: [phone]   Fax:  [phone]  Name: Latecia Miler MRN: [id number] Date of Birth: 01-Jun-1940

## 2015-08-11 NOTE — Patient Instructions (Signed)
Gaze Stabilization: Tip Card 1.Target must remain in focus, not blurry, and appear stationary while head is in motion. 2.Perform exercises with small head movements (45 to either side of midline). 3.Increase speed of head motion so long as target is in focus. 4.If you wear eyeglasses, be sure you can see target through lens (therapist will give specific instructions for bifocal / progressive lenses). 5.These exercises may provoke dizziness or nausea. Work through these symptoms. If too dizzy, slow head movement slightly. Rest between each exercise. 6.Exercises demand concentration; avoid distractions. 7.For safety, perform standing exercises close to a counter, wall, corner, or next to someone.  Copyright  VHI. All rights reserved.  Gaze Stabilization: Standing Feet Apart   Feet shoulder width apart, keeping eyes on target on wall 3 feet away, tilt head down slightly and move head side to side for 30 seconds. Repeat while moving head up and down for 30 seconds. Do 2 sessions per day.   Copyright  VHI. All rights reserved.  Head Motion: Side to Side    Sitting, tilt head down slightly, move head to right with eyes open. Repeat _5___ times per session. Do __2__ sessions per day.  Copyright  VHI. All rights reserved.    Levator Stretch    Grasp seat or sit on hand on side to be stretched. Turn head toward other side and look down. Use hand on head to gently stretch neck in that position. Hold __15__ seconds. Repeat on other side. Repeat __2__ times. Do _2___ sessions per day.  [email or web address].exer.[email or web address]   Copyright  VHI. All rights reserved.   Feet Together, Varied Arm Positions - Eyes Closed    Stand with feet together and arms out. Close eyes and visualize upright position. Hold ____ seconds. Repeat ____ times per session. Do ____ sessions per day.  Copyright  VHI. All rights reserved.  Feet Heel-Toe "Tandem", Varied Arm Positions - Eyes Open    With eyes open, right  foot directly in front of the other, arms at your side, look straight ahead at a stationary object. Hold _30___ seconds. Repeat __2__ times per session. Do __2 sessions per day.  Copyright  VHI. All rights reserved.

## 2015-08-17 ENCOUNTER — Ambulatory Visit: Payer: Medicare Other | Admitting: Rehabilitative and Restorative Service Providers"

## 2015-08-17 DIAGNOSIS — R2689 Other abnormalities of gait and mobility: Secondary | ICD-10-CM

## 2015-08-17 DIAGNOSIS — R42 Dizziness and giddiness: Secondary | ICD-10-CM

## 2015-08-17 NOTE — Therapy (Signed)
[facility] [address] [place], [place], [address] Phone: [phone]   Fax:  [phone]  Physical Therapy Treatment  Patient Details  Name: Erica Young MRN: [id number] Date of Birth: [birth year] Referring Provider: Bluford Kaufmann, MD  Encounter Date: 08/17/2015      PT End of Session - 08/17/15 0944    Visit Number 3   Number of Visits 8   Date for PT Re-Evaluation 09/18/15   Authorization Type G code every 10th visit   PT Start Time 0803   PT Stop Time 0846   PT Time Calculation (min) 43 min   Equipment Utilized During Treatment Gait belt   Activity Tolerance Patient tolerated treatment well   Behavior During Therapy [facility] for tasks assessed/performed      Past Medical History  Diagnosis Date  . HYPERLIPIDEMIA 03/03/2010  . CEREBROVASCULAR DISEASE 03/03/2010  . DIVERTICULOSIS, COLON 03/03/2010  . Low back pain   . Right leg pain   . History of cerebral artery stenosis     right middle  . Mastoiditis     noted on brain MRI  . Foramen ovale     positive bubble study  . Diverticulitis   . Fatty liver 08/02/09    as per U/S done by [facility]  . Internal hemorrhoid   . Anxiety   . Hypertension   . Ulcer   . Stroke [facility]) Oct. 2011    TIA  . HYPOTHYROIDISM 03/03/2010    no longer on meds  . Shortness of breath   . Headache(784.0)     she thinks sinus headaches  . Rib fracture   . Cancer [facility]) 2007    right breat ca  , lumpectomy and radiation tx (declined chemo and additional prophylactic meds)  . Bone cancer ([place]) mri 11/11/14    right parietal bone,left cribriform plate metastases  . Allergy     Past Surgical History  Procedure Laterality Date  . Cholecystectomy    . Breast lumpectomy      right  . Tonsillectomy    . Colonoscopy    . Tubal ligation    . Shoulder surgery      right shoulder (dislocation)  . Video bronchoscopy with endobronchial ultrasound N/A 01/07/2014   Procedure: VIDEO BRONCHOSCOPY WITH ENDOBRONCHIAL ULTRASOUND;  Surgeon: Ivin Poot, MD;  Location: [facility] OR;  Service: Thoracic;  Laterality: N/A;    There were no vitals filed for this visit.      Subjective Assessment - 08/17/15 0804    Subjective One of the exercises did not feel clear for the patient.            [facility] PT Assessment - 08/17/15 0828    Standardized Balance Assessment   Standardized Balance Assessment Berg Balance Test   Berg Balance Test   Sit to Stand Able to stand without using hands and stabilize independently   Standing Unsupported Able to stand safely 2 minutes   Sitting with Back Unsupported but Feet Supported on Floor or Stool Able to sit safely and securely 2 minutes   Stand to Sit Sits safely with minimal use of hands   Transfers Able to transfer safely, minor use of hands   Standing Unsupported with Eyes Closed Able to stand 10 seconds safely   Standing Ubsupported with Feet Together Able to place feet together independently and stand 1 minute safely   From Standing, Reach Forward with Outstretched Arm Can reach confidently >25 cm (10")  From Standing Position, Pick up Object from [place] to pick up shoe safely and easily   From Standing Position, Turn to Look Behind Over each Shoulder Looks behind from both sides and weight shifts well   Turn 360 Degrees Able to turn 360 degrees safely in 4 seconds or less   Standing Unsupported, Alternately Place Feet on Step/Stool Able to stand independently and safely and complete 8 steps in 20 seconds   Standing Unsupported, One Foot in [place] to plae foot ahead of the other independently and hold 30 seconds   Standing on One Leg Tries to lift leg/unable to hold 3 seconds but remains standing independently   Total Score 52   Berg comment: 52/56            [facility] Adult PT Treatment/Exercise - 08/17/15 0843    Ambulation/Gait   Ambulation/Gait Yes   Ambulation/Gait Assistance 7: Independent    Ambulation Distance (Feet) 300 Feet   Assistive device None   Ambulation Surface Level   Gait velocity 3.52 ft/sec   Neuro Re-ed    Neuro Re-ed Details  Reviewed standing partial heel/toe and feet together with eyes closed, performed tandem stance activities.         Vestibular Treatment/Exercise - 08/17/15 0845    Vestibular Treatment/Exercise   Vestibular Treatment Provided Gaze;Habituation   Habituation Exercises Seated Horizontal Head Turns   Gaze Exercises X1 Viewing Horizontal;X1 Viewing Vertical   Seated Horizontal Head Turns   Number of Reps  5   Symptom Description  reviewed HEP with corrections ot written information to improve patient understanding   Seated Vertical Head Turns   Symptom Description  no dizziness   X1 Viewing Horizontal   Foot Position standing feet apart and partial heel/toe   Reps 3  x 30 seconds   Comments tolerates without dizziness today, progressed to partial heel/toe and after multiple repetitions reported mild symptoms   X1 Viewing Vertical   Foot Position standing feet apart   Reps 3  x 30 seconds   Comments tolerated without difficulty          PT Education - 08/17/15 0938    Education provided Yes   Education Details HEP: Reviewed all HEP multiple times in order to ensure understanding.   Person(s) Educated Patient   Methods Explanation;Demonstration;Handout   Comprehension Verbalized understanding;Returned demonstration;Verbal cues required          PT Long Term Goals - 08/17/15 0539    PT LONG TERM GOAL #1   Title The patient will be indep with HEP for neck stretching, VOR adaptation, balance, habituation, and general mobility.   Baseline Target date 09/18/2015 *>30 days due to scheduling   Time 4   Period Weeks   Status On-going   PT LONG TERM GOAL #2   Title The patient will improve Berg score from 38/56 to > or equal to 44/56 to demo decreasing risk for falls.   Baseline Improved to 52/56   Time 4   Period Weeks    Status Achieved   PT LONG TERM GOAL #3   Title The patient will improve gait speed from 2.47 ft/sec to >or equal to 2.8 ft/sec.   Baseline 3.52 ft/sec on 08/17/2015   Time 4   Period Weeks   Status Achieved   PT LONG TERM GOAL #4   Title The patient will tolerate gaze x 1 viewing x 30 seconds nonstop without dizziness at self regulated pace.   Baseline Patient demonstrates  30 seconds without dizziness.    Time 4   Period Weeks   Status Achieved   PT LONG TERM GOAL #5   Title The patient will tolerate seated horizontal and vertical head turns x 5 reps without dizziness at self regulated pace.   Baseline Met on 08/17/2015   Time 4   Period Weeks   Status Achieved               Plan - 08/17/15 0944    Clinical Impression Statement The patient has met 4/5 LTGs in PT.  We reviewed all HEP and reduced frequency to 1x/ week for next week to recheck HEP and progress as tolerated.  Patient feels symptoms are improving and she is functioning well.    Rehab Potential Good   PT Treatment/Interventions Balance training;Neuromuscular re-education;Patient/family education;Gait training;Functional mobility training;Therapeutic activities;Therapeutic exercise;Vestibular;Canalith Repostioning   PT Next Visit Plan Check HEP, discharge if doing well.   Consulted and Agree with Plan of Care Patient      Patient will benefit from skilled therapeutic intervention in order to improve the following deficits and impairments:  Abnormal gait, Decreased balance, Dizziness, Decreased mobility  Visit Diagnosis: Other abnormalities of gait and mobility  Dizziness and giddiness     Problem List Patient Active Problem List   Diagnosis Date Noted  . Bone metastases ([place]) 11/22/2014  . Rhinitis, allergic 02/12/2014  . Cancer of right breast, stage 4 ([place]) 01/23/2014  . History of breast cancer 01/11/2014  . Chronic rhinitis 09/17/2013  . Hilar adenopathy 05/12/2013  . Abdominal pain 05/07/2011  .  Hypertension 06/09/2010  . BACK PAIN 04/10/2010  . HYPERLIPIDEMIA 03/03/2010  . Cerebrovascular disease 03/03/2010  . DIVERTICULOSIS, COLON 03/03/2010    Dorthy Magnussen, PT 08/17/2015, 9:45 AM  [facility] [address] [place], [place], [address] Phone: [phone]   Fax:  [phone]  Name: Erica Young MRN: [id number] Date of Birth: 1940-10-22

## 2015-08-17 NOTE — Patient Instructions (Signed)
Gaze Stabilization: Standing Feet Apart   Feet shoulder width apart, keeping eyes on target on wall 3 feet away, tilt head down slightly and move head side to side for 30 seconds. Repeat while moving head up and down for 30 seconds. Do 2 sessions per day.   Copyright  VHI. All rights reserved.  Head Motion: Side to Side    Sitting, tilt head down slightly, move head to right with eyes open and then to the left. Repeat _5___ times per session. Do __2__ sessions per day.  Copyright  VHI. All rights reserved.    Levator Stretch    Grasp seat or sit on hand on side to be stretched. Turn head toward other side and look down. Use hand on head to gently stretch neck in that position. Hold __15__ seconds. Repeat on other side. Repeat __2__ times. Do _2___ sessions per day.  http://gt2.exer.us/31   Copyright  VHI. All rights reserved.   Feet Together, Varied Arm Positions - Eyes Closed    Stand with feet together and arms out. Close eyes and visualize upright position. Hold ____ seconds. Repeat ____ times per session. Do ____ sessions per day.  Copyright  VHI. All rights reserved.  Feet Heel-Toe "Tandem", Varied Arm Positions - Eyes Open    With eyes open, right foot directly in front of the other, arms at your side, look straight ahead at a stationary object. Hold _30___ seconds. Repeat __2__ times per session. Do __2 sessions per day.  Copyright  VHI. All rights reserved.    [place], PT4/13/2017 12:48 PM [facility] [address] [place], [place], [address] Phone: [phone] Fax: [phone]

## 2015-08-19 ENCOUNTER — Encounter: Payer: Medicare Other | Admitting: Rehabilitative and Restorative Service Providers"

## 2015-08-23 ENCOUNTER — Encounter: Payer: Self-pay | Admitting: Internal Medicine

## 2015-08-23 ENCOUNTER — Ambulatory Visit (INDEPENDENT_AMBULATORY_CARE_PROVIDER_SITE_OTHER): Payer: Medicare Other | Admitting: Internal Medicine

## 2015-08-23 VITALS — BP 130/80 | HR 77 | Temp 98.2°F | Resp 20 | Ht 62.0 in | Wt 137.0 lb

## 2015-08-23 DIAGNOSIS — C50911 Malignant neoplasm of unspecified site of right female breast: Secondary | ICD-10-CM

## 2015-08-23 DIAGNOSIS — E039 Hypothyroidism, unspecified: Secondary | ICD-10-CM | POA: Diagnosis not present

## 2015-08-23 DIAGNOSIS — E785 Hyperlipidemia, unspecified: Secondary | ICD-10-CM | POA: Diagnosis not present

## 2015-08-23 DIAGNOSIS — Z853 Personal history of malignant neoplasm of breast: Secondary | ICD-10-CM

## 2015-08-23 DIAGNOSIS — I10 Essential (primary) hypertension: Secondary | ICD-10-CM | POA: Diagnosis not present

## 2015-08-23 LAB — LIPID PANEL
CHOL/HDL RATIO: 3
Cholesterol: 157 mg/dL (ref 0–200)
HDL: 57.1 mg/dL (ref >39.00)
LDL CALC: 69 mg/dL (ref 0–99)
NONHDL: 99.46
Triglycerides: 150 mg/dL — ABNORMAL HIGH (ref 0.0–149.0)
VLDL: 30 mg/dL (ref 0.0–40.0)

## 2015-08-23 LAB — TSH: TSH: 3.53 u[IU]/mL (ref 0.35–4.50)

## 2015-08-23 NOTE — Patient Instructions (Signed)
Return in 6 months for follow-up  Limit your sodium (Salt) intake .  It is important that you exercise regularly, at least 20 minutes 3 to 4 times per week.  If you develop chest pain or shortness of breath seek  medical attention.  Please check your blood pressure on a regular basis.  If it is consistently greater than 150/90, please make an office appointment.

## 2015-08-23 NOTE — Progress Notes (Signed)
Pre visit review using our clinic review tool, if applicable. No additional management support is needed unless otherwise documented below in the visit note. 

## 2015-08-23 NOTE — Progress Notes (Signed)
Subjective:    Patient ID: Erica Young, female    DOB: 05/15/1940, 75 y.o.   MRN: [id number]  HPI  75 year old patient who is followed closely by oncology with stage IV metastatic breast cancer.  She was seen by ENT in July 2016 due to hearing loss, a brain MRI revealed metastatic skeletal lesions and the patient was treated with radiotherapy.  She states that her hearing has improved She has essential hypertension and dyslipidemia. In general doing fairly well.  No constitutional complaints  Wt Readings from Last 3 Encounters:  08/23/15 137 lb (62.143 kg)  07/05/15 136 lb 6.4 oz (61.871 kg)  04/15/15 134 lb 1.6 oz (60.827 kg)    Past Medical History  Diagnosis Date  . HYPERLIPIDEMIA 03/03/2010  . CEREBROVASCULAR DISEASE 03/03/2010  . DIVERTICULOSIS, COLON 03/03/2010  . Low back pain   . Right leg pain   . History of cerebral artery stenosis     right middle  . Mastoiditis     noted on brain MRI  . Foramen ovale     positive bubble study  . Diverticulitis   . Fatty liver 08/02/09    as per U/S done by [facility]  . Internal hemorrhoid   . Anxiety   . Hypertension   . Ulcer   . Stroke [facility]) Oct. 2011    TIA  . HYPOTHYROIDISM 03/03/2010    no longer on meds  . Shortness of breath   . Headache(784.0)     she thinks sinus headaches  . Rib fracture   . Cancer [facility]) 2007    right breat ca  , lumpectomy and radiation tx (declined chemo and additional prophylactic meds)  . Bone cancer ([place]) mri 11/11/14    right parietal bone,left cribriform plate metastases  . Allergy      Social History   Social History  . Marital Status: Married    Spouse Name: N/A  . Number of Children: 2  . Years of Education: N/A   Occupational History  . Social Worker--Retired    Social History Main Topics  . Smoking status: Former Smoker -- 0.50 packs/day for 20 years    Types: Cigarettes    Quit date: 05/01/1991  . Smokeless tobacco: Never Used  . Alcohol Use: No  . Drug  Use: No  . Sexual Activity: Yes   Other Topics Concern  . Not on file   Social History Narrative   Daily caffeine     Past Surgical History  Procedure Laterality Date  . Cholecystectomy    . Breast lumpectomy      right  . Tonsillectomy    . Colonoscopy    . Tubal ligation    . Shoulder surgery      right shoulder (dislocation)  . Video bronchoscopy with endobronchial ultrasound N/A 01/07/2014    Procedure: VIDEO BRONCHOSCOPY WITH ENDOBRONCHIAL ULTRASOUND;  Surgeon: Ivin Poot, MD;  [facility] OR;  Service: Thoracic;  Laterality: N/A;    Family History  Problem Relation Age of Onset  . Heart disease Sister     cerebral vascular disease also  . Heart disease Brother     cerebral vascular disease also  . Heart disease Brother     cerebral vascular disease  . Heart disease Sister     cebreal vascular disease also  . Heart disease Sister     cebreal vascular diease also  . Heart disease Sister     cerebral vascular diease also  .  Colon cancer Neg Hx   . Esophageal cancer Neg Hx   . Rectal cancer Neg Hx   . Stomach cancer Father     Allergies  Allergen Reactions  . Ciprofloxacin Anaphylaxis    Current Outpatient Prescriptions on File Prior to Visit  Medication Sig Dispense Refill  . ALPRAZolam (XANAX) 0.25 MG tablet TAKE 1 TABLET BY MOUTH THREE TIMES DAILY AS NEEDED FOR ANXIETY 90 tablet 2  . anastrozole (ARIMIDEX) 1 MG tablet Take 1 tablet (1 mg total) by mouth daily. 30 tablet 3  . aspirin EC 81 MG tablet Take 81 mg by mouth daily.    . Calcium-Vitamin D (CALTRATE 600 PLUS-VIT D PO) Take 1 tablet by mouth 2 (two) times daily.     Marland Kitchen CARTIA XT 120 MG 24 hr capsule TAKE 1 CAPSULE BY MOUTH DAILY 90 capsule 1  . cetirizine (ZYRTEC) 10 MG tablet Take 10 mg by mouth daily as needed.     . Cyanocobalamin (VITAMIN B 12 PO) Take 100 mcg by mouth daily.    Marland Kitchen denosumab (XGEVA) 120 MG/1.7ML SOLN injection Inject 120 mg into the skin every 30 (thirty) days.    .  meclizine (ANTIVERT) 25 MG tablet Take 1 tablet (25 mg total) by mouth every 6 (six) hours as needed for dizziness. 60 tablet 3  . Melatonin 10 MG TABS Take 1 tablet by mouth daily.    . Multiple Vitamin (MULTIVITAMIN WITH MINERALS) TABS tablet Take 1 tablet by mouth daily.    Marland Kitchen omeprazole (PRILOSEC) 40 MG capsule Take 1 capsule (40 mg total) by mouth daily. 60 capsule 0  . Probiotic Product (PROBIOTIC DAILY PO) Take 1 capsule by mouth once. Takes 1 a day    . simvastatin (ZOCOR) 5 MG tablet TAKE 1 TABLET BY MOUTH DAILY 90 tablet 1   No current facility-administered medications on file prior to visit.    BP 130/80 mmHg  Pulse 77  Temp(Src) 98.2 F (36.8 C) (Oral)  Resp 20  Ht '5\' 2"'$  (1.575 m)  Wt 137 lb (62.143 kg)  BMI 25.05 kg/m2  SpO2 96%     Review of Systems  Constitutional: Negative.   HENT: Positive for ear pain and hearing loss. Negative for congestion, dental problem, rhinorrhea, sinus pressure, sore throat and tinnitus.   Eyes: Negative for pain, discharge and visual disturbance.  Respiratory: Negative for cough and shortness of breath.   Cardiovascular: Negative for chest pain, palpitations and leg swelling.  Gastrointestinal: Negative for nausea, vomiting, abdominal pain, diarrhea, constipation, blood in stool and abdominal distention.  Genitourinary: Negative for dysuria, urgency, frequency, hematuria, flank pain, vaginal bleeding, vaginal discharge, difficulty urinating, vaginal pain and pelvic pain.  Musculoskeletal: Negative for joint swelling, arthralgias and gait problem.  Skin: Negative for rash.  Neurological: Negative for dizziness, syncope, speech difficulty, weakness, numbness and headaches.  Hematological: Negative for adenopathy.  Psychiatric/Behavioral: Negative for behavioral problems, dysphoric mood and agitation. The patient is nervous/anxious.        Objective:   Physical Exam  Constitutional: She is oriented to person, place, and time. She  appears well-developed and well-nourished.  HENT:  Head: Normocephalic.  Right Ear: External ear normal.  Left Ear: External ear normal.  Mouth/Throat: Oropharynx is clear and moist.  Eyes: Conjunctivae and EOM are normal. Pupils are equal, round, and reactive to light.  Neck: Normal range of motion. Neck supple. No thyromegaly present.  Cardiovascular: Normal rate, regular rhythm, normal heart sounds and intact distal pulses.   Pulmonary/Chest:  Effort normal and breath sounds normal.  Abdominal: Soft. Bowel sounds are normal. She exhibits no mass. There is no tenderness.  Musculoskeletal: Normal range of motion.  Lymphadenopathy:    She has no cervical adenopathy.  Neurological: She is alert and oriented to person, place, and time.  Skin: Skin is warm and dry. No rash noted.  Psychiatric: She has a normal mood and affect. Her behavior is normal.  Somewhat depressed affect          Assessment & Plan:   Essential hypertension, stable Dyslipidemia.  Continue statin therapy Metastatic breast cancer.  Follow-up oncology  Return here 6 months or as needed

## 2015-08-24 ENCOUNTER — Encounter: Payer: Medicare Other | Admitting: Rehabilitative and Restorative Service Providers"

## 2015-08-26 ENCOUNTER — Encounter: Payer: Self-pay | Admitting: Rehabilitative and Restorative Service Providers"

## 2015-08-26 ENCOUNTER — Ambulatory Visit: Payer: Medicare Other | Admitting: Rehabilitative and Restorative Service Providers"

## 2015-08-26 ENCOUNTER — Ambulatory Visit: Payer: Medicare Other | Admitting: Internal Medicine

## 2015-08-26 DIAGNOSIS — R42 Dizziness and giddiness: Secondary | ICD-10-CM

## 2015-08-26 DIAGNOSIS — R2689 Other abnormalities of gait and mobility: Secondary | ICD-10-CM | POA: Diagnosis not present

## 2015-08-26 NOTE — Patient Instructions (Signed)
SINGLE LIMB STANCE    Stance: single leg on floor. Raise leg. Hold __10-15_ seconds. Repeat with other leg. __3_ reps per set, _1-2__ sets per day.  Copyright  VHI. All rights reserved.

## 2015-08-26 NOTE — Therapy (Signed)
[facility] [address] [place], [place], [address] Phone: [phone]   Fax:  [phone]  Physical Therapy Treatment  Patient Details  Name: Erica Young MRN: [id number] Date of Birth: 1941/04/28 Referring Provider: Bluford Kaufmann, MD  Encounter Date: 08/26/2015      PT End of Session - 08/26/15 0849    Visit Number 4   Number of Visits 8   Date for PT Re-Evaluation 09/18/15   Authorization Type G code every 10th visit   PT Start Time 0845   PT Stop Time 0918   PT Time Calculation (min) 33 min   Equipment Utilized During Treatment --   Activity Tolerance Patient tolerated treatment well   Behavior During Therapy [facility] for tasks assessed/performed      Past Medical History  Diagnosis Date  . HYPERLIPIDEMIA 03/03/2010  . CEREBROVASCULAR DISEASE 03/03/2010  . DIVERTICULOSIS, COLON 03/03/2010  . Low back pain   . Right leg pain   . History of cerebral artery stenosis     right middle  . Mastoiditis     noted on brain MRI  . Foramen ovale     positive bubble study  . Diverticulitis   . Fatty liver 08/02/09    as per U/S done by [facility]  . Internal hemorrhoid   . Anxiety   . Hypertension   . Ulcer   . Stroke [facility]) Oct. 2011    TIA  . HYPOTHYROIDISM 03/03/2010    no longer on meds  . Shortness of breath   . Headache(784.0)     she thinks sinus headaches  . Rib fracture   . Cancer [facility]) 2007    right breat ca  , lumpectomy and radiation tx (declined chemo and additional prophylactic meds)  . Bone cancer ([place]) mri 11/11/14    right parietal bone,left cribriform plate metastases  . Allergy     Past Surgical History  Procedure Laterality Date  . Cholecystectomy    . Breast lumpectomy      right  . Tonsillectomy    . Colonoscopy    . Tubal ligation    . Shoulder surgery      right shoulder (dislocation)  . Video bronchoscopy with endobronchial ultrasound N/A 01/07/2014    Procedure:  VIDEO BRONCHOSCOPY WITH ENDOBRONCHIAL ULTRASOUND;  Surgeon: Ivin Poot, MD;  Location: [facility] OR;  Service: Thoracic;  Laterality: N/A;    There were no vitals filed for this visit.      Subjective Assessment - 08/26/15 0843    Subjective The patient reports that when she gets sore in her neck she is not sure which exercise aggravates it.  She reports she did exercises 1x/day.  She had one episode of head feeling bad with ear feeling funny.  She reports "no vertigo."   Pertinent History Cancer (currently undergoing treatment)-xgeva; metastatic bone disease   Patient Stated Goals "I don't want to get vertigo anymore."   Currently in Pain? No/denies                         [facility] Adult PT Treatment/Exercise - 08/26/15 0858    Self-Care   Self-Care Other Self-Care Comments   Other Self-Care Comments  The patient has HEP and PT reviewed ways to continue to increase movement to her tolerance.  She reports when she walks for longer distances, she experiences greater night pain in bones due to bone cancer.  PT recommended she only  perform activities to tolerance and discussed other alternatives like walking in water or swimming that may help her stay active without increasing pounding through joints/bones.  PT emphasized continuation of balance and neck ROM exercises in order to prevent guarding and moving en bloc during functional activities.    Neuro Re-ed    Neuro Re-ed Details  PT reviewed all HEP and patient was able to return demo with minimal cues.  Exercises reviewed are gaze, tandem stance, eyes closed with feet together, seated horizontal head turns for habituation and levator scapula stretch.    Exercises   Exercises Neck  reviewed neck stretching                PT Education - 09/07/2015 0905    Education provided Yes   Education Details HEP: Reviewed all HEP and added single limb stance.   Person(s) Educated Patient   Methods Explanation;Demonstration;Handout    Comprehension Returned demonstration;Verbalized understanding             PT Long Term Goals - 2015/09/07 0901    PT LONG TERM GOAL #1   Title The patient will be indep with HEP for neck stretching, VOR adaptation, balance, habituation, and general mobility.   Baseline Met on 09-07-2015 with patient performing.   Time 4   Period Weeks   Status Achieved   PT LONG TERM GOAL #2   Title The patient will improve Berg score from 38/56 to > or equal to 44/56 to demo decreasing risk for falls.   Baseline Improved to 52/56   Time 4   Period Weeks   Status Achieved   PT LONG TERM GOAL #3   Title The patient will improve gait speed from 2.47 ft/sec to >or equal to 2.8 ft/sec.   Baseline 3.52 ft/sec on 08/17/2015   Time 4   Period Weeks   Status Achieved   PT LONG TERM GOAL #4   Title The patient will tolerate gaze x 1 viewing x 30 seconds nonstop without dizziness at self regulated pace.   Baseline Patient demonstrates 30 seconds without dizziness.    Time 4   Period Weeks   Status Achieved   PT LONG TERM GOAL #5   Title The patient will tolerate seated horizontal and vertical head turns x 5 reps without dizziness at self regulated pace.   Baseline Met on 08/17/2015   Time 4   Period Weeks   Status Achieved               Plan - 09/07/2015 9038    Clinical Impression Statement The patient has met LTGs in physical therapy.  PT has encouraged reduced neck/head guarding due to h/o vertigo, emphasized balance and habituation exercises to improve mobility and gaze.  The patient inquires about mgmt of ear pain for which PT has educated her to further discuss with MD if needed.  Also, recommended she continue mobility/activities to tolerance.   PT Treatment/Interventions Balance training;Neuromuscular re-education;Patient/family education;Gait training;Functional mobility training;Therapeutic activities;Therapeutic exercise;Vestibular;Canalith Repostioning   PT Next Visit Plan Discharge    Consulted and Agree with Plan of Care Patient      Patient will benefit from skilled therapeutic intervention in order to improve the following deficits and impairments:  Abnormal gait, Decreased balance, Dizziness, Decreased mobility  Visit Diagnosis: Other abnormalities of gait and mobility  Dizziness and giddiness       G-Codes - 09-07-15 0922    Functional Assessment Tool Used Berg=52/56   Functional Limitation Mobility: Walking  and moving around   Mobility: Walking and Moving Around Goal Status [phone]) At least 1 percent but less than 20 percent impaired, limited or restricted   Mobility: Walking and Moving Around Discharge Status [phone]) At least 1 percent but less than 20 percent impaired, limited or restricted      Problem List Patient Active Problem List   Diagnosis Date Noted  . Bone metastases ([place]) 11/22/2014  . Rhinitis, allergic 02/12/2014  . Cancer of right breast, stage 4 ([place]) 01/23/2014  . History of breast cancer 01/11/2014  . Chronic rhinitis 09/17/2013  . Hilar adenopathy 05/12/2013  . Abdominal pain 05/07/2011  . Hypertension 06/09/2010  . BACK PAIN 04/10/2010  . Dyslipidemia 03/03/2010  . Cerebrovascular disease 03/03/2010  . DIVERTICULOSIS, COLON 03/03/2010    Muriel Wilber, PT 08/26/2015, 9:23 AM  [facility] [address] [place], [place], [address] Phone: [phone]   Fax:  [phone]  Name: Tinisha Etzkorn MRN: [id number] Date of Birth: 1940/08/29

## 2015-08-26 NOTE — Therapy (Signed)
[facility] [address] [place], [place], [address] Phone: [phone]   Fax:  [phone]  Patient Details  Name: Erica Young MRN: [id number] Date of Birth: [birth year] Referring Provider:  No ref. provider found  Encounter Date: 2015/09/21  PHYSICAL THERAPY DISCHARGE SUMMARY  Visits from Start of Care: 4  Current functional level related to goals / functional outcomes:     PT Long Term Goals - 2015/09/21 0901    PT LONG TERM GOAL #1   Title The patient will be indep with HEP for neck stretching, VOR adaptation, balance, habituation, and general mobility.   Baseline Met on 2015/09/21 with patient performing.   Time 4   Period Weeks   Status Achieved   PT LONG TERM GOAL #2   Title The patient will improve Berg score from 38/56 to > or equal to 44/56 to demo decreasing risk for falls.   Baseline Improved to 52/56   Time 4   Period Weeks   Status Achieved   PT LONG TERM GOAL #3   Title The patient will improve gait speed from 2.47 ft/sec to >or equal to 2.8 ft/sec.   Baseline 3.52 ft/sec on 08/17/2015   Time 4   Period Weeks   Status Achieved   PT LONG TERM GOAL #4   Title The patient will tolerate gaze x 1 viewing x 30 seconds nonstop without dizziness at self regulated pace.   Baseline Patient demonstrates 30 seconds without dizziness.    Time 4   Period Weeks   Status Achieved   PT LONG TERM GOAL #5   Title The patient will tolerate seated horizontal and vertical head turns x 5 reps without dizziness at self regulated pace.   Baseline Met on 08/17/2015   Time 4   Period Weeks   Status Achieved        Remaining deficits: Ear pain for which PT has educated patient is not in our scope to address.  H/o intermittent vertigo with patient limiting return to some activities due to fear of return of symptoms.   Education / Equipment: HEP, neck stretching, general mobilty.  Plan: Patient agrees to discharge.   Patient goals were met. Patient is being discharged due to meeting the stated rehab goals.  ?????               G-Codes - 2015-09-21 5038    Functional Assessment Tool Used Berg=52/56   Functional Limitation Mobility: Walking and moving around   Mobility: Walking and Moving Around Goal Status [phone]) At least 1 percent but less than 20 percent impaired, limited or restricted   Mobility: Walking and Moving Around Discharge Status [phone]) At least 1 percent but less than 20 percent impaired, limited or restricted      Thank you for the referral of this patient. Rudell Cobb, MPT   [place] 09-21-15, 9:23 AM  [facility] [address] [place], [place], [address] Phone: [phone]   Fax:  [phone]

## 2015-08-29 ENCOUNTER — Other Ambulatory Visit: Payer: Self-pay | Admitting: *Deleted

## 2015-08-30 ENCOUNTER — Ambulatory Visit (HOSPITAL_BASED_OUTPATIENT_CLINIC_OR_DEPARTMENT_OTHER): Payer: Medicare Other

## 2015-08-30 ENCOUNTER — Other Ambulatory Visit (HOSPITAL_BASED_OUTPATIENT_CLINIC_OR_DEPARTMENT_OTHER): Payer: Medicare Other

## 2015-08-30 VITALS — BP 142/67 | HR 68 | Temp 97.6°F

## 2015-08-30 DIAGNOSIS — C7951 Secondary malignant neoplasm of bone: Secondary | ICD-10-CM | POA: Diagnosis not present

## 2015-08-30 DIAGNOSIS — C50919 Malignant neoplasm of unspecified site of unspecified female breast: Secondary | ICD-10-CM | POA: Diagnosis present

## 2015-08-30 DIAGNOSIS — C781 Secondary malignant neoplasm of mediastinum: Secondary | ICD-10-CM | POA: Diagnosis not present

## 2015-08-30 DIAGNOSIS — C50911 Malignant neoplasm of unspecified site of right female breast: Secondary | ICD-10-CM | POA: Diagnosis not present

## 2015-08-30 DIAGNOSIS — Z853 Personal history of malignant neoplasm of breast: Secondary | ICD-10-CM

## 2015-08-30 LAB — CBC WITH DIFFERENTIAL/PLATELET
BASO%: 0.4 % (ref 0.0–2.0)
Basophils Absolute: 0 10*3/uL (ref 0.0–0.1)
EOS ABS: 0.4 10*3/uL (ref 0.0–0.5)
EOS%: 6.6 % (ref 0.0–7.0)
HEMATOCRIT: 41.7 % (ref 34.8–46.6)
HEMOGLOBIN: 14.2 g/dL (ref 11.6–15.9)
LYMPH#: 1.9 10*3/uL (ref 0.9–3.3)
LYMPH%: 35.9 % (ref 14.0–49.7)
MCH: 32.6 pg (ref 25.1–34.0)
MCHC: 34.1 g/dL (ref 31.5–36.0)
MCV: 95.6 fL (ref 79.5–101.0)
MONO#: 0.4 10*3/uL (ref 0.1–0.9)
MONO%: 7.8 % (ref 0.0–14.0)
NEUT%: 49.3 % (ref 38.4–76.8)
NEUTROS ABS: 2.6 10*3/uL (ref 1.5–6.5)
Platelets: 148 10*3/uL (ref 145–400)
RBC: 4.36 10*6/uL (ref 3.70–5.45)
RDW: 12.6 % (ref 11.2–14.5)
WBC: 5.3 10*3/uL (ref 3.9–10.3)

## 2015-08-30 LAB — COMPREHENSIVE METABOLIC PANEL
ALBUMIN: 3.7 g/dL (ref 3.5–5.0)
ALK PHOS: 53 U/L (ref 40–150)
ALT: 19 U/L (ref 0–55)
AST: 18 U/L (ref 5–34)
Anion Gap: 10 mEq/L (ref 3–11)
BILIRUBIN TOTAL: 0.49 mg/dL (ref 0.20–1.20)
BUN: 15.4 mg/dL (ref 7.0–26.0)
CO2: 25 mEq/L (ref 22–29)
Calcium: 9 mg/dL (ref 8.4–10.4)
Chloride: 106 mEq/L (ref 98–109)
Creatinine: 0.9 mg/dL (ref 0.6–1.1)
EGFR: 65 mL/min/{1.73_m2} — AB (ref >90)
GLUCOSE: 126 mg/dL (ref 70–140)
Potassium: 3.9 mEq/L (ref 3.5–5.1)
SODIUM: 140 meq/L (ref 136–145)
TOTAL PROTEIN: 6.9 g/dL (ref 6.4–8.3)

## 2015-08-30 MED ORDER — DENOSUMAB 120 MG/1.7ML ~~LOC~~ SOLN
120.0000 mg | Freq: Once | SUBCUTANEOUS | Status: AC
  Administered 2015-08-30: 120 mg via SUBCUTANEOUS
  Filled 2015-08-30: qty 1.7

## 2015-08-30 MED ORDER — ALBUTEROL SULFATE (2.5 MG/3ML) 0.083% IN NEBU
2.5000 mg | INHALATION_SOLUTION | Freq: Once | RESPIRATORY_TRACT | Status: DC | PRN
  Filled 2015-08-30: qty 3

## 2015-08-30 MED ORDER — METHYLPREDNISOLONE SODIUM SUCC 125 MG IJ SOLR: 125.0000 mg | Freq: Once | INTRAMUSCULAR | Status: DC | PRN

## 2015-08-30 MED ORDER — DIPHENHYDRAMINE HCL 50 MG/ML IJ SOLN: 50.0000 mg | Freq: Once | INTRAMUSCULAR | Status: DC | PRN

## 2015-08-30 MED ORDER — DIPHENHYDRAMINE HCL 50 MG/ML IJ SOLN: 25.0000 mg | Freq: Once | INTRAMUSCULAR | Status: DC | PRN

## 2015-08-30 MED ORDER — SODIUM CHLORIDE 0.9 % IV SOLN: Freq: Once | INTRAVENOUS | Status: DC | PRN

## 2015-08-31 LAB — CANCER ANTIGEN 27.29: CA 27.29: 24.3 U/mL (ref 0.0–38.6)

## 2015-08-31 LAB — CANCER ANTIGEN 27-29 (PARALLEL TESTING): CA 27.29: 25 U/mL (ref <38)

## 2015-09-02 ENCOUNTER — Encounter: Payer: Medicare Other | Admitting: Rehabilitative and Restorative Service Providers"

## 2015-09-07 ENCOUNTER — Other Ambulatory Visit: Payer: Self-pay | Admitting: Internal Medicine

## 2015-09-22 ENCOUNTER — Other Ambulatory Visit: Payer: Self-pay | Admitting: Internal Medicine

## 2015-09-29 ENCOUNTER — Other Ambulatory Visit: Payer: Self-pay | Admitting: Internal Medicine

## 2015-10-03 ENCOUNTER — Encounter (HOSPITAL_COMMUNITY): Payer: Self-pay

## 2015-10-03 ENCOUNTER — Other Ambulatory Visit: Payer: Self-pay | Admitting: Medical Oncology

## 2015-10-03 ENCOUNTER — Ambulatory Visit (HOSPITAL_COMMUNITY)
Admission: RE | Admit: 2015-10-03 | Discharge: 2015-10-03 | Disposition: A | Discharge status: Home / Self Care | Payer: Medicare Other | Source: Ambulatory Visit | Attending: Internal Medicine | Admitting: Internal Medicine

## 2015-10-03 DIAGNOSIS — C7951 Secondary malignant neoplasm of bone: Secondary | ICD-10-CM | POA: Insufficient documentation

## 2015-10-03 DIAGNOSIS — R109 Unspecified abdominal pain: Secondary | ICD-10-CM | POA: Diagnosis not present

## 2015-10-03 DIAGNOSIS — I2510 Atherosclerotic heart disease of native coronary artery without angina pectoris: Secondary | ICD-10-CM | POA: Insufficient documentation

## 2015-10-03 DIAGNOSIS — C7800 Secondary malignant neoplasm of unspecified lung: Secondary | ICD-10-CM | POA: Diagnosis not present

## 2015-10-03 DIAGNOSIS — K5730 Diverticulosis of large intestine without perforation or abscess without bleeding: Secondary | ICD-10-CM | POA: Insufficient documentation

## 2015-10-03 DIAGNOSIS — R0602 Shortness of breath: Secondary | ICD-10-CM | POA: Diagnosis not present

## 2015-10-03 DIAGNOSIS — R911 Solitary pulmonary nodule: Secondary | ICD-10-CM | POA: Diagnosis not present

## 2015-10-03 DIAGNOSIS — C50911 Malignant neoplasm of unspecified site of right female breast: Secondary | ICD-10-CM | POA: Diagnosis not present

## 2015-10-03 MED ORDER — IOPAMIDOL (ISOVUE-300) INJECTION 61%
100.0000 mL | Freq: Once | INTRAVENOUS | Status: AC | PRN
  Administered 2015-10-03: 100 mL via INTRAVENOUS

## 2015-10-04 ENCOUNTER — Other Ambulatory Visit (HOSPITAL_BASED_OUTPATIENT_CLINIC_OR_DEPARTMENT_OTHER): Payer: Medicare Other

## 2015-10-04 ENCOUNTER — Encounter: Payer: Self-pay | Admitting: Internal Medicine

## 2015-10-04 ENCOUNTER — Telehealth: Payer: Self-pay | Admitting: Internal Medicine

## 2015-10-04 ENCOUNTER — Ambulatory Visit (HOSPITAL_BASED_OUTPATIENT_CLINIC_OR_DEPARTMENT_OTHER): Payer: Medicare Other | Admitting: Internal Medicine

## 2015-10-04 ENCOUNTER — Ambulatory Visit (HOSPITAL_BASED_OUTPATIENT_CLINIC_OR_DEPARTMENT_OTHER): Payer: Medicare Other

## 2015-10-04 VITALS — BP 140/70 | HR 74 | Temp 97.8°F | Resp 18 | Ht 62.0 in | Wt 135.8 lb

## 2015-10-04 DIAGNOSIS — C7951 Secondary malignant neoplasm of bone: Secondary | ICD-10-CM

## 2015-10-04 DIAGNOSIS — M2550 Pain in unspecified joint: Secondary | ICD-10-CM

## 2015-10-04 DIAGNOSIS — C50911 Malignant neoplasm of unspecified site of right female breast: Secondary | ICD-10-CM | POA: Diagnosis not present

## 2015-10-04 DIAGNOSIS — Z853 Personal history of malignant neoplasm of breast: Secondary | ICD-10-CM

## 2015-10-04 LAB — COMPREHENSIVE METABOLIC PANEL
ALBUMIN: 3.7 g/dL (ref 3.5–5.0)
ALK PHOS: 54 U/L (ref 40–150)
ALT: 15 U/L (ref 0–55)
AST: 14 U/L (ref 5–34)
Anion Gap: 8 mEq/L (ref 3–11)
BILIRUBIN TOTAL: 0.51 mg/dL (ref 0.20–1.20)
BUN: 13.7 mg/dL (ref 7.0–26.0)
CO2: 27 mEq/L (ref 22–29)
Calcium: 9.1 mg/dL (ref 8.4–10.4)
Chloride: 105 mEq/L (ref 98–109)
Creatinine: 0.9 mg/dL (ref 0.6–1.1)
EGFR: 66 mL/min/{1.73_m2} — ABNORMAL LOW (ref >90)
GLUCOSE: 119 mg/dL (ref 70–140)
POTASSIUM: 3.9 meq/L (ref 3.5–5.1)
SODIUM: 140 meq/L (ref 136–145)
TOTAL PROTEIN: 7 g/dL (ref 6.4–8.3)

## 2015-10-04 LAB — CBC WITH DIFFERENTIAL/PLATELET
BASO%: 0.6 % (ref 0.0–2.0)
Basophils Absolute: 0 10*3/uL (ref 0.0–0.1)
EOS%: 5.5 % (ref 0.0–7.0)
Eosinophils Absolute: 0.3 10*3/uL (ref 0.0–0.5)
HCT: 41.6 % (ref 34.8–46.6)
HEMOGLOBIN: 14 g/dL (ref 11.6–15.9)
LYMPH%: 32.8 % (ref 14.0–49.7)
MCH: 32.4 pg (ref 25.1–34.0)
MCHC: 33.7 g/dL (ref 31.5–36.0)
MCV: 96.3 fL (ref 79.5–101.0)
MONO#: 0.4 10*3/uL (ref 0.1–0.9)
MONO%: 7.6 % (ref 0.0–14.0)
NEUT%: 53.5 % (ref 38.4–76.8)
NEUTROS ABS: 2.8 10*3/uL (ref 1.5–6.5)
Platelets: 143 10*3/uL — ABNORMAL LOW (ref 145–400)
RBC: 4.32 10*6/uL (ref 3.70–5.45)
RDW: 12.7 % (ref 11.2–14.5)
WBC: 5.3 10*3/uL (ref 3.9–10.3)
lymph#: 1.7 10*3/uL (ref 0.9–3.3)

## 2015-10-04 MED ORDER — DENOSUMAB 120 MG/1.7ML ~~LOC~~ SOLN
120.0000 mg | Freq: Once | SUBCUTANEOUS | Status: AC
  Administered 2015-10-04: 120 mg via SUBCUTANEOUS
  Filled 2015-10-04: qty 1.7

## 2015-10-04 NOTE — Telephone Encounter (Signed)
Gave and printed appt sched and avs for pt for Sept °

## 2015-10-04 NOTE — Patient Instructions (Signed)
Denosumab injection What is this medicine? DENOSUMAB (den oh sue mab) slows bone breakdown. Prolia is used to treat osteoporosis in women after menopause and in men. Xgeva is used to prevent bone fractures and other bone problems caused by cancer bone metastases. Xgeva is also used to treat giant cell tumor of the bone. This medicine may be used for other purposes; ask your health care provider or pharmacist if you have questions. COMMON BRAND NAME(S): Prolia, XGEVA What should I tell my health care provider before I take this medicine? They need to know if you have any of these conditions: -dental disease -eczema -infection or history of infections -kidney disease or on dialysis -low blood calcium or vitamin D -malabsorption syndrome -scheduled to have surgery or tooth extraction -taking medicine that contains denosumab -thyroid or parathyroid disease -an unusual reaction to denosumab, other medicines, foods, dyes, or preservatives -pregnant or trying to get pregnant -breast-feeding How should I use this medicine? This medicine is for injection under the skin. It is given by a health care professional in a hospital or clinic setting. If you are getting Prolia, a special MedGuide will be given to you by the pharmacist with each prescription and refill. Be sure to read this information carefully each time. For Prolia, talk to your pediatrician regarding the use of this medicine in children. Special care may be needed. For Xgeva, talk to your pediatrician regarding the use of this medicine in children. While this drug may be prescribed for children as young as 13 years for selected conditions, precautions do apply. Overdosage: If you think you've taken too much of this medicine contact a poison control center or emergency room at once. Overdosage: If you think you have taken too much of this medicine contact a poison control center or emergency room at once. NOTE: This medicine is only for  you. Do not share this medicine with others. What if I miss a dose? It is important not to miss your dose. Call your doctor or health care professional if you are unable to keep an appointment. What may interact with this medicine? Do not take this medicine with any of the following medications: -other medicines containing denosumab This medicine may also interact with the following medications: -medicines that suppress the immune system -medicines that treat cancer -steroid medicines like prednisone or cortisone This list may not describe all possible interactions. Give your health care provider a list of all the medicines, herbs, non-prescription drugs, or dietary supplements you use. Also tell them if you smoke, drink alcohol, or use illegal drugs. Some items may interact with your medicine. What should I watch for while using this medicine? Visit your doctor or health care professional for regular checks on your progress. Your doctor or health care professional may order blood tests and other tests to see how you are doing. Call your doctor or health care professional if you get a cold or other infection while receiving this medicine. Do not treat yourself. This medicine may decrease your body's ability to fight infection. You should make sure you get enough calcium and vitamin D while you are taking this medicine, unless your doctor tells you not to. Discuss the foods you eat and the vitamins you take with your health care professional. See your dentist regularly. Brush and floss your teeth as directed. Before you have any dental work done, tell your dentist you are receiving this medicine. Do not become pregnant while taking this medicine or for 5 months after stopping   it. Women should inform their doctor if they wish to become pregnant or think they might be pregnant. There is a potential for serious side effects to an unborn child. Talk to your health care professional or pharmacist for more  information. What side effects may I notice from receiving this medicine? Side effects that you should report to your doctor or health care professional as soon as possible: -allergic reactions like skin rash, itching or hives, swelling of the face, lips, or tongue -breathing problems -chest pain -fast, irregular heartbeat -feeling faint or lightheaded, falls -fever, chills, or any other sign of infection -muscle spasms, tightening, or twitches -numbness or tingling -skin blisters or bumps, or is dry, peels, or red -slow healing or unexplained pain in the mouth or jaw -unusual bleeding or bruising Side effects that usually do not require medical attention (Report these to your doctor or health care professional if they continue or are bothersome.): -muscle pain -stomach upset, gas This list may not describe all possible side effects. Call your doctor for medical advice about side effects. You may report side effects to FDA at 1-800-FDA-1088. Where should I keep my medicine? This medicine is only given in a clinic, doctor's office, or other health care setting and will not be stored at home. NOTE: This sheet is a summary. It may not cover all possible information. If you have questions about this medicine, talk to your doctor, pharmacist, or health care provider.  2015, Elsevier/Gold Standard. (2011-10-15 12:37:47)  

## 2015-10-04 NOTE — Progress Notes (Signed)
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 The patient has a history of right breast carcinoma status post lumpectomy followed by radiotherapy in 2007 diagnosed in [place] and the patient declined any further treatment at that time.  PRIOR THERAPY: None  CURRENT THERAPY:  1) Arimidex 1 mg by mouth daily. Started 01/22/2014. 2) Xgeva 120 g subcutaneously monthly.  INTERVAL HISTORY: Erica Young 75 y.o. female returns to the clinic today for followup visit. The patient is feeling very well today with no specific complaints except for arthralgia. She is concerned about continuing treatment with Xgeva and Arimidex and would like to stop her treatment because of the arthralgia and concern about future adverse effect especially osteonecrosis of the jaw. She denied having any significant weight loss or night sweats. She has no nausea or vomiting. The patient denied having any significant fever or chills. She denied having any significant adverse effects from the treatment with Arimidex. She is also on treatment with Xgeva for the metastatic bone disease and no significant adverse from this treatment except for mild arthralgia after her injection. She had repeat CBC, comprehensive metabolic panel and CA 95.09 earlier today as well as recent CT scan of the chest, abdomen and pelvis performed recently and she is here for evaluation and discussion of her lab results.   MEDICAL HISTORY: Past Medical History  Diagnosis Date  . HYPERLIPIDEMIA 03/03/2010  . CEREBROVASCULAR DISEASE 03/03/2010  . DIVERTICULOSIS, COLON 03/03/2010  . Low back pain   . Right leg pain   . History of cerebral artery stenosis     right middle  . Mastoiditis     noted on brain MRI  .  Foramen ovale     positive bubble study  . Diverticulitis   . Fatty liver 08/02/09    as per U/S done by [facility]  . Internal hemorrhoid   . Anxiety   . Hypertension   . Ulcer   . Stroke [facility]) Oct. 2011    TIA  . HYPOTHYROIDISM 03/03/2010    no longer on meds  . Shortness of breath   . Headache(784.0)     she thinks sinus headaches  . Rib fracture   . Cancer [facility]) 2007    right breat ca  , lumpectomy and radiation tx (declined chemo and additional prophylactic meds)  . Bone cancer ([place]) mri 11/11/14    right parietal bone,left cribriform plate metastases  . Allergy     ALLERGIES:  is allergic to ciprofloxacin.  MEDICATIONS:  Current Outpatient Prescriptions  Medication Sig Dispense Refill  . ALPRAZolam (XANAX) 0.25 MG tablet TAKE 1 TABLET BY MOUTH THREE TIMES DAILY AS NEEDED FOR ANXIETY 90 tablet 2  . anastrozole (ARIMIDEX) 1 MG tablet Take 1 tablet (1 mg total) by mouth daily. 30 tablet 3  . aspirin EC 81 MG tablet Take 81 mg by mouth daily.    . Calcium-Vitamin D (CALTRATE 600 PLUS-VIT D PO) Take 1 tablet by mouth 2 (two) times daily.     Marland Kitchen CARTIA XT 120 MG 24 hr capsule TAKE 1 CAPSULE BY MOUTH DAILY 90 capsule 1  . cetirizine (ZYRTEC) 10 MG tablet Take 10 mg by mouth daily as needed.     . Cyanocobalamin (VITAMIN B 12 PO) Take 100 mcg by mouth  daily.    . denosumab (XGEVA) 120 MG/1.7ML SOLN injection Inject 120 mg into the skin every 30 (thirty) days.    . meclizine (ANTIVERT) 25 MG tablet Take 1 tablet (25 mg total) by mouth every 6 (six) hours as needed for dizziness. 60 tablet 3  . Melatonin 10 MG TABS Take 1 tablet by mouth daily.    . Multiple Vitamin (MULTIVITAMIN WITH MINERALS) TABS tablet Take 1 tablet by mouth daily.    Marland Kitchen omeprazole (PRILOSEC) 40 MG capsule Take 1 capsule (40 mg total) by mouth daily. 60 capsule 0  . Probiotic Product (PROBIOTIC DAILY PO) Take 1 capsule by mouth once. Takes 1 a day    . simvastatin (ZOCOR) 5 MG tablet TAKE 1 TABLET BY  MOUTH DAILY 90 tablet 1   No current facility-administered medications for this visit.    SURGICAL HISTORY:  Past Surgical History  Procedure Laterality Date  . Cholecystectomy    . Breast lumpectomy      right  . Tonsillectomy    . Colonoscopy    . Tubal ligation    . Shoulder surgery      right shoulder (dislocation)  . Video bronchoscopy with endobronchial ultrasound N/A 01/07/2014    Procedure: VIDEO BRONCHOSCOPY WITH ENDOBRONCHIAL ULTRASOUND;  Surgeon: Ivin Poot, MD;  Location: [facility] OR;  Service: Thoracic;  Laterality: N/A;    REVIEW OF SYSTEMS:  Constitutional: negative Eyes: negative Ears, nose, mouth, throat, and face: negative Respiratory: negative Cardiovascular: negative Gastrointestinal: negative Genitourinary:negative Integument/breast: negative Hematologic/lymphatic: negative Musculoskeletal:positive for arthralgias Neurological: negative Behavioral/Psych: negative Endocrine: negative Allergic/Immunologic: negative   PHYSICAL EXAMINATION: General appearance: alert, cooperative and no distress Head: Normocephalic, without obvious abnormality, atraumatic Neck: no adenopathy, no JVD, supple, symmetrical, trachea midline and thyroid not enlarged, symmetric, no tenderness/mass/nodules Lymph nodes: Cervical, supraclavicular, and axillary nodes normal. Resp: clear to auscultation bilaterally Back: symmetric, no curvature. ROM normal. No CVA tenderness. Cardio: regular rate and rhythm, S1, S2 normal, no murmur, click, rub or gallop GI: soft, non-tender; bowel sounds normal; no masses,  no organomegaly Extremities: extremities normal, atraumatic, no cyanosis or edema Neurologic: Alert and oriented X 3, normal strength and tone. Normal symmetric reflexes. Normal coordination and gait  ECOG PERFORMANCE STATUS: 1 - Symptomatic but completely ambulatory  Blood pressure 140/70, pulse 74, temperature 97.8 F (36.6 C), temperature source Oral, resp. rate 18, height  '5\' 2"'$  (1.575 m), weight 135 lb 12.8 oz (61.598 kg), SpO2 96 %.  LABORATORY DATA: Lab Results  Component Value Date   WBC 5.3 10/04/2015   HGB 14.0 10/04/2015   HCT 41.6 10/04/2015   MCV 96.3 10/04/2015   PLT 143* 10/04/2015      Chemistry      Component Value Date/Time   NA 140 08/30/2015 0806   NA 138 01/06/2014 0910   K 3.9 08/30/2015 0806   K 4.3 01/06/2014 0910   CL 104 01/06/2014 0910   CO2 25 08/30/2015 0806   CO2 22 01/06/2014 0910   BUN 15.4 08/30/2015 0806   BUN 9 01/06/2014 0910   CREATININE 0.9 08/30/2015 0806   CREATININE 0.56 01/06/2014 0910      Component Value Date/Time   CALCIUM 9.0 08/30/2015 0806   CALCIUM 9.0 01/06/2014 0910   ALKPHOS 53 08/30/2015 0806   ALKPHOS 87 01/06/2014 0910   AST 18 08/30/2015 0806   AST 17 01/06/2014 0910   ALT 19 08/30/2015 0806   ALT 16 01/06/2014 0910   BILITOT 0.49 08/30/2015 0806  BILITOT 0.3 01/06/2014 0910       RADIOGRAPHIC STUDIES: Ct Chest W Contrast  10/03/2015  CLINICAL DATA:  75 year old female with history of breast cancer diagnosed 2007 and again in September 2015 status post right lumpectomy, as well as chemotherapy and radiation therapy, now complete. Known bone metastasis. Intermittent central chest pain with cough and shortness of breath. Diffuse abdominal pain. EXAM: CT CHEST, ABDOMEN, AND PELVIS WITH CONTRAST TECHNIQUE: Multidetector CT imaging of the chest, abdomen and pelvis was performed following the standard protocol during bolus administration of intravenous contrast. CONTRAST:  160m ISOVUE-300 IOPAMIDOL (ISOVUE-300) INJECTION 61% COMPARISON:  Multiple priors, most recently CT of the chest, abdomen and pelvis 04/11/2015. FINDINGS: CT CHEST FINDINGS Mediastinum/Lymph Nodes: Heart size is normal. There is no significant pericardial fluid, thickening or pericardial calcification. Multiple borderline enlarged and mildly enlarged mediastinal and right hilar lymph nodes are generally decreased compared to  the prior examination, best demonstrated by a low left paratracheal lymph node which currently measures 11 mm in short axis (previously 15 mm in short axis). The largest right hilar lymph node measures 1 cm in short axis. No left hilar lymphadenopathy. Esophagus is unremarkable in appearance. No internal mammary or axillary lymphadenopathy. Status post right axillary lymph node dissection. Lungs/Pleura: Previously noted left lower lobe nodule is very linear in appearance measuring 7 x 4 mm on today's examination, relatively similar to the most recent prior study. The other previously noted pleural-based nodules in the left lung have resolved or are stable. Previously noted right lower lobe nodule is similar to the prior examination measuring 7 x 5 mm (mean diameter of 6 mm) on image 69 of series 4. Adjacent to this right lower lobe nodule there is a bandlike area of probable postradiation fibrosis. Along the posterior margin of this bandlike area there is an additional nodule on today's study which is slightly more apparent than prior examinations (which may reflect differences in imaging technique between today's exam and prior studies, as today's study is 2 mm thick lung reconstructions to 5 mm thick reconstructions on the prior study) which measures 6 x 6 mm (image 75 of series 4). Several small calcified granulomas are again noted in the right lung. No other new suspicious appearing pulmonary nodules or masses are noted. No acute consolidative airspace disease. No pleural effusions. Musculoskeletal/Soft Tissues: Small mixed lucent and sclerotic lesion in the posterior aspect of the left fifth rib is similar to prior studies. CT ABDOMEN AND PELVIS FINDINGS Hepatobiliary: No suspicious cystic or solid hepatic lesions. No intra or extrahepatic biliary ductal dilatation. Status post cholecystectomy. Pancreas: No pancreatic mass. No pancreatic ductal dilatation. No pancreatic or peripancreatic fluid or inflammatory  changes. Spleen: Unremarkable. Adrenals/Urinary Tract: Bilateral adrenal glands and the left kidney are normal in appearance. Several low-attenuation lesions in the right kidney are similar to prior examinations, the largest of these are compatible with simple cysts measuring up to 1.5 cm in the interpolar region, while the smaller lesions are too small to definitively characterize, but are also favored to represent small cysts. No hydroureteronephrosis. Urinary bladder is normal in appearance. Stomach/Bowel: Normal appearance of the stomach. No pathologic dilatation of small bowel or colon. Normal appendix. Numerous colonic diverticulae are noted, without surrounding inflammatory changes to suggest an acute diverticulitis at this time. Vascular/Lymphatic: Atherosclerosis throughout the abdominal and pelvic vasculature, without evidence of aneurysm or dissection. No lymphadenopathy noted in the abdomen or pelvis. Reproductive: Uterus and ovaries are atrophic. Other: No significant volume of ascites.  No  pneumoperitoneum. Musculoskeletal: Several predominantly sclerotic lesions are noted throughout the visualized axial and appendicular skeleton, similar to prior examinations, largest of which are in the L2 vertebral body measuring 2.5 x 3.1 cm and in a lumbarized S1 vertebral body measuring 2.1 x 2.8 cm. IMPRESSION: 1. Today's study demonstrates significant regression of disease as evidenced by regression of mediastinal and right hilar lymphadenopathy compared to the prior examination. Metastatic disease in the lungs also appears slightly regressed, while bony lesions appear essentially unchanged compared to prior examinations. 2. There is one nodule in the right lower lobe that measures 6 mm (image 75 of series 4) which is slightly more apparent than prior studies, however, this is favored to be technique related. Attention at time of follow-up imaging is recommended. 3. Colonic diverticulosis without findings to  suggest acute diverticulitis at this time. 4. Atherosclerosis. 5. Additional incidental findings, as above. Electronically Signed   By: Vinnie Langton M.D.   On: 10/03/2015 09:35   Ct Abdomen Pelvis W Contrast  10/03/2015  CLINICAL DATA:  76 year old female with history of breast cancer diagnosed 2007 and again in September 2015 status post right lumpectomy, as well as chemotherapy and radiation therapy, now complete. Known bone metastasis. Intermittent central chest pain with cough and shortness of breath. Diffuse abdominal pain. EXAM: CT CHEST, ABDOMEN, AND PELVIS WITH CONTRAST TECHNIQUE: Multidetector CT imaging of the chest, abdomen and pelvis was performed following the standard protocol during bolus administration of intravenous contrast. CONTRAST:  163m ISOVUE-300 IOPAMIDOL (ISOVUE-300) INJECTION 61% COMPARISON:  Multiple priors, most recently CT of the chest, abdomen and pelvis 04/11/2015. FINDINGS: CT CHEST FINDINGS Mediastinum/Lymph Nodes: Heart size is normal. There is no significant pericardial fluid, thickening or pericardial calcification. Multiple borderline enlarged and mildly enlarged mediastinal and right hilar lymph nodes are generally decreased compared to the prior examination, best demonstrated by a low left paratracheal lymph node which currently measures 11 mm in short axis (previously 15 mm in short axis). The largest right hilar lymph node measures 1 cm in short axis. No left hilar lymphadenopathy. Esophagus is unremarkable in appearance. No internal mammary or axillary lymphadenopathy. Status post right axillary lymph node dissection. Lungs/Pleura: Previously noted left lower lobe nodule is very linear in appearance measuring 7 x 4 mm on today's examination, relatively similar to the most recent prior study. The other previously noted pleural-based nodules in the left lung have resolved or are stable. Previously noted right lower lobe nodule is similar to the prior examination  measuring 7 x 5 mm (mean diameter of 6 mm) on image 69 of series 4. Adjacent to this right lower lobe nodule there is a bandlike area of probable postradiation fibrosis. Along the posterior margin of this bandlike area there is an additional nodule on today's study which is slightly more apparent than prior examinations (which may reflect differences in imaging technique between today's exam and prior studies, as today's study is 2 mm thick lung reconstructions to 5 mm thick reconstructions on the prior study) which measures 6 x 6 mm (image 75 of series 4). Several small calcified granulomas are again noted in the right lung. No other new suspicious appearing pulmonary nodules or masses are noted. No acute consolidative airspace disease. No pleural effusions. Musculoskeletal/Soft Tissues: Small mixed lucent and sclerotic lesion in the posterior aspect of the left fifth rib is similar to prior studies. CT ABDOMEN AND PELVIS FINDINGS Hepatobiliary: No suspicious cystic or solid hepatic lesions. No intra or extrahepatic biliary ductal dilatation. Status post cholecystectomy.  Pancreas: No pancreatic mass. No pancreatic ductal dilatation. No pancreatic or peripancreatic fluid or inflammatory changes. Spleen: Unremarkable. Adrenals/Urinary Tract: Bilateral adrenal glands and the left kidney are normal in appearance. Several low-attenuation lesions in the right kidney are similar to prior examinations, the largest of these are compatible with simple cysts measuring up to 1.5 cm in the interpolar region, while the smaller lesions are too small to definitively characterize, but are also favored to represent small cysts. No hydroureteronephrosis. Urinary bladder is normal in appearance. Stomach/Bowel: Normal appearance of the stomach. No pathologic dilatation of small bowel or colon. Normal appendix. Numerous colonic diverticulae are noted, without surrounding inflammatory changes to suggest an acute diverticulitis at this  time. Vascular/Lymphatic: Atherosclerosis throughout the abdominal and pelvic vasculature, without evidence of aneurysm or dissection. No lymphadenopathy noted in the abdomen or pelvis. Reproductive: Uterus and ovaries are atrophic. Other: No significant volume of ascites.  No pneumoperitoneum. Musculoskeletal: Several predominantly sclerotic lesions are noted throughout the visualized axial and appendicular skeleton, similar to prior examinations, largest of which are in the L2 vertebral body measuring 2.5 x 3.1 cm and in a lumbarized S1 vertebral body measuring 2.1 x 2.8 cm. IMPRESSION: 1. Today's study demonstrates significant regression of disease as evidenced by regression of mediastinal and right hilar lymphadenopathy compared to the prior examination. Metastatic disease in the lungs also appears slightly regressed, while bony lesions appear essentially unchanged compared to prior examinations. 2. There is one nodule in the right lower lobe that measures 6 mm (image 75 of series 4) which is slightly more apparent than prior studies, however, this is favored to be technique related. Attention at time of follow-up imaging is recommended. 3. Colonic diverticulosis without findings to suggest acute diverticulitis at this time. 4. Atherosclerosis. 5. Additional incidental findings, as above. Electronically Signed   By: Vinnie Langton M.D.   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 Her disease is mainly in the bone as well as the mediastinum. The patient is currently asymptomatic and has no visceral crisis. She is currently on treatment with Arimidex 1 mg by mouth daily since 01/22/2014 and tolerating her treatment fairly well with no significant adverse effects.  Her recent CT scan of the chest, abdomen and pelvis showed significant improvement of her disease. I discussed the  scan results with the patient and her husband today. She seems a little bit dissatisfied with her current treatment plan and disease condition at this point.  I recommended for her to continue her treatment with Arimidex but the patient decided against proceeding with any further treatment at this point. I discussed with the patient several options including second opinion with one of our breast specialist or another Young but the patient declined and she would like to continue her visit and care with me.  2) Metastatic bone disease: She was started on [place]. She still concerned about osteonecrosis of the jaw and she decided to discontinue treatment with Xgeva after the dose today. I also advised the patient to take a daily calcium and vitamin D supplements. She was also advised to keep her regular appointment with her dentist for good dental hygiene and evaluation.  3) Followup visit: The patient would come back for followup visit in 3 months with repeat blood work for reevaluation of her disease.  She was advised to call immediately if she has any concerning symptoms  in the interval.  The patient voices understanding of current disease status and treatment options and is in agreement with the current care plan.  All questions were answered. The patient knows to call the clinic with any problems, questions or concerns. We can certainly see the patient much sooner if necessary.  Disclaimer: This note was dictated with voice recognition software. Similar sounding words can inadvertently be transcribed and may not be corrected upon review.

## 2015-12-25 ENCOUNTER — Other Ambulatory Visit: Payer: Self-pay | Admitting: Internal Medicine

## 2015-12-25 NOTE — Telephone Encounter (Signed)
Okay to refill? 

## 2016-01-03 ENCOUNTER — Other Ambulatory Visit (HOSPITAL_BASED_OUTPATIENT_CLINIC_OR_DEPARTMENT_OTHER): Payer: Medicare Other

## 2016-01-03 DIAGNOSIS — C7951 Secondary malignant neoplasm of bone: Secondary | ICD-10-CM

## 2016-01-03 DIAGNOSIS — C50919 Malignant neoplasm of unspecified site of unspecified female breast: Secondary | ICD-10-CM

## 2016-01-03 DIAGNOSIS — C50911 Malignant neoplasm of unspecified site of right female breast: Secondary | ICD-10-CM | POA: Diagnosis not present

## 2016-01-03 DIAGNOSIS — Z853 Personal history of malignant neoplasm of breast: Secondary | ICD-10-CM | POA: Diagnosis not present

## 2016-01-03 LAB — COMPREHENSIVE METABOLIC PANEL
ALBUMIN: 3.6 g/dL (ref 3.5–5.0)
ALT: 22 U/L (ref 0–55)
ANION GAP: 12 meq/L — AB (ref 3–11)
AST: 19 U/L (ref 5–34)
Alkaline Phosphatase: 63 U/L (ref 40–150)
BILIRUBIN TOTAL: 0.47 mg/dL (ref 0.20–1.20)
BUN: 13.5 mg/dL (ref 7.0–26.0)
CALCIUM: 9.1 mg/dL (ref 8.4–10.4)
CHLORIDE: 106 meq/L (ref 98–109)
CO2: 24 mEq/L (ref 22–29)
CREATININE: 0.9 mg/dL (ref 0.6–1.1)
EGFR: 67 mL/min/{1.73_m2} — ABNORMAL LOW (ref >90)
Glucose: 169 mg/dl — ABNORMAL HIGH (ref 70–140)
Potassium: 3.7 mEq/L (ref 3.5–5.1)
Sodium: 142 mEq/L (ref 136–145)
TOTAL PROTEIN: 7 g/dL (ref 6.4–8.3)

## 2016-01-03 LAB — CBC WITH DIFFERENTIAL/PLATELET
BASO%: 0.8 % (ref 0.0–2.0)
BASOS ABS: 0 10*3/uL (ref 0.0–0.1)
EOS ABS: 0.3 10*3/uL (ref 0.0–0.5)
EOS%: 5 % (ref 0.0–7.0)
HEMATOCRIT: 42 % (ref 34.8–46.6)
HEMOGLOBIN: 14.2 g/dL (ref 11.6–15.9)
LYMPH#: 1.6 10*3/uL (ref 0.9–3.3)
LYMPH%: 29.4 % (ref 14.0–49.7)
MCH: 32.2 pg (ref 25.1–34.0)
MCHC: 33.8 g/dL (ref 31.5–36.0)
MCV: 95.3 fL (ref 79.5–101.0)
MONO#: 0.3 10*3/uL (ref 0.1–0.9)
MONO%: 5.2 % (ref 0.0–14.0)
NEUT%: 59.6 % (ref 38.4–76.8)
NEUTROS ABS: 3.3 10*3/uL (ref 1.5–6.5)
PLATELETS: 161 10*3/uL (ref 145–400)
RBC: 4.41 10*6/uL (ref 3.70–5.45)
RDW: 13.1 % (ref 11.2–14.5)
WBC: 5.5 10*3/uL (ref 3.9–10.3)

## 2016-01-04 LAB — CANCER ANTIGEN 27.29: CA 27.29: 50.6 U/mL — ABNORMAL HIGH (ref 0.0–38.6)

## 2016-01-10 ENCOUNTER — Ambulatory Visit (HOSPITAL_BASED_OUTPATIENT_CLINIC_OR_DEPARTMENT_OTHER): Payer: Medicare Other | Admitting: Internal Medicine

## 2016-01-10 ENCOUNTER — Encounter: Payer: Self-pay | Admitting: Internal Medicine

## 2016-01-10 VITALS — BP 140/76 | HR 98 | Temp 97.7°F | Ht 62.0 in | Wt 135.3 lb

## 2016-01-10 DIAGNOSIS — C7951 Secondary malignant neoplasm of bone: Secondary | ICD-10-CM

## 2016-01-10 DIAGNOSIS — Z170 Estrogen receptor positive status [ER+]: Secondary | ICD-10-CM | POA: Diagnosis not present

## 2016-01-10 DIAGNOSIS — Z853 Personal history of malignant neoplasm of breast: Secondary | ICD-10-CM | POA: Diagnosis not present

## 2016-01-10 DIAGNOSIS — C50911 Malignant neoplasm of unspecified site of right female breast: Secondary | ICD-10-CM | POA: Diagnosis not present

## 2016-01-10 NOTE — Progress Notes (Signed)
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 The patient has a history of right breast carcinoma status post lumpectomy followed by radiotherapy in 2007 diagnosed in [place] and the patient declined any further treatment at that time.  PRIOR THERAPY: None  CURRENT THERAPY:  1) Arimidex 1 mg by mouth daily. Started 01/22/2014. Discontinued 6 months ago. 2) Xgeva 120 g subcutaneously monthly. Discontinued based on her request.  INTERVAL HISTORY: Erica Young 75 y.o. female returns to the clinic today for followup visit accompanied by her husband. The patient is feeling fine today with no specific complaints except for chest congestion and cough. She is taking over-the-counter cough medications. She denied having any fever or chills. Her cough is productive of whitish sputum. She denied having any significant chest pain, shortness breath or hemoptysis. She denied having any recent weight loss or night sweats. The patient denied having any significant nausea or vomiting. She discontinued treatment with Arimidex almost 6 months ago. She also declined to continue treatment with Xgeva. She is here today for evaluation with repeat blood work.  MEDICAL HISTORY: Past Medical History:  Diagnosis Date  . Allergy   . Anxiety   . 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 Cancer [facility]) 2007   right breat ca  , lumpectomy and radiation tx (declined chemo and additional prophylactic meds)  . CEREBROVASCULAR DISEASE 03/03/2010  . Diverticulitis   . DIVERTICULOSIS, COLON 03/03/2010  . Fatty liver 08/02/09   as per U/S done by [facility]  . Foramen ovale    positive bubble study  . Headache(784.0)    she thinks  sinus headaches  . History of cerebral artery stenosis    right middle  . HYPERLIPIDEMIA 03/03/2010  . Hypertension   . HYPOTHYROIDISM 03/03/2010   no longer on meds  . Internal hemorrhoid   . Low back pain   . Mastoiditis    noted on brain MRI  . Rib fracture   . Right leg pain   . Shortness of breath   . Stroke [facility]) Oct. 2011   TIA  . Ulcer     ALLERGIES:  is allergic to ciprofloxacin.  MEDICATIONS:  Current Outpatient Prescriptions  Medication Sig Dispense Refill  . ALPRAZolam (XANAX) 0.25 MG tablet TAKE 1 TABLET BY MOUTH THREE TIMES DAILY AS NEEDED FOR ANXIETY 90 tablet 2  . aspirin EC 81 MG tablet Take 81 mg by mouth daily.    . Calcium-Vitamin D (CALTRATE 600 PLUS-VIT D PO) Take 1 tablet by mouth 2 (two) times daily.     Marland Kitchen CARTIA XT 120 MG 24 hr capsule TAKE 1 CAPSULE BY MOUTH DAILY 90 capsule 1  . cetirizine (ZYRTEC) 10 MG tablet Take 10 mg by mouth daily as needed.     . Cyanocobalamin (VITAMIN B 12 PO) Take 100 mcg by mouth daily.    . meclizine (ANTIVERT) 25 MG tablet Take 1 tablet (25 mg total) by mouth every 6 (six) hours as needed for dizziness. 60 tablet 3  . Melatonin 10 MG TABS Take 1 tablet by mouth daily.    . Multiple Vitamin (MULTIVITAMIN WITH MINERALS) TABS tablet Take 1 tablet by mouth daily.    Marland Kitchen omeprazole (PRILOSEC) 40 MG capsule Take 1 capsule (  40 mg total) by mouth daily. 60 capsule 0  . Probiotic Product (PROBIOTIC DAILY PO) Take 1 capsule by mouth once. Takes 1 a day    . simvastatin (ZOCOR) 5 MG tablet TAKE 1 TABLET BY MOUTH DAILY 90 tablet 1   No current facility-administered medications for this visit.     SURGICAL HISTORY:  Past Surgical History:  Procedure Laterality Date  . BREAST LUMPECTOMY     right  . CHOLECYSTECTOMY    . COLONOSCOPY    . SHOULDER SURGERY     right shoulder (dislocation)  . TONSILLECTOMY    . TUBAL LIGATION    . VIDEO BRONCHOSCOPY WITH ENDOBRONCHIAL ULTRASOUND N/A 01/07/2014   Procedure: VIDEO BRONCHOSCOPY WITH  ENDOBRONCHIAL ULTRASOUND;  Surgeon: Ivin Poot, MD;  Location: [facility] OR;  Service: Thoracic;  Laterality: N/A;    REVIEW OF SYSTEMS:  Constitutional: negative Eyes: negative Ears, nose, mouth, throat, and face: negative Respiratory: positive for cough and sputum Cardiovascular: negative Gastrointestinal: negative Genitourinary:negative Integument/breast: negative Hematologic/lymphatic: negative Musculoskeletal:positive for arthralgias Neurological: negative Behavioral/Psych: negative Endocrine: negative Allergic/Immunologic: negative   PHYSICAL EXAMINATION: General appearance: alert, cooperative and no distress Head: Normocephalic, without obvious abnormality, atraumatic Neck: no adenopathy, no JVD, supple, symmetrical, trachea midline and thyroid not enlarged, symmetric, no tenderness/mass/nodules Lymph nodes: Cervical, supraclavicular, and axillary nodes normal. Resp: clear to auscultation bilaterally Back: symmetric, no curvature. ROM normal. No CVA tenderness. Cardio: regular rate and rhythm, S1, S2 normal, no murmur, click, rub or gallop GI: soft, non-tender; bowel sounds normal; no masses,  no organomegaly Extremities: extremities normal, atraumatic, no cyanosis or edema Neurologic: Alert and oriented X 3, normal strength and tone. Normal symmetric reflexes. Normal coordination and gait  ECOG PERFORMANCE STATUS: 1 - Symptomatic but completely ambulatory  Blood pressure 140/76, pulse 98, temperature 97.7 F (36.5 C), temperature source Oral, height '5\' 2"'$  (1.575 m), weight 135 lb 4.8 oz (61.4 kg), SpO2 97 %.  LABORATORY DATA: Lab Results  Component Value Date   WBC 5.5 01/03/2016   HGB 14.2 01/03/2016   HCT 42.0 01/03/2016   MCV 95.3 01/03/2016   PLT 161 01/03/2016      Chemistry      Component Value Date/Time   NA 142 01/03/2016 0810   K 3.7 01/03/2016 0810   CL 104 01/06/2014 0910   CO2 24 01/03/2016 0810   BUN 13.5 01/03/2016 0810   CREATININE 0.9  01/03/2016 0810      Component Value Date/Time   CALCIUM 9.1 01/03/2016 0810   ALKPHOS 63 01/03/2016 0810   AST 19 01/03/2016 0810   ALT 22 01/03/2016 0810   BILITOT 0.47 01/03/2016 0810       RADIOGRAPHIC STUDIES: No results found. 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 Her disease is mainly in the bone as well as the mediastinum. The patient is currently asymptomatic and has no visceral crisis. SheWas on treatment with Arimidex 1 mg by mouth daily since 01/22/2014 and tolerating her treatment fairly well with no significant adverse effects.  Her Last CT scan of the chest, abdomen and pelvis showed significant improvement of her disease. I discussed the scan results with the patient and her husband today. She declines to continue with her treatment with Arimidex or Xgeva. Her tumor marker is doubled within the last 3 months. I discussed the lab result with the patient and her husband. I strongly recommended for her to reconsider  treatment for her condition. She still refusing treatment. I recommended for her second opinion with Dr. Jana Hakim for reevaluation and discussion of other treatment options. She reluctantly agreed to this option.  2) Metastatic bone disease: She was started on [place]. She still concerned about osteonecrosis of the jaw and she decided to discontinue treatment with Xgeva after the dose today. I also advised the patient to take a daily calcium and vitamin D supplements. She was also advised to keep her regular appointment with her dentist for good dental hygiene and evaluation.  3) Followup visit: She will see Dr. Jana Hakim in one month. She was advised to call immediately if she has any concerning symptoms in the interval.  The patient voices understanding of current disease status and treatment options and is in agreement with the current care  plan.  All questions were answered. The patient knows to call the clinic with any problems, questions or concerns. We can certainly see the patient much sooner if necessary.  Disclaimer: This note was dictated with voice recognition software. Similar sounding words can inadvertently be transcribed and may not be corrected upon review.

## 2016-01-11 ENCOUNTER — Telehealth: Payer: Self-pay | Admitting: Oncology

## 2016-01-11 NOTE — Telephone Encounter (Signed)
lvm to inform pt of 9/21 appt per LOS

## 2016-01-13 ENCOUNTER — Ambulatory Visit: Payer: Medicare Other | Admitting: Internal Medicine

## 2016-01-14 ENCOUNTER — Telehealth: Payer: Self-pay | Admitting: Internal Medicine

## 2016-01-14 NOTE — Telephone Encounter (Signed)
Per 9/12 los from MM - f/u with GM in about 1 month - per MM he has spoken with GM. Patient already on schedule for f/u w/GM 9/21 scheduled on 9/13.

## 2016-01-16 ENCOUNTER — Other Ambulatory Visit: Payer: Self-pay | Admitting: Internal Medicine

## 2016-01-17 ENCOUNTER — Other Ambulatory Visit: Payer: Self-pay | Admitting: Internal Medicine

## 2016-01-19 ENCOUNTER — Ambulatory Visit (HOSPITAL_BASED_OUTPATIENT_CLINIC_OR_DEPARTMENT_OTHER): Payer: Medicare Other | Admitting: Oncology

## 2016-01-19 VITALS — BP 142/77 | HR 95 | Temp 97.7°F | Resp 18 | Wt 135.9 lb

## 2016-01-19 DIAGNOSIS — Z170 Estrogen receptor positive status [ER+]: Secondary | ICD-10-CM

## 2016-01-19 DIAGNOSIS — C773 Secondary and unspecified malignant neoplasm of axilla and upper limb lymph nodes: Secondary | ICD-10-CM

## 2016-01-19 DIAGNOSIS — Z853 Personal history of malignant neoplasm of breast: Secondary | ICD-10-CM | POA: Diagnosis not present

## 2016-01-19 DIAGNOSIS — C7951 Secondary malignant neoplasm of bone: Secondary | ICD-10-CM

## 2016-01-19 DIAGNOSIS — C50411 Malignant neoplasm of upper-outer quadrant of right female breast: Secondary | ICD-10-CM

## 2016-01-19 DIAGNOSIS — C50911 Malignant neoplasm of unspecified site of right female breast: Secondary | ICD-10-CM | POA: Diagnosis not present

## 2016-01-19 NOTE — Progress Notes (Signed)
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 It was not palpable. She had a right lumpectomy and full sentinel lymph node sampling. She then received adjuvant radiation, to a total of 33 treatments. She received no systemic therapy.  She then did well until December 2014, when she had a fall and complain of pain in her left ribs. Rib films and chest x-ray on 04/13/2013 showed no fractures, but CT of the abdomen and pelvis on the same day found subcarinal and right hilar adenopathy. This was followed up with a chest CT scan 05/05/2013 confirming extensive mediastinal and right hilar lymphadenopathy, with multiple pulmonary nodules, the largest measuring 0.9 cm. PET scan 05/21/2013 showed hypermetabolic adenopathy in the right and left paratracheal areas, the precarinal, subcarinal and right hilar areas, but no involvement of the liver, and the lung nodules were not hypermetabolic (although they were below the level of reliable detection). In addition, at L5 there was a lucency measuring 1.2 cm.  The PET scan also showed a hypermetabolic focus on the thyroid gland, which was evaluated with neck ultrasound and biopsy 92/95/7473, showing a follicular lesion of  undetermined significance (([id number]).  The patient was referred to pulmonary [Dr Melvyn Novas and Dr Julien Nordmann for further evaluation. Repeat PET scan 12/28/2013 showed, in addition to the adenopathy previously noted, now multiple bony metastases. On 01/07/2014 the patient underwent bronchoscopy and this showed ([id number], together with separate cytology] a low-grade mucinous invasive breast cancer, estrogen receptor 100% positive, progesterone receptor 14% positive, with no HER-2 amplification, the signals ratio being 1.30 and the number per cell 1.95.  The patient was started on anastrozole 01/22/2014; monthly denosumab/Xgeva was added 07/30/2014. She appeared to tolerate this well, and her CA-27-29 (127 at baseline) normalized. Most recent CT scans of chest abdomen and pelvis 10/03/2015 showed continuing response. Despite this good news however, the patient decided to go off anastrozole and denosumab/Xgeva as of June 2017. By the time she saw Dr. Earlie Server again in September 2017 her tumor marker had doubled. At that time the patient was referred to the breast clinic for a second opinion regarding further evaluation and treatment.  INTERVAL HISTORY: Uliana was evaluated in the breast clinic 01/19/2016 accompanied by her husband Mikki Santee  REVIEW OF SYSTEMS: She tells me the reason she had to stop the treatment she had been receiving was the pains she had "all over" from the anastrozole. She feels the pains are now much better, although they have not completely resolved. In fact she had a severe pain in the right shoulder area approximately 2 weeks ago. This lasted a day and resolved without any intervention other than over-the-counter analgesics. About a week ago she had a severe pain in her left back radiating down her left leg. Again this was transient, lasting only a  few hours. She has pain in her hand still, although that is better the reason she stopped the denosumab/Xgeva because of fear regarding  osteonecrosis of the jaw. She otherwise tolerated that well as far as I can tell. Aside from these issues Jullia complains of mild sinus problems and stress urinary incontinence. She does not exercise formally but is very active. A detailed review of systems today was otherwise noncontributory.  PAST MEDICAL HISTORY: Past Medical History:  Diagnosis Date  . Allergy   . Anxiety   . Bone cancer ([place]) mri 11/11/14   right parietal bone,left cribriform plate metastases  . Cancer [facility]) 2007   right breat ca  , lumpectomy and radiation tx (declined chemo and additional prophylactic meds)  . CEREBROVASCULAR DISEASE 03/03/2010  . Diverticulitis   . DIVERTICULOSIS, COLON 03/03/2010  . Fatty liver 08/02/09   as per U/S done by [facility]  . Foramen ovale    positive bubble study  . Headache(784.0)    she thinks sinus headaches  . History of cerebral artery stenosis    right middle  . HYPERLIPIDEMIA 03/03/2010  . Hypertension   . HYPOTHYROIDISM 03/03/2010   no longer on meds  . Internal hemorrhoid   . Low back pain   . Mastoiditis    noted on brain MRI  . Rib fracture   . Right leg pain   . Shortness of breath   . Stroke [facility]) Oct. 2011   TIA  . Ulcer     PAST SURGICAL HISTORY: Past Surgical History:  Procedure Laterality Date  . BREAST LUMPECTOMY     right  . CHOLECYSTECTOMY    . COLONOSCOPY    . SHOULDER SURGERY     right shoulder (dislocation)  . TONSILLECTOMY    . TUBAL LIGATION    . VIDEO BRONCHOSCOPY WITH ENDOBRONCHIAL ULTRASOUND N/A 01/07/2014   Procedure: VIDEO BRONCHOSCOPY WITH ENDOBRONCHIAL ULTRASOUND;  Surgeon: Ivin Poot, MD;  Location: [facility] OR;  Service: Thoracic;  Laterality: N/A;    FAMILY HISTORY Family History  Problem Relation Age of Onset  . Heart disease Sister     cerebral vascular disease also  . Heart disease Brother     cerebral vascular disease also  . Heart disease Brother     cerebral vascular disease  . Heart disease Sister      cebreal vascular disease also  . Heart disease Sister     cebreal vascular diease also  . Heart disease Sister     cerebral vascular diease also  . Stomach cancer Father   . Colon cancer Neg Hx   . Esophageal cancer Neg Hx   . Rectal cancer Neg Hx   The patient's father died from stomach cancer in his late 31s. The patient's mother died in her 63s from a stroke. The patient has 2 brothers, 4 sisters. One sister had leukemia. There is no history of breast, colon, or ovarian cancer in the family to her knowledge  GYNECOLOGIC HISTORY:  No LMP recorded. Patient is postmenopausal. Menarche age 56, first live birth age 66, the patient is [place] P2. She went through menopause in her late 37s. She took no hormone replacement. She took oral contraceptives for approximately 2 years remotely without complications.  SOCIAL HISTORY:  Makala is originally from [place]. 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 Her husband Mortimer Fries is a retired substance abuse Social worker. This is a second marriage for both of them. Jyl Heinz  has 2 children from her first marriage, [name], lives in [place], and worked as a Audiological scientist but is now disabled, and Lawrence Santiago, who lives in [place] and works in Engineer, mining. The patient has 6 grandchildren. She attends [employer].--Mikki Santee has 3 children from his first marriage. [name] Junior lives in [place] and has his own trucking business. He tells me he is estranged from his 2 daughters Amedeo Gory (a retired Pharmacist, hospital) and Salena Saner (who works in a bank). They living [place].    ADVANCED DIRECTIVES: in place   HEALTH MAINTENANCE: Social History  Substance Use Topics  . Smoking status: Former Smoker    Packs/day: 0.50    Years: 20.00    Types: Cigarettes    Quit date: 05/01/1991  . Smokeless tobacco: Never Used  . Alcohol use No     Colonoscopy:  PAP:  Bone density:   Allergies  Allergen Reactions  .  Ciprofloxacin Anaphylaxis    Current Outpatient Prescriptions  Medication Sig Dispense Refill  . ALPRAZolam (XANAX) 0.25 MG tablet TAKE 1 TABLET BY MOUTH THREE TIMES DAILY AS NEEDED FOR ANXIETY 90 tablet 2  . aspirin EC 81 MG tablet Take 81 mg by mouth daily.    . Calcium-Vitamin D (CALTRATE 600 PLUS-VIT D PO) Take 1 tablet by mouth 2 (two) times daily.     Marland Kitchen CARTIA XT 120 MG 24 hr capsule TAKE 1 CAPSULE BY MOUTH DAILY 90 capsule 1  . cetirizine (ZYRTEC) 10 MG tablet Take 10 mg by mouth daily as needed.     . Cyanocobalamin (VITAMIN B 12 PO) Take 100 mcg by mouth daily.    . Glucosamine 500 MG CAPS Take by mouth.    . meclizine (ANTIVERT) 25 MG tablet Take 1 tablet (25 mg total) by mouth every 6 (six) hours as needed for dizziness. 60 tablet 3  . Melatonin 10 MG TABS Take 1 tablet by mouth daily.    . Multiple Vitamin (MULTIVITAMIN WITH MINERALS) TABS tablet Take 1 tablet by mouth daily.    . Probiotic Product (PROBIOTIC DAILY PO) Take 1 capsule by mouth once. Takes 1 a day    . simvastatin (ZOCOR) 5 MG tablet TAKE 1 TABLET BY MOUTH DAILY 90 tablet 1   No current facility-administered medications for this visit.     OBJECTIVE: Older Latin American woman who appears younger than stated age 43:   01/19/16 1616  BP: (!) 142/77  Pulse: 95  Resp: 18  Temp: 97.7 F (36.5 C)     Body mass index is 24.86 kg/m.    ECOG FS:1 - Symptomatic but completely ambulatory  Ocular: Sclerae unicteric, pupils equal, round and reactive to light Ear-nose-throat: Oropharynx clear and moist Lymphatic: No cervical or supraclavicular adenopathy Lungs no rales or rhonchi, good excursion bilaterally Heart regular rate and rhythm, no murmur appreciated Abd soft, nontender, positive bowel sounds MSK no focal spinal tenderness, no right upper extremity lymphedema Neuro: non-focal, well-oriented, appropriate affect Breasts: There is a scar in the upper-outer quadrant of the right breast, which is  otherwise unremarkable. The right axilla is benign. Left breast is benign.   LAB RESULTS:  CMP     Component Value Date/Time   NA 142 01/03/2016 0810   K 3.7 01/03/2016 0810   CL 104 01/06/2014 0910   CO2 24 01/03/2016 0810   GLUCOSE 169 (H) 01/03/2016 0810   BUN 13.5 01/03/2016 0810   CREATININE 0.9 01/03/2016 0810   CALCIUM 9.1 01/03/2016  0810   PROT 7.0 01/03/2016 0810   ALBUMIN 3.6 01/03/2016 0810   AST 19 01/03/2016 0810   ALT 22 01/03/2016 0810   ALKPHOS 63 01/03/2016 0810   BILITOT 0.47 01/03/2016 0810   GFRNONAA >90 01/06/2014 0910   GFRAA >90 01/06/2014 0910    INo results found for: SPEP, UPEP  Lab Results  Component Value Date   WBC 5.5 01/03/2016   NEUTROABS 3.3 01/03/2016   HGB 14.2 01/03/2016   HCT 42.0 01/03/2016   MCV 95.3 01/03/2016   PLT 161 01/03/2016      Chemistry      Component Value Date/Time   NA 142 01/03/2016 0810   K 3.7 01/03/2016 0810   CL 104 01/06/2014 0910   CO2 24 01/03/2016 0810   BUN 13.5 01/03/2016 0810   CREATININE 0.9 01/03/2016 0810      Component Value Date/Time   CALCIUM 9.1 01/03/2016 0810   ALKPHOS 63 01/03/2016 0810   AST 19 01/03/2016 0810   ALT 22 01/03/2016 0810   BILITOT 0.47 01/03/2016 0810       Lab Results  Component Value Date   LABCA2 25 08/30/2015    No components found for: SVXBL390  No results for input(s): INR in the last 168 hours.  Urinalysis    Component Value Date/Time   COLORURINE YELLOW 11/18/2013 1321   APPEARANCEUR CLEAR 11/18/2013 1321   LABSPEC 1.008 11/18/2013 1321   PHURINE 7.5 11/18/2013 1321   GLUCOSEU NEGATIVE 11/18/2013 1321   HGBUR NEGATIVE 11/18/2013 1321   BILIRUBINUR neg 02/16/2015 1318   KETONESUR NEGATIVE 11/18/2013 1321   PROTEINUR neg 02/16/2015 1318   PROTEINUR NEGATIVE 11/18/2013 1321   UROBILINOGEN 0.2 02/16/2015 1318   UROBILINOGEN 0.2 11/18/2013 1321   NITRITE neg 02/16/2015 1318   NITRITE NEGATIVE 11/18/2013 1321   LEUKOCYTESUR Negative 02/16/2015  1318     STUDIES: Most recent scans reviewed  ELIGIBLE FOR AVAILABLE RESEARCH PROTOCOL: no  ASSESSMENT: 75 y.o. [place] woman  (1) status post right breast upper outer quadrant lumpectomy and axillary lymph node dissection in 2007 for what appears to have been a T1 N0, stage IA invasive ductal carcinoma, treated adjuvantly with radiation (33 sessions)  METASTATIC DISEASE definitively documented Sept 2015 (2) bronchoscopic biopsy 01/07/2014 showed a low-grade mucinous breast cancer, strongly estrogen receptor positive, progesterone receptor positive, HER-2 negative; staging studies confirmed extensive hypermetabolic adenopathy, multiple bone lesions, likely early lung involvement, but no liver lesions.  (3) on Arimidex between September 2015 and June 2017, with evidence of response; discontinued secondary to side effects  (4) on monthly denosumab/Xgeva between 07/30/2014 and 10/04/2015, discontinued due to patient's concerns regarding osteonecrosis of the jaw  (5) to start fulvestrant 01/26/2016 area  PLAN: We spent the better part of today's hour-long appointment discussing the biology of breast cancer in general, and the specifics of the patient's tumor in particular. She understands that stage IV breast cancer is not curable with our current knowledge base. The goal of treatment is control. The strategy of treatment is to do only the minimum necessary to control the growth of the tumor so that the patient can have as normal a life as possible. There is no survival advantage in treating aggressively if treating less aggressively results in tumor control. With this strategy stage IV breast cancer in many cases can function as a "chronic illness": something that cannot be quite gotten rid of but can be controlled for an indefinite period of time  We discussed the difference between  local and systemic therapy for breast cancer and she understands that local treatments like radiation and  surgery, which is what she received in 2007, are very effective at controlling the local recurrence of disease. By contrast ystemic therapy can be effective at controlling the cancer not only in the primary site but also elsewhere in the body where it may have spread microscopically or grossly.  She understands that the cancer she has now is the same one she had an 2007, and that this cancer had already spread to her lymph nodes and bones by the time she had her lumpectomy and axillary lymph node dissection at that time. This is why at this point systemic therapy is the mainstay of care, with local treatment reserved for special circumstances such as pain control for example.  We discussed the fact that there are 3 available systemic treatments for breast cancer, namely anti-estrogen pills, anti-HER-2 immunotherapy, and chemotherapy. Because her cancer does not "eat" HER-2, she is not a candidate for anti-HER-2 immunotherapy. Chemotherapy is very useful with rapidly growing aggressive cancers. However her cancer is low grade and clearly very slow-growing since it took it about 8 years to become manifest. While we are not completely ruling out chemotherapy, I suspect it would be minimally effective in her case and we are shelving it for now.  Accordingly anti-estrogens at the right treatment for her and the proof of that fact is that her cancer was well-controlled and responded measurably to anastrozole. However she reports significant pain from that medication and she is not willing to go back to it. She did not have side effects from denosumab/Xgeva and reports no symptoms suggestive of ONJ, but it is the possibility of ONJ that scares her away from that treatment.  We then reviewed the various antiestrogen options. Ruling out anastrozole, and also ruling out tamoxifen, which can result in blood clots and we wish to avoid in this patient with a history of stroke, that leaves letrozole, exemestane, and  fulvestrant. While all would likely be effective, I think we have a better chance at good tolerance with fulvestrant, which is not associated with arthralgias or myalgias. There is of course pain at the actual injection site, but this is monthly and in general patients tolerate this well.  Reily is willing to give this a try, and I have scheduled her for the first dose a week from now. She will need a new baseline for restaging and I am obtaining a bone scan and CT scan of the chest within a week. We will continue to follow her CA-27-29 on a monthly basis. We will probably repeat her staging studies in 3 or 6 months, depending on what we see on the tumor marker.  At some point we will discuss whether she would be willing to give palbociclib a try, but right now I would like to wait until she feels very comfortable with the fulvestrant. I would also like to resume denosumab/Xgeva at some point, but we did not discuss that today.  Ajee has a good understanding of the overall plan. She agrees with it. She knows the goal of treatment in her case is control. She will call with any problems that may develop before her next visit here.  Chauncey Cruel, MD   01/19/2016 6:14 PM Medical Oncology and Hematology [facility] [address] [place], [place] [address] Tel. [phone]    Fax. [phone]

## 2016-01-20 ENCOUNTER — Other Ambulatory Visit: Payer: Self-pay | Admitting: Oncology

## 2016-01-20 NOTE — Addendum Note (Signed)
Addended by: Chauncey Cruel on: 01/20/2016 08:48 AM   Modules accepted: Orders

## 2016-01-24 ENCOUNTER — Ambulatory Visit (INDEPENDENT_AMBULATORY_CARE_PROVIDER_SITE_OTHER): Payer: Medicare Other | Admitting: Internal Medicine

## 2016-01-24 ENCOUNTER — Encounter: Payer: Self-pay | Admitting: Internal Medicine

## 2016-01-24 ENCOUNTER — Telehealth: Payer: Self-pay | Admitting: Internal Medicine

## 2016-01-24 VITALS — BP 130/80 | HR 112 | Temp 98.4°F | Resp 20 | Ht 62.0 in | Wt 133.4 lb

## 2016-01-24 DIAGNOSIS — I10 Essential (primary) hypertension: Secondary | ICD-10-CM

## 2016-01-24 DIAGNOSIS — K5792 Diverticulitis of intestine, part unspecified, without perforation or abscess without bleeding: Secondary | ICD-10-CM | POA: Diagnosis not present

## 2016-01-24 DIAGNOSIS — C50911 Malignant neoplasm of unspecified site of right female breast: Secondary | ICD-10-CM

## 2016-01-24 MED ORDER — SULFAMETHOXAZOLE-TRIMETHOPRIM 800-160 MG PO TABS: 1.0000 | ORAL_TABLET | Freq: Two times a day (BID) | ORAL | 0 refills | Status: DC

## 2016-01-24 MED ORDER — HYDROCODONE-ACETAMINOPHEN 5-325 MG PO TABS: 1.0000 | ORAL_TABLET | Freq: Four times a day (QID) | ORAL | 0 refills | Status: DC | PRN

## 2016-01-24 MED ORDER — METRONIDAZOLE 500 MG PO TABS: 500.0000 mg | ORAL_TABLET | Freq: Three times a day (TID) | ORAL | 0 refills | Status: DC

## 2016-01-24 NOTE — Telephone Encounter (Signed)
Noted. Patient here at office seeing PCP at 10:00am

## 2016-01-24 NOTE — Patient Instructions (Addendum)
Diverticulitis Diverticulitis is when small pockets that have formed in your colon (large intestine) become infected or swollen. HOME CARE  Follow your doctor's instructions.  Follow a special diet if told by your doctor.  When you feel better, your doctor may tell you to change your diet. You may be told to eat a lot of fiber. Fruits and vegetables are good sources of fiber. Fiber makes it easier to poop (have bowel movements).  Take supplements or probiotics as told by your doctor.  Only take medicines as told by your doctor.  Keep all follow-up visits with your doctor. GET HELP IF:  Your pain does not get better.  You have a hard time eating food.  You are not pooping like normal. GET HELP RIGHT AWAY IF:  Your pain gets worse.  Your problems do not get better.  Your problems suddenly get worse.  You have a fever.  You keep throwing up (vomiting).  You have bloody or black, tarry poop (stool). MAKE SURE YOU:   Understand these instructions.  Will watch your condition.  Will get help right away if you are not doing well or get worse.    Report to emergency room immediately if you develop fever, chills, or worsening abdominal pain or nausea or vomiting

## 2016-01-24 NOTE — Telephone Encounter (Signed)
[facility] [place] [facility] - Client [place] [facility] Call [facility] Patient Name: Erica Young DOB: 1941-02-03 Initial Comment Caller states she is having severe abd pain and cramping. Nurse Assessment Nurse: Dimas Chyle, RN, Dellis Filbert Date/Time Eilene Ghazi Time): 01/24/2016 8:21:32 AM Confirm and document reason for call. If symptomatic, describe symptoms. You must click the next button to save text entered. ---Caller states she is having severe abdominal pain and cramping. No vomiting or diarrhea. Fever earlier was 100. Has the patient traveled out of the country within the last 30 days? ---No Does the patient have any new or worsening symptoms? ---Yes Will a triage be completed? ---Yes Related visit to physician within the last 2 weeks? ---No Does the PT have any chronic conditions? (i.e. diabetes, asthma, etc.) ---Yes List chronic conditions. ---Breast cancer Is this a behavioral health or substance abuse call? ---No Guidelines Guideline Title Affirmed Question Affirmed Notes Abdominal Pain - Female [1] SEVERE pain AND [2] age > 66 Final Disposition User Go to ED Now Dimas Chyle, RN, Dellis Filbert Comments Caller refused ED outcome and office was notified. Spoke with PCP medical assistant. Referrals GO TO FACILITY REFUSED Disagree/Comply: Disagree Disagree/Comply Reason: Disagree with instructions

## 2016-01-24 NOTE — Progress Notes (Signed)
Subjective:    Patient ID: Erica Young, female    DOB: 1941-01-01, 75 y.o.   MRN: [id number]  HPI  75 year old patient who is followed by oncology with metastatic breast cancer.  She was stable until yesterday morning when she had the onset of lower abdominal pain associated with some fever and intermittent chills.  Pain is aggravated by movement.  She states that she had several loose bowel movements yesterday but none today.  No nausea or vomiting.  She states that she has had acute diverticulitis in the past and that these symptoms are identical.  Review of medical records reveals prior abdominal CT scans that have revealed left-sided diverticulosis, but no radiographic evidence of diverticulitis.  The patient is scheduled for oncology follow-up later this week.  That will include imaging studies  Past Medical History:  Diagnosis Date  . Allergy   . Anxiety   . Bone cancer ([place]) mri 11/11/14   right parietal bone,left cribriform plate metastases  . Cancer [facility]) 2007   right breat ca  , lumpectomy and radiation tx (declined chemo and additional prophylactic meds)  . CEREBROVASCULAR DISEASE 03/03/2010  . Diverticulitis   . DIVERTICULOSIS, COLON 03/03/2010  . Fatty liver 08/02/09   as per U/S done by [facility]  . Foramen ovale    positive bubble study  . Headache(784.0)    she thinks sinus headaches  . History of cerebral artery stenosis    right middle  . HYPERLIPIDEMIA 03/03/2010  . Hypertension   . HYPOTHYROIDISM 03/03/2010   no longer on meds  . Internal hemorrhoid   . Low back pain   . Mastoiditis    noted on brain MRI  . Rib fracture   . Right leg pain   . Shortness of breath   . Stroke [facility]) Oct. 2011   TIA  . Ulcer      Social History   Social History  . Marital status: Married    Spouse name: N/A  . Number of children: 2  . Years of education: N/A   Occupational History  . Social Worker--Retired    Social History Main Topics  . Smoking status:  Former Smoker    Packs/day: 0.50    Years: 20.00    Types: Cigarettes    Quit date: 05/01/1991  . Smokeless tobacco: Never Used  . Alcohol use No  . Drug use: No  . Sexual activity: Yes   Other Topics Concern  . Not on file   Social History Narrative   Daily caffeine     Past Surgical History:  Procedure Laterality Date  . BREAST LUMPECTOMY     right  . CHOLECYSTECTOMY    . COLONOSCOPY    . SHOULDER SURGERY     right shoulder (dislocation)  . TONSILLECTOMY    . TUBAL LIGATION    . VIDEO BRONCHOSCOPY WITH ENDOBRONCHIAL ULTRASOUND N/A 01/07/2014   Procedure: VIDEO BRONCHOSCOPY WITH ENDOBRONCHIAL ULTRASOUND;  Surgeon: Ivin Poot, MD;  Location: [facility] OR;  Service: Thoracic;  Laterality: N/A;    Family History  Problem Relation Age of Onset  . Heart disease Sister     cerebral vascular disease also  . Heart disease Brother     cerebral vascular disease also  . Heart disease Brother     cerebral vascular disease  . Heart disease Sister     cebreal vascular disease also  . Heart disease Sister     cebreal vascular diease also  . Heart  disease Sister     cerebral vascular diease also  . Stomach cancer Father   . Colon cancer Neg Hx   . Esophageal cancer Neg Hx   . Rectal cancer Neg Hx     Allergies  Allergen Reactions  . Ciprofloxacin Anaphylaxis    Current Outpatient Prescriptions on File Prior to Visit  Medication Sig Dispense Refill  . ALPRAZolam (XANAX) 0.25 MG tablet TAKE 1 TABLET BY MOUTH THREE TIMES DAILY AS NEEDED FOR ANXIETY 90 tablet 2  . aspirin EC 81 MG tablet Take 81 mg by mouth daily.    . Calcium-Vitamin D (CALTRATE 600 PLUS-VIT D PO) Take 1 tablet by mouth 2 (two) times daily.     Marland Kitchen CARTIA XT 120 MG 24 hr capsule TAKE 1 CAPSULE BY MOUTH DAILY 90 capsule 1  . cetirizine (ZYRTEC) 10 MG tablet Take 10 mg by mouth daily as needed.     . Cyanocobalamin (VITAMIN B 12 PO) Take 100 mcg by mouth daily.    . Glucosamine 500 MG CAPS Take by mouth.    .  meclizine (ANTIVERT) 25 MG tablet Take 1 tablet (25 mg total) by mouth every 6 (six) hours as needed for dizziness. 60 tablet 3  . Melatonin 10 MG TABS Take 1 tablet by mouth daily.    . Multiple Vitamin (MULTIVITAMIN WITH MINERALS) TABS tablet Take 1 tablet by mouth daily.    . Probiotic Product (PROBIOTIC DAILY PO) Take 1 capsule by mouth once. Takes 1 a day    . simvastatin (ZOCOR) 5 MG tablet TAKE 1 TABLET BY MOUTH DAILY 90 tablet 1   No current facility-administered medications on file prior to visit.     BP 130/80 (BP Location: Left Arm, Patient Position: Sitting, Cuff Size: Normal)   Pulse (!) 112   Temp 98.4 F (36.9 C) (Oral)   Resp 20   Ht '5\' 2"'$  (1.575 m)   Wt 133 lb 6.1 oz (60.5 kg)   SpO2 96%   BMI 24.40 kg/m     Review of Systems  Constitutional: Positive for activity change, appetite change, chills, fatigue and fever.  HENT: Negative for congestion, dental problem, hearing loss, rhinorrhea, sinus pressure, sore throat and tinnitus.   Eyes: Negative for pain, discharge and visual disturbance.  Respiratory: Negative for cough and shortness of breath.   Cardiovascular: Negative for chest pain, palpitations and leg swelling.  Gastrointestinal: Positive for abdominal pain. Negative for abdominal distention, blood in stool, constipation, diarrhea, nausea and vomiting.  Genitourinary: Negative for difficulty urinating, dysuria, flank pain, frequency, hematuria, pelvic pain, urgency, vaginal bleeding, vaginal discharge and vaginal pain.  Musculoskeletal: Negative for arthralgias, gait problem and joint swelling.  Skin: Negative for rash.  Neurological: Negative for dizziness, syncope, speech difficulty, weakness, numbness and headaches.  Hematological: Negative for adenopathy.  Psychiatric/Behavioral: Negative for agitation, behavioral problems and dysphoric mood. The patient is not nervous/anxious.        Objective:   Physical Exam  Constitutional: She is oriented to  person, place, and time. She appears well-developed and well-nourished.  Uncomfortable but in no acute distress Afebrile Blood pressure on arrival 112 Later in the exam 90-100  HENT:  Head: Normocephalic.  Right Ear: External ear normal.  Left Ear: External ear normal.  Mouth/Throat: Oropharynx is clear and moist.  Eyes: Conjunctivae and EOM are normal. Pupils are equal, round, and reactive to light.  Neck: Normal range of motion. Neck supple. No thyromegaly present.  Cardiovascular: Normal rate, regular rhythm, normal  heart sounds and intact distal pulses.   Pulmonary/Chest: Effort normal and breath sounds normal.  Abdominal: Soft. Bowel sounds are normal. She exhibits no mass. There is tenderness. There is guarding. There is no rebound.  No distention Tendinosis noted in the suprapubic area maximally but also left lower quadrant Bowel sounds active Mild guarding but no significant rebound tenderness  Musculoskeletal: Normal range of motion.  Lymphadenopathy:    She has no cervical adenopathy.  Neurological: She is alert and oriented to person, place, and time.  Skin: Skin is warm and dry. No rash noted.  Psychiatric: She has a normal mood and affect. Her behavior is normal.          Assessment & Plan:   Probable acute diverticulitis.  Imaging study considered, but this will be performed later this week. Patient is sensitive to Cipro, so will treat with metronidazole and Bactrim DS.  Will treat symptomatically with Vicodin if needed Patient report any fever, chills, worsening abdominal pain or vomiting Follow-up oncology later this week  ED evaluation if clinical worsening. for Imaging studies and possible inpatient treatment  [name]

## 2016-01-25 ENCOUNTER — Telehealth: Payer: Self-pay | Admitting: *Deleted

## 2016-01-25 NOTE — Telephone Encounter (Signed)
FYI "I'm concerned about the injection I am to receive tomorrow.  Dr. Inda Merlin started me on a strong antibiotic yesterday (Sulfamethoxazole-trimethoprim) for my diverticulitis.  Will this interfere with the injection.  I also will have to receive an injection on the 29th for the Bone scan."  Will notify provider.  No interaction known to this nurse.

## 2016-01-26 ENCOUNTER — Other Ambulatory Visit (HOSPITAL_BASED_OUTPATIENT_CLINIC_OR_DEPARTMENT_OTHER): Payer: Medicare Other

## 2016-01-26 ENCOUNTER — Ambulatory Visit (HOSPITAL_BASED_OUTPATIENT_CLINIC_OR_DEPARTMENT_OTHER): Payer: Medicare Other

## 2016-01-26 VITALS — BP 124/69 | HR 83 | Temp 98.3°F | Resp 20

## 2016-01-26 DIAGNOSIS — C7951 Secondary malignant neoplasm of bone: Secondary | ICD-10-CM | POA: Diagnosis not present

## 2016-01-26 DIAGNOSIS — C50911 Malignant neoplasm of unspecified site of right female breast: Secondary | ICD-10-CM

## 2016-01-26 DIAGNOSIS — C50411 Malignant neoplasm of upper-outer quadrant of right female breast: Secondary | ICD-10-CM

## 2016-01-26 DIAGNOSIS — Z5111 Encounter for antineoplastic chemotherapy: Secondary | ICD-10-CM | POA: Diagnosis present

## 2016-01-26 DIAGNOSIS — Z853 Personal history of malignant neoplasm of breast: Secondary | ICD-10-CM

## 2016-01-26 LAB — CBC WITH DIFFERENTIAL/PLATELET
BASO%: 0.8 % (ref 0.0–2.0)
Basophils Absolute: 0 10*3/uL (ref 0.0–0.1)
EOS ABS: 0.3 10*3/uL (ref 0.0–0.5)
EOS%: 4.8 % (ref 0.0–7.0)
HCT: 36.9 % (ref 34.8–46.6)
HGB: 12.2 g/dL (ref 11.6–15.9)
LYMPH%: 22.4 % (ref 14.0–49.7)
MCH: 31.8 pg (ref 25.1–34.0)
MCHC: 33.1 g/dL (ref 31.5–36.0)
MCV: 96.2 fL (ref 79.5–101.0)
MONO#: 0.4 10*3/uL (ref 0.1–0.9)
MONO%: 7.1 % (ref 0.0–14.0)
NEUT#: 3.5 10*3/uL (ref 1.5–6.5)
NEUT%: 64.9 % (ref 38.4–76.8)
PLATELETS: 164 10*3/uL (ref 145–400)
RBC: 3.84 10*6/uL (ref 3.70–5.45)
RDW: 13.1 % (ref 11.2–14.5)
WBC: 5.4 10*3/uL (ref 3.9–10.3)
lymph#: 1.2 10*3/uL (ref 0.9–3.3)

## 2016-01-26 LAB — COMPREHENSIVE METABOLIC PANEL
ALBUMIN: 3.1 g/dL — AB (ref 3.5–5.0)
ALT: 36 U/L (ref 0–55)
AST: 20 U/L (ref 5–34)
Alkaline Phosphatase: 75 U/L (ref 40–150)
Anion Gap: 9 mEq/L (ref 3–11)
BUN: 10.8 mg/dL (ref 7.0–26.0)
CALCIUM: 8.8 mg/dL (ref 8.4–10.4)
CHLORIDE: 108 meq/L (ref 98–109)
CO2: 22 mEq/L (ref 22–29)
CREATININE: 0.9 mg/dL (ref 0.6–1.1)
EGFR: 65 mL/min/{1.73_m2} — ABNORMAL LOW (ref >90)
GLUCOSE: 130 mg/dL (ref 70–140)
POTASSIUM: 3.8 meq/L (ref 3.5–5.1)
SODIUM: 139 meq/L (ref 136–145)
Total Bilirubin: 0.38 mg/dL (ref 0.20–1.20)
Total Protein: 6.6 g/dL (ref 6.4–8.3)

## 2016-01-26 MED ORDER — FULVESTRANT 250 MG/5ML IM SOLN
500.0000 mg | Freq: Once | INTRAMUSCULAR | Status: AC
  Administered 2016-01-26: 500 mg via INTRAMUSCULAR
  Filled 2016-01-26: qty 10

## 2016-01-26 NOTE — Patient Instructions (Signed)

## 2016-01-27 ENCOUNTER — Encounter (HOSPITAL_COMMUNITY)
Admission: RE | Admit: 2016-01-27 | Discharge: 2016-01-27 | Disposition: A | Discharge status: Home / Self Care | Payer: Medicare Other | Source: Ambulatory Visit | Attending: Oncology | Admitting: Oncology

## 2016-01-27 ENCOUNTER — Ambulatory Visit (HOSPITAL_COMMUNITY)
Admission: RE | Admit: 2016-01-27 | Discharge: 2016-01-27 | Disposition: A | Discharge status: Home / Self Care | Payer: Medicare Other | Source: Ambulatory Visit | Attending: Oncology | Admitting: Oncology

## 2016-01-27 ENCOUNTER — Encounter (HOSPITAL_COMMUNITY): Payer: Self-pay

## 2016-01-27 DIAGNOSIS — C7951 Secondary malignant neoplasm of bone: Secondary | ICD-10-CM | POA: Insufficient documentation

## 2016-01-27 DIAGNOSIS — C50911 Malignant neoplasm of unspecified site of right female breast: Secondary | ICD-10-CM

## 2016-01-27 DIAGNOSIS — C50919 Malignant neoplasm of unspecified site of unspecified female breast: Secondary | ICD-10-CM | POA: Diagnosis not present

## 2016-01-27 DIAGNOSIS — R918 Other nonspecific abnormal finding of lung field: Secondary | ICD-10-CM | POA: Diagnosis not present

## 2016-01-27 DIAGNOSIS — C50411 Malignant neoplasm of upper-outer quadrant of right female breast: Secondary | ICD-10-CM | POA: Insufficient documentation

## 2016-01-27 DIAGNOSIS — R590 Localized enlarged lymph nodes: Secondary | ICD-10-CM | POA: Insufficient documentation

## 2016-01-27 LAB — CANCER ANTIGEN 27.29: CA 27.29: 48.8 U/mL — ABNORMAL HIGH (ref 0.0–38.6)

## 2016-01-27 MED ORDER — IOPAMIDOL (ISOVUE-300) INJECTION 61%
75.0000 mL | Freq: Once | INTRAVENOUS | Status: AC | PRN
  Administered 2016-01-27: 75 mL via INTRAVENOUS

## 2016-01-27 MED ORDER — TECHNETIUM TC 99M MEDRONATE IV KIT
25.0000 | PACK | Freq: Once | INTRAVENOUS | Status: AC | PRN
  Administered 2016-01-27: 19.4 via INTRAVENOUS

## 2016-02-15 ENCOUNTER — Other Ambulatory Visit: Payer: Self-pay | Admitting: Internal Medicine

## 2016-02-22 ENCOUNTER — Encounter: Payer: Self-pay | Admitting: Internal Medicine

## 2016-02-22 ENCOUNTER — Ambulatory Visit (INDEPENDENT_AMBULATORY_CARE_PROVIDER_SITE_OTHER): Payer: Medicare Other | Admitting: Internal Medicine

## 2016-02-22 VITALS — BP 130/86 | HR 89 | Temp 97.6°F | Resp 18 | Ht 62.0 in | Wt 133.0 lb

## 2016-02-22 DIAGNOSIS — E785 Hyperlipidemia, unspecified: Secondary | ICD-10-CM

## 2016-02-22 DIAGNOSIS — Z23 Encounter for immunization: Secondary | ICD-10-CM

## 2016-02-22 DIAGNOSIS — C50911 Malignant neoplasm of unspecified site of right female breast: Secondary | ICD-10-CM | POA: Diagnosis not present

## 2016-02-22 DIAGNOSIS — R7302 Impaired glucose tolerance (oral): Secondary | ICD-10-CM | POA: Diagnosis not present

## 2016-02-22 DIAGNOSIS — I10 Essential (primary) hypertension: Secondary | ICD-10-CM | POA: Diagnosis not present

## 2016-02-22 LAB — POCT GLUCOSE (DEVICE FOR HOME USE): POC Glucose: 136 mg/dl — AB (ref 70–99)

## 2016-02-22 MED ORDER — ALPRAZOLAM 0.25 MG PO TABS: 0.2500 mg | ORAL_TABLET | Freq: Three times a day (TID) | ORAL | 2 refills | Status: DC | PRN

## 2016-02-22 NOTE — Progress Notes (Signed)
Subjective:    Patient ID: Erica Young, female    DOB: 1940/05/21, 75 y.o.   MRN: [id number]  HPI  75 year old patient who is seen today for follow-up.  She has a history of essential hypertension.  Also has a history of prior TIA.  She is on low intensity statin therapy.  She is asking about discontinuation of low-dose aspirin She has stage IV breast cancer as has been remarkably stable.  No further bone pain since discontinuation of chemotherapy  Wt Readings from Last 3 Encounters:  02/22/16 133 lb (60.3 kg)  01/24/16 133 lb 6.1 oz (60.5 kg)  01/19/16 135 lb 14.4 oz (61.6 kg)   Her weight has been fairly stable.  She has been treated for a bout of acute diverticulitis  Past Medical History:  Diagnosis Date  . Allergy   . Anxiety   . Bone cancer ([place]) mri 11/11/14   right parietal bone,left cribriform plate metastases  . Cancer [facility]) 2007   right breat ca  , lumpectomy and radiation tx (declined chemo and additional prophylactic meds)  . CEREBROVASCULAR DISEASE 03/03/2010  . Diverticulitis   . DIVERTICULOSIS, COLON 03/03/2010  . Fatty liver 08/02/09   as per U/S done by [facility]  . Foramen ovale    positive bubble study  . Headache(784.0)    she thinks sinus headaches  . History of cerebral artery stenosis    right middle  . HYPERLIPIDEMIA 03/03/2010  . Hypertension   . HYPOTHYROIDISM 03/03/2010   no longer on meds  . Internal hemorrhoid   . Low back pain   . Mastoiditis    noted on brain MRI  . Rib fracture   . Right leg pain   . Shortness of breath   . Stroke [facility]) Oct. 2011   TIA  . Ulcer [facility])      Social History   Social History  . Marital status: Married    Spouse name: N/A  . Number of children: 2  . Years of education: N/A   Occupational History  . Social Worker--Retired    Social History Main Topics  . Smoking status: Former Smoker    Packs/day: 0.50    Years: 20.00    Types: Cigarettes    Quit date: 05/01/1991  . Smokeless  tobacco: Never Used  . Alcohol use No  . Drug use: No  . Sexual activity: Yes   Other Topics Concern  . Not on file   Social History Narrative   Daily caffeine     Past Surgical History:  Procedure Laterality Date  . BREAST LUMPECTOMY     right  . CHOLECYSTECTOMY    . COLONOSCOPY    . SHOULDER SURGERY     right shoulder (dislocation)  . TONSILLECTOMY    . TUBAL LIGATION    . VIDEO BRONCHOSCOPY WITH ENDOBRONCHIAL ULTRASOUND N/A 01/07/2014   Procedure: VIDEO BRONCHOSCOPY WITH ENDOBRONCHIAL ULTRASOUND;  Surgeon: Ivin Poot, MD;  Location: [facility] OR;  Service: Thoracic;  Laterality: N/A;    Family History  Problem Relation Age of Onset  . Heart disease Sister     cerebral vascular disease also  . Heart disease Brother     cerebral vascular disease also  . Heart disease Brother     cerebral vascular disease  . Heart disease Sister     cebreal vascular disease also  . Heart disease Sister     cebreal vascular diease also  . Heart disease Sister  cerebral vascular diease also  . Stomach cancer Father   . Colon cancer Neg Hx   . Esophageal cancer Neg Hx   . Rectal cancer Neg Hx     Allergies  Allergen Reactions  . Ciprofloxacin Anaphylaxis    Current Outpatient Prescriptions on File Prior to Visit  Medication Sig Dispense Refill  . ALPRAZolam (XANAX) 0.25 MG tablet TAKE 1 TABLET BY MOUTH THREE TIMES DAILY AS NEEDED FOR ANXIETY 90 tablet 2  . Calcium-Vitamin D (CALTRATE 600 PLUS-VIT D PO) Take 1 tablet by mouth 2 (two) times daily.     Marland Kitchen CARTIA XT 120 MG 24 hr capsule TAKE 1 CAPSULE BY MOUTH DAILY 90 capsule 1  . Cyanocobalamin (VITAMIN B 12 PO) Take 100 mcg by mouth daily.    . Glucosamine 500 MG CAPS Take by mouth.    . meclizine (ANTIVERT) 25 MG tablet Take 1 tablet (25 mg total) by mouth every 6 (six) hours as needed for dizziness. 60 tablet 3  . Melatonin 10 MG TABS Take 1 tablet by mouth daily.    . Probiotic Product (PROBIOTIC DAILY PO) Take 1 capsule by  mouth once. Takes 1 a day    . simvastatin (ZOCOR) 5 MG tablet TAKE 1 TABLET BY MOUTH DAILY 90 tablet 1  . aspirin EC 81 MG tablet Take 81 mg by mouth daily.     No current facility-administered medications on file prior to visit.     BP 130/86 (BP Location: Left Arm, Patient Position: Sitting, Cuff Size: Normal)   Pulse 89   Temp 97.6 F (36.4 C) (Oral)   Resp 18   Ht '5\' 2"'$  (1.575 m)   Wt 133 lb (60.3 kg)   SpO2 98%   BMI 24.33 kg/m     Review of Systems  Constitutional: Negative.   HENT: Negative for congestion, dental problem, hearing loss, rhinorrhea, sinus pressure, sore throat and tinnitus.   Eyes: Negative for pain, discharge and visual disturbance.  Respiratory: Negative for cough and shortness of breath.   Cardiovascular: Negative for chest pain, palpitations and leg swelling.  Gastrointestinal: Negative for abdominal distention, abdominal pain, blood in stool, constipation, diarrhea, nausea and vomiting.  Genitourinary: Negative for difficulty urinating, dysuria, flank pain, frequency, hematuria, pelvic pain, urgency, vaginal bleeding, vaginal discharge and vaginal pain.  Musculoskeletal: Negative for arthralgias, gait problem and joint swelling.  Skin: Negative for rash.  Neurological: Negative for dizziness, syncope, speech difficulty, weakness, numbness and headaches.  Hematological: Negative for adenopathy.  Psychiatric/Behavioral: Negative for agitation, behavioral problems and dysphoric mood. The patient is not nervous/anxious.        Objective:   Physical Exam  Constitutional: She is oriented to person, place, and time. She appears well-developed and well-nourished.  Anxious No distress.  Blood pressure normal   HENT:  Head: Normocephalic.  Right Ear: External ear normal.  Left Ear: External ear normal.  Mouth/Throat: Oropharynx is clear and moist.  Eyes: Conjunctivae and EOM are normal. Pupils are equal, round, and reactive to light.  Neck: Normal  range of motion. Neck supple. No thyromegaly present.  Cardiovascular: Normal rate, regular rhythm, normal heart sounds and intact distal pulses.   Pulmonary/Chest: Effort normal and breath sounds normal.  Abdominal: Soft. Bowel sounds are normal. She exhibits no mass. There is no tenderness.  Musculoskeletal: Normal range of motion.  Lymphadenopathy:    She has no cervical adenopathy.  Neurological: She is alert and oriented to person, place, and time.  Skin: Skin is warm  and dry. No rash noted.  Psychiatric: She has a normal mood and affect. Her behavior is normal.          Assessment & Plan:   Essential hypertension, stable Cerebrovascular disease.  Patient encouraged to continue daily aspirin Stage IV breast cancer Impaired glucose tolerance.  Patient is concerned about recent elevated blood sugar on lab draw.  This was repeated and was within a normal range.  Nonfasting  Recheck 6 months or as needed Preventative health.  Flu vaccine administered  [name]

## 2016-02-22 NOTE — Patient Instructions (Signed)
Limit your sodium (Salt) intake  Please check your blood pressure on a regular basis.  If it is consistently greater than 150/90, please make an office appointment.  Return in 6 months for follow-up   

## 2016-02-22 NOTE — Progress Notes (Signed)
Pre visit review using our clinic review tool, if applicable. No additional management support is needed unless otherwise documented below in the visit note. 

## 2016-02-23 ENCOUNTER — Ambulatory Visit (HOSPITAL_COMMUNITY)
Admission: RE | Admit: 2016-02-23 | Discharge: 2016-02-23 | Disposition: A | Discharge status: Home / Self Care | Payer: Medicare Other | Source: Ambulatory Visit | Attending: Oncology | Admitting: Oncology

## 2016-02-23 ENCOUNTER — Other Ambulatory Visit (HOSPITAL_BASED_OUTPATIENT_CLINIC_OR_DEPARTMENT_OTHER): Payer: Medicare Other

## 2016-02-23 ENCOUNTER — Other Ambulatory Visit: Payer: Self-pay | Admitting: Oncology

## 2016-02-23 ENCOUNTER — Ambulatory Visit (HOSPITAL_BASED_OUTPATIENT_CLINIC_OR_DEPARTMENT_OTHER): Payer: Medicare Other

## 2016-02-23 ENCOUNTER — Telehealth: Payer: Self-pay | Admitting: *Deleted

## 2016-02-23 ENCOUNTER — Ambulatory Visit (HOSPITAL_BASED_OUTPATIENT_CLINIC_OR_DEPARTMENT_OTHER): Payer: Medicare Other | Admitting: Oncology

## 2016-02-23 VITALS — BP 129/75 | HR 77 | Temp 97.6°F | Resp 18 | Ht 62.0 in | Wt 133.4 lb

## 2016-02-23 DIAGNOSIS — Z5111 Encounter for antineoplastic chemotherapy: Secondary | ICD-10-CM | POA: Diagnosis not present

## 2016-02-23 DIAGNOSIS — Z170 Estrogen receptor positive status [ER+]: Secondary | ICD-10-CM | POA: Insufficient documentation

## 2016-02-23 DIAGNOSIS — Z853 Personal history of malignant neoplasm of breast: Secondary | ICD-10-CM

## 2016-02-23 DIAGNOSIS — C50411 Malignant neoplasm of upper-outer quadrant of right female breast: Secondary | ICD-10-CM

## 2016-02-23 DIAGNOSIS — M79651 Pain in right thigh: Secondary | ICD-10-CM | POA: Diagnosis not present

## 2016-02-23 DIAGNOSIS — C7951 Secondary malignant neoplasm of bone: Secondary | ICD-10-CM

## 2016-02-23 DIAGNOSIS — C50911 Malignant neoplasm of unspecified site of right female breast: Secondary | ICD-10-CM

## 2016-02-23 LAB — COMPREHENSIVE METABOLIC PANEL
ALT: 15 U/L (ref 0–55)
AST: 16 U/L (ref 5–34)
Albumin: 3.6 g/dL (ref 3.5–5.0)
Alkaline Phosphatase: 69 U/L (ref 40–150)
Anion Gap: 9 mEq/L (ref 3–11)
BUN: 11.8 mg/dL (ref 7.0–26.0)
CALCIUM: 9.6 mg/dL (ref 8.4–10.4)
CHLORIDE: 105 meq/L (ref 98–109)
CO2: 28 mEq/L (ref 22–29)
Creatinine: 0.8 mg/dL (ref 0.6–1.1)
EGFR: 71 mL/min/{1.73_m2} — AB (ref >90)
GLUCOSE: 99 mg/dL (ref 70–140)
POTASSIUM: 4.1 meq/L (ref 3.5–5.1)
SODIUM: 142 meq/L (ref 136–145)
Total Bilirubin: 0.43 mg/dL (ref 0.20–1.20)
Total Protein: 7.3 g/dL (ref 6.4–8.3)

## 2016-02-23 LAB — CBC WITH DIFFERENTIAL/PLATELET
BASO%: 0.4 % (ref 0.0–2.0)
Basophils Absolute: 0 10*3/uL (ref 0.0–0.1)
EOS ABS: 0.2 10*3/uL (ref 0.0–0.5)
EOS%: 4.9 % (ref 0.0–7.0)
HEMATOCRIT: 41.3 % (ref 34.8–46.6)
HGB: 13.8 g/dL (ref 11.6–15.9)
LYMPH#: 1.5 10*3/uL (ref 0.9–3.3)
LYMPH%: 30.1 % (ref 14.0–49.7)
MCH: 32.2 pg (ref 25.1–34.0)
MCHC: 33.4 g/dL (ref 31.5–36.0)
MCV: 96.3 fL (ref 79.5–101.0)
MONO#: 0.4 10*3/uL (ref 0.1–0.9)
MONO%: 7.7 % (ref 0.0–14.0)
NEUT%: 56.9 % (ref 38.4–76.8)
NEUTROS ABS: 2.8 10*3/uL (ref 1.5–6.5)
PLATELETS: 153 10*3/uL (ref 145–400)
RBC: 4.29 10*6/uL (ref 3.70–5.45)
RDW: 13.5 % (ref 11.2–14.5)
WBC: 4.9 10*3/uL (ref 3.9–10.3)

## 2016-02-23 MED ORDER — FULVESTRANT 250 MG/5ML IM SOLN
500.0000 mg | Freq: Once | INTRAMUSCULAR | Status: AC
  Administered 2016-02-23: 500 mg via INTRAMUSCULAR
  Filled 2016-02-23: qty 10

## 2016-02-23 NOTE — Telephone Encounter (Signed)
"  This patient was seen today and sent over for xray of rt. Femur.  Our protocol is for two view, AP and lateral."  Office note reads plain films of femur.  Joy will send new order for MD per protocol.

## 2016-02-23 NOTE — Progress Notes (Signed)
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 It was not palpable. She had a right lumpectomy and full sentinel lymph node sampling. She then received adjuvant radiation, to a total of 33 treatments. She received no systemic therapy.  She then did well until December 2014, when she had a fall and complain of pain in her left ribs. Rib films and chest x-ray on 04/13/2013 showed no fractures, but CT of the abdomen and pelvis on the same day found subcarinal and right hilar adenopathy. This was followed up with a chest CT scan 05/05/2013 confirming extensive mediastinal and right hilar lymphadenopathy, with multiple pulmonary nodules, the largest measuring 0.9 cm. PET scan 05/21/2013 showed hypermetabolic adenopathy in the right and left paratracheal areas, the precarinal, subcarinal and right hilar areas, but no involvement of the liver, and the lung nodules were not hypermetabolic (although they were below the level of reliable detection). In addition, at L5 there was a lucency measuring 1.2 cm.  The PET scan also showed a hypermetabolic focus on the thyroid gland, which was evaluated with  neck ultrasound and biopsy 25/08/3974, showing a follicular lesion of undetermined significance (([id number]).  The patient was referred to pulmonary [Dr Melvyn Novas and Dr Julien Nordmann for further evaluation. Repeat PET scan 12/28/2013 showed, in addition to the adenopathy previously noted, now multiple bony metastases. On 01/07/2014 the patient underwent bronchoscopy and this showed ([id number], together with separate cytology] a low-grade mucinous invasive breast cancer, estrogen receptor 100% positive, progesterone receptor 14% positive, with no HER-2 amplification, the signals ratio being 1.30 and the number per cell 1.95.  The patient was started on anastrozole 01/22/2014; monthly denosumab/Xgeva was added 07/30/2014. She appeared to tolerate this well, and her CA-27-29 (127 at baseline) normalized. Most recent CT scans of chest abdomen and pelvis 10/03/2015 showed continuing response. Despite this good news however, the patient decided to go off anastrozole and denosumab/Xgeva as of June 2017. By the time she saw Dr. Earlie Server again in September 2017 her tumor marker had doubled. At that time the patient was referred to the breast clinic for a second opinion regarding further evaluation and treatment.  INTERVAL HISTORY: Corleen returns today for follow-up of her metastatic breast cancer accompanied by her husband Mikki Santee. She received her first dose of fulvestrant 4 weeks ago. She tolerated it well. She had pain with the shot, of course, but "I can deal with it". She also had a little bit of itching, all over. That has resolved. She is ready to proceed with routine treatments including a dose today  She had also been scheduled to start denosumab today, but adamantly refuses. She tells me she is terrified of the possibility of osteonecrosis of the jaw. She tells me this  is the reason she left her other physician. Accordingly there is no plan for Delton See today are in the future   REVIEW OF SYSTEMS: Shehas pain in the  right thigh area. This is not constant but very intermittent. Some days it doesn't happen at all, other days it occurs, that it is not constant. It is not affected by whether she is walking or not, and in fact she walks just fine, and is able to stand up from a sitting position without pushing off. She climbs stairs without difficulty. She tells me she has cataracts but doesn't want to have them taking care of until she knows along she is going to live. She has mild sinus problems. She has palpitations by which she is referring to a feeling that her heartbeat is very strong when she lies on her left side. It is not irregular. She has short of breath when walking up stairs but gets to the top without stopping. She has mild heartburn. She feels anxious, but not depressed. A detailed review of systems today was otherwise stable   PAST MEDICAL HISTORY: Past Medical History:  Diagnosis Date  . Allergy   . Anxiety   . Bone cancer ([place]) mri 11/11/14   right parietal bone,left cribriform plate metastases  . Cancer [facility]) 2007   right breat ca  , lumpectomy and radiation tx (declined chemo and additional prophylactic meds)  . CEREBROVASCULAR DISEASE 03/03/2010  . Diverticulitis   . DIVERTICULOSIS, COLON 03/03/2010  . Fatty liver 08/02/09   as per U/S done by [facility]  . Foramen ovale    positive bubble study  . Headache(784.0)    she thinks sinus headaches  . History of cerebral artery stenosis    right middle  . HYPERLIPIDEMIA 03/03/2010  . Hypertension   . HYPOTHYROIDISM 03/03/2010   no longer on meds  . Internal hemorrhoid   . Low back pain   . Mastoiditis    noted on brain MRI  . Rib fracture   . Right leg pain   . Shortness of breath   . Stroke [facility]) Oct. 2011   TIA  . Ulcer ([place])     PAST SURGICAL HISTORY: Past Surgical History:  Procedure Laterality Date  . BREAST LUMPECTOMY     right  . CHOLECYSTECTOMY    . COLONOSCOPY    . SHOULDER SURGERY     right shoulder  (dislocation)  . TONSILLECTOMY    . TUBAL LIGATION    . VIDEO BRONCHOSCOPY WITH ENDOBRONCHIAL ULTRASOUND N/A 01/07/2014   Procedure: VIDEO BRONCHOSCOPY WITH ENDOBRONCHIAL ULTRASOUND;  Surgeon: Ivin Poot, MD;  Location: [facility] OR;  Service: Thoracic;  Laterality: N/A;    FAMILY HISTORY Family History  Problem Relation Age of Onset  . Heart disease Sister     cerebral vascular disease also  . Heart disease Brother     cerebral vascular disease also  . Heart disease Brother     cerebral vascular disease  . Heart disease Sister     cebreal vascular disease also  . Heart disease Sister     cebreal vascular diease also  . Heart disease Sister     cerebral vascular diease also  . Stomach cancer Father   . Colon cancer Neg Hx   . Esophageal cancer Neg Hx   . Rectal cancer Neg Hx   The patient's father died from stomach cancer in his late 54s. The patient's mother died in her 45s from a stroke. The patient  has 2 brothers, 4 sisters. One sister had leukemia. There is no history of breast, colon, or ovarian cancer in the family to her knowledge  GYNECOLOGIC HISTORY:  No LMP recorded. Patient is postmenopausal. Menarche age 72, first live birth age 47, the patient is [place] P2. She went through menopause in her late 2s. She took no hormone replacement. She took oral contraceptives for approximately 2 years remotely without complications.  SOCIAL HISTORY:  Angeleah is originally from [place]. 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 Her husband Mortimer Fries is a retired substance abuse Social worker. This is a second marriage for both of them. Mazi has 2 children from her first marriage, [name], lives in [place], and worked as a Audiological scientist but is now disabled, and Lawrence Santiago, who lives in [place] and works in Engineer, mining. The patient has 6 grandchildren. She attends [employer].--Mikki Santee has 3 children from his first marriage. [name]  Junior lives in [place] and has his own trucking business. He tells me he is estranged from his 2 daughters Amedeo Gory (a retired Pharmacist, hospital) and Salena Saner (who works in a bank). They living [place].    ADVANCED DIRECTIVES: in place   HEALTH MAINTENANCE: Social History  Substance Use Topics  . Smoking status: Former Smoker    Packs/day: 0.50    Years: 20.00    Types: Cigarettes    Quit date: 05/01/1991  . Smokeless tobacco: Never Used  . Alcohol use No     Colonoscopy:  PAP:  Bone density:   Allergies  Allergen Reactions  . Ciprofloxacin Anaphylaxis    Current Outpatient Prescriptions  Medication Sig Dispense Refill  . Fulvestrant (FASLODEX IM) Inject into the muscle.    Marland Kitchen ALPRAZolam (XANAX) 0.25 MG tablet Take 1 tablet (0.25 mg total) by mouth 3 (three) times daily as needed. for anxiety 90 tablet 2  . aspirin EC 81 MG tablet Take 81 mg by mouth daily.    . Calcium-Vitamin D (CALTRATE 600 PLUS-VIT D PO) Take 1 tablet by mouth 2 (two) times daily.     Marland Kitchen CARTIA XT 120 MG 24 hr capsule TAKE 1 CAPSULE BY MOUTH DAILY 90 capsule 1  . Cyanocobalamin (VITAMIN B 12 PO) Take 100 mcg by mouth daily.    . Glucosamine 500 MG CAPS Take by mouth.    . meclizine (ANTIVERT) 25 MG tablet Take 1 tablet (25 mg total) by mouth every 6 (six) hours as needed for dizziness. 60 tablet 3  . Melatonin 10 MG TABS Take 1 tablet by mouth daily.    . Probiotic Product (PROBIOTIC DAILY PO) Take 1 capsule by mouth once. Takes 1 a day    . simvastatin (ZOCOR) 5 MG tablet TAKE 1 TABLET BY MOUTH DAILY 90 tablet 1   No current facility-administered medications for this visit.     OBJECTIVE: Older Latin American woman who In no acute distress Vitals:   02/23/16 0819  BP: 129/75  Pulse: 77  Resp: 18  Temp: 97.6 F (36.4 C)     Body mass index is 24.4 kg/m.    ECOG FS:1 - Symptomatic but completely ambulatory  Sclerae unicteric, pupils round and equal Oropharynx clear and moist-- no thrush or other  lesions No cervical or supraclavicular adenopathy Lungs no rales or rhonchi Heart regular rate and rhythm Abd soft, nontender, positive bowel sounds MSK no focal spinal tenderness, no upper extremity lymphedema, bilateral straight leg raising to about 60  without difficulty Neuro: nonfocal, well oriented, appropriate affect Breasts: Deferred    LAB RESULTS:  CMP     Component Value Date/Time   NA 139 01/26/2016 0836   K 3.8 01/26/2016 0836   CL 104 01/06/2014 0910   CO2 22 01/26/2016 0836   GLUCOSE 130 01/26/2016 0836   BUN 10.8 01/26/2016 0836   CREATININE 0.9 01/26/2016 0836   CALCIUM 8.8 01/26/2016 0836   PROT 6.6 01/26/2016 0836   ALBUMIN 3.1 (L) 01/26/2016 0836   AST 20 01/26/2016 0836   ALT 36 01/26/2016 0836   ALKPHOS 75 01/26/2016 0836   BILITOT 0.38 01/26/2016 0836   GFRNONAA >90 01/06/2014 0910   GFRAA >90 01/06/2014 0910    INo results found for: SPEP, UPEP  Lab Results  Component Value Date   WBC 5.4 01/26/2016   NEUTROABS 3.5 01/26/2016   HGB 12.2 01/26/2016   HCT 36.9 01/26/2016   MCV 96.2 01/26/2016   PLT 164 01/26/2016      Chemistry      Component Value Date/Time   NA 139 01/26/2016 0836   K 3.8 01/26/2016 0836   CL 104 01/06/2014 0910   CO2 22 01/26/2016 0836   BUN 10.8 01/26/2016 0836   CREATININE 0.9 01/26/2016 0836      Component Value Date/Time   CALCIUM 8.8 01/26/2016 0836   ALKPHOS 75 01/26/2016 0836   AST 20 01/26/2016 0836   ALT 36 01/26/2016 0836   BILITOT 0.38 01/26/2016 0836       Lab Results  Component Value Date   LABCA2 25 08/30/2015    No components found for: [id number]  No results for input(s): INR in the last 168 hours.  Urinalysis    Component Value Date/Time   COLORURINE YELLOW 11/18/2013 1321   APPEARANCEUR CLEAR 11/18/2013 1321   LABSPEC 1.008 11/18/2013 1321   PHURINE 7.5 11/18/2013 1321   GLUCOSEU NEGATIVE 11/18/2013 1321   HGBUR NEGATIVE 11/18/2013 1321   BILIRUBINUR neg 02/16/2015 1318    KETONESUR NEGATIVE 11/18/2013 1321   PROTEINUR neg 02/16/2015 1318   PROTEINUR NEGATIVE 11/18/2013 1321   UROBILINOGEN 0.2 02/16/2015 1318   UROBILINOGEN 0.2 11/18/2013 1321   NITRITE neg 02/16/2015 1318   NITRITE NEGATIVE 11/18/2013 1321   LEUKOCYTESUR Negative 02/16/2015 1318     STUDIES:  Ct Chest W Contrast  Result Date: 01/27/2016 CLINICAL DATA:  Restaging metastatic breast cancer. EXAM: CT CHEST WITH CONTRAST TECHNIQUE: Multidetector CT imaging of the chest was performed during intravenous contrast administration. CONTRAST:  60m ISOVUE-300 IOPAMIDOL (ISOVUE-300) INJECTION 61% COMPARISON:  Chest CT 10/03/2015 FINDINGS: Chest wall: No breast masses are identified. Stable surgical changes involving the right breast and right axillary lymph node dissection. Persistence subareolar density involving the left breast. This is stable since PET-CT 12/28/2013 and is likely benign. Cardiovascular: The heart is normal in size. No pericardial effusion. Stable mild tortuosity, ectasia and calcification of the thoracic aorta. No dissection. The branch vessels are patent. Stable scattered coronary artery calcifications. Mediastinum/Nodes: Persistent mediastinal and hilar lymphadenopathy. High pretracheal node on image number 40 measures 9 mm and previous measured 7 mm. Aorticopulmonary window adenopathy measures approximately 15 mm and previously measured 10 mm. Subcarinal adenopathy on image number 66 measures 14 mm and previously measured 10 mm. Right hilar nodes on image number 72 measures 13 mm and previously measured 10 mm. Right internal mammary lymph node on image number 45 measures 7 mm and previous measured 5 mm. Lungs/Pleura: Stable nodular soft tissue thickening in the right  lower lobe posteriorly. The largest area of nodularity measures 8.5 mm and previously measured 5 mm. Subpleural nodule in the left upper lobe on image number 81 measures 5 mm and is stable. Small nodule just above the right  hemidiaphragm on image number 81 measures 7 and previously measured 5.5 mm. Upper Abdomen: No significant upper abdominal findings. No hepatic or adrenal gland metastasis. Musculoskeletal: Stable left fifth posterior rib lesion. No new bone disease. IMPRESSION: 1. Slight progression of mediastinal and hilar lymphadenopathy as detailed above. 2. The pulmonary lesions are relatively stable. The nodular density in the right lower lobe is slightly larger. 3. Stable bone lesions. Electronically Signed   By: Marijo Sanes M.D.   On: 01/27/2016 14:32   Nm Bone Scan Whole Body  Result Date: 01/27/2016 CLINICAL DATA:  Follow-up bony metastatic disease, breast cancer EXAM: NUCLEAR MEDICINE WHOLE BODY BONE SCAN TECHNIQUE: Whole body anterior and posterior images were obtained approximately 3 hours after intravenous injection of radiopharmaceutical. RADIOPHARMACEUTICALS:  19.4 mCi Technetium-24mMDP IV COMPARISON:  04/20/2015 FINDINGS: A whole-body bone scan was performed. Again noted focal increased activity right calvarium, thoracic and lumbar spine, bilateral ribs and right femur without change from prior exam. No definite new foci of increased activity noted. IMPRESSION: Stable bony metastatic disease without definite evidence of new focal increased activity. Electronically Signed   By: LLahoma CrockerM.D.   On: 01/27/2016 17:02     ELIGIBLE FOR AVAILABLE RESEARCH PROTOCOL: no  ASSESSMENT: 75y.o. [place] woman  (1) status post right breast upper outer quadrant lumpectomy and axillary lymph node dissection in 2007 for what appears to have been a T1 N0, stage IA invasive ductal carcinoma, treated adjuvantly with radiation (33 sessions)  METASTATIC DISEASE definitively documented Sept 2015 (2) bronchoscopic biopsy 01/07/2014 showed a low-grade mucinous breast cancer, strongly estrogen receptor positive, progesterone receptor positive, HER-2 negative; staging studies confirmed extensive hypermetabolic adenopathy,  multiple bone lesions, likely early lung involvement, but no liver lesions.  (3) on Arimidex between September 2015 and June 2017, with evidence of response; discontinued secondary to side effects  (4) on monthly denosumab/Xgeva between 07/30/2014 and 10/04/2015, discontinued due to patient's concerns regarding osteonecrosis of the jaw  (a) discussed again October 2017, the patient adamantly refusing denosumab  (5) started fulvestrant 01/26/2016   PLAN Tereasa is tolerating the fulvestrant without any unusual side effects and the plan is to continue that until we have evidence of disease progression.  Today we again discussed denosumab. She tells me the reason she left her prior physician is that they were pushing her to take that medication. She tells me she is terrified of osteonecrosis of the jaw and that under no circumstances will she accept Xgeva, Zometa, or oral bisphosphonates. Any medication that could possibly cause osteonecrosis of the jaw is to be excluded.  Accordingly we are proceeding with fulvestrant alone today. I did discuss starting polyp palbociclib/Ibrance with her at the next visit. I gave her the name in writing so she could do a little bit of reading on it before we started. She understands that this double the time to disease progression in patients taking fulvestrant. Hopefully she will be agreeable to giving it a try.  She may be interested in radiation to her right femur area, which is the only place where she is having pain from her bone disease. We are obtaining a plain film today. We can set her up for a meeting with radiation oncology depending on results  She knows to  call for any problems that may develop before the next scheduled visit.   :Chauncey Cruel, MD   02/23/2016 8:48 AM Medical Oncology and Hematology [facility] [place], [place] [address] Tel. [phone]    Fax. [phone]

## 2016-02-24 LAB — CANCER ANTIGEN 27.29: CAN 27.29: 74.2 U/mL — AB (ref 0.0–38.6)

## 2016-03-21 ENCOUNTER — Ambulatory Visit (HOSPITAL_BASED_OUTPATIENT_CLINIC_OR_DEPARTMENT_OTHER): Payer: Medicare Other | Admitting: Oncology

## 2016-03-21 ENCOUNTER — Other Ambulatory Visit (HOSPITAL_BASED_OUTPATIENT_CLINIC_OR_DEPARTMENT_OTHER): Payer: Medicare Other

## 2016-03-21 ENCOUNTER — Ambulatory Visit (HOSPITAL_BASED_OUTPATIENT_CLINIC_OR_DEPARTMENT_OTHER): Payer: Medicare Other

## 2016-03-21 VITALS — BP 147/75 | HR 78 | Temp 97.6°F | Resp 18

## 2016-03-21 VITALS — BP 138/73 | HR 81 | Temp 98.1°F | Resp 18 | Ht 62.0 in | Wt 132.4 lb

## 2016-03-21 DIAGNOSIS — C50911 Malignant neoplasm of unspecified site of right female breast: Secondary | ICD-10-CM

## 2016-03-21 DIAGNOSIS — Z853 Personal history of malignant neoplasm of breast: Secondary | ICD-10-CM

## 2016-03-21 DIAGNOSIS — Z5111 Encounter for antineoplastic chemotherapy: Secondary | ICD-10-CM

## 2016-03-21 DIAGNOSIS — C50411 Malignant neoplasm of upper-outer quadrant of right female breast: Secondary | ICD-10-CM | POA: Diagnosis not present

## 2016-03-21 DIAGNOSIS — C7951 Secondary malignant neoplasm of bone: Secondary | ICD-10-CM

## 2016-03-21 DIAGNOSIS — M79651 Pain in right thigh: Secondary | ICD-10-CM | POA: Diagnosis not present

## 2016-03-21 DIAGNOSIS — Z170 Estrogen receptor positive status [ER+]: Secondary | ICD-10-CM

## 2016-03-21 LAB — COMPREHENSIVE METABOLIC PANEL
ALBUMIN: 3.5 g/dL (ref 3.5–5.0)
ALK PHOS: 72 U/L (ref 40–150)
ALT: 15 U/L (ref 0–55)
AST: 16 U/L (ref 5–34)
Anion Gap: 10 mEq/L (ref 3–11)
BUN: 14 mg/dL (ref 7.0–26.0)
CO2: 27 meq/L (ref 22–29)
Calcium: 9.4 mg/dL (ref 8.4–10.4)
Chloride: 104 mEq/L (ref 98–109)
Creatinine: 0.7 mg/dL (ref 0.6–1.1)
EGFR: 79 mL/min/{1.73_m2} — AB (ref >90)
GLUCOSE: 108 mg/dL (ref 70–140)
POTASSIUM: 3.8 meq/L (ref 3.5–5.1)
SODIUM: 141 meq/L (ref 136–145)
Total Bilirubin: 0.34 mg/dL (ref 0.20–1.20)
Total Protein: 7.1 g/dL (ref 6.4–8.3)

## 2016-03-21 LAB — CBC WITH DIFFERENTIAL/PLATELET
BASO%: 0.5 % (ref 0.0–2.0)
Basophils Absolute: 0 10*3/uL (ref 0.0–0.1)
EOS%: 6.1 % (ref 0.0–7.0)
Eosinophils Absolute: 0.3 10*3/uL (ref 0.0–0.5)
HEMATOCRIT: 40.1 % (ref 34.8–46.6)
HEMOGLOBIN: 13.6 g/dL (ref 11.6–15.9)
LYMPH#: 1.8 10*3/uL (ref 0.9–3.3)
LYMPH%: 31.5 % (ref 14.0–49.7)
MCH: 32.3 pg (ref 25.1–34.0)
MCHC: 33.9 g/dL (ref 31.5–36.0)
MCV: 95.2 fL (ref 79.5–101.0)
MONO#: 0.3 10*3/uL (ref 0.1–0.9)
MONO%: 6.1 % (ref 0.0–14.0)
NEUT#: 3.1 10*3/uL (ref 1.5–6.5)
NEUT%: 55.8 % (ref 38.4–76.8)
PLATELETS: 155 10*3/uL (ref 145–400)
RBC: 4.21 10*6/uL (ref 3.70–5.45)
RDW: 13.1 % (ref 11.2–14.5)
WBC: 5.6 10*3/uL (ref 3.9–10.3)

## 2016-03-21 MED ORDER — FULVESTRANT 250 MG/5ML IM SOLN
500.0000 mg | Freq: Once | INTRAMUSCULAR | Status: AC
  Administered 2016-03-21: 500 mg via INTRAMUSCULAR
  Filled 2016-03-21: qty 10

## 2016-03-21 NOTE — Patient Instructions (Signed)

## 2016-03-21 NOTE — Progress Notes (Signed)
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 It was not palpable. She had a right lumpectomy and full sentinel lymph node sampling. She then received adjuvant radiation, to a total of 33 treatments. She received no systemic therapy.  She then did well until December 2014, when she had a fall and complain of pain in her left ribs. Rib films and chest x-ray on 04/13/2013 showed no fractures, but CT of the abdomen and pelvis on the same day found subcarinal and right hilar adenopathy. This was followed up with a chest CT scan 05/05/2013 confirming extensive mediastinal and right hilar lymphadenopathy, with multiple pulmonary nodules, the largest measuring 0.9 cm. PET scan 05/21/2013 showed hypermetabolic adenopathy in the right and left paratracheal areas, the precarinal, subcarinal and right hilar areas, but no involvement of the liver, and the lung nodules were not hypermetabolic (although they were below the level of reliable detection). In addition, at L5 there was a lucency measuring 1.2 cm.  The PET scan also showed a hypermetabolic focus on the thyroid gland, which was evaluated with  neck ultrasound and biopsy 85/06/7739, showing a follicular lesion of undetermined significance (([id number]).  The patient was referred to pulmonary [Dr Melvyn Novas and Dr Julien Nordmann for further evaluation. Repeat PET scan 12/28/2013 showed, in addition to the adenopathy previously noted, now multiple bony metastases. On 01/07/2014 the patient underwent bronchoscopy and this showed ([id number], together with separate cytology] a low-grade mucinous invasive breast cancer, estrogen receptor 100% positive, progesterone receptor 14% positive, with no HER-2 amplification, the signals ratio being 1.30 and the number per cell 1.95.  The patient was started on anastrozole 01/22/2014; monthly denosumab/Xgeva was added 07/30/2014. She appeared to tolerate this well, and her CA-27-29 (127 at baseline) normalized. Most recent CT scans of chest abdomen and pelvis 10/03/2015 showed continuing response. Despite this good news however, the patient decided to go off anastrozole and denosumab/Xgeva as of June 2017. By the time she saw Dr. Earlie Server again in September 2017 her tumor marker had doubled. At that time the patient was referred to the breast clinic for a second opinion regarding further evaluation and treatment.  INTERVAL HISTORY: Erica Young returns today for follow-up of her estrogen receptor positive breast cancer accompanied by her husband Erica Young. Shareka is receiving fulvestrant every 28 days and she is tolerating it well.  She was not aware that Faslodex is the same thing as fulvestrant and she thought the word muscle Mantz that they were giving her the injection in her arm, but they are not giving her of course injection in the armpit and the gluteus. We clarified all those issues today.  REVIEW OF SYSTEMS: The pain in her right thigh area is very intermittent. Many days is not  there at all. When it does come it is brief. Aside from that issue she tells me she has to have some teeth removed. She blames that on her  earlier radiation. She again emphasized the fact that she is terrified of osteonecrosis of the jaw. Aside from these issues a detailed review of systems today was stable  PAST MEDICAL HISTORY: Past Medical History:  Diagnosis Date  . Allergy   . Anxiety   . Bone cancer ([place]) mri 11/11/14   right parietal bone,left cribriform plate metastases  . Cancer [facility]) 2007   right breat ca  , lumpectomy and radiation tx (declined chemo and additional prophylactic meds)  . CEREBROVASCULAR DISEASE 03/03/2010  . Diverticulitis   . DIVERTICULOSIS, COLON 03/03/2010  . Fatty liver 08/02/09   as per U/S done by [facility]  . Foramen ovale    positive bubble study  . Headache(784.0)    she thinks sinus headaches  . History of cerebral artery stenosis    right middle  . HYPERLIPIDEMIA 03/03/2010  . Hypertension   . HYPOTHYROIDISM 03/03/2010   no longer on meds  . Internal hemorrhoid   . Low back pain   . Mastoiditis    noted on brain MRI  . Rib fracture   . Right leg pain   . Shortness of breath   . Stroke [facility]) Oct. 2011   TIA  . Ulcer ([place])     PAST SURGICAL HISTORY: Past Surgical History:  Procedure Laterality Date  . BREAST LUMPECTOMY     right  . CHOLECYSTECTOMY    . COLONOSCOPY    . SHOULDER SURGERY     right shoulder (dislocation)  . TONSILLECTOMY    . TUBAL LIGATION    . VIDEO BRONCHOSCOPY WITH ENDOBRONCHIAL ULTRASOUND N/A 01/07/2014   Procedure: VIDEO BRONCHOSCOPY WITH ENDOBRONCHIAL ULTRASOUND;  Surgeon: Ivin Poot, MD;  Location: [facility] OR;  Service: Thoracic;  Laterality: N/A;    FAMILY HISTORY Family History  Problem Relation Age of Onset  . Heart disease Sister     cerebral vascular disease also  . Heart disease Brother     cerebral vascular disease also  . Heart disease Brother     cerebral vascular disease  . Heart disease Sister     cebreal vascular disease also  . Heart disease Sister     cebreal vascular diease also  . Heart disease Sister      cerebral vascular diease also  . Stomach cancer Father   . Colon cancer Neg Hx   . Esophageal cancer Neg Hx   . Rectal cancer Neg Hx   The patient's father died from stomach cancer in his late 25s. The patient's mother died in her 73s from a stroke. The patient has 2 brothers, 4 sisters. One sister had leukemia. There is no history of breast, colon, or ovarian cancer in the family to her knowledge  GYNECOLOGIC HISTORY:  No LMP recorded. Patient is postmenopausal. Menarche age 33, first live birth age 14, the patient is [place] P2. She went through menopause in her late 69s. She took no hormone replacement. She took oral contraceptives for approximately 2 years remotely without complications.  SOCIAL HISTORY:  Doninique is originally from [place]. 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 Her husband Erica Young is a retired substance abuse Social worker. This is a second marriage for both of them. Akari has 2 children from her first marriage, Erica Young, lives in [place],  and worked as a Audiological scientist but is now disabled, and Erica Young, who lives in [place] and works in Engineer, mining. The patient has 6 grandchildren. She attends [employer]Erica Young has 3 children from his first marriage. Erica Young lives in [place] and has his own trucking business. He tells me he is estranged from his 2 daughters Erica Young (a retired Pharmacist, hospital) and Erica Young (who works in a bank). They living [place].    ADVANCED DIRECTIVES: in place   HEALTH MAINTENANCE: Social History  Substance Use Topics  . Smoking status: Former Smoker    Packs/day: 0.50    Years: 20.00    Types: Cigarettes    Quit date: 05/01/1991  . Smokeless tobacco: Never Used  . Alcohol use No     Colonoscopy:  PAP:  Bone density:   Allergies  Allergen Reactions  . Ciprofloxacin Anaphylaxis    Current Outpatient Prescriptions  Medication Sig Dispense Refill  . ALPRAZolam (XANAX)  0.25 MG tablet Take 1 tablet (0.25 mg total) by mouth 3 (three) times daily as needed. for anxiety 90 tablet 2  . aspirin EC 81 MG tablet Take 81 mg by mouth daily.    . Calcium-Vitamin D (CALTRATE 600 PLUS-VIT D PO) Take 1 tablet by mouth 2 (two) times daily.     Marland Kitchen CARTIA XT 120 MG 24 hr capsule TAKE 1 CAPSULE BY MOUTH DAILY 90 capsule 1  . Cyanocobalamin (VITAMIN B 12 PO) Take 100 mcg by mouth daily.    . Fulvestrant (FASLODEX IM) Inject into the muscle.    . Glucosamine 500 MG CAPS Take by mouth.    . meclizine (ANTIVERT) 25 MG tablet Take 1 tablet (25 mg total) by mouth every 6 (six) hours as needed for dizziness. 60 tablet 3  . Melatonin 10 MG TABS Take 1 tablet by mouth daily.    . Probiotic Product (PROBIOTIC DAILY PO) Take 1 capsule by mouth once. Takes 1 a day    . simvastatin (ZOCOR) 5 MG tablet TAKE 1 TABLET BY MOUTH DAILY 90 tablet 1   No current facility-administered medications for this visit.     OBJECTIVE: Older Latin American woman who Appears stated age 75:   03/21/16 0841  BP: 138/73  Pulse: 81  Resp: 18  Temp: 98.1 F (36.7 C)     Body mass index is 24.22 kg/m.    ECOG FS:1 - Symptomatic but completely ambulatory  Sclerae unicteric, EOMs intact Oropharynx clear and moist No cervical or supraclavicular adenopathy Lungs no rales or rhonchi Heart regular rate and rhythm Abd soft, nontender, positive bowel sounds MSK no focal spinal tenderness, no upper extremity lymphedema Neuro: nonfocal, well oriented, appropriate affect Breasts: Deferred    LAB RESULTS:  CMP     Component Value Date/Time   NA 142 02/23/2016 0855   K 4.1 02/23/2016 0855   CL 104 01/06/2014 0910   CO2 28 02/23/2016 0855   GLUCOSE 99 02/23/2016 0855   BUN 11.8 02/23/2016 0855   CREATININE 0.8 02/23/2016 0855   CALCIUM 9.6 02/23/2016 0855   PROT 7.3 02/23/2016 0855   ALBUMIN 3.6 02/23/2016 0855   AST 16 02/23/2016 0855   ALT 15 02/23/2016 0855   ALKPHOS 69 02/23/2016 0855    BILITOT 0.43 02/23/2016 0855   GFRNONAA >90 01/06/2014 0910   GFRAA >90 01/06/2014 0910    INo results found for: SPEP, UPEP  Lab Results  Component Value Date   WBC 4.9 02/23/2016   NEUTROABS  2.8 02/23/2016   HGB 13.8 02/23/2016   HCT 41.3 02/23/2016   MCV 96.3 02/23/2016   PLT 153 02/23/2016      Chemistry      Component Value Date/Time   NA 142 02/23/2016 0855   K 4.1 02/23/2016 0855   CL 104 01/06/2014 0910   CO2 28 02/23/2016 0855   BUN 11.8 02/23/2016 0855   CREATININE 0.8 02/23/2016 0855      Component Value Date/Time   CALCIUM 9.6 02/23/2016 0855   ALKPHOS 69 02/23/2016 0855   AST 16 02/23/2016 0855   ALT 15 02/23/2016 0855   BILITOT 0.43 02/23/2016 0855       Lab Results  Component Value Date   LABCA2 25 08/30/2015    No components found for: QGBEE100  No results for input(s): INR in the last 168 hours.  Urinalysis    Component Value Date/Time   COLORURINE YELLOW 11/18/2013 1321   APPEARANCEUR CLEAR 11/18/2013 1321   LABSPEC 1.008 11/18/2013 1321   PHURINE 7.5 11/18/2013 1321   GLUCOSEU NEGATIVE 11/18/2013 1321   HGBUR NEGATIVE 11/18/2013 1321   BILIRUBINUR neg 02/16/2015 1318   KETONESUR NEGATIVE 11/18/2013 1321   PROTEINUR neg 02/16/2015 1318   PROTEINUR NEGATIVE 11/18/2013 1321   UROBILINOGEN 0.2 02/16/2015 1318   UROBILINOGEN 0.2 11/18/2013 1321   NITRITE neg 02/16/2015 1318   NITRITE NEGATIVE 11/18/2013 1321   LEUKOCYTESUR Negative 02/16/2015 1318     STUDIES:  Dg Femur, Min 2 Views Right  Result Date: 02/23/2016 CLINICAL DATA:  Right thigh pain, history of carcinoma the breast EXAM: RIGHT FEMUR 2 VIEWS COMPARISON:  None. FINDINGS: There is no evidence of fracture or other focal bone lesions. Soft tissues are unremarkable. IMPRESSION: No acute abnormality noted. Electronically Signed   By: Inez Catalina M.D.   On: 02/23/2016 11:02     ELIGIBLE FOR AVAILABLE RESEARCH PROTOCOL: no  ASSESSMENT: 75 y.o. [place] woman  (1)  status post right breast upper outer quadrant lumpectomy and axillary lymph node dissection in 2007 for what appears to have been a T1 N0, stage IA invasive ductal carcinoma, treated adjuvantly with radiation (33 sessions)  METASTATIC DISEASE definitively documented Sept 2015 (2) bronchoscopic biopsy 01/07/2014 showed a low-grade mucinous breast cancer, strongly estrogen receptor positive, progesterone receptor positive, HER-2 negative; staging studies confirmed extensive hypermetabolic adenopathy, multiple bone lesions, likely early lung involvement, but no liver lesions.  (3) on Arimidex between September 2015 and June 2017, with evidence of response; discontinued secondary to side effects  (4) on monthly denosumab/Xgeva between 07/30/2014 and 10/04/2015, discontinued due to patient's concerns regarding osteonecrosis of the jaw  (a) discussed again October 2017, the patient adamantly refusing denosumab  (5) started fulvestrant 01/26/2016   PLAN Rhegan has accepted the fulvestrant treatment and today we went over again the various names of this drug, how it works, and the fact that it has nothing to do with osteonecrosis of the jaw.  We reviewed the pain in her right thigh and I told her I will be glad to refer her to radiation oncology if she wishes to deal with that particular metastatic deposit which is the only one that is causing her any symptoms. At present she prefers not to proceed with radiation since the pain is very intermittent and manageable, she says.  I think she would benefit from tai chi and I gave her information on how to participate on our very safe tai chi program here on Wednesday mornings.  She has not had  a mammogram in more than a year and she has not had a bone density scan for several years as best as I can tell. We are going to obtain both those next month and we will discuss the results of the next visit.  I asked her to look up palbociclib and start reading up on  it since I would like to add it if possible to her fulvestrant. We will again discuss that at the next visit.  She tells me she has a great deal of faith in me and even more faith in [place]. She pretty much once to see me every month at least for the time being. We are operational lysing  She knows to call for any problems that may develop before her next visit.  :Chauncey Cruel, MD   03/21/2016 9:16 AM Medical Oncology and Hematology [facility] [place], [place] [address] Tel. [phone]    Fax. [phone]

## 2016-03-22 LAB — CANCER ANTIGEN 27.29: CAN 27.29: 64.7 U/mL — AB (ref 0.0–38.6)

## 2016-04-02 ENCOUNTER — Telehealth: Payer: Self-pay | Admitting: Internal Medicine

## 2016-04-02 NOTE — Telephone Encounter (Signed)
Spoke to pt, told her we did not do any lab work on her that day just a finger stick in the room. Pt verbalized understanding and said sorry it was another provider.

## 2016-04-02 NOTE — Telephone Encounter (Signed)
Pt was her seen 10/25 and would like results of her labs done then. Ok to leave message, but will be in and out.

## 2016-04-10 ENCOUNTER — Ambulatory Visit: Admission: RE | Admit: 2016-04-10 | Payer: Medicare Other | Source: Ambulatory Visit

## 2016-04-10 ENCOUNTER — Other Ambulatory Visit: Payer: Self-pay | Admitting: Oncology

## 2016-04-10 ENCOUNTER — Ambulatory Visit
Admission: RE | Admit: 2016-04-10 | Discharge: 2016-04-10 | Disposition: A | Discharge status: Home / Self Care | Payer: Medicare Other | Source: Ambulatory Visit | Attending: Oncology | Admitting: Oncology

## 2016-04-10 DIAGNOSIS — Z853 Personal history of malignant neoplasm of breast: Secondary | ICD-10-CM

## 2016-04-10 DIAGNOSIS — M8589 Other specified disorders of bone density and structure, multiple sites: Secondary | ICD-10-CM | POA: Diagnosis not present

## 2016-04-10 DIAGNOSIS — R928 Other abnormal and inconclusive findings on diagnostic imaging of breast: Secondary | ICD-10-CM | POA: Diagnosis not present

## 2016-04-10 DIAGNOSIS — C50411 Malignant neoplasm of upper-outer quadrant of right female breast: Secondary | ICD-10-CM

## 2016-04-10 DIAGNOSIS — C50911 Malignant neoplasm of unspecified site of right female breast: Secondary | ICD-10-CM

## 2016-04-10 DIAGNOSIS — Z780 Asymptomatic menopausal state: Secondary | ICD-10-CM | POA: Diagnosis not present

## 2016-04-10 DIAGNOSIS — Z170 Estrogen receptor positive status [ER+]: Principal | ICD-10-CM

## 2016-04-10 DIAGNOSIS — C7951 Secondary malignant neoplasm of bone: Secondary | ICD-10-CM

## 2016-04-16 ENCOUNTER — Other Ambulatory Visit: Payer: Self-pay | Admitting: Oncology

## 2016-04-16 NOTE — Progress Notes (Unsigned)
Ms. Erica Young or came today very concerned because her husband is having further scalp surgery to remove the margins of the recently resected melanoma, and all this is happening on the same day and at the same time that she supposed to see me. We are moving her appointment from 1221 06/11/2018. She knows to call for any other issues that may develop before then.

## 2016-04-17 ENCOUNTER — Other Ambulatory Visit: Payer: Self-pay | Admitting: Internal Medicine

## 2016-04-18 ENCOUNTER — Ambulatory Visit (HOSPITAL_BASED_OUTPATIENT_CLINIC_OR_DEPARTMENT_OTHER): Payer: Medicare Other | Admitting: Oncology

## 2016-04-18 ENCOUNTER — Other Ambulatory Visit (HOSPITAL_BASED_OUTPATIENT_CLINIC_OR_DEPARTMENT_OTHER): Payer: Medicare Other

## 2016-04-18 ENCOUNTER — Ambulatory Visit (HOSPITAL_BASED_OUTPATIENT_CLINIC_OR_DEPARTMENT_OTHER): Payer: Medicare Other

## 2016-04-18 VITALS — BP 141/75 | HR 73 | Temp 97.9°F | Resp 18 | Ht 62.0 in | Wt 129.8 lb

## 2016-04-18 DIAGNOSIS — C50911 Malignant neoplasm of unspecified site of right female breast: Secondary | ICD-10-CM

## 2016-04-18 DIAGNOSIS — Z5111 Encounter for antineoplastic chemotherapy: Secondary | ICD-10-CM

## 2016-04-18 DIAGNOSIS — M8580 Other specified disorders of bone density and structure, unspecified site: Secondary | ICD-10-CM

## 2016-04-18 DIAGNOSIS — C50411 Malignant neoplasm of upper-outer quadrant of right female breast: Secondary | ICD-10-CM

## 2016-04-18 DIAGNOSIS — Z170 Estrogen receptor positive status [ER+]: Secondary | ICD-10-CM | POA: Diagnosis not present

## 2016-04-18 DIAGNOSIS — C7951 Secondary malignant neoplasm of bone: Secondary | ICD-10-CM

## 2016-04-18 DIAGNOSIS — F419 Anxiety disorder, unspecified: Secondary | ICD-10-CM | POA: Diagnosis not present

## 2016-04-18 DIAGNOSIS — Z853 Personal history of malignant neoplasm of breast: Secondary | ICD-10-CM

## 2016-04-18 LAB — COMPREHENSIVE METABOLIC PANEL
ALT: 15 U/L (ref 0–55)
ANION GAP: 7 meq/L (ref 3–11)
AST: 17 U/L (ref 5–34)
Albumin: 3.8 g/dL (ref 3.5–5.0)
Alkaline Phosphatase: 68 U/L (ref 40–150)
BUN: 11.5 mg/dL (ref 7.0–26.0)
CHLORIDE: 102 meq/L (ref 98–109)
CO2: 30 meq/L — AB (ref 22–29)
CREATININE: 0.8 mg/dL (ref 0.6–1.1)
Calcium: 9.2 mg/dL (ref 8.4–10.4)
EGFR: 70 mL/min/{1.73_m2} — ABNORMAL LOW (ref >90)
Glucose: 129 mg/dl (ref 70–140)
Potassium: 4.2 mEq/L (ref 3.5–5.1)
Sodium: 139 mEq/L (ref 136–145)
Total Bilirubin: 0.39 mg/dL (ref 0.20–1.20)
Total Protein: 7.2 g/dL (ref 6.4–8.3)

## 2016-04-18 LAB — CBC WITH DIFFERENTIAL/PLATELET
BASO%: 0.4 % (ref 0.0–2.0)
BASOS ABS: 0 10*3/uL (ref 0.0–0.1)
EOS ABS: 0.1 10*3/uL (ref 0.0–0.5)
EOS%: 2.4 % (ref 0.0–7.0)
HEMATOCRIT: 40.3 % (ref 34.8–46.6)
HEMOGLOBIN: 13.7 g/dL (ref 11.6–15.9)
LYMPH#: 1.6 10*3/uL (ref 0.9–3.3)
LYMPH%: 28.4 % (ref 14.0–49.7)
MCH: 32.8 pg (ref 25.1–34.0)
MCHC: 34 g/dL (ref 31.5–36.0)
MCV: 96.4 fL (ref 79.5–101.0)
MONO#: 0.3 10*3/uL (ref 0.1–0.9)
MONO%: 6.2 % (ref 0.0–14.0)
NEUT#: 3.5 10*3/uL (ref 1.5–6.5)
NEUT%: 62.6 % (ref 38.4–76.8)
PLATELETS: 159 10*3/uL (ref 145–400)
RBC: 4.18 10*6/uL (ref 3.70–5.45)
RDW: 13 % (ref 11.2–14.5)
WBC: 5.5 10*3/uL (ref 3.9–10.3)

## 2016-04-18 MED ORDER — PALBOCICLIB 75 MG PO CAPS: 75.0000 mg | ORAL_CAPSULE | Freq: Every day | ORAL | 6 refills | Status: DC

## 2016-04-18 MED ORDER — FULVESTRANT 250 MG/5ML IM SOLN
500.0000 mg | Freq: Once | INTRAMUSCULAR | Status: AC
  Administered 2016-04-18: 500 mg via INTRAMUSCULAR
  Filled 2016-04-18: qty 10

## 2016-04-18 NOTE — Progress Notes (Signed)
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 It was not palpable. She had a right lumpectomy and full sentinel lymph node sampling. She then received adjuvant radiation, to a total of 33 treatments. She received no systemic therapy.  She then did well until December 2014, when she had a fall and complain of pain in her left ribs. Rib films and chest x-ray on 04/13/2013 showed no fractures, but CT of the abdomen and pelvis on the same day found subcarinal and right hilar adenopathy. This was followed up with a chest CT scan 05/05/2013 confirming extensive mediastinal and right hilar lymphadenopathy, with multiple pulmonary nodules, the largest measuring 0.9 cm. PET scan 05/21/2013 showed hypermetabolic adenopathy in the right and left paratracheal areas, the precarinal, subcarinal and right hilar areas, but no involvement of the liver, and the lung nodules were not hypermetabolic (although they were below the level of reliable detection). In addition, at L5 there was a lucency measuring 1.2 cm.  The PET scan also showed a hypermetabolic focus on the thyroid gland,  which was evaluated with neck ultrasound and biopsy 07/37/1062, showing a follicular lesion of undetermined significance (([id number]).  The patient was referred to pulmonary [Dr Erica Young and Dr Erica Young for further evaluation. Repeat PET scan 12/28/2013 showed, in addition to the adenopathy previously noted, now multiple bony metastases. On 01/07/2014 the patient underwent bronchoscopy and this showed (SZA [id number], together with separate cytology] a low-grade mucinous invasive breast cancer, estrogen receptor 100% positive, progesterone receptor 14% positive, with no HER-2 amplification, the signals ratio being 1.30 and the number per cell 1.95.  The patient was started on anastrozole 01/22/2014; monthly denosumab/Xgeva was added 07/30/2014. She appeared to tolerate this well, and her CA-27-29 (127 at baseline) normalized. Most recent CT scans of chest abdomen and pelvis 10/03/2015 showed continuing response. Despite this good news however, the patient decided to go off anastrozole and denosumab/Xgeva as of June 2017. By the time she saw Dr. Earlie Young again in September 2017 her tumor marker had doubled. At that time the patient was referred to the breast clinic for a second opinion regarding further evaluation and treatment.  INTERVAL HISTORY: Erica Young returns today for follow-up of her stage IV breast cancer accompanied by her husband Erica Young. Brean will receive her fourth dose of fulvestrant today. Aside from the pain for the injection she tolerates it well and she "doesn't mind" that discomfort. She sometimes gets little "bumps" on her buttocks from the shots, but "they go away after a while".  She is now ready to discuss palbociclib.  REVIEW OF SYSTEMS: She is very stressed because Erica Young had more surgery for his melanoma and still did not get a clean margin. He is scheduled  for further surgery as week. She sleeps poorly, feels chilly, has blurred vision, sometimes her nose is running and she has sinus  problems. She has hoarseness at times. She has a little bit of gas but otherwise has normal bowel movements, and she has joint pains chiefly in her back and hands. She feels forgetful and anxious but denies depression. A detailed review of systems today was otherwise stable.  PAST MEDICAL HISTORY: Past Medical History:  Diagnosis Date  . Allergy   . Anxiety   . Bone cancer ([place]) mri 11/11/14   right parietal bone,left cribriform plate metastases  . Cancer [facility]) 2007   right breat ca  , lumpectomy and radiation tx (declined chemo and additional prophylactic meds)  . CEREBROVASCULAR DISEASE 03/03/2010  . Diverticulitis   . DIVERTICULOSIS, COLON 03/03/2010  . Fatty liver 08/02/09   as per U/S done by [facility]  . Foramen ovale    positive bubble study  . Headache(784.0)    she thinks sinus headaches  . History of cerebral artery stenosis    right middle  . HYPERLIPIDEMIA 03/03/2010  . Hypertension   . HYPOTHYROIDISM 03/03/2010   no longer on meds  . Internal hemorrhoid   . Low back pain   . Mastoiditis    noted on brain MRI  . Rib fracture   . Right leg pain   . Shortness of breath   . Stroke [facility]) Oct. 2011   TIA  . Ulcer ([place])     PAST SURGICAL HISTORY: Past Surgical History:  Procedure Laterality Date  . BREAST LUMPECTOMY     right  . CHOLECYSTECTOMY    . COLONOSCOPY    . SHOULDER SURGERY     right shoulder (dislocation)  . TONSILLECTOMY    . TUBAL LIGATION    . VIDEO BRONCHOSCOPY WITH ENDOBRONCHIAL ULTRASOUND N/A 01/07/2014   Procedure: VIDEO BRONCHOSCOPY WITH ENDOBRONCHIAL ULTRASOUND;  Surgeon: Ivin Poot, MD;  Location: [facility] OR;  Service: Thoracic;  Laterality: N/A;    FAMILY HISTORY Family History  Problem Relation Age of Onset  . Heart disease Sister     cerebral vascular disease also  . Heart disease Brother     cerebral vascular disease also  . Heart disease Brother     cerebral vascular disease  . Heart disease Sister     cebreal  vascular disease also  . Heart disease Sister     cebreal vascular diease also  . Heart disease Sister     cerebral vascular diease also  . Stomach cancer Father   . Colon cancer Neg Hx   . Esophageal cancer Neg Hx   . Rectal cancer Neg Hx   The patient's father died from stomach cancer in his late 39s. The patient's mother died in her 18s from a stroke. The patient has 2 brothers, 4 sisters. One sister had leukemia. There is no history of breast, colon, or ovarian cancer in the family to her knowledge  GYNECOLOGIC HISTORY:  No LMP recorded. Patient is postmenopausal. Menarche age 18, first live birth age 46, the patient is [place] P2. She went through menopause in her late 14s. She took no hormone replacement. She took oral contraceptives for approximately 2 years remotely without complications.  SOCIAL HISTORY:  Erica Young is originally from [place]. 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 Her husband Mortimer Fries is a retired substance abuse Social worker. This is a second marriage for both of them. Erica Young  has 2 children from her first marriage, Erica Young, lives in [place], and worked as a Audiological scientist but is now disabled, and Erica Young, who lives in [place] and works in Engineer, mining. The patient has 6 grandchildren. She attends [employer]Erica Young has 3 children from his first marriage. Erica Young lives in [place] and has his own trucking business. He tells me he is estranged from his 2 daughters Erica Young (a retired Pharmacist, hospital) and Erica Young (who works in a bank). They living [place].    ADVANCED DIRECTIVES: in place   HEALTH MAINTENANCE: Social History  Substance Use Topics  . Smoking status: Former Smoker    Packs/day: 0.50    Years: 20.00    Types: Cigarettes    Quit date: 05/01/1991  . Smokeless tobacco: Never Used  . Alcohol use No     Colonoscopy:  PAP:  Bone density:   Allergies  Allergen Reactions  .  Ciprofloxacin Anaphylaxis    Current Outpatient Prescriptions  Medication Sig Dispense Refill  . ALPRAZolam (XANAX) 0.25 MG tablet Take 1 tablet (0.25 mg total) by mouth 3 (three) times daily as needed. for anxiety 90 tablet 2  . aspirin EC 81 MG tablet Take 81 mg by mouth daily.    . Calcium-Vitamin D (CALTRATE 600 PLUS-VIT D PO) Take 1 tablet by mouth 2 (two) times daily.     Marland Kitchen CARTIA XT 120 MG 24 hr capsule TAKE 1 CAPSULE BY MOUTH DAILY 90 capsule 1  . Cyanocobalamin (VITAMIN B 12 PO) Take 100 mcg by mouth daily.    . Fulvestrant (FASLODEX IM) Inject into the muscle.    . Glucosamine 500 MG CAPS Take by mouth.    . meclizine (ANTIVERT) 25 MG tablet TAKE 1 TABLET(25 MG) BY MOUTH EVERY 6 HOURS AS NEEDED FOR DIZZINESS 60 tablet 0  . Melatonin 10 MG TABS Take 1 tablet by mouth daily.    . palbociclib (IBRANCE) 75 MG capsule Take 1 capsule (75 mg total) by mouth daily with breakfast. Take whole with food. 21 capsule 6  . Probiotic Product (PROBIOTIC DAILY PO) Take 1 capsule by mouth once. Takes 1 a day    . simvastatin (ZOCOR) 5 MG tablet TAKE 1 TABLET BY MOUTH DAILY 90 tablet 1   No current facility-administered medications for this visit.     OBJECTIVE: Older Latin American woman In no acute distress Vitals:   04/18/16 1112  BP: (!) 141/75  Pulse: 73  Resp: 18  Temp: 97.9 F (36.6 C)     Body mass index is 23.74 kg/m.    ECOG FS:1 - Symptomatic but completely ambulatory  Sclerae unicteric, pupils round and equal Oropharynx clear and moist-- no thrush or other lesions No cervical or supraclavicular adenopathy Lungs no rales or rhonchi Heart regular rate and rhythm Abd soft, nontender, positive bowel sounds MSK no focal spinal tenderness, no upper extremity lymphedema Neuro: nonfocal, well oriented, appropriate affect Breasts: Deferred  LAB RESULTS:  CMP     Component Value Date/Time   NA 139 04/18/2016 1058   K 4.2 04/18/2016 1058   CL 104 01/06/2014 0910   CO2 30 (H)  04/18/2016 1058   GLUCOSE 129 04/18/2016 1058   BUN 11.5 04/18/2016 1058   CREATININE 0.8 04/18/2016 1058   CALCIUM 9.2 04/18/2016 1058   PROT 7.2 04/18/2016 1058   ALBUMIN 3.8 04/18/2016 1058   AST 17 04/18/2016 1058   ALT 15 04/18/2016 1058   ALKPHOS 68 04/18/2016 1058  BILITOT 0.39 04/18/2016 1058   GFRNONAA >90 01/06/2014 0910   GFRAA >90 01/06/2014 0910    INo results found for: SPEP, UPEP  Lab Results  Component Value Date   WBC 5.5 04/18/2016   NEUTROABS 3.5 04/18/2016   HGB 13.7 04/18/2016   HCT 40.3 04/18/2016   MCV 96.4 04/18/2016   PLT 159 04/18/2016      Chemistry      Component Value Date/Time   NA 139 04/18/2016 1058   K 4.2 04/18/2016 1058   CL 104 01/06/2014 0910   CO2 30 (H) 04/18/2016 1058   BUN 11.5 04/18/2016 1058   CREATININE 0.8 04/18/2016 1058      Component Value Date/Time   CALCIUM 9.2 04/18/2016 1058   ALKPHOS 68 04/18/2016 1058   AST 17 04/18/2016 1058   ALT 15 04/18/2016 1058   BILITOT 0.39 04/18/2016 1058       Lab Results  Component Value Date   LABCA2 25 08/30/2015    No components found for: [id number]  No results for input(s): INR in the last 168 hours.  Urinalysis    Component Value Date/Time   COLORURINE YELLOW 11/18/2013 1321   APPEARANCEUR CLEAR 11/18/2013 1321   LABSPEC 1.008 11/18/2013 1321   PHURINE 7.5 11/18/2013 1321   GLUCOSEU NEGATIVE 11/18/2013 1321   HGBUR NEGATIVE 11/18/2013 1321   BILIRUBINUR neg 02/16/2015 1318   KETONESUR NEGATIVE 11/18/2013 1321   PROTEINUR neg 02/16/2015 1318   PROTEINUR NEGATIVE 11/18/2013 1321   UROBILINOGEN 0.2 02/16/2015 1318   UROBILINOGEN 0.2 11/18/2013 1321   NITRITE neg 02/16/2015 1318   NITRITE NEGATIVE 11/18/2013 1321   LEUKOCYTESUR Negative 02/16/2015 1318     STUDIES:  Dg Bone Density (dxa)  Result Date: 04/10/2016 EXAM: DUAL X-RAY ABSORPTIOMETRY (DXA) FOR BONE MINERAL DENSITY IMPRESSION: Referring Physician:  Chauncey Young PATIENT: Name: Erica Young, Erica Young  Patient ID: [id number] Birth Date: 1940/06/17 Height: 62.0 in. Sex: Female Measured: 04/10/2016 Weight: 129.0 lbs. Indications: Advanced Age, Breast Cancer History, Estrogen Deficient, Hx of tobacco use, Postmenopausal Fractures: None Treatments: Calcium (E943.0), Vitamin D (E933.5) ASSESSMENT: The BMD measured at Femur Neck Right is 0.742 g/cm2 with a T-score of -2.1. This patient is considered osteopenic according to [facility]) criteria. Lumbar spine was not utilized due to advanced degenerative changes. Site Region Measured Date Measured Age YA BMD Significant CHANGE T-score DualFemur Neck Right 04/10/2016 75.9 -2.1 0.742 g/cm2 Left Forearm Radius 33% 04/10/2016 75.9 -1.4 0.761 g/cm2 World Health Organization [facility]) criteria for post-menopausal, Caucasian Women: Normal       T-score at or above -1 SD Osteopenia   T-score between -1 and -2.5 SD Osteoporosis T-score at or below -2.5 SD RECOMMENDATION: [place] recommends that FDA-approved medical therapies be considered in postmenopausal women and men age 36 or older with a: 1. Hip or vertebral (clinical or morphometric) fracture. 2. T-score of <-2.5 at the spine or hip. 3. Ten-year fracture probability by FRAX of 3% or greater for hip fracture or 20% or greater for major osteoporotic fracture. All treatment decisions require clinical judgment and consideration of individual patient factors, including patient preferences, co-morbidities, previous drug use, risk factors not captured in the FRAX model (e.g. falls, vitamin D deficiency, increased bone turnover, interval significant decline in bone density) and possible under - or over-estimation of fracture risk by FRAX. All patients should ensure an adequate intake of dietary calcium (1200 mg/d) and vitamin D (800 IU daily) unless contraindicated. FOLLOW-UP: People with diagnosed cases of osteoporosis  or at high risk for fracture should have regular bone mineral density  tests. For patients eligible for Medicare, routine testing is allowed once every 2 years. The testing frequency can be increased to one year for patients who have rapidly progressing disease, those who are receiving or discontinuing medical therapy to restore bone mass, or have additional risk factors. I have reviewed this report, and agree with the above findings. [facility] FRAX* 10-year Probability of Fracture Based on femoral neck BMD: DualFemur (Right) Major Osteoporotic Fracture: 8.3% Hip Fracture:                2.3% Population:                  [place] (Hispanic) Risk Factors:                None *FRAX is a Materials engineer of the [employer] for Metabolic Bone Disease, a World Pharmacologist (WHO) [facility]. ASSESSMENT: The probability of a major osteoporotic fracture is 8.3 % within the next ten years. The probability of a hip fracture is 2.3 % within the next ten years. Electronically Signed   By: Lahoma Crocker M.D.   On: 04/10/2016 15:29   Mm Diag Breast Tomo Bilateral  Result Date: 04/10/2016 CLINICAL DATA:  75 year old female with history of stage IV right breast can post lumpectomy and radiation therapy in 2007. EXAM: 2D DIGITAL DIAGNOSTIC BILATERAL MAMMOGRAM WITH CAD AND ADJUNCT TOMO COMPARISON:  Previous exam(s). ACR Breast Density Category b: There are scattered areas of fibroglandular density. FINDINGS: No suspicious masses or calcifications are seen in either breast. Postsurgical changes are present in the slightly upper outer right breast related to prior lumpectomy. There are no mammographic findings of malignancy in the right breast. Mammographic images were processed with CAD. IMPRESSION: Stable surgical site in the right breast. No mammographic evidence of malignancy in the right breast. RECOMMENDATION: Patient is receiving treatment for metastatic right breast cancer. Recommend annual routine screening mammography as clinically indicated.  I have discussed the findings and recommendations with the patient. Results were also provided in writing at the conclusion of the visit. If applicable, a reminder letter will be sent to the patient regarding the next appointment. BI-RADS CATEGORY  2: Benign. Electronically Signed   By: Everlean Alstrom M.D.   On: 04/10/2016 15:45     ELIGIBLE FOR AVAILABLE RESEARCH PROTOCOL: no  ASSESSMENT: 75 y.o. [place] woman  (1) status post right breast upper outer quadrant lumpectomy and axillary lymph node dissection in 2007 for what appears to have been a T1 N0, stage IA invasive ductal carcinoma, treated adjuvantly with radiation (33 sessions)  METASTATIC DISEASE definitively documented Sept 2015 (2) bronchoscopic biopsy 01/07/2014 showed a low-grade mucinous breast cancer, strongly estrogen receptor positive, progesterone receptor positive, HER-2 negative; staging studies confirmed extensive hypermetabolic adenopathy, multiple bone lesions, likely early lung involvement, but no liver lesions.  (3) on Arimidex between September 2015 and June 2017, with evidence of response; discontinued secondary to side effects  (4) on monthly denosumab/Xgeva between 07/30/2014 and 10/04/2015, discontinued due to patient's concerns regarding osteonecrosis of the jaw  (a) discussed again October 2017, the patient adamantly refusing denosumab  (5) started fulvestrant 01/26/2016   (a) to add palbociclib at 75 mg daily, 21/7, beginning mid January 2018  PLAN I spent approximately 30 minutes today with Mirna Mires and her husband going over her situation. She is doing very well with the fulvestrant. She is very motivated and  does not mind the pain of the shots. She is now ready to start palbociclib and she actually reminded me that we were going to talk about it today.  We went over the mechanism of action of site clean inhibitors and the specifics of how to take palbociclib. We also discussed the possible toxicities side  effects and complications of this agent.  Because of cost issues of course this is obtained through specialty pharmacies. I went in place the order today but it is not clear exactly when she will be able to receive it. I suggested we go ahead and started around the time of her next fulvestrant dose, which will be the third week in January. Once she does started we are going to check her labs every 2 weeks and if she tolerates it well in terms of labs then we will go back to monthly labs as before.  We also discussed the fact that after she has been 6 months on Faslodex we should take a look at her scans so I am setting her up for repeat bone scan and CT scan of the chest before she sees me mid March.  She is using Xanax for anxiety and stress. We also discussed exercise and she is considering coming here for yoga. Finally we discussed her bone density which does show osteopenia. We are continuing the calcium and vitamin D supplementation for now  She knows to call for any problems that may develop before her next visit here. :Erica Cruel, MD   04/18/2016 11:52 AM Medical Oncology and Hematology [facility] [place], [place] [id number] Tel. [phone]    Fax. [phone]

## 2016-04-19 ENCOUNTER — Telehealth: Payer: Self-pay | Admitting: Pharmacist

## 2016-04-19 ENCOUNTER — Other Ambulatory Visit: Payer: Medicare Other

## 2016-04-19 ENCOUNTER — Ambulatory Visit: Payer: Medicare Other | Admitting: Oncology

## 2016-04-19 ENCOUNTER — Ambulatory Visit: Payer: Medicare Other

## 2016-04-19 LAB — CANCER ANTIGEN 27.29: CAN 27.29: 61 U/mL — AB (ref 0.0–38.6)

## 2016-04-19 NOTE — Telephone Encounter (Signed)
Oral Chemotherapy Pharmacist Encounter  Received notification from [facility] that patient's Erica Young would require prior authorization. We did not receive original prescription prior to being sent to pharmacy. Labs from 04/18/16 reviewed, OK for treatment  Current medication list in Epic assessed, some DDIs with Ibrance identified  Ibrance and diltiazem, Ibrance and simvastatin, Ibrance and alprazolam. 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 This could lead to decreased metabolism of the diltiazem, simvastatin, and alprazolam, leading to increased exposure to these 3 agents. No change to therapy is indicated at this time, this will be monitored.  Prior authorziation submitted on CoverMyMeds.com Key [id number] Status is pending  Oral Oncology Clinic will continue to follow.  Johny Drilling, PharmD, BCPS, BCOP 04/19/2016  4:34 PM Oral Oncology Clinic [phone]

## 2016-04-24 ENCOUNTER — Other Ambulatory Visit: Payer: Self-pay | Admitting: *Deleted

## 2016-04-26 NOTE — Telephone Encounter (Signed)
Oral Chemotherapy Pharmacist Encounter  Received notification from [facility] that prior authorization for patient's Erica Young has been approved. Auth dates: 04/21/16 - 10/20/16  I alerted [facility] to re-run prescription. Copay $2726.30. I requested [facility] use free voucher from [employer] for 1st fill and to call patient when it is ready to be picked up. I spoke with patient about copayment and received permission to apply for foundation grants on patient's behalf.  I successfully enrolled patient for copayment assistance funds through the [employer] [facility]) Award amount: $5000 Award dates: 10/29/15-04/26/17 Billing information: ID: [id number]  BIN: [id number]  Group: [id number]  PCN: [id number]  This PAF billing information has been sent to [facility] to put on patient's file for January 2018 fill and beyond.  Patient updated. She expressed understanding and appreciation.  Oral Oncology Clinic will continue to follow for start date, initial counseling, toxicity and adherence management.  Johny Drilling, PharmD, BCPS, BCOP 04/26/2016  12:59 PM Oral Oncology Clinic [phone]

## 2016-05-02 ENCOUNTER — Telehealth: Payer: Self-pay | Admitting: Pharmacist

## 2016-05-02 MED FILL — IBRANCE 75 MG CAPSULE: 75 | 21 days supply | Qty: 21 | Fill #0

## 2016-05-02 NOTE — Telephone Encounter (Signed)
Called pt to offer Ibrance counseling.  Pts husband answered and stated pt at dentist.  Pts husband stated pt is having increased constipation and has appt w/ PCP to discuss.  He wonders if it could be the Faslodex injections.  I informed him the incidence is 4-20% w/ Faslodex. I also told pts husband that Leslee Home has the potential to cause diarrhea.  Pt will call back this evening or tomorrow to initiate counseling by phone. Plan to begin Ibrance ~05/17/16. The Rx is ready for pick up at [facility] OP Rx and is free this month.  After that pt has a $5000 grant.  Pt will need q2 week labs once she starts Ibrance as per MD last office visit.  Kennith Center, Pharm.D., CPP 05/02/2016'@3'$ :[facility]

## 2016-05-03 ENCOUNTER — Telehealth: Payer: Self-pay | Admitting: *Deleted

## 2016-05-03 NOTE — Telephone Encounter (Signed)
Oral Chemotherapy Pharmacist Encounter  Received message from Dr. Virgie Dad RN that patient had called and was unsure of when to start Ibrance medication. Per last MD not, start date should be around patient's next Faslodex injection, which is scheduled for 05/17/16.  I called patient and spoke to her husband. Ibrance start date of 05/17/16 confirmed with husband. All questions answered.  Patient will need CBC check early February 2018. Kennith Center, PharmD, CPP has already reached out to MD to schedule.  We also received physician verification form from PAF. Form completed and faxed back to [phone].  Johny Drilling, PharmD, BCPS, BCOP 05/03/2016  2:42 PM Oral Oncology Clinic [phone]

## 2016-05-03 NOTE — Telephone Encounter (Signed)
Message left this AM from pt stating she is returning call and " when can I start my new medication and I am confused"  Noted pt was contacted by oral chemo pharmacist - this RN contacted oral chemo pharmacist to request call be returned due to noted discussion with husband yesterday with request for pt to return call.

## 2016-05-04 ENCOUNTER — Ambulatory Visit: Payer: Medicare Other | Admitting: Internal Medicine

## 2016-05-08 ENCOUNTER — Other Ambulatory Visit: Payer: Self-pay | Admitting: *Deleted

## 2016-05-11 ENCOUNTER — Encounter: Payer: Self-pay | Admitting: Internal Medicine

## 2016-05-11 ENCOUNTER — Telehealth: Payer: Self-pay | Admitting: *Deleted

## 2016-05-11 ENCOUNTER — Ambulatory Visit (INDEPENDENT_AMBULATORY_CARE_PROVIDER_SITE_OTHER): Payer: Medicare Other | Admitting: Internal Medicine

## 2016-05-11 VITALS — BP 126/72 | HR 77 | Temp 97.4°F | Ht 62.0 in | Wt 128.6 lb

## 2016-05-11 DIAGNOSIS — C50911 Malignant neoplasm of unspecified site of right female breast: Secondary | ICD-10-CM | POA: Diagnosis not present

## 2016-05-11 DIAGNOSIS — I10 Essential (primary) hypertension: Secondary | ICD-10-CM | POA: Diagnosis not present

## 2016-05-11 MED ORDER — PANTOPRAZOLE SODIUM 20 MG PO TBEC: 20.0000 mg | DELAYED_RELEASE_TABLET | Freq: Every day | ORAL | 0 refills | Status: DC

## 2016-05-11 NOTE — Progress Notes (Signed)
Subjective:    Patient ID: Erica Young, female    DOB: 02/13/1941, 76 y.o.   MRN: [id number]  HPI  76 year old patient who has a history of stage IV breast cancer.  She is scheduled for oncological follow-up next week.  For the past 2 weeks she has had diffuse abdominal pain.  Pain seems a maximal through the night.  She also describes increase gaseousness.  Wt Readings from Last 3 Encounters:  05/11/16 128 lb 9.6 oz (58.3 kg)  04/18/16 129 lb 12.8 oz (58.9 kg)  03/21/16 132 lb 6.4 oz (60.1 kg)   No nausea or vomiting  Laboratory studies were checked last month. CT of the chest in September 2017, revealed some regression of pulmonary metastatic disease and hilar adenopathy.  She has significant bony metastatic disease CT of the abdomen and June of last year revealed no hepatic disease or intra-abdominal disease  Past Medical History:  Diagnosis Date  . Allergy   . Anxiety   . Bone cancer ([place]) mri 11/11/14   right parietal bone,left cribriform plate metastases  . Cancer [facility]) 2007   right breat ca  , lumpectomy and radiation tx (declined chemo and additional prophylactic meds)  . CEREBROVASCULAR DISEASE 03/03/2010  . Diverticulitis   . DIVERTICULOSIS, COLON 03/03/2010  . Fatty liver 08/02/09   as per U/S done by [facility]  . Foramen ovale    positive bubble study  . Headache(784.0)    she thinks sinus headaches  . History of cerebral artery stenosis    right middle  . HYPERLIPIDEMIA 03/03/2010  . Hypertension   . HYPOTHYROIDISM 03/03/2010   no longer on meds  . Internal hemorrhoid   . Low back pain   . Mastoiditis    noted on brain MRI  . Rib fracture   . Right leg pain   . Shortness of breath   . Stroke [facility]) Oct. 2011   TIA  . Ulcer [facility])      Social History   Social History  . Marital status: Married    Spouse name: N/A  . Number of children: 2  . Years of education: N/A   Occupational History  . Social Worker--Retired    Social History  Main Topics  . Smoking status: Former Smoker    Packs/day: 0.50    Years: 20.00    Types: Cigarettes    Quit date: 05/01/1991  . Smokeless tobacco: Never Used  . Alcohol use No  . Drug use: No  . Sexual activity: Yes   Other Topics Concern  . Not on file   Social History Narrative   Daily caffeine     Past Surgical History:  Procedure Laterality Date  . BREAST LUMPECTOMY     right  . CHOLECYSTECTOMY    . COLONOSCOPY    . SHOULDER SURGERY     right shoulder (dislocation)  . TONSILLECTOMY    . TUBAL LIGATION    . VIDEO BRONCHOSCOPY WITH ENDOBRONCHIAL ULTRASOUND N/A 01/07/2014   Procedure: VIDEO BRONCHOSCOPY WITH ENDOBRONCHIAL ULTRASOUND;  Surgeon: Ivin Poot, MD;  Location: [facility] OR;  Service: Thoracic;  Laterality: N/A;    Family History  Problem Relation Age of Onset  . Heart disease Sister     cerebral vascular disease also  . Heart disease Brother     cerebral vascular disease also  . Heart disease Brother     cerebral vascular disease  . Heart disease Sister     cebreal vascular disease also  .  Heart disease Sister     cebreal vascular diease also  . Heart disease Sister     cerebral vascular diease also  . Stomach cancer Father   . Colon cancer Neg Hx   . Esophageal cancer Neg Hx   . Rectal cancer Neg Hx     Allergies  Allergen Reactions  . Ciprofloxacin Anaphylaxis    Current Outpatient Prescriptions on File Prior to Visit  Medication Sig Dispense Refill  . ALPRAZolam (XANAX) 0.25 MG tablet Take 1 tablet (0.25 mg total) by mouth 3 (three) times daily as needed. for anxiety 90 tablet 2  . aspirin EC 81 MG tablet Take 81 mg by mouth daily.    . Calcium-Vitamin D (CALTRATE 600 PLUS-VIT D PO) Take 1 tablet by mouth 2 (two) times daily.     Marland Kitchen CARTIA XT 120 MG 24 hr capsule TAKE 1 CAPSULE BY MOUTH DAILY 90 capsule 1  . Cyanocobalamin (VITAMIN B 12 PO) Take 100 mcg by mouth daily.    . Fulvestrant (FASLODEX IM) Inject into the muscle.    . Glucosamine 500  MG CAPS Take by mouth.    . meclizine (ANTIVERT) 25 MG tablet TAKE 1 TABLET(25 MG) BY MOUTH EVERY 6 HOURS AS NEEDED FOR DIZZINESS 60 tablet 0  . Melatonin 10 MG TABS Take 1 tablet by mouth daily.    . palbociclib (IBRANCE) 75 MG capsule Take 1 capsule (75 mg total) by mouth daily with breakfast. Take whole with food. 21 capsule 6  . Probiotic Product (PROBIOTIC DAILY PO) Take 1 capsule by mouth once. Takes 1 a day    . simvastatin (ZOCOR) 5 MG tablet TAKE 1 TABLET BY MOUTH DAILY 90 tablet 1   No current facility-administered medications on file prior to visit.     BP 126/72 (BP Location: Left Arm, Patient Position: Sitting, Cuff Size: Normal)   Pulse 77   Temp 97.4 F (36.3 C) (Oral)   Ht '5\' 2"'$  (1.575 m)   Wt 128 lb 9.6 oz (58.3 kg)   SpO2 97%   BMI 23.52 kg/m     Review of Systems  Constitutional: Positive for activity change, appetite change and fatigue.  HENT: Negative for congestion, dental problem, hearing loss, rhinorrhea, sinus pressure, sore throat and tinnitus.   Eyes: Negative for pain, discharge and visual disturbance.  Respiratory: Negative for cough and shortness of breath.   Cardiovascular: Negative for chest pain, palpitations and leg swelling.  Gastrointestinal: Positive for abdominal pain and constipation. Negative for abdominal distention, blood in stool, diarrhea, nausea and vomiting.  Genitourinary: Negative for difficulty urinating, dysuria, flank pain, frequency, hematuria, pelvic pain, urgency, vaginal bleeding, vaginal discharge and vaginal pain.  Musculoskeletal: Negative for arthralgias, gait problem and joint swelling.  Skin: Negative for rash.  Neurological: Negative for dizziness, syncope, speech difficulty, weakness, numbness and headaches.  Hematological: Negative for adenopathy.  Psychiatric/Behavioral: Negative for agitation, behavioral problems and dysphoric mood. The patient is not nervous/anxious.        Objective:   Physical Exam    Constitutional: She is oriented to person, place, and time. She appears well-developed and well-nourished. No distress.  Anxious  No distress Afebrile Blood pressure well controlled  HENT:  Head: Normocephalic.  Right Ear: External ear normal.  Left Ear: External ear normal.  Mouth/Throat: Oropharynx is clear and moist.  Eyes: Conjunctivae and EOM are normal. Pupils are equal, round, and reactive to light.  Neck: Normal range of motion. Neck supple. No thyromegaly present.  Cardiovascular:  Normal rate, regular rhythm, normal heart sounds and intact distal pulses.   Pulmonary/Chest: Effort normal.  Decreased breath sounds right base  Abdominal: Soft. Bowel sounds are normal. She exhibits no distension and no mass. There is no tenderness. There is no rebound and no guarding.  Musculoskeletal: Normal range of motion.  Lymphadenopathy:    She has no cervical adenopathy.  Neurological: She is alert and oriented to person, place, and time.  Skin: Skin is warm and dry. No rash noted.  Psychiatric: She has a normal mood and affect. Her behavior is normal.          Assessment & Plan:   Stage IV right breast cancer with extensive pulmonary and bony metastatic disease Abdominal pain of 2 weeks' duration.  Patient had laboratory studies performed within the past 4 weeks and is scheduled for oncology follow-up in 6 days. We'll treat with PPI therapy and bedtime dose of Mylanta 2.  Oncology will consider further evaluation next week Essential hypertension  Analyn Matusek Pilar Plate

## 2016-05-11 NOTE — Patient Instructions (Signed)
Follow-up with Dr. Jana Hakim on January 18 as scheduled  Protonix 1 tablet daily  2 tablespoons of Gaviscon at bedtime And every 6 hours as needed for excess gaseousness  Report any clinical worsening

## 2016-05-11 NOTE — Telephone Encounter (Signed)
This RN spoke with Erica Young per call concerned due to need to start Ibrance on 05/17/2016 " and the instructions say with breakfast but I do not see Dr Jana Hakim until later in the day "  This RN informed Erica Young to not take the [facility] until she sees Dr Jana Hakim- and then she can start the AM on 05/18/2016 with breakfast.  Arshia verbalized understanding.

## 2016-05-11 NOTE — Progress Notes (Signed)
Pre visit review using our clinic review tool, if applicable. No additional management support is needed unless otherwise documented below in the visit note. 

## 2016-05-14 ENCOUNTER — Other Ambulatory Visit: Payer: Medicare Other

## 2016-05-14 ENCOUNTER — Ambulatory Visit: Payer: Medicare Other

## 2016-05-14 MED FILL — PANTOPRAZOLE SOD DR 20 MG T: 20 | 30 days supply | Qty: 30 | Fill #0

## 2016-05-17 ENCOUNTER — Ambulatory Visit (HOSPITAL_BASED_OUTPATIENT_CLINIC_OR_DEPARTMENT_OTHER): Payer: Medicare Other

## 2016-05-17 ENCOUNTER — Telehealth: Payer: Self-pay | Admitting: Oncology

## 2016-05-17 ENCOUNTER — Other Ambulatory Visit (HOSPITAL_BASED_OUTPATIENT_CLINIC_OR_DEPARTMENT_OTHER): Payer: Medicare Other

## 2016-05-17 ENCOUNTER — Ambulatory Visit: Payer: Medicare Other | Admitting: Oncology

## 2016-05-17 ENCOUNTER — Ambulatory Visit (HOSPITAL_BASED_OUTPATIENT_CLINIC_OR_DEPARTMENT_OTHER): Payer: Medicare Other | Admitting: Oncology

## 2016-05-17 VITALS — BP 131/77 | HR 86 | Temp 97.8°F | Resp 18 | Ht 62.0 in | Wt 129.5 lb

## 2016-05-17 DIAGNOSIS — C7951 Secondary malignant neoplasm of bone: Secondary | ICD-10-CM

## 2016-05-17 DIAGNOSIS — Z5111 Encounter for antineoplastic chemotherapy: Secondary | ICD-10-CM | POA: Diagnosis present

## 2016-05-17 DIAGNOSIS — M8580 Other specified disorders of bone density and structure, unspecified site: Secondary | ICD-10-CM | POA: Diagnosis not present

## 2016-05-17 DIAGNOSIS — G4700 Insomnia, unspecified: Secondary | ICD-10-CM | POA: Diagnosis not present

## 2016-05-17 DIAGNOSIS — Z170 Estrogen receptor positive status [ER+]: Secondary | ICD-10-CM | POA: Diagnosis not present

## 2016-05-17 DIAGNOSIS — R109 Unspecified abdominal pain: Secondary | ICD-10-CM | POA: Diagnosis not present

## 2016-05-17 DIAGNOSIS — C50411 Malignant neoplasm of upper-outer quadrant of right female breast: Secondary | ICD-10-CM

## 2016-05-17 DIAGNOSIS — Z853 Personal history of malignant neoplasm of breast: Secondary | ICD-10-CM

## 2016-05-17 DIAGNOSIS — C50911 Malignant neoplasm of unspecified site of right female breast: Secondary | ICD-10-CM

## 2016-05-17 LAB — COMPREHENSIVE METABOLIC PANEL
ALBUMIN: 3.8 g/dL (ref 3.5–5.0)
ALT: 15 U/L (ref 0–55)
ANION GAP: 10 meq/L (ref 3–11)
AST: 18 U/L (ref 5–34)
Alkaline Phosphatase: 67 U/L (ref 40–150)
BILIRUBIN TOTAL: 0.42 mg/dL (ref 0.20–1.20)
BUN: 12.7 mg/dL (ref 7.0–26.0)
CALCIUM: 9.3 mg/dL (ref 8.4–10.4)
CO2: 27 meq/L (ref 22–29)
CREATININE: 0.8 mg/dL (ref 0.6–1.1)
Chloride: 103 mEq/L (ref 98–109)
EGFR: 68 mL/min/{1.73_m2} — ABNORMAL LOW (ref >90)
Glucose: 110 mg/dl (ref 70–140)
Potassium: 4.3 mEq/L (ref 3.5–5.1)
Sodium: 139 mEq/L (ref 136–145)
TOTAL PROTEIN: 7.2 g/dL (ref 6.4–8.3)

## 2016-05-17 LAB — CBC WITH DIFFERENTIAL/PLATELET
BASO%: 0 % (ref 0.0–2.0)
BASOS ABS: 0 10*3/uL (ref 0.0–0.1)
EOS%: 1.1 % (ref 0.0–7.0)
Eosinophils Absolute: 0.1 10*3/uL (ref 0.0–0.5)
HCT: 41.1 % (ref 34.8–46.6)
HEMOGLOBIN: 13.9 g/dL (ref 11.6–15.9)
LYMPH%: 14.1 % (ref 14.0–49.7)
MCH: 32.4 pg (ref 25.1–34.0)
MCHC: 33.8 g/dL (ref 31.5–36.0)
MCV: 95.8 fL (ref 79.5–101.0)
MONO#: 0.3 10*3/uL (ref 0.1–0.9)
MONO%: 3.8 % (ref 0.0–14.0)
NEUT#: 6.4 10*3/uL (ref 1.5–6.5)
NEUT%: 81 % — ABNORMAL HIGH (ref 38.4–76.8)
Platelets: 140 10*3/uL — ABNORMAL LOW (ref 145–400)
RBC: 4.29 10*6/uL (ref 3.70–5.45)
RDW: 12.8 % (ref 11.2–14.5)
WBC: 7.9 10*3/uL (ref 3.9–10.3)
lymph#: 1.1 10*3/uL (ref 0.9–3.3)

## 2016-05-17 MED ORDER — FULVESTRANT 250 MG/5ML IM SOLN
500.0000 mg | Freq: Once | INTRAMUSCULAR | Status: AC
  Administered 2016-05-17: 500 mg via INTRAMUSCULAR
  Filled 2016-05-17: qty 10

## 2016-05-17 NOTE — Progress Notes (Signed)
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 It was not palpable. She had a right lumpectomy and full sentinel lymph node sampling. She then received adjuvant radiation, to a total of 33 treatments. She received no systemic therapy.  She then did well until December 2014, when she had a fall and complain of pain in her left ribs. Rib films and chest x-ray on 04/13/2013 showed no fractures, but CT of the abdomen and pelvis on the same day found subcarinal and right hilar adenopathy. This was followed up with a chest CT scan 05/05/2013 confirming extensive mediastinal and right hilar lymphadenopathy, with multiple pulmonary nodules, the largest measuring 0.9 cm. PET scan 05/21/2013 showed hypermetabolic adenopathy in the right and left paratracheal areas, the precarinal, subcarinal and right hilar areas, but no involvement of the liver, and the lung nodules were not hypermetabolic (although they were below the level of reliable detection). In addition, at L5 there was a lucency measuring 1.2 cm.  The PET scan also showed a hypermetabolic focus on the thyroid gland,  which was evaluated with neck ultrasound and biopsy 04/22/8249, showing a follicular lesion of undetermined significance (([id number]).  The patient was referred to pulmonary [Dr Melvyn Novas and Dr Julien Nordmann for further evaluation. Repeat PET scan 12/28/2013 showed, in addition to the adenopathy previously noted, now multiple bony metastases. On 01/07/2014 the patient underwent bronchoscopy and this showed ([id number], together with separate cytology] a low-grade mucinous invasive breast cancer, estrogen receptor 100% positive, progesterone receptor 14% positive, with no HER-2 amplification, the signals ratio being 1.30 and the number per cell 1.95.  The patient was started on anastrozole 01/22/2014; monthly denosumab/Xgeva was added 07/30/2014. She appeared to tolerate this well, and her CA-27-29 (127 at baseline) normalized. Most recent CT scans of chest abdomen and pelvis 10/03/2015 showed continuing response. Despite this good news however, the patient decided to go off anastrozole and denosumab/Xgeva as of June 2017. By the time she saw Dr. Earlie Server again in September 2017 her tumor marker had doubled. At that time the patient was referred to the breast clinic for a second opinion regarding further evaluation and treatment.  INTERVAL HISTORY: Erica Young returns today for follow-up of her metastatic estrogen receptor positive breast cancer accompanied by her husband Erica Young. Erica Young was able to obtain the Ibrance/palbociclib, at no cost. The starting dose will be 125 mg daily 21 days on 7 days off. However she has not started the medication because she has MRI of abdominal problems including constipation and she read that constipation is a possible side effects of the medication.  She continues on fulvestrant. She has also read that fulvestrant can cause constipation but she is willing to continue on that medication.  REVIEW OF SYSTEMS: She points to the epigastrium as one of the places where she has abdominal  problems, exactly at the point where the stomach would be squirting acid into her esophagus. Then she has a more diffuse discomfort in the lower abdomen, below the belly button bilaterally. This seems to be relieved by passing gas. She is constipated unless she takes Dulcolax. She does take Dulcolax she has a bowel movement every day, it starts heart but later gets very soft she says. There has been no bleeding. Because of the abdominal pain she is having an headaches she gets up in the middle of the night and can't get back to bed. This is keeping both her and her husband up and is fatiguing both of them. No addition to that she has ringing in her ears sinus problems dental issues feel short of breath, and is afraid of eating anything for fear of making the abdominal problem worse. She is eating oatmeal 2 or 3 times a day right now. She has joint pains here and there which are not more intense or persistent. She feels anxious but denies depression. A detailed review of systems today was otherwise stable  PAST MEDICAL HISTORY: Past Medical History:  Diagnosis Date  . Allergy   . Anxiety   . Bone cancer ([place]) mri 11/11/14   right parietal bone,left cribriform plate metastases  . Cancer [facility]) 2007   right breat ca  , lumpectomy and radiation tx (declined chemo and additional prophylactic meds)  . CEREBROVASCULAR DISEASE 03/03/2010  . Diverticulitis   . DIVERTICULOSIS, COLON 03/03/2010  . Fatty liver 08/02/09   as per U/S done by [facility]  . Foramen ovale    positive bubble study  . Headache(784.0)    she thinks sinus headaches  . History of cerebral artery stenosis    right middle  . HYPERLIPIDEMIA 03/03/2010  . Hypertension   . HYPOTHYROIDISM 03/03/2010   no longer on meds  . Internal hemorrhoid   . Low back pain   . Mastoiditis    noted on brain MRI  . Rib fracture   . Right leg pain   . Shortness of breath   . Stroke [facility]) Oct. 2011   TIA  . Ulcer ([place])     PAST  SURGICAL HISTORY: Past Surgical History:  Procedure Laterality Date  . BREAST LUMPECTOMY     right  . CHOLECYSTECTOMY    . COLONOSCOPY    . SHOULDER SURGERY     right shoulder (dislocation)  . TONSILLECTOMY    . TUBAL LIGATION    . VIDEO BRONCHOSCOPY WITH ENDOBRONCHIAL ULTRASOUND N/A 01/07/2014   Procedure: VIDEO BRONCHOSCOPY WITH ENDOBRONCHIAL ULTRASOUND;  Surgeon: Ivin Poot, MD;  Location: [facility] OR;  Service: Thoracic;  Laterality: N/A;    FAMILY HISTORY Family History  Problem Relation Age of Onset  . Heart disease Sister     cerebral vascular disease also  . Heart disease Brother     cerebral vascular disease also  . Heart disease Brother     cerebral vascular disease  . Heart disease Sister     cebreal vascular disease also  . Heart disease Sister     cebreal vascular diease also  . Heart disease Sister     cerebral vascular diease also  . Stomach cancer Father   . Colon cancer Neg Hx   . Esophageal cancer Neg Hx   . Rectal cancer Neg Hx   The patient's father died from  stomach cancer in his late 27s. The patient's mother died in her 69s from a stroke. The patient has 2 brothers, 4 sisters. One sister had leukemia. There is no history of breast, colon, or ovarian cancer in the family to her knowledge  GYNECOLOGIC HISTORY:  No LMP recorded. Patient is postmenopausal. Menarche age 23, first live birth age 80, the patient is [place] P2. She went through menopause in her late 31s. She took no hormone replacement. She took oral contraceptives for approximately 2 years remotely without complications.  SOCIAL HISTORY:  Seleena is originally from [place]. 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 Her husband Erica Young is a retired substance abuse Social worker. This is a second marriage for both of them. Peta has 2 children from her first marriage, [name], lives in [place], and worked as a Audiological scientist but is now disabled, and Lawrence Santiago, who lives in [place] and works in Engineer, mining. The patient has 6 grandchildren. She attends [employer]Mikki Santee has 3 children from his first marriage. [name] Junior lives in [place] and has his own trucking business. He tells me he is estranged from his 2 daughters Amedeo Gory (a retired Pharmacist, hospital) and Salena Saner (who works in a bank). They living [place].    ADVANCED DIRECTIVES: in place   HEALTH MAINTENANCE: Social History  Substance Use Topics  . Smoking status: Former Smoker    Packs/day: 0.50    Years: 20.00    Types: Cigarettes    Quit date: 05/01/1991  . Smokeless tobacco: Never Used  . Alcohol use No     Colonoscopy:  PAP:  Bone density:   Allergies  Allergen Reactions  . Ciprofloxacin Anaphylaxis    Current Outpatient Prescriptions  Medication Sig Dispense Refill  . ALPRAZolam (XANAX) 0.25 MG tablet Take 1 tablet (0.25 mg total) by mouth 3 (three) times daily as needed. for anxiety 90 tablet 2  . aspirin EC 81 MG tablet Take 81 mg by mouth daily.    . Calcium-Vitamin D (CALTRATE 600 PLUS-VIT D PO) Take 1 tablet by mouth 2 (two) times daily.     Erica Young Kitchen CARTIA XT 120 MG 24 hr capsule TAKE 1 CAPSULE BY MOUTH DAILY 90 capsule 1  . Cyanocobalamin (VITAMIN B 12 PO) Take 100 mcg by mouth daily.    . Fulvestrant (FASLODEX IM) Inject into the muscle.    . Glucosamine 500 MG CAPS Take by mouth.    . meclizine (ANTIVERT) 25 MG tablet TAKE 1 TABLET(25 MG) BY MOUTH EVERY 6 HOURS AS NEEDED FOR DIZZINESS 60 tablet 0  . Melatonin 10 MG TABS Take 1 tablet by mouth daily.    . palbociclib (IBRANCE) 75 MG capsule Take 1 capsule (75 mg total) by mouth daily with breakfast. Take whole with food. 21 capsule 6  . pantoprazole (PROTONIX) 20 MG tablet Take 1 tablet (20 mg total) by mouth daily. 30 tablet 0  . Probiotic Product (PROBIOTIC DAILY PO) Take 1 capsule by mouth once. Takes 1 a day    . simvastatin (ZOCOR) 5 MG tablet TAKE 1 TABLET BY MOUTH DAILY 90 tablet 1   No  current facility-administered medications for this visit.     OBJECTIVE: Older Latin American woman Who appears stated age 76:   05/17/16 1225  BP: 131/77  Pulse: 86  Resp: 18  Temp: 97.8 F (36.6 C)     Body mass index is 23.69 kg/m.    ECOG FS:1 -  Symptomatic but completely ambulatory  Sclerae unicteric, EOMs intact Oropharynx clear and moist No cervical or supraclavicular adenopathy Lungs no rales or rhonchi Heart regular rate and rhythm Abd soft, nontender all quadrants, with no organomegaly, positive bowel sounds MSK no focal spinal tenderness, no upper extremity lymphedema Neuro: nonfocal, well oriented, appropriate affect Breasts: Deferred   LAB RESULTS:  CMP     Component Value Date/Time   NA 139 05/17/2016 1156   K 4.3 05/17/2016 1156   CL 104 01/06/2014 0910   CO2 27 05/17/2016 1156   GLUCOSE 110 05/17/2016 1156   BUN 12.7 05/17/2016 1156   CREATININE 0.8 05/17/2016 1156   CALCIUM 9.3 05/17/2016 1156   PROT 7.2 05/17/2016 1156   ALBUMIN 3.8 05/17/2016 1156   AST 18 05/17/2016 1156   ALT 15 05/17/2016 1156   ALKPHOS 67 05/17/2016 1156   BILITOT 0.42 05/17/2016 1156   GFRNONAA >90 01/06/2014 0910   GFRAA >90 01/06/2014 0910    INo results found for: SPEP, UPEP  Lab Results  Component Value Date   WBC 7.9 05/17/2016   NEUTROABS 6.4 05/17/2016   HGB 13.9 05/17/2016   HCT 41.1 05/17/2016   MCV 95.8 05/17/2016   PLT 140 (L) 05/17/2016      Chemistry      Component Value Date/Time   NA 139 05/17/2016 1156   K 4.3 05/17/2016 1156   CL 104 01/06/2014 0910   CO2 27 05/17/2016 1156   BUN 12.7 05/17/2016 1156   CREATININE 0.8 05/17/2016 1156      Component Value Date/Time   CALCIUM 9.3 05/17/2016 1156   ALKPHOS 67 05/17/2016 1156   AST 18 05/17/2016 1156   ALT 15 05/17/2016 1156   BILITOT 0.42 05/17/2016 1156       Lab Results  Component Value Date   LABCA2 25 08/30/2015    No components found for: VQXIH038  No results for  input(s): INR in the last 168 hours.  Urinalysis    Component Value Date/Time   COLORURINE YELLOW 11/18/2013 1321   APPEARANCEUR CLEAR 11/18/2013 1321   LABSPEC 1.008 11/18/2013 1321   PHURINE 7.5 11/18/2013 1321   GLUCOSEU NEGATIVE 11/18/2013 1321   HGBUR NEGATIVE 11/18/2013 1321   BILIRUBINUR neg 02/16/2015 1318   KETONESUR NEGATIVE 11/18/2013 1321   PROTEINUR neg 02/16/2015 1318   PROTEINUR NEGATIVE 11/18/2013 1321   UROBILINOGEN 0.2 02/16/2015 1318   UROBILINOGEN 0.2 11/18/2013 1321   NITRITE neg 02/16/2015 1318   NITRITE NEGATIVE 11/18/2013 1321   LEUKOCYTESUR Negative 02/16/2015 1318     STUDIES:  No results found.   ELIGIBLE FOR AVAILABLE RESEARCH PROTOCOL: no  ASSESSMENT: 76 y.o. [place] woman  (1) status post right breast upper outer quadrant lumpectomy and axillary lymph node dissection in 2007 for what appears to have been a T1 N0, stage IA invasive ductal carcinoma, treated adjuvantly with radiation (33 sessions)  METASTATIC DISEASE definitively documented Sept 2015 (2) bronchoscopic biopsy 01/07/2014 showed a low-grade mucinous breast cancer, strongly estrogen receptor positive, progesterone receptor positive, HER-2 negative; staging studies confirmed extensive hypermetabolic adenopathy, multiple bone lesions, likely early lung involvement, but no liver lesions.  (3) on Arimidex between September 2015 and June 2017, with evidence of response; discontinued secondary to side effects  (a) bone density 04/10/2016 shows osteopenia with a T score of -2.1  (4) on monthly denosumab/Xgeva between 07/30/2014 and 10/04/2015, discontinued due to patient's concerns regarding osteonecrosis of the jaw  (a) discussed again October 2017, the patient adamantly refusing denosumab  (  5) started fulvestrant 01/26/2016   (a) to add palbociclib at 75 mg daily, 21/7, beginning mid February 2018  PLAN I spent approximately 35 minutes with Katira and her husband today going over  her multiple symptoms, not all of which it seems to me are related either to her cancer or to the treatment we are giving her.  Fatisha has obtained the palbociclib/Ibrance (incidentally at no cost) but has not started it because she read into the possible side effects and noted constipation as well as one of them. She does not want to start the palbociclib until her abdominal symptoms are improved  I think her abdominal symptoms have 2 causes. One of them is reflux. She was just started on Protonix by her primary care physician. She has been on it 4 days so far without much improvement. I suggested she go ahead and double up and take it twice daily for the next few weeks.  The the other problem is likely gas. She did get Gas-X but is not taking it because she is afraid of taking any medications. I suggested that that would be very safe for her. In addition she is taking Dulcolax daily she still has hard bowel movements. I suggested she add a stool softener every morning to the Dulcolax.  I also suggested she stop her calcium, which could worsen the constipation problem. She will start taking vitamin D 1000 units daily.  She is severely limiting her food choices. I suggested a lot of vegetables would be a good thing. She already does that. She has eliminated daily and from her description she probably does have lactose intolerance. However her weight is not significantly different: It was 134 pounds February 2015 and it is 129 pounds now.  She is not sleeping well. This is partly because of the stomach problems and partly because of headaches. Melatonin has not been helpful. I suggested she try Benadryl at night. This is a major issue for them as it doesn't allow her husband to sleep either. If that does not work we will consider mirtazapine  I pointed out that her CA-27-29 appears to be trending down, which is favorable. She will continue the Faslodex today and every 4 weeks and I will continue to see  her every 4 weeks at least for the time being, as she requires a significant amount of support with her treatments. If her abdominal symptoms are better next visit we will try starting palbociclib at that time.   :Chauncey Cruel, MD   05/17/2016 12:37 PM Medical Oncology and Hematology [facility], [place] [address] Tel. [phone]    Fax. [phone]

## 2016-05-17 NOTE — Patient Instructions (Signed)

## 2016-05-17 NOTE — Telephone Encounter (Signed)
Appointments scheduled per 1/18 LOS. Patient given AVS report and calendars with future scheduled appointments.

## 2016-05-18 ENCOUNTER — Other Ambulatory Visit: Payer: Self-pay | Admitting: Internal Medicine

## 2016-05-18 LAB — CANCER ANTIGEN 27.29: CA 27.29: 67.4 U/mL — ABNORMAL HIGH (ref 0.0–38.6)

## 2016-05-21 ENCOUNTER — Encounter: Payer: Self-pay | Admitting: Internal Medicine

## 2016-05-21 ENCOUNTER — Ambulatory Visit (INDEPENDENT_AMBULATORY_CARE_PROVIDER_SITE_OTHER): Payer: Medicare Other | Admitting: Internal Medicine

## 2016-05-21 VITALS — BP 118/62 | HR 75 | Temp 97.9°F | Ht 62.0 in | Wt 129.0 lb

## 2016-05-21 DIAGNOSIS — Z853 Personal history of malignant neoplasm of breast: Secondary | ICD-10-CM

## 2016-05-21 DIAGNOSIS — R590 Localized enlarged lymph nodes: Secondary | ICD-10-CM | POA: Diagnosis not present

## 2016-05-21 DIAGNOSIS — R1010 Upper abdominal pain, unspecified: Secondary | ICD-10-CM | POA: Diagnosis not present

## 2016-05-21 DIAGNOSIS — K5900 Constipation, unspecified: Secondary | ICD-10-CM | POA: Diagnosis not present

## 2016-05-21 DIAGNOSIS — C7951 Secondary malignant neoplasm of bone: Secondary | ICD-10-CM | POA: Diagnosis not present

## 2016-05-21 MED ORDER — OMEPRAZOLE 40 MG PO CPDR: 40.0000 mg | DELAYED_RELEASE_CAPSULE | Freq: Every day | ORAL | 3 refills | Status: DC

## 2016-05-21 MED ORDER — POLYETHYLENE GLYCOL 3350 17 GM/SCOOP PO POWD: 17.0000 g | Freq: Two times a day (BID) | ORAL | 1 refills | Status: DC | PRN

## 2016-05-21 NOTE — Progress Notes (Signed)
Subjective:    Patient ID: Erica Young, female    DOB: 1940-08-28, 76 y.o.   MRN: [id number]  HPI 76 year old patient who has metastatic breast cancer and is followed closely by oncology.  She was seen by oncology last week and has persistent upper abdominal discomfort.  Pain is maximal in the left upper and epigastric area.  She describes some cramping in excess gaseousness.  She has been on PPI therapy as well as antacids with little benefit.  She remains constipated.  Pain occasionally interferes with sleep. Presently is on Protonix, but she states that she did much better on omeprazole a couple of years ago.  No nausea or vomiting She has been using stool softeners but no laxatives  Past Medical History:  Diagnosis Date  . Allergy   . Anxiety   . 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 Cancer [facility]) 2007   right breat ca  , lumpectomy and radiation tx (declined chemo and additional prophylactic meds)  . CEREBROVASCULAR DISEASE 03/03/2010  . Diverticulitis   . DIVERTICULOSIS, COLON 03/03/2010  . Fatty liver 08/02/09   as per U/S done by [facility]  . Foramen ovale    positive bubble study  . Headache(784.0)    she thinks sinus headaches  . History of cerebral artery stenosis    right middle  . HYPERLIPIDEMIA 03/03/2010  . Hypertension   . HYPOTHYROIDISM 03/03/2010   no longer on meds  . Internal hemorrhoid   . Low back pain   . Mastoiditis    noted on brain MRI  . Rib fracture   . Right leg pain   . Shortness of breath   . Stroke [facility]) Oct. 2011   TIA  . Ulcer [facility])      Social History   Social History  . Marital status: Married    Spouse name: N/A  . Number of children: 2  . Years of education: N/A   Occupational History  . Social Worker--Retired    Social History Main Topics  . Smoking status: Former Smoker    Packs/day: 0.50    Years: 20.00    Types: Cigarettes    Quit date: 05/01/1991  .  Smokeless tobacco: Never Used  . Alcohol use No  . Drug use: No  . Sexual activity: Yes   Other Topics Concern  . Not on file   Social History Narrative   Daily caffeine     Past Surgical History:  Procedure Laterality Date  . BREAST LUMPECTOMY     right  . CHOLECYSTECTOMY    . COLONOSCOPY    . SHOULDER SURGERY     right shoulder (dislocation)  . TONSILLECTOMY    . TUBAL LIGATION    . VIDEO BRONCHOSCOPY WITH ENDOBRONCHIAL ULTRASOUND N/A 01/07/2014   Procedure: VIDEO BRONCHOSCOPY WITH ENDOBRONCHIAL ULTRASOUND;  Surgeon: Ivin Poot, MD;  Location: [facility] OR;  Service: Thoracic;  Laterality: N/A;    Family History  Problem Relation Age of Onset  . Heart disease Sister     cerebral vascular disease also  . Heart disease Brother     cerebral vascular disease also  . Heart disease Brother     cerebral vascular disease  . Heart disease Sister     cebreal vascular disease also  . Heart disease Sister     cebreal vascular diease also  . Heart disease Sister     cerebral vascular diease also  .  Stomach cancer Father   . Colon cancer Neg Hx   . Esophageal cancer Neg Hx   . Rectal cancer Neg Hx     Allergies  Allergen Reactions  . Ciprofloxacin Anaphylaxis    Current Outpatient Prescriptions on File Prior to Visit  Medication Sig Dispense Refill  . ALPRAZolam (XANAX) 0.25 MG tablet TAKE 1 TABLET BY MOUTH THREE TIMES DAILY AS NEEDED FOR ANXIETY 90 tablet 0  . aspirin EC 81 MG tablet Take 81 mg by mouth daily.    . Calcium-Vitamin D (CALTRATE 600 PLUS-VIT D PO) Take 1 tablet by mouth 2 (two) times daily.     Marland Kitchen CARTIA XT 120 MG 24 hr capsule TAKE 1 CAPSULE BY MOUTH DAILY 90 capsule 1  . Fulvestrant (FASLODEX IM) Inject into the muscle.    . Glucosamine 500 MG CAPS Take by mouth.    . meclizine (ANTIVERT) 25 MG tablet TAKE 1 TABLET(25 MG) BY MOUTH EVERY 6 HOURS AS NEEDED FOR DIZZINESS 60 tablet 0  . palbociclib (IBRANCE) 75 MG capsule Take 1 capsule (75 mg total) by mouth  daily with breakfast. Take whole with food. 21 capsule 6  . simvastatin (ZOCOR) 5 MG tablet TAKE 1 TABLET BY MOUTH DAILY 90 tablet 1   No current facility-administered medications on file prior to visit.     BP 118/62 (BP Location: Left Arm, Patient Position: Sitting, Cuff Size: Normal)   Pulse 75   Temp 97.9 F (36.6 C) (Oral)   Ht '5\' 2"'$  (1.575 m)   Wt 129 lb (58.5 kg)   SpO2 95%   BMI 23.59 kg/m      Review of Systems  Constitutional: Negative.   HENT: Negative for congestion, dental problem, hearing loss, rhinorrhea, sinus pressure, sore throat and tinnitus.   Eyes: Negative for pain, discharge and visual disturbance.  Respiratory: Negative for cough and shortness of breath.   Cardiovascular: Negative for chest pain, palpitations and leg swelling.  Gastrointestinal: Positive for abdominal pain and constipation. Negative for abdominal distention, blood in stool, diarrhea, nausea and vomiting.  Genitourinary: Negative for difficulty urinating, dysuria, flank pain, frequency, hematuria, pelvic pain, urgency, vaginal bleeding, vaginal discharge and vaginal pain.  Musculoskeletal: Negative for arthralgias, gait problem and joint swelling.  Skin: Negative for rash.  Neurological: Negative for dizziness, syncope, speech difficulty, weakness, numbness and headaches.  Hematological: Negative for adenopathy.  Psychiatric/Behavioral: Positive for sleep disturbance. Negative for agitation, behavioral problems and dysphoric mood. The patient is nervous/anxious.        Objective:   Physical Exam  Constitutional: She is oriented to person, place, and time. She appears well-developed and well-nourished.  HENT:  Head: Normocephalic.  Right Ear: External ear normal.  Left Ear: External ear normal.  Mouth/Throat: Oropharynx is clear and moist.  Eyes: Conjunctivae and EOM are normal. Pupils are equal, round, and reactive to light.  Neck: Normal range of motion. Neck supple. No thyromegaly  present.  Cardiovascular: Normal rate, regular rhythm, normal heart sounds and intact distal pulses.   Pulmonary/Chest: Effort normal and breath sounds normal.  Abdominal: Soft. Bowel sounds are normal. She exhibits no distension and no mass. There is no tenderness. There is no rebound and no guarding.  No organomegaly or masses  Musculoskeletal: Normal range of motion.  Lymphadenopathy:    She has no cervical adenopathy.  Neurological: She is alert and oriented to person, place, and time.  Skin: Skin is warm and dry. No rash noted.  Psychiatric: She has a normal mood  and affect. Her behavior is normal.          Assessment & Plan:   Nonspecific abdominal pain in a patient with metastatic  Breast cancer.  Patient has constipation and appears to be reflux related probably aggravated by constipation issues.  Will intensify treatment for constipation and observe; patient will report any clinical worsening or the onset of nausea, vomiting  [id number],PETER Pilar Plate

## 2016-05-21 NOTE — Patient Instructions (Addendum)
Constipation, Adult Constipation is when a person:  Poops (has a bowel movement) fewer times in a week than normal.  Has a hard time pooping.  Has poop that is dry, hard, or bigger than normal. Follow these instructions at home: Eating and drinking  Eat foods that have a lot of fiber, such as:  Fresh fruits and vegetables.  Whole grains.  Beans.  Eat less of foods that are high in fat, low in fiber, or overly processed, such as:  [place] fries.  Hamburgers.  Cookies.  Candy.  Soda.  Drink enough fluid to keep your pee (urine) clear or pale yellow. General instructions  Exercise regularly or as told by your doctor.  Go to the restroom when you feel like you need to poop. Do not hold it in.  Take over-the-counter and prescription medicines only as told by your doctor. These include any fiber supplements.  Do pelvic floor retraining exercises, such as:  Doing deep breathing while relaxing your lower belly (abdomen).  Relaxing your pelvic floor while pooping.  Watch your condition for any changes.  Keep all follow-up visits as told by your doctor. This is important. Contact a doctor if:  You have pain that gets worse.  You have a fever.  You have not pooped for 4 days.  You throw up (vomit).  You are not hungry.  You lose weight.  You are bleeding from the anus.  You have thin, pencil-like poop (stool). Get help right away if:  You have a fever, and your symptoms suddenly get worse.  You leak poop or have blood in your poop.  Your belly feels hard or bigger than normal (is bloated).  You have very bad belly pain.  You feel dizzy or you faint. This information is not intended to replace advice given to you by your health care provider. Make sure you discuss any questions you have with your health care provider. Document Released: 10/03/2007 Document Revised: 11/04/2015 Document Reviewed: 10/05/2015 Elsevier Interactive Patient Education  2017  [place] stool softener twice daily Try to increase your activity level Increased your level of fluid intake  Use MiraLAX  once or twice daily as needed to control, constipation Food Choices for Gastroesophageal Reflux Disease, Adult When you have gastroesophageal reflux disease (GERD), the foods you eat and your eating habits are very important. Choosing the right foods can help ease your discomfort. What guidelines do I need to follow?  Choose fruits, vegetables, whole grains, and low-fat dairy products.  Choose low-fat meat, fish, and poultry.  Limit fats such as oils, salad dressings, butter, nuts, and avocado.  Keep a food diary. This helps you identify foods that cause symptoms.  Avoid foods that cause symptoms. These may be different for everyone.  Eat small meals often instead of 3 large meals a day.  Eat your meals slowly, in a place where you are relaxed.  Limit fried foods.  Cook foods using methods other than frying.  Avoid drinking alcohol.  Avoid drinking large amounts of liquids with your meals.  Avoid bending over or lying down until 2-3 hours after eating. What foods are not recommended? These are some foods and drinks that may make your symptoms worse: Vegetables  Tomatoes. Tomato juice. Tomato and spaghetti sauce. Chili peppers. Onion and garlic. Horseradish. Fruits  Oranges, grapefruit, and lemon (fruit and juice). Meats  High-fat meats, fish, and poultry. This includes hot dogs, ribs, ham, sausage, salami, and bacon. Dairy  Whole milk and  chocolate milk. Sour cream. Cream. Butter. Ice cream. Cream cheese. Drinks  Coffee and tea. Bubbly (carbonated) drinks or energy drinks. Condiments  Hot sauce. Barbecue sauce. Sweets/Desserts  Chocolate and cocoa. Donuts. Peppermint and spearmint. Fats and Oils  High-fat foods. This includes [place] fries and potato chips. Other  Vinegar. Strong spices. This includes black pepper, white pepper,  red pepper, cayenne, curry powder, cloves, ginger, and chili powder. The items listed above may not be a complete list of foods and drinks to avoid. Contact your dietitian for more information.  This information is not intended to replace advice given to you by your health care provider. Make sure you discuss any questions you have with your health care provider. Document Released: 10/16/2011 Document Revised: 09/22/2015 Document Reviewed: 02/18/2013 Elsevier Interactive Patient Education  2017 [employer].

## 2016-05-21 NOTE — Progress Notes (Signed)
Pre visit review using our clinic review tool, if applicable. No additional management support is needed unless otherwise documented below in the visit note. 

## 2016-06-14 ENCOUNTER — Other Ambulatory Visit: Payer: Medicare Other

## 2016-06-14 ENCOUNTER — Ambulatory Visit: Payer: Medicare Other

## 2016-06-14 ENCOUNTER — Other Ambulatory Visit (HOSPITAL_BASED_OUTPATIENT_CLINIC_OR_DEPARTMENT_OTHER): Payer: Medicare Other

## 2016-06-14 ENCOUNTER — Ambulatory Visit (HOSPITAL_BASED_OUTPATIENT_CLINIC_OR_DEPARTMENT_OTHER): Payer: Medicare Other

## 2016-06-14 ENCOUNTER — Ambulatory Visit (HOSPITAL_BASED_OUTPATIENT_CLINIC_OR_DEPARTMENT_OTHER): Payer: Medicare Other | Admitting: Oncology

## 2016-06-14 VITALS — BP 117/99 | HR 77 | Temp 97.5°F | Resp 18 | Ht 62.0 in | Wt 126.1 lb

## 2016-06-14 DIAGNOSIS — C7951 Secondary malignant neoplasm of bone: Secondary | ICD-10-CM

## 2016-06-14 DIAGNOSIS — C50411 Malignant neoplasm of upper-outer quadrant of right female breast: Secondary | ICD-10-CM

## 2016-06-14 DIAGNOSIS — Z170 Estrogen receptor positive status [ER+]: Secondary | ICD-10-CM

## 2016-06-14 DIAGNOSIS — C50911 Malignant neoplasm of unspecified site of right female breast: Secondary | ICD-10-CM

## 2016-06-14 DIAGNOSIS — M8580 Other specified disorders of bone density and structure, unspecified site: Secondary | ICD-10-CM

## 2016-06-14 DIAGNOSIS — Z853 Personal history of malignant neoplasm of breast: Secondary | ICD-10-CM

## 2016-06-14 LAB — CBC WITH DIFFERENTIAL/PLATELET
BASO%: 0.4 % (ref 0.0–2.0)
Basophils Absolute: 0 10*3/uL (ref 0.0–0.1)
EOS%: 5.9 % (ref 0.0–7.0)
Eosinophils Absolute: 0.3 10*3/uL (ref 0.0–0.5)
HCT: 40.1 % (ref 34.8–46.6)
HGB: 13.5 g/dL (ref 11.6–15.9)
LYMPH%: 38.6 % (ref 14.0–49.7)
MCH: 32 pg (ref 25.1–34.0)
MCHC: 33.7 g/dL (ref 31.5–36.0)
MCV: 95 fL (ref 79.5–101.0)
MONO#: 0.3 10*3/uL (ref 0.1–0.9)
MONO%: 5.7 % (ref 0.0–14.0)
NEUT%: 49.4 % (ref 38.4–76.8)
NEUTROS ABS: 2.3 10*3/uL (ref 1.5–6.5)
Platelets: 188 10*3/uL (ref 145–400)
RBC: 4.22 10*6/uL (ref 3.70–5.45)
RDW: 12.5 % (ref 11.2–14.5)
WBC: 4.7 10*3/uL (ref 3.9–10.3)
lymph#: 1.8 10*3/uL (ref 0.9–3.3)

## 2016-06-14 LAB — COMPREHENSIVE METABOLIC PANEL
ALBUMIN: 3.7 g/dL (ref 3.5–5.0)
ALK PHOS: 70 U/L (ref 40–150)
ALT: 15 U/L (ref 0–55)
AST: 18 U/L (ref 5–34)
Anion Gap: 9 mEq/L (ref 3–11)
BILIRUBIN TOTAL: 0.29 mg/dL (ref 0.20–1.20)
BUN: 13.1 mg/dL (ref 7.0–26.0)
CALCIUM: 9.8 mg/dL (ref 8.4–10.4)
CO2: 31 mEq/L — ABNORMAL HIGH (ref 22–29)
CREATININE: 0.8 mg/dL (ref 0.6–1.1)
Chloride: 101 mEq/L (ref 98–109)
EGFR: 74 mL/min/{1.73_m2} — ABNORMAL LOW (ref >90)
Glucose: 95 mg/dl (ref 70–140)
POTASSIUM: 4.6 meq/L (ref 3.5–5.1)
Sodium: 140 mEq/L (ref 136–145)
TOTAL PROTEIN: 7.1 g/dL (ref 6.4–8.3)

## 2016-06-14 MED ORDER — FULVESTRANT 250 MG/5ML IM SOLN
500.0000 mg | Freq: Once | INTRAMUSCULAR | Status: AC
  Administered 2016-06-14: 500 mg via INTRAMUSCULAR
  Filled 2016-06-14: qty 10

## 2016-06-14 NOTE — Patient Instructions (Signed)

## 2016-06-14 NOTE — Progress Notes (Signed)
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 Marlon continues on monthly fulvestrant, which she tolerates well. She has no side effects from that medication that she is aware of and she is very stoical about the discomfort from the actual delivery of the drug intramuscularly.  She has obtained the palbociclib and tells me she is "ready to take it", but she is extremely anxious about it and having difficulty falling asleep because of worries about side effects.   REVIEW OF SYSTEMS: Since the last visit here she started omeprazole and that made an enormous difference to her reflux symptoms. After taking it for a couple which she stopped the medication but she feels she can go back to it if necessary. She is currently using flaxseed prune juice and will try stool softeners as well as more fiber in her diet. She started doing tai chi through our program here and is enjoying. All this is very favorable. A detailed review of systems today was otherwise stable.  BREAST CANCER HISTORY: From the earlier summary note  [name] tells me in 2007 while living in [place] she was found to have a very small cancer in the upper-outer quadrant of the right breast, about the  size of the P, noted on mammography. It was not palpable. She had a right lumpectomy and full sentinel lymph node sampling. She then received adjuvant radiation, to a total of 33 treatments. She received no systemic therapy.  She then did well until December 2014, when she had a fall and complain of pain in her left ribs. Rib films and chest x-ray on 04/13/2013 showed no fractures, but CT of the abdomen and pelvis on the same day found subcarinal and right hilar adenopathy. This was followed up with a chest CT scan 05/05/2013 confirming extensive mediastinal and right hilar lymphadenopathy, with multiple pulmonary nodules, the largest measuring 0.9 cm. PET scan 05/21/2013 showed hypermetabolic adenopathy in the right and left paratracheal areas, the precarinal, subcarinal and right hilar areas, but no involvement of the liver, and the lung nodules were not hypermetabolic (although they were below the level of reliable detection). In addition, at L5 there was a lucency measuring 1.2 cm.  The PET scan also showed a hypermetabolic focus on the thyroid gland, which was evaluated with neck ultrasound and biopsy 38/93/7342, showing a follicular lesion of undetermined significance (([id number]).  The patient was referred to pulmonary [Dr Melvyn Novas and Dr Julien Nordmann for further evaluation. Repeat PET scan 12/28/2013 showed, in addition to the adenopathy previously noted, now multiple bony metastases. On 01/07/2014 the patient underwent bronchoscopy and this showed ([id number], together with separate cytology] a low-grade mucinous invasive breast cancer, estrogen receptor 100% positive, progesterone receptor 14% positive, with no HER-2 amplification, the signals ratio being 1.30 and the number per cell 1.95.  The patient was started on anastrozole 01/22/2014; monthly denosumab/Xgeva  was added 07/30/2014. She appeared to tolerate this well, and her CA-27-29 (127 at baseline) normalized. Most recent CT scans of chest abdomen  and pelvis 10/03/2015 showed continuing response. Despite this good news however, the patient decided to go off anastrozole and denosumab/Xgeva as of June 2017. By the time she saw Dr. Earlie Server again in September 2017 her tumor marker had doubled. At that time the patient was referred to the breast clinic for a second opinion regarding further evaluation and treatment.   PAST MEDICAL HISTORY: Past Medical History:  Diagnosis Date  . Allergy   . Anxiety   . Bone cancer ([place]) mri 11/11/14   right parietal bone,left cribriform plate metastases  . Cancer [facility]) 2007   right breat ca  , lumpectomy and radiation tx (declined chemo and additional prophylactic meds)  . CEREBROVASCULAR DISEASE 03/03/2010  . Diverticulitis   . DIVERTICULOSIS, COLON 03/03/2010  . Fatty liver 08/02/09   as per U/S done by [facility]  . Foramen ovale    positive bubble study  . Headache(784.0)    she thinks sinus headaches  . History of cerebral artery stenosis    right middle  . HYPERLIPIDEMIA 03/03/2010  . Hypertension   . HYPOTHYROIDISM 03/03/2010   no longer on meds  . Internal hemorrhoid   . Low back pain   . Mastoiditis    noted on brain MRI  . Rib fracture   . Right leg pain   . Shortness of breath   . Stroke [facility]) Oct. 2011   TIA  . Ulcer ([place])     PAST SURGICAL HISTORY: Past Surgical History:  Procedure Laterality Date  . BREAST LUMPECTOMY     right  . CHOLECYSTECTOMY    . COLONOSCOPY    . SHOULDER SURGERY     right shoulder (dislocation)  . TONSILLECTOMY    . TUBAL LIGATION    . VIDEO BRONCHOSCOPY WITH ENDOBRONCHIAL ULTRASOUND N/A 01/07/2014   Procedure: VIDEO BRONCHOSCOPY WITH ENDOBRONCHIAL ULTRASOUND;  Surgeon: Ivin Poot, MD;  Location: [facility] OR;  Service: Thoracic;  Laterality: N/A;    FAMILY HISTORY Family History  Problem Relation Age of Onset  . Heart disease Sister     cerebral vascular disease also  . Heart disease Brother     cerebral vascular disease also  .  Heart disease Brother     cerebral vascular disease  . Heart disease Sister     cebreal vascular disease also  . Heart disease Sister     cebreal vascular diease also  . Heart disease Sister     cerebral vascular diease also  . Stomach cancer Father   . Colon cancer Neg Hx   . Esophageal cancer Neg Hx   . Rectal cancer Neg Hx   The patient's father died from stomach cancer in his late 51s. The patient's mother died in her 40s from a stroke. The patient has 2 brothers, 4 sisters. One sister had leukemia. There is no history of breast, colon, or ovarian cancer in the family to her knowledge  GYNECOLOGIC HISTORY:  No LMP recorded. Patient is postmenopausal. Menarche age 69, first live birth age 55, the patient is [place] P2. She went through menopause in her late 73s. She took no hormone replacement. She took oral contraceptives for approximately 2 years remotely without complications.  SOCIAL HISTORY:  Jeannemarie is originally from [place]. 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 Her husband Mortimer Fries is  a retired substance abuse Social worker. This is a second marriage for both of them. Esta has 2 children from her first marriage, [name], lives in [place], and worked as a Audiological scientist but is now disabled, and Lawrence Santiago, who lives in [place] and works in Engineer, mining. The patient has 6 grandchildren. She attends [employer].--Mikki Santee has 3 children from his first marriage. [name] Junior lives in [place] and has his own trucking business. He tells me he is estranged from his 2 daughters Amedeo Gory (a retired Pharmacist, hospital) and Salena Saner (who works in a bank). They living [place].    ADVANCED DIRECTIVES: in place   HEALTH MAINTENANCE: Social History  Substance Use Topics  . Smoking status: Former Smoker    Packs/day: 0.50    Years: 20.00    Types: Cigarettes    Quit date: 05/01/1991  . Smokeless tobacco: Never Used  . Alcohol use No      Colonoscopy:  PAP:  Bone density:   Allergies  Allergen Reactions  . Ciprofloxacin Anaphylaxis    Current Outpatient Prescriptions  Medication Sig Dispense Refill  . ALPRAZolam (XANAX) 0.25 MG tablet TAKE 1 TABLET BY MOUTH THREE TIMES DAILY AS NEEDED FOR ANXIETY 90 tablet 0  . aspirin EC 81 MG tablet Take 81 mg by mouth daily.    Marland Kitchen CARTIA XT 120 MG 24 hr capsule TAKE 1 CAPSULE BY MOUTH DAILY 90 capsule 1  . Cholecalciferol (VITAMIN D PO) Take 1,000 Units by mouth 2 (two) times daily.    Marland Kitchen docusate sodium (COLACE) 100 MG capsule Take 100 mg by mouth daily as needed for mild constipation.    . Flaxseed, Linseed, (FLAX SEEDS PO) Take by mouth daily.    . Fulvestrant (FASLODEX IM) Inject into the muscle.    . Glucosamine 500 MG CAPS Take by mouth.    . Multiple Vitamins-Minerals (MULTIVITAMIN PO) Take by mouth daily.    . Probiotic Product (PROBIOTIC DAILY PO) Take by mouth daily.    . simvastatin (ZOCOR) 5 MG tablet TAKE 1 TABLET BY MOUTH DAILY 90 tablet 1  . meclizine (ANTIVERT) 25 MG tablet TAKE 1 TABLET(25 MG) BY MOUTH EVERY 6 HOURS AS NEEDED FOR DIZZINESS (Patient not taking: Reported on 06/14/2016) 60 tablet 0  . palbociclib (IBRANCE) 75 MG capsule Take 1 capsule (75 mg total) by mouth daily with breakfast. Take whole with food. 21 capsule 6   No current facility-administered medications for this visit.     OBJECTIVE: Older Latin American woman In no acute distress Vitals:   06/14/16 1233  BP: (!) 117/99  Pulse: 77  Resp: 18  Temp: 97.5 F (36.4 C)     Body mass index is 23.06 kg/m.    ECOG FS:1 - Symptomatic but completely ambulatory  Sclerae unicteric, pupils round and equal Oropharynx clear and moist-- no thrush or other lesions No cervical or supraclavicular adenopathy Lungs no rales or rhonchi Heart regular rate and rhythm Abd soft, nontender, positive bowel sounds MSK no focal spinal tenderness, no upper extremity lymphedema Neuro: nonfocal, well oriented,  appropriate affect Breasts: The right breast is status post remote lumpectomy with no evidence of local recurrence. The right axilla is benign. Left breast is unremarkable  LAB RESULTS:  CMP     Component Value Date/Time   NA 140 06/14/2016 1159   K 4.6 06/14/2016 1159   CL 104 01/06/2014 0910   CO2 31 (H) 06/14/2016 1159   GLUCOSE 95 06/14/2016 1159   BUN  13.1 06/14/2016 1159   CREATININE 0.8 06/14/2016 1159   CALCIUM 9.8 06/14/2016 1159   PROT 7.1 06/14/2016 1159   ALBUMIN 3.7 06/14/2016 1159   AST 18 06/14/2016 1159   ALT 15 06/14/2016 1159   ALKPHOS 70 06/14/2016 1159   BILITOT 0.29 06/14/2016 1159   GFRNONAA >90 01/06/2014 0910   GFRAA >90 01/06/2014 0910    INo results found for: SPEP, UPEP  Lab Results  Component Value Date   WBC 4.7 06/14/2016   NEUTROABS 2.3 06/14/2016   HGB 13.5 06/14/2016   HCT 40.1 06/14/2016   MCV 95.0 06/14/2016   PLT 188 06/14/2016      Chemistry      Component Value Date/Time   NA 140 06/14/2016 1159   K 4.6 06/14/2016 1159   CL 104 01/06/2014 0910   CO2 31 (H) 06/14/2016 1159   BUN 13.1 06/14/2016 1159   CREATININE 0.8 06/14/2016 1159      Component Value Date/Time   CALCIUM 9.8 06/14/2016 1159   ALKPHOS 70 06/14/2016 1159   AST 18 06/14/2016 1159   ALT 15 06/14/2016 1159   BILITOT 0.29 06/14/2016 1159       Lab Results  Component Value Date   LABCA2 25 08/30/2015    No components found for: [id number]  No results for input(s): INR in the last 168 hours.  Urinalysis    Component Value Date/Time   COLORURINE YELLOW 11/18/2013 1321   APPEARANCEUR CLEAR 11/18/2013 1321   LABSPEC 1.008 11/18/2013 1321   PHURINE 7.5 11/18/2013 1321   GLUCOSEU NEGATIVE 11/18/2013 1321   HGBUR NEGATIVE 11/18/2013 1321   BILIRUBINUR neg 02/16/2015 1318   KETONESUR NEGATIVE 11/18/2013 1321   PROTEINUR neg 02/16/2015 1318   PROTEINUR NEGATIVE 11/18/2013 1321   UROBILINOGEN 0.2 02/16/2015 1318   UROBILINOGEN 0.2 11/18/2013 1321    NITRITE neg 02/16/2015 1318   NITRITE NEGATIVE 11/18/2013 1321   LEUKOCYTESUR Negative 02/16/2015 1318     STUDIES:  No results found.   ELIGIBLE FOR AVAILABLE RESEARCH PROTOCOL: no  ASSESSMENT: 76 y.o. [place] woman  (1) status post right breast upper outer quadrant lumpectomy and axillary lymph node dissection in 2007 for what appears to have been a T1 N0, stage IA invasive ductal carcinoma, treated adjuvantly with radiation (33 sessions)  METASTATIC DISEASE definitively documented Sept 2015 (2) bronchoscopic biopsy 01/07/2014 showed a low-grade mucinous breast cancer, strongly estrogen receptor positive, progesterone receptor positive, HER-2 negative; staging studies confirmed extensive hypermetabolic adenopathy, multiple bone lesions, likely early lung involvement, but no liver lesions.  (3) on Arimidex between September 2015 and June 2017, with evidence of response; discontinued secondary to side effects  (a) bone density 04/10/2016 shows osteopenia with a T score of -2.1  (4) on monthly denosumab/Xgeva between 07/30/2014 and 10/04/2015, discontinued due to patient's concerns regarding osteonecrosis of the jaw  (a) discussed again October 2017, the patient adamantly refusing denosumab  (5) started fulvestrant 01/26/2016   (a) to add palbociclib at 75 mg daily, 21/7, beginning mid February 2018  PLAN I spent approximately 30 minutes with Erica Young with most of that time spent discussing her complex problems. She is very anxious about starting the palbociclib area she is willing to do it but I think if we tried to do daily treatments we are going to get into pretty rapid side effects and have to stop.  We came up with a better plan. She is can it take 1 tablet February 19. If nothing happens over the next week  she will take another one on February 26 and then on March 1. If she does well with that then she will take one on Monday Wednesday and Friday beginning March 5.  I  anticipate that she will tolerate this much better and it will make her less anxious regarding the possible side effects of this medication.  On March 12 she scheduled for restaging studies which will serve as our new baseline. She will see me March 15 with her next fulvestrant dose. If she did as well with the palbociclib as I expect we will try to do once a day treatment initially for 2 weeks on and 2 weeks off and then later 3 weeks on and one-week off which of course is the normal schedule  She has done a good job of fixing the constipation problem and is currently more regular with less abdominal discomfort.  I encouraged her to call us with any problems that may develop before the next visit.    :Chauncey Cruel, MD   06/14/2016 12:56 PM Medical Oncology and Hematology [facility] [place], [place] [address] Tel. [phone]    Fax. [phone]

## 2016-06-15 LAB — CANCER ANTIGEN 27.29: CAN 27.29: 63.3 U/mL — AB (ref 0.0–38.6)

## 2016-07-09 ENCOUNTER — Other Ambulatory Visit (HOSPITAL_COMMUNITY): Payer: Medicare Other

## 2016-07-09 ENCOUNTER — Encounter (HOSPITAL_COMMUNITY): Payer: Self-pay

## 2016-07-09 ENCOUNTER — Encounter (HOSPITAL_COMMUNITY)
Admission: RE | Admit: 2016-07-09 | Discharge: 2016-07-09 | Disposition: A | Discharge status: Home / Self Care | Payer: Medicare Other | Source: Ambulatory Visit | Attending: Oncology | Admitting: Oncology

## 2016-07-09 ENCOUNTER — Ambulatory Visit (HOSPITAL_COMMUNITY)
Admission: RE | Admit: 2016-07-09 | Discharge: 2016-07-09 | Disposition: A | Discharge status: Home / Self Care | Payer: Medicare Other | Source: Ambulatory Visit | Attending: Oncology | Admitting: Oncology

## 2016-07-09 ENCOUNTER — Encounter (HOSPITAL_COMMUNITY): Payer: Medicare Other

## 2016-07-09 DIAGNOSIS — C50411 Malignant neoplasm of upper-outer quadrant of right female breast: Secondary | ICD-10-CM

## 2016-07-09 DIAGNOSIS — C50911 Malignant neoplasm of unspecified site of right female breast: Secondary | ICD-10-CM | POA: Diagnosis not present

## 2016-07-09 DIAGNOSIS — R918 Other nonspecific abnormal finding of lung field: Secondary | ICD-10-CM | POA: Diagnosis not present

## 2016-07-09 DIAGNOSIS — C7951 Secondary malignant neoplasm of bone: Secondary | ICD-10-CM | POA: Diagnosis not present

## 2016-07-09 DIAGNOSIS — Z170 Estrogen receptor positive status [ER+]: Secondary | ICD-10-CM | POA: Insufficient documentation

## 2016-07-09 DIAGNOSIS — C50919 Malignant neoplasm of unspecified site of unspecified female breast: Secondary | ICD-10-CM | POA: Diagnosis not present

## 2016-07-09 DIAGNOSIS — I700 Atherosclerosis of aorta: Secondary | ICD-10-CM | POA: Diagnosis not present

## 2016-07-09 DIAGNOSIS — R0602 Shortness of breath: Secondary | ICD-10-CM | POA: Diagnosis not present

## 2016-07-09 MED ORDER — IOPAMIDOL (ISOVUE-300) INJECTION 61%
75.0000 mL | Freq: Once | INTRAVENOUS | Status: AC | PRN
  Administered 2016-07-09: 75 mL via INTRAVENOUS

## 2016-07-09 MED ORDER — IOPAMIDOL (ISOVUE-300) INJECTION 61%
INTRAVENOUS | Status: AC
  Filled 2016-07-09: qty 75

## 2016-07-09 MED ORDER — TECHNETIUM TC 99M MEDRONATE IV KIT
21.0000 | PACK | Freq: Once | INTRAVENOUS | Status: AC | PRN
  Administered 2016-07-09: 21 via INTRAVENOUS

## 2016-07-12 ENCOUNTER — Ambulatory Visit (HOSPITAL_BASED_OUTPATIENT_CLINIC_OR_DEPARTMENT_OTHER): Payer: Medicare Other | Admitting: Oncology

## 2016-07-12 ENCOUNTER — Telehealth: Payer: Self-pay | Admitting: Oncology

## 2016-07-12 ENCOUNTER — Other Ambulatory Visit (HOSPITAL_BASED_OUTPATIENT_CLINIC_OR_DEPARTMENT_OTHER): Payer: Medicare Other

## 2016-07-12 ENCOUNTER — Other Ambulatory Visit: Payer: Self-pay | Admitting: Internal Medicine

## 2016-07-12 ENCOUNTER — Ambulatory Visit (HOSPITAL_BASED_OUTPATIENT_CLINIC_OR_DEPARTMENT_OTHER): Payer: Medicare Other

## 2016-07-12 VITALS — BP 135/70 | HR 73 | Temp 97.6°F | Resp 18 | Ht 62.0 in | Wt 123.2 lb

## 2016-07-12 DIAGNOSIS — C50411 Malignant neoplasm of upper-outer quadrant of right female breast: Secondary | ICD-10-CM

## 2016-07-12 DIAGNOSIS — C7951 Secondary malignant neoplasm of bone: Secondary | ICD-10-CM

## 2016-07-12 DIAGNOSIS — Z853 Personal history of malignant neoplasm of breast: Secondary | ICD-10-CM

## 2016-07-12 DIAGNOSIS — K219 Gastro-esophageal reflux disease without esophagitis: Secondary | ICD-10-CM

## 2016-07-12 DIAGNOSIS — C50911 Malignant neoplasm of unspecified site of right female breast: Secondary | ICD-10-CM

## 2016-07-12 DIAGNOSIS — Z170 Estrogen receptor positive status [ER+]: Principal | ICD-10-CM

## 2016-07-12 DIAGNOSIS — Z5111 Encounter for antineoplastic chemotherapy: Secondary | ICD-10-CM | POA: Diagnosis present

## 2016-07-12 DIAGNOSIS — K5900 Constipation, unspecified: Secondary | ICD-10-CM

## 2016-07-12 LAB — CBC WITH DIFFERENTIAL/PLATELET
BASO%: 1 % (ref 0.0–2.0)
Basophils Absolute: 0 10*3/uL (ref 0.0–0.1)
EOS ABS: 0.1 10*3/uL (ref 0.0–0.5)
EOS%: 4.2 % (ref 0.0–7.0)
HEMATOCRIT: 37.6 % (ref 34.8–46.6)
HGB: 12.7 g/dL (ref 11.6–15.9)
LYMPH#: 1.3 10*3/uL (ref 0.9–3.3)
LYMPH%: 45.7 % (ref 14.0–49.7)
MCH: 32.6 pg (ref 25.1–34.0)
MCHC: 33.8 g/dL (ref 31.5–36.0)
MCV: 96.7 fL (ref 79.5–101.0)
MONO#: 0.2 10*3/uL (ref 0.1–0.9)
MONO%: 5.2 % (ref 0.0–14.0)
NEUT%: 43.9 % (ref 38.4–76.8)
NEUTROS ABS: 1.3 10*3/uL — AB (ref 1.5–6.5)
PLATELETS: 128 10*3/uL — AB (ref 145–400)
RBC: 3.89 10*6/uL (ref 3.70–5.45)
RDW: 13.7 % (ref 11.2–14.5)
WBC: 2.9 10*3/uL — AB (ref 3.9–10.3)

## 2016-07-12 LAB — COMPREHENSIVE METABOLIC PANEL
ALBUMIN: 3.6 g/dL (ref 3.5–5.0)
ALK PHOS: 56 U/L (ref 40–150)
ALT: 18 U/L (ref 0–55)
AST: 18 U/L (ref 5–34)
Anion Gap: 7 mEq/L (ref 3–11)
BUN: 13.1 mg/dL (ref 7.0–26.0)
CALCIUM: 9.1 mg/dL (ref 8.4–10.4)
CHLORIDE: 107 meq/L (ref 98–109)
CO2: 27 mEq/L (ref 22–29)
Creatinine: 0.8 mg/dL (ref 0.6–1.1)
EGFR: 74 mL/min/{1.73_m2} — AB (ref >90)
Glucose: 95 mg/dl (ref 70–140)
POTASSIUM: 4.4 meq/L (ref 3.5–5.1)
SODIUM: 141 meq/L (ref 136–145)
Total Bilirubin: 0.45 mg/dL (ref 0.20–1.20)
Total Protein: 6.5 g/dL (ref 6.4–8.3)

## 2016-07-12 MED ORDER — PALBOCICLIB 75 MG PO CAPS: 75.0000 mg | ORAL_CAPSULE | Freq: Every day | ORAL | 6 refills | Status: DC

## 2016-07-12 MED ORDER — FULVESTRANT 250 MG/5ML IM SOLN
500.0000 mg | Freq: Once | INTRAMUSCULAR | Status: AC
  Administered 2016-07-12: 500 mg via INTRAMUSCULAR
  Filled 2016-07-12: qty 10

## 2016-07-12 MED ORDER — OMEPRAZOLE 20 MG PO CPDR: 20.0000 mg | DELAYED_RELEASE_CAPSULE | Freq: Every day | ORAL | 4 refills | Status: DC

## 2016-07-12 MED FILL — IBRANCE 75 MG CAPSULE: 75 | 28 days supply | Qty: 21 | Fill #0

## 2016-07-12 NOTE — Progress Notes (Signed)
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 She continues on fulvestrant monthly, which she tolerates well. In addition, at the last visit we added palbociclib/Ibrance at 75 mg.   Because of the patient's concerns we started the medication very gradually. She received one tablet the first week, 2 tablets the next week, and then Monday Wednesday and Friday of the third week. She had absolutely no problem with this schedule and is ready to start taking the medication daily if that is appropriate.  However the medication has dropped her white cell count and I don't think she is going to be able to tolerated the daily treatments. This is discussed further below  Since her last visit here we also restage her with a CT scan of the chest and a bone scan and this shows completely stable disease, which is excellent in this setting.  REVIEW OF SYSTEMS: Angalena tells me she has three problems. There are constipation, reflux, and gas. She is already taking Gas-X and similar medications. She had stopped taking her omeprazole because she said the problem had gone away with it. She is trying to work on the constipation in a variety of ways which are not so far being very successful. Her primary care physician is  also involved in this and suggested that she stop acidic foods like tomatoes. However this has caused her to avoid so many foods that she is losing weight. She is anxious about that. Aside from these issues a detailed review of systems today was stable  BREAST CANCER HISTORY: From the earlier summary note  Charitie tells me in 2007 while living in [place] she was found to have a very small cancer in the upper-outer quadrant of the right breast, about the size of the P, noted on mammography. It was not palpable. She had a right lumpectomy and full sentinel lymph node sampling. She then received adjuvant radiation, to a total of 33 treatments. She received no systemic therapy.  She then did well until December 2014, when she had a fall and complain of pain in her left ribs. Rib films and chest x-ray on 04/13/2013 showed no fractures, but CT of the abdomen and pelvis on the same day found subcarinal and right hilar adenopathy. This was followed up with a chest CT scan 05/05/2013 confirming extensive mediastinal and right hilar lymphadenopathy, with multiple pulmonary nodules, the largest measuring 0.9 cm. PET scan 05/21/2013 showed hypermetabolic adenopathy in the right and left paratracheal areas, the precarinal, subcarinal and right hilar areas, but no involvement of the liver, and the lung nodules were not hypermetabolic (although they were below the level of reliable detection). In addition, at L5 there was a lucency measuring 1.2 cm.  The PET scan also showed a hypermetabolic focus on the thyroid gland, which was evaluated with neck ultrasound and biopsy 06/02/2013, showing a follicular lesion of undetermined significance (([id number]).    The patient was referred to pulmonary [Dr Wert and Dr Mohamed] for further evaluation. Repeat PET scan 12/28/2013 showed, in addition to the adenopathy previously noted, now multiple bony metastases. On 01/07/2014 the patient underwent bronchoscopy and this showed (SZA  [id number], together with separate cytology] a low-grade mucinous invasive breast cancer, estrogen receptor 100% positive, progesterone receptor 14% positive, with no HER-2 amplification, the signals ratio being 1.30 and the number per cell 1.95.  The patient was started on anastrozole 01/22/2014; monthly denosumab/Xgeva was added 07/30/2014. She appeared to tolerate this well, and her CA-27-29 (127 at baseline) normalized. Most recent CT scans of chest abdomen and pelvis 10/03/2015 showed continuing response. Despite this good news however, the patient decided to go off anastrozole and denosumab/Xgeva as of June 2017. By the time she saw Dr. Mohammed again in September 2017 her tumor marker had doubled. At that time the patient was referred to the breast clinic for a second opinion regarding further evaluation and treatment.   PAST MEDICAL HISTORY: Past Medical History:  Diagnosis Date  . Allergy   . Anxiety   . 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 Cancer (HCC) 2007   right breat ca  , lumpectomy and radiation tx (declined chemo and additional prophylactic meds)  . CEREBROVASCULAR DISEASE 03/03/2010  . Diverticulitis   . DIVERTICULOSIS, COLON 03/03/2010  . Fatty liver 08/02/09   as per U/S done by [facility]  . Foramen ovale    positive bubble study  . Headache(784.0)    she thinks sinus headaches  . History of cerebral artery stenosis    right middle  . HYPERLIPIDEMIA 03/03/2010  . Hypertension   . HYPOTHYROIDISM 03/03/2010   no longer on meds  . Internal hemorrhoid   . Low back pain   . Mastoiditis    noted on brain MRI  . Rib fracture   . Right leg pain   . Shortness of breath   . Stroke (HCC) Oct. 2011   TIA  . Ulcer (HCC)     PAST SURGICAL HISTORY: Past Surgical History:  Procedure Laterality Date  . BREAST LUMPECTOMY     right  . CHOLECYSTECTOMY    . COLONOSCOPY    . SHOULDER SURGERY     right shoulder  (dislocation)  . TONSILLECTOMY    . TUBAL LIGATION    . VIDEO BRONCHOSCOPY WITH ENDOBRONCHIAL ULTRASOUND N/A 01/07/2014   Procedure: VIDEO BRONCHOSCOPY WITH ENDOBRONCHIAL ULTRASOUND;  Surgeon: Peter Van Trigt, MD;  Location: [facility] OR;  Service: Thoracic;  Laterality: N/A;    FAMILY HISTORY Family History  Problem Relation Age of Onset  . Heart disease Sister     cerebral vascular disease also  . Heart disease Brother     cerebral vascular disease also  . Heart disease Brother     cerebral vascular disease  . Heart disease Sister     cebreal vascular disease also  . Heart disease Sister     cebreal vascular diease also  . Heart disease Sister     cerebral vascular diease also  . Stomach cancer Father   . Colon cancer Neg Hx   . Esophageal cancer Neg Hx   . Rectal cancer Neg Hx   The patient's father died from stomach cancer in his late 40s. The patient's mother died in her 60s from a stroke. The patient has 2 brothers, 4 sisters. One sister had leukemia. There is no history of breast, colon,   or ovarian cancer in the family to her knowledge  GYNECOLOGIC HISTORY:  No LMP recorded. Patient is postmenopausal. Menarche age 41, first live birth age 51, the patient is [place] P2. She went through menopause in her late 24s. She took no hormone replacement. She took oral contraceptives for approximately 2 years remotely without complications.  SOCIAL HISTORY:  Graceann is originally from [place]. 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 Her husband Mortimer Fries is a retired substance abuse Social worker. This is a second marriage for both of them. Carola has 2 children from her first marriage, [name], lives in [place], and worked as a Audiological scientist but is now disabled, and Lawrence Santiago, who lives in [place] and works in Engineer, mining. The patient has 6 grandchildren. She attends [employer].--Mikki Santee has 3 children from his first marriage. [name]  Junior lives in [place] and has his own trucking business. He tells me he is estranged from his 2 daughters Amedeo Gory (a retired Pharmacist, hospital) and Salena Saner (who works in a bank). They living [place].    ADVANCED DIRECTIVES: in place   HEALTH MAINTENANCE: Social History  Substance Use Topics  . Smoking status: Former Smoker    Packs/day: 0.50    Years: 20.00    Types: Cigarettes    Quit date: 05/01/1991  . Smokeless tobacco: Never Used  . Alcohol use No     Colonoscopy:  PAP:  Bone density:   Allergies  Allergen Reactions  . Ciprofloxacin Anaphylaxis    Current Outpatient Prescriptions  Medication Sig Dispense Refill  . ALPRAZolam (XANAX) 0.25 MG tablet TAKE 1 TABLET BY MOUTH THREE TIMES DAILY AS NEEDED FOR ANXIETY 90 tablet 0  . aspirin EC 81 MG tablet Take 81 mg by mouth daily.    Marland Kitchen CARTIA XT 120 MG 24 hr capsule TAKE 1 CAPSULE BY MOUTH DAILY 90 capsule 0  . Cholecalciferol (VITAMIN D PO) Take 1,000 Units by mouth 2 (two) times daily.    Marland Kitchen docusate sodium (COLACE) 100 MG capsule Take 100 mg by mouth daily as needed for mild constipation.    . Flaxseed, Linseed, (FLAX SEEDS PO) Take by mouth daily.    . Fulvestrant (FASLODEX IM) Inject into the muscle.    . Glucosamine 500 MG CAPS Take by mouth.    . meclizine (ANTIVERT) 25 MG tablet TAKE 1 TABLET(25 MG) BY MOUTH EVERY 6 HOURS AS NEEDED FOR DIZZINESS (Patient not taking: Reported on 06/14/2016) 60 tablet 0  . Multiple Vitamins-Minerals (MULTIVITAMIN PO) Take by mouth daily.    . palbociclib (IBRANCE) 75 MG capsule Take 1 capsule (75 mg total) by mouth daily with breakfast. Take whole with food. 21 capsule 6  . Probiotic Product (PROBIOTIC DAILY PO) Take by mouth daily.    . simvastatin (ZOCOR) 5 MG tablet TAKE 1 TABLET BY MOUTH DAILY 90 tablet 1   No current facility-administered medications for this visit.     OBJECTIVE: Older Latin American womanWho appears stated age  76:   07/12/16 0845  BP: 135/70  Pulse: 73    Resp: 18  Temp: 97.6 F (36.4 C)     Body mass index is 22.53 kg/m.    ECOG FS:1 - Symptomatic but completely ambulatory  Sclerae unicteric, EOMs intact Oropharynx clear and moist No cervical or supraclavicular adenopathy Lungs no rales or rhonchi Heart regular rate and rhythm Abd soft, nontender, positive bowel sounds MSK no focal spinal tenderness, no upper extremity  lymphedema Neuro: nonfocal, well oriented, appropriate affect Breasts: Deferred  LAB RESULTS:  CMP     Component Value Date/Time   NA 140 06/14/2016 1159   K 4.6 06/14/2016 1159   CL 104 01/06/2014 0910   CO2 31 (H) 06/14/2016 1159   GLUCOSE 95 06/14/2016 1159   BUN 13.1 06/14/2016 1159   CREATININE 0.8 06/14/2016 1159   CALCIUM 9.8 06/14/2016 1159   PROT 7.1 06/14/2016 1159   ALBUMIN 3.7 06/14/2016 1159   AST 18 06/14/2016 1159   ALT 15 06/14/2016 1159   ALKPHOS 70 06/14/2016 1159   BILITOT 0.29 06/14/2016 1159   GFRNONAA >90 01/06/2014 0910   GFRAA >90 01/06/2014 0910    INo results found for: SPEP, UPEP  Lab Results  Component Value Date   WBC 2.9 (L) 07/12/2016   NEUTROABS 1.3 (L) 07/12/2016   HGB 12.7 07/12/2016   HCT 37.6 07/12/2016   MCV 96.7 07/12/2016   PLT 128 (L) 07/12/2016      Chemistry      Component Value Date/Time   NA 140 06/14/2016 1159   K 4.6 06/14/2016 1159   CL 104 01/06/2014 0910   CO2 31 (H) 06/14/2016 1159   BUN 13.1 06/14/2016 1159   CREATININE 0.8 06/14/2016 1159      Component Value Date/Time   CALCIUM 9.8 06/14/2016 1159   ALKPHOS 70 06/14/2016 1159   AST 18 06/14/2016 1159   ALT 15 06/14/2016 1159   BILITOT 0.29 06/14/2016 1159       Lab Results  Component Value Date   LABCA2 25 08/30/2015    No components found for: LABCA125  No results for input(s): INR in the last 168 hours.  Urinalysis    Component Value Date/Time   COLORURINE YELLOW 11/18/2013 1321   APPEARANCEUR CLEAR 11/18/2013 1321   LABSPEC 1.008 11/18/2013 1321   PHURINE 7.5  11/18/2013 1321   GLUCOSEU NEGATIVE 11/18/2013 1321   HGBUR NEGATIVE 11/18/2013 1321   BILIRUBINUR neg 02/16/2015 1318   KETONESUR NEGATIVE 11/18/2013 1321   PROTEINUR neg 02/16/2015 1318   PROTEINUR NEGATIVE 11/18/2013 1321   UROBILINOGEN 0.2 02/16/2015 1318   UROBILINOGEN 0.2 11/18/2013 1321   NITRITE neg 02/16/2015 1318   NITRITE NEGATIVE 11/18/2013 1321   LEUKOCYTESUR Negative 02/16/2015 1318     STUDIES:  Ct Chest W Contrast  Result Date: 07/09/2016 CLINICAL DATA:  Breast cancer with recurrence. Radiation therapy complete. Shortness of breath. EXAM: CT CHEST WITH CONTRAST TECHNIQUE: Multidetector CT imaging of the chest was performed during intravenous contrast administration. CONTRAST:  75mL ISOVUE-300 IOPAMIDOL (ISOVUE-300) INJECTION 61% COMPARISON:  01/27/2016. FINDINGS: Cardiovascular: Atherosclerotic calcification of the arterial vasculature, including coronary arteries. Heart size normal. No pericardial effusion. Mediastinum/Nodes: Mediastinal adenopathy persists. Index low right paratracheal lymph node measures 12 mm, stable. Left paratracheal adenopathy measures 1.5 cm, also stable. Bi hilar adenopathy up to 12 mm on the right, stable. Subcarinal adenopathy, 1.4 cm, stable. Right internal mammary lymph node, 8 mm, stable. Esophagus is grossly unremarkable. Lungs/Pleura: Biapical pleuroparenchymal scarring. Scattered pulmonary nodules are basilar predominant and measure up to 7 mm in the right lower lobe, stable. Mild scattered pulmonary parenchymal scarring. Scattered calcified granulomas. No pleural fluid. There may be debris in the airway, at the carina and proximal mainstem bronchi. No pleural fluid. Upper Abdomen: Visualized portions of the liver an adrenal glands are unremarkable. Cholecystectomy. Subcentimeter low-attenuation lesion in the right kidney is too small to characterize. Visualized portions of the left kidney, spleen, pancreas, stomach and bowel   are grossly  unremarkable. No upper abdominal adenopathy. Musculoskeletal: Focal mottling is seen in the posterior left fifth rib, as before. Vague patchy sclerosis in the T7 and T8 vertebral bodies, nonspecific. Degenerative changes are seen in the spine. IMPRESSION: 1. Mediastinal/hilar/right internal mammary adenopathy and bilobed pulmonary nodules are stable. 2. Left fifth rib metastasis, as before. Vague areas of patchy sclerosis in the T7 and T8 vertebral bodies. Continued attention on followup exams is warranted. 3. No pulmonary parenchymal findings to explain the patient's shortness of breath. 4. Aortic atherosclerosis (ICD10-170.0). Coronary artery calcification. Electronically Signed   By: Lorin Picket M.D.   On: 07/09/2016 09:00   Nm Bone Scan Whole Body  Result Date: 07/09/2016 CLINICAL DATA:  History of breast malignancy metastatic to the bone EXAM: NUCLEAR MEDICINE WHOLE BODY BONE SCAN TECHNIQUE: Whole body anterior and posterior images were obtained approximately 3 hours after intravenous injection of radiopharmaceutical. RADIOPHARMACEUTICALS:  Twenty-one mCi Technetium-52mMDP IV COMPARISON:  Nuclear bone scan of January 27, 2016 FINDINGS: There is adequate uptake of the radiopharmaceutical by the skeleton. There is adequate soft tissue clearance and renal activity. There is persistent increased uptake in the right parietal bone. There is a focus of increased uptake at approximately T8 and at T12 or L1. Minimal increased uptake at L5 is present. There is stable increased uptake involving the medial aspect of the left approximately fourth rib. There is a stable focus of increased uptake in the proximal third of the right femur. Radiology Dr. were okay be there short lymphatic it IMPRESSION: Stable appearance of metastatic disease within the calvarium, spine, left posterior fourth rib, and proximal right femoral shaft. No new lesions are observed. Electronically Signed   By: David  JMartiniqueM.D.   On:  07/09/2016 12:05     ELIGIBLE FOR AVAILABLE RESEARCH PROTOCOL: no  ASSESSMENT: 75y.o. [place] woman  (1) status post right breast upper outer quadrant lumpectomy and axillary lymph node dissection in 2007 for what appears to have been a T1 N0, stage IA invasive ductal carcinoma, treated adjuvantly with radiation (33 sessions)  METASTATIC DISEASE definitively documented Sept 2015 (2) bronchoscopic biopsy 01/07/2014 showed a low-grade mucinous breast cancer, strongly estrogen receptor positive, progesterone receptor positive, HER-2 negative; staging studies confirmed extensive hypermetabolic adenopathy, multiple bone lesions, likely early lung involvement, but no liver lesions.  (3) on Arimidex between September 2015 and June 2017, with evidence of response; discontinued secondary to side effects  (a) bone density 04/10/2016 shows osteopenia with a T score of -2.1  (4) on monthly denosumab/Xgeva between 07/30/2014 and 10/04/2015, discontinued due to patient's concerns regarding osteonecrosis of the jaw  (a) discussed again October 2017, the patient adamantly refusing denosumab  (5) started fulvestrant 01/26/2016   (a) added at 75 mg QOD, 21/7, beginning mid February 2018  PLAN I spent approximately 30 minutes with Chanci with most of that time spent discussing her situation. The good news is that she is tolerating the palbociclib without any unusual side effects were really without any side effects at all that she is aware of.  The other side of the colon though is that even this very minimal dose as dropped her counts so that her ARancho Murietatoday was less than 1.5.  I don't think she is going to tolerate more then 75 mg every other day, which is the lowest dose for this particular drug. We could switch to abemaciclib which is more neutrophil sparing, but given her multiple GI problems and the diarrhea that occurs without  drug I don't think that would work.  Accordingly the plan as far as her  breast cancer treatment is concerned is to continue the fulvestrant as before, and continue the palbociclib at 75 mg every other day.  I am going to check her lab work again March 29. 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  Quite aside from this her husband today brought me a letter that he interpreted wise from her pharmacist. It was actually from the [employer] saying that he has a minimum distribution from her [employer]. I called her and let her know that we are adding lab work on 07/26/2016 and that she can bring me the right letter at that time.  As far as her GI problems are concerned, she was doing much better on omeprazole. She thinks 40 mg is too much so I put her in for 20 mg daily. I think if she does that at least 1 over 3 chronic GI issues will improve  They know to call for any problems that may develop before the next visit here.       :Chauncey Cruel, MD   07/12/2016 9:03 AM Medical Oncology and Hematology [facility] [address], [place] [address] Tel. [phone]    Fax. [phone]

## 2016-07-12 NOTE — Patient Instructions (Signed)

## 2016-07-12 NOTE — Telephone Encounter (Signed)
Gave patient avs report and appointments for April thru august.

## 2016-07-13 ENCOUNTER — Telehealth: Payer: Self-pay | Admitting: Oncology

## 2016-07-13 LAB — CANCER ANTIGEN 27.29: CAN 27.29: 56.3 U/mL — AB (ref 0.0–38.6)

## 2016-07-13 NOTE — Telephone Encounter (Signed)
Spoke to husband and is aware of new appt added for 3/29 for lab only per 3/15 sch msg

## 2016-07-26 ENCOUNTER — Other Ambulatory Visit (HOSPITAL_BASED_OUTPATIENT_CLINIC_OR_DEPARTMENT_OTHER): Payer: Medicare Other

## 2016-07-26 DIAGNOSIS — C50911 Malignant neoplasm of unspecified site of right female breast: Secondary | ICD-10-CM

## 2016-07-26 DIAGNOSIS — C50411 Malignant neoplasm of upper-outer quadrant of right female breast: Secondary | ICD-10-CM | POA: Diagnosis present

## 2016-07-26 DIAGNOSIS — C7951 Secondary malignant neoplasm of bone: Secondary | ICD-10-CM

## 2016-07-26 LAB — COMPREHENSIVE METABOLIC PANEL
ALBUMIN: 3.8 g/dL (ref 3.5–5.0)
ALK PHOS: 65 U/L (ref 40–150)
ALT: 24 U/L (ref 0–55)
AST: 18 U/L (ref 5–34)
Anion Gap: 9 mEq/L (ref 3–11)
BILIRUBIN TOTAL: 0.51 mg/dL (ref 0.20–1.20)
BUN: 14.3 mg/dL (ref 7.0–26.0)
CALCIUM: 9.3 mg/dL (ref 8.4–10.4)
CO2: 26 mEq/L (ref 22–29)
Chloride: 103 mEq/L (ref 98–109)
Creatinine: 0.8 mg/dL (ref 0.6–1.1)
EGFR: 71 mL/min/{1.73_m2} — ABNORMAL LOW (ref >90)
GLUCOSE: 131 mg/dL (ref 70–140)
Potassium: 3.6 mEq/L (ref 3.5–5.1)
Sodium: 138 mEq/L (ref 136–145)
TOTAL PROTEIN: 6.8 g/dL (ref 6.4–8.3)

## 2016-07-26 LAB — CBC WITH DIFFERENTIAL/PLATELET
BASO%: 0.7 % (ref 0.0–2.0)
Basophils Absolute: 0 10*3/uL (ref 0.0–0.1)
EOS%: 4.6 % (ref 0.0–7.0)
Eosinophils Absolute: 0.1 10*3/uL (ref 0.0–0.5)
HCT: 39 % (ref 34.8–46.6)
HGB: 13.3 g/dL (ref 11.6–15.9)
LYMPH%: 50 % — AB (ref 14.0–49.7)
MCH: 33.2 pg (ref 25.1–34.0)
MCHC: 34.1 g/dL (ref 31.5–36.0)
MCV: 97.3 fL (ref 79.5–101.0)
MONO#: 0.1 10*3/uL (ref 0.1–0.9)
MONO%: 3.9 % (ref 0.0–14.0)
NEUT%: 40.8 % (ref 38.4–76.8)
NEUTROS ABS: 1.2 10*3/uL — AB (ref 1.5–6.5)
PLATELETS: 176 10*3/uL (ref 145–400)
RBC: 4.01 10*6/uL (ref 3.70–5.45)
RDW: 14.1 % (ref 11.2–14.5)
WBC: 2.8 10*3/uL — AB (ref 3.9–10.3)
lymph#: 1.4 10*3/uL (ref 0.9–3.3)

## 2016-08-01 ENCOUNTER — Other Ambulatory Visit (HOSPITAL_BASED_OUTPATIENT_CLINIC_OR_DEPARTMENT_OTHER): Payer: Medicare Other

## 2016-08-01 DIAGNOSIS — C7951 Secondary malignant neoplasm of bone: Secondary | ICD-10-CM

## 2016-08-01 DIAGNOSIS — C50411 Malignant neoplasm of upper-outer quadrant of right female breast: Secondary | ICD-10-CM | POA: Diagnosis not present

## 2016-08-01 DIAGNOSIS — C50911 Malignant neoplasm of unspecified site of right female breast: Secondary | ICD-10-CM

## 2016-08-01 LAB — CBC WITH DIFFERENTIAL/PLATELET
BASO%: 1.7 % (ref 0.0–2.0)
Basophils Absolute: 0.1 10*3/uL (ref 0.0–0.1)
EOS%: 2.9 % (ref 0.0–7.0)
Eosinophils Absolute: 0.1 10*3/uL (ref 0.0–0.5)
HCT: 37.9 % (ref 34.8–46.6)
HEMOGLOBIN: 12.9 g/dL (ref 11.6–15.9)
LYMPH%: 40.7 % (ref 14.0–49.7)
MCH: 32.7 pg (ref 25.1–34.0)
MCHC: 34 g/dL (ref 31.5–36.0)
MCV: 96.2 fL (ref 79.5–101.0)
MONO#: 0.2 10*3/uL (ref 0.1–0.9)
MONO%: 6.3 % (ref 0.0–14.0)
NEUT#: 1.7 10*3/uL (ref 1.5–6.5)
NEUT%: 48.4 % (ref 38.4–76.8)
PLATELETS: 173 10*3/uL (ref 145–400)
RBC: 3.94 10*6/uL (ref 3.70–5.45)
RDW: 14.3 % (ref 11.2–14.5)
WBC: 3.5 10*3/uL — ABNORMAL LOW (ref 3.9–10.3)
lymph#: 1.4 10*3/uL (ref 0.9–3.3)

## 2016-08-01 LAB — COMPREHENSIVE METABOLIC PANEL
ALBUMIN: 3.7 g/dL (ref 3.5–5.0)
ALK PHOS: 59 U/L (ref 40–150)
ALT: 19 U/L (ref 0–55)
AST: 19 U/L (ref 5–34)
Anion Gap: 7 mEq/L (ref 3–11)
BILIRUBIN TOTAL: 0.52 mg/dL (ref 0.20–1.20)
BUN: 10.9 mg/dL (ref 7.0–26.0)
CO2: 23 meq/L (ref 22–29)
Calcium: 9.1 mg/dL (ref 8.4–10.4)
Chloride: 106 mEq/L (ref 98–109)
Creatinine: 0.7 mg/dL (ref 0.6–1.1)
EGFR: 80 mL/min/{1.73_m2} — ABNORMAL LOW (ref >90)
GLUCOSE: 122 mg/dL (ref 70–140)
Potassium: 3.7 mEq/L (ref 3.5–5.1)
SODIUM: 136 meq/L (ref 136–145)
Total Protein: 6.9 g/dL (ref 6.4–8.3)

## 2016-08-02 ENCOUNTER — Telehealth: Payer: Self-pay | Admitting: Internal Medicine

## 2016-08-02 ENCOUNTER — Other Ambulatory Visit: Payer: Self-pay | Admitting: Internal Medicine

## 2016-08-02 LAB — CANCER ANTIGEN 27.29: CA 27.29: 58.9 U/mL — ABNORMAL HIGH (ref 0.0–38.6)

## 2016-08-02 NOTE — Telephone Encounter (Signed)
Pt need new Rx for alprazolam   Pharm:  [facility]  [address]   Pt is aware that Dr. Raliegh Ip is out of the office and will return on 08/07/16.  Pt state that she will be out of this medication before Dr. Raliegh Ip returns and also state that it is not due until 08/16/16.

## 2016-08-09 ENCOUNTER — Other Ambulatory Visit (HOSPITAL_BASED_OUTPATIENT_CLINIC_OR_DEPARTMENT_OTHER): Payer: Medicare Other

## 2016-08-09 ENCOUNTER — Ambulatory Visit (HOSPITAL_BASED_OUTPATIENT_CLINIC_OR_DEPARTMENT_OTHER): Payer: Medicare Other

## 2016-08-09 VITALS — BP 131/76 | HR 63 | Temp 98.2°F | Resp 16

## 2016-08-09 DIAGNOSIS — C50411 Malignant neoplasm of upper-outer quadrant of right female breast: Secondary | ICD-10-CM

## 2016-08-09 DIAGNOSIS — C7951 Secondary malignant neoplasm of bone: Secondary | ICD-10-CM

## 2016-08-09 DIAGNOSIS — C50911 Malignant neoplasm of unspecified site of right female breast: Secondary | ICD-10-CM

## 2016-08-09 DIAGNOSIS — Z853 Personal history of malignant neoplasm of breast: Secondary | ICD-10-CM

## 2016-08-09 DIAGNOSIS — Z5111 Encounter for antineoplastic chemotherapy: Secondary | ICD-10-CM

## 2016-08-09 LAB — COMPREHENSIVE METABOLIC PANEL
ALK PHOS: 61 U/L (ref 40–150)
ALT: 24 U/L (ref 0–55)
ANION GAP: 8 meq/L (ref 3–11)
AST: 19 U/L (ref 5–34)
Albumin: 3.8 g/dL (ref 3.5–5.0)
BUN: 15.3 mg/dL (ref 7.0–26.0)
CALCIUM: 9.4 mg/dL (ref 8.4–10.4)
CHLORIDE: 104 meq/L (ref 98–109)
CO2: 28 mEq/L (ref 22–29)
Creatinine: 0.8 mg/dL (ref 0.6–1.1)
EGFR: 71 mL/min/{1.73_m2} — AB (ref >90)
Glucose: 87 mg/dl (ref 70–140)
POTASSIUM: 4.7 meq/L (ref 3.5–5.1)
Sodium: 140 mEq/L (ref 136–145)
TOTAL PROTEIN: 6.9 g/dL (ref 6.4–8.3)
Total Bilirubin: 0.54 mg/dL (ref 0.20–1.20)

## 2016-08-09 LAB — CBC WITH DIFFERENTIAL/PLATELET
BASO%: 1.7 % (ref 0.0–2.0)
Basophils Absolute: 0.1 10*3/uL (ref 0.0–0.1)
EOS ABS: 0.2 10*3/uL (ref 0.0–0.5)
EOS%: 5.7 % (ref 0.0–7.0)
HCT: 39.2 % (ref 34.8–46.6)
HEMOGLOBIN: 13.2 g/dL (ref 11.6–15.9)
LYMPH%: 40.1 % (ref 14.0–49.7)
MCH: 33.1 pg (ref 25.1–34.0)
MCHC: 33.7 g/dL (ref 31.5–36.0)
MCV: 98.2 fL (ref 79.5–101.0)
MONO#: 0.3 10*3/uL (ref 0.1–0.9)
MONO%: 7.2 % (ref 0.0–14.0)
NEUT%: 45.3 % (ref 38.4–76.8)
NEUTROS ABS: 1.8 10*3/uL (ref 1.5–6.5)
PLATELETS: 146 10*3/uL (ref 145–400)
RBC: 3.99 10*6/uL (ref 3.70–5.45)
RDW: 14.4 % (ref 11.2–14.5)
WBC: 4 10*3/uL (ref 3.9–10.3)
lymph#: 1.6 10*3/uL (ref 0.9–3.3)

## 2016-08-09 MED ORDER — FULVESTRANT 250 MG/5ML IM SOLN
500.0000 mg | Freq: Once | INTRAMUSCULAR | Status: AC
  Administered 2016-08-09: 500 mg via INTRAMUSCULAR
  Filled 2016-08-09: qty 10

## 2016-08-09 NOTE — Patient Instructions (Signed)

## 2016-08-15 ENCOUNTER — Other Ambulatory Visit (HOSPITAL_BASED_OUTPATIENT_CLINIC_OR_DEPARTMENT_OTHER): Payer: Medicare Other

## 2016-08-15 ENCOUNTER — Ambulatory Visit (HOSPITAL_BASED_OUTPATIENT_CLINIC_OR_DEPARTMENT_OTHER): Payer: Medicare Other | Admitting: Oncology

## 2016-08-15 ENCOUNTER — Other Ambulatory Visit: Payer: Self-pay | Admitting: Internal Medicine

## 2016-08-15 VITALS — BP 152/76 | HR 72 | Temp 97.7°F | Resp 18 | Wt 126.1 lb

## 2016-08-15 DIAGNOSIS — C50911 Malignant neoplasm of unspecified site of right female breast: Secondary | ICD-10-CM

## 2016-08-15 DIAGNOSIS — C7951 Secondary malignant neoplasm of bone: Secondary | ICD-10-CM | POA: Diagnosis not present

## 2016-08-15 DIAGNOSIS — M8580 Other specified disorders of bone density and structure, unspecified site: Secondary | ICD-10-CM | POA: Diagnosis not present

## 2016-08-15 DIAGNOSIS — C50411 Malignant neoplasm of upper-outer quadrant of right female breast: Secondary | ICD-10-CM

## 2016-08-15 DIAGNOSIS — Z170 Estrogen receptor positive status [ER+]: Secondary | ICD-10-CM

## 2016-08-15 LAB — CBC WITH DIFFERENTIAL/PLATELET
BASO%: 1.2 % (ref 0.0–2.0)
Basophils Absolute: 0 10*3/uL (ref 0.0–0.1)
EOS%: 6.3 % (ref 0.0–7.0)
Eosinophils Absolute: 0.2 10*3/uL (ref 0.0–0.5)
HCT: 38.7 % (ref 34.8–46.6)
HGB: 13.2 g/dL (ref 11.6–15.9)
LYMPH%: 39.3 % (ref 14.0–49.7)
MCH: 33.6 pg (ref 25.1–34.0)
MCHC: 34.2 g/dL (ref 31.5–36.0)
MCV: 98.1 fL (ref 79.5–101.0)
MONO#: 0.2 10*3/uL (ref 0.1–0.9)
MONO%: 4.5 % (ref 0.0–14.0)
NEUT#: 1.7 10*3/uL (ref 1.5–6.5)
NEUT%: 48.7 % (ref 38.4–76.8)
PLATELETS: 185 10*3/uL (ref 145–400)
RBC: 3.94 10*6/uL (ref 3.70–5.45)
RDW: 15.3 % — ABNORMAL HIGH (ref 11.2–14.5)
WBC: 3.4 10*3/uL — ABNORMAL LOW (ref 3.9–10.3)
lymph#: 1.3 10*3/uL (ref 0.9–3.3)

## 2016-08-15 LAB — COMPREHENSIVE METABOLIC PANEL
ALT: 15 U/L (ref 0–55)
AST: 17 U/L (ref 5–34)
Albumin: 3.8 g/dL (ref 3.5–5.0)
Alkaline Phosphatase: 55 U/L (ref 40–150)
Anion Gap: 10 mEq/L (ref 3–11)
BUN: 13.9 mg/dL (ref 7.0–26.0)
CHLORIDE: 104 meq/L (ref 98–109)
CO2: 24 mEq/L (ref 22–29)
CREATININE: 0.8 mg/dL (ref 0.6–1.1)
Calcium: 9.1 mg/dL (ref 8.4–10.4)
EGFR: 69 mL/min/{1.73_m2} — ABNORMAL LOW (ref >90)
Glucose: 129 mg/dl (ref 70–140)
POTASSIUM: 4 meq/L (ref 3.5–5.1)
SODIUM: 139 meq/L (ref 136–145)
Total Bilirubin: 0.55 mg/dL (ref 0.20–1.20)
Total Protein: 6.9 g/dL (ref 6.4–8.3)

## 2016-08-15 NOTE — Progress Notes (Signed)
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 She continues on fulvestrant monthly, with her most recent dose 08/09/2016. She tolerates it well aside from the obvious discomfort of the administration.  She is also on palbociclib, at 75 mg every other day. She is currently in her second week. She has no side effects from this that she is aware of.  REVIEW OF SYSTEMS: Erica Young is generally doing well. She is managing her multiple bowel problems without medication, chiefly by using prune juice and eating more fiber. She tends to be on the constipated side. She denies bony pain. She has significant problems with acce and does not like having to have lab work every single time she comes in. She sleeps poorly, has sinus problems, and has aches and pains here and there which are not more intense or persistent than before a detailed review of systems today was otherwise stable   BREAST CANCER HISTORY: From the earlier summary note  Erica Young tells me in 2007 while living in [place] she was found to have a very small cancer in the upper-outer quadrant of the right breast, about the size of the P, noted on mammography. It was not palpable. She had a right lumpectomy and full sentinel  lymph node sampling. She then received adjuvant radiation, to a total of 33 treatments. She received no systemic therapy.  She then did well until December 2014, when she had a fall and complain of pain in her left ribs. Rib films and chest x-ray on 04/13/2013 showed no fractures, but CT of the abdomen and pelvis on the same day found subcarinal and right hilar adenopathy. This was followed up with a chest CT scan 05/05/2013 confirming extensive mediastinal and right hilar lymphadenopathy, with multiple pulmonary nodules, the largest measuring 0.9 cm. PET scan 05/21/2013 showed hypermetabolic adenopathy in the right and left paratracheal areas, the precarinal, subcarinal and right hilar areas, but no involvement of the liver, and the lung nodules were not hypermetabolic (although they were below the level of reliable detection). In addition, at L5 there was a lucency measuring 1.2 cm.  The PET scan also showed a hypermetabolic focus on the thyroid gland, which was evaluated with neck ultrasound and biopsy 75/01/2584, showing a follicular lesion of undetermined significance (([id number]).  The patient was referred to pulmonary [Dr Melvyn Novas and Dr Julien Nordmann for further evaluation. Repeat PET scan 12/28/2013 showed, in addition to the adenopathy previously noted, now multiple bony metastases. On 01/07/2014 the patient underwent bronchoscopy and this showed (SZA [id number], together with separate cytology] a low-grade mucinous invasive breast cancer, estrogen receptor 100% positive, progesterone receptor 14% positive, with no HER-2 amplification, the signals ratio being 1.30 and the number per cell 1.95.  The patient was started on anastrozole 01/22/2014; monthly denosumab/Xgeva was added 07/30/2014. She appeared to tolerate this well, and her CA-27-29 (127 at baseline) normalized.  Most recent CT scans of chest abdomen and pelvis 10/03/2015 showed continuing response. Despite this good news however, the patient decided  to go off anastrozole and denosumab/Xgeva as of June 2017. By the time she saw Dr. Earlie Server again in September 2017 her tumor marker had doubled. At that time the patient was referred to the breast clinic for a second opinion regarding further evaluation and treatment.   PAST MEDICAL HISTORY: Past Medical History:  Diagnosis Date  . Allergy   . Anxiety   . Bone cancer ([place]) mri 11/11/14   right parietal bone,left cribriform plate metastases  . Cancer [facility]) 2007   right breat ca  , lumpectomy and radiation tx (declined chemo and additional prophylactic meds)  . CEREBROVASCULAR DISEASE 03/03/2010  . Diverticulitis   . DIVERTICULOSIS, COLON 03/03/2010  . Fatty liver 08/02/09   as per U/S done by [facility]  . Foramen ovale    positive bubble study  . Headache(784.0)    she thinks sinus headaches  . History of cerebral artery stenosis    right middle  . HYPERLIPIDEMIA 03/03/2010  . Hypertension   . HYPOTHYROIDISM 03/03/2010   no longer on meds  . Internal hemorrhoid   . Low back pain   . Mastoiditis    noted on brain MRI  . Rib fracture   . Right leg pain   . Shortness of breath   . Stroke [facility]) Oct. 2011   TIA  . Ulcer ([place])     PAST SURGICAL HISTORY: Past Surgical History:  Procedure Laterality Date  . BREAST LUMPECTOMY     right  . CHOLECYSTECTOMY    . COLONOSCOPY    . SHOULDER SURGERY     right shoulder (dislocation)  . TONSILLECTOMY    . TUBAL LIGATION    . VIDEO BRONCHOSCOPY WITH ENDOBRONCHIAL ULTRASOUND N/A 01/07/2014   Procedure: VIDEO BRONCHOSCOPY WITH ENDOBRONCHIAL ULTRASOUND;  Surgeon: Ivin Poot, MD;  Location: [facility] OR;  Service: Thoracic;  Laterality: N/A;    FAMILY HISTORY Family History  Problem Relation Age of Onset  . Heart disease Sister     cerebral vascular disease also  . Heart disease Brother     cerebral vascular disease also  . Heart disease Brother     cerebral vascular disease  . Heart disease Sister     cebreal vascular  disease also  . Heart disease Sister     cebreal vascular diease also  . Heart disease Sister     cerebral vascular diease also  . Stomach cancer Father   . Colon cancer Neg Hx   . Esophageal cancer Neg Hx   . Rectal cancer Neg Hx   The patient's father died from stomach cancer in his late 66s. The patient's mother died in her 68s from a stroke. The patient has 2 brothers, 4 sisters. One sister had leukemia. There is no history of breast, colon, or ovarian cancer in the family to her knowledge  GYNECOLOGIC HISTORY:  No LMP recorded. Patient is postmenopausal. Menarche age 36, first live birth age 56, the patient is Erica Young. She went through menopause in her late 54s. She took no hormone replacement. She took oral contraceptives for approximately 2 years remotely without complications.  SOCIAL HISTORY:  Erica Young is originally from [place]. 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 Her husband Mortimer Fries is a retired substance abuse Social worker. This is a second marriage for both of them. Erica Young has  2 children from her first marriage, Erica Young, lives in [place], and worked as a Audiological scientist but is now disabled, and Erica Young, who lives in [place] and works in Engineer, mining. The patient has 6 grandchildren. She attends [employer].--Mikki Santee has 3 children from his first marriage. [name] Junior lives in [place] and has his own trucking business. He tells me he is estranged from his 2 daughters Amedeo Gory (a retired Pharmacist, hospital) and Salena Saner (who works in a bank). They living [place].    ADVANCED DIRECTIVES: in place   HEALTH MAINTENANCE: Social History  Substance Use Topics  . Smoking status: Former Smoker    Packs/day: 0.50    Years: 20.00    Types: Cigarettes    Quit date: 05/01/1991  . Smokeless tobacco: Never Used  . Alcohol use No     Colonoscopy:  PAP:  Bone density:   Allergies  Allergen Reactions  . Ciprofloxacin  Anaphylaxis    Current Outpatient Prescriptions  Medication Sig Dispense Refill  . ALPRAZolam (XANAX) 0.25 MG tablet TAKE 1 TABLET BY MOUTH THREE TIMES DAILY AS NEEDED FOR ANXIETY 90 tablet 0  . aspirin EC 81 MG tablet Take 81 mg by mouth daily.    Marland Kitchen CARTIA XT 120 MG 24 hr capsule TAKE 1 CAPSULE BY MOUTH DAILY 90 capsule 0  . Cholecalciferol (VITAMIN D PO) Take 1,000 Units by mouth 2 (two) times daily.    Marland Kitchen docusate sodium (COLACE) 100 MG capsule Take 100 mg by mouth daily as needed for mild constipation.    . Flaxseed, Linseed, (FLAX SEEDS PO) Take by mouth daily.    . Fulvestrant (FASLODEX IM) Inject into the muscle.    . Glucosamine 500 MG CAPS Take by mouth.    . meclizine (ANTIVERT) 25 MG tablet TAKE 1 TABLET(25 MG) BY MOUTH EVERY 6 HOURS AS NEEDED FOR DIZZINESS (Patient not taking: Reported on 06/14/2016) 60 tablet 0  . Multiple Vitamins-Minerals (MULTIVITAMIN PO) Take by mouth daily.    Marland Kitchen omeprazole (PRILOSEC) 20 MG capsule Take 1 capsule (20 mg total) by mouth daily. 90 capsule 4  . palbociclib (IBRANCE) 75 MG capsule Take 1 capsule (75 mg total) by mouth daily with breakfast. Take whole with food. 21 capsule 6  . Probiotic Product (PROBIOTIC DAILY PO) Take by mouth daily.    . simvastatin (ZOCOR) 5 MG tablet TAKE 1 TABLET BY MOUTH DAILY 90 tablet 1  . simvastatin (ZOCOR) 5 MG tablet TAKE 1 TABLET BY MOUTH DAILY 90 tablet 1   No current facility-administered medications for this visit.     OBJECTIVE: Older Latin American woman in no acute distress   Vitals:   08/15/16 1233  BP: (!) 152/76  Pulse: 72  Resp: 18  Temp: 97.7 F (36.5 C)     Body mass index is 23.06 kg/m.    ECOG FS:1 - Symptomatic but completely ambulatory  Sclerae unicteric, pupils round and equal Oropharynx clear and moist No cervical or supraclavicular adenopathy Lungs no rales or rhonchi Heart regular rate and rhythm Abd soft, nontender, positive bowel sounds MSK no focal spinal tenderness, no upper  extremity lymphedema Neuro: nonfocal, well oriented, appropriate affect Breasts: Deferred   LAB RESULTS:  CMP     Component Value Date/Time   NA 140 08/09/2016 0852   K 4.7 08/09/2016 0852   CL 104 01/06/2014 0910   CO2 28 08/09/2016 0852   GLUCOSE 87 08/09/2016 0852   BUN 15.3 08/09/2016 0852   CREATININE  0.8 08/09/2016 0852   CALCIUM 9.4 08/09/2016 0852   PROT 6.9 08/09/2016 0852   ALBUMIN 3.8 08/09/2016 0852   AST 19 08/09/2016 0852   ALT 24 08/09/2016 0852   ALKPHOS 61 08/09/2016 0852   BILITOT 0.54 08/09/2016 0852   GFRNONAA >90 01/06/2014 0910   GFRAA >90 01/06/2014 0910    INo results found for: SPEP, UPEP  Lab Results  Component Value Date   WBC 3.4 (L) 08/15/2016   NEUTROABS 1.7 08/15/2016   HGB 13.2 08/15/2016   HCT 38.7 08/15/2016   MCV 98.1 08/15/2016   PLT 185 08/15/2016      Chemistry      Component Value Date/Time   NA 140 08/09/2016 0852   K 4.7 08/09/2016 0852   CL 104 01/06/2014 0910   CO2 28 08/09/2016 0852   BUN 15.3 08/09/2016 0852   CREATININE 0.8 08/09/2016 0852      Component Value Date/Time   CALCIUM 9.4 08/09/2016 0852   ALKPHOS 61 08/09/2016 0852   AST 19 08/09/2016 0852   ALT 24 08/09/2016 0852   BILITOT 0.54 08/09/2016 0852       Lab Results  Component Value Date   LABCA2 25 08/30/2015    No components found for: HCWCB762  No results for input(s): INR in the last 168 hours.  Urinalysis    Component Value Date/Time   COLORURINE YELLOW 11/18/2013 1321   APPEARANCEUR CLEAR 11/18/2013 1321   LABSPEC 1.008 11/18/2013 1321   PHURINE 7.5 11/18/2013 1321   GLUCOSEU NEGATIVE 11/18/2013 1321   HGBUR NEGATIVE 11/18/2013 1321   BILIRUBINUR neg 02/16/2015 1318   KETONESUR NEGATIVE 11/18/2013 1321   PROTEINUR neg 02/16/2015 1318   PROTEINUR NEGATIVE 11/18/2013 1321   UROBILINOGEN 0.2 02/16/2015 1318   UROBILINOGEN 0.2 11/18/2013 1321   NITRITE neg 02/16/2015 1318   NITRITE NEGATIVE 11/18/2013 1321   LEUKOCYTESUR  Negative 02/16/2015 1318     STUDIES:  No results found.   ELIGIBLE FOR AVAILABLE RESEARCH PROTOCOL: no  ASSESSMENT: 76 y.o. [place] woman  (1) status post right breast upper outer quadrant lumpectomy and axillary lymph node dissection in 2007 for what appears to have been a T1 N0, stage IA invasive ductal carcinoma, treated adjuvantly with radiation (33 sessions)  METASTATIC DISEASE definitively documented Sept 2015 (2) bronchoscopic biopsy 01/07/2014 showed a low-grade mucinous breast cancer, strongly estrogen receptor positive, progesterone receptor positive, HER-2 negative; staging studies confirmed extensive hypermetabolic adenopathy, multiple bone lesions, likely early lung involvement, but no liver lesions.  (a) bone scan and chest CT 07/09/2016 shows stable disease.  (3) on Arimidex between September 2015 and June 2017, with evidence of response; discontinued secondary to side effects  (a) bone density 04/10/2016 shows osteopenia with a T score of -2.1  (4) on monthly denosumab/Xgeva between 07/30/2014 and 10/04/2015, discontinued due to patient's concerns regarding osteonecrosis of the jaw  (a) discussed again October 2017, the patient adamantly refusing denosumab  (5) started fulvestrant 01/26/2016   (a) Palbociclib added at 75 mg QOD, 21/7, beginning mid February 2018  PLAN Kynlei is clinically very stable and she is tolerating her treatments well. We reviewed how to take her palbociclib, as she seemed a little bit confused, but basically they count the pills and then when she runs out of they wait a week before restarting.  We discussed when to proceed to restaging. I think the best time would be 6 months from the last set of scans. She is very comfortable with that plan. Certainly  if there were any new symptoms to evaluate we could move that schedule up as needed.  Otherwise I'm making no changes in her treatment. She will receive her next dose of fulvestrant in May  and see me with the June dose of treatment. She knows to call for any problems that may develop before her next visit here.      :Chauncey Cruel, MD   08/15/2016 12:39 PM Medical Oncology and Hematology [facility] [place], [place] [address] Tel. [phone]    Fax. [phone]

## 2016-08-22 ENCOUNTER — Ambulatory Visit (INDEPENDENT_AMBULATORY_CARE_PROVIDER_SITE_OTHER): Payer: Medicare Other | Admitting: Internal Medicine

## 2016-08-22 ENCOUNTER — Encounter: Payer: Self-pay | Admitting: Internal Medicine

## 2016-08-22 VITALS — BP 124/72 | HR 70 | Temp 97.6°F | Ht 62.0 in | Wt 125.0 lb

## 2016-08-22 DIAGNOSIS — J301 Allergic rhinitis due to pollen: Secondary | ICD-10-CM

## 2016-08-22 DIAGNOSIS — I10 Essential (primary) hypertension: Secondary | ICD-10-CM

## 2016-08-22 DIAGNOSIS — Z0000 Encounter for general adult medical examination without abnormal findings: Secondary | ICD-10-CM | POA: Diagnosis not present

## 2016-08-22 MED ORDER — DICYCLOMINE HCL 10 MG PO CAPS: 10.0000 mg | ORAL_CAPSULE | Freq: Three times a day (TID) | ORAL | 2 refills | Status: DC

## 2016-08-22 MED ORDER — ALPRAZOLAM 0.25 MG PO TABS: 0.2500 mg | ORAL_TABLET | Freq: Three times a day (TID) | ORAL | 0 refills | Status: DC | PRN

## 2016-08-22 NOTE — Progress Notes (Signed)
Subjective:    Patient ID: Erica Young, female    DOB: 1940/11/18, 76 y.o.   MRN: [id number]  HPI  76 year old patient who is followed closely by oncology due to metastatic breast cancer.  She was seen by oncology last week with lab She is seen here today for her six-month follow-up with hypertension  She is doing fairly well.  This has some minor allergy related symptoms but in general doing well  Past Medical History:  Diagnosis Date  . Allergy   . Anxiety   . Bone cancer ([place]) mri 11/11/14   right parietal bone,left cribriform plate metastases  . Cancer [facility]) 2007   right breat ca  , lumpectomy and radiation tx (declined chemo and additional prophylactic meds)  . CEREBROVASCULAR DISEASE 03/03/2010  . Diverticulitis   . DIVERTICULOSIS, COLON 03/03/2010  . Fatty liver 08/02/09   as per U/S done by [facility]  . Foramen ovale    positive bubble study  . Headache(784.0)    she thinks sinus headaches  . History of cerebral artery stenosis    right middle  . HYPERLIPIDEMIA 03/03/2010  . Hypertension   . HYPOTHYROIDISM 03/03/2010   no longer on meds  . Internal hemorrhoid   . Low back pain   . Mastoiditis    noted on brain MRI  . Rib fracture   . Right leg pain   . Shortness of breath   . Stroke [facility]) Oct. 2011   TIA  . Ulcer      Social History   Social History  . Marital status: Married    Spouse name: N/A  . Number of children: 2  . Years of education: N/A   Occupational History  . Social Worker--Retired    Social History Main Topics  . Smoking status: Former Smoker    Packs/day: 0.50    Years: 20.00    Types: Cigarettes    Quit date: 05/01/1991  . Smokeless tobacco: Never Used  . Alcohol use No  . Drug use: No  . Sexual activity: Yes   Other Topics Concern  . Not on file   Social History Narrative   Daily caffeine     Past Surgical History:  Procedure Laterality Date  . BREAST LUMPECTOMY     right  . CHOLECYSTECTOMY    . COLONOSCOPY     . SHOULDER SURGERY     right shoulder (dislocation)  . TONSILLECTOMY    . TUBAL LIGATION    . VIDEO BRONCHOSCOPY WITH ENDOBRONCHIAL ULTRASOUND N/A 01/07/2014   Procedure: VIDEO BRONCHOSCOPY WITH ENDOBRONCHIAL ULTRASOUND;  Surgeon: Ivin Poot, MD;  Location: [facility] OR;  Service: Thoracic;  Laterality: N/A;    Family History  Problem Relation Age of Onset  . Heart disease Sister     cerebral vascular disease also  . Heart disease Brother     cerebral vascular disease also  . Heart disease Brother     cerebral vascular disease  . Heart disease Sister     cebreal vascular disease also  . Heart disease Sister     cebreal vascular diease also  . Heart disease Sister     cerebral vascular diease also  . Stomach cancer Father   . Colon cancer Neg Hx   . Esophageal cancer Neg Hx   . Rectal cancer Neg Hx     Allergies  Allergen Reactions  . Ciprofloxacin Anaphylaxis    Current Outpatient Prescriptions on File Prior to Visit  Medication Sig  Dispense Refill  . ALPRAZolam (XANAX) 0.25 MG tablet TAKE 1 TABLET BY MOUTH THREE TIMES DAILY AS NEEDED FOR ANXIETY 90 tablet 0  . aspirin EC 81 MG tablet Take 81 mg by mouth daily.    Marland Kitchen CARTIA XT 120 MG 24 hr capsule TAKE 1 CAPSULE BY MOUTH DAILY 90 capsule 0  . Cholecalciferol (VITAMIN D PO) Take 1,000 Units by mouth 2 (two) times daily.    Marland Kitchen docusate sodium (COLACE) 100 MG capsule Take 100 mg by mouth daily as needed for mild constipation.    . Flaxseed, Linseed, (FLAX SEEDS PO) Take by mouth daily.    . Fulvestrant (FASLODEX IM) Inject into the muscle.    . Glucosamine 500 MG CAPS Take by mouth.    . meclizine (ANTIVERT) 25 MG tablet TAKE 1 TABLET(25 MG) BY MOUTH EVERY 6 HOURS AS NEEDED FOR DIZZINESS 60 tablet 0  . Multiple Vitamins-Minerals (MULTIVITAMIN PO) Take by mouth daily.    Marland Kitchen omeprazole (PRILOSEC) 20 MG capsule Take 1 capsule (20 mg total) by mouth daily. 90 capsule 4  . palbociclib (IBRANCE) 75 MG capsule Take 1 capsule (75 mg  total) by mouth daily with breakfast. Take whole with food. 21 capsule 6  . Probiotic Product (PROBIOTIC DAILY PO) Take by mouth daily.    . simvastatin (ZOCOR) 5 MG tablet TAKE 1 TABLET BY MOUTH DAILY 90 tablet 1  . simvastatin (ZOCOR) 5 MG tablet TAKE 1 TABLET BY MOUTH DAILY 90 tablet 1   No current facility-administered medications on file prior to visit.     BP 124/72 (BP Location: Left Arm, Patient Position: Sitting, Cuff Size: Normal)   Pulse 70   Temp 97.6 F (36.4 C) (Oral)   Ht '5\' 2"'$  (1.575 m)   Wt 125 lb (56.7 kg)   SpO2 98%   BMI 22.86 kg/m     Review of Systems  Constitutional: Negative.   HENT: Positive for congestion and postnasal drip. Negative for dental problem, hearing loss, rhinorrhea, sinus pressure, sore throat and tinnitus.   Eyes: Negative for pain, discharge and visual disturbance.  Respiratory: Negative for cough and shortness of breath.   Cardiovascular: Negative for chest pain, palpitations and leg swelling.  Gastrointestinal: Negative for abdominal distention, abdominal pain, blood in stool, constipation, diarrhea, nausea and vomiting.  Genitourinary: Negative for difficulty urinating, dysuria, flank pain, frequency, hematuria, pelvic pain, urgency, vaginal bleeding, vaginal discharge and vaginal pain.  Musculoskeletal: Negative for arthralgias, gait problem and joint swelling.  Skin: Negative for rash.  Neurological: Negative for dizziness, syncope, speech difficulty, weakness, numbness and headaches.  Hematological: Negative for adenopathy.  Psychiatric/Behavioral: Negative for agitation, behavioral problems and dysphoric mood. The patient is nervous/anxious.        Objective:   Physical Exam  Constitutional: She is oriented to person, place, and time. She appears well-developed and well-nourished.  HENT:  Head: Normocephalic.  Right Ear: External ear normal.  Left Ear: External ear normal.  Mouth/Throat: Oropharynx is clear and moist.  Eyes:  Conjunctivae and EOM are normal. Pupils are equal, round, and reactive to light.  Neck: Normal range of motion. Neck supple. No thyromegaly present.  Cardiovascular: Normal rate, regular rhythm, normal heart sounds and intact distal pulses.   Pulmonary/Chest: Effort normal and breath sounds normal.  Abdominal: Soft. Bowel sounds are normal. She exhibits no mass. There is no tenderness.  Musculoskeletal: Normal range of motion.  Lymphadenopathy:    She has no cervical adenopathy.  Neurological: She is alert and oriented  to person, place, and time.  Skin: Skin is warm and dry. No rash noted.  Psychiatric: She has a normal mood and affect. Her behavior is normal.          Assessment & Plan:   Essential hypertension, well-controlled Metastatic breast cancer.  Follow-up oncology  Follow-up here 6 months  Janavia Rottman Pilar Plate

## 2016-08-22 NOTE — Progress Notes (Signed)
Pre visit review using our clinic review tool, if applicable. No additional management support is needed unless otherwise documented below in the visit note. 

## 2016-08-22 NOTE — Patient Instructions (Addendum)
WE NOW OFFER   Erica Young's FAST TRACK!!!  SAME DAY Appointments for ACUTE CARE  Such as: Sprains, Injuries, cuts, abrasions, rashes, muscle pain, joint pain, back pain Colds, flu, sore throats, headache, allergies, cough, fever  Ear pain, sinus and eye infections Abdominal pain, nausea, vomiting, diarrhea, upset stomach Animal/insect bites  3 Easy Ways to Schedule: Walk-In Scheduling Call in scheduling Mychart Sign-up: [email or web address]   Oncology follow-up as scheduled  Limit your sodium (Salt) intake  Please check your blood pressure on a regular basis.  If it is consistently greater than 150/90, please make an office appointment.  Return in 6 months for follow-up

## 2016-08-30 DIAGNOSIS — L03113 Cellulitis of right upper limb: Secondary | ICD-10-CM | POA: Diagnosis not present

## 2016-09-02 DIAGNOSIS — L03113 Cellulitis of right upper limb: Secondary | ICD-10-CM | POA: Diagnosis not present

## 2016-09-06 ENCOUNTER — Other Ambulatory Visit (HOSPITAL_BASED_OUTPATIENT_CLINIC_OR_DEPARTMENT_OTHER): Payer: Medicare Other

## 2016-09-06 ENCOUNTER — Ambulatory Visit (HOSPITAL_BASED_OUTPATIENT_CLINIC_OR_DEPARTMENT_OTHER): Payer: Medicare Other

## 2016-09-06 VITALS — BP 134/74 | HR 70 | Temp 97.6°F | Resp 20

## 2016-09-06 DIAGNOSIS — C50411 Malignant neoplasm of upper-outer quadrant of right female breast: Secondary | ICD-10-CM | POA: Diagnosis not present

## 2016-09-06 DIAGNOSIS — Z5111 Encounter for antineoplastic chemotherapy: Secondary | ICD-10-CM | POA: Diagnosis present

## 2016-09-06 DIAGNOSIS — C50911 Malignant neoplasm of unspecified site of right female breast: Secondary | ICD-10-CM | POA: Diagnosis not present

## 2016-09-06 DIAGNOSIS — C7951 Secondary malignant neoplasm of bone: Secondary | ICD-10-CM

## 2016-09-06 DIAGNOSIS — Z853 Personal history of malignant neoplasm of breast: Secondary | ICD-10-CM

## 2016-09-06 LAB — CBC WITH DIFFERENTIAL/PLATELET
BASO%: 1.2 % (ref 0.0–2.0)
BASOS ABS: 0.1 10*3/uL (ref 0.0–0.1)
EOS%: 4.7 % (ref 0.0–7.0)
Eosinophils Absolute: 0.2 10*3/uL (ref 0.0–0.5)
HCT: 39.4 % (ref 34.8–46.6)
HGB: 13.3 g/dL (ref 11.6–15.9)
LYMPH%: 42.9 % (ref 14.0–49.7)
MCH: 33.8 pg (ref 25.1–34.0)
MCHC: 33.8 g/dL (ref 31.5–36.0)
MCV: 100.3 fL (ref 79.5–101.0)
MONO#: 0.3 10*3/uL (ref 0.1–0.9)
MONO%: 6.1 % (ref 0.0–14.0)
NEUT#: 1.9 10*3/uL (ref 1.5–6.5)
NEUT%: 45.1 % (ref 38.4–76.8)
PLATELETS: 146 10*3/uL (ref 145–400)
RBC: 3.93 10*6/uL (ref 3.70–5.45)
RDW: 14.4 % (ref 11.2–14.5)
WBC: 4.3 10*3/uL (ref 3.9–10.3)
lymph#: 1.8 10*3/uL (ref 0.9–3.3)

## 2016-09-06 LAB — COMPREHENSIVE METABOLIC PANEL
ALBUMIN: 3.9 g/dL (ref 3.5–5.0)
ALK PHOS: 68 U/L (ref 40–150)
ALT: 15 U/L (ref 0–55)
AST: 16 U/L (ref 5–34)
Anion Gap: 8 mEq/L (ref 3–11)
BUN: 12.6 mg/dL (ref 7.0–26.0)
CO2: 28 mEq/L (ref 22–29)
Calcium: 9.5 mg/dL (ref 8.4–10.4)
Chloride: 104 mEq/L (ref 98–109)
Creatinine: 0.8 mg/dL (ref 0.6–1.1)
EGFR: 68 mL/min/{1.73_m2} — ABNORMAL LOW (ref >90)
GLUCOSE: 96 mg/dL (ref 70–140)
POTASSIUM: 4.3 meq/L (ref 3.5–5.1)
SODIUM: 140 meq/L (ref 136–145)
TOTAL PROTEIN: 7.1 g/dL (ref 6.4–8.3)
Total Bilirubin: 0.55 mg/dL (ref 0.20–1.20)

## 2016-09-06 MED ORDER — FULVESTRANT 250 MG/5ML IM SOLN
500.0000 mg | Freq: Once | INTRAMUSCULAR | Status: AC
  Administered 2016-09-06: 500 mg via INTRAMUSCULAR
  Filled 2016-09-06: qty 10

## 2016-09-06 NOTE — Patient Instructions (Signed)

## 2016-09-07 LAB — CANCER ANTIGEN 27.29: CA 27.29: 58.6 U/mL — ABNORMAL HIGH (ref 0.0–38.6)

## 2016-10-01 ENCOUNTER — Other Ambulatory Visit: Payer: Self-pay | Admitting: Internal Medicine

## 2016-10-02 ENCOUNTER — Telehealth: Payer: Self-pay | Admitting: Internal Medicine

## 2016-10-02 NOTE — Telephone Encounter (Signed)
Yes thanks, 30 days only

## 2016-10-02 NOTE — Telephone Encounter (Signed)
Pt is out of alprazolam and needs a refill on today. [facility] [address]

## 2016-10-02 NOTE — Telephone Encounter (Signed)
Last office visit and refill 08/22/16.  Okay to fill?

## 2016-10-03 MED ORDER — ALPRAZOLAM 0.25 MG PO TABS: 0.2500 mg | ORAL_TABLET | Freq: Three times a day (TID) | ORAL | 0 refills | Status: DC | PRN

## 2016-10-03 NOTE — Telephone Encounter (Signed)
Rx called in 

## 2016-10-04 ENCOUNTER — Ambulatory Visit (HOSPITAL_BASED_OUTPATIENT_CLINIC_OR_DEPARTMENT_OTHER): Payer: Medicare Other | Admitting: Oncology

## 2016-10-04 ENCOUNTER — Other Ambulatory Visit (HOSPITAL_BASED_OUTPATIENT_CLINIC_OR_DEPARTMENT_OTHER): Payer: Medicare Other

## 2016-10-04 ENCOUNTER — Ambulatory Visit (HOSPITAL_BASED_OUTPATIENT_CLINIC_OR_DEPARTMENT_OTHER): Payer: Medicare Other

## 2016-10-04 VITALS — BP 147/63 | HR 62 | Temp 97.4°F | Resp 18 | Ht 62.0 in | Wt 127.1 lb

## 2016-10-04 DIAGNOSIS — R143 Flatulence: Secondary | ICD-10-CM

## 2016-10-04 DIAGNOSIS — C50411 Malignant neoplasm of upper-outer quadrant of right female breast: Secondary | ICD-10-CM

## 2016-10-04 DIAGNOSIS — L659 Nonscarring hair loss, unspecified: Secondary | ICD-10-CM | POA: Diagnosis not present

## 2016-10-04 DIAGNOSIS — C7951 Secondary malignant neoplasm of bone: Secondary | ICD-10-CM

## 2016-10-04 DIAGNOSIS — Z853 Personal history of malignant neoplasm of breast: Secondary | ICD-10-CM

## 2016-10-04 DIAGNOSIS — Z170 Estrogen receptor positive status [ER+]: Secondary | ICD-10-CM

## 2016-10-04 DIAGNOSIS — R252 Cramp and spasm: Secondary | ICD-10-CM | POA: Diagnosis not present

## 2016-10-04 DIAGNOSIS — Z5111 Encounter for antineoplastic chemotherapy: Secondary | ICD-10-CM

## 2016-10-04 DIAGNOSIS — C50911 Malignant neoplasm of unspecified site of right female breast: Secondary | ICD-10-CM | POA: Diagnosis not present

## 2016-10-04 LAB — CBC WITH DIFFERENTIAL/PLATELET
BASO%: 1 % (ref 0.0–2.0)
BASOS ABS: 0 10*3/uL (ref 0.0–0.1)
EOS%: 4 % (ref 0.0–7.0)
Eosinophils Absolute: 0.1 10*3/uL (ref 0.0–0.5)
HEMATOCRIT: 38.1 % (ref 34.8–46.6)
HGB: 13 g/dL (ref 11.6–15.9)
LYMPH#: 1.4 10*3/uL (ref 0.9–3.3)
LYMPH%: 46.1 % (ref 14.0–49.7)
MCH: 34.8 pg — AB (ref 25.1–34.0)
MCHC: 34.1 g/dL (ref 31.5–36.0)
MCV: 101.9 fL — ABNORMAL HIGH (ref 79.5–101.0)
MONO#: 0.2 10*3/uL (ref 0.1–0.9)
MONO%: 6.1 % (ref 0.0–14.0)
NEUT#: 1.3 10*3/uL — ABNORMAL LOW (ref 1.5–6.5)
NEUT%: 42.8 % (ref 38.4–76.8)
Platelets: 146 10*3/uL (ref 145–400)
RBC: 3.74 10*6/uL (ref 3.70–5.45)
RDW: 14.1 % (ref 11.2–14.5)
WBC: 3 10*3/uL — ABNORMAL LOW (ref 3.9–10.3)

## 2016-10-04 LAB — COMPREHENSIVE METABOLIC PANEL
ALT: 13 U/L (ref 0–55)
ANION GAP: 8 meq/L (ref 3–11)
AST: 16 U/L (ref 5–34)
Albumin: 3.9 g/dL (ref 3.5–5.0)
Alkaline Phosphatase: 62 U/L (ref 40–150)
BILIRUBIN TOTAL: 0.44 mg/dL (ref 0.20–1.20)
BUN: 10.4 mg/dL (ref 7.0–26.0)
CALCIUM: 9.7 mg/dL (ref 8.4–10.4)
CO2: 31 mEq/L — ABNORMAL HIGH (ref 22–29)
Chloride: 102 mEq/L (ref 98–109)
Creatinine: 0.8 mg/dL (ref 0.6–1.1)
EGFR: 68 mL/min/{1.73_m2} — AB (ref >90)
GLUCOSE: 116 mg/dL (ref 70–140)
Potassium: 4.5 mEq/L (ref 3.5–5.1)
Sodium: 141 mEq/L (ref 136–145)
TOTAL PROTEIN: 7 g/dL (ref 6.4–8.3)

## 2016-10-04 MED ORDER — PALBOCICLIB 75 MG PO CAPS: 75.0000 mg | ORAL_CAPSULE | Freq: Every day | ORAL | 6 refills | Status: DC

## 2016-10-04 MED ORDER — FULVESTRANT 250 MG/5ML IM SOLN
500.0000 mg | Freq: Once | INTRAMUSCULAR | Status: AC
  Administered 2016-10-04: 500 mg via INTRAMUSCULAR
  Filled 2016-10-04: qty 10

## 2016-10-04 MED FILL — IBRANCE 75 MG CAPSULE: 75 | 28 days supply | Qty: 21 | Fill #0

## 2016-10-04 NOTE — Patient Instructions (Signed)

## 2016-10-04 NOTE — Progress Notes (Signed)
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 She continues on fulvestrant, generally with good tolerance. The shots themselves are not a problem for her.  She is also on palbociclib, which she takes Monday Wednesday and Friday 3 weeks on and one-week off. She tolerated with no side effects at this dose.  Her most recent imaging was mid-March 2018. Her most recent CA-27-29 was stable at 58.6   REVIEW OF SYSTEMS: Shajuan has lost a little bit of hair in this is of course of concern to her. She has continuing problems with gas. We tried simethicone but that did not work. She has been given dicyclomine and is willing to give that a try. She says she needs cataract surgery. She understands I don't have a problem with that. She got scratched in her right hand by feral cat and seems to have had quite a significant infection which was treated by her primary care physician with antibiotics, successfully. She has significant allergy symptoms. She feels anxious, but not depressed. A detailed review of systems today was otherwise stable  BREAST CANCER HISTORY: From the earlier summary note  Erica Young tells me in 2007 while living in [place] she was found to have a very  small cancer in the upper-outer quadrant of the right breast, about the size of the P, noted on mammography. It was not palpable. She had a right lumpectomy and full sentinel lymph node sampling. She then received adjuvant radiation, to a total of 33 treatments. She received no systemic therapy.  She then did well until December 2014, when she had a fall and complain of pain in her left ribs. Rib films and chest x-ray on 76/15/2014 showed no fractures, but CT of the abdomen and pelvis on the same day found subcarinal and right hilar adenopathy. This was followed up with a chest CT scan 76/09/2013 confirming extensive mediastinal and right hilar lymphadenopathy, with multiple pulmonary nodules, the largest measuring 0.9 cm. PET scan 76/22/2015 showed hypermetabolic adenopathy in the right and left paratracheal areas, the precarinal, subcarinal and right hilar areas, but no involvement of the liver, and the lung nodules were not hypermetabolic (although they were below the level of reliable detection). In addition, at L5 there was a lucency measuring 1.2 cm.  The PET scan also showed a hypermetabolic focus on the thyroid gland, which was evaluated with neck ultrasound and biopsy 76/74/9449, showing a follicular lesion of undetermined significance (([id number] [id number]).  The patient was referred to pulmonary [Dr Melvyn Novas and Dr Julien Nordmann for further evaluation. Repeat PET scan 76/31/2015 showed, in addition to the adenopathy previously noted, now multiple bony metastases. On 76/01/2014 the patient underwent bronchoscopy and this showed (SZA [id number], together with separate cytology] a low-grade mucinous invasive breast cancer, estrogen receptor 100% positive, progesterone receptor 14% positive, with no HER-2 amplification, the signals ratio being 1.30 and the  number per cell 1.95.  The patient was started on anastrozole 01/22/2014; monthly denosumab/Xgeva was added 76/04/2014. She appeared to tolerate this well, and her  CA-27-29 (127 at baseline) normalized. Most recent CT scans of chest abdomen and pelvis 76/08/2015 showed continuing response. Despite this good news however, the patient decided to go off anastrozole and denosumab/Xgeva as of 76 2017. By the time she saw Dr. Earlie Server again in 76 2017 her tumor marker had doubled. At that time the patient was referred to the breast clinic for a second opinion regarding further evaluation and treatment.   PAST MEDICAL HISTORY: Past Medical History:  Diagnosis Date  . Allergy   . Anxiety   . Bone cancer ([place]) mri 11/11/14   right parietal bone,left cribriform plate metastases  . Cancer [facility]) 2007   right breat ca  , lumpectomy and radiation tx (declined chemo and additional prophylactic meds)  . CEREBROVASCULAR DISEASE 76/07/2009  . Diverticulitis   . DIVERTICULOSIS, COLON 76/07/2009  . Fatty liver 08/02/09   as per U/S done by [facility]  . Foramen ovale    positive bubble study  . Headache(784.0)    she thinks sinus headaches  . History of cerebral artery stenosis    right middle  . HYPERLIPIDEMIA 76/07/2009  . Hypertension   . HYPOTHYROIDISM 76/07/2009   no longer on meds  . Internal hemorrhoid   . Low back pain   . Mastoiditis    noted on brain MRI  . Rib fracture   . Right leg pain   . Shortness of breath   . Stroke [facility]) Oct. 2011   TIA  . Ulcer     PAST SURGICAL HISTORY: Past Surgical History:  Procedure Laterality Date  . BREAST LUMPECTOMY     right  . CHOLECYSTECTOMY    . COLONOSCOPY    . SHOULDER SURGERY     right shoulder (dislocation)  . TONSILLECTOMY    . TUBAL LIGATION    . VIDEO BRONCHOSCOPY WITH ENDOBRONCHIAL ULTRASOUND N/A 01/07/2014   Procedure: VIDEO BRONCHOSCOPY WITH ENDOBRONCHIAL ULTRASOUND;  Surgeon: Ivin Poot, MD;  Location: [facility] OR;  Service: Thoracic;  Laterality: N/A;    FAMILY HISTORY Family History  Problem Relation Age of Onset  . Heart disease Sister        cerebral vascular  disease also  . Heart disease Brother        cerebral vascular disease also  . Heart disease Brother        cerebral vascular disease  . Heart disease Sister        cebreal vascular disease also  . Heart disease Sister        cebreal vascular diease also  . Heart disease Sister        cerebral vascular diease also  . Stomach cancer Father   . Colon cancer Neg Hx   . Esophageal cancer Neg Hx   . Rectal cancer Neg Hx   The patient's father died from stomach cancer in his late 36s. The patient's mother died in her 14s from a stroke. The patient has 2 brothers, 4 sisters. One sister had leukemia. There is no history of breast, colon, or ovarian cancer in the family to her knowledge  GYNECOLOGIC HISTORY:  No LMP recorded. Patient is postmenopausal. Menarche age 16, first live birth age 13, the patient is [place] P2. She went through menopause in her late 33s. She took no hormone replacement. She took oral contraceptives for approximately 2 years  remotely without complications.  SOCIAL HISTORY:  Montzerrat is originally from [place]. 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 Her husband Mortimer Fries is a retired substance abuse Social worker. This is a second marriage for both of them. Derya has 2 children from her first marriage, [name], lives in [place], and worked as a Audiological scientist but is now disabled, and Lawrence Santiago, who lives in [place] and works in Engineer, mining. The patient has 6 grandchildren. She attends [employer]Mikki Santee has 3 children from his first marriage. [name] Junior lives in [place] and has his own trucking business. He tells me he is estranged from his 2 daughters Amedeo Gory (a retired Pharmacist, hospital) and Salena Saner (who works in a bank). They living [place].    ADVANCED DIRECTIVES: in place   HEALTH MAINTENANCE: Social History  Substance Use Topics  . Smoking status: Former Smoker    Packs/day: 0.50    Years: 20.00     Types: Cigarettes    Quit date: 05/01/1991  . Smokeless tobacco: Never Used  . Alcohol use No     Colonoscopy:  PAP:  Bone density:   Allergies  Allergen Reactions  . Ciprofloxacin Anaphylaxis    Current Outpatient Prescriptions  Medication Sig Dispense Refill  . ALPRAZolam (XANAX) 0.25 MG tablet Take 1 tablet (0.25 mg total) by mouth 3 (three) times daily as needed. for anxiety 90 tablet 0  . aspirin EC 81 MG tablet Take 81 mg by mouth daily.    Marland Kitchen CARTIA XT 120 MG 24 hr capsule TAKE 1 CAPSULE BY MOUTH DAILY 90 capsule 0  . Cholecalciferol (VITAMIN D PO) Take 1,000 Units by mouth 2 (two) times daily.    Marland Kitchen dicyclomine (BENTYL) 10 MG capsule Take 1 capsule (10 mg total) by mouth 4 (four) times daily -  before meals and at bedtime. 60 capsule 2  . docusate sodium (COLACE) 100 MG capsule Take 100 mg by mouth daily as needed for mild constipation.    . Flaxseed, Linseed, (FLAX SEEDS PO) Take by mouth daily.    . Fulvestrant (FASLODEX IM) Inject into the muscle.    . Glucosamine 500 MG CAPS Take by mouth.    . meclizine (ANTIVERT) 25 MG tablet TAKE 1 TABLET(25 MG) BY MOUTH EVERY 6 HOURS AS NEEDED FOR DIZZINESS 60 tablet 0  . Multiple Vitamins-Minerals (MULTIVITAMIN PO) Take by mouth daily.    Marland Kitchen omeprazole (PRILOSEC) 20 MG capsule Take 1 capsule (20 mg total) by mouth daily. 90 capsule 4  . palbociclib (IBRANCE) 75 MG capsule Take 1 capsule (75 mg total) by mouth daily with breakfast. Take whole with food. 21 capsule 6  . Probiotic Product (PROBIOTIC DAILY PO) Take by mouth daily.    . simvastatin (ZOCOR) 5 MG tablet TAKE 1 TABLET BY MOUTH DAILY 90 tablet 1  . simvastatin (ZOCOR) 5 MG tablet TAKE 1 TABLET BY MOUTH DAILY 90 tablet 1   No current facility-administered medications for this visit.    Facility-Administered Medications Ordered in Other Visits  Medication Dose Route Frequency Provider Last Rate Last Dose  . fulvestrant (FASLODEX) injection 500 mg  500 mg Intramuscular Once  Magrinat, Virgie Dad, MD        OBJECTIVE: Older white woman in no acute distress  Vitals:   10/04/16 1216  BP: (!) 147/63  Pulse: 62  Resp: 18  Temp: 97.4 F (36.3 C)     Body mass index is 23.25 kg/m.  ECOG FS:1 - Symptomatic but completely ambulatory  Sclerae unicteric, EOMs intact Oropharynx clear and moist No cervical or supraclavicular adenopathy Lungs no rales or rhonchi Heart regular rate and rhythm Abd soft, nontender, positive bowel sounds MSK no focal spinal tenderness, no upper extremity lymphedema Neuro: nonfocal, well oriented, appropriate affect Breasts: The right breast is status post lumpectomy and radiation with no evidence of local recurrence. The left breast is unremarkable. Both axillae are benign.    LAB RESULTS:  CMP     Component Value Date/Time   NA 141 10/04/2016 1146   K 4.5 10/04/2016 1146   CL 104 01/06/2014 0910   CO2 31 (H) 10/04/2016 1146   GLUCOSE 116 10/04/2016 1146   BUN 10.4 10/04/2016 1146   CREATININE 0.8 10/04/2016 1146   CALCIUM 9.7 10/04/2016 1146   PROT 7.0 10/04/2016 1146   ALBUMIN 3.9 10/04/2016 1146   AST 16 10/04/2016 1146   ALT 13 10/04/2016 1146   ALKPHOS 62 10/04/2016 1146   BILITOT 0.44 10/04/2016 1146   GFRNONAA >90 01/06/2014 0910   GFRAA >90 01/06/2014 0910    INo results found for: SPEP, UPEP  Lab Results  Component Value Date   WBC 3.0 (L) 10/04/2016   NEUTROABS 1.3 (L) 10/04/2016   HGB 13.0 10/04/2016   HCT 38.1 10/04/2016   MCV 101.9 (H) 10/04/2016   PLT 146 10/04/2016      Chemistry      Component Value Date/Time   NA 141 10/04/2016 1146   K 4.5 10/04/2016 1146   CL 104 01/06/2014 0910   CO2 31 (H) 10/04/2016 1146   BUN 10.4 10/04/2016 1146   CREATININE 0.8 10/04/2016 1146      Component Value Date/Time   CALCIUM 9.7 10/04/2016 1146   ALKPHOS 62 10/04/2016 1146   AST 16 10/04/2016 1146   ALT 13 10/04/2016 1146   BILITOT 0.44 10/04/2016 1146       Lab Results  Component Value  Date   LABCA2 25 08/30/2015    No components found for: VHQIO962  No results for input(s): INR in the last 168 hours.  Urinalysis    Component Value Date/Time   COLORURINE YELLOW 11/18/2013 1321   APPEARANCEUR CLEAR 11/18/2013 1321   LABSPEC 1.008 11/18/2013 1321   PHURINE 7.5 11/18/2013 1321   GLUCOSEU NEGATIVE 11/18/2013 1321   HGBUR NEGATIVE 11/18/2013 1321   BILIRUBINUR neg 02/16/2015 1318   KETONESUR NEGATIVE 11/18/2013 1321   PROTEINUR neg 02/16/2015 1318   PROTEINUR NEGATIVE 11/18/2013 1321   UROBILINOGEN 0.2 02/16/2015 1318   UROBILINOGEN 0.2 11/18/2013 1321   NITRITE neg 02/16/2015 1318   NITRITE NEGATIVE 11/18/2013 1321   LEUKOCYTESUR Negative 02/16/2015 1318     STUDIES: Bone scan 07/09/2016 was stable  ELIGIBLE FOR AVAILABLE RESEARCH PROTOCOL: no  ASSESSMENT: 76 y.o. [place] woman  (1) status post right breast upper outer quadrant lumpectomy and axillary lymph node dissection in 2007 for what appears to have been a T1 N0, stage IA invasive ductal carcinoma, treated adjuvantly with radiation (33 sessions)  METASTATIC DISEASE definitively documented Sept 2015 (2) bronchoscopic biopsy 76/01/2014 showed a low-grade mucinous breast cancer, strongly estrogen receptor positive, progesterone receptor positive, HER-2 negative; staging studies confirmed extensive hypermetabolic adenopathy, multiple bone lesions, likely early lung involvement, but no liver lesions.  (a) bone scan and chest CT 07/09/2016 shows stable disease.  (3) on Arimidex between September 2015 and 76 2017, with evidence of response; discontinued secondary to side effects  (a) bone density 04/10/2016 shows  osteopenia with a T score of -2.1  (4) on monthly denosumab/Xgeva between 76/04/2014 and 10/04/2015, discontinued due to patient's concerns regarding osteonecrosis of the jaw  (a) discussed again October 2017, the patient adamantly refusing denosumab  (5) started fulvestrant 01/26/2016    (a) Palbociclib added at 75 mg M/W/F, beginning mid February 2018  PLAN I spent approximately 30 minutes with Deyonna with most of that time spent discussing her complex problems. From a breast cancer point of view she is actually doing remarkably well and the plan is to continue the current treatment.  She is taking the palbociclib in an unusual way, basically every Monday Wednesday and Friday 3 weeks on and then 1 week off. This is working for her.  They get very confused regarding billing for this. I am letting our oral chemotherapy pharmacist note to shepherd them a little bit as they get concerned regarding Bill's and payment issues from this very expensive drug, which however she has been receiving essentially for free Hollie Salk  She continues to have problems with gas. I think this is can it be dietary. We discussed that at length.  She also has some cramps sometimes in the morning. Possibly she has low calcium but she stopped her calcium supplementations because they contributed to her constipation issue  Overall I think she is doing well. She will see me again in 4 weeks. She knows to call for any problems that may develop before that visit.   Chauncey Cruel  10/04/2016 Medical Oncology and Hematology [facility] [address] [place], [place] [address] Tel. [phone]    Fax. [phone]

## 2016-10-05 LAB — CANCER ANTIGEN 27.29: CAN 27.29: 59.8 U/mL — AB (ref 0.0–38.6)

## 2016-10-12 ENCOUNTER — Other Ambulatory Visit: Payer: Self-pay | Admitting: Internal Medicine

## 2016-10-15 DIAGNOSIS — H25813 Combined forms of age-related cataract, bilateral: Secondary | ICD-10-CM | POA: Diagnosis not present

## 2016-10-27 ENCOUNTER — Emergency Department (HOSPITAL_COMMUNITY)
Admission: EM | Admit: 2016-10-27 | Discharge: 2016-10-28 | Disposition: A | Discharge status: Home / Self Care | Payer: Medicare Other | Attending: Emergency Medicine | Admitting: Emergency Medicine

## 2016-10-27 ENCOUNTER — Encounter (HOSPITAL_COMMUNITY): Payer: Self-pay

## 2016-10-27 DIAGNOSIS — C50411 Malignant neoplasm of upper-outer quadrant of right female breast: Secondary | ICD-10-CM | POA: Diagnosis not present

## 2016-10-27 DIAGNOSIS — I10 Essential (primary) hypertension: Secondary | ICD-10-CM | POA: Diagnosis not present

## 2016-10-27 DIAGNOSIS — R1084 Generalized abdominal pain: Secondary | ICD-10-CM | POA: Diagnosis not present

## 2016-10-27 DIAGNOSIS — Z87891 Personal history of nicotine dependence: Secondary | ICD-10-CM | POA: Diagnosis not present

## 2016-10-27 DIAGNOSIS — R51 Headache: Secondary | ICD-10-CM | POA: Diagnosis not present

## 2016-10-27 DIAGNOSIS — R109 Unspecified abdominal pain: Secondary | ICD-10-CM | POA: Diagnosis present

## 2016-10-27 DIAGNOSIS — K5732 Diverticulitis of large intestine without perforation or abscess without bleeding: Secondary | ICD-10-CM | POA: Diagnosis not present

## 2016-10-27 DIAGNOSIS — C7951 Secondary malignant neoplasm of bone: Secondary | ICD-10-CM | POA: Insufficient documentation

## 2016-10-27 DIAGNOSIS — E039 Hypothyroidism, unspecified: Secondary | ICD-10-CM | POA: Diagnosis not present

## 2016-10-27 DIAGNOSIS — Z7982 Long term (current) use of aspirin: Secondary | ICD-10-CM | POA: Diagnosis not present

## 2016-10-27 DIAGNOSIS — Z79899 Other long term (current) drug therapy: Secondary | ICD-10-CM | POA: Diagnosis not present

## 2016-10-27 NOTE — ED Triage Notes (Signed)
Pt reports LUQ & LLQ abdominal pain. She is also reporting constipation and bloating at this time, but had one episode of diarrhea last Sunday. She has a hx of diverticulosis. Last cancer treatment was early this month. She is unsure of the date. Denies vomiting, but endorses nausea. She states that she is having difficulty sleeping d/t pain and nausea. A&Ox4. Ambulatory.

## 2016-10-27 NOTE — ED Notes (Signed)
Blood draw attempt X1. Pt wishes to not be stuck anymore til she gets her IV.

## 2016-10-27 NOTE — ED Provider Notes (Signed)
[facility] DEPT Provider Note   CSN: [id number] Arrival date & time: 10/27/16  2137  By signing my name below, I, Theresia Bough, attest that this documentation has been prepared under the direction and in the presence of Ripley Fraise, MD. Electronically Signed: Theresia Bough, ED Scribe. 10/28/16. 12:17 AM.  History   Chief Complaint Chief Complaint  Patient presents with  . Abdominal Pain    cancer patient  . Nausea  . Constipation  HPI Comments:  Erica Young is a 76 y.o. female  The history is provided by the patient. No language interpreter was used.  Abdominal Pain   This is a new problem. The current episode started more than 2 days ago. The problem occurs daily. The problem has not changed since onset.Associated with: cancer. The pain is located in the generalized abdominal region. The pain is moderate. Associated symptoms include diarrhea (6 days ago), nausea, constipation and headaches (mild). Pertinent negatives include fever, vomiting and dysuria. Nothing relieves the symptoms. Past workup does not include surgery. Past medical history comments: diverticulitis.  Constipation   This is a new problem. The current episode started more than 2 days ago. Associated symptoms include abdominal pain. Pertinent negatives include no dysuria. Past medical history comments: diverticulitis.    Past Medical History:  Diagnosis Date  . Allergy   . Anxiety   . 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 Cancer [facility]) 2007   right breat ca  , lumpectomy and radiation tx (declined chemo and additional prophylactic meds)  . CEREBROVASCULAR DISEASE 03/03/2010  . Diverticulitis   . DIVERTICULOSIS, COLON 03/03/2010  . Fatty liver 08/02/09   as per U/S done by [facility]  . Foramen ovale    positive bubble study  . Headache(784.0)    she thinks sinus headaches  . History of cerebral artery stenosis    right middle  .  HYPERLIPIDEMIA 03/03/2010  . Hypertension   . HYPOTHYROIDISM 03/03/2010   no longer on meds  . Internal hemorrhoid   . Low back pain   . Mastoiditis    noted on brain MRI  . Rib fracture   . Right leg pain   . Shortness of breath   . Stroke [facility]) Oct. 2011   TIA  . Ulcer     Patient Active Problem List   Diagnosis Date Noted  . Malignant neoplasm of upper-outer quadrant of right breast in female, estrogen receptor positive ([place]) 01/19/2016  . Bone metastases ([place]) 11/22/2014  . Rhinitis, allergic 02/12/2014  . Cancer of right breast, stage 4 ([place]) 01/23/2014  . History of breast cancer 01/11/2014  . Chronic rhinitis 09/17/2013  . Hilar adenopathy 05/12/2013  . Abdominal pain 05/07/2011  . Hypertension 06/09/2010  . BACK PAIN 04/10/2010  . Dyslipidemia 03/03/2010  . Cerebrovascular disease 03/03/2010  . DIVERTICULOSIS, COLON 03/03/2010    Past Surgical History:  Procedure Laterality Date  . BREAST LUMPECTOMY     right  . CHOLECYSTECTOMY    . COLONOSCOPY    . SHOULDER SURGERY     right shoulder (dislocation)  . TONSILLECTOMY    . TUBAL LIGATION    . VIDEO BRONCHOSCOPY WITH ENDOBRONCHIAL ULTRASOUND N/A 01/07/2014   Procedure: VIDEO BRONCHOSCOPY WITH ENDOBRONCHIAL ULTRASOUND;  Surgeon: Ivin Poot, MD;  Location: [facility] OR;  Service: Thoracic;  Laterality: N/A;    OB History    Gravida Para Term Preterm AB Living   4 2  SAB TAB Ectopic Multiple Live Births                   Home Medications    Prior to Admission medications   Medication Sig Start Date End Date Taking? Authorizing Provider  ALPRAZolam (XANAX) 0.25 MG tablet Take 1 tablet (0.25 mg total) by mouth 3 (three) times daily as needed. for anxiety 10/03/16  Yes Marin Olp, MD  aspirin EC 81 MG tablet Take 81 mg by mouth daily after breakfast.    Yes [provider]  CARTIA XT 120 MG 24 hr capsule TAKE 1 CAPSULE BY MOUTH DAILY Patient taking differently: TAKE 120 MG BY MOUTH DAILY  10/12/16  Yes Marletta Lor, MD  Cholecalciferol (VITAMIN D PO) Take 1,000 Units by mouth 2 (two) times daily.   Yes [provider]  dicyclomine (BENTYL) 10 MG capsule Take 1 capsule (10 mg total) by mouth 4 (four) times daily -  before meals and at bedtime. Patient taking differently: Take 10 mg by mouth 4 (four) times daily as needed for spasms.  08/22/16  Yes Marletta Lor, MD  docusate sodium (COLACE) 100 MG capsule Take 100 mg by mouth daily as needed for mild constipation.   Yes [provider]  Flaxseed, Linseed, (FLAX SEEDS PO) Take 1 tablet by mouth daily.    Yes [provider]  Glucosamine 500 MG CAPS Take 500 mg by mouth daily.    Yes [provider]  loperamide (IMODIUM) 1 MG/5ML solution Take 2 mg by mouth as needed for diarrhea or loose stools.   Yes [provider]  meclizine (ANTIVERT) 25 MG tablet TAKE 1 TABLET(25 MG) BY MOUTH EVERY 6 HOURS AS NEEDED FOR DIZZINESS 04/17/16  Yes Marletta Lor, MD  omeprazole (PRILOSEC) 20 MG capsule Take 1 capsule (20 mg total) by mouth daily. Patient taking differently: Take 20 mg by mouth daily as needed (reflux).  07/12/16  Yes Magrinat, Virgie Dad, MD  palbociclib [facility]) 75 MG capsule Take 1 capsule (75 mg total) by mouth daily with breakfast. Take whole with food. Patient taking differently: Take 75 mg by mouth 3 (three) times a week. Take every mon,wed,fri for 3 weeks, then take 1 week off. 10/04/16  Yes Magrinat, Virgie Dad, MD  Probiotic Product (PROBIOTIC DAILY PO) Take 1 tablet by mouth daily.    Yes [provider]  simvastatin (ZOCOR) 5 MG tablet TAKE 1 TABLET BY MOUTH DAILY Patient taking differently: TAKE 5 MG BY MOUTH DAILY 08/15/16  Yes Marletta Lor, MD    Family History Family History  Problem Relation Age of Onset  . Heart disease Sister        cerebral vascular disease also  . Heart disease Brother        cerebral vascular disease also  . Heart  disease Brother        cerebral vascular disease  . Heart disease Sister        cebreal vascular disease also  . Heart disease Sister        cebreal vascular diease also  . Heart disease Sister        cerebral vascular diease also  . Stomach cancer Father   . Colon cancer Neg Hx   . Esophageal cancer Neg Hx   . Rectal cancer Neg Hx     Social History Social History  Substance Use Topics  . Smoking status: Former Smoker    Packs/day: 0.50  Years: 20.00    Types: Cigarettes    Quit date: 05/01/1991  . Smokeless tobacco: Never Used  . Alcohol use No     Allergies   Ciprofloxacin   Review of Systems Review of Systems  Constitutional: Negative for fever.  Respiratory: Negative for shortness of breath.   Cardiovascular: Negative for chest pain.  Gastrointestinal: Positive for abdominal pain, constipation, diarrhea (6 days ago) and nausea. Negative for blood in stool and vomiting.  Genitourinary: Negative for dysuria.  Neurological: Positive for headaches (mild).  All other systems reviewed and are negative.    Physical Exam Updated Vital Signs BP (!) 141/72 (BP Location: Left Arm)   Pulse 64   Temp 97.8 F (36.6 C) (Oral)   Resp 18   SpO2 95%   Physical Exam CONSTITUTIONAL: Well developed/well nourished HEAD: Normocephalic/atraumatic EYES: EOMI/PERRL, no icterus ENMT: Mucous membranes moist NECK: supple no meningeal signs SPINE/BACK:entire spine nontender CV: S1/S2 noted, no murmurs/rubs/gallops noted LUNGS: Lungs are clear to auscultation bilaterally, no apparent distress ABDOMEN: soft, diffuse abdominal tenderness, no rebound or guarding, bowel sounds noted throughout abdomen GU:no cva tenderness NEURO: Pt is awake/alert/appropriate, moves all extremitiesx4.  No facial droop.   EXTREMITIES: pulses normal/equal, full ROM SKIN: warm, color normal PSYCH: no abnormalities of mood noted, alert and oriented to situation   ED Treatments / Results  DIAGNOSTIC  STUDIES: Oxygen Saturation is 96% on RA, normal by my interpretation.   COORDINATION OF CARE: 12:13 AM-Discussed next steps with pt including a CT. Pt verbalized understanding and is agreeable with the plan.    Labs (all labs ordered are listed, but only abnormal results are displayed) Labs Reviewed  COMPREHENSIVE METABOLIC PANEL - Abnormal; Notable for the following:       Result Value   Glucose, Bld 105 (*)    All other components within normal limits  CBC - Abnormal; Notable for the following:    MCV 101.0 (*)    MCH 35.8 (*)    Platelets 146 (*)    All other components within normal limits  URINALYSIS, ROUTINE W REFLEX MICROSCOPIC - Abnormal; Notable for the following:    Color, Urine STRAW (*)    Hgb urine dipstick SMALL (*)    Leukocytes, UA TRACE (*)    Squamous Epithelial / LPF 0-5 (*)    All other components within normal limits  LIPASE, BLOOD    EKG  EKG Interpretation None       Radiology Ct Abdomen Pelvis W Contrast  Result Date: 10/28/2016 CLINICAL DATA:  Generalized abdominal pain EXAM: CT ABDOMEN AND PELVIS WITH CONTRAST TECHNIQUE: Multidetector CT imaging of the abdomen and pelvis was performed using the standard protocol following bolus administration of intravenous contrast. CONTRAST:  100 mL Isovue-300 intravenous COMPARISON:  10/03/2015, bone scan 07/09/2016 FINDINGS: Lower chest: 7 mm pulmonary nodule in the right lower lobe unchanged. Small adjacent nodular densities, not clearly visible on the prior study no pleural effusions. Retraction of left nipple as before. Normal heart size. Hepatobiliary: Surgical clips in the gallbladder fossa. No focal hepatic abnormality is seen. Stable enlarged extrahepatic bile duct, slightly increased compared to prior. Pancreas: Unremarkable. No pancreatic ductal dilatation or surrounding inflammatory changes. Spleen: Normal in size without focal abnormality. Adrenals/Urinary Tract: Adrenal glands are within normal limits.  No hydronephrosis. Stable hypodense lesions in the mid right kidney. The bladder is unremarkable. Stomach/Bowel: The stomach is nonenlarged. No dilated small bowel. Moderate stool in the colon. No wall thickening. Normal appendix. Sigmoid colon diverticula. Focal  wall thickening of the rectosigmoid colon. Not much surrounding inflammation. Vascular/Lymphatic: Aortic atherosclerosis. No significantly enlarged lymph nodes. Reproductive: Uterus and bilateral adnexa are unremarkable. Other: No free air or free fluid. Musculoskeletal: Mixed sclerotic and lucent lesions in the spine, consistent with osseous metastatic disease IMPRESSION: 1. Focal wall thickening involving the rectosigmoid colon, findings could be secondary to a focal colitis or possible diverticulitis, given the presence of diverticula in the sigmoid colon. Follow-up colonoscopy when clinically feasible should be considered to exclude a mass as cause for thickening. There is no evidence for a bowel obstruction. 2. Stable 7 mm right lower lobe pulmonary nodule. Adjacent clustered nodularity, not clearly visible on prior studies, attention to this region on CT follow-up is recommended. 3. 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 Electronically Signed   By: Donavan Foil M.D.   On: 10/28/2016 02:50    Procedures Procedures   Medications Ordered in ED Medications  iopamidol (ISOVUE-300) 61 % injection 100 mL (100 mLs Intravenous Contrast Given 10/28/16 0215)  Ampicillin-Sulbactam (UNASYN) 3 g in sodium chloride 0.9 % 100 mL IVPB (0 g Intravenous Stopped 10/28/16 0417)     Initial Impression / Assessment and Plan / ED Course  I have reviewed the triage vital signs and the nursing notes.  Pertinent labs   results that were available during my care of the patient were reviewed by me and considered in my medical decision making (see chart for details).     4:38 AM Pt overall well  appearing No distress She declined pain meds CT reveals no perforation but she has diverticulitis I gave patient option of admission vs outpatient management She prefers to go home She is well appearing/no distress/nontoxic This seems reasonable Due to cipro allergy, will give augmentin PCP f/u in one week Will need colonoscopy once infection resolves and pt understands this We discussed strict return precautions   Final Clinical Impressions(s) / ED Diagnoses   Final diagnoses:  Diverticulitis of sigmoid colon    New Prescriptions New Prescriptions   AMOXICILLIN-CLAVULANATE (AUGMENTIN) 875-125 MG TABLET    Take 1 tablet by mouth 2 (two) times daily. One po bid x 7 days   I personally performed the services described in this documentation, which was scribed in my presence. The recorded information has been reviewed and is accurate.    Ripley Fraise, MD 10/28/16 [phone]

## 2016-10-28 ENCOUNTER — Emergency Department (HOSPITAL_COMMUNITY): Payer: Medicare Other

## 2016-10-28 ENCOUNTER — Encounter (HOSPITAL_COMMUNITY): Payer: Self-pay | Admitting: Radiology

## 2016-10-28 DIAGNOSIS — R1084 Generalized abdominal pain: Secondary | ICD-10-CM | POA: Diagnosis not present

## 2016-10-28 DIAGNOSIS — K5732 Diverticulitis of large intestine without perforation or abscess without bleeding: Secondary | ICD-10-CM | POA: Diagnosis not present

## 2016-10-28 LAB — COMPREHENSIVE METABOLIC PANEL
ALK PHOS: 56 U/L (ref 38–126)
ALT: 19 U/L (ref 14–54)
ANION GAP: 12 (ref 5–15)
AST: 23 U/L (ref 15–41)
Albumin: 4.3 g/dL (ref 3.5–5.0)
BUN: 11 mg/dL (ref 6–20)
CALCIUM: 9.4 mg/dL (ref 8.9–10.3)
CO2: 22 mmol/L (ref 22–32)
CREATININE: 0.77 mg/dL (ref 0.44–1.00)
Chloride: 105 mmol/L (ref 101–111)
Glucose, Bld: 105 mg/dL — ABNORMAL HIGH (ref 65–99)
Potassium: 4.5 mmol/L (ref 3.5–5.1)
SODIUM: 139 mmol/L (ref 135–145)
Total Bilirubin: 0.9 mg/dL (ref 0.3–1.2)
Total Protein: 7.7 g/dL (ref 6.5–8.1)

## 2016-10-28 LAB — CBC
HCT: 40.6 % (ref 36.0–46.0)
HEMOGLOBIN: 14.4 g/dL (ref 12.0–15.0)
MCH: 35.8 pg — ABNORMAL HIGH (ref 26.0–34.0)
MCHC: 35.5 g/dL (ref 30.0–36.0)
MCV: 101 fL — ABNORMAL HIGH (ref 78.0–100.0)
PLATELETS: 146 10*3/uL — AB (ref 150–400)
RBC: 4.02 MIL/uL (ref 3.87–5.11)
RDW: 13.3 % (ref 11.5–15.5)
WBC: 5.2 10*3/uL (ref 4.0–10.5)

## 2016-10-28 LAB — URINALYSIS, ROUTINE W REFLEX MICROSCOPIC
BILIRUBIN URINE: NEGATIVE
Bacteria, UA: NONE SEEN
GLUCOSE, UA: NEGATIVE mg/dL
KETONES UR: NEGATIVE mg/dL
NITRITE: NEGATIVE
PROTEIN: NEGATIVE mg/dL
Specific Gravity, Urine: 1.005 (ref 1.005–1.030)
pH: 6 (ref 5.0–8.0)

## 2016-10-28 LAB — LIPASE, BLOOD: LIPASE: 14 U/L (ref 11–51)

## 2016-10-28 MED ORDER — SODIUM CHLORIDE 0.9 % IV SOLN
3.0000 g | Freq: Once | INTRAVENOUS | Status: AC
  Administered 2016-10-28: 3 g via INTRAVENOUS
  Filled 2016-10-28: qty 3

## 2016-10-28 MED ORDER — AMOXICILLIN-POT CLAVULANATE 875-125 MG PO TABS: 1.0000 | ORAL_TABLET | Freq: Two times a day (BID) | ORAL | 0 refills | Status: DC

## 2016-10-28 MED ORDER — IOPAMIDOL (ISOVUE-300) INJECTION 61%
INTRAVENOUS | Status: AC
  Filled 2016-10-28: qty 100

## 2016-10-28 MED ORDER — IOPAMIDOL (ISOVUE-300) INJECTION 61%
100.0000 mL | Freq: Once | INTRAVENOUS | Status: AC | PRN
  Administered 2016-10-28: 100 mL via INTRAVENOUS

## 2016-10-28 NOTE — ED Notes (Signed)
Pt is aware that urine is needed for sample.

## 2016-10-29 ENCOUNTER — Other Ambulatory Visit: Payer: Self-pay | Admitting: Internal Medicine

## 2016-10-30 NOTE — Telephone Encounter (Signed)
10/03/16 was last refill. Rx must last 30 days. May call in Rx on 11/02/16.

## 2016-10-31 NOTE — Progress Notes (Signed)
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 She continues on monthly fulvestrant, with good tolerance. She is also on Prilosec live, which she restarted 10/26/2016 for the current cycle. She takes at Mondays Wednesdays and Fridays. She tolerates that well.   REVIEW OF SYSTEMS: Erica Young was evaluated in the emergency room blood for persistent abdominal discomfort. CT scan was obtained which shows sigmoid diverticular disease. Endoscopy was suggested to make sure nothing else was going on. She was given Augmentin but she does not want to take it because of the "high-dose". She goes by the dose with no consideration of different potency's. This is very frustrating to her husband, Kela Millin is better. Aside from these issues a detailed review of systems today was stable  BREAST CANCER HISTORY: From the earlier summary note  Erica Young tells me in 2007 while living in [place] she was found to have a very small cancer in the upper-outer quadrant of the right breast, about the size of the P, noted on mammography. It was not palpable. She had a right lumpectomy and full sentinel lymph node sampling. She then received adjuvant radiation, to a total of 33 treatments.  She received no systemic therapy.  She then did well until December 2014, when she had a fall and complain of pain in her left ribs. Rib films and chest x-ray on 04/13/2013 showed no fractures, but CT of the abdomen and pelvis on the same day found subcarinal and right hilar adenopathy. This was followed up with a chest CT scan 05/05/2013 confirming extensive mediastinal and right hilar lymphadenopathy, with multiple pulmonary nodules, the largest measuring 0.9 cm. PET scan 05/21/2013 showed hypermetabolic adenopathy in the right and left paratracheal areas, the precarinal, subcarinal and right hilar areas, but no involvement of the liver, and the lung nodules were not hypermetabolic (although they were below the level of reliable detection). In addition, at L5 there was a lucency measuring 1.2 cm.  The PET scan also showed a hypermetabolic focus on the thyroid gland, which was evaluated with neck ultrasound and biopsy 35/32/9924, showing a follicular lesion of undetermined significance (([id number]).  The patient was referred to pulmonary [Dr Melvyn Novas and Dr Julien Nordmann for further evaluation. Repeat PET scan 12/28/2013 showed, in addition to the adenopathy previously noted, now multiple bony metastases. On 01/07/2014 the patient underwent bronchoscopy and this showed ([id number], together with separate cytology] a low-grade mucinous invasive breast cancer, estrogen receptor 100% positive, progesterone receptor 14% positive, with no HER-2 amplification, the signals ratio being 1.30 and the number per cell 1.95.  The patient was started on anastrozole 01/22/2014; monthly denosumab/Xgeva was added 07/30/2014. She appeared to tolerate this well, and her CA-27-29 (127 at baseline) normalized. Most recent CT scans of chest abdomen and pelvis 10/03/2015 showed continuing response. Despite this good news however, the patient decided to go off anastrozole and  denosumab/Xgeva as of June 2017. By the time she saw Dr.  Earlie Server again in September 2017 her tumor marker had doubled. At that time the patient was referred to the breast clinic for a second opinion regarding further evaluation and treatment.   PAST MEDICAL HISTORY: Past Medical History:  Diagnosis Date  . Allergy   . Anxiety   . Bone cancer ([place]) mri 11/11/14   right parietal bone,left cribriform plate metastases  . Cancer [facility]) 2007   right breat ca  , lumpectomy and radiation tx (declined chemo and additional prophylactic meds)  . CEREBROVASCULAR DISEASE 03/03/2010  . Diverticulitis   . DIVERTICULOSIS, COLON 03/03/2010  . Fatty liver 08/02/09   as per U/S done by [facility] Radiology  . Foramen ovale    positive bubble study  . Headache(784.0)    she thinks sinus headaches  . History of cerebral artery stenosis    right middle  . HYPERLIPIDEMIA 03/03/2010  . Hypertension   . HYPOTHYROIDISM 03/03/2010   no longer on meds  . Internal hemorrhoid   . Low back pain   . Mastoiditis    noted on brain MRI  . Rib fracture   . Right leg pain   . Shortness of breath   . Stroke [facility]) Oct. 2011   TIA  . Ulcer     PAST SURGICAL HISTORY: Past Surgical History:  Procedure Laterality Date  . BREAST LUMPECTOMY     right  . CHOLECYSTECTOMY    . COLONOSCOPY    . SHOULDER SURGERY     right shoulder (dislocation)  . TONSILLECTOMY    . TUBAL LIGATION    . VIDEO BRONCHOSCOPY WITH ENDOBRONCHIAL ULTRASOUND N/A 01/07/2014   Procedure: VIDEO BRONCHOSCOPY WITH ENDOBRONCHIAL ULTRASOUND;  Surgeon: Ivin Poot, MD;  Location: [facility] OR;  Service: Thoracic;  Laterality: N/A;    FAMILY HISTORY Family History  Problem Relation Age of Onset  . Heart disease Sister        cerebral vascular disease also  . Heart disease Brother        cerebral vascular disease also  . Heart disease Brother        cerebral vascular disease  . Heart disease Sister        cebreal vascular disease also  . Heart disease Sister        cebreal vascular diease also  .  Heart disease Sister        cerebral vascular diease also  . Stomach cancer Father   . Colon cancer Neg Hx   . Esophageal cancer Neg Hx   . Rectal cancer Neg Hx   The patient's father died from stomach cancer in his late 70s. The patient's mother died in her 57s from a stroke. The patient has 2 brothers, 4 sisters. One sister had leukemia. There is no history of breast, colon, or ovarian cancer in the family to her knowledge  GYNECOLOGIC HISTORY:  No LMP recorded. Patient is postmenopausal. Menarche age 19, first live birth age 34, the patient is [place] P2. She went through menopause in her late 34s. She took no hormone replacement. She took oral contraceptives for approximately 2 years remotely without complications.  SOCIAL HISTORY:  Erica Young is originally from [place]. 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 Her husband Erica Young is a retired substance abuse Social worker. This is a second marriage for both of them. Jennene has 2 children from her first marriage, Erica Young, lives  in [place], and worked as a Audiological scientist but is now disabled, and Erica Young, who lives in [place] and works in Engineer, mining. The patient has 6 grandchildren. She attends [employer]Erica Young has 3 children from his first marriage. Erica Young lives in [place] and has his own trucking business. He tells me he is estranged from his 2 daughters Erica Young (a retired Pharmacist, hospital) and Erica Young (who works in a bank). They living [place].    ADVANCED DIRECTIVES: in place   HEALTH MAINTENANCE: Social History  Substance Use Topics  . Smoking status: Former Smoker    Packs/day: 0.50    Years: 20.00    Types: Cigarettes    Quit date: 05/01/1991  . Smokeless tobacco: Never Used  . Alcohol use No     Colonoscopy:  PAP:  Bone density:   Allergies  Allergen Reactions  . Ciprofloxacin Anaphylaxis    Current Outpatient Prescriptions  Medication Sig Dispense  Refill  . FULVESTRANT IM Inject into the muscle.    Marland Kitchen ALPRAZolam (XANAX) 0.25 MG tablet Take 1 tablet (0.25 mg total) by mouth 3 (three) times daily as needed. for anxiety 90 tablet 0  . amoxicillin-clavulanate (AUGMENTIN) 875-125 MG tablet Take 1 tablet by mouth 2 (two) times daily. One po bid x 7 days 14 tablet 0  . aspirin EC 81 MG tablet Take 81 mg by mouth daily after breakfast.     . CARTIA XT 120 MG 24 hr capsule TAKE 1 CAPSULE BY MOUTH DAILY (Patient taking differently: TAKE 120 MG BY MOUTH DAILY) 90 capsule 1  . Cholecalciferol (VITAMIN D PO) Take 1,000 Units by mouth 2 (two) times daily.    Marland Kitchen dicyclomine (BENTYL) 10 MG capsule Take 1 capsule (10 mg total) by mouth 4 (four) times daily -  before meals and at bedtime. (Patient taking differently: Take 10 mg by mouth 4 (four) times daily as needed for spasms. ) 60 capsule 2  . docusate sodium (COLACE) 100 MG capsule Take 100 mg by mouth daily as needed for mild constipation.    . Flaxseed, Linseed, (FLAX SEEDS PO) Take 1 tablet by mouth daily.     . Glucosamine 500 MG CAPS Take 500 mg by mouth daily.     . meclizine (ANTIVERT) 25 MG tablet TAKE 1 TABLET(25 MG) BY MOUTH EVERY 6 HOURS AS NEEDED FOR DIZZINESS 60 tablet 0  . omeprazole (PRILOSEC) 20 MG capsule Take 1 capsule (20 mg total) by mouth daily. (Patient taking differently: Take 20 mg by mouth daily as needed (reflux). ) 90 capsule 4  . palbociclib (IBRANCE) 75 MG capsule Take 1 capsule (75 mg total) by mouth daily with breakfast. Take whole with food. (Patient taking differently: Take 75 mg by mouth 3 (three) times a week. Take every mon,wed,fri for 3 weeks, then take 1 week off.) 21 capsule 6  . Probiotic Product (PROBIOTIC DAILY PO) Take 1 tablet by mouth daily.     . simvastatin (ZOCOR) 5 MG tablet TAKE 1 TABLET BY MOUTH DAILY (Patient taking differently: TAKE 5 MG BY MOUTH DAILY) 90 tablet 1   No current facility-administered medications for this visit.     OBJECTIVE: Older white  womanWho appears stated age  63:   11/01/16 0924  BP: 116/68  Pulse: 73  Resp: 18  Temp: 97.7 F (36.5 C)     Body mass index is 22.9 kg/m.    ECOG FS:1 - Symptomatic but completely ambulatory  Sclerae unicteric, pupils round and  equal Oropharynx clear and moist No cervical or supraclavicular adenopathy Lungs no rales or rhonchi Heart regular rate and rhythm Abd soft, nontender, positive bowel sounds MSK no focal spinal tenderness, no upper extremity lymphedema Neuro: nonfocal, well oriented, appropriate affect Breasts: Deferred    LAB RESULTS:  CMP     Component Value Date/Time   NA 138 11/01/2016 0854   K 3.6 11/01/2016 0854   CL 105 10/28/2016 0005   CO2 24 11/01/2016 0854   GLUCOSE 126 11/01/2016 0854   BUN 8.8 11/01/2016 0854   CREATININE 0.8 11/01/2016 0854   CALCIUM 9.4 11/01/2016 0854   PROT 6.7 11/01/2016 0854   ALBUMIN 3.6 11/01/2016 0854   AST 16 11/01/2016 0854   ALT 12 11/01/2016 0854   ALKPHOS 59 11/01/2016 0854   BILITOT 0.43 11/01/2016 0854   GFRNONAA >60 10/28/2016 0005   GFRAA >60 10/28/2016 0005    INo results found for: SPEP, UPEP  Lab Results  Component Value Date   WBC 3.5 (L) 11/01/2016   NEUTROABS 1.7 11/01/2016   HGB 12.8 11/01/2016   HCT 36.8 11/01/2016   MCV 102.2 (H) 11/01/2016   PLT 169 11/01/2016      Chemistry      Component Value Date/Time   NA 138 11/01/2016 0854   K 3.6 11/01/2016 0854   CL 105 10/28/2016 0005   CO2 24 11/01/2016 0854   BUN 8.8 11/01/2016 0854   CREATININE 0.8 11/01/2016 0854      Component Value Date/Time   CALCIUM 9.4 11/01/2016 0854   ALKPHOS 59 11/01/2016 0854   AST 16 11/01/2016 0854   ALT 12 11/01/2016 0854   BILITOT 0.43 11/01/2016 0854       Lab Results  Component Value Date   LABCA2 25 08/30/2015    No components found for: [id number]  No results for input(s): INR in the last 168 hours.  Urinalysis    Component Value Date/Time   COLORURINE STRAW (A) 10/27/2016 2225     APPEARANCEUR CLEAR 10/27/2016 2225   LABSPEC 1.005 10/27/2016 2225   PHURINE 6.0 10/27/2016 2225   GLUCOSEU NEGATIVE 10/27/2016 2225   HGBUR SMALL (A) 10/27/2016 2225   BILIRUBINUR NEGATIVE 10/27/2016 2225   BILIRUBINUR neg 02/16/2015 1318   KETONESUR NEGATIVE 10/27/2016 2225   PROTEINUR NEGATIVE 10/27/2016 2225   UROBILINOGEN 0.2 02/16/2015 1318   UROBILINOGEN 0.2 11/18/2013 1321   NITRITE NEGATIVE 10/27/2016 2225   LEUKOCYTESUR TRACE (A) 10/27/2016 2225     STUDIES: Ct Abdomen Pelvis W Contrast  Result Date: 10/28/2016 CLINICAL DATA:  Generalized abdominal pain EXAM: CT ABDOMEN AND PELVIS WITH CONTRAST TECHNIQUE: Multidetector CT imaging of the abdomen and pelvis was performed using the standard protocol following bolus administration of intravenous contrast. CONTRAST:  100 mL Isovue-300 intravenous COMPARISON:  10/03/2015, bone scan 07/09/2016 FINDINGS: Lower chest: 7 mm pulmonary nodule in the right lower lobe unchanged. Small adjacent nodular densities, not clearly visible on the prior study no pleural effusions. Retraction of left nipple as before. Normal heart size. Hepatobiliary: Surgical clips in the gallbladder fossa. No focal hepatic abnormality is seen. Stable enlarged extrahepatic bile duct, slightly increased compared to prior. Pancreas: Unremarkable. No pancreatic ductal dilatation or surrounding inflammatory changes. Spleen: Normal in size without focal abnormality. Adrenals/Urinary Tract: Adrenal glands are within normal limits. No hydronephrosis. Stable hypodense lesions in the mid right kidney. The bladder is unremarkable. Stomach/Bowel: The stomach is nonenlarged. No dilated small bowel. Moderate stool in the colon. No wall thickening. Normal appendix. Sigmoid  colon diverticula. Focal wall thickening of the rectosigmoid colon. Not much surrounding inflammation. Vascular/Lymphatic: Aortic atherosclerosis. No significantly enlarged lymph nodes. Reproductive: Uterus and  bilateral adnexa are unremarkable. Other: No free air or free fluid. Musculoskeletal: Mixed sclerotic and lucent lesions in the spine, consistent with osseous metastatic disease IMPRESSION: 1. Focal wall thickening involving the rectosigmoid colon, findings could be secondary to a focal colitis or possible diverticulitis, given the presence of diverticula in the sigmoid colon. Follow-up colonoscopy when clinically feasible should be considered to exclude a mass as cause for thickening. There is no evidence for a bowel obstruction. 2. Stable 7 mm right lower lobe pulmonary nodule. Adjacent clustered nodularity, not clearly visible on prior studies, attention to this region on CT follow-up is recommended. 3. 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 Electronically Signed   By: Donavan Foil M.D.   On: 10/28/2016 02:50     ELIGIBLE FOR AVAILABLE RESEARCH PROTOCOL: no  ASSESSMENT: 76 y.o. [place] woman  (1) status post right breast upper outer quadrant lumpectomy and axillary lymph node dissection in 2007 for what appears to have been a T1 N0, stage IA invasive ductal carcinoma, treated adjuvantly with radiation (33 sessions)  METASTATIC DISEASE definitively documented Sept 2015 (2) bronchoscopic biopsy 01/07/2014 showed a low-grade mucinous breast cancer, strongly estrogen receptor positive, progesterone receptor positive, HER-2 negative; staging studies confirmed extensive hypermetabolic adenopathy, multiple bone lesions, likely early lung involvement, but no liver lesions.  (a) bone scan and chest CT 07/09/2016 shows stable disease.  (3) on Arimidex between September 2015 and June 2017, with evidence of response; discontinued secondary to side effects  (a) bone density 04/10/2016 shows osteopenia with a T score of -2.1  (4) on monthly denosumab/Xgeva between 07/30/2014 and 10/04/2015, discontinued due to patient's concerns regarding  osteonecrosis of the jaw  (a) discussed again October 2017, the patient adamantly refusing denosumab  (5) started fulvestrant 01/26/2016   (a) Palbociclib added at 75 mg M/W/F, beginning mid February 2018  PLAN I spent approximately 30 minutes with Ahilyn with most of that time spent discussing her concerns. As far as her breast cancer is concerned she is tolerating the fulvestrant well. She will receive a dose today and August 2. Her next dose will be August 30 and I will see her on that day.  She is also tolerating the palpable cyclic well. This is her second week of the current cycle. She has good counts. I'm making no changes in that medication.  I would like to give her bisphosphonates or do not some but she adamantly refuses.  I reviewed with her the results of her CT of the abdomen which strongly suggest diverticular disease. She has in addition a variety of GI concerns that are fairly chronic. She is to see Delfin Edis about all this but of course Dr. Maurene Capes has retired. I think it would be a good idea if Ramia reestablished herself with the lobe our GI group. I have placed that referral today.  Otherwise the plan is to continue current treatment at least through this year. I am intending to restage her with a PET scan sometime in late November or early December..   She knows to call for any other problems that may develop before the next visit. Chauncey Cruel  11/01/2016 Medical Oncology and Hematology [facility] [address] [place], [place] [address] Tel. [phone]    Fax. [phone]

## 2016-11-01 ENCOUNTER — Ambulatory Visit (HOSPITAL_BASED_OUTPATIENT_CLINIC_OR_DEPARTMENT_OTHER): Payer: Medicare Other

## 2016-11-01 ENCOUNTER — Ambulatory Visit (HOSPITAL_BASED_OUTPATIENT_CLINIC_OR_DEPARTMENT_OTHER): Payer: Medicare Other | Admitting: Oncology

## 2016-11-01 ENCOUNTER — Other Ambulatory Visit (HOSPITAL_BASED_OUTPATIENT_CLINIC_OR_DEPARTMENT_OTHER): Payer: Medicare Other

## 2016-11-01 VITALS — BP 116/68 | HR 73 | Temp 97.7°F | Resp 18 | Ht 62.0 in | Wt 125.2 lb

## 2016-11-01 DIAGNOSIS — C7951 Secondary malignant neoplasm of bone: Secondary | ICD-10-CM

## 2016-11-01 DIAGNOSIS — Z853 Personal history of malignant neoplasm of breast: Secondary | ICD-10-CM

## 2016-11-01 DIAGNOSIS — C50911 Malignant neoplasm of unspecified site of right female breast: Secondary | ICD-10-CM | POA: Diagnosis not present

## 2016-11-01 DIAGNOSIS — C50411 Malignant neoplasm of upper-outer quadrant of right female breast: Secondary | ICD-10-CM | POA: Diagnosis not present

## 2016-11-01 DIAGNOSIS — Z170 Estrogen receptor positive status [ER+]: Secondary | ICD-10-CM

## 2016-11-01 DIAGNOSIS — Z5111 Encounter for antineoplastic chemotherapy: Secondary | ICD-10-CM | POA: Diagnosis present

## 2016-11-01 LAB — CBC WITH DIFFERENTIAL/PLATELET
BASO%: 1.7 % (ref 0.0–2.0)
Basophils Absolute: 0.1 10*3/uL (ref 0.0–0.1)
EOS%: 6.3 % (ref 0.0–7.0)
Eosinophils Absolute: 0.2 10*3/uL (ref 0.0–0.5)
HEMATOCRIT: 36.8 % (ref 34.8–46.6)
HEMOGLOBIN: 12.8 g/dL (ref 11.6–15.9)
LYMPH#: 1.4 10*3/uL (ref 0.9–3.3)
LYMPH%: 39.3 % (ref 14.0–49.7)
MCH: 35.6 pg — ABNORMAL HIGH (ref 25.1–34.0)
MCHC: 34.8 g/dL (ref 31.5–36.0)
MCV: 102.2 fL — ABNORMAL HIGH (ref 79.5–101.0)
MONO#: 0.2 10*3/uL (ref 0.1–0.9)
MONO%: 5.7 % (ref 0.0–14.0)
NEUT#: 1.7 10*3/uL (ref 1.5–6.5)
NEUT%: 47 % (ref 38.4–76.8)
PLATELETS: 169 10*3/uL (ref 145–400)
RBC: 3.6 10*6/uL — ABNORMAL LOW (ref 3.70–5.45)
RDW: 12.8 % (ref 11.2–14.5)
WBC: 3.5 10*3/uL — ABNORMAL LOW (ref 3.9–10.3)

## 2016-11-01 LAB — COMPREHENSIVE METABOLIC PANEL
ALBUMIN: 3.6 g/dL (ref 3.5–5.0)
ALK PHOS: 59 U/L (ref 40–150)
ALT: 12 U/L (ref 0–55)
ANION GAP: 10 meq/L (ref 3–11)
AST: 16 U/L (ref 5–34)
BILIRUBIN TOTAL: 0.43 mg/dL (ref 0.20–1.20)
BUN: 8.8 mg/dL (ref 7.0–26.0)
CO2: 24 meq/L (ref 22–29)
Calcium: 9.4 mg/dL (ref 8.4–10.4)
Chloride: 104 mEq/L (ref 98–109)
Creatinine: 0.8 mg/dL (ref 0.6–1.1)
EGFR: 67 mL/min/{1.73_m2} — AB (ref >90)
Glucose: 126 mg/dl (ref 70–140)
POTASSIUM: 3.6 meq/L (ref 3.5–5.1)
SODIUM: 138 meq/L (ref 136–145)
TOTAL PROTEIN: 6.7 g/dL (ref 6.4–8.3)

## 2016-11-01 MED ORDER — FULVESTRANT 250 MG/5ML IM SOLN
500.0000 mg | Freq: Once | INTRAMUSCULAR | Status: AC
  Administered 2016-11-01: 500 mg via INTRAMUSCULAR
  Filled 2016-11-01: qty 10

## 2016-11-01 NOTE — Patient Instructions (Signed)

## 2016-11-01 NOTE — Telephone Encounter (Signed)
Pt would like to know if she can have refills on her Rx ALPRAZolam (XANAX) 0.25 MG tablet  For sure on 7/6. Pt states she is out and doesn't understand why she has to go through this every month.

## 2016-11-02 LAB — CANCER ANTIGEN 27.29: CAN 27.29: 53.8 U/mL — AB (ref 0.0–38.6)

## 2016-11-02 NOTE — Telephone Encounter (Signed)
Rx called in to pharmacy. Pt aware. Nothing further needed.

## 2016-11-02 NOTE — Telephone Encounter (Signed)
Okay for refill?  

## 2016-11-06 ENCOUNTER — Encounter: Payer: Self-pay | Admitting: Internal Medicine

## 2016-11-06 ENCOUNTER — Ambulatory Visit (INDEPENDENT_AMBULATORY_CARE_PROVIDER_SITE_OTHER): Payer: Medicare Other | Admitting: Internal Medicine

## 2016-11-06 VITALS — BP 104/68 | HR 70 | Temp 97.6°F | Ht 62.0 in | Wt 124.4 lb

## 2016-11-06 DIAGNOSIS — Z853 Personal history of malignant neoplasm of breast: Secondary | ICD-10-CM | POA: Diagnosis not present

## 2016-11-06 DIAGNOSIS — E785 Hyperlipidemia, unspecified: Secondary | ICD-10-CM | POA: Diagnosis not present

## 2016-11-06 DIAGNOSIS — I10 Essential (primary) hypertension: Secondary | ICD-10-CM

## 2016-11-06 LAB — LIPID PANEL
CHOL/HDL RATIO: 2
Cholesterol: 133 mg/dL (ref 0–200)
HDL: 60.6 mg/dL (ref >39.00)
LDL CALC: 54 mg/dL (ref 0–99)
NONHDL: 72.29
Triglycerides: 89 mg/dL (ref 0.0–149.0)
VLDL: 17.8 mg/dL (ref 0.0–40.0)

## 2016-11-06 LAB — TSH: TSH: 2.59 u[IU]/mL (ref 0.35–4.50)

## 2016-11-06 NOTE — Patient Instructions (Signed)
Limit your sodium (Salt) intake    It is important that you exercise regularly, at least 20 minutes 3 to 4 times per week.  If you develop chest pain or shortness of breath seek  medical attention.  Call or return to clinic prn if these symptoms worsen or fail to improve as anticipated.

## 2016-11-06 NOTE — Progress Notes (Signed)
Subjective:    Patient ID: Erica Young, female    DOB: [birth year], 76 y.o.   MRN: [id number]  HPI 76 year old patient who is followed closely.  Oncology with breast cancer.  She was seen in the ED recently and treated for acute diverticulitis.  She has completed antibiotic therapy and now is pain free She has several concerns and is anxious about planned cataract extraction surgery. She states her ophthalmologist has requested a TSH and a lipid profile  Past Medical History:  Diagnosis Date  . Allergy   . Anxiety   . Bone cancer ([place]) mri 11/11/14   right parietal bone,left cribriform plate metastases  . Cancer [facility]) 2007   right breat ca  , lumpectomy and radiation tx (declined chemo and additional prophylactic meds)  . CEREBROVASCULAR DISEASE 03/03/2010  . Diverticulitis   . DIVERTICULOSIS, COLON 03/03/2010  . Fatty liver 08/02/09   as per U/S done by [facility]  . Foramen ovale    positive bubble study  . Headache(784.0)    she thinks sinus headaches  . History of cerebral artery stenosis    right middle  . HYPERLIPIDEMIA 03/03/2010  . Hypertension   . HYPOTHYROIDISM 03/03/2010   no longer on meds  . Internal hemorrhoid   . Low back pain   . Mastoiditis    noted on brain MRI  . Rib fracture   . Right leg pain   . Shortness of breath   . Stroke [facility]) Oct. 2011   TIA  . Ulcer      Social History   Social History  . Marital status: Married    Spouse name: N/A  . Number of children: 2  . Years of education: N/A   Occupational History  . Social Worker--Retired    Social History Main Topics  . Smoking status: Former Smoker    Packs/day: 0.50    Years: 20.00    Types: Cigarettes    Quit date: 05/01/1991  . Smokeless tobacco: Never Used  . Alcohol use No  . Drug use: No  . Sexual activity: Yes   Other Topics Concern  . Not on file   Social History Narrative   Daily caffeine     Past Surgical History:  Procedure Laterality Date  . BREAST  LUMPECTOMY     right  . CHOLECYSTECTOMY    . COLONOSCOPY    . SHOULDER SURGERY     right shoulder (dislocation)  . TONSILLECTOMY    . TUBAL LIGATION    . VIDEO BRONCHOSCOPY WITH ENDOBRONCHIAL ULTRASOUND N/A 01/07/2014   Procedure: VIDEO BRONCHOSCOPY WITH ENDOBRONCHIAL ULTRASOUND;  Surgeon: Ivin Poot, MD;  Location: [facility] OR;  Service: Thoracic;  Laterality: N/A;    Family History  Problem Relation Age of Onset  . Heart disease Sister        cerebral vascular disease also  . Heart disease Brother        cerebral vascular disease also  . Heart disease Brother        cerebral vascular disease  . Heart disease Sister        cebreal vascular disease also  . Heart disease Sister        cebreal vascular diease also  . Heart disease Sister        cerebral vascular diease also  . Stomach cancer Father   . Colon cancer Neg Hx   . Esophageal cancer Neg Hx   . Rectal cancer Neg Hx  Allergies  Allergen Reactions  . Ciprofloxacin Anaphylaxis    Current Outpatient Prescriptions on File Prior to Visit  Medication Sig Dispense Refill  . ALPRAZolam (XANAX) 0.25 MG tablet TAKE 1 TABLET BY MOUTH THREE TIMES DAILY AS NEEDED FOR ANXIETY 90 tablet 0  . aspirin EC 81 MG tablet Take 81 mg by mouth daily after breakfast.     . CARTIA XT 120 MG 24 hr capsule TAKE 1 CAPSULE BY MOUTH DAILY (Patient taking differently: TAKE 120 MG BY MOUTH DAILY) 90 capsule 1  . Cholecalciferol (VITAMIN D PO) Take 1,000 Units by mouth 2 (two) times daily.    Marland Kitchen dicyclomine (BENTYL) 10 MG capsule Take 1 capsule (10 mg total) by mouth 4 (four) times daily -  before meals and at bedtime. (Patient taking differently: Take 10 mg by mouth 4 (four) times daily as needed for spasms. ) 60 capsule 2  . docusate sodium (COLACE) 100 MG capsule Take 100 mg by mouth daily as needed for mild constipation.    . Flaxseed, Linseed, (FLAX SEEDS PO) Take 1 tablet by mouth daily.     . FULVESTRANT IM Inject into the muscle.    .  Glucosamine 500 MG CAPS Take 500 mg by mouth daily.     . meclizine (ANTIVERT) 25 MG tablet TAKE 1 TABLET(25 MG) BY MOUTH EVERY 6 HOURS AS NEEDED FOR DIZZINESS 60 tablet 0  . omeprazole (PRILOSEC) 20 MG capsule Take 1 capsule (20 mg total) by mouth daily. (Patient taking differently: Take 20 mg by mouth daily as needed (reflux). ) 90 capsule 4  . palbociclib (IBRANCE) 75 MG capsule Take 1 capsule (75 mg total) by mouth daily with breakfast. Take whole with food. (Patient taking differently: Take 75 mg by mouth 3 (three) times a week. Take every mon,wed,fri for 3 weeks, then take 1 week off.) 21 capsule 6  . Probiotic Product (PROBIOTIC DAILY PO) Take 1 tablet by mouth daily.     . simvastatin (ZOCOR) 5 MG tablet TAKE 1 TABLET BY MOUTH DAILY (Patient taking differently: TAKE 5 MG BY MOUTH DAILY) 90 tablet 1   No current facility-administered medications on file prior to visit.     BP 104/68 (BP Location: Left Arm, Patient Position: Sitting, Cuff Size: Normal)   Pulse 70   Temp 97.6 F (36.4 C) (Oral)   Ht 5\' 2"  (1.575 m)   Wt 124 lb 6.4 oz (56.4 kg)   SpO2 98%   BMI 22.75 kg/m      Review of Systems  Constitutional: Negative.   HENT: Negative for congestion, dental problem, hearing loss, rhinorrhea, sinus pressure, sore throat and tinnitus.   Eyes: Positive for visual disturbance. Negative for pain and discharge.  Respiratory: Negative for cough and shortness of breath.   Cardiovascular: Negative for chest pain, palpitations and leg swelling.  Gastrointestinal: Negative for abdominal distention, abdominal pain, blood in stool, constipation, diarrhea, nausea and vomiting.  Genitourinary: Negative for difficulty urinating, dysuria, flank pain, frequency, hematuria, pelvic pain, urgency, vaginal bleeding, vaginal discharge and vaginal pain.  Musculoskeletal: Negative for arthralgias, gait problem and joint swelling.  Skin: Negative for rash.  Neurological: Negative for dizziness,  syncope, speech difficulty, weakness, numbness and headaches.  Hematological: Negative for adenopathy.  Psychiatric/Behavioral: Negative for agitation, behavioral problems and dysphoric mood. The patient is nervous/anxious.        Objective:   Physical Exam  Constitutional: She is oriented to person, place, and time. She appears well-developed and well-nourished.  Anxious  No distress  Blood pressure low normal  HENT:  Head: Normocephalic.  Right Ear: External ear normal.  Left Ear: External ear normal.  Mouth/Throat: Oropharynx is clear and moist.  Eyes: Conjunctivae and EOM are normal. Pupils are equal, round, and reactive to light.  Neck: Normal range of motion. Neck supple. No thyromegaly present.  Cardiovascular: Normal rate, regular rhythm, normal heart sounds and intact distal pulses.   Pulmonary/Chest: Effort normal and breath sounds normal.  Abdominal: Soft. Bowel sounds are normal. She exhibits no mass. There is no tenderness.  Musculoskeletal: Normal range of motion.  Lymphadenopathy:    She has no cervical adenopathy.  Neurological: She is alert and oriented to person, place, and time.  Skin: Skin is warm and dry. No rash noted.  Psychiatric: She has a normal mood and affect. Her behavior is normal.          Assessment & Plan:   History of breast cancer Essential hypertension Status post acute diverticulitis.  Clinically resolved  We'll check follow-up lipid profile and TSH No change in medical regimen  Follow-up November as scheduled  [name]

## 2016-11-18 ENCOUNTER — Encounter (HOSPITAL_COMMUNITY): Payer: Self-pay | Admitting: Emergency Medicine

## 2016-11-18 ENCOUNTER — Ambulatory Visit (INDEPENDENT_AMBULATORY_CARE_PROVIDER_SITE_OTHER): Payer: Medicare Other

## 2016-11-18 ENCOUNTER — Ambulatory Visit (HOSPITAL_COMMUNITY)
Admission: EM | Admit: 2016-11-18 | Discharge: 2016-11-18 | Disposition: A | Discharge status: Home / Self Care | Payer: Medicare Other | Attending: Internal Medicine | Admitting: Internal Medicine

## 2016-11-18 DIAGNOSIS — K5900 Constipation, unspecified: Secondary | ICD-10-CM | POA: Diagnosis not present

## 2016-11-18 DIAGNOSIS — K5901 Slow transit constipation: Secondary | ICD-10-CM | POA: Diagnosis not present

## 2016-11-18 DIAGNOSIS — K5792 Diverticulitis of intestine, part unspecified, without perforation or abscess without bleeding: Secondary | ICD-10-CM | POA: Diagnosis not present

## 2016-11-18 NOTE — ED Provider Notes (Signed)
CSN: [id number]     Arrival date & time 11/18/16  1556 History   First MD Initiated Contact with Patient 11/18/16 1727     Chief Complaint  Patient presents with  . Constipation   (Consider location/radiation/quality/duration/timing/severity/associated sxs/prior Treatment) 76 year old female states that she ate some fruit that may been bad several days ago. This was followed by several episodes of diarrhea for which she took Imodium. Now for the past 3 days she has been constipated and unable to move any bowels. She is complaining of rectal pain due to straining and abdominal discomfort. She also has a history of diverticulitis but states this pain feels nothing like her previous diverticulitis episodes and is convinced that she is constipated only. She has mild to moderate abdominal discomfort. Unable to pass stool. She tried an oil enema retaining for about 20 minutes without results. She has also used her Colace without bowel movement.  The patient has a history of metastatic bone cancer, fatty liver, diverticulosis/diverticulitis, internal hemorrhoid, hypothyroidism, hemorrhoids, cerebral artery stenosis among others.      Past Medical History:  Diagnosis Date  . Allergy   . Anxiety   . 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 Cancer [facility]) 2007   right breat ca  , lumpectomy and radiation tx (declined chemo and additional prophylactic meds)  . CEREBROVASCULAR DISEASE 03/03/2010  . Diverticulitis   . DIVERTICULOSIS, COLON 03/03/2010  . Fatty liver 08/02/09   as per U/S done by [facility]  . Foramen ovale    positive bubble study  . Headache(784.0)    she thinks sinus headaches  . History of cerebral artery stenosis    right middle  . HYPERLIPIDEMIA 03/03/2010  . Hypertension   . HYPOTHYROIDISM 03/03/2010   no longer on meds  . Internal hemorrhoid   . Low back pain   . Mastoiditis    noted on brain MRI  . Rib fracture    . Right leg pain   . Shortness of breath   . Stroke [facility]) Oct. 2011   TIA  . Ulcer    Past Surgical History:  Procedure Laterality Date  . BREAST LUMPECTOMY     right  . CHOLECYSTECTOMY    . COLONOSCOPY    . SHOULDER SURGERY     right shoulder (dislocation)  . TONSILLECTOMY    . TUBAL LIGATION    . VIDEO BRONCHOSCOPY WITH ENDOBRONCHIAL ULTRASOUND N/A 01/07/2014   Procedure: VIDEO BRONCHOSCOPY WITH ENDOBRONCHIAL ULTRASOUND;  Surgeon: Ivin Poot, MD;  Location: [facility] OR;  Service: Thoracic;  Laterality: N/A;   Family History  Problem Relation Age of Onset  . Heart disease Sister        cerebral vascular disease also  . Heart disease Brother        cerebral vascular disease also  . Heart disease Brother        cerebral vascular disease  . Heart disease Sister        cebreal vascular disease also  . Heart disease Sister        cebreal vascular diease also  . Heart disease Sister        cerebral vascular diease also  . Stomach cancer Father   . Colon cancer Neg Hx   . Esophageal cancer Neg Hx   . Rectal cancer Neg Hx    Social History  Substance Use Topics  . Smoking status: Former Smoker    Packs/day: 0.50  Years: 20.00    Types: Cigarettes    Quit date: 05/01/1991  . Smokeless tobacco: Never Used  . Alcohol use No   OB History    Gravida Para Term Preterm AB Living   4 2           SAB TAB Ectopic Multiple Live Births                 Review of Systems  Constitutional: Positive for appetite change. Negative for activity change and fever.  HENT: Negative.   Respiratory: Negative.   Cardiovascular: Negative.   Gastrointestinal: Positive for abdominal pain and constipation. Negative for nausea and vomiting.  Genitourinary: Negative.   Musculoskeletal: Negative.   Neurological: Negative.   All other systems reviewed and are negative.   Allergies  Ciprofloxacin  Home Medications   Prior to Admission medications   Medication Sig Start Date End Date  Taking? Authorizing Provider  ALPRAZolam Duanne Moron) 0.25 MG tablet TAKE 1 TABLET BY MOUTH THREE TIMES DAILY AS NEEDED FOR ANXIETY 11/02/16   Marletta Lor, MD  aspirin EC 81 MG tablet Take 81 mg by mouth daily after breakfast.     [provider]  CARTIA XT 120 MG 24 hr capsule TAKE 1 CAPSULE BY MOUTH DAILY Patient taking differently: TAKE 120 MG BY MOUTH DAILY 10/12/16   Marletta Lor, MD  Cholecalciferol (VITAMIN D PO) Take 1,000 Units by mouth 2 (two) times daily.    [provider]  dicyclomine (BENTYL) 10 MG capsule Take 1 capsule (10 mg total) by mouth 4 (four) times daily -  before meals and at bedtime. Patient taking differently: Take 10 mg by mouth 4 (four) times daily as needed for spasms.  08/22/16   Marletta Lor, MD  docusate sodium (COLACE) 100 MG capsule Take 100 mg by mouth daily as needed for mild constipation.    [provider]  Flaxseed, Linseed, (FLAX SEEDS PO) Take 1 tablet by mouth daily.     [provider]  FULVESTRANT IM Inject into the muscle.    [provider]  Glucosamine 500 MG CAPS Take 500 mg by mouth daily.     [provider]  meclizine (ANTIVERT) 25 MG tablet TAKE 1 TABLET(25 MG) BY MOUTH EVERY 6 HOURS AS NEEDED FOR DIZZINESS 04/17/16   Marletta Lor, MD  omeprazole (PRILOSEC) 20 MG capsule Take 1 capsule (20 mg total) by mouth daily. Patient taking differently: Take 20 mg by mouth daily as needed (reflux).  07/12/16   Magrinat, Virgie Dad, MD  palbociclib Leslee Home) 75 MG capsule Take 1 capsule (75 mg total) by mouth daily with breakfast. Take whole with food. Patient taking differently: Take 75 mg by mouth 3 (three) times a week. Take every mon,wed,fri for 3 weeks, then take 1 week off. 10/04/16   Magrinat, Virgie Dad, MD  Probiotic Product (PROBIOTIC DAILY PO) Take 1 tablet by mouth daily.     [provider]  simvastatin (ZOCOR) 5 MG tablet TAKE 1 TABLET BY MOUTH DAILY Patient taking  differently: TAKE 5 MG BY MOUTH DAILY 08/15/16   Marletta Lor, MD   Meds Ordered and Administered this Visit  Medications - No data to display  BP 137/85 (BP Location: Left Arm)   Pulse 85   Temp 98 F (36.7 C) (Oral)   Resp 18   SpO2 96%  No data found.   Physical Exam  Constitutional: She is oriented to person, place, and time. She  appears well-developed. No distress.  Eyes: EOM are normal.  Neck: Neck supple.  Cardiovascular: Normal rate.   Pulmonary/Chest: Effort normal and breath sounds normal. No respiratory distress.  Abdominal: Soft. Bowel sounds are normal. She exhibits no mass. There is no rebound and no guarding.  Generalized tenderness. No tenderness to the left lower quadrant.  Musculoskeletal: She exhibits no edema.  Neurological: She is alert and oriented to person, place, and time. She exhibits normal muscle tone.  Skin: Skin is warm and dry.  Psychiatric: She has a normal mood and affect.  Nursing note and vitals reviewed.   Urgent Care Course     Procedures (including critical care time)  Labs Review Labs Reviewed - No data to display  Imaging Review Dg Abd 2 Views  Result Date: 11/18/2016 CLINICAL DATA:  Abdominal pain, constipation, recent diverticulitis EXAM: ABDOMEN - 2 VIEW COMPARISON:  CT abdomen/ pelvis dated 10/28/2016 FINDINGS: Nonobstructive bowel gas pattern. No evidence of free air under the diaphragm on the upright view. Moderate colonic stool burden. Cholecystectomy clips. Mild degenerative changes of the lumbar spine. IMPRESSION: Moderate colonic stool burden, suggesting constipation. No evidence of small bowel obstruction or free air. Electronically Signed   By: Julian Hy M.D.   On: 11/18/2016 18:04     Visual Acuity Review  Right Eye Distance:   Left Eye Distance:   Bilateral Distance:    Right Eye Near:   Left Eye Near:    Bilateral Near:         MDM   1. Slow transit constipation    Obtain a bottle of  MiraLAX. Place 1 capful which equals 17 g in 6 ounces of juice or water. Drink 1 glass full of this mixture every 30 minutes 3. If, in 6 hours you have not started to have bowel movements then drink another 2 glasses. If you are feeling the urge to have a bowel movement but it is difficult you may again use another enema. Follow up with your doctor next week if needed or not having sufficient results. If you develop increasing abdominal pain coated to the emergency department.     Janne Napoleon, NP 11/18/16 [phone]

## 2016-11-18 NOTE — Discharge Instructions (Signed)
Obtain a bottle of MiraLAX. Place 1 capful which equals 17 g in 6 ounces of juice or water. Drink 1 glass full of this mixture every 30 minutes 3. If, in 6 hours you have not started to have bowel movements then drink another 2 glasses. If you are feeling the urge to have a bowel movement but it is difficult you may again use another enema. Follow up with your doctor next week if needed or not having sufficient results. If you develop increasing abdominal pain coated to the emergency department.

## 2016-11-18 NOTE — ED Triage Notes (Signed)
History of constipation.  Last bm was 3 days ago.  Patient has tried otc -but no relief

## 2016-11-19 ENCOUNTER — Encounter (HOSPITAL_COMMUNITY): Payer: Self-pay | Admitting: *Deleted

## 2016-11-19 ENCOUNTER — Emergency Department (HOSPITAL_COMMUNITY): Payer: Medicare Other

## 2016-11-19 ENCOUNTER — Emergency Department (HOSPITAL_COMMUNITY)
Admission: EM | Admit: 2016-11-19 | Discharge: 2016-11-19 | Disposition: A | Discharge status: Home / Self Care | Payer: Medicare Other | Attending: Emergency Medicine | Admitting: Emergency Medicine

## 2016-11-19 DIAGNOSIS — E039 Hypothyroidism, unspecified: Secondary | ICD-10-CM | POA: Diagnosis not present

## 2016-11-19 DIAGNOSIS — Z8673 Personal history of transient ischemic attack (TIA), and cerebral infarction without residual deficits: Secondary | ICD-10-CM | POA: Insufficient documentation

## 2016-11-19 DIAGNOSIS — C50411 Malignant neoplasm of upper-outer quadrant of right female breast: Secondary | ICD-10-CM | POA: Insufficient documentation

## 2016-11-19 DIAGNOSIS — I10 Essential (primary) hypertension: Secondary | ICD-10-CM | POA: Diagnosis not present

## 2016-11-19 DIAGNOSIS — K5641 Fecal impaction: Secondary | ICD-10-CM | POA: Insufficient documentation

## 2016-11-19 DIAGNOSIS — Z7982 Long term (current) use of aspirin: Secondary | ICD-10-CM | POA: Insufficient documentation

## 2016-11-19 DIAGNOSIS — Z79899 Other long term (current) drug therapy: Secondary | ICD-10-CM | POA: Diagnosis not present

## 2016-11-19 DIAGNOSIS — R109 Unspecified abdominal pain: Secondary | ICD-10-CM | POA: Diagnosis not present

## 2016-11-19 DIAGNOSIS — R1030 Lower abdominal pain, unspecified: Secondary | ICD-10-CM | POA: Diagnosis present

## 2016-11-19 DIAGNOSIS — Z87891 Personal history of nicotine dependence: Secondary | ICD-10-CM | POA: Diagnosis not present

## 2016-11-19 LAB — CBC WITH DIFFERENTIAL/PLATELET
BASOS ABS: 0.1 10*3/uL (ref 0.0–0.1)
BASOS PCT: 1 %
EOS ABS: 0.1 10*3/uL (ref 0.0–0.7)
Eosinophils Relative: 3 %
HCT: 37.3 % (ref 36.0–46.0)
HEMOGLOBIN: 13 g/dL (ref 12.0–15.0)
LYMPHS ABS: 1.3 10*3/uL (ref 0.7–4.0)
Lymphocytes Relative: 37 %
MCH: 34.8 pg — AB (ref 26.0–34.0)
MCHC: 34.9 g/dL (ref 30.0–36.0)
MCV: 99.7 fL (ref 78.0–100.0)
Monocytes Absolute: 0.2 10*3/uL (ref 0.1–1.0)
Monocytes Relative: 7 %
NEUTROS PCT: 52 %
Neutro Abs: 1.9 10*3/uL (ref 1.7–7.7)
Platelets: 126 10*3/uL — ABNORMAL LOW (ref 150–400)
RBC: 3.74 MIL/uL — ABNORMAL LOW (ref 3.87–5.11)
RDW: 12.7 % (ref 11.5–15.5)
WBC: 3.6 10*3/uL — ABNORMAL LOW (ref 4.0–10.5)

## 2016-11-19 LAB — COMPREHENSIVE METABOLIC PANEL
ALT: 18 U/L (ref 14–54)
AST: 23 U/L (ref 15–41)
Albumin: 4 g/dL (ref 3.5–5.0)
Alkaline Phosphatase: 59 U/L (ref 38–126)
Anion gap: 9 (ref 5–15)
BUN: 8 mg/dL (ref 6–20)
CHLORIDE: 102 mmol/L (ref 101–111)
CO2: 27 mmol/L (ref 22–32)
CREATININE: 0.82 mg/dL (ref 0.44–1.00)
Calcium: 9.2 mg/dL (ref 8.9–10.3)
GFR calc non Af Amer: >60 mL/min (ref >60)
Glucose, Bld: 110 mg/dL — ABNORMAL HIGH (ref 65–99)
POTASSIUM: 3.6 mmol/L (ref 3.5–5.1)
Sodium: 138 mmol/L (ref 135–145)
Total Bilirubin: 1.1 mg/dL (ref 0.3–1.2)
Total Protein: 6.8 g/dL (ref 6.5–8.1)

## 2016-11-19 LAB — URINALYSIS, ROUTINE W REFLEX MICROSCOPIC
BILIRUBIN URINE: NEGATIVE
Bacteria, UA: NONE SEEN
GLUCOSE, UA: NEGATIVE mg/dL
HGB URINE DIPSTICK: NEGATIVE
KETONES UR: NEGATIVE mg/dL
NITRITE: NEGATIVE
PROTEIN: NEGATIVE mg/dL
Specific Gravity, Urine: 1.003 — ABNORMAL LOW (ref 1.005–1.030)
Squamous Epithelial / LPF: NONE SEEN
pH: 7 (ref 5.0–8.0)

## 2016-11-19 LAB — LIPASE, BLOOD: LIPASE: 19 U/L (ref 11–51)

## 2016-11-19 MED ORDER — MINERAL OIL RE ENEM
1.0000 | ENEMA | Freq: Once | RECTAL | Status: AC
  Administered 2016-11-19: 1 via RECTAL
  Filled 2016-11-19: qty 1

## 2016-11-19 MED ORDER — FLEET ENEMA 7-19 GM/118ML RE ENEM
1.0000 | ENEMA | Freq: Once | RECTAL | Status: AC
Start: 2016-11-19 — End: 2016-11-19
  Administered 2016-11-19: 1 via RECTAL
  Filled 2016-11-19: qty 1

## 2016-11-19 MED ORDER — IOPAMIDOL (ISOVUE-300) INJECTION 61%
100.0000 mL | Freq: Once | INTRAVENOUS | Status: AC | PRN
  Administered 2016-11-19: 100 mL via INTRAVENOUS

## 2016-11-19 NOTE — ED Notes (Addendum)
Patients husband at tech first desk asking about wait time.  Expained to husband that patients are seen based on acuity and number of EMS arrivals in the hospital.  Assured patient and husband we are doing everything we can to get them treated.  Patient appears in distress and walking around the lobby bent over. Will continue to monitor and updated patient.

## 2016-11-19 NOTE — ED Notes (Signed)
Pt back from CT. No distress observed. 

## 2016-11-19 NOTE — ED Triage Notes (Signed)
Pt was seen at [facility] yesterday and diagnosed with constipation and had xray that showed a lot of stool.  Took miralax and enema and no relief or results.  No vomiting.  Complains of intermittent abdominal pain with sense to go and states passing flatus some.

## 2016-11-19 NOTE — ED Provider Notes (Signed)
[facility] DEPT Provider Note   CSN: [id number] Arrival date & time: 11/19/16  9562     History   Chief Complaint Chief Complaint  Patient presents with  . Abdominal Pain  . Constipation    HPI Erica Young is a 76 y.o. female.  HPI Patient recently treated for diverticulitis. Presents with watery diarrhea starting 4 days ago. Had multiple episodes. Also admits to abdominal cramping. Took medication for the diarrhea and does not have a bowel movement since. Says she's having increase rectal pressure and pain as well as diffuse abdominal pain. She has chills but no fever. No nausea or vomiting. Was seen yesterday at urgent care start on MiraLAX. She also has used enemas and suppositories without improvement. Past Medical History:  Diagnosis Date  . Allergy   . Anxiety   . 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 Cancer [facility]) 2007   right breat ca  , lumpectomy and radiation tx (declined chemo and additional prophylactic meds)  . CEREBROVASCULAR DISEASE 03/03/2010  . Diverticulitis   . DIVERTICULOSIS, COLON 03/03/2010  . Fatty liver 08/02/09   as per U/S done by [facility]  . Foramen ovale    positive bubble study  . Headache(784.0)    she thinks sinus headaches  . History of cerebral artery stenosis    right middle  . HYPERLIPIDEMIA 03/03/2010  . Hypertension   . HYPOTHYROIDISM 03/03/2010   no longer on meds  . Internal hemorrhoid   . Low back pain   . Mastoiditis    noted on brain MRI  . Rib fracture   . Right leg pain   . Shortness of breath   . Stroke [facility]) Oct. 2011   TIA  . Ulcer     Patient Active Problem List   Diagnosis Date Noted  . Malignant neoplasm of upper-outer quadrant of right breast in female, estrogen receptor positive ([place]) 01/19/2016  . Bone metastases ([place]) 11/22/2014  . Rhinitis, allergic 02/12/2014  . Cancer of right breast, stage 4 ([place]) 01/23/2014  . History of breast  cancer 01/11/2014  . Chronic rhinitis 09/17/2013  . Hilar adenopathy 05/12/2013  . Abdominal pain 05/07/2011  . Hypertension 06/09/2010  . BACK PAIN 04/10/2010  . Dyslipidemia 03/03/2010  . Cerebrovascular disease 03/03/2010  . DIVERTICULOSIS, COLON 03/03/2010    Past Surgical History:  Procedure Laterality Date  . BREAST LUMPECTOMY     right  . CHOLECYSTECTOMY    . COLONOSCOPY    . SHOULDER SURGERY     right shoulder (dislocation)  . TONSILLECTOMY    . TUBAL LIGATION    . VIDEO BRONCHOSCOPY WITH ENDOBRONCHIAL ULTRASOUND N/A 01/07/2014   Procedure: VIDEO BRONCHOSCOPY WITH ENDOBRONCHIAL ULTRASOUND;  Surgeon: Ivin Poot, MD;  Location: [facility] OR;  Service: Thoracic;  Laterality: N/A;    OB History    Gravida Para Term Preterm AB Living   4 2           SAB TAB Ectopic Multiple Live Births                   Home Medications    Prior to Admission medications   Medication Sig Start Date End Date Taking? Authorizing Provider  ALPRAZolam (XANAX) 0.25 MG tablet TAKE 1 TABLET BY MOUTH THREE TIMES DAILY AS NEEDED FOR ANXIETY Patient taking differently: TAKE 0.25MG  BY MOUTH THREE TIMES DAILY 11/02/16  Yes Marletta Lor, MD  aspirin EC 81 MG tablet  Take 81 mg by mouth daily after breakfast.    Yes [provider]  CARTIA XT 120 MG 24 hr capsule TAKE 1 CAPSULE BY MOUTH DAILY Patient taking differently: TAKE 120 MG BY MOUTH DAILY 10/12/16  Yes Marletta Lor, MD  Cholecalciferol (VITAMIN D PO) Take 1,000 Units by mouth 2 (two) times daily.   Yes [provider]  dicyclomine (BENTYL) 10 MG capsule Take 1 capsule (10 mg total) by mouth 4 (four) times daily -  before meals and at bedtime. Patient taking differently: Take 10 mg by mouth 4 (four) times daily.  08/22/16  Yes Marletta Lor, MD  docusate sodium (COLACE) 100 MG capsule Take 100 mg by mouth daily as needed for mild constipation.   Yes [provider]  Flaxseed, Linseed, (FLAX SEEDS PO)  Take 1 tablet by mouth daily.    Yes [provider]  FULVESTRANT IM Inject into the muscle.   Yes [provider]  Glucosamine 500 MG CAPS Take 500 mg by mouth daily.    Yes [provider]  meclizine (ANTIVERT) 25 MG tablet TAKE 1 TABLET(25 MG) BY MOUTH EVERY 6 HOURS AS NEEDED FOR DIZZINESS 04/17/16  Yes Marletta Lor, MD  omeprazole (PRILOSEC) 20 MG capsule Take 1 capsule (20 mg total) by mouth daily. Patient taking differently: Take 20 mg by mouth daily as needed (reflux).  07/12/16  Yes Magrinat, Virgie Dad, MD  palbociclib [facility]) 75 MG capsule Take 1 capsule (75 mg total) by mouth daily with breakfast. Take whole with food. Patient taking differently: Take 75 mg by mouth 3 (three) times a week. Take every mon,wed,fri for 3 weeks, then take 1 week off. 10/04/16  Yes Magrinat, Virgie Dad, MD  Polyethylene Glycol 3350 (MIRALAX PO) Take 17 g by mouth every 30 (thirty) minutes as needed (constipation).   Yes [provider]  Probiotic Product (PROBIOTIC DAILY PO) Take 1 tablet by mouth daily.    Yes [provider]  simvastatin (ZOCOR) 5 MG tablet TAKE 1 TABLET BY MOUTH DAILY Patient taking differently: TAKE 5 MG BY MOUTH DAILY 08/15/16  Yes Marletta Lor, MD    Family History Family History  Problem Relation Age of Onset  . Heart disease Sister        cerebral vascular disease also  . Heart disease Brother        cerebral vascular disease also  . Heart disease Brother        cerebral vascular disease  . Heart disease Sister        cebreal vascular disease also  . Heart disease Sister        cebreal vascular diease also  . Heart disease Sister        cerebral vascular diease also  . Stomach cancer Father   . Colon cancer Neg Hx   . Esophageal cancer Neg Hx   . Rectal cancer Neg Hx     Social History Social History  Substance Use Topics  . Smoking status: Former Smoker    Packs/day: 0.50    Years: 20.00    Types:  Cigarettes    Quit date: 05/01/1991  . Smokeless tobacco: Never Used  . Alcohol use No     Allergies   Ciprofloxacin   Review of Systems Review of Systems  Constitutional: Positive for chills. Negative for appetite change, fatigue and fever.  Eyes: Negative for photophobia and visual disturbance.  Respiratory: Negative for cough and shortness of breath.  Cardiovascular: Negative for chest pain, palpitations and leg swelling.  Gastrointestinal: Positive for abdominal pain, blood in stool and constipation. Negative for diarrhea, nausea and vomiting.  Genitourinary: Negative for dysuria, flank pain, frequency and hematuria.  Musculoskeletal: Negative for back pain, myalgias, neck pain and neck stiffness.  Skin: Negative for rash and wound.  Neurological: Negative for dizziness, weakness, light-headedness, numbness and headaches.  All other systems reviewed and are negative.    Physical Exam Updated Vital Signs BP (!) 144/91   Pulse 84   Temp 98.2 F (36.8 C) (Oral)   Resp 17   SpO2 93%   Physical Exam  Constitutional: She is oriented to person, place, and time. She appears well-developed and well-nourished. No distress.  HENT:  Head: Normocephalic and atraumatic.  Mouth/Throat: Oropharynx is clear and moist. No oropharyngeal exudate.  Eyes: Pupils are equal, round, and reactive to light. EOM are normal.  Neck: Normal range of motion. Neck supple.  Cardiovascular: Normal rate and regular rhythm.  Exam reveals no gallop and no friction rub.   No murmur heard. Pulmonary/Chest: Effort normal and breath sounds normal. No respiratory distress. She has no wheezes. She has no rales. She exhibits no tenderness.  Abdominal: Soft. Bowel sounds are normal. She exhibits distension. There is no tenderness. There is no rebound and no guarding.  Diffuse abdominal tenderness. Appears most pronounced in the periumbilical region. No rebound or guarding. Bowel sounds throughout.    Genitourinary:  Genitourinary Comments: Impacted stool.  Musculoskeletal: Normal range of motion. She exhibits no edema or tenderness.  No CVA tenderness to percussion. No midline thoracic or lumbar tenderness.  Lymphadenopathy:    She has no cervical adenopathy.  Neurological: She is alert and oriented to person, place, and time.  Moving all extremities without focal deficit. Sensation fully intact.  Skin: Skin is warm and dry. Capillary refill takes less than 2 seconds. No rash noted. She is not diaphoretic. No erythema.  Psychiatric: She has a normal mood and affect. Her behavior is normal.  Nursing note and vitals reviewed.    ED Treatments / Results  Labs (all labs ordered are listed, but only abnormal results are displayed) Labs Reviewed  COMPREHENSIVE METABOLIC PANEL - Abnormal; Notable for the following:       Result Value   Glucose, Bld 110 (*)    All other components within normal limits  CBC WITH DIFFERENTIAL/PLATELET - Abnormal; Notable for the following:    WBC 3.6 (*)    RBC 3.74 (*)    MCH 34.8 (*)    Platelets 126 (*)    All other components within normal limits  URINALYSIS, ROUTINE W REFLEX MICROSCOPIC - Abnormal; Notable for the following:    Color, Urine STRAW (*)    Specific Gravity, Urine 1.003 (*)    Leukocytes, UA TRACE (*)    All other components within normal limits  LIPASE, BLOOD    EKG  EKG Interpretation None       Radiology Ct Abdomen Pelvis W Contrast  Result Date: 11/19/2016 CLINICAL DATA:  Constipation for several days a with abdominal pain EXAM: CT ABDOMEN AND PELVIS WITH CONTRAST TECHNIQUE: Multidetector CT imaging of the abdomen and pelvis was performed using the standard protocol following bolus administration of intravenous contrast. CONTRAST:  11mL ISOVUE-300 IOPAMIDOL (ISOVUE-300) INJECTION 61% COMPARISON:  10/27/2016, 11/18/2016 FINDINGS: Lower chest: Lung bases are well aerated. Focal nodules are again seen in the right lower  lobe unchanged from the recent exam. Left nipple retraction is again  identified and stable. Hepatobiliary: Changes of prior cholecystectomy are noted. Fatty infiltration of the liver is seen. Pancreas: Unremarkable. No pancreatic ductal dilatation or surrounding inflammatory changes. Spleen: Normal in size without focal abnormality. Adrenals/Urinary Tract: Cystic lesions are again noted in the right kidney. No obstructive changes are seen. The bladder is well distended. No renal calculi are noted. Stomach/Bowel: Scattered diverticular change of the colon is noted. No changes of diverticulitis are seen. Mild to moderate retained fecal material is noted similar to that seen on prior plain film consistent with the given clinical history of constipation. No obstructive or inflammatory changes are seen. The appendix is within normal limits. The previously seen thickening in the rectosigmoid is not well seen on today's exam. Vascular/Lymphatic: Aortic atherosclerosis. No enlarged abdominal or pelvic lymph nodes. Reproductive: Uterus and bilateral adnexa are unremarkable. Other: No abdominal wall hernia or abnormality. No abdominopelvic ascites. Musculoskeletal: Degenerative changes of the lumbar spine are again seen. Stable changes are also noted in the thoracic and lumbar spine consistent with metastatic disease. This is stable from the previous exam. IMPRESSION: Stable right lower lobe nodule when compared with the prior exam. Changes of known bony metastatic disease. Resolution of previously seen rectosigmoid thickening. Chronic changes as described above without acute abnormality. No significant change from the prior exam is seen. Electronically Signed   By: Inez Catalina M.D.   On: 11/19/2016 19:30   Dg Abd 2 Views  Result Date: 11/18/2016 CLINICAL DATA:  Abdominal pain, constipation, recent diverticulitis EXAM: ABDOMEN - 2 VIEW COMPARISON:  CT abdomen/ pelvis dated 10/28/2016 FINDINGS: Nonobstructive bowel gas  pattern. No evidence of free air under the diaphragm on the upright view. Moderate colonic stool burden. Cholecystectomy clips. Mild degenerative changes of the lumbar spine. IMPRESSION: Moderate colonic stool burden, suggesting constipation. No evidence of small bowel obstruction or free air. Electronically Signed   By: Julian Hy M.D.   On: 11/18/2016 18:04    Procedures Fecal disimpaction Date/Time: 11/19/2016 11:03 PM Performed by: Julianne Rice Authorized by: Lita Mains, Rita Prom  Consent: Verbal consent obtained. Consent given by: patient  Sedation: Patient sedated: no Patient tolerance: Patient tolerated the procedure well with no immediate complications Comments: Moderate amount of hard stool was removed.    (including critical care time)  Medications Ordered in ED Medications  iopamidol (ISOVUE-300) 61 % injection 100 mL (100 mLs Intravenous Contrast Given 11/19/16 1903)  mineral oil enema 1 enema (1 enema Rectal Given 11/19/16 2115)  sodium phosphate (FLEET) 7-19 GM/118ML enema 1 enema (1 enema Rectal Given 11/19/16 2248)     Initial Impression / Assessment and Plan / ED Course  I have reviewed the triage vital signs and the nursing notes.  Pertinent labs & imaging results that were available during my care of the patient were reviewed by me and considered in my medical decision making (see chart for details).    Patient disimpacted in the emergency department and given enema. Advised to continue MiraLAX and follow-up with gastroenterology as needed.   Final Clinical Impressions(s) / ED Diagnoses   Final diagnoses:  Fecal impaction in rectum [facility])    New Prescriptions New Prescriptions   No medications on file     Julianne Rice, MD 11/19/16 2313

## 2016-11-19 NOTE — ED Notes (Signed)
Pt up to bathroom with assistance 

## 2016-11-19 NOTE — ED Notes (Signed)
Pt at CT

## 2016-11-19 NOTE — ED Notes (Signed)
EDP at bedside  

## 2016-11-19 NOTE — ED Notes (Signed)
Pt ambulatory to restroom with independent steady gait °

## 2016-11-19 NOTE — Discharge Instructions (Signed)
Continue MiraLAX as prescribed. Follow-up with gastroenterology as needed.

## 2016-11-19 NOTE — ED Notes (Signed)
ED Provider at bedside. 

## 2016-11-19 NOTE — ED Notes (Signed)
Megan Engineer, civil (consulting)) attempted to take patient back to triage to draw blood.  Patient upset that blood wasn't drawn at time of arrival.  Patient refused blood draw at this time.  Megan reassured patient we are doing everything we can to treat her and drawing blood for lab work is part of the treatment.

## 2016-11-19 NOTE — ED Notes (Signed)
Pt still refusing blood draw until seen by MD.

## 2016-11-20 ENCOUNTER — Ambulatory Visit: Payer: Medicare Other | Admitting: Internal Medicine

## 2016-11-20 ENCOUNTER — Other Ambulatory Visit: Payer: Self-pay

## 2016-11-21 DIAGNOSIS — H2512 Age-related nuclear cataract, left eye: Secondary | ICD-10-CM | POA: Diagnosis not present

## 2016-11-21 DIAGNOSIS — H25812 Combined forms of age-related cataract, left eye: Secondary | ICD-10-CM | POA: Diagnosis not present

## 2016-11-26 ENCOUNTER — Other Ambulatory Visit: Payer: Self-pay | Admitting: Internal Medicine

## 2016-11-26 DIAGNOSIS — R194 Change in bowel habit: Secondary | ICD-10-CM | POA: Diagnosis not present

## 2016-11-26 DIAGNOSIS — K9289 Other specified diseases of the digestive system: Secondary | ICD-10-CM | POA: Diagnosis not present

## 2016-11-26 DIAGNOSIS — K219 Gastro-esophageal reflux disease without esophagitis: Secondary | ICD-10-CM | POA: Diagnosis not present

## 2016-11-29 ENCOUNTER — Ambulatory Visit (HOSPITAL_BASED_OUTPATIENT_CLINIC_OR_DEPARTMENT_OTHER): Payer: Medicare Other

## 2016-11-29 ENCOUNTER — Other Ambulatory Visit (HOSPITAL_BASED_OUTPATIENT_CLINIC_OR_DEPARTMENT_OTHER): Payer: Medicare Other

## 2016-11-29 VITALS — BP 139/64 | HR 65 | Temp 98.2°F | Resp 16

## 2016-11-29 DIAGNOSIS — C50911 Malignant neoplasm of unspecified site of right female breast: Secondary | ICD-10-CM

## 2016-11-29 DIAGNOSIS — C50411 Malignant neoplasm of upper-outer quadrant of right female breast: Secondary | ICD-10-CM

## 2016-11-29 DIAGNOSIS — Z5111 Encounter for antineoplastic chemotherapy: Secondary | ICD-10-CM

## 2016-11-29 DIAGNOSIS — C7951 Secondary malignant neoplasm of bone: Secondary | ICD-10-CM

## 2016-11-29 DIAGNOSIS — Z853 Personal history of malignant neoplasm of breast: Secondary | ICD-10-CM

## 2016-11-29 LAB — CBC WITH DIFFERENTIAL/PLATELET
BASO%: 1.3 % (ref 0.0–2.0)
BASOS ABS: 0 10*3/uL (ref 0.0–0.1)
EOS ABS: 0.1 10*3/uL (ref 0.0–0.5)
EOS%: 3.5 % (ref 0.0–7.0)
HEMATOCRIT: 36.7 % (ref 34.8–46.6)
HGB: 12.6 g/dL (ref 11.6–15.9)
LYMPH#: 1.1 10*3/uL (ref 0.9–3.3)
LYMPH%: 39.1 % (ref 14.0–49.7)
MCH: 36.4 pg — AB (ref 25.1–34.0)
MCHC: 34.3 g/dL (ref 31.5–36.0)
MCV: 106.2 fL — ABNORMAL HIGH (ref 79.5–101.0)
MONO#: 0.2 10*3/uL (ref 0.1–0.9)
MONO%: 6.1 % (ref 0.0–14.0)
NEUT#: 1.4 10*3/uL — ABNORMAL LOW (ref 1.5–6.5)
NEUT%: 50 % (ref 38.4–76.8)
PLATELETS: 163 10*3/uL (ref 145–400)
RBC: 3.46 10*6/uL — AB (ref 3.70–5.45)
RDW: 13.6 % (ref 11.2–14.5)
WBC: 2.9 10*3/uL — ABNORMAL LOW (ref 3.9–10.3)

## 2016-11-29 LAB — COMPREHENSIVE METABOLIC PANEL
ALBUMIN: 3.8 g/dL (ref 3.5–5.0)
ALK PHOS: 64 U/L (ref 40–150)
ALT: 14 U/L (ref 0–55)
AST: 17 U/L (ref 5–34)
Anion Gap: 6 mEq/L (ref 3–11)
BUN: 11.6 mg/dL (ref 7.0–26.0)
CHLORIDE: 102 meq/L (ref 98–109)
CO2: 30 meq/L — AB (ref 22–29)
Calcium: 9.3 mg/dL (ref 8.4–10.4)
Creatinine: 0.8 mg/dL (ref 0.6–1.1)
EGFR: 69 mL/min/{1.73_m2} — AB (ref >90)
GLUCOSE: 88 mg/dL (ref 70–140)
POTASSIUM: 4.1 meq/L (ref 3.5–5.1)
SODIUM: 138 meq/L (ref 136–145)
Total Bilirubin: 0.46 mg/dL (ref 0.20–1.20)
Total Protein: 6.9 g/dL (ref 6.4–8.3)

## 2016-11-29 MED ORDER — FULVESTRANT 250 MG/5ML IM SOLN
500.0000 mg | Freq: Once | INTRAMUSCULAR | Status: AC
  Administered 2016-11-29: 500 mg via INTRAMUSCULAR
  Filled 2016-11-29: qty 10

## 2016-11-30 LAB — CANCER ANTIGEN 27.29: CAN 27.29: 46.2 U/mL — AB (ref 0.0–38.6)

## 2016-12-03 ENCOUNTER — Other Ambulatory Visit: Payer: Self-pay

## 2016-12-07 MED FILL — IBRANCE 75 MG CAPSULE: 75 | 28 days supply | Qty: 21 | Fill #1

## 2016-12-14 ENCOUNTER — Ambulatory Visit (INDEPENDENT_AMBULATORY_CARE_PROVIDER_SITE_OTHER): Payer: Medicare Other | Admitting: Gastroenterology

## 2016-12-14 ENCOUNTER — Encounter: Payer: Self-pay | Admitting: Gastroenterology

## 2016-12-14 VITALS — BP 160/98 | HR 84 | Ht 62.0 in | Wt 120.0 lb

## 2016-12-14 DIAGNOSIS — Z8719 Personal history of other diseases of the digestive system: Secondary | ICD-10-CM

## 2016-12-14 DIAGNOSIS — R938 Abnormal findings on diagnostic imaging of other specified body structures: Secondary | ICD-10-CM | POA: Diagnosis not present

## 2016-12-14 DIAGNOSIS — K589 Irritable bowel syndrome without diarrhea: Secondary | ICD-10-CM | POA: Diagnosis not present

## 2016-12-14 DIAGNOSIS — R9389 Abnormal findings on diagnostic imaging of other specified body structures: Secondary | ICD-10-CM

## 2016-12-14 DIAGNOSIS — R140 Abdominal distension (gaseous): Secondary | ICD-10-CM

## 2016-12-14 MED ORDER — VSL#3 PO CAPS: ORAL_CAPSULE | ORAL | 0 refills | Status: DC

## 2016-12-14 NOTE — Patient Instructions (Addendum)
If you are age 76 or older, your body mass index should be between 23-30. Your Body mass index is 21.95 kg/m. If this is out of the aforementioned range listed, please consider follow up with your Primary Care Provider.  If you are age 95 or younger, your body mass index should be between 19-25. Your Body mass index is 21.95 kg/m. If this is out of the aformentioned range listed, please consider follow up with your Primary Care Provider.   We have sent the following medications to your pharmacy for you to pick up at your convenience:  VSL #3 -take twice daily for 2 weeks  You have been given a Low FodMap to follow.  Discontinue Metamucil  Discontinue Bentyl (Dicyclomine)  You can take Miralax 1-2 times per day.  You can use Beno as needed.  Please follow up with Dr. Havery Moros in 2 months. This is scheduled 02/13/17 at 10:30. You will need to arrive at 10:15 to check in . If this appointment date and time does not fit your schedule please call [phone].  Thank you.

## 2016-12-14 NOTE — Progress Notes (Signed)
HPI :  76 y/o female with a history of stage IV breast cancer on chemotherapy, diverticular disease, IBS, history of TIA, referred by Dr. Lurline Del for consultation regarding chronic bowel symptoms, CT scan findings.   Patient has not been seen since 04/2013, former patient of Dr. Olevia Perches. Patient had a recent treatment for diverticulitis - CT scan showed diverticulitis of the rectosigmoid colon on 7/1. Spine lesions also noted c/w metastatic disease. She had a follow up ER visit on 11/19/16 for constipation / abdominal pain, had another CT showing resolution of thickening of the rectosigmoid colon. She had a fecal impaction and disimpacted by ER staff. She got impacted from immodium use per patient report.  She reports her bowel habits have been irregular for a long time. She takes colace daily. She has anywhere from no bowel movements to 3 times per day. She is trying to manage this with diet and flax seeds. She has pain / cramps with prune juice. She has tried metamucil once but has not used it recently. No blood in the stools. She has a lot of bloating which has bothered her over the years, chronic. Most recently she was treated with antibiotics for diverticulitis and took away her symptoms. She had abdominal pain but no fevers. She thinks she has had  diverticulitis more than 5 times over the years. She has not had benefit with dicyclomine. She is using Beano with meals for bloating / gas which has helped her. She has a lot of gas and bloating which has bothered her. She has been taking [place] previously.   CT scan in 2016 with thickening of the sigmoid colon.  Breast cancer is responding to therapy per patient.   She reports a history of reflux over time. She is taking omeprazole 20mg  PRN. She is not having much reflux.   Endoscopic history: EGD 03/17/2013 - 3cm hiatal hernia, some mild antral erythema Colonoscopy 03/15/2012 - pancolonic diverticulosis, one small polyp - benign, no  adenomas Colonoscopy 05/20/2008 - "severe" diverticulosis  Past Medical History:  Diagnosis Date  . Allergy   . Anxiety   . Bone cancer ([place]) mri 11/11/14   right parietal bone,left cribriform plate metastases  . Cancer [facility]) 2007   right breat ca  , lumpectomy and radiation tx (declined chemo and additional prophylactic meds)  . CEREBROVASCULAR DISEASE 03/03/2010  . Diverticulitis   . DIVERTICULOSIS, COLON 03/03/2010  . Fatty liver 08/02/09   as per U/S done by [facility]  . Foramen ovale    positive bubble study  . Headache(784.0)    she thinks sinus headaches  . History of cerebral artery stenosis    right middle  . HYPERLIPIDEMIA 03/03/2010  . Hypertension   . HYPOTHYROIDISM 03/03/2010   no longer on meds  . Internal hemorrhoid   . Low back pain   . Mastoiditis    noted on brain MRI  . Rib fracture   . Right leg pain   . Shortness of breath   . Stroke [facility] Oct. 2011   TIA  . Ulcer      Past Surgical History:  Procedure Laterality Date  . BREAST LUMPECTOMY     right  . CATARACT EXTRACTION Left   . CHOLECYSTECTOMY    . COLONOSCOPY    . SHOULDER SURGERY     right shoulder (dislocation)  . TONSILLECTOMY    . TUBAL LIGATION    . VIDEO BRONCHOSCOPY WITH ENDOBRONCHIAL ULTRASOUND N/A 01/07/2014   Procedure: VIDEO  BRONCHOSCOPY WITH ENDOBRONCHIAL ULTRASOUND;  Surgeon: Ivin Poot, MD;  Location: [facility] OR;  Service: Thoracic;  Laterality: N/A;   Family History  Problem Relation Age of Onset  . Heart disease Sister        cerebral vascular disease also  . Heart disease Brother        cerebral vascular disease also  . Heart disease Brother        cerebral vascular disease  . Heart disease Sister        cebreal vascular disease also  . Heart disease Sister        cebreal vascular diease also  . Heart disease Sister        cerebral vascular diease also  . Stomach cancer Father   . Colon cancer Neg Hx   . Esophageal cancer Neg Hx   . Rectal cancer Neg  Hx    Social History  Substance Use Topics  . Smoking status: Former Smoker    Packs/day: 0.50    Years: 20.00    Types: Cigarettes    Quit date: 05/01/1991  . Smokeless tobacco: Never Used  . Alcohol use No   Current Outpatient Prescriptions  Medication Sig Dispense Refill  . ALPRAZolam (XANAX) 0.25 MG tablet TAKE 1 TABLET BY MOUTH THREE TIMES DAILY AS NEEDED FOR ANXIETY 90 tablet 0  . aspirin EC 81 MG tablet Take 81 mg by mouth daily after breakfast.     . CARTIA XT 120 MG 24 hr capsule TAKE 1 CAPSULE BY MOUTH DAILY (Patient taking differently: TAKE 120 MG BY MOUTH DAILY) 90 capsule 1  . Cholecalciferol (VITAMIN D PO) Take 1,000 Units by mouth 2 (two) times daily.    Marland Kitchen dicyclomine (BENTYL) 10 MG capsule Take 1 capsule (10 mg total) by mouth 4 (four) times daily -  before meals and at bedtime. (Patient taking differently: Take 10 mg by mouth 4 (four) times daily. ) 60 capsule 2  . docusate sodium (COLACE) 100 MG capsule Take 100 mg by mouth daily as needed for mild constipation.    . Flaxseed, Linseed, (FLAX SEEDS PO) Take 1 tablet by mouth daily.     . FULVESTRANT IM Inject into the muscle.    . Glucosamine 500 MG CAPS Take 500 mg by mouth daily.     . meclizine (ANTIVERT) 25 MG tablet TAKE 1 TABLET(25 MG) BY MOUTH EVERY 6 HOURS AS NEEDED FOR DIZZINESS 60 tablet 0  . omeprazole (PRILOSEC) 20 MG capsule Take 1 capsule (20 mg total) by mouth daily. (Patient taking differently: Take 20 mg by mouth daily as needed (reflux). ) 90 capsule 4  . OVER THE COUNTER MEDICATION Beno for gas, takes twice daily    . palbociclib (IBRANCE) 75 MG capsule Take 1 capsule (75 mg total) by mouth daily with breakfast. Take whole with food. (Patient taking differently: Take 75 mg by mouth 3 (three) times a week. Take every mon,wed,fri for 3 weeks, then take 1 week off.) 21 capsule 6  . Probiotic Product (PROBIOTIC DAILY PO) Take 1 tablet by mouth daily.     . simvastatin (ZOCOR) 5 MG tablet TAKE 1 TABLET BY  MOUTH DAILY (Patient taking differently: TAKE 5 MG BY MOUTH DAILY) 90 tablet 1   No current facility-administered medications for this visit.    Allergies  Allergen Reactions  . Ciprofloxacin Anaphylaxis     Review of Systems: All systems reviewed and negative except where noted in HPI.    Ct Abdomen Pelvis  W Contrast  Result Date: 11/19/2016 CLINICAL DATA:  Constipation for several days a with abdominal pain EXAM: CT ABDOMEN AND PELVIS WITH CONTRAST TECHNIQUE: Multidetector CT imaging of the abdomen and pelvis was performed using the standard protocol following bolus administration of intravenous contrast. CONTRAST:  170mL ISOVUE-300 IOPAMIDOL (ISOVUE-300) INJECTION 61% COMPARISON:  10/27/2016, 11/18/2016 FINDINGS: Lower chest: Lung bases are well aerated. Focal nodules are again seen in the right lower lobe unchanged from the recent exam. Left nipple retraction is again identified and stable. Hepatobiliary: Changes of prior cholecystectomy are noted. Fatty infiltration of the liver is seen. Pancreas: Unremarkable. No pancreatic ductal dilatation or surrounding inflammatory changes. Spleen: Normal in size without focal abnormality. Adrenals/Urinary Tract: Cystic lesions are again noted in the right kidney. No obstructive changes are seen. The bladder is well distended. No renal calculi are noted. Stomach/Bowel: Scattered diverticular change of the colon is noted. No changes of diverticulitis are seen. Mild to moderate retained fecal material is noted similar to that seen on prior plain film consistent with the given clinical history of constipation. No obstructive or inflammatory changes are seen. The appendix is within normal limits. The previously seen thickening in the rectosigmoid is not well seen on today's exam. Vascular/Lymphatic: Aortic atherosclerosis. No enlarged abdominal or pelvic lymph nodes. Reproductive: Uterus and bilateral adnexa are unremarkable. Other: No abdominal wall hernia or  abnormality. No abdominopelvic ascites. Musculoskeletal: Degenerative changes of the lumbar spine are again seen. Stable changes are also noted in the thoracic and lumbar spine consistent with metastatic disease. This is stable from the previous exam. IMPRESSION: Stable right lower lobe nodule when compared with the prior exam. Changes of known bony metastatic disease. Resolution of previously seen rectosigmoid thickening. Chronic changes as described above without acute abnormality. No significant change from the prior exam is seen. Electronically Signed   By: Inez Catalina M.D.   On: 11/19/2016 19:30   Dg Abd 2 Views  Result Date: 11/18/2016 CLINICAL DATA:  Abdominal pain, constipation, recent diverticulitis EXAM: ABDOMEN - 2 VIEW COMPARISON:  CT abdomen/ pelvis dated 10/28/2016 FINDINGS: Nonobstructive bowel gas pattern. No evidence of free air under the diaphragm on the upright view. Moderate colonic stool burden. Cholecystectomy clips. Mild degenerative changes of the lumbar spine. IMPRESSION: Moderate colonic stool burden, suggesting constipation. No evidence of small bowel obstruction or free air. Electronically Signed   By: Julian Hy M.D.   On: 11/18/2016 18:04   Lab Results  Component Value Date   WBC 2.9 (L) 11/29/2016   HGB 12.6 11/29/2016   HCT 36.7 11/29/2016   MCV 106.2 (H) 11/29/2016   PLT 163 11/29/2016    Lab Results  Component Value Date   CREATININE 0.8 11/29/2016   BUN 11.6 11/29/2016   NA 138 11/29/2016   K 4.1 11/29/2016   CL 102 11/19/2016   CO2 30 (H) 11/29/2016    Lab Results  Component Value Date   ALT 14 11/29/2016   AST 17 11/29/2016   ALKPHOS 64 11/29/2016   BILITOT 0.46 11/29/2016      Physical Exam: BP (!) 160/98   Pulse 84   Ht 5\' 2"  (1.575 m)   Wt 120 lb (54.4 kg)   BMI 21.95 kg/m  Constitutional: Pleasant, female in no acute distress. Neck supple.  Cardiovascular: Normal rate, regular rhythm.  Pulmonary/chest: Effort normal and  breath sounds normal. No wheezing, rales or rhonchi. Abdominal: Soft, nondistended, nontender.  There are no masses palpable. No hepatomegaly. Extremities: no edema Lymphadenopathy: No  cervical adenopathy noted. Neurological: Alert and oriented to person place and time. Skin: Skin is warm and dry. No rashes noted. Psychiatric: Normal mood and affect. Behavior is normal.   ASSESSMENT AND PLAN: 76 year old female with history of stage IV breast cancer on chemotherapy long-standing IBS and diverticular disease, seen in consultation to discuss the following issues:  Chronic bloating / alteration in bowel habits / IBS - discussed options for management for this chronic issue. Suspect she has IBS, although symptomatic diverticular disease (SUDD) is also possible as outlined below. Fiber supplement would be good to help normalize her bowels however I think it will make her bloating worse so we'll hold off on that for now. Bentyl is not helping so we'll also stop that. Does think Beano has provided benefit so she can continue this as needed. I otherwise discussed treatment with rifaximin versus probiotics. I think she is a good candidate for rifaximin but she preferred to hold off on this for now in preference to try VSL#3 - we'll try 1 capsule twice a day. I also counseled her on a low FODMAP diet. We'll see how she responds to this regimen she can follow-up in 3 months for reassessment. Will send celiac serologies to be done to ensure negative, the next time she is in the lab.  History diverticulitis / abnormal CT scan - she had CT evidence of diverticulitis of the rectosigmoid colon in early July. She was treated for this, and a follow up CT scan showed resolution of these changes at the end of July. She states she's had more than 5 episodes of diverticulitis in her lifetime dating back several years. She's had 2 colonoscopies showing pancolonic diverticulosis. I do think she recently had diverticulitis,  she responded appropriately to antibiotics. Her last colonoscopy was in 2013, she had no adenomas at that time. She wishes to avoid further colonoscopy if at all possible. I agree with her. With her metastatic breast cancer and known history of diverticulitis, I don't feel strongly she needs another exam at this time. I counseled her it is possible for her more chronic symptoms that she has symptomatic uncomplicated diverticular disease as well (SUDD). Rifaximin is a treatment option for this, as as above, she may be a good candidate for this but she declined therapy as above for now. Will await her course.   All questions answered, she agreed with the plan.   Mobile Cellar, MD [facility] Pager [phone]  CC: Magrinat, Virgie Dad, MD

## 2016-12-27 ENCOUNTER — Ambulatory Visit (HOSPITAL_BASED_OUTPATIENT_CLINIC_OR_DEPARTMENT_OTHER): Payer: Medicare Other | Admitting: Oncology

## 2016-12-27 ENCOUNTER — Other Ambulatory Visit (HOSPITAL_BASED_OUTPATIENT_CLINIC_OR_DEPARTMENT_OTHER): Payer: Medicare Other

## 2016-12-27 ENCOUNTER — Ambulatory Visit (HOSPITAL_BASED_OUTPATIENT_CLINIC_OR_DEPARTMENT_OTHER): Payer: Medicare Other

## 2016-12-27 ENCOUNTER — Other Ambulatory Visit: Payer: Self-pay | Admitting: Oncology

## 2016-12-27 ENCOUNTER — Other Ambulatory Visit: Payer: Self-pay | Admitting: *Deleted

## 2016-12-27 VITALS — BP 141/77 | HR 67 | Temp 97.6°F | Resp 17 | Ht 62.0 in | Wt 121.0 lb

## 2016-12-27 DIAGNOSIS — Z853 Personal history of malignant neoplasm of breast: Secondary | ICD-10-CM

## 2016-12-27 DIAGNOSIS — Z5111 Encounter for antineoplastic chemotherapy: Secondary | ICD-10-CM

## 2016-12-27 DIAGNOSIS — C50411 Malignant neoplasm of upper-outer quadrant of right female breast: Secondary | ICD-10-CM | POA: Diagnosis not present

## 2016-12-27 DIAGNOSIS — C7951 Secondary malignant neoplasm of bone: Secondary | ICD-10-CM

## 2016-12-27 DIAGNOSIS — C50911 Malignant neoplasm of unspecified site of right female breast: Secondary | ICD-10-CM | POA: Diagnosis not present

## 2016-12-27 DIAGNOSIS — Z170 Estrogen receptor positive status [ER+]: Secondary | ICD-10-CM

## 2016-12-27 LAB — CBC WITH DIFFERENTIAL/PLATELET
BASO%: 2.1 % — ABNORMAL HIGH (ref 0.0–2.0)
Basophils Absolute: 0.1 10*3/uL (ref 0.0–0.1)
EOS%: 4.4 % (ref 0.0–7.0)
Eosinophils Absolute: 0.2 10*3/uL (ref 0.0–0.5)
HCT: 38.5 % (ref 34.8–46.6)
HGB: 13.3 g/dL (ref 11.6–15.9)
LYMPH%: 42.7 % (ref 14.0–49.7)
MCH: 36.5 pg — ABNORMAL HIGH (ref 25.1–34.0)
MCHC: 34.5 g/dL (ref 31.5–36.0)
MCV: 105.9 fL — ABNORMAL HIGH (ref 79.5–101.0)
MONO#: 0.2 10*3/uL (ref 0.1–0.9)
MONO%: 6.7 % (ref 0.0–14.0)
NEUT#: 1.6 10*3/uL (ref 1.5–6.5)
NEUT%: 44.1 % (ref 38.4–76.8)
Platelets: 160 10*3/uL (ref 145–400)
RBC: 3.64 10*6/uL — ABNORMAL LOW (ref 3.70–5.45)
RDW: 13.9 % (ref 11.2–14.5)
WBC: 3.5 10*3/uL — ABNORMAL LOW (ref 3.9–10.3)
lymph#: 1.5 10*3/uL (ref 0.9–3.3)

## 2016-12-27 LAB — COMPREHENSIVE METABOLIC PANEL
ALBUMIN: 3.7 g/dL (ref 3.5–5.0)
ALT: 15 U/L (ref 0–55)
ANION GAP: 8 meq/L (ref 3–11)
AST: 15 U/L (ref 5–34)
Alkaline Phosphatase: 69 U/L (ref 40–150)
BILIRUBIN TOTAL: 0.59 mg/dL (ref 0.20–1.20)
BUN: 14.6 mg/dL (ref 7.0–26.0)
CALCIUM: 9.7 mg/dL (ref 8.4–10.4)
CO2: 29 mEq/L (ref 22–29)
CREATININE: 0.8 mg/dL (ref 0.6–1.1)
Chloride: 104 mEq/L (ref 98–109)
EGFR: 67 mL/min/{1.73_m2} — ABNORMAL LOW (ref >90)
Glucose: 87 mg/dl (ref 70–140)
Potassium: 4.2 mEq/L (ref 3.5–5.1)
Sodium: 141 mEq/L (ref 136–145)
TOTAL PROTEIN: 6.9 g/dL (ref 6.4–8.3)

## 2016-12-27 MED ORDER — FULVESTRANT 250 MG/5ML IM SOLN
500.0000 mg | Freq: Once | INTRAMUSCULAR | Status: AC
  Administered 2016-12-27: 500 mg via INTRAMUSCULAR
  Filled 2016-12-27: qty 10

## 2016-12-27 NOTE — Progress Notes (Unsigned)
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 She continues on monthly fulvestrant, with good tolerance. She is also on Prilosec live, which she restarted 10/26/2016 for the current cycle. She takes at Mondays Wednesdays and Fridays. She tolerates that well.   REVIEW OF SYSTEMS: Myna was evaluated in the emergency room blood for persistent abdominal discomfort. CT scan was obtained which shows sigmoid diverticular disease. Endoscopy was suggested to make sure nothing else was going on. She was given Augmentin but she does not want to take it because of the "high-dose". She goes by the dose with no consideration of different potency's. This is very frustrating to her husband, Kela Millin is better. Aside from these issues a detailed review of systems today was stable  BREAST CANCER HISTORY: From the earlier summary note  Shaunee tells me in 2007 while living in [place] she was found to have a very small cancer in the upper-outer quadrant of the right breast, about the size of the P, noted on mammography. It was not palpable. She had a right lumpectomy and full sentinel lymph node sampling. She then received adjuvant radiation, to a total of 33 treatments.  She received no systemic therapy.  She then did well until December 2014, when she had a fall and complain of pain in her left ribs. Rib films and chest x-ray on 04/13/2013 showed no fractures, but CT of the abdomen and pelvis on the same day found subcarinal and right hilar adenopathy. This was followed up with a chest CT scan 05/05/2013 confirming extensive mediastinal and right hilar lymphadenopathy, with multiple pulmonary nodules, the largest measuring 0.9 cm. PET scan 05/21/2013 showed hypermetabolic adenopathy in the right and left paratracheal areas, the precarinal, subcarinal and right hilar areas, but no involvement of the liver, and the lung nodules were not hypermetabolic (although they were below the level of reliable detection). In addition, at L5 there was a lucency measuring 1.2 cm.  The PET scan also showed a hypermetabolic focus on the thyroid gland, which was evaluated with neck ultrasound and biopsy 09/32/3557, showing a follicular lesion of undetermined significance (([id number]).  The patient was referred to pulmonary [Dr Melvyn Novas and Dr Julien Nordmann for further evaluation. Repeat PET scan 12/28/2013 showed, in addition to the adenopathy previously noted, now multiple bony metastases. On 01/07/2014 the patient underwent bronchoscopy and this showed (SZA [id number], together with separate cytology] a low-grade mucinous invasive breast cancer, estrogen receptor 100% positive, progesterone receptor 14% positive, with no HER-2 amplification, the signals ratio being 1.30 and the number per cell 1.95.  The patient was started on anastrozole 01/22/2014; monthly denosumab/Xgeva was added 07/30/2014. She appeared to tolerate this well, and her CA-27-29 (127 at baseline) normalized. Most recent CT scans of chest abdomen and pelvis 10/03/2015 showed continuing response. Despite this good news however, the patient decided to go off anastrozole and  denosumab/Xgeva as of June 2017. By the time she saw Dr.  Earlie Server again in September 2017 her tumor marker had doubled. At that time the patient was referred to the breast clinic for a second opinion regarding further evaluation and treatment.   PAST MEDICAL HISTORY: Past Medical History:  Diagnosis Date  . Allergy   . Anxiety   . Bone cancer ([place]) mri 11/11/14   right parietal bone,left cribriform plate metastases  . Cancer [facility]) 2007   right breat ca  , lumpectomy and radiation tx (declined chemo and additional prophylactic meds)  . CEREBROVASCULAR DISEASE 03/03/2010  . Diverticulitis   . DIVERTICULOSIS, COLON 03/03/2010  . Fatty liver 08/02/09   as per U/S done by [facility]  . Foramen ovale    positive bubble study  . Headache(784.0)    she thinks sinus headaches  . History of cerebral artery stenosis    right middle  . HYPERLIPIDEMIA 03/03/2010  . Hypertension   . HYPOTHYROIDISM 03/03/2010   no longer on meds  . Internal hemorrhoid   . Low back pain   . Mastoiditis    noted on brain MRI  . Rib fracture   . Right leg pain   . Shortness of breath   . Stroke [facility]) Oct. 2011   TIA  . Ulcer     PAST SURGICAL HISTORY: Past Surgical History:  Procedure Laterality Date  . BREAST LUMPECTOMY     right  . CATARACT EXTRACTION Left   . CHOLECYSTECTOMY    . COLONOSCOPY    . SHOULDER SURGERY     right shoulder (dislocation)  . TONSILLECTOMY    . TUBAL LIGATION    . VIDEO BRONCHOSCOPY WITH ENDOBRONCHIAL ULTRASOUND N/A 01/07/2014   Procedure: VIDEO BRONCHOSCOPY WITH ENDOBRONCHIAL ULTRASOUND;  Surgeon: Ivin Poot, MD;  Location: [facility] OR;  Service: Thoracic;  Laterality: N/A;    FAMILY HISTORY Family History  Problem Relation Age of Onset  . Heart disease Sister        cerebral vascular disease also  . Heart disease Brother        cerebral vascular disease also  . Heart disease Brother        cerebral vascular disease  . Heart disease Sister        cebreal vascular disease also  . Heart disease Sister         cebreal vascular diease also  . Heart disease Sister        cerebral vascular diease also  . Stomach cancer Father   . Colon cancer Neg Hx   . Esophageal cancer Neg Hx   . Rectal cancer Neg Hx   The patient's father died from stomach cancer in his late 73s. The patient's mother died in her 35s from a stroke. The patient has 2 brothers, 4 sisters. One sister had leukemia. There is no history of breast, colon, or ovarian cancer in the family to her knowledge  GYNECOLOGIC HISTORY:  No LMP recorded. Patient is postmenopausal. Menarche age 65, first live birth age 11, the patient is [place] P2. She went through menopause in her late 74s. She took no hormone replacement. She took oral contraceptives for approximately 2 years remotely without complications.  SOCIAL HISTORY:  Jaleigha is originally from [place]. 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 Her husband Mortimer Fries is a retired substance abuse Social worker. This is a second marriage for both of them. Keionna has 2 children from  her first marriage, [name], lives in [place], and worked as a Audiological scientist but is now disabled, and Lawrence Santiago, who lives in [place] and works in Engineer, mining. The patient has 6 grandchildren. She attends [employer].--Mikki Santee has 3 children from his first marriage. [name] Junior lives in [place] and has his own trucking business. He tells me he is estranged from his 2 daughters Amedeo Gory (a retired Pharmacist, hospital) and Salena Saner (who works in a bank). They living [place].    ADVANCED DIRECTIVES: in place   HEALTH MAINTENANCE: Social History  Substance Use Topics  . Smoking status: Former Smoker    Packs/day: 0.50    Years: 20.00    Types: Cigarettes    Quit date: 05/01/1991  . Smokeless tobacco: Never Used  . Alcohol use No     Colonoscopy:  PAP:  Bone density:   Allergies  Allergen Reactions  . Ciprofloxacin Anaphylaxis    Current Outpatient  Prescriptions  Medication Sig Dispense Refill  . ALPRAZolam (XANAX) 0.25 MG tablet TAKE 1 TABLET BY MOUTH THREE TIMES DAILY AS NEEDED FOR ANXIETY 90 tablet 0  . aspirin EC 81 MG tablet Take 81 mg by mouth daily after breakfast.     . CARTIA XT 120 MG 24 hr capsule TAKE 1 CAPSULE BY MOUTH DAILY (Patient taking differently: TAKE 120 MG BY MOUTH DAILY) 90 capsule 1  . Cholecalciferol (VITAMIN D PO) Take 1,000 Units by mouth 2 (two) times daily.    Marland Kitchen dicyclomine (BENTYL) 10 MG capsule Take 1 capsule (10 mg total) by mouth 4 (four) times daily -  before meals and at bedtime. (Patient taking differently: Take 10 mg by mouth 4 (four) times daily. ) 60 capsule 2  . docusate sodium (COLACE) 100 MG capsule Take 100 mg by mouth daily as needed for mild constipation.    . Flaxseed, Linseed, (FLAX SEEDS PO) Take 1 tablet by mouth daily.     . FULVESTRANT IM Inject into the muscle.    . Glucosamine 500 MG CAPS Take 500 mg by mouth daily.     . meclizine (ANTIVERT) 25 MG tablet TAKE 1 TABLET(25 MG) BY MOUTH EVERY 6 HOURS AS NEEDED FOR DIZZINESS 60 tablet 0  . omeprazole (PRILOSEC) 20 MG capsule Take 1 capsule (20 mg total) by mouth daily. (Patient taking differently: Take 20 mg by mouth daily as needed (reflux). ) 90 capsule 4  . OVER THE COUNTER MEDICATION Beno for gas, takes twice daily    . palbociclib (IBRANCE) 75 MG capsule Take 1 capsule (75 mg total) by mouth daily with breakfast. Take whole with food. (Patient taking differently: Take 75 mg by mouth 3 (three) times a week. Take every mon,wed,fri for 3 weeks, then take 1 week off.) 21 capsule 6  . Probiotic Product (PROBIOTIC DAILY PO) Take 1 tablet by mouth daily.     . Probiotic Product (VSL#3) CAPS 112.5 billion capsule Take 1 capsule twice daily for 2 weeks 20 capsule 0  . simvastatin (ZOCOR) 5 MG tablet TAKE 1 TABLET BY MOUTH DAILY (Patient taking differently: TAKE 5 MG BY MOUTH DAILY) 90 tablet 1   No current facility-administered medications for  this visit.     OBJECTIVE: Older white womanWho appears stated age  There were no vitals filed for this visit.   There is no height or weight on file to calculate BMI.    ECOG FS:1 - Symptomatic but completely ambulatory  Sclerae unicteric, pupils round and equal Oropharynx  clear and moist No cervical or supraclavicular adenopathy Lungs no rales or rhonchi Heart regular rate and rhythm Abd soft, nontender, positive bowel sounds MSK no focal spinal tenderness, no upper extremity lymphedema Neuro: nonfocal, well oriented, appropriate affect Breasts: Deferred    LAB RESULTS:  CMP     Component Value Date/Time   NA 141 12/27/2016 0842   K 4.2 12/27/2016 0842   CL 102 11/19/2016 1750   CO2 29 12/27/2016 0842   GLUCOSE 87 12/27/2016 0842   BUN 14.6 12/27/2016 0842   CREATININE 0.8 12/27/2016 0842   CALCIUM 9.7 12/27/2016 0842   PROT 6.9 12/27/2016 0842   ALBUMIN 3.7 12/27/2016 0842   AST 15 12/27/2016 0842   ALT 15 12/27/2016 0842   ALKPHOS 69 12/27/2016 0842   BILITOT 0.59 12/27/2016 0842   GFRNONAA >60 11/19/2016 1750   GFRAA >60 11/19/2016 1750    INo results found for: SPEP, UPEP  Lab Results  Component Value Date   WBC 3.5 (L) 12/27/2016   NEUTROABS 1.6 12/27/2016   HGB 13.3 12/27/2016   HCT 38.5 12/27/2016   MCV 105.9 (H) 12/27/2016   PLT 160 12/27/2016      Chemistry      Component Value Date/Time   NA 141 12/27/2016 0842   K 4.2 12/27/2016 0842   CL 102 11/19/2016 1750   CO2 29 12/27/2016 0842   BUN 14.6 12/27/2016 0842   CREATININE 0.8 12/27/2016 0842      Component Value Date/Time   CALCIUM 9.7 12/27/2016 0842   ALKPHOS 69 12/27/2016 0842   AST 15 12/27/2016 0842   ALT 15 12/27/2016 0842   BILITOT 0.59 12/27/2016 0842       Lab Results  Component Value Date   LABCA2 25 08/30/2015    No components found for: GYFVC944  No results for input(s): INR in the last 168 hours.  Urinalysis    Component Value Date/Time   COLORURINE STRAW  (A) 11/19/2016 1750   APPEARANCEUR CLEAR 11/19/2016 1750   LABSPEC 1.003 (L) 11/19/2016 1750   PHURINE 7.0 11/19/2016 1750   GLUCOSEU NEGATIVE 11/19/2016 1750   HGBUR NEGATIVE 11/19/2016 1750   BILIRUBINUR NEGATIVE 11/19/2016 1750   BILIRUBINUR neg 02/16/2015 1318   KETONESUR NEGATIVE 11/19/2016 1750   PROTEINUR NEGATIVE 11/19/2016 1750   UROBILINOGEN 0.2 02/16/2015 1318   UROBILINOGEN 0.2 11/18/2013 1321   NITRITE NEGATIVE 11/19/2016 1750   LEUKOCYTESUR TRACE (A) 11/19/2016 1750     STUDIES: No results found.   ELIGIBLE FOR AVAILABLE RESEARCH PROTOCOL: no  ASSESSMENT: 76 y.o. [place] woman  (1) status post right breast upper outer quadrant lumpectomy and axillary lymph node dissection in 2007 for what appears to have been a T1 N0, stage IA invasive ductal carcinoma, treated adjuvantly with radiation (33 sessions)  METASTATIC DISEASE definitively documented Sept 2015 (2) bronchoscopic biopsy 01/07/2014 showed a low-grade mucinous breast cancer, strongly estrogen receptor positive, progesterone receptor positive, HER-2 negative; staging studies confirmed extensive hypermetabolic adenopathy, multiple bone lesions, likely early lung involvement, but no liver lesions.  (a) bone scan and chest CT 07/09/2016 shows stable disease.  (3) on Arimidex between September 2015 and June 2017, with evidence of response; discontinued secondary to side effects  (a) bone density 04/10/2016 shows osteopenia with a T score of -2.1  (4) on monthly denosumab/Xgeva between 07/30/2014 and 10/04/2015, discontinued due to patient's concerns regarding osteonecrosis of the jaw  (a) discussed again October 2017, the patient adamantly refusing denosumab  (5) started fulvestrant 01/26/2016   (  a) Palbociclib added at 75 mg M/W/F, beginning mid February 2018  PLAN I spent approximately 30 minutes with Sicilia with most of that time spent discussing her concerns. As far as her breast cancer is concerned she  is tolerating the fulvestrant well. She will receive a dose today and August 2. Her next dose will be August 30 and I will see her on that day.  She is also tolerating the palpable cyclic well. This is her second week of the current cycle. She has good counts. I'm making no changes in that medication.  I would like to give her bisphosphonates or do not some but she adamantly refuses.  I reviewed with her the results of her CT of the abdomen which strongly suggest diverticular disease. She has in addition a variety of GI concerns that are fairly chronic. She is to see Delfin Edis about all this but of course Dr. Maurene Capes has retired. I think it would be a good idea if Luvern reestablished herself with the lobe our GI group. I have placed that referral today.  Otherwise the plan is to continue current treatment at least through this year. I am intending to restage her with a PET scan sometime in late November or early December..   She knows to call for any other problems that may develop before the next visit. Chauncey Cruel  12/27/2016 Medical Oncology and Hematology [facility] [address] [place], [place] [address] Tel. [phone]    Fax. [phone]

## 2016-12-27 NOTE — Progress Notes (Signed)
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 She continues on fulvestrant monthly. She generally tolerates it well. She tells me the last time she did not have Jenny Reichmann doing her the shot and however gave her the shot dated very painful on one side. She would really prefer it if Jenny Reichmann could do it every single time. She understands this may not be possible because of occasions and scheduling issues. Aside from that she tolerates the fulvestrant well.  She finished her most recent cycle of palbociclib on 12/12/2016 and she resumed 12/21/2016. She is tolerating that with no side effects that she is aware of.  Since the last visit here she met with Dr. Havery Moros. He started her on probiotics and put her on a no gluten diet. She is struggling to understand that and she and her husband find it difficult but they are very motivated and the reading all the labels in this door before that by anything. She understands it may take some time for this change to work.   REVIEW OF SYSTEMS: Alfa continues to have problems with insomnia. She has aches here and there which are not more intense  or persistent than before. She had cataract surgery to her left eye and she does not think it has gone well. Just some sinus issues. She has occasional palpitations. A detailed review of systems today was otherwise stable  BREAST CANCER HISTORY: From the earlier summary note  Hatsuko tells me in 2007 while living in [place] she was found to have a very small cancer in the upper-outer quadrant of the right breast, about the size of the P, noted on mammography. It was not palpable. She had a right lumpectomy and full sentinel lymph node sampling. She then received adjuvant radiation, to a total of 33 treatments. She received no systemic therapy.  She then did well until December 2014, when she had a fall and complain of pain in her left ribs. Rib films and chest x-ray on 04/13/2013 showed no fractures, but CT of the abdomen and pelvis on the same day found subcarinal and right hilar adenopathy. This was followed up with a chest CT scan 05/05/2013 confirming extensive mediastinal and right hilar lymphadenopathy, with multiple pulmonary nodules, the largest measuring 0.9 cm. PET scan 05/21/2013 showed hypermetabolic adenopathy in the right and left paratracheal areas, the precarinal, subcarinal and right hilar areas, but no involvement of the liver, and the lung nodules were not hypermetabolic (although they were below the level of reliable detection). In addition, at L5 there was a lucency measuring 1.2 cm.  The PET scan also showed a hypermetabolic focus on the thyroid gland, which was evaluated with neck ultrasound and biopsy 32/95/1884, showing a follicular lesion of undetermined significance (([id number]).  The patient was referred to pulmonary [Dr Melvyn Novas  and Dr Julien Nordmann for further evaluation. Repeat PET scan 12/28/2013 showed, in addition to the adenopathy previously noted, now multiple bony metastases. On 01/07/2014 the patient underwent bronchoscopy and this showed ([id number] [id number], together with separate  cytology] a low-grade mucinous invasive breast cancer, estrogen receptor 100% positive, progesterone receptor 14% positive, with no HER-2 amplification, the signals ratio being 1.30 and the number per cell 1.95.  The patient was started on anastrozole 01/22/2014; monthly denosumab/Xgeva was added 07/30/2014. She appeared to tolerate this well, and her CA-27-29 (127 at baseline) normalized. Most recent CT scans of chest abdomen and pelvis 10/03/2015 showed continuing response. Despite this good news however, the patient decided to go off anastrozole and denosumab/Xgeva as of June 2017. By the time she saw Dr. Earlie Server again in September 2017 her tumor marker had doubled. At that time the patient was referred to the breast clinic for a second opinion regarding further evaluation and treatment.   PAST MEDICAL HISTORY: Past Medical History:  Diagnosis Date  . Allergy   . Anxiety   . Bone cancer ([place]) mri 11/11/14   right parietal bone,left cribriform plate metastases  . Cancer [facility]) 2007   right breat ca  , lumpectomy and radiation tx (declined chemo and additional prophylactic meds)  . CEREBROVASCULAR DISEASE 03/03/2010  . Diverticulitis   . DIVERTICULOSIS, COLON 03/03/2010  . Fatty liver 08/02/09   as per U/S done by [facility]  . Foramen ovale    positive bubble study  . Headache(784.0)    she thinks sinus headaches  . History of cerebral artery stenosis    right middle  . HYPERLIPIDEMIA 03/03/2010  . Hypertension   . HYPOTHYROIDISM 03/03/2010   no longer on meds  . Internal hemorrhoid   . Low back pain   . Mastoiditis    noted on brain MRI  . Rib fracture   . Right leg pain   . Shortness of breath   . Stroke [facility]) Oct. 2011   TIA  . Ulcer     PAST SURGICAL HISTORY: Past Surgical History:  Procedure Laterality Date  . BREAST LUMPECTOMY     right  . CATARACT EXTRACTION Left   . CHOLECYSTECTOMY    . COLONOSCOPY    . SHOULDER SURGERY     right shoulder  (dislocation)  . TONSILLECTOMY    . TUBAL LIGATION    . VIDEO BRONCHOSCOPY WITH ENDOBRONCHIAL ULTRASOUND N/A 01/07/2014   Procedure: VIDEO BRONCHOSCOPY WITH ENDOBRONCHIAL ULTRASOUND;  Surgeon: Ivin Poot, MD;  Location: [facility] OR;  Service: Thoracic;  Laterality: N/A;    FAMILY HISTORY Family History  Problem Relation Age of Onset  . Heart disease Sister        cerebral vascular disease also  . Heart disease Brother        cerebral vascular disease also  . Heart disease Brother        cerebral vascular disease  . Heart disease Sister        cebreal vascular disease also  . Heart disease Sister        cebreal vascular diease also  . Heart disease Sister        cerebral vascular diease also  . Stomach cancer Father   . Colon cancer Neg Hx   . Esophageal cancer Neg Hx   . Rectal cancer Neg Hx   The patient's father died from stomach cancer in his late 64s. The patient's mother died in her 69s from a stroke. The patient has  2 brothers, 4 sisters. One sister had leukemia. There is no history of breast, colon, or ovarian cancer in the family to her knowledge  GYNECOLOGIC HISTORY:  No LMP recorded. Patient is postmenopausal. Menarche age 33, first live birth age 31, the patient is [place] P2. She went through menopause in her late 4s. She took no hormone replacement. She took oral contraceptives for approximately 2 years remotely without complications.  SOCIAL HISTORY:  Briella is originally from [place]. 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 Her husband Mortimer Fries is a retired substance abuse Social worker. This is a second marriage for both of them. Jullie has 2 children from her first marriage, [name], lives in [place], and worked as a Audiological scientist but is now disabled, and Lawrence Santiago, who lives in [place] and works in Engineer, mining. The patient has 6 grandchildren. She attends [employer].--Mikki Santee has 3 children from his first  marriage. [name] Junior lives in [place] and has his own trucking business. He tells me he is estranged from his 2 daughters Amedeo Gory (a retired Pharmacist, hospital) and Salena Saner (who works in a bank). They living [place].    ADVANCED DIRECTIVES: in place   HEALTH MAINTENANCE: Social History  Substance Use Topics  . Smoking status: Former Smoker    Packs/day: 0.50    Years: 20.00    Types: Cigarettes    Quit date: 05/01/1991  . Smokeless tobacco: Never Used  . Alcohol use No     Colonoscopy:  PAP:  Bone density:   Allergies  Allergen Reactions  . Ciprofloxacin Anaphylaxis    Current Outpatient Prescriptions  Medication Sig Dispense Refill  . ALPRAZolam (XANAX) 0.25 MG tablet TAKE 1 TABLET BY MOUTH THREE TIMES DAILY AS NEEDED FOR ANXIETY 90 tablet 0  . aspirin EC 81 MG tablet Take 81 mg by mouth daily after breakfast.     . CARTIA XT 120 MG 24 hr capsule TAKE 1 CAPSULE BY MOUTH DAILY (Patient taking differently: TAKE 120 MG BY MOUTH DAILY) 90 capsule 1  . Cholecalciferol (VITAMIN D PO) Take 1,000 Units by mouth 2 (two) times daily.    Marland Kitchen dicyclomine (BENTYL) 10 MG capsule Take 1 capsule (10 mg total) by mouth 4 (four) times daily -  before meals and at bedtime. (Patient taking differently: Take 10 mg by mouth 4 (four) times daily. ) 60 capsule 2  . docusate sodium (COLACE) 100 MG capsule Take 100 mg by mouth daily as needed for mild constipation.    . Flaxseed, Linseed, (FLAX SEEDS PO) Take 1 tablet by mouth daily.     . FULVESTRANT IM Inject into the muscle.    . Glucosamine 500 MG CAPS Take 500 mg by mouth daily.     . meclizine (ANTIVERT) 25 MG tablet TAKE 1 TABLET(25 MG) BY MOUTH EVERY 6 HOURS AS NEEDED FOR DIZZINESS 60 tablet 0  . omeprazole (PRILOSEC) 20 MG capsule Take 1 capsule (20 mg total) by mouth daily. (Patient taking differently: Take 20 mg by mouth daily as needed (reflux). ) 90 capsule 4  . OVER THE COUNTER MEDICATION Beno for gas, takes twice daily    . palbociclib  (IBRANCE) 75 MG capsule Take 1 capsule (75 mg total) by mouth daily with breakfast. Take whole with food. (Patient taking differently: Take 75 mg by mouth 3 (three) times a week. Take every mon,wed,fri for 3 weeks, then take 1 week off.) 21 capsule 6  . Probiotic  Product (PROBIOTIC DAILY PO) Take 1 tablet by mouth daily.     . Probiotic Product (VSL#3) CAPS 112.5 billion capsule Take 1 capsule twice daily for 2 weeks 20 capsule 0  . simvastatin (ZOCOR) 5 MG tablet TAKE 1 TABLET BY MOUTH DAILY (Patient taking differently: TAKE 5 MG BY MOUTH DAILY) 90 tablet 1   No current facility-administered medications for this visit.     OBJECTIVE: Older white woman who appears anxious  Vitals:   12/27/16 0912  BP: (!) 141/77  Pulse: 67  Resp: 17  Temp: 97.6 F (36.4 C)  SpO2: 98%     Body mass index is 22.13 kg/m.    ECOG FS:1 - Symptomatic but completely ambulatory  Sclerae unicteric, EOMs intact Oropharynx clear and moist No cervical or supraclavicular adenopathy Lungs no rales or rhonchi Heart regular rate and rhythm Abd soft, nontender, positive bowel sounds MSK no focal spinal tenderness, no upper extremity lymphedema Neuro: nonfocal, well oriented, appropriate affect Breasts: Deferred     LAB RESULTS:  CMP     Component Value Date/Time   NA 141 12/27/2016 0842   K 4.2 12/27/2016 0842   CL 102 11/19/2016 1750   CO2 29 12/27/2016 0842   GLUCOSE 87 12/27/2016 0842   BUN 14.6 12/27/2016 0842   CREATININE 0.8 12/27/2016 0842   CALCIUM 9.7 12/27/2016 0842   PROT 6.9 12/27/2016 0842   ALBUMIN 3.7 12/27/2016 0842   AST 15 12/27/2016 0842   ALT 15 12/27/2016 0842   ALKPHOS 69 12/27/2016 0842   BILITOT 0.59 12/27/2016 0842   GFRNONAA >60 11/19/2016 1750   GFRAA >60 11/19/2016 1750    INo results found for: SPEP, UPEP  Lab Results  Component Value Date   WBC 3.5 (L) 12/27/2016   NEUTROABS 1.6 12/27/2016   HGB 13.3 12/27/2016   HCT 38.5 12/27/2016   MCV 105.9 (H)  12/27/2016   PLT 160 12/27/2016      Chemistry      Component Value Date/Time   NA 141 12/27/2016 0842   K 4.2 12/27/2016 0842   CL 102 11/19/2016 1750   CO2 29 12/27/2016 0842   BUN 14.6 12/27/2016 0842   CREATININE 0.8 12/27/2016 0842      Component Value Date/Time   CALCIUM 9.7 12/27/2016 0842   ALKPHOS 69 12/27/2016 0842   AST 15 12/27/2016 0842   ALT 15 12/27/2016 0842   BILITOT 0.59 12/27/2016 0842       Lab Results  Component Value Date   LABCA2 25 08/30/2015    No components found for: [id number]  No results for input(s): INR in the last 168 hours.  Urinalysis    Component Value Date/Time   COLORURINE STRAW (A) 11/19/2016 1750   APPEARANCEUR CLEAR 11/19/2016 1750   LABSPEC 1.003 (L) 11/19/2016 1750   PHURINE 7.0 11/19/2016 1750   GLUCOSEU NEGATIVE 11/19/2016 1750   HGBUR NEGATIVE 11/19/2016 1750   BILIRUBINUR NEGATIVE 11/19/2016 1750   BILIRUBINUR neg 02/16/2015 1318   KETONESUR NEGATIVE 11/19/2016 1750   PROTEINUR NEGATIVE 11/19/2016 1750   UROBILINOGEN 0.2 02/16/2015 1318   UROBILINOGEN 0.2 11/18/2013 1321   NITRITE NEGATIVE 11/19/2016 1750   LEUKOCYTESUR TRACE (A) 11/19/2016 1750     STUDIES: CA-27-29 continues to drop, the most recent reading being 46.2  ELIGIBLE FOR AVAILABLE RESEARCH PROTOCOL: no  ASSESSMENT: 76 y.o. [place] woman  (1) status post right breast upper outer quadrant lumpectomy and axillary lymph node dissection in 2007 for what appears to have been  a T1 N0, stage IA invasive ductal carcinoma, treated adjuvantly with radiation (33 sessions)  METASTATIC DISEASE definitively documented Sept 2015 (2) bronchoscopic biopsy 01/07/2014 showed a low-grade mucinous breast cancer, strongly estrogen receptor positive, progesterone receptor positive, HER-2 negative; staging studies confirmed extensive hypermetabolic adenopathy, multiple bone lesions, likely early lung involvement, but no liver lesions.  (a) bone scan and chest CT  07/09/2016 shows stable disease.  (3) on Arimidex between September 2015 and June 2017, with evidence of response; discontinued secondary to side effects  (a) bone density 04/10/2016 shows osteopenia with a T score of -2.1  (4) on monthly denosumab/Xgeva between 07/30/2014 and 10/04/2015, discontinued due to patient's concerns regarding osteonecrosis of the jaw  (a) discussed again October 2017, the patient adamantly refusing denosumab  (5) started fulvestrant 01/26/2016   (a) Palbociclib added at 75 mg M/W/F, beginning mid February 2018  PLAN I spent approximately 30 minutes with Edythe with most of that time spent discussing her questions concerns and overall situation. Retal is now just about a year on fulvestrant, tolerating it well, with continuing evidence of response: Clinically she is doing great and her CA-27-29 continues to drop.  I would love to give her denosumab/Xgeva but she really is against that so we are not doing that at least for now.  She is very motivated to follow the diet suggested by Dr. Havery Moros and today we reviewed how she can figure out if things contained gluten or not. She is realistic in not expecting immediate results and is willing to try this long-term.  We are going to see her with her October treatment, and then she will see me the day before Thanksgiving's when she gets treated. At that point we will consider whether or not she will have staging studies towards the end of the year.  She knows to call for any other issues that may develop before her next visit here. Chauncey Cruel  12/27/2016 Medical Oncology and Hematology [facility] [address] [place], [place] [address] Tel. [phone]    Fax. [phone]

## 2016-12-28 LAB — CANCER ANTIGEN 27.29: CAN 27.29: 49.5 U/mL — AB (ref 0.0–38.6)

## 2016-12-29 ENCOUNTER — Other Ambulatory Visit: Payer: Self-pay | Admitting: Internal Medicine

## 2017-01-01 ENCOUNTER — Telehealth: Payer: Self-pay | Admitting: Gastroenterology

## 2017-01-01 NOTE — Telephone Encounter (Signed)
Called patient back, she was sleeping so advised husband of what Dr. Havery Moros would like to have her do. They will come in for lab work. Faxed request to [facility] for Rifaximin.

## 2017-01-01 NOTE — Telephone Encounter (Signed)
Thanks for the update. I had asked her to go to the lab for celiac testing, I don't see that it has been done yet, can you ask her to go to the lab, the order appears to still be valid. Otherwise in clinic I discussed probiotics versus trial of rifaximin for her. If she is feeling worse on the probiotic and she thinks it is related, then she should stop it. In this light, I think a trial of rifaximin 550mg  TID for 2 weeks is reasonable. Can you order this for her? Ordering it through [facility] may provide the best price, as it can be expensive. Thanks

## 2017-01-01 NOTE — Telephone Encounter (Signed)
Patient states that her abdominal pain (gas pains) has been getting worse, especially at night and unable to sleep. She has been taking the VSL#3 as directed and following the FODMAP diet. She has not been constipated, had a normal bm yesterday. She is questioning if the probiotic and diet maybe the cause of increased gas pain.

## 2017-01-02 ENCOUNTER — Other Ambulatory Visit: Payer: Self-pay

## 2017-01-02 ENCOUNTER — Other Ambulatory Visit (INDEPENDENT_AMBULATORY_CARE_PROVIDER_SITE_OTHER): Payer: Medicare Other

## 2017-01-02 ENCOUNTER — Telehealth: Payer: Self-pay | Admitting: Gastroenterology

## 2017-01-02 DIAGNOSIS — K589 Irritable bowel syndrome without diarrhea: Secondary | ICD-10-CM | POA: Diagnosis not present

## 2017-01-02 DIAGNOSIS — R109 Unspecified abdominal pain: Secondary | ICD-10-CM

## 2017-01-02 LAB — IGA: IgA: 158 mg/dL (ref 68–378)

## 2017-01-02 NOTE — Telephone Encounter (Signed)
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 She said that she feels the VSL#3 and FODMAP diet is making abdominal pain worse. She has modified the diet, she does want to finish out the VSL#3 through Friday. She will go down to lab to get lab work done today. I let her know that I have faxed in the Rifaximin to [facility] and will wait to hear from them. Told her that when we have the lab results will let her know.

## 2017-01-02 NOTE — Telephone Encounter (Signed)
Faxed office notes to [facility] pharmacy.

## 2017-01-02 NOTE — Telephone Encounter (Signed)
Please tell her that if the VSL#3 and diet is making her feel worse, PLEASE STOP IT.  It was a trial just to see if it would help her, and if not, she should stop it now. We will see how she does on Rifaximin. It may be best to see her again in clinic to discuss things further, not sure if she already has a scheduled appointment. Thanks for taking your time to speak with her today

## 2017-01-03 ENCOUNTER — Other Ambulatory Visit: Payer: Medicare Other

## 2017-01-03 DIAGNOSIS — R109 Unspecified abdominal pain: Secondary | ICD-10-CM

## 2017-01-04 MED ORDER — RIFAXIMIN 550 MG PO TABS: 550.0000 mg | ORAL_TABLET | Freq: Three times a day (TID) | ORAL | 0 refills | Status: DC

## 2017-01-04 NOTE — Telephone Encounter (Signed)
Received fax from [facility] . Pre Auth approved for Xifaxan 550mg  through 04-04-17.  Called pt to advise. Let her know that [facility] will be contacting her and will mail the Rx to her.  She expressed understanding.

## 2017-01-04 NOTE — Addendum Note (Signed)
Addended by: Roetta Sessions on: 01/04/2017 04:59 PM   Modules accepted: Orders

## 2017-01-07 LAB — TISSUE TRANSGLUTAMINASE ABS,IGG,IGA
(tTG) Ab, IgA: 1 U/mL
(tTG) Ab, IgG: 1 U/mL

## 2017-01-08 ENCOUNTER — Other Ambulatory Visit: Payer: Self-pay

## 2017-01-08 ENCOUNTER — Telehealth: Payer: Self-pay

## 2017-01-08 MED ORDER — PALBOCICLIB 75 MG PO CAPS: 75.0000 mg | ORAL_CAPSULE | ORAL | 0 refills | Status: DC

## 2017-01-08 NOTE — Telephone Encounter (Signed)
Noted already in chart that [place] was approved and patient aware.

## 2017-01-17 ENCOUNTER — Encounter: Payer: Self-pay | Admitting: Internal Medicine

## 2017-01-24 ENCOUNTER — Ambulatory Visit (HOSPITAL_BASED_OUTPATIENT_CLINIC_OR_DEPARTMENT_OTHER): Payer: Medicare Other | Admitting: Medical

## 2017-01-24 ENCOUNTER — Telehealth: Payer: Self-pay

## 2017-01-24 ENCOUNTER — Other Ambulatory Visit (HOSPITAL_BASED_OUTPATIENT_CLINIC_OR_DEPARTMENT_OTHER): Payer: Medicare Other

## 2017-01-24 ENCOUNTER — Telehealth: Payer: Self-pay | Admitting: Gastroenterology

## 2017-01-24 VITALS — BP 136/73 | HR 69 | Temp 97.8°F | Ht 62.0 in | Wt 119.0 lb

## 2017-01-24 DIAGNOSIS — C7951 Secondary malignant neoplasm of bone: Secondary | ICD-10-CM | POA: Diagnosis not present

## 2017-01-24 DIAGNOSIS — C50411 Malignant neoplasm of upper-outer quadrant of right female breast: Secondary | ICD-10-CM | POA: Diagnosis not present

## 2017-01-24 DIAGNOSIS — Z5111 Encounter for antineoplastic chemotherapy: Secondary | ICD-10-CM | POA: Diagnosis present

## 2017-01-24 DIAGNOSIS — C50911 Malignant neoplasm of unspecified site of right female breast: Secondary | ICD-10-CM

## 2017-01-24 DIAGNOSIS — Z853 Personal history of malignant neoplasm of breast: Secondary | ICD-10-CM

## 2017-01-24 LAB — CBC WITH DIFFERENTIAL/PLATELET
BASO%: 2.5 % — AB (ref 0.0–2.0)
Basophils Absolute: 0.1 10*3/uL (ref 0.0–0.1)
EOS%: 3.4 % (ref 0.0–7.0)
Eosinophils Absolute: 0.1 10*3/uL (ref 0.0–0.5)
HEMATOCRIT: 37.5 % (ref 34.8–46.6)
HGB: 13.1 g/dL (ref 11.6–15.9)
LYMPH%: 43.2 % (ref 14.0–49.7)
MCH: 36.2 pg — AB (ref 25.1–34.0)
MCHC: 34.9 g/dL (ref 31.5–36.0)
MCV: 103.6 fL — ABNORMAL HIGH (ref 79.5–101.0)
MONO#: 0.3 10*3/uL (ref 0.1–0.9)
MONO%: 8.1 % (ref 0.0–14.0)
NEUT%: 42.8 % (ref 38.4–76.8)
NEUTROS ABS: 1.4 10*3/uL — AB (ref 1.5–6.5)
Platelets: 157 10*3/uL (ref 145–400)
RBC: 3.62 10*6/uL — AB (ref 3.70–5.45)
RDW: 12.8 % (ref 11.2–14.5)
WBC: 3.2 10*3/uL — AB (ref 3.9–10.3)
lymph#: 1.4 10*3/uL (ref 0.9–3.3)
nRBC: 0 % (ref 0–0)

## 2017-01-24 LAB — COMPREHENSIVE METABOLIC PANEL
ALBUMIN: 3.8 g/dL (ref 3.5–5.0)
ALK PHOS: 69 U/L (ref 40–150)
ALT: 13 U/L (ref 0–55)
AST: 18 U/L (ref 5–34)
Anion Gap: 5 mEq/L (ref 3–11)
BILIRUBIN TOTAL: 0.5 mg/dL (ref 0.20–1.20)
BUN: 13.2 mg/dL (ref 7.0–26.0)
CALCIUM: 9.7 mg/dL (ref 8.4–10.4)
CO2: 30 mEq/L — ABNORMAL HIGH (ref 22–29)
CREATININE: 0.8 mg/dL (ref 0.6–1.1)
Chloride: 102 mEq/L (ref 98–109)
EGFR: 67 mL/min/{1.73_m2} — ABNORMAL LOW (ref >90)
GLUCOSE: 112 mg/dL (ref 70–140)
Potassium: 5.4 mEq/L — ABNORMAL HIGH (ref 3.5–5.1)
SODIUM: 138 meq/L (ref 136–145)
Total Protein: 7.1 g/dL (ref 6.4–8.3)

## 2017-01-24 MED ORDER — FULVESTRANT 250 MG/5ML IM SOLN
500.0000 mg | Freq: Once | INTRAMUSCULAR | Status: AC
  Administered 2017-01-24: 500 mg via INTRAMUSCULAR
  Filled 2017-01-24: qty 10

## 2017-01-24 NOTE — Patient Instructions (Signed)

## 2017-01-24 NOTE — Telephone Encounter (Signed)
Patient is still not feeling better, not sleeping, waking up every night with abdominal pain. She finished the Xifaxan and this has not helped. She has tried the diet, probiotic and Xifaxan and is still not feeling better. Patient is very distraught as she is not feeling well. She did not want to go to ED.

## 2017-01-24 NOTE — Telephone Encounter (Signed)
Tried calling patient back, no answer and unable to leave voice message. Will try again later.

## 2017-01-24 NOTE — Telephone Encounter (Signed)
Sorry to hear she is not feeling well. Any way we can get her in the clinic in the next week? Not sure if we need to repeat imaging, her workup has otherwise been unremarkable and she has not responded to therapies to date. Can you see if one of the APPs can see her? Thanks

## 2017-01-24 NOTE — Telephone Encounter (Signed)
Pt is not feeling good for several months. Nobody listening. Sick in stomach for many months. Followed diet that Dr Havery Moros told her to do for 2 weeks. Finished antibiotics yesterday. Pain every night waking her up about 2 or 3 in am, pain is in abdomen and she has a lot of gas, if she can burp or pass gas the pain goes away but she cannot go back to sleep. She burps in day when she eats but not at night. It is waking her up. Is losing weight. Does not feel she can wait until 10/17 to see Dr Havery Moros. It is not just the pain but the lack of sleep.  She is upset that they did not test before prescribing antibiotics and probiotic.   Pt has a call out to Dr Havery Moros, has not heard back yet. Instructed her to call if she does not hear from Dr Havery Moros.

## 2017-01-25 LAB — CANCER ANTIGEN 27.29: CA 27.29: 51.5 U/mL — ABNORMAL HIGH (ref 0.0–38.6)

## 2017-01-25 NOTE — Telephone Encounter (Signed)
Please see note.

## 2017-01-25 NOTE — Telephone Encounter (Signed)
Patient refused sooner appointment with APP. Again, patient stating that the food on the FODMAP diet is not helping. Went back over instructions with her from conversation on 9/4 to stop that diet if she feels it is not help. Patient has 4 tablets left of VSL#3 probiotic, would like a refill.

## 2017-01-26 ENCOUNTER — Other Ambulatory Visit: Payer: Self-pay | Admitting: Internal Medicine

## 2017-01-26 NOTE — Telephone Encounter (Signed)
I was not able to reach the patient on Friday. I called her today (saturday) and spoke with her for about 15 minutes about her symptoms. She states the low FODMAP diet has helped reduce her discomfort to some extent. She does not feel like the probiotics or the trial of Rifaximin has helped as much. Overall, her main complaint is gas and bloating which bothers her at night, and interferes with her sleep. She states she is not constipated, moving her bowels routinely. She has not had any benefit from bentyl, or simethicone. I'm not sure if her gas / bloating is dietary related or something she is eating prior to bedtime, as this appears to bother her at night only, she denies any other new changes to her diet. Recommend a trial of Beano with dinner and prior to bedtime, and also a trial of IB gard. If this does not help and her symptoms persist, we may consider a colonoscopy or repeating imaging, although she had 2 CT scans in July which did not show any clear pathology - one had some ? thickening, the follow up exam did not show this. She agreed, if no better on this regimen she may want to proceed with colonoscopy. The other question is whether or not her chemotherapy regimen is related to these symptoms at all - she has been on it for 6 months, her symptoms have been going on for 4 months. I will have to do some research about this and get back to her. I asked her to call back in about 2 weeks and let me know how she is doing  Almyra Free, can you have some IB gard samples for her to pick up on Monday, and any coupons for this if possible? Thanks

## 2017-01-28 ENCOUNTER — Other Ambulatory Visit: Payer: Self-pay

## 2017-01-28 MED ORDER — PEPPERMINT OIL OIL: TOPICAL_OIL | 12 refills | Status: DC | PRN

## 2017-01-28 NOTE — Telephone Encounter (Signed)
IBgard samples left at front desk along with coupons.

## 2017-02-01 ENCOUNTER — Telehealth: Payer: Self-pay | Admitting: Gastroenterology

## 2017-02-01 NOTE — Telephone Encounter (Signed)
Patient concerned about having snacks and if she should only take in 3 meals daily. Instructed husband that she needs to take in snacks if she is hungry to help put weight on back on her.

## 2017-02-06 MED FILL — IBRANCE 75 MG CAPSULE: 75 | 28 days supply | Qty: 9 | Fill #0

## 2017-02-13 ENCOUNTER — Ambulatory Visit (INDEPENDENT_AMBULATORY_CARE_PROVIDER_SITE_OTHER): Payer: Medicare Other | Admitting: Gastroenterology

## 2017-02-13 ENCOUNTER — Encounter: Payer: Self-pay | Admitting: Gastroenterology

## 2017-02-13 VITALS — BP 120/70 | HR 68 | Ht 62.0 in | Wt 114.0 lb

## 2017-02-13 DIAGNOSIS — K589 Irritable bowel syndrome without diarrhea: Secondary | ICD-10-CM | POA: Diagnosis not present

## 2017-02-13 DIAGNOSIS — G4700 Insomnia, unspecified: Secondary | ICD-10-CM | POA: Diagnosis not present

## 2017-02-13 NOTE — Progress Notes (Signed)
HPI :  76 y/o female here for a follow up visit. She has h/o stage IV breast cancer on chemotherapy, diverticular disease, IBS. She was previously seen a few months ago for worsening abdominal bloating, gas pains / cramps. See prior intake note for details of her case. CT scan showed diverticulitis of the rectosigmoid colon on 7/1. Spine lesions also noted c/w metastatic disease. She had a follow up ER visit on 11/19/16, had another CT showing resolution of thickening of the rectosigmoid colon with no acute changes. She has history of severe diverticular disease and IBS.  Since her last visit she had a trial of VSL#3, Rifaximin, simethicone, and Beano for her bloating, none of which provided significant benefit. She had a trial of low FODMAP diet which did not help. She ruled out for celiac disease with labs.   I spoke with her on 9/27 on the phone - recommended beano routinely and prior to bed, and added IB gard.  Since that time she has been feeling better. She is taking ID gard a few times per day, she feels this has definitely provided some relief. She has questions about what she should eat, and insomnia which is a main issue at this time. She otherwise is feeling at baseline. She is on chemotherapy - Fulvestrant injections and Ibrance three times per week.   Endoscopic history: EGD 03/17/2013 - 3cm hiatal hernia, some mild antral erythema Colonoscopy 03/15/2012 - pancolonic diverticulosis, one small polyp - benign, no adenomas Colonoscopy 05/20/2008 - "severe" diverticulosis  Past Medical History:  Diagnosis Date  . Allergy   . Anxiety   . Bone cancer ([place]) mri 11/11/14   right parietal bone,left cribriform plate metastases  . Cancer [facility]) 2007   right breat ca  , lumpectomy and radiation tx (declined chemo and additional prophylactic meds)  . CEREBROVASCULAR DISEASE 03/03/2010  . Diverticulitis   . DIVERTICULOSIS, COLON 03/03/2010  . Fatty liver 08/02/09   as per U/S done by [facility]  . Foramen ovale    positive bubble study  . Headache(784.0)    she thinks sinus headaches  . History of cerebral artery stenosis    right middle  . HYPERLIPIDEMIA 03/03/2010  . Hypertension   . HYPOTHYROIDISM 03/03/2010   no longer on meds  . Internal hemorrhoid   . Low back pain   . Mastoiditis    noted on brain MRI  . Rib fracture   . Right leg pain   . Shortness of breath   . Stroke [facility]) Oct. 2011   TIA  . Ulcer      Past Surgical History:  Procedure Laterality Date  . BREAST LUMPECTOMY     right  . CATARACT EXTRACTION Left   . CHOLECYSTECTOMY    . COLONOSCOPY    . SHOULDER SURGERY     right shoulder (dislocation)  . TONSILLECTOMY    . TUBAL LIGATION    . VIDEO BRONCHOSCOPY WITH ENDOBRONCHIAL ULTRASOUND N/A 01/07/2014   Procedure: VIDEO BRONCHOSCOPY WITH ENDOBRONCHIAL ULTRASOUND;  Surgeon: Ivin Poot, MD;  Location: [facility] OR;  Service: Thoracic;  Laterality: N/A;   Family History  Problem Relation Age of Onset  . Heart disease Sister        cerebral vascular disease also  . Heart disease Brother        cerebral vascular disease also  . Heart disease Brother        cerebral vascular disease  . Heart disease Sister  cebreal vascular disease also  . Heart disease Sister        cebreal vascular diease also  . Heart disease Sister        cerebral vascular diease also  . Stomach cancer Father   . Colon cancer Neg Hx   . Esophageal cancer Neg Hx   . Rectal cancer Neg Hx    Social History  Substance Use Topics  . Smoking status: Former Smoker    Packs/day: 0.50    Years: 20.00    Types: Cigarettes    Quit date: 05/01/1991  . Smokeless tobacco: Never Used  . Alcohol use No   Current Outpatient Prescriptions  Medication Sig Dispense Refill  . ALPRAZolam (XANAX) 0.25 MG tablet TAKE 1 TABLET BY MOUTH THREE TIMES DAILY AS NEEDED FOR ANXIETY 90 tablet 0  . aspirin EC 81 MG tablet Take 81 mg by mouth daily after breakfast.     . CARTIA XT 120  MG 24 hr capsule TAKE 1 CAPSULE BY MOUTH DAILY (Patient taking differently: TAKE 120 MG BY MOUTH DAILY) 90 capsule 1  . Cholecalciferol (VITAMIN D PO) Take 1,000 Units by mouth 2 (two) times daily.    Marland Kitchen docusate sodium (COLACE) 100 MG capsule Take 100 mg by mouth daily as needed for mild constipation.    . FULVESTRANT IM Inject into the muscle.    . Glucosamine 500 MG CAPS Take 500 mg by mouth daily.     . meclizine (ANTIVERT) 25 MG tablet TAKE 1 TABLET(25 MG) BY MOUTH EVERY 6 HOURS AS NEEDED FOR DIZZINESS 60 tablet 0  . omeprazole (PRILOSEC) 20 MG capsule Take 1 capsule (20 mg total) by mouth daily. (Patient taking differently: Take 20 mg by mouth daily as needed (reflux). ) 90 capsule 4  . OVER THE COUNTER MEDICATION Beno for gas, takes twice daily    . palbociclib (IBRANCE) 75 MG capsule Take 1 capsule (75 mg total) by mouth 3 (three) times a week. Take every mon,wed,fri for 3 weeks, then take 1 week off. 21 capsule 0  . peppermint oil liquid Take by mouth as needed. IBgard as directed 30 mL 12  . Probiotic Product (PROBIOTIC DAILY PO) Take 1 tablet by mouth daily.      No current facility-administered medications for this visit.    Allergies  Allergen Reactions  . Ciprofloxacin Anaphylaxis     Review of Systems: All systems reviewed and negative except where noted in HPI.   Lab Results  Component Value Date   WBC 3.2 (L) 01/24/2017   HGB 13.1 01/24/2017   HCT 37.5 01/24/2017   MCV 103.6 (H) 01/24/2017   PLT 157 01/24/2017    Lab Results  Component Value Date   ALT 13 01/24/2017   AST 18 01/24/2017   ALKPHOS 69 01/24/2017   BILITOT 0.50 01/24/2017    Lab Results  Component Value Date   CREATININE 0.8 01/24/2017   BUN 13.2 01/24/2017   NA 138 01/24/2017   K 5.4 (H) 01/24/2017   CL 102 11/19/2016   CO2 30 (H) 01/24/2017     Physical Exam: BP 120/70   Pulse 68   Ht 5\' 2"  (1.575 m)   Wt 114 lb (51.7 kg)   BMI 20.85 kg/m  Constitutional: Pleasant, female in no  acute distress. HEENT: Normocephalic and atraumatic. Conjunctivae are normal. No scleral icterus. Neck supple.  Cardiovascular: Normal rate, regular rhythm.  Pulmonary/chest: Effort normal and breath sounds normal. No wheezing, rales or rhonchi. Abdominal: Soft,  nondistended, nontender. There are no masses palpable. No hepatomegaly. Extremities: no edema Lymphadenopathy: No cervical adenopathy noted. Neurological: Alert and oriented to person place and time. Skin: Skin is warm and dry. No rashes noted. Psychiatric: Normal mood and affect. Behavior is normal.   ASSESSMENT AND PLAN: 76 y/o female here for reassessment of the following issues:  Chronic bloating / history of IBS / diverticulosis / abdominal pain - I suspect she may have IBS causing her symptoms, versus symptomatic uncomplicated diverticular disease, and it's also possible her chemotherapy regimen is contributing to some of her symptoms (multiple GI side effects listed). She's had several episodes of diverticulosis over several years, with multiple colonoscopies showing diverticulosis and no polyps. Her last CT scan showed resolved diverticulitis. She did not respond well to trial of Rifaximin / VSL#3, but thinks IB gard has provided significant benefit. She should continue this for now. If she worsens or recurs over time, we can consider a colonoscopy, but patient would like to hold off on this if possible which I agree with. In regards to her diet, if there are particular foods which make her symptoms worse, she should avoid them, otherwise she has no dietary restrictions. She can follow up in a few months for reassessment given her anxiety about this issue.   Insomnia - can try melatonin to see if this helps, if she needs something stronger she will need to see her primary care. She agreed.   Zena Cellar, MD [facility] Pager [phone]

## 2017-02-13 NOTE — Patient Instructions (Addendum)
Continue taking IBgard.  Dr. Havery Moros would like to see you back in the office in 3 months.  Please call our office at [phone] in December to schedule an appointment in mid January.  If you are age 76 or older, your body mass index should be between 23-30. Your Body mass index is 20.85 kg/m. If this is out of the aforementioned range listed, please consider follow up with your Primary Care Provider.  If you are age 46 or younger, your body mass index should be between 19-25. Your Body mass index is 20.85 kg/m. If this is out of the aformentioned range listed, please consider follow up with your Primary Care Provider.   Thank you.

## 2017-02-21 ENCOUNTER — Encounter: Payer: Self-pay | Admitting: Adult Health

## 2017-02-21 ENCOUNTER — Ambulatory Visit (HOSPITAL_BASED_OUTPATIENT_CLINIC_OR_DEPARTMENT_OTHER): Payer: Medicare Other

## 2017-02-21 ENCOUNTER — Ambulatory Visit (HOSPITAL_BASED_OUTPATIENT_CLINIC_OR_DEPARTMENT_OTHER): Payer: Medicare Other | Admitting: Adult Health

## 2017-02-21 ENCOUNTER — Other Ambulatory Visit (HOSPITAL_BASED_OUTPATIENT_CLINIC_OR_DEPARTMENT_OTHER): Payer: Medicare Other

## 2017-02-21 ENCOUNTER — Other Ambulatory Visit: Payer: Self-pay | Admitting: Pharmacist

## 2017-02-21 VITALS — BP 135/65 | HR 65 | Temp 97.4°F | Resp 18 | Ht 62.0 in | Wt 118.5 lb

## 2017-02-21 DIAGNOSIS — C7951 Secondary malignant neoplasm of bone: Secondary | ICD-10-CM

## 2017-02-21 DIAGNOSIS — C50911 Malignant neoplasm of unspecified site of right female breast: Secondary | ICD-10-CM

## 2017-02-21 DIAGNOSIS — R5383 Other fatigue: Secondary | ICD-10-CM | POA: Diagnosis not present

## 2017-02-21 DIAGNOSIS — Z5111 Encounter for antineoplastic chemotherapy: Secondary | ICD-10-CM

## 2017-02-21 DIAGNOSIS — M545 Low back pain: Secondary | ICD-10-CM | POA: Diagnosis not present

## 2017-02-21 DIAGNOSIS — K5900 Constipation, unspecified: Secondary | ICD-10-CM | POA: Diagnosis not present

## 2017-02-21 DIAGNOSIS — C50411 Malignant neoplasm of upper-outer quadrant of right female breast: Secondary | ICD-10-CM | POA: Diagnosis not present

## 2017-02-21 DIAGNOSIS — Z853 Personal history of malignant neoplasm of breast: Secondary | ICD-10-CM

## 2017-02-21 LAB — CBC WITH DIFFERENTIAL/PLATELET
BASO%: 2.2 % — ABNORMAL HIGH (ref 0.0–2.0)
BASOS ABS: 0.1 10*3/uL (ref 0.0–0.1)
EOS ABS: 0.2 10*3/uL (ref 0.0–0.5)
EOS%: 7 % (ref 0.0–7.0)
HEMATOCRIT: 38.6 % (ref 34.8–46.6)
HGB: 13.1 g/dL (ref 11.6–15.9)
LYMPH%: 41 % (ref 14.0–49.7)
MCH: 35.9 pg — AB (ref 25.1–34.0)
MCHC: 33.9 g/dL (ref 31.5–36.0)
MCV: 105.8 fL — ABNORMAL HIGH (ref 79.5–101.0)
MONO#: 0.2 10*3/uL (ref 0.1–0.9)
MONO%: 7.3 % (ref 0.0–14.0)
NEUT#: 1.3 10*3/uL — ABNORMAL LOW (ref 1.5–6.5)
NEUT%: 42.5 % (ref 38.4–76.8)
PLATELETS: 155 10*3/uL (ref 145–400)
RBC: 3.65 10*6/uL — ABNORMAL LOW (ref 3.70–5.45)
RDW: 12.9 % (ref 11.2–14.5)
WBC: 3.2 10*3/uL — ABNORMAL LOW (ref 3.9–10.3)
lymph#: 1.3 10*3/uL (ref 0.9–3.3)

## 2017-02-21 LAB — COMPREHENSIVE METABOLIC PANEL
ALT: 18 U/L (ref 0–55)
ANION GAP: 7 meq/L (ref 3–11)
AST: 18 U/L (ref 5–34)
Albumin: 3.7 g/dL (ref 3.5–5.0)
Alkaline Phosphatase: 73 U/L (ref 40–150)
BUN: 16 mg/dL (ref 7.0–26.0)
CHLORIDE: 103 meq/L (ref 98–109)
CO2: 30 meq/L — AB (ref 22–29)
CREATININE: 0.8 mg/dL (ref 0.6–1.1)
Calcium: 9.2 mg/dL (ref 8.4–10.4)
EGFR: >60 mL/min/{1.73_m2} (ref >60)
Glucose: 80 mg/dl (ref 70–140)
Potassium: 4.4 mEq/L (ref 3.5–5.1)
SODIUM: 140 meq/L (ref 136–145)
Total Bilirubin: 0.5 mg/dL (ref 0.20–1.20)
Total Protein: 6.9 g/dL (ref 6.4–8.3)

## 2017-02-21 MED ORDER — FULVESTRANT 250 MG/5ML IM SOLN
500.0000 mg | Freq: Once | INTRAMUSCULAR | Status: AC
  Administered 2017-02-21: 500 mg via INTRAMUSCULAR
  Filled 2017-02-21: qty 10

## 2017-02-21 NOTE — Progress Notes (Signed)
Erica Young  [phone] [phone]     ID: Erica Young DOB: [birth year]  MR#: [id number]  QIH#:[id number]  Patient Care Team: Marletta Lor, MD as PCP - General Tanda Rockers, MD as Consulting Physician (Pulmonary Disease) Magrinat, Virgie Dad, MD as Consulting Physician (Oncology) Armbruster, Carlota Raspberry, MD as Consulting Physician (Gastroenterology) Scot Dock, NP OTHER MD:  CHIEF COMPLAINT: Estrogen receptor positive stage IV breast cancer   CURRENT TREATMENT: Fulvestrant; refuses Delton See; started palbociclib/ Ibrance February 2018    INTERVAL HISTORY: Erica Young returns today for follow-up and treatment of her estrogen receptor positive metastatic breast cancer, accompanied by her husband. She continues on fulvestrant monthly along with Palbociclib 3 weeks on 1 week off. She prefers Erica Young to give her injections.     REVIEW OF SYSTEMS: Manami is experiencing some constipation and continues to follow up with her gastroenterologist for this.  She also notes some fatigue.  This is actually improved lately.  She bent down wrong yesterday and has some subsequent back pain that is slowly improving.  She denies any other issues today.    BREAST CANCER HISTORY: From the earlier summary note  [place] tells me in 2007 while living in [place] she was found to have a very small cancer in the upper-outer quadrant of the right breast, about the size of the P, noted on mammography. It was not palpable. She had a right lumpectomy and full sentinel lymph node sampling. She then received adjuvant radiation, to a total of 33 treatments. She received no systemic therapy.  She then did well until December 2014, when she had a fall and complain of pain in her left ribs. Rib films and chest x-ray on 04/13/2013 showed no fractures, but CT of the abdomen and pelvis on the same day found subcarinal and right hilar adenopathy. This was followed up with a chest CT scan  05/05/2013 confirming extensive mediastinal and right hilar lymphadenopathy, with multiple pulmonary nodules, the largest measuring 0.9 cm. PET scan 05/21/2013 showed hypermetabolic adenopathy in the right and left paratracheal areas, the precarinal, subcarinal and right hilar areas, but no involvement of the liver, and the lung nodules were not hypermetabolic (although they were below the level of reliable detection). In addition, at L5 there was a lucency measuring 1.2 cm.  The PET scan also showed a hypermetabolic focus on the thyroid gland, which was evaluated with neck ultrasound and biopsy 87/56/4332, showing a follicular lesion of undetermined significance ((NZA [id number]).  The patient was referred to pulmonary [Dr Melvyn Novas and Dr Julien Nordmann for further evaluation. Repeat PET scan 12/28/2013 showed, in addition to the adenopathy previously noted, now multiple bony metastases. On 01/07/2014 the patient underwent bronchoscopy and this showed (SZA [id number], together with separate cytology] a low-grade mucinous invasive breast cancer, estrogen receptor 100% positive, progesterone receptor 14% positive, with no HER-2 amplification, the signals ratio being 1.30 and the number per cell 1.95.  The patient was started on anastrozole 01/22/2014; monthly denosumab/Xgeva was added 07/30/2014. She appeared to tolerate this well, and her CA-27-29 (127 at baseline) normalized. Most recent CT scans of chest abdomen and pelvis 10/03/2015 showed continuing response. Despite this good news however, the patient decided to go off anastrozole and denosumab/Xgeva as of June 2017. By the time she saw Dr. Earlie Server again in September 2017 her tumor marker had doubled. At that time the patient was referred to the breast clinic for a second opinion regarding further evaluation and treatment.   PAST MEDICAL  HISTORY: Past Medical History:  Diagnosis Date  . Allergy   . Anxiety   . Bone cancer ([place]) mri 11/11/14   right parietal  bone,left cribriform plate metastases  . Cancer [facility]) 2007   right breat ca  , lumpectomy and radiation tx (declined chemo and additional prophylactic meds)  . CEREBROVASCULAR DISEASE 03/03/2010  . Diverticulitis   . DIVERTICULOSIS, COLON 03/03/2010  . Fatty liver 08/02/09   as per U/S done by [facility]  . Foramen ovale    positive bubble study  . Headache(784.0)    she thinks sinus headaches  . History of cerebral artery stenosis    right middle  . HYPERLIPIDEMIA 03/03/2010  . Hypertension   . HYPOTHYROIDISM 03/03/2010   no longer on meds  . Internal hemorrhoid   . Low back pain   . Mastoiditis    noted on brain MRI  . Rib fracture   . Right leg pain   . Shortness of breath   . Stroke [facility]) Oct. 2011   TIA  . Ulcer     PAST SURGICAL HISTORY: Past Surgical History:  Procedure Laterality Date  . BREAST LUMPECTOMY     right  . CATARACT EXTRACTION Left   . CHOLECYSTECTOMY    . COLONOSCOPY    . SHOULDER SURGERY     right shoulder (dislocation)  . TONSILLECTOMY    . TUBAL LIGATION    . VIDEO BRONCHOSCOPY WITH ENDOBRONCHIAL ULTRASOUND N/A 01/07/2014   Procedure: VIDEO BRONCHOSCOPY WITH ENDOBRONCHIAL ULTRASOUND;  Surgeon: Ivin Poot, MD;  Location: [facility] OR;  Service: Thoracic;  Laterality: N/A;    FAMILY HISTORY Family History  Problem Relation Age of Onset  . Heart disease Sister        cerebral vascular disease also  . Heart disease Brother        cerebral vascular disease also  . Heart disease Brother        cerebral vascular disease  . Heart disease Sister        cebreal vascular disease also  . Heart disease Sister        cebreal vascular diease also  . Heart disease Sister        cerebral vascular diease also  . Stomach cancer Father   . Colon cancer Neg Hx   . Esophageal cancer Neg Hx   . Rectal cancer Neg Hx   The patient's father died from stomach cancer in his late 58s. The patient's mother died in her 68s from a stroke. The patient has 2  brothers, 4 sisters. One sister had leukemia. There is no history of breast, colon, or ovarian cancer in the family to her knowledge  GYNECOLOGIC HISTORY:  No LMP recorded. Patient is postmenopausal. Menarche age 39, first live birth age 13, the patient is [place] P2. She went through menopause in her late 54s. She took no hormone replacement. She took oral contraceptives for approximately 2 years remotely without complications.  SOCIAL HISTORY:  Cheyanne is originally from [place]. 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 Her husband Mortimer Fries is a retired substance abuse Social worker. This is a second marriage for both of them. Markee has 2 children from her first marriage, [name], lives in [place], and worked as a Audiological scientist but is now disabled, and Lawrence Santiago, who lives in [place] and works in Engineer, mining. The patient has 6 grandchildren. She attends [employer].--Mikki Santee has 3 children from  his first marriage. [name] Junior lives in [place] and has his own trucking business. He tells me he is estranged from his 2 daughters Amedeo Gory (a retired Pharmacist, hospital) and Salena Saner (who works in a bank). They living [place].   ADVANCED DIRECTIVES: in place   HEALTH MAINTENANCE: Social History  Substance Use Topics  . Smoking status: Former Smoker    Packs/day: 0.50    Years: 20.00    Types: Cigarettes    Quit date: 05/01/1991  . Smokeless tobacco: Never Used  . Alcohol use No     Colonoscopy:  PAP:  Bone density:   Allergies  Allergen Reactions  . Ciprofloxacin Anaphylaxis    Current Outpatient Prescriptions  Medication Sig Dispense Refill  . ALPRAZolam (XANAX) 0.25 MG tablet TAKE 1 TABLET BY MOUTH THREE TIMES DAILY AS NEEDED FOR ANXIETY 90 tablet 0  . aspirin EC 81 MG tablet Take 81 mg by mouth daily after breakfast.     . CARTIA XT 120 MG 24 hr capsule TAKE 1 CAPSULE BY MOUTH DAILY (Patient taking differently: TAKE 120 MG BY  MOUTH DAILY) 90 capsule 1  . Cholecalciferol (VITAMIN D PO) Take 1,000 Units by mouth 2 (two) times daily.    Marland Kitchen docusate sodium (COLACE) 100 MG capsule Take 100 mg by mouth daily as needed for mild constipation.    . FULVESTRANT IM Inject into the muscle.    . Glucosamine 500 MG CAPS Take 500 mg by mouth daily.     . meclizine (ANTIVERT) 25 MG tablet TAKE 1 TABLET(25 MG) BY MOUTH EVERY 6 HOURS AS NEEDED FOR DIZZINESS 60 tablet 0  . omeprazole (PRILOSEC) 20 MG capsule Take 1 capsule (20 mg total) by mouth daily. (Patient taking differently: Take 20 mg by mouth daily as needed (reflux). ) 90 capsule 4  . OVER THE COUNTER MEDICATION Beno for gas, takes twice daily    . palbociclib (IBRANCE) 75 MG capsule Take 1 capsule (75 mg total) by mouth 3 (three) times a week. Take every mon,wed,fri for 3 weeks, then take 1 week off. 21 capsule 0  . peppermint oil liquid Take by mouth as needed. IBgard as directed 30 mL 12  . Probiotic Product (PROBIOTIC DAILY PO) Take 1 tablet by mouth daily.      No current facility-administered medications for this visit.    Facility-Administered Medications Ordered in Other Visits  Medication Dose Route Frequency Provider Last Rate Last Dose  . fulvestrant (FASLODEX) injection 500 mg  500 mg Intramuscular Once Magrinat, Virgie Dad, MD        OBJECTIVE:   Vitals:   02/21/17 0923  BP: 135/65  Pulse: 65  Resp: 18  Temp: (!) 97.4 F (36.3 C)  SpO2: 98%     Body mass index is 21.67 kg/m.    ECOG FS:1 - Symptomatic but completely ambulatory GENERAL: Patient is a well appearing female in no acute distress HEENT:  Sclerae anicteric.  Oropharynx clear and moist. No ulcerations or evidence of oropharyngeal candidiasis. Neck is supple.  NODES:  No cervical, supraclavicular, or axillary lymphadenopathy palpated.  BREAST EXAM:  Deferred. LUNGS:  Clear to auscultation bilaterally.  No wheezes or rhonchi. HEART:  Regular rate and rhythm. No murmur appreciated. ABDOMEN:   Soft, nontender.  Positive, normoactive bowel sounds. No organomegaly palpated. MSK:  No focal spinal tenderness to palpation.  EXTREMITIES:  No peripheral edema.   SKIN:  Clear with no obvious rashes or skin changes. No nail dyscrasia. NEURO:  Nonfocal. Well  oriented.  Appropriate affect.       LAB RESULTS:  CMP     Component Value Date/Time   NA 140 02/21/2017 0821   K 4.4 02/21/2017 0821   CL 102 11/19/2016 1750   CO2 30 (H) 02/21/2017 0821   GLUCOSE 80 02/21/2017 0821   BUN 16.0 02/21/2017 0821   CREATININE 0.8 02/21/2017 0821   CALCIUM 9.2 02/21/2017 0821   PROT 6.9 02/21/2017 0821   ALBUMIN 3.7 02/21/2017 0821   AST 18 02/21/2017 0821   ALT 18 02/21/2017 0821   ALKPHOS 73 02/21/2017 0821   BILITOT 0.50 02/21/2017 0821   GFRNONAA >60 11/19/2016 1750   GFRAA >60 11/19/2016 1750    INo results found for: SPEP, UPEP  Lab Results  Component Value Date   WBC 3.2 (L) 02/21/2017   NEUTROABS 1.3 (L) 02/21/2017   HGB 13.1 02/21/2017   HCT 38.6 02/21/2017   MCV 105.8 (H) 02/21/2017   PLT 155 02/21/2017      Chemistry      Component Value Date/Time   NA 140 02/21/2017 0821   K 4.4 02/21/2017 0821   CL 102 11/19/2016 1750   CO2 30 (H) 02/21/2017 0821   BUN 16.0 02/21/2017 0821   CREATININE 0.8 02/21/2017 0821      Component Value Date/Time   CALCIUM 9.2 02/21/2017 0821   ALKPHOS 73 02/21/2017 0821   AST 18 02/21/2017 0821   ALT 18 02/21/2017 0821   BILITOT 0.50 02/21/2017 0821       Lab Results  Component Value Date   LABCA2 25 08/30/2015    No components found for: QQPYP950  No results for input(s): INR in the last 168 hours.  Urinalysis    Component Value Date/Time   COLORURINE STRAW (A) 11/19/2016 1750   APPEARANCEUR CLEAR 11/19/2016 1750   LABSPEC 1.003 (L) 11/19/2016 1750   PHURINE 7.0 11/19/2016 1750   GLUCOSEU NEGATIVE 11/19/2016 1750   HGBUR NEGATIVE 11/19/2016 1750   BILIRUBINUR NEGATIVE 11/19/2016 1750   BILIRUBINUR neg  02/16/2015 1318   KETONESUR NEGATIVE 11/19/2016 1750   PROTEINUR NEGATIVE 11/19/2016 1750   UROBILINOGEN 0.2 02/16/2015 1318   UROBILINOGEN 0.2 11/18/2013 1321   NITRITE NEGATIVE 11/19/2016 1750   LEUKOCYTESUR TRACE (A) 11/19/2016 1750     STUDIES: CA-27-29 continues to drop, the most recent reading being 46.2  ELIGIBLE FOR AVAILABLE RESEARCH PROTOCOL: no  ASSESSMENT: 76 y.o. [place] woman  (1) status post right breast upper outer quadrant lumpectomy and axillary lymph node dissection in 2007 for what appears to have been a T1 N0, stage IA invasive ductal carcinoma, treated adjuvantly with radiation (33 sessions)  METASTATIC DISEASE definitively documented Sept 2015 (2) bronchoscopic biopsy 01/07/2014 showed a low-grade mucinous breast cancer, strongly estrogen receptor positive, progesterone receptor positive, HER-2 negative; staging studies confirmed extensive hypermetabolic adenopathy, multiple bone lesions, likely early lung involvement, but no liver lesions.  (a) bone scan and chest CT 07/09/2016 shows stable disease.  (3) on Arimidex between September 2015 and June 2017, with evidence of response; discontinued secondary to side effects  (a) bone density 04/10/2016 shows osteopenia with a T score of -2.1  (4) on monthly denosumab/Xgeva between 07/30/2014 and 10/04/2015, discontinued due to patient's concerns regarding osteonecrosis of the jaw  (a) discussed again October 2017, the patient adamantly refusing denosumab  (5) started fulvestrant 01/26/2016   (a) Palbociclib added at 75 mg M/W/F, beginning mid February 2018  PLAN  Citlali is doing well today.  She will proceed with  Fulvestrant today.  She will continue taking Palbociclib three times per week.  She is tolerating this well.  Her labs are stable and I reviewed this with her husband today.  She will f/u GI about her constipation.    Mirna Mires will see Dr. Jana Hakim around Thanksgiving when she gets treated. At that  point he willl consider whether or not she will have staging studies towards the end of the year.  She knows to call for any other issues that may develop before her next visit here.  A total of (30) minutes of face-to-face time was spent with this patient with greater than 50% of that time in counseling and care-coordination.  Scot Dock  02/21/2017 Medical Oncology and Hematology [facility] [address] [place], [place] [address] Tel. [phone]    Fax. [phone]

## 2017-02-21 NOTE — Patient Instructions (Signed)

## 2017-02-22 ENCOUNTER — Ambulatory Visit (INDEPENDENT_AMBULATORY_CARE_PROVIDER_SITE_OTHER): Payer: Medicare Other

## 2017-02-22 DIAGNOSIS — Z23 Encounter for immunization: Secondary | ICD-10-CM

## 2017-02-22 LAB — CANCER ANTIGEN 27.29: CAN 27.29: 43 U/mL — AB (ref 0.0–38.6)

## 2017-02-25 ENCOUNTER — Telehealth: Payer: Self-pay | Admitting: Gastroenterology

## 2017-02-25 ENCOUNTER — Other Ambulatory Visit: Payer: Self-pay

## 2017-02-25 ENCOUNTER — Other Ambulatory Visit: Payer: Self-pay | Admitting: Internal Medicine

## 2017-02-25 NOTE — Telephone Encounter (Signed)
Spoke to patient I did try to send this prescription in but unfortunately it is only OTC. I called her pharmacy and spoke to pharmacist who did confirm that this is not covered by insurance. Patient advised.

## 2017-02-26 DIAGNOSIS — H04123 Dry eye syndrome of bilateral lacrimal glands: Secondary | ICD-10-CM | POA: Diagnosis not present

## 2017-02-28 MED FILL — IBRANCE 75 MG CAPSULE: 75 | 28 days supply | Qty: 9 | Fill #1

## 2017-03-06 DIAGNOSIS — H25041 Posterior subcapsular polar age-related cataract, right eye: Secondary | ICD-10-CM | POA: Diagnosis not present

## 2017-03-06 DIAGNOSIS — H2511 Age-related nuclear cataract, right eye: Secondary | ICD-10-CM | POA: Diagnosis not present

## 2017-03-06 DIAGNOSIS — H25011 Cortical age-related cataract, right eye: Secondary | ICD-10-CM | POA: Diagnosis not present

## 2017-03-06 DIAGNOSIS — H16221 Keratoconjunctivitis sicca, not specified as Sjogren's, right eye: Secondary | ICD-10-CM | POA: Diagnosis not present

## 2017-03-14 ENCOUNTER — Other Ambulatory Visit: Payer: Self-pay | Admitting: Oncology

## 2017-03-14 NOTE — Progress Notes (Signed)
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 She continues on Faslodex, which she tolerates well.   She is also on palbociclib, currently on the 75 mg dose, which she takes 3 times a week, 3 weeks on and one-week off. She does experience itchiness all over her body.  She has also begun to lose a little bit of hair.  She understands that this does: Cycles.   REVIEW OF SYSTEMS: Erica Young is doing well. She reports having a lot of energy, staying hydrated and walking as much as she can. She denies unusual headaches, visual changes, nausea, vomiting, or dizziness. There has been no unusual cough, phlegm production, or pleurisy. This been no change in bowel or bladder habits. She denies unexplained fatigue or unexplained weight loss, bleeding, rash, or fever. A detailed review of systems was otherwise entirely stable.   BREAST CANCER HISTORY: From the earlier summary note  Erica Young tells me in 2007 while living in [place] she was found to have a very small cancer in the upper-outer quadrant of the right breast, about the size of the P, noted on mammography. It was not palpable. She had a right lumpectomy and full sentinel lymph node sampling. She then received adjuvant radiation, to a total of 33 treatments. She received no  systemic therapy.  She then did well until December 2014, when she had a fall and complain of pain in her left ribs. Rib films and chest x-ray on 04/13/2013 showed no fractures, but CT of the abdomen and pelvis on the same day found subcarinal and right hilar adenopathy. This was followed up with a chest CT scan 05/05/2013 confirming extensive mediastinal and right hilar lymphadenopathy, with multiple pulmonary nodules, the largest measuring 0.9 cm. PET scan 05/21/2013 showed hypermetabolic adenopathy in the right and left paratracheal areas, the precarinal, subcarinal and right hilar areas, but no involvement of the liver, and the lung nodules were not hypermetabolic (although they were below the level of reliable detection). In addition, at L5 there was a lucency measuring 1.2 cm.  The PET scan also showed a hypermetabolic focus on the thyroid gland, which was evaluated with neck ultrasound and biopsy 22/29/7989, showing a follicular lesion of undetermined significance (([id number]).  The patient was referred to pulmonary [Dr Melvyn Novas and Dr Julien Nordmann for further evaluation. Repeat PET scan 12/28/2013 showed, in addition to the adenopathy previously noted, now multiple bony metastases. On 01/07/2014 the patient underwent bronchoscopy and this showed ([id number], together with separate cytology] a low-grade mucinous invasive breast cancer, estrogen receptor 100% positive, progesterone receptor 14% positive, with no HER-2 amplification, the signals ratio being 1.30 and the number per cell 1.95.  The patient was started on anastrozole 01/22/2014; monthly denosumab/Xgeva was added 07/30/2014. She appeared to tolerate this well, and her CA-27-29 (127 at baseline) normalized. Most recent CT scans of chest abdomen and pelvis 10/03/2015 showed continuing response. Despite this good news however,  the patient decided to go off anastrozole and denosumab/Xgeva as of June 2017. By the time she saw Dr. Earlie Server again in  September 2017 her tumor marker had doubled. At that time the patient was referred to the breast clinic for a second opinion regarding further evaluation and treatment.   PAST MEDICAL HISTORY: Past Medical History:  Diagnosis Date  . Allergy   . Anxiety   . Bone cancer ([place]) mri 11/11/14   right parietal bone,left cribriform plate metastases  . Cancer [facility]) 2007   right breat ca  , lumpectomy and radiation tx (declined chemo and additional prophylactic meds)  . CEREBROVASCULAR DISEASE 03/03/2010  . Diverticulitis   . DIVERTICULOSIS, COLON 03/03/2010  . Fatty liver 08/02/09   as per U/S done by [facility]  . Foramen ovale    positive bubble study  . Headache(784.0)    she thinks sinus headaches  . History of cerebral artery stenosis    right middle  . HYPERLIPIDEMIA 03/03/2010  . Hypertension   . HYPOTHYROIDISM 03/03/2010   no longer on meds  . Internal hemorrhoid   . Low back pain   . Mastoiditis    noted on brain MRI  . Rib fracture   . Right leg pain   . Shortness of breath   . Stroke [facility]) Oct. 2011   TIA  . Ulcer     PAST SURGICAL HISTORY: Past Surgical History:  Procedure Laterality Date  . BREAST LUMPECTOMY     right  . CATARACT EXTRACTION Left   . CHOLECYSTECTOMY    . COLONOSCOPY    . SHOULDER SURGERY     right shoulder (dislocation)  . TONSILLECTOMY    . TUBAL LIGATION    . VIDEO BRONCHOSCOPY WITH ENDOBRONCHIAL ULTRASOUND N/A 01/07/2014   Procedure: VIDEO BRONCHOSCOPY WITH ENDOBRONCHIAL ULTRASOUND;  Surgeon: Ivin Poot, MD;  Location: [facility] OR;  Service: Thoracic;  Laterality: N/A;    FAMILY HISTORY Family History  Problem Relation Age of Onset  . Heart disease Sister        cerebral vascular disease also  . Heart disease Brother        cerebral vascular disease also  . Heart disease Brother        cerebral vascular disease  . Heart disease Sister        cebreal vascular disease also  . Heart disease Sister        cebreal vascular  diease also  . Heart disease Sister        cerebral vascular diease also  . Stomach cancer Father   . Colon cancer Neg Hx   . Esophageal cancer Neg Hx   . Rectal cancer Neg Hx   The patient's father died from stomach cancer in his late 46s. The patient's mother died in her 37s from a stroke. The patient has 2 brothers, 4 sisters. One sister had leukemia. There is no history of breast, colon, or ovarian cancer in the family to her knowledge  GYNECOLOGIC HISTORY:  No LMP recorded. Patient is postmenopausal. Menarche age 89, first live birth age 48, the patient is Erica Young. She went through menopause in her late 58s. She took no hormone replacement. She took oral contraceptives for approximately 2 years remotely without complications.  SOCIAL HISTORY:  Erica Young is originally from [place], [place]. She worked as a Education officer, museum, particularly, in the field of substance abuse. Her husband, Mortimer Fries, is a retired substance abuse Social worker. This is a second marriage for  both of them. Marjoria has 2 children from her first marriage, [name], lives in [place], and worked as a Audiological scientist but is now disabled, and Lawrence Santiago, who lives in [place] and works in Engineer, mining. The patient has 6 grandchildren. She attends [employer]Mikki Santee has 3 children from his first marriage. [name] Junior lives in [place] and has his own trucking business. He tells me he is estranged from his 2 daughters Amedeo Gory (a retired Pharmacist, hospital) and Salena Saner (who works in a bank). They are living on [place].   ADVANCED DIRECTIVES: in place   HEALTH MAINTENANCE: Social History   Tobacco Use  . Smoking status: Former Smoker    Packs/day: 0.50    Years: 20.00    Pack years: 10.00    Types: Cigarettes    Last attempt to quit: 05/01/1991    Years since quitting: 25.9  . Smokeless tobacco: Never Used  Substance Use Topics  . Alcohol use: No    Alcohol/week: 0.0 oz  . Drug use: No      Colonoscopy:  PAP:  Bone density:   Allergies  Allergen Reactions  . Ciprofloxacin Anaphylaxis    Current Outpatient Medications  Medication Sig Dispense Refill  . ALPRAZolam (XANAX) 0.25 MG tablet TAKE 1 TABLET BY MOUTH THREE TIMES DAILY AS NEEDED FOR ANXIETY 90 tablet 0  . aspirin EC 81 MG tablet Take 81 mg by mouth daily after breakfast.     . CARTIA XT 120 MG 24 hr capsule TAKE 1 CAPSULE BY MOUTH DAILY (Patient taking differently: TAKE 120 MG BY MOUTH DAILY) 90 capsule 1  . cetirizine (ZYRTEC) 10 MG tablet Take 10 mg by mouth daily.    . Cholecalciferol (VITAMIN D PO) Take 1,000 Units by mouth 2 (two) times daily.    Marland Kitchen docusate sodium (COLACE) 100 MG capsule Take 100 mg by mouth daily as needed for mild constipation.    . FULVESTRANT IM Inject into the muscle.    . Glucosamine 500 MG CAPS Take 500 mg by mouth daily.     . meclizine (ANTIVERT) 25 MG tablet TAKE 1 TABLET(25 MG) BY MOUTH EVERY 6 HOURS AS NEEDED FOR DIZZINESS 60 tablet 0  . omeprazole (PRILOSEC) 20 MG capsule Take 1 capsule (20 mg total) by mouth daily. (Patient taking differently: Take 20 mg by mouth daily as needed (reflux). ) 90 capsule 4  . OVER THE COUNTER MEDICATION Beno for gas, takes twice daily    . palbociclib (IBRANCE) 75 MG capsule Take 1 capsule (75 mg total) by mouth 3 (three) times a week. Take every mon,wed,fri for 3 weeks, then take 1 week off. 21 capsule 0  . peppermint oil liquid Take by mouth as needed. IBgard as directed 30 mL 12  . Probiotic Product (PROBIOTIC DAILY PO) Take 1 tablet by mouth daily.      No current facility-administered medications for this visit.     OBJECTIVE: Middle-aged [place] American woman, who appears stated age  3:   03/20/17 1029  BP: 139/60  Pulse: 65  Resp: 20  Temp: 98 F (36.7 C)  SpO2: 96%     Body mass index is 21.55 kg/m.    ECOG FS:1 - Symptomatic but completely ambulatory  Sclerae unicteric, EOMs intact Oropharynx clear and moist No  cervical or supraclavicular adenopathy Lungs no rales or rhonchi Heart regular rate and rhythm Abd soft, nontender, positive bowel sounds MSK no focal spinal tenderness, no upper extremity lymphedema Neuro: nonfocal, well  oriented, appropriate affect Breasts: The right breast is status post remote lumpectomy, with no evidence of local recurrence.  The left breast is benign.  Both axillae are benign.    LAB RESULTS:  CMP     Component Value Date/Time   NA 138 03/20/2017 0947   K 4.1 03/20/2017 0947   CL 102 11/19/2016 1750   CO2 27 03/20/2017 0947   GLUCOSE 100 03/20/2017 0947   BUN 16.0 03/20/2017 0947   CREATININE 0.8 03/20/2017 0947   CALCIUM 9.3 03/20/2017 0947   PROT 7.1 03/20/2017 0947   ALBUMIN 3.7 03/20/2017 0947   AST 20 03/20/2017 0947   ALT 20 03/20/2017 0947   ALKPHOS 83 03/20/2017 0947   BILITOT 0.49 03/20/2017 0947   GFRNONAA >60 11/19/2016 1750   GFRAA >60 11/19/2016 1750    INo results found for: SPEP, UPEP  Lab Results  Component Value Date   WBC 3.6 (L) 03/20/2017   NEUTROABS 1.8 03/20/2017   HGB 12.7 03/20/2017   HCT 37.0 03/20/2017   MCV 104.8 (H) 03/20/2017   PLT 153 03/20/2017      Chemistry      Component Value Date/Time   NA 138 03/20/2017 0947   K 4.1 03/20/2017 0947   CL 102 11/19/2016 1750   CO2 27 03/20/2017 0947   BUN 16.0 03/20/2017 0947   CREATININE 0.8 03/20/2017 0947      Component Value Date/Time   CALCIUM 9.3 03/20/2017 0947   ALKPHOS 83 03/20/2017 0947   AST 20 03/20/2017 0947   ALT 20 03/20/2017 0947   BILITOT 0.49 03/20/2017 0947      CA-27-29 is down to 43  No components found for: LABCA125  No results for input(s): INR in the last 168 hours.  Urinalysis    Component Value Date/Time   COLORURINE STRAW (A) 11/19/2016 1750   APPEARANCEUR CLEAR 11/19/2016 1750   LABSPEC 1.003 (L) 11/19/2016 1750   PHURINE 7.0 11/19/2016 1750   GLUCOSEU NEGATIVE 11/19/2016 1750   HGBUR NEGATIVE 11/19/2016 1750    BILIRUBINUR NEGATIVE 11/19/2016 1750   BILIRUBINUR neg 02/16/2015 1318   KETONESUR NEGATIVE 11/19/2016 1750   PROTEINUR NEGATIVE 11/19/2016 1750   UROBILINOGEN 0.2 02/16/2015 1318   UROBILINOGEN 0.2 11/18/2013 1321   NITRITE NEGATIVE 11/19/2016 1750   LEUKOCYTESUR TRACE (A) 11/19/2016 1750     STUDIES: Restaging scans pending  ELIGIBLE FOR AVAILABLE RESEARCH PROTOCOL: no  ASSESSMENT: 76 y.o. [place] woman  (1) status post right breast upper outer quadrant lumpectomy and axillary lymph node dissection in 2007 for what appears to have been a T1 N0, stage IA invasive ductal carcinoma, treated adjuvantly with radiation (33 sessions)  METASTATIC DISEASE definitively documented Sept 2015 (2) bronchoscopic biopsy 01/07/2014 showed a low-grade mucinous breast cancer, strongly estrogen receptor positive, progesterone receptor positive, HER-2 negative; staging studies confirmed extensive hypermetabolic adenopathy, multiple bone lesions, likely early lung involvement, but no liver lesions.  (a) bone scan and chest CT 07/09/2016 shows stable disease.  (b) CA-27-29 is moderately informative  (3) on Arimidex between September 2015 and June 2017, with evidence of response; discontinued secondary to side effects  (a) bone density 04/10/2016 shows osteopenia with a T score of -2.1  (4) on monthly denosumab/Xgeva between 07/30/2014 and 10/04/2015, discontinued due to patient's concerns regarding osteonecrosis of the jaw  (a) discussed again October 2017, the patient adamantly refusing denosumab  (5) started fulvestrant 01/26/2016   (a) Palbociclib added at 75 mg M/W/F, beginning mid February 2018  [place]  is now a little over 3 years out from definitive diagnosis of metastatic breast cancer, with no evidence of active disease.  This is very favorable.  We are continuing with the current treatment.  We discussed her CA-27-29 results which are favorable.  She is going to have restaging  studies the third week in December before her next treatment here.  That will consist of a CT of the chest on a bone scan.  We will call her with those news  I am not sure why she is feeling more energy.  Actually it may be some agitation.  She is concerned about weight loss but her weight is stable in our readings.  Overall I am very pleased that Baumgarner is doing as well as she is doing and I am anticipating good results from her upcoming studies.  She knows to call for any other issues that may develop before her next visit here.   Magrinat, Virgie Dad, MD  03/20/17 10:56 AM [facility] Oncology [facility] [address] [place], [place] [address] Tel. [phone]    Fax. [phone]  This document serves as a record of services personally performed by Chauncey Cruel, MD. It was created on his behalf by Margit Banda, a trained medical scribe. The creation of this record is based on the scribe's personal observations and the provider's statements to them.   I have reviewed the above documentation for accuracy and completeness, and I agree with the above.

## 2017-03-20 ENCOUNTER — Other Ambulatory Visit (HOSPITAL_BASED_OUTPATIENT_CLINIC_OR_DEPARTMENT_OTHER): Payer: Medicare Other

## 2017-03-20 ENCOUNTER — Telehealth: Payer: Self-pay | Admitting: Oncology

## 2017-03-20 ENCOUNTER — Ambulatory Visit (HOSPITAL_BASED_OUTPATIENT_CLINIC_OR_DEPARTMENT_OTHER): Payer: Medicare Other

## 2017-03-20 ENCOUNTER — Ambulatory Visit (HOSPITAL_BASED_OUTPATIENT_CLINIC_OR_DEPARTMENT_OTHER): Payer: Medicare Other | Admitting: Oncology

## 2017-03-20 VITALS — BP 139/60 | HR 65 | Temp 98.0°F | Resp 20 | Ht 62.0 in | Wt 117.8 lb

## 2017-03-20 DIAGNOSIS — C50911 Malignant neoplasm of unspecified site of right female breast: Secondary | ICD-10-CM | POA: Diagnosis not present

## 2017-03-20 DIAGNOSIS — C50411 Malignant neoplasm of upper-outer quadrant of right female breast: Secondary | ICD-10-CM

## 2017-03-20 DIAGNOSIS — Z5111 Encounter for antineoplastic chemotherapy: Secondary | ICD-10-CM | POA: Diagnosis present

## 2017-03-20 DIAGNOSIS — Z170 Estrogen receptor positive status [ER+]: Secondary | ICD-10-CM

## 2017-03-20 DIAGNOSIS — C7951 Secondary malignant neoplasm of bone: Secondary | ICD-10-CM

## 2017-03-20 LAB — COMPREHENSIVE METABOLIC PANEL
ALK PHOS: 83 U/L (ref 40–150)
ALT: 20 U/L (ref 0–55)
AST: 20 U/L (ref 5–34)
Albumin: 3.7 g/dL (ref 3.5–5.0)
Anion Gap: 8 mEq/L (ref 3–11)
BILIRUBIN TOTAL: 0.49 mg/dL (ref 0.20–1.20)
BUN: 16 mg/dL (ref 7.0–26.0)
CO2: 27 mEq/L (ref 22–29)
CREATININE: 0.8 mg/dL (ref 0.6–1.1)
Calcium: 9.3 mg/dL (ref 8.4–10.4)
Chloride: 103 mEq/L (ref 98–109)
GLUCOSE: 100 mg/dL (ref 70–140)
Potassium: 4.1 mEq/L (ref 3.5–5.1)
SODIUM: 138 meq/L (ref 136–145)
TOTAL PROTEIN: 7.1 g/dL (ref 6.4–8.3)

## 2017-03-20 LAB — CBC WITH DIFFERENTIAL/PLATELET
BASO%: 1.7 % (ref 0.0–2.0)
Basophils Absolute: 0.1 10*3/uL (ref 0.0–0.1)
EOS ABS: 0.2 10*3/uL (ref 0.0–0.5)
EOS%: 5.8 % (ref 0.0–7.0)
HCT: 37 % (ref 34.8–46.6)
HEMOGLOBIN: 12.7 g/dL (ref 11.6–15.9)
LYMPH%: 38.3 % (ref 14.0–49.7)
MCH: 36 pg — ABNORMAL HIGH (ref 25.1–34.0)
MCHC: 34.3 g/dL (ref 31.5–36.0)
MCV: 104.8 fL — AB (ref 79.5–101.0)
MONO#: 0.2 10*3/uL (ref 0.1–0.9)
MONO%: 5.6 % (ref 0.0–14.0)
NEUT%: 48.6 % (ref 38.4–76.8)
NEUTROS ABS: 1.8 10*3/uL (ref 1.5–6.5)
PLATELETS: 153 10*3/uL (ref 145–400)
RBC: 3.53 10*6/uL — ABNORMAL LOW (ref 3.70–5.45)
RDW: 12.7 % (ref 11.2–14.5)
WBC: 3.6 10*3/uL — AB (ref 3.9–10.3)
lymph#: 1.4 10*3/uL (ref 0.9–3.3)

## 2017-03-20 MED ORDER — FULVESTRANT 250 MG/5ML IM SOLN
500.0000 mg | Freq: Once | INTRAMUSCULAR | Status: AC
  Administered 2017-03-20: 500 mg via INTRAMUSCULAR
  Filled 2017-03-20: qty 10

## 2017-03-20 NOTE — Patient Instructions (Signed)

## 2017-03-20 NOTE — Telephone Encounter (Signed)
Scheduled appts per 11/21 los. Gave patient avs and calendar with appts.

## 2017-03-21 LAB — CANCER ANTIGEN 27.29: CA 27.29: 46.2 U/mL — ABNORMAL HIGH (ref 0.0–38.6)

## 2017-03-25 ENCOUNTER — Other Ambulatory Visit: Payer: Self-pay | Admitting: Oncology

## 2017-03-29 MED FILL — IBRANCE 75 MG CAPSULE: 75 | 28 days supply | Qty: 9 | Fill #0

## 2017-03-30 ENCOUNTER — Other Ambulatory Visit: Payer: Self-pay | Admitting: Internal Medicine

## 2017-04-01 ENCOUNTER — Telehealth: Payer: Self-pay | Admitting: Gastroenterology

## 2017-04-01 NOTE — Telephone Encounter (Signed)
Patient husband states patient medication is not working the way it should be and patient wants to know if she needs colonoscopy. Per ov note on 10.17.18 it states to discuss colon if symptoms worsen but patient states Dr.Armbruster wanted to to go ahead and schedule colon. Patient requesting to have nurse or Dr.Armbruster reevaluate and call back to discuss if she needs procedure.

## 2017-04-01 NOTE — Telephone Encounter (Signed)
Patient returning Julie's call

## 2017-04-01 NOTE — Telephone Encounter (Signed)
Copied from [place]. Topic: Quick Communication - Rx Refill/Question >> Apr 01, 2017 10:58 AM Patrice Paradise wrote: Has the patient contacted their pharmacy? Yes - ALPRAZolam (XANAX) 0.25 MG tablet - patient only have 2 tabs left.  Preferred Pharmacy (with phone number or street name):  [facility] - [place], [address] AT [address] [place] [address] Phone: [phone] Fax: [phone]  Agent: Please be advised that RX refills may take up to 48 hours. We ask that you follow-up with your pharmacy.

## 2017-04-01 NOTE — Telephone Encounter (Signed)
Spoke to husband, patient is sleeping. Asked him to have her call me when she wakes up.

## 2017-04-01 NOTE — Telephone Encounter (Signed)
If her symptoms are persisting we can book her directly for colonoscopy if she wants to proceed. She has a history of diverticulosis. I otherwise suspect she may have symptoms related to her chemotherapy or IBS. That being said, she has been quite anxious about these symptoms and for piece of mind for her we have previously discussed colonoscopy. You can book it for her if she wishes to proceed. She has stable breast cancer on chemotherapy - she needs CBC within one week of the procedure to ensure stable. Thanks

## 2017-04-01 NOTE — Telephone Encounter (Signed)
Patient scheduled at [facility].

## 2017-04-01 NOTE — Telephone Encounter (Signed)
Controlled substance 

## 2017-04-01 NOTE — Telephone Encounter (Signed)
Spoke to patient, she states she is taking the IBgard, had to cut back to BID due to increased diarrhea. She is still having abdominal cramping. Looking back at OV note on 10/17 discussed colonoscopy if symptoms not improving, please advise.

## 2017-04-02 ENCOUNTER — Telehealth: Payer: Self-pay | Admitting: Pharmacy Technician

## 2017-04-02 NOTE — Telephone Encounter (Signed)
Pt calling again to check on status on refill and states she is coming to the office now.

## 2017-04-02 NOTE — Telephone Encounter (Signed)
Rx printed awaiting to be signed.  

## 2017-04-02 NOTE — Telephone Encounter (Signed)
[name] Encounter  Met patient in [place] to complete application for [employer] in an effort to reduce patient's out of pocket expense for Ibrance to $0.    Application completed and faxed to [phone].   [employer] patient assistance phone number for follow up is [phone].   This encounter will be updated until final determination.   Fabio Asa. [name], [place] Patient [place] [phone] 04/02/2017 3:27 PM

## 2017-04-02 NOTE — Telephone Encounter (Signed)
Pt is in the office stating that she forgot to get the medication called in before she ran out and would like to see if she can get this medication today.  She state that she is always forgetting to call before she runs out.  Pt need Alprazolam   Pharm:  [facility].

## 2017-04-11 ENCOUNTER — Other Ambulatory Visit: Payer: Self-pay | Admitting: Internal Medicine

## 2017-04-12 NOTE — Telephone Encounter (Signed)
[name] Encounter  Received notification from [place] Patient Assistance program that patient has been successfully enrolled into their program to receive Ibrance from the manufacturer at $0 out of pocket until 04/29/2017.  The patient will be automatically evaluated for 2019 enrollment.    I have spoken with patient.  She understands that continued enrollment for 2019 is pending.    I will continue to update this encounter until full enrollment is determined.    Oral Oncology Clinic will continue to follow.  Gilmore Laroche, CPhT, [place] [name] [phone] 04/12/2017 11:37 AM

## 2017-04-16 ENCOUNTER — Telehealth: Payer: Self-pay

## 2017-04-16 NOTE — Telephone Encounter (Signed)
Pt called 12/17 stating "emily is helping get a grant".  Pt states she's lost Emily's contact info.  This RN returned call on number provided by pt in vm.  No answer, no vm.

## 2017-04-17 ENCOUNTER — Encounter: Payer: Self-pay | Admitting: Internal Medicine

## 2017-04-17 ENCOUNTER — Ambulatory Visit (INDEPENDENT_AMBULATORY_CARE_PROVIDER_SITE_OTHER): Payer: Medicare Other | Admitting: Internal Medicine

## 2017-04-17 ENCOUNTER — Other Ambulatory Visit (HOSPITAL_COMMUNITY): Payer: Medicare Other

## 2017-04-17 ENCOUNTER — Ambulatory Visit (HOSPITAL_COMMUNITY): Payer: Medicare Other

## 2017-04-17 VITALS — BP 112/72 | HR 78 | Temp 97.6°F | Ht 62.0 in | Wt 119.4 lb

## 2017-04-17 DIAGNOSIS — E785 Hyperlipidemia, unspecified: Secondary | ICD-10-CM | POA: Diagnosis not present

## 2017-04-17 DIAGNOSIS — C50911 Malignant neoplasm of unspecified site of right female breast: Secondary | ICD-10-CM | POA: Diagnosis not present

## 2017-04-17 DIAGNOSIS — I10 Essential (primary) hypertension: Secondary | ICD-10-CM | POA: Diagnosis not present

## 2017-04-17 MED ORDER — ALPRAZOLAM 0.25 MG PO TABS
0.2500 mg | ORAL_TABLET | Freq: Three times a day (TID) | ORAL | 0 refills | Status: DC | PRN
Start: 2017-04-17 — End: 2017-04-30

## 2017-04-17 NOTE — Patient Instructions (Signed)
Limit your sodium (Salt) intake  Oncology follow-up as scheduled

## 2017-04-17 NOTE — Progress Notes (Signed)
Subjective:    Patient ID: Erica Young, female    DOB: 27-Aug-1940, 76 y.o.   MRN: [id number]  HPI   76 year old patient with a history of essential hypertension. She is followed closely oncology with a history of stage IV right breast cancer. She has done remarkably well and states that she feels well today. She anticipates left eye surgery after the first of the year.  No major concerns or complaints.  Wt Readings from Last 3 Encounters:  04/17/17 119 lb 6.4 oz (54.2 kg)  03/20/17 117 lb 12.8 oz (53.4 kg)  02/21/17 118 lb 8 oz (53.8 kg)    Past Medical History:  Diagnosis Date  . Allergy   . Anxiety   . Bone cancer ([facility]) mri 11/11/14   right parietal bone,left cribriform plate metastases  . Cancer [facility]) 2007   right breat ca  , lumpectomy and radiation tx (declined chemo and additional prophylactic meds)  . CEREBROVASCULAR DISEASE 03/03/2010  . Diverticulitis   . DIVERTICULOSIS, COLON 03/03/2010  . Fatty liver 08/02/09   as per U/S done by [facility]  . Foramen ovale    positive bubble study  . Headache(784.0)    she thinks sinus headaches  . History of cerebral artery stenosis    right middle  . HYPERLIPIDEMIA 03/03/2010  . Hypertension   . HYPOTHYROIDISM 03/03/2010   no longer on meds  . Internal hemorrhoid   . Low back pain   . Mastoiditis    noted on brain MRI  . Rib fracture   . Right leg pain   . Shortness of breath   . Stroke [facility]) Oct. 2011   TIA  . Ulcer      Social History   Socioeconomic History  . Marital status: Married    Spouse name: [name]  . Number of children: 2  . Years of education: Not on file  . Highest education level: Not on file  Social Needs  . Financial resource strain: Not on file  . Food insecurity - worry: Not on file  . Food insecurity - inability: Not on file  . Transportation needs - medical: Not on file  . Transportation needs - non-medical: Not on file  Occupational History  . Occupation: Social  Worker--Retired  Tobacco Use  . Smoking status: Former Smoker    Packs/day: 0.50    Years: 20.00    Pack years: 10.00    Types: Cigarettes    Last attempt to quit: 05/01/1991    Years since quitting: 25.9  . Smokeless tobacco: Never Used  Substance and Sexual Activity  . Alcohol use: No    Alcohol/week: 0.0 oz  . Drug use: No  . Sexual activity: Yes  Other Topics Concern  . Not on file  Social History Narrative   Daily caffeine     Past Surgical History:  Procedure Laterality Date  . BREAST LUMPECTOMY     right  . CATARACT EXTRACTION Left   . CHOLECYSTECTOMY    . COLONOSCOPY    . SHOULDER SURGERY     right shoulder (dislocation)  . TONSILLECTOMY    . TUBAL LIGATION    . VIDEO BRONCHOSCOPY WITH ENDOBRONCHIAL ULTRASOUND N/A 01/07/2014   Procedure: VIDEO BRONCHOSCOPY WITH ENDOBRONCHIAL ULTRASOUND;  Surgeon: Ivin Poot, MD;  Location: [facility] OR;  Service: Thoracic;  Laterality: N/A;    Family History  Problem Relation Age of Onset  . Heart disease Sister        cerebral vascular  disease also  . Heart disease Brother        cerebral vascular disease also  . Heart disease Brother        cerebral vascular disease  . Heart disease Sister        cebreal vascular disease also  . Heart disease Sister        cebreal vascular diease also  . Heart disease Sister        cerebral vascular diease also  . Stomach cancer Father   . Colon cancer Neg Hx   . Esophageal cancer Neg Hx   . Rectal cancer Neg Hx     Allergies  Allergen Reactions  . Ciprofloxacin Anaphylaxis    Current Outpatient Medications on File Prior to Visit  Medication Sig Dispense Refill  . aspirin EC 81 MG tablet Take 81 mg by mouth daily after breakfast.     . CARTIA XT 120 MG 24 hr capsule TAKE 1 CAPSULE BY MOUTH DAILY 90 capsule 0  . cetirizine (ZYRTEC) 10 MG tablet Take 10 mg by mouth daily.    . Cholecalciferol (VITAMIN D PO) Take 1,000 Units by mouth 2 (two) times daily.    Marland Kitchen docusate sodium  (COLACE) 100 MG capsule Take 100 mg by mouth daily as needed for mild constipation.    . FULVESTRANT IM Inject into the muscle.    . Glucosamine 500 MG CAPS Take 500 mg by mouth daily.     Leslee Home 75 MG capsule TAKE 1 CAPSULE BY MOUTH THREE TIMES A WEEK. TAKE EVERY MONDAY, WEDNESDAY, AND FRIDAY FOR 3 WEEKS THEN TAKE 1 WEEK OFF 21 capsule 0  . meclizine (ANTIVERT) 25 MG tablet TAKE 1 TABLET(25 MG) BY MOUTH EVERY 6 HOURS AS NEEDED FOR DIZZINESS 60 tablet 0  . omeprazole (PRILOSEC) 20 MG capsule Take 1 capsule (20 mg total) by mouth daily. (Patient taking differently: Take 20 mg by mouth daily as needed (reflux). ) 90 capsule 4  . OVER THE COUNTER MEDICATION Beno for gas, takes twice daily    . peppermint oil liquid Take by mouth as needed. IBgard as directed 30 mL 12  . Probiotic Product (PROBIOTIC DAILY PO) Take 1 tablet by mouth daily.      No current facility-administered medications on file prior to visit.     BP 112/72 (BP Location: Left Arm, Patient Position: Sitting, Cuff Size: Normal)   Pulse 78   Temp 97.6 F (36.4 C) (Oral)   Ht 5\' 2"  (1.575 m)   Wt 119 lb 6.4 oz (54.2 kg)   SpO2 97%   BMI 21.84 kg/m     Review of Systems  Constitutional: Negative.   HENT: Negative for congestion, dental problem, hearing loss, rhinorrhea, sinus pressure, sore throat and tinnitus.   Eyes: Negative for pain, discharge and visual disturbance.  Respiratory: Negative for cough and shortness of breath.   Cardiovascular: Negative for chest pain, palpitations and leg swelling.  Gastrointestinal: Negative for abdominal distention, abdominal pain, blood in stool, constipation, diarrhea, nausea and vomiting.  Genitourinary: Negative for difficulty urinating, dysuria, flank pain, frequency, hematuria, pelvic pain, urgency, vaginal bleeding, vaginal discharge and vaginal pain.  Musculoskeletal: Negative for arthralgias, gait problem and joint swelling.  Skin: Negative for rash.  Neurological: Negative  for dizziness, syncope, speech difficulty, weakness, numbness and headaches.  Hematological: Negative for adenopathy.  Psychiatric/Behavioral: Negative for agitation, behavioral problems and dysphoric mood. The patient is nervous/anxious.        Objective:   Physical Exam  Constitutional: She is oriented to person, place, and time. She appears well-developed and well-nourished.  Blood pressure low normal  HENT:  Head: Normocephalic.  Right Ear: External ear normal.  Left Ear: External ear normal.  Mouth/Throat: Oropharynx is clear and moist.  Eyes: Conjunctivae and EOM are normal. Pupils are equal, round, and reactive to light.  Neck: Normal range of motion. Neck supple. No thyromegaly present.  Cardiovascular: Normal rate, regular rhythm, normal heart sounds and intact distal pulses.  Pulmonary/Chest: Effort normal and breath sounds normal.  Abdominal: Soft. Bowel sounds are normal. She exhibits no mass. There is no tenderness.  Musculoskeletal: Normal range of motion.  Lymphadenopathy:    She has no cervical adenopathy.  Neurological: She is alert and oriented to person, place, and time.  Skin: Skin is warm and dry. No rash noted.  Psychiatric: She has a normal mood and affect. Her behavior is normal.          Assessment & Plan:   Essential hypertension.  Well controlled.  No change in therapy Stage IV right breast cancer.  Close oncological follow-up  Follow-up here 6 months or as needed  [name]

## 2017-04-18 ENCOUNTER — Other Ambulatory Visit (HOSPITAL_BASED_OUTPATIENT_CLINIC_OR_DEPARTMENT_OTHER): Payer: Medicare Other

## 2017-04-18 ENCOUNTER — Ambulatory Visit (HOSPITAL_BASED_OUTPATIENT_CLINIC_OR_DEPARTMENT_OTHER): Payer: Medicare Other

## 2017-04-18 VITALS — BP 128/72 | HR 72 | Temp 98.2°F | Resp 20

## 2017-04-18 DIAGNOSIS — C50911 Malignant neoplasm of unspecified site of right female breast: Secondary | ICD-10-CM | POA: Diagnosis not present

## 2017-04-18 DIAGNOSIS — C7951 Secondary malignant neoplasm of bone: Secondary | ICD-10-CM | POA: Diagnosis not present

## 2017-04-18 DIAGNOSIS — C50411 Malignant neoplasm of upper-outer quadrant of right female breast: Secondary | ICD-10-CM

## 2017-04-18 DIAGNOSIS — Z5111 Encounter for antineoplastic chemotherapy: Secondary | ICD-10-CM

## 2017-04-18 DIAGNOSIS — Z853 Personal history of malignant neoplasm of breast: Secondary | ICD-10-CM

## 2017-04-18 LAB — CBC WITH DIFFERENTIAL/PLATELET
BASO%: 1.9 % (ref 0.0–2.0)
Basophils Absolute: 0.1 10*3/uL (ref 0.0–0.1)
EOS ABS: 0.2 10*3/uL (ref 0.0–0.5)
EOS%: 5.2 % (ref 0.0–7.0)
HEMATOCRIT: 38.1 % (ref 34.8–46.6)
HEMOGLOBIN: 13 g/dL (ref 11.6–15.9)
LYMPH#: 1.2 10*3/uL (ref 0.9–3.3)
LYMPH%: 35.5 % (ref 14.0–49.7)
MCH: 36 pg — ABNORMAL HIGH (ref 25.1–34.0)
MCHC: 34.1 g/dL (ref 31.5–36.0)
MCV: 105.7 fL — AB (ref 79.5–101.0)
MONO#: 0.2 10*3/uL (ref 0.1–0.9)
MONO%: 5.9 % (ref 0.0–14.0)
NEUT%: 51.5 % (ref 38.4–76.8)
NEUTROS ABS: 1.7 10*3/uL (ref 1.5–6.5)
PLATELETS: 159 10*3/uL (ref 145–400)
RBC: 3.6 10*6/uL — ABNORMAL LOW (ref 3.70–5.45)
RDW: 13.2 % (ref 11.2–14.5)
WBC: 3.3 10*3/uL — AB (ref 3.9–10.3)

## 2017-04-18 LAB — COMPREHENSIVE METABOLIC PANEL
ALT: 16 U/L (ref 0–55)
AST: 17 U/L (ref 5–34)
Albumin: 3.8 g/dL (ref 3.5–5.0)
Alkaline Phosphatase: 90 U/L (ref 40–150)
Anion Gap: 7 mEq/L (ref 3–11)
BUN: 16.6 mg/dL (ref 7.0–26.0)
CHLORIDE: 102 meq/L (ref 98–109)
CO2: 29 mEq/L (ref 22–29)
CREATININE: 0.8 mg/dL (ref 0.6–1.1)
Calcium: 9.2 mg/dL (ref 8.4–10.4)
EGFR: >60 mL/min/{1.73_m2} (ref >60)
GLUCOSE: 95 mg/dL (ref 70–140)
POTASSIUM: 4.4 meq/L (ref 3.5–5.1)
SODIUM: 138 meq/L (ref 136–145)
Total Bilirubin: 0.63 mg/dL (ref 0.20–1.20)
Total Protein: 7 g/dL (ref 6.4–8.3)

## 2017-04-18 MED ORDER — FULVESTRANT 250 MG/5ML IM SOLN
500.0000 mg | Freq: Once | INTRAMUSCULAR | Status: AC
  Administered 2017-04-18: 500 mg via INTRAMUSCULAR

## 2017-04-18 NOTE — Patient Instructions (Signed)

## 2017-04-19 LAB — CANCER ANTIGEN 27.29: CA 27.29: 44.4 U/mL — ABNORMAL HIGH (ref 0.0–38.6)

## 2017-04-24 ENCOUNTER — Ambulatory Visit (HOSPITAL_COMMUNITY)
Admission: RE | Admit: 2017-04-24 | Discharge: 2017-04-24 | Disposition: A | Discharge status: Home / Self Care | Payer: Medicare Other | Source: Ambulatory Visit | Attending: Oncology | Admitting: Oncology

## 2017-04-24 ENCOUNTER — Encounter (HOSPITAL_COMMUNITY)
Admission: RE | Admit: 2017-04-24 | Discharge: 2017-04-24 | Disposition: A | Discharge status: Home / Self Care | Payer: Medicare Other | Source: Ambulatory Visit | Attending: Oncology | Admitting: Oncology

## 2017-04-24 ENCOUNTER — Encounter (HOSPITAL_COMMUNITY): Payer: Self-pay

## 2017-04-24 DIAGNOSIS — C50911 Malignant neoplasm of unspecified site of right female breast: Secondary | ICD-10-CM

## 2017-04-24 DIAGNOSIS — C50411 Malignant neoplasm of upper-outer quadrant of right female breast: Secondary | ICD-10-CM | POA: Insufficient documentation

## 2017-04-24 DIAGNOSIS — R918 Other nonspecific abnormal finding of lung field: Secondary | ICD-10-CM | POA: Diagnosis not present

## 2017-04-24 DIAGNOSIS — C7951 Secondary malignant neoplasm of bone: Secondary | ICD-10-CM

## 2017-04-24 DIAGNOSIS — C50919 Malignant neoplasm of unspecified site of unspecified female breast: Secondary | ICD-10-CM | POA: Diagnosis not present

## 2017-04-24 DIAGNOSIS — R911 Solitary pulmonary nodule: Secondary | ICD-10-CM | POA: Diagnosis not present

## 2017-04-24 DIAGNOSIS — I2510 Atherosclerotic heart disease of native coronary artery without angina pectoris: Secondary | ICD-10-CM | POA: Insufficient documentation

## 2017-04-24 DIAGNOSIS — I700 Atherosclerosis of aorta: Secondary | ICD-10-CM | POA: Diagnosis not present

## 2017-04-24 DIAGNOSIS — Z170 Estrogen receptor positive status [ER+]: Secondary | ICD-10-CM | POA: Diagnosis not present

## 2017-04-24 DIAGNOSIS — R590 Localized enlarged lymph nodes: Secondary | ICD-10-CM | POA: Diagnosis not present

## 2017-04-24 MED ORDER — IOPAMIDOL (ISOVUE-300) INJECTION 61%
INTRAVENOUS | Status: AC
  Filled 2017-04-24: qty 75

## 2017-04-24 MED ORDER — TECHNETIUM TC 99M MEDRONATE IV KIT
25.0000 | PACK | Freq: Once | INTRAVENOUS | Status: AC | PRN
  Administered 2017-04-24: 21 via INTRAVENOUS

## 2017-04-24 MED ORDER — IOPAMIDOL (ISOVUE-300) INJECTION 61%
75.0000 mL | Freq: Once | INTRAVENOUS | Status: AC | PRN
  Administered 2017-04-24: 75 mL via INTRAVENOUS

## 2017-04-30 ENCOUNTER — Other Ambulatory Visit: Payer: Self-pay | Admitting: Internal Medicine

## 2017-05-07 DIAGNOSIS — H25811 Combined forms of age-related cataract, right eye: Secondary | ICD-10-CM | POA: Diagnosis not present

## 2017-05-07 DIAGNOSIS — H2511 Age-related nuclear cataract, right eye: Secondary | ICD-10-CM | POA: Diagnosis not present

## 2017-05-14 ENCOUNTER — Telehealth: Payer: Self-pay | Admitting: Pharmacy Technician

## 2017-05-14 NOTE — Telephone Encounter (Signed)
[name] Encounter  Patient has been conditionally approved for copay assistance with [employer] [place] (TAF).    Current enrollment expires on 06/09/2017.  The patient will receive an enrollment application in the mail to complete and return to TAF.  This application's submission is required for the patient's continued enrollment after 06/09/2017.  I have discussed this with the patient.   When fully enrolled, The Assistance Fund will cover all copayment expenses for Ibrance for the remainder of the calendar year.    The billing information is as follows and has been shared with [place].  Member ID: [id number] Group ID: [id number] PCN: [id number] BIN: [id number]  I will continue to follow up for full enrollment status.   Fabio Asa. [name], [place] Patient [place] [phone] 05/14/2017 2:35 PM

## 2017-05-14 NOTE — Telephone Encounter (Signed)
Received communication from [employer] that since copay assistance grants are available, enrollment will not be granted at this time.    Patient has been conditionally enrolled with [employer] for copay assistance.  Her medications will now be processed at [facility].    This application has been suspended with [place] and filed away in the event that it is needed later in the year.

## 2017-05-16 ENCOUNTER — Telehealth: Payer: Self-pay | Admitting: Pharmacist

## 2017-05-16 ENCOUNTER — Inpatient Hospital Stay: Payer: Medicare Other

## 2017-05-16 ENCOUNTER — Inpatient Hospital Stay: Payer: Medicare Other | Attending: Oncology

## 2017-05-16 DIAGNOSIS — Z170 Estrogen receptor positive status [ER+]: Secondary | ICD-10-CM | POA: Insufficient documentation

## 2017-05-16 DIAGNOSIS — C7951 Secondary malignant neoplasm of bone: Secondary | ICD-10-CM

## 2017-05-16 DIAGNOSIS — Z853 Personal history of malignant neoplasm of breast: Secondary | ICD-10-CM

## 2017-05-16 DIAGNOSIS — C50411 Malignant neoplasm of upper-outer quadrant of right female breast: Secondary | ICD-10-CM

## 2017-05-16 DIAGNOSIS — C50911 Malignant neoplasm of unspecified site of right female breast: Secondary | ICD-10-CM

## 2017-05-16 DIAGNOSIS — Z5111 Encounter for antineoplastic chemotherapy: Secondary | ICD-10-CM | POA: Insufficient documentation

## 2017-05-16 LAB — CBC WITH DIFFERENTIAL/PLATELET
Basophils Absolute: 0.1 10*3/uL (ref 0.0–0.1)
Basophils Relative: 1 %
EOS PCT: 5 %
Eosinophils Absolute: 0.2 10*3/uL (ref 0.0–0.5)
HEMATOCRIT: 38.3 % (ref 34.8–46.6)
Hemoglobin: 12.8 g/dL (ref 11.6–15.9)
LYMPHS PCT: 34 %
Lymphs Abs: 1.3 10*3/uL (ref 0.9–3.3)
MCH: 35.4 pg — ABNORMAL HIGH (ref 25.1–34.0)
MCHC: 33.4 g/dL (ref 31.5–36.0)
MCV: 105.8 fL — AB (ref 79.5–101.0)
MONO ABS: 0.2 10*3/uL (ref 0.1–0.9)
MONOS PCT: 5 %
NEUTROS ABS: 2.1 10*3/uL (ref 1.5–6.5)
Neutrophils Relative %: 55 %
Platelets: 156 10*3/uL (ref 145–400)
RBC: 3.62 MIL/uL — ABNORMAL LOW (ref 3.70–5.45)
RDW: 12.7 % (ref 11.2–16.1)
WBC: 3.9 10*3/uL (ref 3.9–10.3)

## 2017-05-16 LAB — COMPREHENSIVE METABOLIC PANEL
ALT: 15 U/L (ref 0–55)
AST: 16 U/L (ref 5–34)
Albumin: 3.7 g/dL (ref 3.5–5.0)
Alkaline Phosphatase: 87 U/L (ref 40–150)
Anion gap: 10 (ref 3–11)
BILIRUBIN TOTAL: 0.4 mg/dL (ref 0.2–1.2)
BUN: 16 mg/dL (ref 7–26)
CO2: 28 mmol/L (ref 22–29)
Calcium: 9.3 mg/dL (ref 8.4–10.4)
Chloride: 101 mmol/L (ref 98–109)
Creatinine, Ser: 0.81 mg/dL (ref 0.60–1.10)
Glucose, Bld: 106 mg/dL (ref 70–140)
POTASSIUM: 4.5 mmol/L (ref 3.3–4.7)
Sodium: 139 mmol/L (ref 136–145)
TOTAL PROTEIN: 6.9 g/dL (ref 6.4–8.3)

## 2017-05-16 MED ORDER — PALBOCICLIB 75 MG PO CAPS: ORAL_CAPSULE | ORAL | 6 refills | Status: DC

## 2017-05-16 MED ORDER — FULVESTRANT 250 MG/5ML IM SOLN
500.0000 mg | Freq: Once | INTRAMUSCULAR | Status: AC
  Administered 2017-05-16: 500 mg via INTRAMUSCULAR

## 2017-05-16 MED ORDER — FULVESTRANT 250 MG/5ML IM SOLN
INTRAMUSCULAR | Status: AC
  Filled 2017-05-16: qty 5

## 2017-05-16 NOTE — Patient Instructions (Signed)

## 2017-05-16 NOTE — Telephone Encounter (Signed)
Oral Chemotherapy Pharmacist Encounter  Follow-Up Form  Spoke with patient today to follow up regarding patient's oral chemotherapy medication: Ibrance (palbociclib) for the treatment of hormone receptor positive, metastatic breast cancer, in conjunction with Faslodex, planned duration until disease progression or unacceptable toxicity  Original Start date of oral chemotherapy: 06/14/16  Pt is doing well today  Pt reports 0 doses of Ibrance 75mg  capsules, 1 capsule by mouth on Mondays, Wednesdays, and Fridays, with breakfast, taken for 3 weeks on, 1 week off, repeated every 4 weeks, missed in the last month.   Pt reports the following side effects: frequent diarrhea, controlled with anti-diarrheals  Pertinent labs reviewed: OK for treatment.  Other Issues: Patient now with copayment assistance grant and will receive her Leslee Home from the [facility]. New prescription has been sent.  Patient knows to call the office with questions or concerns. Oral Oncology Clinic will continue to follow.  Thank you,  Johny Drilling, PharmD, BCPS, BCOP 05/16/2017 2:34 PM Oral Oncology Clinic [phone]

## 2017-05-16 NOTE — Telephone Encounter (Signed)
Signed enrollment application for [employer] [place] (TAF) uploaded into the TAF portal.  I will continue to follow up for full enrollment status.   Fabio Asa. [name], [place] Patient [place] [phone] 05/16/2017 10:48 AM

## 2017-05-17 ENCOUNTER — Ambulatory Visit (AMBULATORY_SURGERY_CENTER): Payer: Self-pay | Admitting: *Deleted

## 2017-05-17 ENCOUNTER — Other Ambulatory Visit: Payer: Self-pay

## 2017-05-17 VITALS — Ht 62.0 in | Wt 121.0 lb

## 2017-05-17 DIAGNOSIS — K588 Other irritable bowel syndrome: Secondary | ICD-10-CM

## 2017-05-17 LAB — CANCER ANTIGEN 27.29: CA 27.29: 44.4 U/mL — ABNORMAL HIGH (ref 0.0–38.6)

## 2017-05-17 NOTE — Progress Notes (Signed)
No egg or soy allergy known to patient  No issues with past sedation with any surgeries  or procedures, no intubation problems  No diet pills per patient No home 02 use per patient  No blood thinners per patient  Pt denies issues with constipation  No A fib or A flutter  EMMI video sent to pt's e mail pt declined   

## 2017-05-20 ENCOUNTER — Ambulatory Visit: Payer: Medicare Other | Admitting: Gastroenterology

## 2017-05-22 ENCOUNTER — Telehealth: Payer: Self-pay | Admitting: Gastroenterology

## 2017-05-22 NOTE — Telephone Encounter (Signed)
Patient just had eye surgery two weeks ago and is still feeling weak from that, she is also still taking chemotherapy. She would like to cancel her colonoscopy, currently the medication is helping and is not bothered by constipation. She will call back to reschedule once she has gained some strength back.

## 2017-05-22 NOTE — Telephone Encounter (Signed)
Okay thanks for letting me know

## 2017-05-22 NOTE — Telephone Encounter (Signed)
Patient states she needs to speak with nurse about her upcoming procedure on 1.29.19. Pt states she does not think she is in good health and can go through with procedure. I suggested to the patient that I could cancel or resch for her, but she said she needed to speak more closely to the doctor.

## 2017-05-23 ENCOUNTER — Telehealth: Payer: Self-pay | Admitting: Internal Medicine

## 2017-05-23 MED FILL — IBRANCE 75 MG CAPSULE: 75 | 28 days supply | Qty: 9 | Fill #0

## 2017-05-23 NOTE — Telephone Encounter (Signed)
[name] Encounter  Confirmed with [employer] that the patient has been fully enrolled in their copay assistance program until 04/29/2018.  The [place] will cover all of the patient's copay expenses for Ibrance until this date.    Erica Young. [name], [place] Patient [place] [phone] 05/23/2017 1:57 PM

## 2017-05-23 NOTE — Telephone Encounter (Signed)
Okay for referral?

## 2017-05-23 NOTE — Telephone Encounter (Signed)
Copied from [place]. Topic: General - Other >> May 23, 2017  9:33 AM Lolita Rieger, RMA wrote: Reason for CRM: pt would like a referral placed for an allergist placed she stated that she has spoken to the doctor about this previously  Please contact pt

## 2017-05-23 NOTE — Telephone Encounter (Signed)
[place] for referral? Please advise  And which Dx?

## 2017-05-24 ENCOUNTER — Other Ambulatory Visit: Payer: Self-pay | Admitting: Internal Medicine

## 2017-05-24 DIAGNOSIS — T7840XD Allergy, unspecified, subsequent encounter: Secondary | ICD-10-CM

## 2017-05-24 NOTE — Telephone Encounter (Signed)
Referral order place, pt called and make aware.

## 2017-05-28 ENCOUNTER — Encounter: Payer: Medicare Other | Admitting: Gastroenterology

## 2017-06-10 NOTE — Progress Notes (Signed)
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 She continues on faslodex, with good tolerance. She notes that the nurse does a great job, and it isn't painful for her.  She also receives palbociclib, currently on the 75 mg dose, which she takes 3 times per week for 3 weeks and one week off. She takes this with good tolerance. She notes that she hasn't noticed any side affects, other than her hair thinning.  Since her last visit, she completed a restaging chest CT on 04/24/2017 showing: Slight growth of a small pulmonary nodule in the periphery of the left upper lobe. Other small pulmonary nodules are stable compared to the prior examination.  Mediastinal, bilateral hilar and right internal mammary lymphadenopathy very similar to the prior examination. Slight increasing sclerosis in T8 vertebral body, concerning for worsening metastatic disease. Previously noted metastatic lesion in the posterior aspect of the left fifth rib is unchanged. Aortic atherosclerosis, in addition to left anterior descending coronary artery disease. Persistent mass-like area in the subareolar aspect of the left breast, nonspecific by CT, but stable compared to 2017. Aortic  Atherosclerosis (ICD10-I70.0).  She also completed a bone scan on 04/24/2017 showing: Stable osseous metastatic disease as above. No new scintigraphic abnormalities since 07/09/2016  REVIEW OF SYSTEMS: Erica Young reports that she is celebrating Valentine's Day with her husband. She notes that she hasn't been well lately. She notes that she is losing her hair. She has peripheral neuropathy in her hands and toes. She notes that she experiences itching on her arms and scalp. She notes that she is going to see Dr. Ulla Potash, an allergist at the [facility], because her allergies are giving her trouble. She notes that she has SOB and dyspnea. She notes that she has abdominal pain and gas, and she takes peppermint oil to aid this. She notes that she was supposed to have a colonoscopy, but she wasn't feel well had had to reschedule her appointment. She denies unusual headaches, visual changes, nausea, vomiting, or dizziness. There has been no unusual cough, phlegm production, or pleurisy. This been no change in bowel or bladder habits. She denies unexplained fatigue or unexplained weight loss, bleeding, rash, or fever. A detailed review of systems was otherwise stable.    BREAST CANCER HISTORY: From the earlier summary note  Erica Young tells me in 2007 while living in [place] she was found to have a very small cancer in the upper-outer quadrant of the right breast, about the size of the P, noted on mammography. It was not palpable. She had a right lumpectomy and full sentinel lymph node sampling. She then received adjuvant radiation, to a total of 33 treatments. She received no systemic therapy.  She then did well until December 2014, when she had a fall and complain of pain in her left ribs. Rib films and chest x-ray on 04/13/2013  showed no fractures, but CT of the abdomen and pelvis on the same day found subcarinal and right hilar adenopathy. This was followed up with a chest CT scan 05/05/2013 confirming  extensive mediastinal and right hilar lymphadenopathy, with multiple pulmonary nodules, the largest measuring 0.9 cm. PET scan 05/21/2013 showed hypermetabolic adenopathy in the right and left paratracheal areas, the precarinal, subcarinal and right hilar areas, but no involvement of the liver, and the lung nodules were not hypermetabolic (although they were below the level of reliable detection). In addition, at L5 there was a lucency measuring 1.2 cm.  The PET scan also showed a hypermetabolic focus on the thyroid gland, which was evaluated with neck ultrasound and biopsy 07/12/9456, showing a follicular lesion of undetermined significance (([id number]).  The patient was referred to pulmonary [Dr Melvyn Novas and Dr Julien Nordmann for further evaluation. Repeat PET scan 12/28/2013 showed, in addition to the adenopathy previously noted, now multiple bony metastases. On 01/07/2014 the patient underwent bronchoscopy and this showed ([id number], together with separate cytology] a low-grade mucinous invasive breast cancer, estrogen receptor 100% positive, progesterone receptor 14% positive, with no HER-2 amplification, the signals ratio being 1.30 and the number per cell 1.95.  The patient was started on anastrozole 01/22/2014; monthly denosumab/Xgeva was added 07/30/2014. She appeared to tolerate this well, and her CA-27-29 (127 at baseline) normalized. Most recent CT scans of chest abdomen and pelvis 10/03/2015 showed continuing response. Despite this good news however, the patient decided to go off anastrozole and denosumab/Xgeva as of June 2017. By the time she saw Dr. Earlie Server again in September 2017 her tumor marker had doubled. At that time the patient was referred to the breast clinic for a second opinion regarding further evaluation and treatment.   PAST MEDICAL HISTORY: Past Medical History:  Diagnosis Date  . Allergy   . Anxiety   . Bone cancer ([place]) mri 11/11/14   right parietal bone,left cribriform plate  metastases  . Cancer [facility]) 2007   right breat ca  , lumpectomy and radiation tx (declined chemo and additional prophylactic meds)  . Cataract    both eyes  10/2016 Lt.     04/2017 Rt.   . CEREBROVASCULAR DISEASE 03/03/2010  . Diverticulitis   . DIVERTICULOSIS, COLON 03/03/2010  . Fatty liver 08/02/09   as per U/S done by [facility]  . Foramen ovale    positive bubble study  . GERD (gastroesophageal reflux disease)   . Headache(784.0)    she thinks sinus headaches  . History of cerebral artery stenosis    right middle  . HYPERLIPIDEMIA 03/03/2010  . Hypertension   . HYPOTHYROIDISM 03/03/2010   no longer on meds  . Internal hemorrhoid   . Low back pain   . Mastoiditis    noted on brain MRI  . Rib fracture   . Right leg pain   . Shortness of breath   . Stroke [facility]) Oct. 2011   TIA  . Ulcer     PAST SURGICAL HISTORY: Past Surgical History:  Procedure Laterality Date  . BREAST LUMPECTOMY     right  . CATARACT EXTRACTION Left    10/2016   Rt 04/2017  . CHOLECYSTECTOMY    . COLONOSCOPY    . POLYPECTOMY     benign cecum  . SHOULDER SURGERY     right shoulder (dislocation)  . TONSILLECTOMY    . TUBAL LIGATION    . VIDEO BRONCHOSCOPY WITH ENDOBRONCHIAL ULTRASOUND N/A 01/07/2014   Procedure: VIDEO  BRONCHOSCOPY WITH ENDOBRONCHIAL ULTRASOUND;  Surgeon: Ivin Poot, MD;  Location: [facility] OR;  Service: Thoracic;  Laterality: N/A;    FAMILY HISTORY Family History  Problem Relation Age of Onset  . Heart disease Sister        cerebral vascular disease also  . Heart disease Brother        cerebral vascular disease also  . Heart disease Brother        cerebral vascular disease  . Heart disease Sister        cebreal vascular disease also  . Heart disease Sister        cebreal vascular diease also  . Heart disease Sister        cerebral vascular diease also  . Stomach cancer Father   . Colon cancer Neg Hx   . Esophageal cancer Neg Hx   . Rectal cancer Neg Hx   .  Colon polyps Neg Hx   The patient's father died from stomach cancer in his late 35s. The patient's mother died in her 45s from a stroke. The patient has 2 brothers, 4 sisters. One sister had leukemia. There is no history of breast, colon, or ovarian cancer in the family to her knowledge  GYNECOLOGIC HISTORY:  No LMP recorded. Patient is postmenopausal. Menarche age 86, first live birth age 54, the patient is [place] P2. She went through menopause in her late 55s. She took no hormone replacement. She took oral contraceptives for approximately 2 years remotely without complications.  SOCIAL HISTORY:  K[place]ia is originally from [place], [place]. She worked as a Education officer, museum, particularly, in the field of substance abuse. Her husband, Erica Young, is a retired substance abuse Social worker. This is a second marriage for both of them. Tamala has 2 children from her first marriage, Erica Young, lives in [place], and worked as a Audiological scientist but is now disabled, and Erica Young, who lives in [place] and works in Engineer, mining. The patient has 6 grandchildren. She attends [employer].--Mikki Santee has 3 children from his first marriage. Erica Young lives in [place] and has his own trucking business. He tells me he is estranged from his 2 daughters Erica Young (a retired Pharmacist, hospital) and Erica Young (who works in a bank). They are living on [place].   ADVANCED DIRECTIVES: in place   HEALTH MAINTENANCE: Social History   Tobacco Use  . Smoking status: Former Smoker    Packs/day: 0.50    Years: 20.00    Pack years: 10.00    Types: Cigarettes    Last attempt to quit: 05/01/1991    Years since quitting: 26.1  . Smokeless tobacco: Never Used  Substance Use Topics  . Alcohol use: No    Alcohol/week: 0.0 oz  . Drug use: No     Colonoscopy:  PAP:  Bone density:   Allergies  Allergen Reactions  . Ciprofloxacin Anaphylaxis    Current Outpatient Medications  Medication Sig Dispense Refill  .  ALPRAZolam (XANAX) 0.25 MG tablet TAKE 1 TABLET THREE TIMES DAILY AS NEEDED FOR ANXIETY 90 tablet 0  . aspirin EC 81 MG tablet Take 81 mg by mouth daily after breakfast.     . bisacodyl (DULCOLAX) 5 MG EC tablet Take 5 mg by mouth daily as needed for moderate constipation. Use as instructed per colonoscopy prep    . CARTIA XT 120 MG 24 hr capsule TAKE 1 CAPSULE BY MOUTH DAILY 90 capsule 0  . cetirizine (ZYRTEC) 10 MG tablet Take  10 mg by mouth daily.    . Cholecalciferol (VITAMIN D PO) Take 1,000 Units by mouth 2 (two) times daily.    Marland Kitchen docusate sodium (COLACE) 100 MG capsule Take 100 mg by mouth daily as needed for mild constipation.    . DUREZOL 0.05 % EMUL INSTILL 1 DROP INTO THE RIGHT EYE TID  1  . FULVESTRANT IM Inject into the muscle.    . Glucosamine 500 MG CAPS Take 500 mg by mouth daily.     . meclizine (ANTIVERT) 25 MG tablet TAKE 1 TABLET(25 MG) BY MOUTH EVERY 6 HOURS AS NEEDED FOR DIZZINESS 60 tablet 0  . NON FORMULARY 2 capsules every morning. IB Guard    . omeprazole (PRILOSEC) 20 MG capsule Take 1 capsule (20 mg total) by mouth daily. (Patient taking differently: Take 20 mg by mouth daily as needed (reflux). ) 90 capsule 4  . OVER THE COUNTER MEDICATION Beno for gas, takes twice daily    . palbociclib (IBRANCE) 75 MG capsule TAKE 1 CAPSULE BY MOUTH THREE TIMES A WEEK. TAKE EVERY MONDAY, WEDNESDAY, AND FRIDAY FOR 3 WEEKS THEN TAKE 1 WEEK OFF 9 capsule 6  . peppermint oil liquid Take by mouth as needed. IBgard as directed 30 mL 12  . polyethylene glycol powder (MIRALAX) powder Take 1 Container by mouth once. Use as directed per colonoscopy instructions    . Probiotic Product (PROBIOTIC DAILY PO) Take 1 tablet by mouth daily.     Marland Kitchen PROLENSA 0.07 % SOLN INSTILL 1 DROP INTO THE RIGHT EYE QHS  1  . RESTASIS 0.05 % ophthalmic emulsion INSTILL 1 DROP INTO BOTH EYES BID  0   No current facility-administered medications for this visit.     OBJECTIVE: Middle-aged Latin American woman who  appears stated age  77:   06/13/17 0953  BP: (!) 151/68  Pulse: 67  Resp: 18  Temp: 97.7 F (36.5 C)  SpO2: 99%     Body mass index is 22.68 kg/m.    ECOG FS:1 - Symptomatic but completely ambulatory  Sclerae unicteric, pupils round and equal Oropharynx clear and moist No cervical or supraclavicular adenopathy Lungs no rales or rhonchi Heart regular rate and rhythm Abd soft, nontender, positive bowel sounds MSK no focal spinal tenderness, no upper extremity lymphedema Neuro: nonfocal, well oriented, depressed and anxious affect Breasts: I do not palpate a mass in either breast.  Both axillae are benign  Breasts: The right breast is status post remote lumpectomy, with no evidence of local recurrence.  The left breast is benign.  Both axillae are benign.    LAB RESULTS:  CMP     Component Value Date/Time   NA 139 05/16/2017 0945   NA 138 04/18/2017 0853   K 4.5 05/16/2017 0945   K 4.4 04/18/2017 0853   CL 101 05/16/2017 0945   CO2 28 05/16/2017 0945   CO2 29 04/18/2017 0853   GLUCOSE 106 05/16/2017 0945   GLUCOSE 95 04/18/2017 0853   BUN 16 05/16/2017 0945   BUN 16.6 04/18/2017 0853   CREATININE 0.81 05/16/2017 0945   CREATININE 0.8 04/18/2017 0853   CALCIUM 9.3 05/16/2017 0945   CALCIUM 9.2 04/18/2017 0853   PROT 6.9 05/16/2017 0945   PROT 7.0 04/18/2017 0853   ALBUMIN 3.7 05/16/2017 0945   ALBUMIN 3.8 04/18/2017 0853   AST 16 05/16/2017 0945   AST 17 04/18/2017 0853   ALT 15 05/16/2017 0945   ALT 16 04/18/2017 0853   ALKPHOS 87  05/16/2017 0945   ALKPHOS 90 04/18/2017 0853   BILITOT 0.4 05/16/2017 0945   BILITOT 0.63 04/18/2017 0853   GFRNONAA >60 05/16/2017 0945   GFRAA >60 05/16/2017 0945    INo results found for: SPEP, UPEP  Lab Results  Component Value Date   WBC 3.7 (L) 06/13/2017   NEUTROABS 1.9 06/13/2017   HGB 12.6 06/13/2017   HCT 36.6 06/13/2017   MCV 104.6 (H) 06/13/2017   PLT 164 06/13/2017      Chemistry      Component Value  Date/Time   NA 139 05/16/2017 0945   NA 138 04/18/2017 0853   K 4.5 05/16/2017 0945   K 4.4 04/18/2017 0853   CL 101 05/16/2017 0945   CO2 28 05/16/2017 0945   CO2 29 04/18/2017 0853   BUN 16 05/16/2017 0945   BUN 16.6 04/18/2017 0853   CREATININE 0.81 05/16/2017 0945   CREATININE 0.8 04/18/2017 0853      Component Value Date/Time   CALCIUM 9.3 05/16/2017 0945   CALCIUM 9.2 04/18/2017 0853   ALKPHOS 87 05/16/2017 0945   ALKPHOS 90 04/18/2017 0853   AST 16 05/16/2017 0945   AST 17 04/18/2017 0853   ALT 15 05/16/2017 0945   ALT 16 04/18/2017 0853   BILITOT 0.4 05/16/2017 0945   BILITOT 0.63 04/18/2017 0853      CA-27-29 is down to 43  No components found for: LABCA125  No results for input(s): INR in the last 168 hours.  Urinalysis    Component Value Date/Time   COLORURINE STRAW (A) 11/19/2016 1750   APPEARANCEUR CLEAR 11/19/2016 1750   LABSPEC 1.003 (L) 11/19/2016 1750   PHURINE 7.0 11/19/2016 1750   GLUCOSEU NEGATIVE 11/19/2016 1750   HGBUR NEGATIVE 11/19/2016 1750   BILIRUBINUR NEGATIVE 11/19/2016 1750   BILIRUBINUR neg 02/16/2015 1318   KETONESUR NEGATIVE 11/19/2016 1750   PROTEINUR NEGATIVE 11/19/2016 1750   UROBILINOGEN 0.2 02/16/2015 1318   UROBILINOGEN 0.2 11/18/2013 1321   NITRITE NEGATIVE 11/19/2016 1750   LEUKOCYTESUR TRACE (A) 11/19/2016 1750     STUDIES: Since her last visit, she completed a restaging chest CT on 04/24/2017 showing: Slight growth of a small pulmonary nodule in the periphery of the left upper lobe. Other small pulmonary nodules are stable compared to the prior examination.  Mediastinal, bilateral hilar and right internal mammary lymphadenopathy very similar to the prior examination. Slight increasing sclerosis in T8 vertebral body, concerning for worsening metastatic disease. Previously noted metastatic lesion in the posterior aspect of the left fifth rib is unchanged. Aortic atherosclerosis, in addition to left anterior descending  coronary artery disease. Persistent mass-like area in the subareolar aspect of the left breast, nonspecific by CT, but stable compared to 2017. Aortic Atherosclerosis (ICD10-I70.0).  She also completed a bone scan on 04/24/2017 showing: Stable osseous metastatic disease as above. No new scintigraphic abnormalities since 07/09/2016  ELIGIBLE FOR AVAILABLE RESEARCH PROTOCOL: no  ASSESSMENT: 77 y.o. [place] woman  (1) status post right breast upper outer quadrant lumpectomy and axillary lymph node dissection in 2007 for what appears to have been a T1 N0, stage IA invasive ductal carcinoma, treated adjuvantly with radiation (33 sessions)  METASTATIC DISEASE definitively documented Sept 2015 (2) bronchoscopic biopsy 01/07/2014 showed a low-grade mucinous breast cancer, strongly estrogen receptor positive, progesterone receptor positive, HER-2 negative; staging studies confirmed extensive hypermetabolic adenopathy, multiple bone lesions, likely early lung involvement, but no liver lesions.  (a) bone scan and chest CT 07/09/2016 shows stable disease.  (b) CA-27-29  is moderately informative  (c) bone scan and CT scan of the chest 04/24/2017 shows essentially stable disease  (3) on Arimidex between September 2015 and June 2017, with evidence of response; discontinued secondary to side effects  (a) bone density 04/10/2016 shows osteopenia with a T score of -2.1  (4) on monthly denosumab/Xgeva between 07/30/2014 and 10/04/2015, discontinued due to patient's concerns regarding osteonecrosis of the jaw  (a) discussed again October 2017, the patient adamantly refusing denosumab  (5) started fulvestrant 01/26/2016  (a) Palbociclib added at 75 mg M/W/F, beginning mid February 2018  PLAN  Essie is now a little over 3 years out from definitive diagnosis of metastatic breast cancer.  She has very well controlled disease on minimal treatment, namely monthly fulvestrant and a very low dose  palbociclib.  Aside from the nuisance of having to come here monthly and the discomfort of receiving shots intramuscularly once a month, she is tolerating the treatments surprisingly well.  She has a variety of other symptoms which I do not believe are related to her treatment.  She is scheduled to see an allergist and I think that is a good move on her part.  Today she was very down on herself, feeling useless, unable to do anything, having "no future".  I encouraged her to volunteer at her church.  She is bilingual and that can be incredibly helpful to some of the parishioners at our [facility] who speak no English.  She is going to try simethicone for some of her gas problems.  If that does not work she wants to try activated charcoal.  She will continue fulvestrant monthly and we will check labs monthly at the same time.  She will see me again in 2 months.  She knows to call for any problems that may develop before the next visit.  Magrinat, Virgie Dad, MD  06/13/17 10:16 AM Medical Oncology and Hematology [facility] [address] [place], [place] [address] Tel. [phone]    Fax. [phone]  This document serves as a record of services personally performed by Lurline Del, MD. It was created on his behalf by Sheron Nightingale, a trained medical scribe. The creation of this record is based on the scribe's personal observations and the provider's statements to them.   I have reviewed the above documentation for accuracy and completeness, and I agree with the above.

## 2017-06-13 ENCOUNTER — Inpatient Hospital Stay: Payer: Medicare Other

## 2017-06-13 ENCOUNTER — Telehealth: Payer: Self-pay | Admitting: Oncology

## 2017-06-13 ENCOUNTER — Inpatient Hospital Stay (HOSPITAL_BASED_OUTPATIENT_CLINIC_OR_DEPARTMENT_OTHER): Payer: Medicare Other | Admitting: Oncology

## 2017-06-13 ENCOUNTER — Inpatient Hospital Stay: Payer: Medicare Other | Attending: Oncology

## 2017-06-13 VITALS — BP 151/68 | HR 67 | Temp 97.7°F | Resp 18 | Ht 62.0 in | Wt 124.0 lb

## 2017-06-13 VITALS — BP 142/86 | HR 64 | Temp 97.8°F | Resp 18

## 2017-06-13 DIAGNOSIS — Z5111 Encounter for antineoplastic chemotherapy: Secondary | ICD-10-CM | POA: Diagnosis not present

## 2017-06-13 DIAGNOSIS — C50911 Malignant neoplasm of unspecified site of right female breast: Secondary | ICD-10-CM

## 2017-06-13 DIAGNOSIS — C7951 Secondary malignant neoplasm of bone: Secondary | ICD-10-CM | POA: Insufficient documentation

## 2017-06-13 DIAGNOSIS — C50411 Malignant neoplasm of upper-outer quadrant of right female breast: Secondary | ICD-10-CM

## 2017-06-13 DIAGNOSIS — R109 Unspecified abdominal pain: Secondary | ICD-10-CM | POA: Diagnosis not present

## 2017-06-13 DIAGNOSIS — G620 Drug-induced polyneuropathy: Secondary | ICD-10-CM | POA: Insufficient documentation

## 2017-06-13 DIAGNOSIS — R143 Flatulence: Secondary | ICD-10-CM

## 2017-06-13 DIAGNOSIS — Z170 Estrogen receptor positive status [ER+]: Secondary | ICD-10-CM

## 2017-06-13 DIAGNOSIS — Z853 Personal history of malignant neoplasm of breast: Secondary | ICD-10-CM

## 2017-06-13 LAB — CBC WITH DIFFERENTIAL/PLATELET
BASOS PCT: 2 %
Basophils Absolute: 0.1 10*3/uL (ref 0.0–0.1)
EOS ABS: 0.2 10*3/uL (ref 0.0–0.5)
Eosinophils Relative: 4 %
HEMATOCRIT: 36.6 % (ref 34.8–46.6)
Hemoglobin: 12.6 g/dL (ref 11.6–15.9)
Lymphocytes Relative: 35 %
Lymphs Abs: 1.3 10*3/uL (ref 0.9–3.3)
MCH: 35.8 pg — AB (ref 25.1–34.0)
MCHC: 34.3 g/dL (ref 31.5–36.0)
MCV: 104.6 fL — ABNORMAL HIGH (ref 79.5–101.0)
MONOS PCT: 6 %
Monocytes Absolute: 0.2 10*3/uL (ref 0.1–0.9)
NEUTROS ABS: 1.9 10*3/uL (ref 1.5–6.5)
Neutrophils Relative %: 53 %
Platelets: 164 10*3/uL (ref 145–400)
RBC: 3.5 MIL/uL — ABNORMAL LOW (ref 3.70–5.45)
RDW: 13.2 % (ref 11.2–14.5)
WBC: 3.7 10*3/uL — ABNORMAL LOW (ref 3.9–10.3)

## 2017-06-13 LAB — COMPREHENSIVE METABOLIC PANEL
ALBUMIN: 3.6 g/dL (ref 3.5–5.0)
ALT: 16 U/L (ref 0–55)
AST: 16 U/L (ref 5–34)
Alkaline Phosphatase: 86 U/L (ref 40–150)
Anion gap: 8 (ref 3–11)
BILIRUBIN TOTAL: 0.4 mg/dL (ref 0.2–1.2)
BUN: 14 mg/dL (ref 7–26)
CALCIUM: 9.1 mg/dL (ref 8.4–10.4)
CO2: 25 mmol/L (ref 22–29)
Chloride: 104 mmol/L (ref 98–109)
Creatinine, Ser: 0.79 mg/dL (ref 0.60–1.10)
GFR calc non Af Amer: >60 mL/min (ref >60)
GLUCOSE: 104 mg/dL (ref 70–140)
POTASSIUM: 4 mmol/L (ref 3.5–5.1)
Sodium: 137 mmol/L (ref 136–145)
Total Protein: 6.7 g/dL (ref 6.4–8.3)

## 2017-06-13 MED ORDER — FULVESTRANT 250 MG/5ML IM SOLN
INTRAMUSCULAR | Status: AC
  Filled 2017-06-13: qty 5

## 2017-06-13 MED ORDER — FULVESTRANT 250 MG/5ML IM SOLN
500.0000 mg | Freq: Once | INTRAMUSCULAR | Status: AC
  Administered 2017-06-13: 500 mg via INTRAMUSCULAR

## 2017-06-13 NOTE — Telephone Encounter (Signed)
Gave avs and calendar for march - august

## 2017-06-14 LAB — CANCER ANTIGEN 27.29: CAN 27.29: 41.7 U/mL — AB (ref 0.0–38.6)

## 2017-06-18 ENCOUNTER — Encounter: Payer: Self-pay | Admitting: Allergy and Immunology

## 2017-06-18 ENCOUNTER — Ambulatory Visit (INDEPENDENT_AMBULATORY_CARE_PROVIDER_SITE_OTHER): Payer: Medicare Other | Admitting: Allergy and Immunology

## 2017-06-18 VITALS — BP 112/60 | HR 70 | Temp 97.5°F | Resp 16 | Ht 60.5 in | Wt 122.8 lb

## 2017-06-18 DIAGNOSIS — J309 Allergic rhinitis, unspecified: Secondary | ICD-10-CM

## 2017-06-18 DIAGNOSIS — Z91018 Allergy to other foods: Secondary | ICD-10-CM | POA: Diagnosis not present

## 2017-06-18 MED ORDER — FLUTICASONE PROPIONATE 50 MCG/ACT NA SUSP: 1.0000 | Freq: Two times a day (BID) | NASAL | 5 refills | Status: DC

## 2017-06-18 MED ORDER — AZELASTINE HCL 0.1 % NA SOLN: 1.0000 | Freq: Two times a day (BID) | NASAL | 5 refills | Status: DC

## 2017-06-18 NOTE — Assessment & Plan Note (Addendum)
   Aeroallergen avoidance measures have been discussed and provided in written form.  A prescription has been provided for fluticasone nasal spray, one spray per nostril 1-2 times daily as needed. Proper nasal spray technique has been discussed and demonstrated.  A prescription has been provided for azelastine nasal spray, one spray per nostril 1-2 times daily as needed.   Nasal saline lavage (NeilMed) has been recommended as needed and prior to medicated nasal sprays along with instructions for proper administration.  For thick post nasal drainage, add guaifenesin 600 mg (Mucinex)  twice daily as needed with adequate hydration as discussed.

## 2017-06-18 NOTE — Progress Notes (Signed)
New Patient Note  RE: Erica Young MRN: [id number] DOB: 14-Sep-1940 Date of Office Visit: 06/18/2017  Referring provider: Marletta Lor, MD Primary care provider: Marletta Lor, MD  Chief Complaint: Allergic Rhinitis  and Possible Food Intolerance   History of present illness: Erica Young is a 77 y.o. female seen today in consultation requested by Bluford Kaufmann, MD.  She is accompanied today by her husband who assists with the history.  She reports that over the past few years she has experienced progressive nasal congestion, rhinorrhea, nasal pruritus, postnasal drainage, irritated throat, hoarseness, sneezing, coughing, ocular pruritus, and sinus pressure over the forehead.  No significant seasonal symptom variation has been noted nor have specific environmental triggers been identified.  She and her husband have implemented careful allergen reducing measures to their home and she has tried over-the-counter loratadine without adequate symptom relief.  She also notes that over the past 6 months she has had diminished sense of smell.  She also complains of frequent gastrointestinal symptoms.  She is uncertain if these GI symptoms are triggered by a specific food.   Assessment and plan: Perennial allergic rhinitis with a nonallergic component  Aeroallergen avoidance measures have been discussed and provided in written form.  A prescription has been provided for fluticasone nasal spray, one spray per nostril 1-2 times daily as needed. Proper nasal spray technique has been discussed and demonstrated.  A prescription has been provided for azelastine nasal spray, one spray per nostril 1-2 times daily as needed.   Nasal saline lavage (NeilMed) has been recommended as needed and prior to medicated nasal sprays along with instructions for proper administration.  For thick post nasal drainage, add guaifenesin 600 mg (Mucinex)  twice daily as needed with adequate hydration as  discussed.  GI symptoms/possible food intolerance Gastrointestinal symptoms, uncertain etiology. Skin tests to select food allergens were negative today. The negative predictive value of food allergen skin testing is excellent (approximately 95%). While this does not appear to be an IgE mediated issue, skin testing does not rule out food intolerances or cell-mediated enteropathies which may lend to GI symptoms. These etiologies are suggested when elimination of the responsible food leads to symptom resolution and re-introduction of the food is followed by the return of symptoms.   The patient has been encouraged to keep a careful symptom/food journal and eliminate any food suspected of correlating with symptoms. Should symptoms concerning for anaphylaxis arise, 911 is to be called immediately.  If GI symptoms persist or progress, gastroenterologist evaluation may be warranted.   Meds ordered this encounter  Medications  . azelastine (ASTELIN) 0.1 % nasal spray    Sig: Place 1 spray into both nostrils 2 (two) times daily. Use in each nostril as directed    Dispense:  30 mL    Refill:  5  . fluticasone (FLONASE) 50 MCG/ACT nasal spray    Sig: Place 1 spray into both nostrils 2 (two) times daily.    Dispense:  18.2 g    Refill:  5    Diagnostics: Epicutaneous testing: Negative despite a positive histamine control. Intradermal testing: Borderline positive to mold and dust mite antigen. Food allergen skin testing: Negative despite a positive histamine control.    Physical examination: Blood pressure 112/60, pulse 70, temperature (!) 97.5 F (36.4 C), temperature source Oral, resp. rate 16, height 5' 0.5" (1.537 m), weight 122 lb 12.8 oz (55.7 kg), SpO2 97 %.  General: Alert, interactive, in no acute distress. HEENT: TMs pearly  gray, turbinates mildly edematous with clear discharge, post-pharynx moderately erythematous. Neck: Supple without lymphadenopathy. Lungs: Clear to auscultation  without wheezing, rhonchi or rales. CV: Normal S1, S2 without murmurs. Abdomen: Nondistended, nontender. Skin: Warm and dry, without lesions or rashes. Extremities:  No clubbing, cyanosis or edema. Neuro:   Grossly intact.  Review of systems:  Review of systems negative except as noted in HPI / PMHx or noted below: Review of Systems  Constitutional: Negative.   HENT: Negative.   Eyes: Negative.   Respiratory: Negative.   Cardiovascular: Negative.   Gastrointestinal: Negative.   Genitourinary: Negative.   Musculoskeletal: Negative.   Skin: Negative.   Neurological: Negative.   Endo/Heme/Allergies: Negative.   Psychiatric/Behavioral: Negative.     Past medical history:  Past Medical History:  Diagnosis Date  . Allergy   . Anxiety   . 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 Cancer [facility]) 2007   right breat ca  , lumpectomy and radiation tx (declined chemo and additional prophylactic meds)  . Cataract    both eyes  10/2016 Lt.     04/2017 Rt.   . CEREBROVASCULAR DISEASE 03/03/2010  . Diverticulitis   . DIVERTICULOSIS, COLON 03/03/2010  . Fatty liver 08/02/09   as per U/S done by [facility]  . Foramen ovale    positive bubble study  . GERD (gastroesophageal reflux disease)   . Headache(784.0)    she thinks sinus headaches  . History of cerebral artery stenosis    right middle  . HYPERLIPIDEMIA 03/03/2010  . Hypertension   . HYPOTHYROIDISM 03/03/2010   no longer on meds  . Internal hemorrhoid   . Low back pain   . Mastoiditis    noted on brain MRI  . Rib fracture   . Right leg pain   . Shortness of breath   . Stroke [facility]) Oct. 2011   TIA  . Ulcer     Past surgical history:  Past Surgical History:  Procedure Laterality Date  . BREAST LUMPECTOMY     right  . CATARACT EXTRACTION Left    10/2016   Rt 04/2017  . CHOLECYSTECTOMY    . COLONOSCOPY    . POLYPECTOMY     benign cecum  . SHOULDER SURGERY      right shoulder (dislocation)  . TONSILLECTOMY    . TUBAL LIGATION    . VIDEO BRONCHOSCOPY WITH ENDOBRONCHIAL ULTRASOUND N/A 01/07/2014   Procedure: VIDEO BRONCHOSCOPY WITH ENDOBRONCHIAL ULTRASOUND;  Surgeon: Ivin Poot, MD;  Location[facility] OR;  Service: Thoracic;  Laterality: N/A;    Family history: Family History  Problem Relation Age of Onset  . Heart disease Sister        cerebral vascular disease also  . Heart disease Brother        cerebral vascular disease also  . Heart disease Brother        cerebral vascular disease  . Heart disease Sister        cebreal vascular disease also  . Heart disease Sister        cebreal vascular diease also  . Heart disease Sister        cerebral vascular diease also  . Stomach cancer Father   . Colon cancer Neg Hx   . Esophageal cancer Neg Hx   . Rectal cancer Neg Hx   . Colon polyps Neg Hx   . Asthma Neg Hx     Social history:  Social History   Socioeconomic History  . Marital status: Married    Spouse name: [name]  . Number of children: 2  . Years of education: Not on file  . Highest education level: Not on file  Social Needs  . Financial resource strain: Not on file  . Food insecurity - worry: Not on file  . Food insecurity - inability: Not on file  . Transportation needs - medical: Not on file  . Transportation needs - non-medical: Not on file  Occupational History  . Occupation: Social Worker--Retired  Tobacco Use  . Smoking status: Former Smoker    Packs/day: 0.50    Years: 20.00    Pack years: 10.00    Types: Cigarettes    Last attempt to quit: 05/01/1991    Years since quitting: 26.1  . Smokeless tobacco: Never Used  Substance and Sexual Activity  . Alcohol use: No    Alcohol/week: 0.0 oz  . Drug use: No  . Sexual activity: Yes  Other Topics Concern  . Not on file  Social History Narrative   Daily caffeine    Environmental History: The patient lives in a house with carpeting in the bedroom and central  air/heat.  There is mold/water damage in the home.  She has no pets.  She is a former cigarette smoker having quit in 1991.  Allergies as of 06/18/2017      Reactions   Ciprofloxacin Anaphylaxis      Medication List        Accurate as of 06/18/17  5:42 PM. Always use your most recent med list.          ALPRAZolam 0.25 MG tablet Commonly known as:  XANAX TAKE 1 TABLET THREE TIMES DAILY AS NEEDED FOR ANXIETY   aspirin EC 81 MG tablet Take 81 mg by mouth daily after breakfast.   azelastine 0.1 % nasal spray Commonly known as:  ASTELIN Place 1 spray into both nostrils 2 (two) times daily. Use in each nostril as directed   CARTIA XT 120 MG 24 hr capsule Generic drug:  diltiazem TAKE 1 CAPSULE BY MOUTH DAILY   cetirizine 10 MG tablet Commonly known as:  ZYRTEC Take 10 mg by mouth daily.   docusate sodium 100 MG capsule Commonly known as:  COLACE Take 100 mg by mouth daily as needed for mild constipation.   fluticasone 50 MCG/ACT nasal spray Commonly known as:  FLONASE Place 1 spray into both nostrils 2 (two) times daily.   FULVESTRANT IM Inject into the muscle.   Glucosamine 500 MG Caps Take 500 mg by mouth daily.   meclizine 25 MG tablet Commonly known as:  ANTIVERT TAKE 1 TABLET(25 MG) BY MOUTH EVERY 6 HOURS AS NEEDED FOR DIZZINESS   NON FORMULARY 2 capsules every morning. IB Guard   omeprazole 20 MG capsule Commonly known as:  PRILOSEC Take 1 capsule (20 mg total) by mouth daily.   OVER THE COUNTER MEDICATION Beno for gas, takes twice daily   palbociclib 75 MG capsule Commonly known as:  IBRANCE TAKE 1 CAPSULE BY MOUTH THREE TIMES A WEEK. TAKE EVERY MONDAY, WEDNESDAY, AND FRIDAY FOR 3 WEEKS THEN TAKE 1 WEEK OFF   peppermint oil liquid Take by mouth as needed. IBgard as directed   PROBIOTIC DAILY PO Take 1 tablet by mouth daily.   VITAMIN D PO Take 1,000 Units by mouth 2 (two) times daily.       Known medication allergies: Allergies  Allergen  Reactions  .  Ciprofloxacin Anaphylaxis    I appreciate the opportunity to take part in Jamelia's care. Please do not hesitate to contact me with questions.  Sincerely,   R. Edgar Frisk, MD

## 2017-06-18 NOTE — Patient Instructions (Addendum)
Perennial allergic rhinitis with a nonallergic component  Aeroallergen avoidance measures have been discussed and provided in written form.  A prescription has been provided for fluticasone nasal spray, one spray per nostril 1-2 times daily as needed. Proper nasal spray technique has been discussed and demonstrated.  A prescription has been provided for azelastine nasal spray, one spray per nostril 1-2 times daily as needed.   Nasal saline lavage (NeilMed) has been recommended as needed and prior to medicated nasal sprays along with instructions for proper administration.  For thick post nasal drainage, add guaifenesin 600 mg (Mucinex)  twice daily as needed with adequate hydration as discussed.  GI symptoms/possible food intolerance Gastrointestinal symptoms, uncertain etiology. Skin tests to select food allergens were negative today. The negative predictive value of food allergen skin testing is excellent (approximately 95%). While this does not appear to be an IgE mediated issue, skin testing does not rule out food intolerances or cell-mediated enteropathies which may lend to GI symptoms. These etiologies are suggested when elimination of the responsible food leads to symptom resolution and re-introduction of the food is followed by the return of symptoms.   The patient has been encouraged to keep a careful symptom/food journal and eliminate any food suspected of correlating with symptoms. Should symptoms concerning for anaphylaxis arise, 911 is to be called immediately.  If GI symptoms persist or progress, gastroenterologist evaluation may be warranted.   Return in about 5 months (around 11/15/2017), or if symptoms worsen or fail to improve.  Control of House Dust Mite Allergen  House dust mites play a major role in allergic asthma and rhinitis.  They occur in environments with high humidity wherever human skin, the food for dust mites is found. High levels have been detected in dust  obtained from mattresses, pillows, carpets, upholstered furniture, bed covers, clothes and soft toys.  The principal allergen of the house dust mite is found in its feces.  A gram of dust may contain 1,000 mites and 250,000 fecal particles.  Mite antigen is easily measured in the air during house cleaning activities.    1. Encase mattresses, including the box spring, and pillow, in an air tight cover.  Seal the zipper end of the encased mattresses with wide adhesive tape. 2. Wash the bedding in water of 130 degrees Farenheit weekly.  Avoid cotton comforters/quilts and flannel bedding: the most ideal bed covering is the dacron comforter. 3. Remove all upholstered furniture from the bedroom. 4. Remove carpets, carpet padding, rugs, and non-washable window drapes from the bedroom.  Wash drapes weekly or use plastic window coverings. 5. Remove all non-washable stuffed toys from the bedroom.  Wash stuffed toys weekly. 6. Have the room cleaned frequently with a vacuum cleaner and a damp dust-mop.  The patient should not be in a room which is being cleaned and should wait 1 hour after cleaning before going into the room. 7. Close and seal all heating outlets in the bedroom.  Otherwise, the room will become filled with dust-laden air.  An electric heater can be used to heat the room. 8. Reduce indoor humidity to less than 50%.  Do not use a humidifier.  Control of Mold Allergen  Mold and fungi can grow on a variety of surfaces provided certain temperature and moisture conditions exist.  Outdoor molds grow on plants, decaying vegetation and soil.  The major outdoor mold, Alternaria and Cladosporium, are found in very high numbers during hot and dry conditions.  Generally, a late Summer -  Fall peak is seen for common outdoor fungal spores.  Rain will temporarily lower outdoor mold spore count, but counts rise rapidly when the rainy period ends.  The most important indoor molds are Aspergillus and Penicillium.   Dark, humid and poorly ventilated basements are ideal sites for mold growth.  The next most common sites of mold growth are the bathroom and the kitchen.  Outdoor [employer] 1. Use air conditioning and keep windows closed 2. Avoid exposure to decaying vegetation. 3. Avoid leaf raking. 4. Avoid grain handling. 5. Consider wearing a face mask if working in moldy areas.  Indoor Mold Control 1. Maintain humidity below 50%. 2. Clean washable surfaces with 5% bleach solution. 3. Remove sources e.g. Contaminated carpets.

## 2017-06-18 NOTE — Assessment & Plan Note (Signed)
Gastrointestinal symptoms, uncertain etiology. Skin tests to select food allergens were negative today. The negative predictive value of food allergen skin testing is excellent (approximately 95%). While this does not appear to be an IgE mediated issue, skin testing does not rule out food intolerances or cell-mediated enteropathies which may lend to GI symptoms. These etiologies are suggested when elimination of the responsible food leads to symptom resolution and re-introduction of the food is followed by the return of symptoms.   The patient has been encouraged to keep a careful symptom/food journal and eliminate any food suspected of correlating with symptoms. Should symptoms concerning for anaphylaxis arise, 911 is to be called immediately.  If GI symptoms persist or progress, gastroenterologist evaluation may be warranted.

## 2017-06-20 ENCOUNTER — Telehealth: Payer: Self-pay | Admitting: Gastroenterology

## 2017-06-20 NOTE — Telephone Encounter (Signed)
Line busy, will try back later.

## 2017-06-20 NOTE — Telephone Encounter (Signed)
Patient states she is sick and Dr.Armbruster's nurse will clear something up for her. Requesting to speak with nurse Almyra Free. :)

## 2017-06-21 NOTE — Telephone Encounter (Signed)
Called patient this morning, she had a few days of diarrhea, has been taking Peptobismal. She gets very constipated with imodium. She states the diarrhea has stopped today. I did suggest she get some Pedialyte to help with any possible dehydration. She is still not ready to schedule colonoscopy and will call back when ready to schedule. She understands to call back if not any better.

## 2017-06-21 NOTE — Telephone Encounter (Signed)
I agree with your recommendations. 

## 2017-06-25 ENCOUNTER — Other Ambulatory Visit: Payer: Self-pay | Admitting: Internal Medicine

## 2017-06-27 ENCOUNTER — Other Ambulatory Visit: Payer: Self-pay | Admitting: Internal Medicine

## 2017-06-27 NOTE — Telephone Encounter (Signed)
Pt. Can not wait until Monday as she will be out. Asking to have done today as Dr. Raliegh Ip is out of office tomorrow

## 2017-07-02 MED FILL — IBRANCE 75 MG CAPSULE: 75 | 28 days supply | Qty: 9 | Fill #1

## 2017-07-09 ENCOUNTER — Ambulatory Visit: Payer: Self-pay | Admitting: Allergy and Immunology

## 2017-07-11 ENCOUNTER — Inpatient Hospital Stay: Payer: Medicare Other

## 2017-07-11 ENCOUNTER — Other Ambulatory Visit: Payer: Self-pay | Admitting: Internal Medicine

## 2017-07-11 ENCOUNTER — Inpatient Hospital Stay: Payer: Medicare Other | Attending: Oncology

## 2017-07-11 VITALS — BP 150/80 | HR 70 | Temp 98.2°F | Resp 18

## 2017-07-11 DIAGNOSIS — C7951 Secondary malignant neoplasm of bone: Secondary | ICD-10-CM | POA: Diagnosis not present

## 2017-07-11 DIAGNOSIS — C50411 Malignant neoplasm of upper-outer quadrant of right female breast: Secondary | ICD-10-CM

## 2017-07-11 DIAGNOSIS — C50911 Malignant neoplasm of unspecified site of right female breast: Secondary | ICD-10-CM

## 2017-07-11 DIAGNOSIS — Z853 Personal history of malignant neoplasm of breast: Secondary | ICD-10-CM

## 2017-07-11 DIAGNOSIS — Z5111 Encounter for antineoplastic chemotherapy: Secondary | ICD-10-CM | POA: Insufficient documentation

## 2017-07-11 LAB — COMPREHENSIVE METABOLIC PANEL
ALBUMIN: 3.7 g/dL (ref 3.5–5.0)
ALT: 18 U/L (ref 0–55)
ANION GAP: 6 (ref 3–11)
AST: 17 U/L (ref 5–34)
Alkaline Phosphatase: 85 U/L (ref 40–150)
BILIRUBIN TOTAL: 0.4 mg/dL (ref 0.2–1.2)
BUN: 16 mg/dL (ref 7–26)
CHLORIDE: 103 mmol/L (ref 98–109)
CO2: 30 mmol/L — ABNORMAL HIGH (ref 22–29)
Calcium: 9.6 mg/dL (ref 8.4–10.4)
Creatinine, Ser: 0.86 mg/dL (ref 0.60–1.10)
GFR calc Af Amer: >60 mL/min (ref >60)
GFR calc non Af Amer: >60 mL/min (ref >60)
GLUCOSE: 89 mg/dL (ref 70–140)
POTASSIUM: 4.5 mmol/L (ref 3.5–5.1)
Sodium: 139 mmol/L (ref 136–145)
TOTAL PROTEIN: 6.9 g/dL (ref 6.4–8.3)

## 2017-07-11 LAB — CBC WITH DIFFERENTIAL/PLATELET
BASOS ABS: 0 10*3/uL (ref 0.0–0.1)
BASOS PCT: 1 %
EOS ABS: 0.2 10*3/uL (ref 0.0–0.5)
EOS PCT: 6 %
HEMATOCRIT: 37.3 % (ref 34.8–46.6)
Hemoglobin: 12.8 g/dL (ref 11.6–15.9)
Lymphocytes Relative: 38 %
Lymphs Abs: 1.4 10*3/uL (ref 0.9–3.3)
MCH: 36 pg — ABNORMAL HIGH (ref 25.1–34.0)
MCHC: 34.3 g/dL (ref 31.5–36.0)
MCV: 104.8 fL — ABNORMAL HIGH (ref 79.5–101.0)
MONO ABS: 0.3 10*3/uL (ref 0.1–0.9)
MONOS PCT: 9 %
NEUTROS ABS: 1.7 10*3/uL (ref 1.5–6.5)
Neutrophils Relative %: 46 %
PLATELETS: 145 10*3/uL (ref 145–400)
RBC: 3.56 MIL/uL — ABNORMAL LOW (ref 3.70–5.45)
RDW: 12.8 % (ref 11.2–14.5)
WBC: 3.8 10*3/uL — ABNORMAL LOW (ref 3.9–10.3)

## 2017-07-11 MED ORDER — FULVESTRANT 250 MG/5ML IM SOLN
500.0000 mg | Freq: Once | INTRAMUSCULAR | Status: AC
  Administered 2017-07-11: 500 mg via INTRAMUSCULAR

## 2017-07-11 NOTE — Patient Instructions (Signed)

## 2017-07-12 LAB — CANCER ANTIGEN 27.29: CA 27.29: 57.6 U/mL — ABNORMAL HIGH (ref 0.0–38.6)

## 2017-07-24 ENCOUNTER — Other Ambulatory Visit: Payer: Self-pay | Admitting: Internal Medicine

## 2017-07-26 ENCOUNTER — Other Ambulatory Visit: Payer: Self-pay | Admitting: Family Medicine

## 2017-07-26 NOTE — Telephone Encounter (Signed)
Last OV 04/17/2017   Dr. Raliegh Ip pt   Last refilled 07/24/2017 disp 15 with no refills   Sent to Dr. Sarajane Jews

## 2017-07-29 ENCOUNTER — Telehealth: Payer: Self-pay | Admitting: Internal Medicine

## 2017-07-29 MED FILL — IBRANCE 75 MG CAPSULE: 75 | 28 days supply | Qty: 9 | Fill #2

## 2017-07-29 NOTE — Telephone Encounter (Signed)
Copied from [place] [id number]. Topic: Quick Communication - Rx Refill/Question >> Jul 29, 2017 10:56 AM Robina Ade, Helene Kelp D wrote: Medication: ALPRAZolam Duanne Moron) 0.25 MG tablet, patient takes 3 tabs a day and needs 90 tablets Has the patient contacted their pharmacy? Yes, but patient would like to come in to pick up the rx instead of being send to pharmacy. (Agent: If no, request that the patient contact the pharmacy for the refill.) Preferred Pharmacy (with phone number or street name): [facility] [place], [address] AT [address] Agent: Please be advised that RX refills may take up to 3 business days. We ask that you follow-up with your pharmacy.

## 2017-07-29 NOTE — Telephone Encounter (Signed)
Xanax LOV: 04/17/17 PCP: Dr Inda Merlin Pharmacy: pt would like to pick up written prescription in [place]

## 2017-07-30 NOTE — Telephone Encounter (Signed)
Okay for #90

## 2017-07-30 NOTE — Telephone Encounter (Signed)
Patient called and is confused because she normally gets Alprazolam #90 and has now received #30 which is only 10 days worth. She needs another rx for the correct amount of #90.  Dr. Raliegh Ip - Please resend rx for correct amount, #90, as Dr. Sarajane Jews only sent in #30. THANK YOU!

## 2017-07-31 MED ORDER — ALPRAZOLAM 0.25 MG PO TABS: 0.2500 mg | ORAL_TABLET | Freq: Three times a day (TID) | ORAL | 1 refills | Status: DC | PRN

## 2017-07-31 NOTE — Telephone Encounter (Signed)
Refill phoned in to pharmacy. Pt advised.

## 2017-08-07 NOTE — Progress Notes (Signed)
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   She continues on fulvestrant monthly, with a dose due today.  She tolerates this withno unusual side effects  Her current palbociclib dose is 75 mg on Mondays Wednesdays and Fridays 3 weeks on and one-week off. She tolerates well, however, she adds that anytime anything goes wrong with her she thinks it is because of this medication.   REVIEW OF SYSTEMS: Hiedi states her biggest problem is numbness to her fingers and toes, that is the worst at night. She also endorses night sweats. Majority of the time she is very cold and she will wear layers to keep herself warm. She feels that her immune system is very low. About three weeks ago she went to the dentist and they found a severe infection in one of her teeth. She plans to get an implant in the future. She was given an antibiotic and while on it she did not experience diarrhea. She denies unusual headaches, visual changes, nausea, vomiting, or dizziness. There has been no unusual cough, phlegm production, or pleurisy. This been no change in bowel or bladder habits. She denies unexplained fatigue or unexplained weight loss, bleeding, rash, or fever. A  detailed review of systems was otherwise noncontributory.     BREAST CANCER HISTORY: From the earlier summary note  Erica Young tells me in 2007 while living in [place] she was found to have a very small cancer in the upper-outer quadrant of the right breast, about the size of the P, noted on mammography. It was not palpable. She had a right lumpectomy and full sentinel lymph node sampling. She then received adjuvant radiation, to a total of 33 treatments. She received no systemic therapy.  She then did well until December 2014, when she had a fall and complain of pain in her left ribs. Rib films and chest x-ray on 04/13/2013 showed no fractures, but CT of the abdomen and pelvis on the same day found subcarinal and right hilar adenopathy. This was followed up with a chest CT scan 05/05/2013 confirming extensive mediastinal and right hilar lymphadenopathy, with multiple pulmonary nodules, the largest measuring 0.9 cm. PET scan 05/21/2013 showed hypermetabolic adenopathy in the right and left paratracheal areas, the precarinal, subcarinal and right hilar areas, but no involvement of the liver, and the lung nodules were not hypermetabolic (although they were below the level of reliable detection). In addition, at L5 there was a lucency measuring 1.2 cm.  The PET scan also showed a hypermetabolic focus on the thyroid gland, which was evaluated with neck ultrasound and biopsy 09/32/3557, showing a follicular lesion of undetermined significance (([id number] [id number]).  The patient was referred to pulmonary [Dr Melvyn Novas and Dr Julien Nordmann for further evaluation. Repeat PET scan 12/28/2013 showed, in addition to the adenopathy previously noted, now multiple bony metastases. On 01/07/2014 the patient underwent bronchoscopy  and this showed (SZA [id number], together with separate cytology] a low-grade mucinous invasive breast cancer, estrogen receptor 100% positive, progesterone receptor 14% positive, with no HER-2 amplification, the  signals ratio being 1.30 and the number per cell 1.95.  The patient was started on anastrozole 01/22/2014; monthly denosumab/Xgeva was added 07/30/2014. She appeared to tolerate this well, and her CA-27-29 (127 at baseline) normalized. Most recent CT scans of chest abdomen and pelvis 10/03/2015 showed continuing response. Despite this good news however, the patient decided to go off anastrozole and denosumab/Xgeva as of June 2017. By the time she saw Dr. Earlie Server again in September 2017 her tumor marker had doubled. At that time the patient was referred to the breast clinic for a second opinion regarding further evaluation and treatment.   PAST MEDICAL HISTORY: Past Medical History:  Diagnosis Date  . Allergy   . Anxiety   . Bone cancer ([place]) mri 11/11/14   right parietal bone,left cribriform plate metastases  . Cancer [facility]) 2007   right breat ca  , lumpectomy and radiation tx (declined chemo and additional prophylactic meds)  . Cataract    both eyes  10/2016 Lt.     04/2017 Rt.   . CEREBROVASCULAR DISEASE 03/03/2010  . Diverticulitis   . DIVERTICULOSIS, COLON 03/03/2010  . Fatty liver 08/02/09   as per U/S done by [facility]  . Foramen ovale    positive bubble study  . GERD (gastroesophageal reflux disease)   . Headache(784.0)    she thinks sinus headaches  . History of cerebral artery stenosis    right middle  . HYPERLIPIDEMIA 03/03/2010  . Hypertension   . HYPOTHYROIDISM 03/03/2010   no longer on meds  . Internal hemorrhoid   . Low back pain   . Mastoiditis    noted on brain MRI  . Rib fracture   . Right leg pain   . Shortness of breath   . Stroke [facility]) Oct. 2011   TIA  . Ulcer     PAST SURGICAL HISTORY: Past Surgical History:  Procedure Laterality Date  . BREAST LUMPECTOMY     right  . CATARACT EXTRACTION Left    10/2016   Rt 04/2017  . CHOLECYSTECTOMY    . COLONOSCOPY    . POLYPECTOMY     benign cecum  . SHOULDER SURGERY     right shoulder (dislocation)   . TONSILLECTOMY    . TUBAL LIGATION    . VIDEO BRONCHOSCOPY WITH ENDOBRONCHIAL ULTRASOUND N/A 01/07/2014   Procedure: VIDEO BRONCHOSCOPY WITH ENDOBRONCHIAL ULTRASOUND;  Surgeon: Ivin Poot, MD;  [facility] OR;  Service: Thoracic;  Laterality: N/A;    FAMILY HISTORY Family History  Problem Relation Age of Onset  . Heart disease Sister        cerebral vascular disease also  . Heart disease Brother        cerebral vascular disease also  . Heart disease Brother        cerebral vascular disease  . Heart disease Sister        cebreal vascular disease also  . Heart disease Sister        cebreal vascular diease also  . Heart disease Sister        cerebral vascular diease also  . Stomach cancer Father   . Colon cancer Neg Hx   . Esophageal cancer Neg Hx   . Rectal cancer Neg Hx   . Colon polyps Neg Hx   . Asthma Neg Hx  The patient's father died from stomach cancer in his late 47s. The patient's mother died in her 61s from a stroke. The patient has 2 brothers, 4 sisters. One sister had leukemia. There is no history of breast, colon, or ovarian cancer in the family to her knowledge  GYNECOLOGIC HISTORY:  No LMP recorded. Patient is postmenopausal. Menarche age 76, first live birth age 22, the patient is Erica Young P2. She went through menopause in her late 42s. She took no hormone replacement. She took oral contraceptives for approximately 2 years remotely without complications.  SOCIAL HISTORY:  Erica Young is originally from [place], [place]. She worked as a Education officer, museum, particularly, in the field of substance abuse. Her husband, Mortimer Fries, is a retired substance abuse Social worker. This is a second marriage for both of them. Kendrick has 2 children from her first marriage, [name], lives in [place], and worked as a Audiological scientist but is now disabled, and Lawrence Santiago, who lives in [place] and works in Engineer, mining. The patient has 6 grandchildren. She attends [employer].--Mikki Santee has 3 children from his first marriage. [name] Junior lives in [place] and has his own trucking business. He tells me he is estranged from his 2 daughters Amedeo Gory (a retired Pharmacist, hospital) and Salena Saner (who works in a bank). They are living on [place].   ADVANCED DIRECTIVES: in place   HEALTH MAINTENANCE: Social History   Tobacco Use  . Smoking status: Former Smoker    Packs/day: 0.50    Years: 20.00    Pack years: 10.00    Types: Cigarettes    Last attempt to quit: 05/01/1991    Years since quitting: 26.2  . Smokeless tobacco: Never Used  Substance Use Topics  . Alcohol use: No    Alcohol/week: 0.0 oz  . Drug use: No     Colonoscopy:  PAP:  Bone density:   Allergies  Allergen Reactions  . Ciprofloxacin Anaphylaxis    Current Outpatient Medications  Medication Sig Dispense Refill  . ALPRAZolam (XANAX) 0.25 MG tablet Take 1 tablet (0.25 mg total) by mouth 3 (three) times daily as needed. for anxiety 90 tablet 1  . aspirin EC 81 MG tablet Take 81 mg by mouth daily after breakfast.     . azelastine (ASTELIN) 0.1 % nasal spray Place 1 spray into both nostrils 2 (two) times daily. Use in each nostril as directed 30 mL 5  . CARTIA XT 120 MG 24 hr capsule TAKE 1 CAPSULE BY MOUTH DAILY 90 capsule 0  . cetirizine (ZYRTEC) 10 MG tablet Take 10 mg by mouth daily.    . Cholecalciferol (VITAMIN D PO) Take 1,000 Units by mouth 2 (two) times daily.    Marland Kitchen docusate sodium (COLACE) 100 MG capsule Take 100 mg by mouth daily as needed for mild constipation.    . fluticasone (FLONASE) 50 MCG/ACT nasal spray Place 1 spray into both nostrils 2 (two) times daily. 18.2 g 5  . FULVESTRANT IM Inject into the muscle.    . Glucosamine 500 MG CAPS Take 500 mg by mouth daily.     . meclizine (ANTIVERT) 25 MG tablet TAKE 1 TABLET(25 MG) BY MOUTH EVERY 6 HOURS AS NEEDED FOR DIZZINESS 60 tablet 0  . NON FORMULARY 2 capsules every morning. IB Guard    . omeprazole (PRILOSEC) 20 MG capsule Take  1 capsule (20 mg total) by mouth daily. (Patient taking differently: Take 20 mg by mouth daily as needed (reflux). ) 90 capsule  4  . OVER THE COUNTER MEDICATION Beno for gas, takes twice daily    . palbociclib (IBRANCE) 75 MG capsule TAKE 1 CAPSULE BY MOUTH THREE TIMES A WEEK. TAKE EVERY MONDAY, WEDNESDAY, AND FRIDAY FOR 3 WEEKS THEN TAKE 1 WEEK OFF 9 capsule 6  . peppermint oil liquid Take by mouth as needed. IBgard as directed 30 mL 12  . Probiotic Product (PROBIOTIC DAILY PO) Take 1 tablet by mouth daily.      No current facility-administered medications for this visit.    Facility-Administered Medications Ordered in Other Visits  Medication Dose Route Frequency Provider Last Rate Last Dose  . fulvestrant (FASLODEX) injection 500 mg  500 mg Intramuscular Once Magrinat, Virgie Dad, MD        OBJECTIVE: Middle-aged Latin American woman in no acute distress  Vitals:   08/08/17 0952  BP: (!) 146/67  Pulse: 63  Resp: 20  Temp: (!) 97.4 F (36.3 C)  SpO2: 99%     Body mass index is 24.78 kg/m.    ECOG FS:1 - Symptomatic but completely ambulatory  Sclerae unicteric, EOMs intact No cervical or supraclavicular adenopathy Lungs no rales or rhonchi Heart regular rate and rhythm Abd soft, nontender, positive bowel sounds MSK no focal spinal tenderness, no upper extremity lymphedema Neuro: nonfocal, well oriented, appropriate affect Breasts: The right breast is status post lumpectomy and radiation.  Is no evidence of local recurrence.  The left breast is benign.  Both axillae are benign.    LAB RESULTS:  CMP     Component Value Date/Time   NA 139 07/11/2017 0957   NA 138 04/18/2017 0853   K 4.5 07/11/2017 0957   K 4.4 04/18/2017 0853   CL 103 07/11/2017 0957   CO2 30 (H) 07/11/2017 0957   CO2 29 04/18/2017 0853   GLUCOSE 89 07/11/2017 0957   GLUCOSE 95 04/18/2017 0853   BUN 16 07/11/2017 0957   BUN 16.6 04/18/2017 0853   CREATININE 0.86 07/11/2017 0957   CREATININE 0.8  04/18/2017 0853   CALCIUM 9.6 07/11/2017 0957   CALCIUM 9.2 04/18/2017 0853   PROT 6.9 07/11/2017 0957   PROT 7.0 04/18/2017 0853   ALBUMIN 3.7 07/11/2017 0957   ALBUMIN 3.8 04/18/2017 0853   AST 17 07/11/2017 0957   AST 17 04/18/2017 0853   ALT 18 07/11/2017 0957   ALT 16 04/18/2017 0853   ALKPHOS 85 07/11/2017 0957   ALKPHOS 90 04/18/2017 0853   BILITOT 0.4 07/11/2017 0957   BILITOT 0.63 04/18/2017 0853   GFRNONAA >60 07/11/2017 0957   GFRAA >60 07/11/2017 0957    INo results found for: SPEP, UPEP  Lab Results  Component Value Date   WBC 3.4 (L) 08/08/2017   NEUTROABS 1.5 08/08/2017   HGB 13.0 08/08/2017   HCT 38.3 08/08/2017   MCV 105.2 (H) 08/08/2017   PLT 132 (L) 08/08/2017      Chemistry      Component Value Date/Time   NA 139 07/11/2017 0957   NA 138 04/18/2017 0853   K 4.5 07/11/2017 0957   K 4.4 04/18/2017 0853   CL 103 07/11/2017 0957   CO2 30 (H) 07/11/2017 0957   CO2 29 04/18/2017 0853   BUN 16 07/11/2017 0957   BUN 16.6 04/18/2017 0853   CREATININE 0.86 07/11/2017 0957   CREATININE 0.8 04/18/2017 0853      Component Value Date/Time   CALCIUM 9.6 07/11/2017 0957   CALCIUM 9.2 04/18/2017 0853   ALKPHOS 85 07/11/2017  0957   ALKPHOS 90 04/18/2017 0853   AST 17 07/11/2017 0957   AST 17 04/18/2017 0853   ALT 18 07/11/2017 0957   ALT 16 04/18/2017 0853   BILITOT 0.4 07/11/2017 0957   BILITOT 0.63 04/18/2017 0853      Most recent CA-27-29 had risen to 57.6.  This is being repeated today  No components found for: LABCA125  No results for input(s): INR in the last 168 hours.  Urinalysis    Component Value Date/Time   COLORURINE STRAW (A) 11/19/2016 1750   APPEARANCEUR CLEAR 11/19/2016 1750   LABSPEC 1.003 (L) 11/19/2016 1750   PHURINE 7.0 11/19/2016 1750   GLUCOSEU NEGATIVE 11/19/2016 1750   HGBUR NEGATIVE 11/19/2016 1750   BILIRUBINUR NEGATIVE 11/19/2016 1750   BILIRUBINUR neg 02/16/2015 1318   KETONESUR NEGATIVE 11/19/2016 1750    PROTEINUR NEGATIVE 11/19/2016 1750   UROBILINOGEN 0.2 02/16/2015 1318   UROBILINOGEN 0.2 11/18/2013 1321   NITRITE NEGATIVE 11/19/2016 1750   LEUKOCYTESUR TRACE (A) 11/19/2016 1750     STUDIES: No results found.   ELIGIBLE FOR AVAILABLE RESEARCH PROTOCOL: no  ASSESSMENT: 77 y.o. [place] woman  (1) status post right breast upper outer quadrant lumpectomy and axillary lymph node dissection in 2007 for what appears to have been a T1 N0, stage IA invasive ductal carcinoma, treated adjuvantly with radiation (33 sessions)  METASTATIC DISEASE definitively documented Sept 2015 (2) bronchoscopic biopsy 01/07/2014 showed a low-grade mucinous breast cancer, strongly estrogen receptor positive, progesterone receptor positive, HER-2 negative; staging studies confirmed extensive hypermetabolic adenopathy, multiple bone lesions, likely early lung involvement, but no liver lesions.  (a) bone scan and chest CT 07/09/2016 shows stable disease.  (b) CA-27-29 is moderately informative  (c) bone scan and CT scan of the chest 04/24/2017 shows essentially stable disease  (3) on Arimidex between September 2015 and June 2017, with evidence of response; discontinued secondary to side effects  (a) bone density 04/10/2016 shows osteopenia with a T score of -2.1  (4) on monthly denosumab/Xgeva between 07/30/2014 and 10/04/2015, discontinued due to patient's concerns regarding osteonecrosis of the jaw  (a) discussed again October 2017, the patient adamantly refusing denosumab  (5) started fulvestrant 01/26/2016    (a) Palbociclib added at 75 mg M/W/F, beginning mid February 2018  PLAN  Erica Young is now 3-1/2 years out from definitive diagnosis of metastatic breast cancer.  Clinically she is very stable and I cannot relate any of her problems to her cancer diagnosis at present.  She is tolerating the fulvestrant and palbociclib remarkably well.  The plan will be to continue those until there is evidence of  disease progression.  We are following the CA-27-29.  If there is a steady rise we will restage.  However today she refused CT of the chest, bone scan and mammogram, because all of them involve radiation.  We are continuing monthly treatments and she will see me again in July.  She was urged to call with any problems that may develop before that visit.  Magrinat, Virgie Dad, MD  08/08/17 10:01 AM Medical Oncology and Hematology [facility] [address] [place], [place] [address] Tel. [phone]    Fax. [phone]  This document serves as a record of services personally performed by Chauncey Cruel, MD. It was created on his behalf by Margit Banda, a trained medical scribe. The creation of this record is based on the scribe's personal observations and the provider's statements to them.   I have reviewed the above  documentation for accuracy and completeness, and I agree with the above.

## 2017-08-08 ENCOUNTER — Inpatient Hospital Stay (HOSPITAL_BASED_OUTPATIENT_CLINIC_OR_DEPARTMENT_OTHER): Payer: Medicare Other | Admitting: Oncology

## 2017-08-08 ENCOUNTER — Telehealth: Payer: Self-pay | Admitting: Oncology

## 2017-08-08 ENCOUNTER — Inpatient Hospital Stay: Payer: Medicare Other | Attending: Oncology

## 2017-08-08 ENCOUNTER — Ambulatory Visit: Payer: Medicare Other

## 2017-08-08 ENCOUNTER — Inpatient Hospital Stay: Payer: Medicare Other

## 2017-08-08 VITALS — BP 146/67 | HR 63 | Temp 97.4°F | Resp 20 | Ht 60.0 in | Wt 126.9 lb

## 2017-08-08 DIAGNOSIS — R200 Anesthesia of skin: Secondary | ICD-10-CM

## 2017-08-08 DIAGNOSIS — C50411 Malignant neoplasm of upper-outer quadrant of right female breast: Secondary | ICD-10-CM | POA: Diagnosis not present

## 2017-08-08 DIAGNOSIS — Z853 Personal history of malignant neoplasm of breast: Secondary | ICD-10-CM

## 2017-08-08 DIAGNOSIS — C50911 Malignant neoplasm of unspecified site of right female breast: Secondary | ICD-10-CM

## 2017-08-08 DIAGNOSIS — R61 Generalized hyperhidrosis: Secondary | ICD-10-CM | POA: Diagnosis not present

## 2017-08-08 DIAGNOSIS — C7951 Secondary malignant neoplasm of bone: Secondary | ICD-10-CM | POA: Diagnosis not present

## 2017-08-08 DIAGNOSIS — Z5111 Encounter for antineoplastic chemotherapy: Secondary | ICD-10-CM | POA: Insufficient documentation

## 2017-08-08 DIAGNOSIS — Z170 Estrogen receptor positive status [ER+]: Secondary | ICD-10-CM | POA: Diagnosis not present

## 2017-08-08 LAB — CBC WITH DIFFERENTIAL/PLATELET
BASOS ABS: 0 10*3/uL (ref 0.0–0.1)
BASOS PCT: 1 %
EOS ABS: 0.1 10*3/uL (ref 0.0–0.5)
EOS PCT: 4 %
HCT: 38.3 % (ref 34.8–46.6)
Hemoglobin: 13 g/dL (ref 11.6–15.9)
Lymphocytes Relative: 45 %
Lymphs Abs: 1.5 10*3/uL (ref 0.9–3.3)
MCH: 35.7 pg — ABNORMAL HIGH (ref 25.1–34.0)
MCHC: 33.9 g/dL (ref 31.5–36.0)
MCV: 105.2 fL — ABNORMAL HIGH (ref 79.5–101.0)
MONO ABS: 0.2 10*3/uL (ref 0.1–0.9)
Monocytes Relative: 6 %
Neutro Abs: 1.5 10*3/uL (ref 1.5–6.5)
Neutrophils Relative %: 44 %
PLATELETS: 132 10*3/uL — AB (ref 145–400)
RBC: 3.64 MIL/uL — ABNORMAL LOW (ref 3.70–5.45)
RDW: 13.1 % (ref 11.2–14.5)
WBC: 3.4 10*3/uL — ABNORMAL LOW (ref 3.9–10.3)

## 2017-08-08 LAB — COMPREHENSIVE METABOLIC PANEL
ALBUMIN: 3.8 g/dL (ref 3.5–5.0)
ALT: 16 U/L (ref 0–55)
AST: 18 U/L (ref 5–34)
Alkaline Phosphatase: 87 U/L (ref 40–150)
Anion gap: 8 (ref 3–11)
BUN: 16 mg/dL (ref 7–26)
CHLORIDE: 104 mmol/L (ref 98–109)
CO2: 28 mmol/L (ref 22–29)
Calcium: 9.7 mg/dL (ref 8.4–10.4)
Creatinine, Ser: 0.83 mg/dL (ref 0.60–1.10)
GFR calc Af Amer: >60 mL/min (ref >60)
GLUCOSE: 84 mg/dL (ref 70–140)
POTASSIUM: 4.7 mmol/L (ref 3.5–5.1)
Sodium: 140 mmol/L (ref 136–145)
Total Bilirubin: 0.4 mg/dL (ref 0.2–1.2)
Total Protein: 7.1 g/dL (ref 6.4–8.3)

## 2017-08-08 MED ORDER — FULVESTRANT 250 MG/5ML IM SOLN
INTRAMUSCULAR | Status: AC
  Filled 2017-08-08: qty 10

## 2017-08-08 MED ORDER — FULVESTRANT 250 MG/5ML IM SOLN
500.0000 mg | Freq: Once | INTRAMUSCULAR | Status: AC
  Administered 2017-08-08: 500 mg via INTRAMUSCULAR

## 2017-08-08 NOTE — Telephone Encounter (Signed)
Gave patient AVs and calendar of upcoming appointments.  °

## 2017-08-09 LAB — CANCER ANTIGEN 27.29: CAN 27.29: 56.3 U/mL — AB (ref 0.0–38.6)

## 2017-08-13 ENCOUNTER — Telehealth: Payer: Self-pay | Admitting: Gastroenterology

## 2017-08-13 NOTE — Telephone Encounter (Signed)
Pt would like to speak with you regarding a colonoscopy, she mentioned that you told her to call you. Thank you

## 2017-08-14 NOTE — Telephone Encounter (Signed)
Tried calling patient, line busy. If patient is calling to reschedule her colonoscopy, she can schedule with anyone. Patient will also need a pre-visit appointment.

## 2017-08-14 NOTE — Telephone Encounter (Signed)
Spoke to patient she is rescheduled for colonoscopy and pre-visit at [facility].

## 2017-08-23 ENCOUNTER — Other Ambulatory Visit: Payer: Self-pay

## 2017-08-23 ENCOUNTER — Ambulatory Visit (AMBULATORY_SURGERY_CENTER): Payer: Self-pay | Admitting: *Deleted

## 2017-08-23 VITALS — Ht 62.0 in | Wt 126.4 lb

## 2017-08-23 DIAGNOSIS — Z860100 Personal history of colon polyps, unspecified: Secondary | ICD-10-CM

## 2017-08-23 DIAGNOSIS — R109 Unspecified abdominal pain: Secondary | ICD-10-CM

## 2017-08-23 DIAGNOSIS — R140 Abdominal distension (gaseous): Secondary | ICD-10-CM

## 2017-08-23 NOTE — Progress Notes (Signed)
No egg or soy allergy  No home oxygen used or hx of sleep apnea  No anesthesia or intubation problems per pt  No diet mediations taken  Registered in [place] had a PV in January but had to cancel procedure- she has Miralax prep at home so instructions for Miralax given

## 2017-08-26 MED FILL — IBRANCE 75 MG CAPSULE: 75 | 28 days supply | Qty: 9 | Fill #3

## 2017-08-29 ENCOUNTER — Ambulatory Visit (AMBULATORY_SURGERY_CENTER): Payer: Medicare Other | Admitting: Gastroenterology

## 2017-08-29 ENCOUNTER — Encounter: Payer: Self-pay | Admitting: Gastroenterology

## 2017-08-29 ENCOUNTER — Other Ambulatory Visit: Payer: Self-pay

## 2017-08-29 VITALS — BP 155/75 | HR 78 | Temp 98.0°F | Resp 19 | Ht 62.0 in | Wt 126.0 lb

## 2017-08-29 DIAGNOSIS — R109 Unspecified abdominal pain: Secondary | ICD-10-CM

## 2017-08-29 DIAGNOSIS — Z8673 Personal history of transient ischemic attack (TIA), and cerebral infarction without residual deficits: Secondary | ICD-10-CM | POA: Diagnosis not present

## 2017-08-29 DIAGNOSIS — K5792 Diverticulitis of intestine, part unspecified, without perforation or abscess without bleeding: Secondary | ICD-10-CM

## 2017-08-29 MED ORDER — SODIUM CHLORIDE 0.9 % IV SOLN: 500.0000 mL | Freq: Once | INTRAVENOUS | Status: DC

## 2017-08-29 NOTE — Patient Instructions (Signed)
YOU HAD AN ENDOSCOPIC PROCEDURE TODAY AT [facility]:   Refer to the procedure report that was given to you for any specific questions about what was found during the examination.  If the procedure report does not answer your questions, please call your gastroenterologist to clarify.  If you requested that your care partner not be given the details of your procedure findings, then the procedure report has been included in a sealed envelope for you to review at your convenience later.  YOU SHOULD EXPECT: Some feelings of bloating in the abdomen. Passage of more gas than usual.  Walking can help get rid of the air that was put into your GI tract during the procedure and reduce the bloating. If you had a lower endoscopy (such as a colonoscopy or flexible sigmoidoscopy) you may notice spotting of blood in your stool or on the toilet paper. If you underwent a bowel prep for your procedure, you may not have a normal bowel movement for a few days.  Please Note:  You might notice some irritation and congestion in your nose or some drainage.  This is from the oxygen used during your procedure.  There is no need for concern and it should clear up in a day or so.  SYMPTOMS TO REPORT IMMEDIATELY:   Following lower endoscopy (colonoscopy or flexible sigmoidoscopy):  Excessive amounts of blood in the stool  Significant tenderness or worsening of abdominal pains  Swelling of the abdomen that is new, acute  Fever of 100F or higher  For urgent or emergent issues, a gastroenterologist can be reached at any hour by calling [phone].   DIET:  We do recommend a small meal at first, but then you may proceed to your regular diet.  Drink plenty of fluids but you should avoid alcoholic beverages for 24 hours.  ACTIVITY:  You should plan to take it easy for the rest of today and you should NOT DRIVE or use heavy machinery until tomorrow (because of the sedation medicines used during the test).     FOLLOW UP: Our staff will call the number listed on your records the next business day following your procedure to check on you and address any questions or concerns that you may have regarding the information given to you following your procedure. If we do not reach you, we will leave a message.  However, if you are feeling well and you are not experiencing any problems, there is no need to return our call.  We will assume that you have returned to your regular daily activities without incident.  If any biopsies were taken you will be contacted by phone or by letter within the next 1-3 weeks.  Please call us at [phone] if you have not heard about the biopsies in 3 weeks.  Per Dr. Havery Moros pt will need to repeat Colonoscopy screening using a two day prep. Advised pt to think it over and let Dr. Havery Moros know if they would like to repeat the procedure.  SIGNATURES/CONFIDENTIALITY: You and/or your care partner have signed paperwork which will be entered into your electronic medical record.  These signatures attest to the fact that that the information above on your After Visit Summary has been reviewed and is understood.  Full responsibility of the confidentiality of this discharge information lies with you and/or your care-partner.

## 2017-08-29 NOTE — Progress Notes (Signed)
Pt's states no medical or surgical changes since previsit or office visit. 

## 2017-08-29 NOTE — Op Note (Signed)
[place] Patient Name: Erica Young Procedure Date: 08/29/2017 8:00 AM MRN: [id number] Endoscopist: Remo Lipps P.  MD, MD Age: 77 Referring MD:  Date of Birth: Jul 04, 1940 Gender: Female Account #: [id number] Procedure:                Colonoscopy Indications:              Abdominal pain, Follow-up of diverticulitis Medicines:                Monitored Anesthesia Care Procedure:                Pre-Anesthesia Assessment:                           - Prior to the procedure, a History and Physical                            was performed, and patient medications and                            allergies were reviewed. The patient's tolerance of                            previous anesthesia was also reviewed. The risks                            and benefits of the procedure and the sedation                            options and risks were discussed with the patient.                            All questions were answered, and informed consent                            was obtained. Prior Anticoagulants: The patient has                            taken no previous anticoagulant or antiplatelet                            agents. ASA Grade Assessment: III - A patient with                            severe systemic disease. After reviewing the risks                            and benefits, the patient was deemed in                            satisfactory condition to undergo the procedure.                           After obtaining informed consent, the colonoscope  was passed under direct vision. Throughout the                            procedure, the patient's blood pressure, pulse, and                            oxygen saturations were monitored continuously. The                            Colonoscope was introduced through the anus with                            the intention of advancing to the cecum. The scope                            was  advanced to the sigmoid colon before the                            procedure was aborted. Medications were given. The                            colonoscopy was technically difficult and complex                            due to inadequate bowel prep. The patient tolerated                            the procedure well. The quality of the bowel                            preparation was inadequate. The rectum was                            photographed. Scope In: 8:04:17 AM Scope Out: 8:17:14 AM Total Procedure Duration: 0 hours 12 minutes 57 seconds  Findings:                 The perianal and digital rectal examinations were                            normal.                           Multiple medium-mouthed diverticula were found in                            the sigmoid colon.                           Hard stool was found in the sigmoid colon,                            precluding visualization. Attempts using lavage and  attempt at removal with Jabier Mutton net was not successful                            to clear the area and the procedure was aborted.                           The exam was otherwise without abnormality. Complications:            No immediate complications. Estimated blood loss:                            None. Estimated Blood Loss:     Estimated blood loss: none. Impression:               - Preparation of the colon was inadequate, stool in                            the sigmoid colon which could not be cleared and                            the procedure was aborted..                           - Diverticulosis in the sigmoid colon.                           - The examination was otherwise normal.                           - No specimens collected. Recommendation:           - Patient has a contact number available for                            emergencies. The signs and symptoms of potential                            delayed complications were  discussed with the                            patient. Return to normal activities tomorrow.                            Written discharge instructions were provided to the                            patient.                           - Resume previous diet.                           - Continue present medications.                           - Repeat colonoscopy with 2 day prep if the patient  is willing to do this at some point in time, will                            discuss options with her. Remo Lipps P.  MD, MD 08/29/2017 8:24:42 AM This report has been signed electronically.

## 2017-08-29 NOTE — Progress Notes (Signed)
Report given to PACU, vss 

## 2017-08-30 ENCOUNTER — Telehealth: Payer: Self-pay | Admitting: *Deleted

## 2017-08-30 NOTE — Telephone Encounter (Signed)
  Follow up Call-  Call back number 08/29/2017  Post procedure Call Back phone  # [phone]  Permission to leave phone message Yes  Some recent data might be hidden     Patient questions:  Do you have a fever, pain , or abdominal swelling? No. Pain Score  0 *  Have you tolerated food without any problems? Yes.    Have you been able to return to your normal activities? Yes.    Do you have any questions about your discharge instructions: Diet   No. Medications  No. Follow up visit  No.  Do you have questions or concerns about your Care? No.  Actions: * If pain score is 4 or above: No action needed, pain <4.

## 2017-09-12 ENCOUNTER — Inpatient Hospital Stay: Payer: Medicare Other | Attending: Oncology

## 2017-09-12 ENCOUNTER — Inpatient Hospital Stay: Payer: Medicare Other

## 2017-09-12 VITALS — BP 140/76 | HR 68 | Temp 97.8°F | Resp 20

## 2017-09-12 DIAGNOSIS — Z5111 Encounter for antineoplastic chemotherapy: Secondary | ICD-10-CM | POA: Diagnosis not present

## 2017-09-12 DIAGNOSIS — Z853 Personal history of malignant neoplasm of breast: Secondary | ICD-10-CM

## 2017-09-12 DIAGNOSIS — C50411 Malignant neoplasm of upper-outer quadrant of right female breast: Secondary | ICD-10-CM | POA: Diagnosis not present

## 2017-09-12 DIAGNOSIS — C7951 Secondary malignant neoplasm of bone: Secondary | ICD-10-CM

## 2017-09-12 DIAGNOSIS — C50911 Malignant neoplasm of unspecified site of right female breast: Secondary | ICD-10-CM

## 2017-09-12 LAB — CBC WITH DIFFERENTIAL/PLATELET
BASOS ABS: 0 10*3/uL (ref 0.0–0.1)
Basophils Relative: 1 %
Eosinophils Absolute: 0.1 10*3/uL (ref 0.0–0.5)
Eosinophils Relative: 3 %
HEMATOCRIT: 37.4 % (ref 34.8–46.6)
Hemoglobin: 12.9 g/dL (ref 11.6–15.9)
LYMPHS PCT: 37 %
Lymphs Abs: 1.2 10*3/uL (ref 0.9–3.3)
MCH: 36.4 pg — ABNORMAL HIGH (ref 25.1–34.0)
MCHC: 34.6 g/dL (ref 31.5–36.0)
MCV: 105.1 fL — AB (ref 79.5–101.0)
Monocytes Absolute: 0.2 10*3/uL (ref 0.1–0.9)
Monocytes Relative: 6 %
NEUTROS ABS: 1.7 10*3/uL (ref 1.5–6.5)
NEUTROS PCT: 53 %
Platelets: 208 10*3/uL (ref 145–400)
RBC: 3.56 MIL/uL — AB (ref 3.70–5.45)
RDW: 13.1 % (ref 11.2–14.5)
WBC: 3.2 10*3/uL — AB (ref 3.9–10.3)

## 2017-09-12 LAB — COMPREHENSIVE METABOLIC PANEL
ALT: 18 U/L (ref 0–55)
AST: 19 U/L (ref 5–34)
Albumin: 4 g/dL (ref 3.5–5.0)
Alkaline Phosphatase: 79 U/L (ref 40–150)
Anion gap: 7 (ref 3–11)
BILIRUBIN TOTAL: 0.3 mg/dL (ref 0.2–1.2)
BUN: 15 mg/dL (ref 7–26)
CHLORIDE: 101 mmol/L (ref 98–109)
CO2: 28 mmol/L (ref 22–29)
CREATININE: 0.8 mg/dL (ref 0.60–1.10)
Calcium: 9.5 mg/dL (ref 8.4–10.4)
GFR calc Af Amer: >60 mL/min (ref >60)
GLUCOSE: 90 mg/dL (ref 70–140)
Potassium: 4.3 mmol/L (ref 3.5–5.1)
Sodium: 136 mmol/L (ref 136–145)
Total Protein: 7.2 g/dL (ref 6.4–8.3)

## 2017-09-12 MED ORDER — FULVESTRANT 250 MG/5ML IM SOLN
500.0000 mg | Freq: Once | INTRAMUSCULAR | Status: AC
  Administered 2017-09-12: 500 mg via INTRAMUSCULAR

## 2017-09-12 MED ORDER — FULVESTRANT 250 MG/5ML IM SOLN
INTRAMUSCULAR | Status: AC
  Filled 2017-09-12: qty 5

## 2017-09-12 NOTE — Patient Instructions (Signed)

## 2017-09-13 ENCOUNTER — Telehealth: Payer: Self-pay | Admitting: Internal Medicine

## 2017-09-13 LAB — CANCER ANTIGEN 27.29: CAN 27.29: 63.1 U/mL — AB (ref 0.0–38.6)

## 2017-09-13 NOTE — Telephone Encounter (Signed)
Copied from [place] [id number]. Topic: Quick Communication - See Telephone Encounter >> Sep 13, 2017  9:11 AM Ether Griffins B wrote: CRM for notification. See Telephone encounter for: 09/13/17.  Pt would like Dr. Marthann Schiller nurse to call her and discuss having some test done before Dr. Shan Levans. She didn't explain what test she stated Dr. Marthann Schiller assistant would be able to help with that.

## 2017-09-16 NOTE — Telephone Encounter (Signed)
Is it okay to place orders? If so which orders?

## 2017-09-26 MED FILL — IBRANCE 75 MG CAPSULE: 75 | 28 days supply | Qty: 9 | Fill #4

## 2017-09-30 ENCOUNTER — Other Ambulatory Visit: Payer: Self-pay | Admitting: Internal Medicine

## 2017-10-07 ENCOUNTER — Other Ambulatory Visit: Payer: Self-pay | Admitting: Internal Medicine

## 2017-10-07 DIAGNOSIS — H02839 Dermatochalasis of unspecified eye, unspecified eyelid: Secondary | ICD-10-CM | POA: Diagnosis not present

## 2017-10-07 DIAGNOSIS — Z961 Presence of intraocular lens: Secondary | ICD-10-CM | POA: Diagnosis not present

## 2017-10-07 DIAGNOSIS — H2640 Unspecified secondary cataract: Secondary | ICD-10-CM | POA: Diagnosis not present

## 2017-10-10 ENCOUNTER — Inpatient Hospital Stay: Payer: Medicare Other | Attending: Oncology

## 2017-10-10 ENCOUNTER — Inpatient Hospital Stay: Payer: Medicare Other

## 2017-10-10 DIAGNOSIS — Z853 Personal history of malignant neoplasm of breast: Secondary | ICD-10-CM

## 2017-10-10 DIAGNOSIS — C50411 Malignant neoplasm of upper-outer quadrant of right female breast: Secondary | ICD-10-CM | POA: Diagnosis not present

## 2017-10-10 DIAGNOSIS — Z5111 Encounter for antineoplastic chemotherapy: Secondary | ICD-10-CM | POA: Diagnosis not present

## 2017-10-10 DIAGNOSIS — C7951 Secondary malignant neoplasm of bone: Secondary | ICD-10-CM | POA: Diagnosis not present

## 2017-10-10 DIAGNOSIS — C50911 Malignant neoplasm of unspecified site of right female breast: Secondary | ICD-10-CM

## 2017-10-10 LAB — COMPREHENSIVE METABOLIC PANEL
ALBUMIN: 3.9 g/dL (ref 3.5–5.0)
ALT: 13 U/L (ref 0–55)
AST: 19 U/L (ref 5–34)
Alkaline Phosphatase: 87 U/L (ref 40–150)
Anion gap: 7 (ref 3–11)
BUN: 17 mg/dL (ref 7–26)
CHLORIDE: 103 mmol/L (ref 98–109)
CO2: 28 mmol/L (ref 22–29)
CREATININE: 0.82 mg/dL (ref 0.60–1.10)
Calcium: 9.5 mg/dL (ref 8.4–10.4)
GFR calc Af Amer: >60 mL/min (ref >60)
GFR calc non Af Amer: >60 mL/min (ref >60)
Glucose, Bld: 83 mg/dL (ref 70–140)
Potassium: 4.1 mmol/L (ref 3.5–5.1)
SODIUM: 138 mmol/L (ref 136–145)
Total Bilirubin: 0.5 mg/dL (ref 0.2–1.2)
Total Protein: 7.2 g/dL (ref 6.4–8.3)

## 2017-10-10 LAB — CBC WITH DIFFERENTIAL/PLATELET
BASOS ABS: 0 10*3/uL (ref 0.0–0.1)
BASOS PCT: 1 %
EOS ABS: 0.1 10*3/uL (ref 0.0–0.5)
EOS PCT: 4 %
HCT: 38.3 % (ref 34.8–46.6)
Hemoglobin: 13 g/dL (ref 11.6–15.9)
LYMPHS PCT: 35 %
Lymphs Abs: 1.2 10*3/uL (ref 0.9–3.3)
MCH: 35.6 pg — ABNORMAL HIGH (ref 25.1–34.0)
MCHC: 33.9 g/dL (ref 31.5–36.0)
MCV: 104.9 fL — ABNORMAL HIGH (ref 79.5–101.0)
Monocytes Absolute: 0.2 10*3/uL (ref 0.1–0.9)
Monocytes Relative: 6 %
Neutro Abs: 1.8 10*3/uL (ref 1.5–6.5)
Neutrophils Relative %: 54 %
PLATELETS: 199 10*3/uL (ref 145–400)
RBC: 3.65 MIL/uL — ABNORMAL LOW (ref 3.70–5.45)
RDW: 12.6 % (ref 11.2–14.5)
WBC: 3.3 10*3/uL — AB (ref 3.9–10.3)

## 2017-10-10 MED ORDER — FULVESTRANT 250 MG/5ML IM SOLN
INTRAMUSCULAR | Status: AC
Start: 2017-10-10 — End: ?
  Filled 2017-10-10: qty 5

## 2017-10-10 MED ORDER — FULVESTRANT 250 MG/5ML IM SOLN
500.0000 mg | Freq: Once | INTRAMUSCULAR | Status: AC
  Administered 2017-10-10: 500 mg via INTRAMUSCULAR

## 2017-10-10 NOTE — Patient Instructions (Signed)

## 2017-10-11 LAB — CANCER ANTIGEN 27.29: CAN 27.29: 64 U/mL — AB (ref 0.0–38.6)

## 2017-10-14 ENCOUNTER — Ambulatory Visit: Payer: Medicare Other | Admitting: Internal Medicine

## 2017-10-15 ENCOUNTER — Encounter: Payer: Self-pay | Admitting: Allergy and Immunology

## 2017-10-15 ENCOUNTER — Ambulatory Visit (INDEPENDENT_AMBULATORY_CARE_PROVIDER_SITE_OTHER): Payer: Medicare Other | Admitting: Allergy and Immunology

## 2017-10-15 DIAGNOSIS — J310 Chronic rhinitis: Secondary | ICD-10-CM | POA: Diagnosis not present

## 2017-10-15 DIAGNOSIS — Z87898 Personal history of other specified conditions: Secondary | ICD-10-CM | POA: Diagnosis not present

## 2017-10-15 NOTE — Assessment & Plan Note (Signed)
   Nasal steroid sprays will be avoided for now.  Nasal saline spray and/or nasal saline gel is recommended to moisturize nasal mucosa.  The use of a cool-mist humidifier during the night is recommended.  During epistaxis, if needed, oxymetazoline (Afrin) nasal sparay may be applied to a cotton ball to help stanch the blood flow.  If this problem persists or progresses, otolaryngology evaluation may be warranted.

## 2017-10-15 NOTE — Assessment & Plan Note (Signed)
Because of history of epistaxis, discontinue fluticasone nasal spray.  Restart/continue azelastine nasal spray, 1 spray per nostril 1-2 times daily as needed.  Proper nasal spray technique has been discussed and demonstrated.  Nasal saline spray (i.e., Simply Saline) or nasal saline lavage (i.e., NeilMed) is recommended as needed and prior to medicated nasal sprays.

## 2017-10-15 NOTE — Patient Instructions (Signed)
Chronic rhinitis Because of history of epistaxis, discontinue fluticasone nasal spray.  Restart/continue azelastine nasal spray, 1 spray per nostril 1-2 times daily as needed.  Proper nasal spray technique has been discussed and demonstrated.  Nasal saline spray (i.e., Simply Saline) or nasal saline lavage (i.e., NeilMed) is recommended as needed and prior to medicated nasal sprays.  History of epistaxis  Nasal steroid sprays will be avoided for now.  Nasal saline spray and/or nasal saline gel is recommended to moisturize nasal mucosa.  The use of a cool-mist humidifier during the night is recommended.  During epistaxis, if needed, oxymetazoline (Afrin) nasal sparay may be applied to a cotton ball to help stanch the blood flow.  If this problem persists or progresses, otolaryngology evaluation may be warranted.    Return in about 1 year (around 10/16/2018), or if symptoms worsen or fail to improve.

## 2017-10-15 NOTE — Progress Notes (Signed)
Follow-up Note  RE: Erica Young MRN: [id number] DOB: 07-10-1940 Date of Office Visit: 10/15/2017  Primary care provider: Marletta Lor, MD Referring provider: Marletta Lor, MD  History of present illness: Erica Young is a 77 y.o. female with mixed rhinitis presenting today for follow-up.  She was previously seen in this clinic for her initial evaluation on June 18, 2017.  She reports that she had been using fluticasone nasal spray and azelastine nasal spray but discontinued these other medications are outlined in the chart.  Because of epistaxis.  After discontinuing both of these nasal sprays, the epistaxis resolved.  Assessment and plan: Chronic rhinitis Because of history of epistaxis, discontinue fluticasone nasal spray.  Restart/continue azelastine nasal spray, 1 spray per nostril 1-2 times daily as needed.  Proper nasal spray technique has been discussed and demonstrated.  Nasal saline spray (i.e., Simply Saline) or nasal saline lavage (i.e., NeilMed) is recommended as needed and prior to medicated nasal sprays.  History of epistaxis  Nasal steroid sprays will be avoided for now.  Nasal saline spray and/or nasal saline gel is recommended to moisturize nasal mucosa.  The use of a cool-mist humidifier during the night is recommended.  During epistaxis, if needed, oxymetazoline (Afrin) nasal sparay may be applied to a cotton ball to help stanch the blood flow.  If this problem persists or progresses, otolaryngology evaluation may be warranted.    Physical examination: Blood pressure 140/60, pulse 66, resp. rate 18, SpO2 94 %.  General: Alert, interactive, in no acute distress. HEENT: TMs pearly gray, turbinates moderately edematous without discharge, post-pharynx unremarkable. Neck: Supple without lymphadenopathy. Lungs: Clear to auscultation without wheezing, rhonchi or rales. CV: Normal S1, S2 without murmurs. Skin: Warm and dry, without lesions  or rashes.  The following portions of the patient's history were reviewed and updated as appropriate: allergies, current medications, past family history, past medical history, past social history, past surgical history and problem list.  Allergies as of 10/15/2017      Reactions   Ciprofloxacin Anaphylaxis      Medication List        Accurate as of 10/15/17  4:03 PM. Always use your most recent med list.          ALPRAZolam 0.25 MG tablet Commonly known as:  XANAX TAKE 1 TABLET BY MOUTH THREE TIMES DAILY AS NEEDED   aspirin EC 81 MG tablet Take 81 mg by mouth daily after breakfast.   azelastine 0.1 % nasal spray Commonly known as:  ASTELIN Place 1 spray into both nostrils 2 (two) times daily. Use in each nostril as directed   CARTIA XT 120 MG 24 hr capsule Generic drug:  diltiazem TAKE 1 CAPSULE BY MOUTH DAILY   cetirizine 10 MG tablet Commonly known as:  ZYRTEC Take 10 mg by mouth daily.   docusate sodium 100 MG capsule Commonly known as:  COLACE Take 100 mg by mouth daily as needed for mild constipation.   fluticasone 50 MCG/ACT nasal spray Commonly known as:  FLONASE Place 1 spray into both nostrils 2 (two) times daily.   FULVESTRANT IM Inject into the muscle. Takes every 28 days   Glucosamine 500 MG Caps Take 500 mg by mouth daily.   meclizine 25 MG tablet Commonly known as:  ANTIVERT TAKE 1 TABLET(25 MG) BY MOUTH EVERY 6 HOURS AS NEEDED FOR DIZZINESS   NON FORMULARY 2 capsules every morning. IB Guard   OVER THE COUNTER MEDICATION Beno for gas, takes twice  daily   palbociclib 75 MG capsule Commonly known as:  IBRANCE TAKE 1 CAPSULE BY MOUTH THREE TIMES A WEEK. TAKE EVERY MONDAY, WEDNESDAY, AND FRIDAY FOR 3 WEEKS THEN TAKE 1 WEEK OFF   PROBIOTIC DAILY PO Take 1 tablet by mouth daily.   VITAMIN D PO Take 1,000 Units by mouth 2 (two) times daily.       Allergies  Allergen Reactions  . Ciprofloxacin Anaphylaxis    I appreciate the  opportunity to take part in Erica Young's care. Please do not hesitate to contact me with questions.  Sincerely,   R. Edgar Frisk, MD

## 2017-10-16 ENCOUNTER — Other Ambulatory Visit: Payer: Self-pay | Admitting: *Deleted

## 2017-10-16 ENCOUNTER — Telehealth: Payer: Self-pay | Admitting: *Deleted

## 2017-10-16 ENCOUNTER — Ambulatory Visit: Payer: Medicare Other

## 2017-10-16 ENCOUNTER — Inpatient Hospital Stay (HOSPITAL_BASED_OUTPATIENT_CLINIC_OR_DEPARTMENT_OTHER): Payer: Medicare Other | Admitting: Medical

## 2017-10-16 VITALS — BP 131/81 | HR 80 | Temp 98.9°F | Resp 18 | Wt 127.1 lb

## 2017-10-16 DIAGNOSIS — Z5111 Encounter for antineoplastic chemotherapy: Secondary | ICD-10-CM | POA: Diagnosis not present

## 2017-10-16 DIAGNOSIS — C7951 Secondary malignant neoplasm of bone: Secondary | ICD-10-CM

## 2017-10-16 DIAGNOSIS — Z853 Personal history of malignant neoplasm of breast: Secondary | ICD-10-CM

## 2017-10-16 DIAGNOSIS — C50911 Malignant neoplasm of unspecified site of right female breast: Secondary | ICD-10-CM

## 2017-10-16 DIAGNOSIS — C50411 Malignant neoplasm of upper-outer quadrant of right female breast: Secondary | ICD-10-CM

## 2017-10-16 DIAGNOSIS — S60012A Contusion of left thumb without damage to nail, initial encounter: Secondary | ICD-10-CM | POA: Diagnosis not present

## 2017-10-16 DIAGNOSIS — T148XXA Other injury of unspecified body region, initial encounter: Secondary | ICD-10-CM

## 2017-10-16 NOTE — Telephone Encounter (Signed)
This RN received VM from pt per concern with swollen thumb - no known injury and pt on Ibrance.  Per return call - MD able to speak to pt - he requested pt to come in to [facility].  Request sent to scheduling.

## 2017-10-16 NOTE — Progress Notes (Signed)
Pt assessed by PA Lucianne Lei.  No RN assessment performed.

## 2017-10-17 ENCOUNTER — Telehealth: Payer: Self-pay | Admitting: Medical

## 2017-10-17 NOTE — Telephone Encounter (Signed)
NO 6/19 los

## 2017-10-17 NOTE — Progress Notes (Signed)
[facility] Progress Note   Erica Young [id number] December 14, 1940 77 y.o.  Nahima Craigie is managed by Dr. Jana Hakim  Actively treated with chemotherapy/immunotherapy/hormonal therapy: yes  Current Therapy: Faslodex  Last Treated: 10/10/2017 (treatment 33)  Assessment: Plan:    Bruise  Cancer of right breast, stage 4 ([place])   Bruise of the left palmar thumb: The patient was reassured and was told to use cold packs to the area for 20 minutes at a time up to 4 times daily.  She was told to return as needed.  Stage IV breast cancer: The patient continues to be followed by Dr. Jana Hakim and is currently treated with Faslodex.  Her last injection was completed on 10/10/2017.  Please see After Visit Summary for patient specific instructions.  Future Appointments  Date Time Provider [place]  11/07/2017  9:30 AM [facility] 3 [facility] None  11/07/2017 10:00 AM Magrinat, Virgie Dad, MD [facility] None  11/07/2017 10:30 AM [facility] [place] FLUSH [facility] None  12/05/2017  9:30 AM [facility] 1 [facility] None  12/05/2017 10:00 AM [facility] [place] FLUSH [facility] None  12/18/2017  8:00 AM Marletta Lor, MD [facility]    No orders of the defined types were placed in this encounter.      Subjective:   Patient ID:  Erica Young is a 77 y.o. (DOB 26-Feb-1941) female.  Chief Complaint:  Chief Complaint  Patient presents with  . swollen thumb    HPI Erica Young is a 77 year old female with a history of a metastatic breast cancer who is managed by Dr. Jana Hakim and is currently treated with Faslodex.  She presents to the clinic today with a bruise over her left palmar thumb.  She denies trauma or changes in activity.  She has no other areas of bruising and denies any evidence of bleeding.  Her most recent labs from 10/10/2017 returned with a hemoglobin of 13, hematocrit of 38.3, and platelet count of 199,000.  Medications: I have reviewed the patient's  current medications.  Allergies:  Allergies  Allergen Reactions  . Ciprofloxacin Anaphylaxis    Past Medical History:  Diagnosis Date  . Allergy   . Anxiety   . 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 Cancer [facility]) 2007   right breat ca  , lumpectomy and radiation tx (declined chemo and additional prophylactic meds)  . Cataract    both eyes  10/2016 Lt.     04/2017 Rt.   . CEREBROVASCULAR DISEASE 03/03/2010  . Diverticulitis   . DIVERTICULOSIS, COLON 03/03/2010  . Fatty liver 08/02/09   as per U/S done by [facility]  . Foramen ovale    positive bubble study  . GERD (gastroesophageal reflux disease)   . Headache(784.0)    she thinks sinus headaches  . History of cerebral artery stenosis    right middle  . HYPERLIPIDEMIA 03/03/2010  . Hypertension   . HYPOTHYROIDISM 03/03/2010   no longer on meds  . Internal hemorrhoid   . Low back pain   . Mastoiditis    noted on brain MRI  . Rib fracture   . Right leg pain   . Shortness of breath   . Stroke [facility]) Oct. 2011   TIA  . Ulcer     Past Surgical History:  Procedure Laterality Date  . BREAST LUMPECTOMY     right  . CATARACT EXTRACTION Left    10/2016   Rt 04/2017  .  CHOLECYSTECTOMY    . COLONOSCOPY    . POLYPECTOMY     benign cecum  . SHOULDER SURGERY     right shoulder (dislocation)  . TONSILLECTOMY    . TUBAL LIGATION    . UPPER GASTROINTESTINAL ENDOSCOPY    . VIDEO BRONCHOSCOPY WITH ENDOBRONCHIAL ULTRASOUND N/A 01/07/2014   Procedure: VIDEO BRONCHOSCOPY WITH ENDOBRONCHIAL ULTRASOUND;  Surgeon: Ivin Poot, MD;  Location: [facility] OR;  Service: Thoracic;  Laterality: N/A;    Family History  Problem Relation Age of Onset  . Heart disease Sister        cerebral vascular disease also  . Heart disease Brother        cerebral vascular disease also  . Heart disease Brother        cerebral vascular disease  . Heart disease Sister        cebreal vascular  disease also  . Heart disease Sister        cebreal vascular diease also  . Heart disease Sister        cerebral vascular diease also  . Stomach cancer Father   . Colon cancer Neg Hx   . Esophageal cancer Neg Hx   . Rectal cancer Neg Hx   . Colon polyps Neg Hx   . Asthma Neg Hx     Social History   Socioeconomic History  . Marital status: Married    Spouse name: [name]  . Number of children: 2  . Years of education: Not on file  . Highest education level: Not on file  Occupational History  . Occupation: Social Worker--Retired  Social Needs  . Financial resource strain: Not on file  . Food insecurity:    Worry: Not on file    Inability: Not on file  . Transportation needs:    Medical: Not on file    Non-medical: Not on file  Tobacco Use  . Smoking status: Former Smoker    Packs/day: 0.50    Years: 20.00    Pack years: 10.00    Types: Cigarettes    Last attempt to quit: 05/01/1991    Years since quitting: 26.4  . Smokeless tobacco: Never Used  Substance and Sexual Activity  . Alcohol use: No    Alcohol/week: 0.0 oz  . Drug use: No  . Sexual activity: Yes  Lifestyle  . Physical activity:    Days per week: Not on file    Minutes per session: Not on file  . Stress: Not on file  Relationships  . Social connections:    Talks on phone: Not on file    Gets together: Not on file    Attends religious service: Not on file    Active member of club or organization: Not on file    Attends meetings of clubs or organizations: Not on file    Relationship status: Not on file  . Intimate partner violence:    Fear of current or ex partner: Not on file    Emotionally abused: Not on file    Physically abused: Not on file    Forced sexual activity: Not on file  Other Topics Concern  . Not on file  Social History Narrative   Daily caffeine     Past Medical History, Surgical history, Social history, and Family history were reviewed and updated as appropriate.   Please see  review of systems for further details on the patient's review from today.   Review of Systems:  Review of Systems  Gastrointestinal: Negative for blood in stool.  Genitourinary: Negative for hematuria and vaginal bleeding.  Skin: Negative for rash and wound.  Hematological: Bruises/bleeds easily.    Objective:   Physical Exam:  BP 131/81 (BP Location: Left Arm, Patient Position: Sitting)   Pulse 80   Temp 98.9 F (37.2 C) (Oral)   Resp 18   Wt 127 lb 1.6 oz (57.7 kg)   SpO2 98%   BMI 23.25 kg/m  ECOG: 0  Physical Exam  Constitutional: No distress.  Cardiovascular:  Pulses:      Radial pulses are 2+ on the left side.  Left ulnar pulse 2+  Skin: Skin is warm and dry. She is not diaphoretic.  Appropriate over the left palmar thumb.      Lab Review:     Component Value Date/Time   NA 138 10/10/2017 0921   NA 138 04/18/2017 0853   K 4.1 10/10/2017 0921   K 4.4 04/18/2017 0853   CL 103 10/10/2017 0921   CO2 28 10/10/2017 0921   CO2 29 04/18/2017 0853   GLUCOSE 83 10/10/2017 0921   GLUCOSE 95 04/18/2017 0853   BUN 17 10/10/2017 0921   BUN 16.6 04/18/2017 0853   CREATININE 0.82 10/10/2017 0921   CREATININE 0.8 04/18/2017 0853   CALCIUM 9.5 10/10/2017 0921   CALCIUM 9.2 04/18/2017 0853   PROT 7.2 10/10/2017 0921   PROT 7.0 04/18/2017 0853   ALBUMIN 3.9 10/10/2017 0921   ALBUMIN 3.8 04/18/2017 0853   AST 19 10/10/2017 0921   AST 17 04/18/2017 0853   ALT 13 10/10/2017 0921   ALT 16 04/18/2017 0853   ALKPHOS 87 10/10/2017 0921   ALKPHOS 90 04/18/2017 0853   BILITOT 0.5 10/10/2017 0921   BILITOT 0.63 04/18/2017 0853   GFRNONAA >60 10/10/2017 0921   GFRAA >60 10/10/2017 0921       Component Value Date/Time   WBC 3.3 (L) 10/10/2017 0921   RBC 3.65 (L) 10/10/2017 0921   HGB 13.0 10/10/2017 0921   HGB 13.0 04/18/2017 0853   HCT 38.3 10/10/2017 0921   HCT 38.1 04/18/2017 0853   PLT 199 10/10/2017 0921   PLT 159 04/18/2017 0853   MCV 104.9 (H) 10/10/2017  0921   MCV 105.7 (H) 04/18/2017 0853   MCH 35.6 (H) 10/10/2017 0921   MCHC 33.9 10/10/2017 0921   RDW 12.6 10/10/2017 0921   RDW 13.2 04/18/2017 0853   LYMPHSABS 1.2 10/10/2017 0921   LYMPHSABS 1.2 04/18/2017 0853   MONOABS 0.2 10/10/2017 0921   MONOABS 0.2 04/18/2017 0853   EOSABS 0.1 10/10/2017 0921   EOSABS 0.2 04/18/2017 0853   BASOSABS 0.0 10/10/2017 0921   BASOSABS 0.1 04/18/2017 0853   -------------------------------  Imaging from last 24 hours (if applicable):  Radiology interpretation: No results found.

## 2017-10-22 MED FILL — IBRANCE 75 MG CAPSULE: 75 | 28 days supply | Qty: 9 | Fill #5

## 2017-11-06 NOTE — Progress Notes (Signed)
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 She is doing well overall.   She continues on fulvestrant monthly, with a dose due today. She is tolerating this well, however she notes that she has itching all over following the injection. She doesn't take any medications to help with the itching.   Her current palbociclib dose is 75 mg on Mondays Wednesdays and Fridays 3 weeks on and one-week off. She notes that she is tolerating this well. She does have mild nausea that she self-treats with nausea.    REVIEW OF SYSTEMS: Erica Young reports that she doesn't exercise at this time. She notes that she didn't do anything for the 4th of July, but she stayed at home with her husband which she enjoyed. She notes insomnia as well. Patient has hot flashes at night. She denies unusual headaches, visual changes, nausea, vomiting, or dizziness. There has been no unusual cough, phlegm production, or pleurisy. This been no change in bowel or bladder habits. She denies unexplained fatigue or unexplained weight loss, bleeding, rash, or fever. A detailed review of systems was otherwise stable.      BREAST CANCER HISTORY: From the earlier summary  note  Maddy tells me in 2007 while living in [place] she was found to have a very small cancer in the upper-outer quadrant of the right breast, about the size of the P, noted on mammography. It was not palpable. She had a right lumpectomy and full sentinel lymph node sampling. She then received adjuvant radiation, to a total of 33 treatments. She received no systemic therapy.  She then did well until December 2014, when she had a fall and complain of pain in her left ribs. Rib films and chest x-ray on 04/13/2013 showed no fractures, but CT of the abdomen and pelvis on the same day found subcarinal and right hilar adenopathy. This was followed up with a chest CT scan 05/05/2013 confirming extensive mediastinal and right hilar lymphadenopathy, with multiple pulmonary nodules, the largest measuring 0.9 cm. PET scan 05/21/2013 showed hypermetabolic adenopathy in the right and left paratracheal areas, the precarinal, subcarinal and right hilar areas, but no involvement of the liver, and the lung nodules were not hypermetabolic (although they were below the level of reliable detection). In addition, at L5 there was a lucency measuring 1.2 cm.  The PET scan also showed a hypermetabolic focus on the thyroid gland, which was evaluated with neck ultrasound and biopsy 30/12/2328, showing a follicular lesion of undetermined significance (([id number]).  The patient was referred to pulmonary [Dr Melvyn Novas and Dr Julien Nordmann for further evaluation. Repeat PET scan 12/28/2013 showed, in addition to the adenopathy previously noted, now multiple bony metastases. On 01/07/2014 the patient underwent bronchoscopy and this showed ([id number], together with separate cytology] a low-grade mucinous invasive breast cancer, estrogen receptor 100% positive, progesterone receptor 14% positive,  with no HER-2 amplification, the signals ratio being 1.30 and the number per cell 1.95.  The patient was started on anastrozole 01/22/2014; monthly  denosumab/Xgeva was added 07/30/2014. She appeared to tolerate this well, and her CA-27-29 (127 at baseline) normalized. Most recent CT scans of chest abdomen and pelvis 10/03/2015 showed continuing response. Despite this good news however, the patient decided to go off anastrozole and denosumab/Xgeva as of June 2017. By the time she saw Dr. Earlie Server again in September 2017 her tumor marker had doubled. At that time the patient was referred to the breast clinic for a second opinion regarding further evaluation and treatment.   PAST MEDICAL HISTORY: Past Medical History:  Diagnosis Date  . Allergy   . Anxiety   . Bone cancer ([facility]) mri 11/11/14   right parietal bone,left cribriform plate metastases  . Cancer [facility]) 2007   right breat ca  , lumpectomy and radiation tx (declined chemo and additional prophylactic meds)  . Cataract    both eyes  10/2016 Lt.     04/2017 Rt.   . CEREBROVASCULAR DISEASE 03/03/2010  . Diverticulitis   . DIVERTICULOSIS, COLON 03/03/2010  . Fatty liver 08/02/09   as per U/S done by [facility]  . Foramen ovale    positive bubble study  . GERD (gastroesophageal reflux disease)   . Headache(784.0)    she thinks sinus headaches  . History of cerebral artery stenosis    right middle  . HYPERLIPIDEMIA 03/03/2010  . Hypertension   . HYPOTHYROIDISM 03/03/2010   no longer on meds  . Internal hemorrhoid   . Low back pain   . Mastoiditis    noted on brain MRI  . Rib fracture   . Right leg pain   . Shortness of breath   . Stroke [facility]) Oct. 2011   TIA  . Ulcer     PAST SURGICAL HISTORY: Past Surgical History:  Procedure Laterality Date  . BREAST LUMPECTOMY     right  . CATARACT EXTRACTION Left    10/2016   Rt 04/2017  . CHOLECYSTECTOMY    . COLONOSCOPY    . POLYPECTOMY     benign cecum  . SHOULDER SURGERY     right shoulder (dislocation)  . TONSILLECTOMY    . TUBAL LIGATION    . UPPER GASTROINTESTINAL ENDOSCOPY    . VIDEO BRONCHOSCOPY WITH  ENDOBRONCHIAL ULTRASOUND N/A 01/07/2014   Procedure: VIDEO BRONCHOSCOPY WITH ENDOBRONCHIAL ULTRASOUND;  Surgeon: Ivin Poot, MD;  Location: [facility] OR;  Service: Thoracic;  Laterality: N/A;    FAMILY HISTORY Family History  Problem Relation Age of Onset  . Heart disease Sister        cerebral vascular disease also  . Heart disease Brother        cerebral vascular disease also  . Heart disease Brother        cerebral vascular disease  . Heart disease Sister        cebreal vascular disease also  . Heart disease Sister        cebreal vascular diease also  . Heart disease Sister        cerebral vascular diease also  . Stomach cancer Father   . Colon cancer Neg Hx   . Esophageal cancer Neg Hx   . Rectal cancer Neg Hx   . Colon polyps Neg Hx   . Asthma Neg Hx   The patient's father died from stomach cancer in his late 31s. The patient's mother  died in her 22s from a stroke. The patient has 2 brothers, 4 sisters. One sister had leukemia. There is no history of breast, colon, or ovarian cancer in the family to her knowledge  GYNECOLOGIC HISTORY:  No LMP recorded. Patient is postmenopausal. Menarche age 60, first live birth age 21, the patient is [place] P2. She went through menopause in her late 63s. She took no hormone replacement. She took oral contraceptives for approximately 2 years remotely without complications.  SOCIAL HISTORY:  Tityana is originally from [place], [place]. She worked as a Education officer, museum, particularly, in the field of substance abuse. Her husband, Erica Young, is a retired substance abuse Social worker. This is a second marriage for both of them. Breane has 2 children from her first marriage, [name], lives in [place], and worked as a Audiological scientist but is now disabled, and Lawrence Santiago, who lives in [place] and works in Engineer, mining. The patient has 6 grandchildren. She attends [employer]Mikki Santee has 3 children from his first marriage. [name] Junior lives in [place] and has his own trucking business. He tells me he is estranged from his 2 daughters Amedeo Gory (a retired Pharmacist, hospital) and Salena Saner (who works in a bank). They are living on [place].   ADVANCED DIRECTIVES: in place   HEALTH MAINTENANCE: Social History   Tobacco Use  . Smoking status: Former Smoker    Packs/day: 0.50    Years: 20.00    Pack years: 10.00    Types: Cigarettes    Last attempt to quit: 05/01/1991    Years since quitting: 26.5  . Smokeless tobacco: Never Used  Substance Use Topics  . Alcohol use: No    Alcohol/week: 0.0 oz  . Drug use: No     Colonoscopy:  PAP:  Bone density:   Allergies  Allergen Reactions  . Ciprofloxacin Anaphylaxis    Current Outpatient Medications  Medication Sig Dispense Refill  . ALPRAZolam (XANAX) 0.25 MG tablet TAKE 1 TABLET BY MOUTH THREE TIMES DAILY AS NEEDED 90 tablet 0  . aspirin EC 81 MG tablet Take 81 mg by mouth daily after breakfast.     . azelastine (ASTELIN) 0.1 % nasal spray Place 1 spray into both nostrils 2 (two) times daily. Use in each nostril as directed (Patient not taking: Reported on 08/29/2017) 30 mL 5  . CARTIA XT 120 MG 24 hr capsule TAKE 1 CAPSULE BY MOUTH DAILY 90 capsule 0  . cetirizine (ZYRTEC) 10 MG tablet Take 10 mg by mouth daily.    . Cholecalciferol (VITAMIN D PO) Take 1,000 Units by mouth 2 (two) times daily.    Marland Kitchen docusate sodium (COLACE) 100 MG capsule Take 100 mg by mouth daily as needed for mild constipation.    . fluticasone (FLONASE) 50 MCG/ACT nasal spray Place 1 spray into both nostrils 2 (two) times daily. (Patient not taking: Reported on 08/29/2017) 18.2 g 5  . FULVESTRANT IM Inject into the muscle. Takes every 28 days    . Glucosamine 500 MG CAPS Take 500 mg by mouth daily.     . meclizine (ANTIVERT) 25 MG tablet TAKE 1 TABLET(25 MG) BY MOUTH EVERY 6 HOURS AS NEEDED FOR DIZZINESS 60 tablet 0  . NON FORMULARY 2 capsules every morning. IB Guard    . OVER THE COUNTER MEDICATION Beno for gas, takes  twice daily    . palbociclib (IBRANCE) 75 MG capsule TAKE 1 CAPSULE BY MOUTH THREE TIMES A WEEK. TAKE EVERY MONDAY, WEDNESDAY, AND  FRIDAY FOR 3 WEEKS THEN TAKE 1 WEEK OFF 9 capsule 6  . Probiotic Product (PROBIOTIC DAILY PO) Take 1 tablet by mouth daily.      Current Facility-Administered Medications  Medication Dose Route Frequency Provider Last Rate Last Dose  . 0.9 %  sodium chloride infusion  500 mL Intravenous Once Erica Young, Erica Raspberry, MD        OBJECTIVE: Middle-aged Latin American woman who appears stated age  77:   11/07/17 0953  BP: 135/74  Pulse: 71  Resp: 18  Temp: 99 F (37.2 C)  SpO2: 99%     Body mass index is 23.28 kg/m.    ECOG FS:1 - Symptomatic but completely ambulatory  Sclerae unicteric, pupils round and equal Oropharynx clear and moist No cervical or supraclavicular adenopathy Lungs no rales or rhonchi Heart regular rate and rhythm Abd soft, nontender, positive bowel sounds MSK no focal spinal tenderness, no upper extremity lymphedema Neuro: nonfocal, well oriented, appropriate affect Breasts: The right breast has undergone lumpectomy followed by radiation with no evidence of local recurrence.  Left breast is benign.  Both axillae are benign.  LAB RESULTS:  CMP     Component Value Date/Time   NA 137 11/07/2017 0930   NA 138 04/18/2017 0853   K 4.0 11/07/2017 0930   K 4.4 04/18/2017 0853   CL 103 11/07/2017 0930   CO2 27 11/07/2017 0930   CO2 29 04/18/2017 0853   GLUCOSE 99 11/07/2017 0930   GLUCOSE 95 04/18/2017 0853   BUN 17 11/07/2017 0930   BUN 16.6 04/18/2017 0853   CREATININE 0.83 11/07/2017 0930   CREATININE 0.8 04/18/2017 0853   CALCIUM 9.1 11/07/2017 0930   CALCIUM 9.2 04/18/2017 0853   PROT 6.9 11/07/2017 0930   PROT 7.0 04/18/2017 0853   ALBUMIN 3.8 11/07/2017 0930   ALBUMIN 3.8 04/18/2017 0853   AST 15 11/07/2017 0930   AST 17 04/18/2017 0853   ALT 13 11/07/2017 0930   ALT 16 04/18/2017 0853   ALKPHOS 83 11/07/2017 0930     ALKPHOS 90 04/18/2017 0853   BILITOT 0.5 11/07/2017 0930   BILITOT 0.63 04/18/2017 0853   GFRNONAA >60 11/07/2017 0930   GFRAA >60 11/07/2017 0930    INo results found for: SPEP, UPEP  Lab Results  Component Value Date   WBC 3.6 (L) 11/07/2017   NEUTROABS 2.1 11/07/2017   HGB 12.5 11/07/2017   HCT 36.6 11/07/2017   MCV 104.3 (H) 11/07/2017   PLT 198 11/07/2017      Chemistry      Component Value Date/Time   NA 137 11/07/2017 0930   NA 138 04/18/2017 0853   K 4.0 11/07/2017 0930   K 4.4 04/18/2017 0853   CL 103 11/07/2017 0930   CO2 27 11/07/2017 0930   CO2 29 04/18/2017 0853   BUN 17 11/07/2017 0930   BUN 16.6 04/18/2017 0853   CREATININE 0.83 11/07/2017 0930   CREATININE 0.8 04/18/2017 0853      Component Value Date/Time   CALCIUM 9.1 11/07/2017 0930   CALCIUM 9.2 04/18/2017 0853   ALKPHOS 83 11/07/2017 0930   ALKPHOS 90 04/18/2017 0853   AST 15 11/07/2017 0930   AST 17 04/18/2017 0853   ALT 13 11/07/2017 0930   ALT 16 04/18/2017 0853   BILITOT 0.5 11/07/2017 0930   BILITOT 0.63 04/18/2017 0853      Most recent CA-27-29 had risen to 57.6.  This is being repeated today  No components found for:  [id number]  No results for input(s): INR in the last 168 hours.  Urinalysis    Component Value Date/Time   COLORURINE STRAW (A) 11/19/2016 1750   APPEARANCEUR CLEAR 11/19/2016 1750   LABSPEC 1.003 (L) 11/19/2016 1750   PHURINE 7.0 11/19/2016 1750   GLUCOSEU NEGATIVE 11/19/2016 1750   HGBUR NEGATIVE 11/19/2016 1750   BILIRUBINUR NEGATIVE 11/19/2016 1750   BILIRUBINUR neg 02/16/2015 1318   KETONESUR NEGATIVE 11/19/2016 1750   PROTEINUR NEGATIVE 11/19/2016 1750   UROBILINOGEN 0.2 02/16/2015 1318   UROBILINOGEN 0.2 11/18/2013 1321   NITRITE NEGATIVE 11/19/2016 1750   LEUKOCYTESUR TRACE (A) 11/19/2016 1750     STUDIES: No results found.   ELIGIBLE FOR AVAILABLE RESEARCH PROTOCOL: no  ASSESSMENT: 77 y.o. [place] woman  (1) status post right breast  upper outer quadrant lumpectomy and axillary lymph node dissection in 2007 for what appears to have been a T1 N0, stage IA invasive ductal carcinoma, treated adjuvantly with radiation (33 sessions)  METASTATIC DISEASE definitively documented Sept 2015 (2) bronchoscopic biopsy 01/07/2014 showed a low-grade mucinous breast cancer, strongly estrogen receptor positive, progesterone receptor positive, HER-2 negative; staging studies confirmed extensive hypermetabolic adenopathy, multiple bone lesions, likely early lung involvement, but no liver lesions.  (a) bone scan and chest CT 07/09/2016 shows stable disease.  (b) CA-27-29 is moderately informative  (c) bone scan and CT scan of the chest 04/24/2017 shows essentially stable disease  (3) on Arimidex between September 2015 and June 2017, with evidence of response; discontinued secondary to side effects  (a) bone density 04/10/2016 shows osteopenia with a T score of -2.1  (4) on monthly denosumab/Xgeva between 07/30/2014 and 10/04/2015, discontinued due to patient's concerns regarding osteonecrosis of the jaw  (a) discussed again October 2017, the patient adamantly refusing denosumab  (5) started fulvestrant 01/26/2016    (a) Palbociclib added at 75 mg M/W/F, beginning mid February 2018  PLAN  Thaily is just about 4 years out from definitive diagnosis of metastatic breast cancer, with no clinical evidence of disease activity.  This is very favorable.  Her tumor markers have been inching upwards.  Previously she had refused testing but now she really would like to have restaging studies and I am setting her up for a bone scan and CT scan of the chest before her next visit with me which will be in 4 weeks.  If there has been disease progression we can consider switching to a different aromatase inhibitor and a different CDK for 6 inhibitor.  There are of course many other options.  She knows to call for any other problems that may develop before  the next visit. Cecilee Rosner, Erica Dad, MD  11/07/17 10:24 AM Medical Oncology and Hematology [facility] [address] [place], [place] [address] Tel. [phone]    Fax. [phone]    I, Soijett Blue am acting as scribe for Dr. Sarajane Jews C. Jae Bruck.  I, Lurline Del MD, have reviewed the above documentation for accuracy and completeness, and I agree with the above.

## 2017-11-07 ENCOUNTER — Inpatient Hospital Stay: Payer: Medicare Other | Attending: Oncology

## 2017-11-07 ENCOUNTER — Telehealth: Payer: Self-pay | Admitting: Oncology

## 2017-11-07 ENCOUNTER — Ambulatory Visit: Payer: Medicare Other

## 2017-11-07 ENCOUNTER — Inpatient Hospital Stay: Payer: Medicare Other

## 2017-11-07 ENCOUNTER — Inpatient Hospital Stay (HOSPITAL_BASED_OUTPATIENT_CLINIC_OR_DEPARTMENT_OTHER): Payer: Medicare Other | Admitting: Oncology

## 2017-11-07 VITALS — BP 135/74 | HR 71 | Temp 99.0°F | Resp 18 | Ht 62.0 in | Wt 127.3 lb

## 2017-11-07 DIAGNOSIS — Z170 Estrogen receptor positive status [ER+]: Secondary | ICD-10-CM

## 2017-11-07 DIAGNOSIS — Z87891 Personal history of nicotine dependence: Secondary | ICD-10-CM | POA: Diagnosis not present

## 2017-11-07 DIAGNOSIS — C50911 Malignant neoplasm of unspecified site of right female breast: Secondary | ICD-10-CM

## 2017-11-07 DIAGNOSIS — C50411 Malignant neoplasm of upper-outer quadrant of right female breast: Secondary | ICD-10-CM

## 2017-11-07 DIAGNOSIS — C7951 Secondary malignant neoplasm of bone: Secondary | ICD-10-CM | POA: Diagnosis not present

## 2017-11-07 DIAGNOSIS — R978 Other abnormal tumor markers: Secondary | ICD-10-CM | POA: Diagnosis not present

## 2017-11-07 DIAGNOSIS — N951 Menopausal and female climacteric states: Secondary | ICD-10-CM | POA: Diagnosis not present

## 2017-11-07 DIAGNOSIS — G4700 Insomnia, unspecified: Secondary | ICD-10-CM

## 2017-11-07 DIAGNOSIS — C773 Secondary and unspecified malignant neoplasm of axilla and upper limb lymph nodes: Secondary | ICD-10-CM | POA: Insufficient documentation

## 2017-11-07 DIAGNOSIS — M25449 Effusion, unspecified hand: Secondary | ICD-10-CM | POA: Diagnosis not present

## 2017-11-07 DIAGNOSIS — Z5111 Encounter for antineoplastic chemotherapy: Secondary | ICD-10-CM | POA: Diagnosis not present

## 2017-11-07 DIAGNOSIS — Z853 Personal history of malignant neoplasm of breast: Secondary | ICD-10-CM

## 2017-11-07 DIAGNOSIS — M8580 Other specified disorders of bone density and structure, unspecified site: Secondary | ICD-10-CM | POA: Insufficient documentation

## 2017-11-07 LAB — COMPREHENSIVE METABOLIC PANEL
ALBUMIN: 3.8 g/dL (ref 3.5–5.0)
ALK PHOS: 83 U/L (ref 38–126)
ALT: 13 U/L (ref 0–44)
AST: 15 U/L (ref 15–41)
Anion gap: 7 (ref 5–15)
BUN: 17 mg/dL (ref 8–23)
CALCIUM: 9.1 mg/dL (ref 8.9–10.3)
CO2: 27 mmol/L (ref 22–32)
Chloride: 103 mmol/L (ref 98–111)
Creatinine, Ser: 0.83 mg/dL (ref 0.44–1.00)
GFR calc Af Amer: >60 mL/min (ref >60)
GFR calc non Af Amer: >60 mL/min (ref >60)
GLUCOSE: 99 mg/dL (ref 70–99)
POTASSIUM: 4 mmol/L (ref 3.5–5.1)
SODIUM: 137 mmol/L (ref 135–145)
Total Bilirubin: 0.5 mg/dL (ref 0.3–1.2)
Total Protein: 6.9 g/dL (ref 6.5–8.1)

## 2017-11-07 LAB — CBC WITH DIFFERENTIAL/PLATELET
BASOS ABS: 0 10*3/uL (ref 0.0–0.1)
BASOS PCT: 1 %
Eosinophils Absolute: 0.1 10*3/uL (ref 0.0–0.5)
Eosinophils Relative: 3 %
HEMATOCRIT: 36.6 % (ref 34.8–46.6)
HEMOGLOBIN: 12.5 g/dL (ref 11.6–15.9)
Lymphocytes Relative: 34 %
Lymphs Abs: 1.2 10*3/uL (ref 0.9–3.3)
MCH: 35.6 pg — ABNORMAL HIGH (ref 25.1–34.0)
MCHC: 34.2 g/dL (ref 31.5–36.0)
MCV: 104.3 fL — ABNORMAL HIGH (ref 79.5–101.0)
MONO ABS: 0.2 10*3/uL (ref 0.1–0.9)
Monocytes Relative: 5 %
NEUTROS ABS: 2.1 10*3/uL (ref 1.5–6.5)
NEUTROS PCT: 57 %
Platelets: 198 10*3/uL (ref 145–400)
RBC: 3.51 MIL/uL — ABNORMAL LOW (ref 3.70–5.45)
RDW: 12.7 % (ref 11.2–14.5)
WBC: 3.6 10*3/uL — ABNORMAL LOW (ref 3.9–10.3)

## 2017-11-07 MED ORDER — FULVESTRANT 250 MG/5ML IM SOLN
500.0000 mg | Freq: Once | INTRAMUSCULAR | Status: AC
  Administered 2017-11-07: 500 mg via INTRAMUSCULAR

## 2017-11-07 MED ORDER — FULVESTRANT 250 MG/5ML IM SOLN
INTRAMUSCULAR | Status: AC
  Filled 2017-11-07: qty 10

## 2017-11-07 NOTE — Patient Instructions (Signed)

## 2017-11-07 NOTE — Telephone Encounter (Signed)
Gave patient avs and calendar of upcoming aug appts.  °

## 2017-11-08 LAB — CANCER ANTIGEN 27.29: CAN 27.29: 61.1 U/mL — AB (ref 0.0–38.6)

## 2017-11-11 ENCOUNTER — Other Ambulatory Visit: Payer: Self-pay | Admitting: Internal Medicine

## 2017-11-12 ENCOUNTER — Encounter: Payer: Self-pay | Admitting: Internal Medicine

## 2017-11-12 ENCOUNTER — Ambulatory Visit (INDEPENDENT_AMBULATORY_CARE_PROVIDER_SITE_OTHER): Payer: Medicare Other | Admitting: Internal Medicine

## 2017-11-12 VITALS — BP 130/70

## 2017-11-12 DIAGNOSIS — I10 Essential (primary) hypertension: Secondary | ICD-10-CM

## 2017-11-12 DIAGNOSIS — C50911 Malignant neoplasm of unspecified site of right female breast: Secondary | ICD-10-CM

## 2017-11-12 DIAGNOSIS — E785 Hyperlipidemia, unspecified: Secondary | ICD-10-CM | POA: Diagnosis not present

## 2017-11-12 DIAGNOSIS — C7951 Secondary malignant neoplasm of bone: Secondary | ICD-10-CM

## 2017-11-12 MED ORDER — ALPRAZOLAM 0.25 MG PO TABS: 0.2500 mg | ORAL_TABLET | Freq: Three times a day (TID) | ORAL | 0 refills | Status: DC | PRN

## 2017-11-12 NOTE — Patient Instructions (Addendum)
Follow-up with Dr. Jana Hakim  Call or return to clinic prn if these symptoms worsen or fail to improve as anticipated.  Continue aspirin therapy

## 2017-11-12 NOTE — Progress Notes (Signed)
Subjective:    Patient ID: Erica Young, female    DOB: April 28, 1941, 77 y.o.   MRN: [id number]  HPI  77 year old patient who is followed closely oncology with history of metastatic breast cancer with bony metastases. She presents primarily for the need of a medication refill on alprazolam. For the past 2 days she has had pain and swelling involving her right index finger.  2 months ago she had pain and discoloration involving her left thumb that resolved after a few days.  She is requesting laboratory studies that are not checked routinely by oncology  Past Medical History:  Diagnosis Date  . Allergy   . Anxiety   . Bone cancer ([place]) mri 11/11/14   right parietal bone,left cribriform plate metastases  . Cancer [facility]) 2007   right breat ca  , lumpectomy and radiation tx (declined chemo and additional prophylactic meds)  . Cataract    both eyes  10/2016 Lt.     04/2017 Rt.   . CEREBROVASCULAR DISEASE 03/03/2010  . Diverticulitis   . DIVERTICULOSIS, COLON 03/03/2010  . Fatty liver 08/02/09   as per U/S done by [facility]  . Foramen ovale    positive bubble study  . GERD (gastroesophageal reflux disease)   . Headache(784.0)    she thinks sinus headaches  . History of cerebral artery stenosis    right middle  . HYPERLIPIDEMIA 03/03/2010  . Hypertension   . HYPOTHYROIDISM 03/03/2010   no longer on meds  . Internal hemorrhoid   . Low back pain   . Mastoiditis    noted on brain MRI  . Rib fracture   . Right leg pain   . Shortness of breath   . Stroke [facility]) Oct. 2011   TIA  . Ulcer      Social History   Socioeconomic History  . Marital status: Married    Spouse name: [name]  . Number of children: 2  . Years of education: Not on file  . Highest education level: Not on file  Occupational History  . Occupation: Social Worker--Retired  Social Needs  . Financial resource strain: Not on file  . Food insecurity:    Worry: Not on file    Inability: Not on file  .  Transportation needs:    Medical: Not on file    Non-medical: Not on file  Tobacco Use  . Smoking status: Former Smoker    Packs/day: 0.50    Years: 20.00    Pack years: 10.00    Types: Cigarettes    Last attempt to quit: 05/01/1991    Years since quitting: 26.5  . Smokeless tobacco: Never Used  Substance and Sexual Activity  . Alcohol use: No    Alcohol/week: 0.0 oz  . Drug use: No  . Sexual activity: Yes  Lifestyle  . Physical activity:    Days per week: Not on file    Minutes per session: Not on file  . Stress: Not on file  Relationships  . Social connections:    Talks on phone: Not on file    Gets together: Not on file    Attends religious service: Not on file    Active member of club or organization: Not on file    Attends meetings of clubs or organizations: Not on file    Relationship status: Not on file  . Intimate partner violence:    Fear of current or ex partner: Not on file    Emotionally abused: Not  on file    Physically abused: Not on file    Forced sexual activity: Not on file  Other Topics Concern  . Not on file  Social History Narrative   Daily caffeine     Past Surgical History:  Procedure Laterality Date  . BREAST LUMPECTOMY     right  . CATARACT EXTRACTION Left    10/2016   Rt 04/2017  . CHOLECYSTECTOMY    . COLONOSCOPY    . POLYPECTOMY     benign cecum  . SHOULDER SURGERY     right shoulder (dislocation)  . TONSILLECTOMY    . TUBAL LIGATION    . UPPER GASTROINTESTINAL ENDOSCOPY    . VIDEO BRONCHOSCOPY WITH ENDOBRONCHIAL ULTRASOUND N/A 01/07/2014   Procedure: VIDEO BRONCHOSCOPY WITH ENDOBRONCHIAL ULTRASOUND;  Surgeon: Ivin Poot, MD;  Location: [facility] OR;  Service: Thoracic;  Laterality: N/A;    Family History  Problem Relation Age of Onset  . Heart disease Sister        cerebral vascular disease also  . Heart disease Brother        cerebral vascular disease also  . Heart disease Brother        cerebral vascular disease  . Heart  disease Sister        cebreal vascular disease also  . Heart disease Sister        cebreal vascular diease also  . Heart disease Sister        cerebral vascular diease also  . Stomach cancer Father   . Colon cancer Neg Hx   . Esophageal cancer Neg Hx   . Rectal cancer Neg Hx   . Colon polyps Neg Hx   . Asthma Neg Hx     Allergies  Allergen Reactions  . Ciprofloxacin Anaphylaxis    Current Outpatient Medications on File Prior to Visit  Medication Sig Dispense Refill  . aspirin EC 81 MG tablet Take 81 mg by mouth daily after breakfast.     . azelastine (ASTELIN) 0.1 % nasal spray Place 1 spray into both nostrils 2 (two) times daily. Use in each nostril as directed (Patient not taking: Reported on 08/29/2017) 30 mL 5  . CARTIA XT 120 MG 24 hr capsule TAKE 1 CAPSULE BY MOUTH DAILY 90 capsule 0  . cetirizine (ZYRTEC) 10 MG tablet Take 10 mg by mouth daily.    . Cholecalciferol (VITAMIN D PO) Take 1,000 Units by mouth 2 (two) times daily.    Marland Kitchen docusate sodium (COLACE) 100 MG capsule Take 100 mg by mouth daily as needed for mild constipation.    . fluticasone (FLONASE) 50 MCG/ACT nasal spray Place 1 spray into both nostrils 2 (two) times daily. (Patient not taking: Reported on 08/29/2017) 18.2 g 5  . FULVESTRANT IM Inject into the muscle. Takes every 28 days    . Glucosamine 500 MG CAPS Take 500 mg by mouth daily.     . meclizine (ANTIVERT) 25 MG tablet TAKE 1 TABLET(25 MG) BY MOUTH EVERY 6 HOURS AS NEEDED FOR DIZZINESS 60 tablet 0  . NON FORMULARY 2 capsules every morning. IB Guard    . OVER THE COUNTER MEDICATION Beno for gas, takes twice daily    . palbociclib (IBRANCE) 75 MG capsule TAKE 1 CAPSULE BY MOUTH THREE TIMES A WEEK. TAKE EVERY MONDAY, WEDNESDAY, AND FRIDAY FOR 3 WEEKS THEN TAKE 1 WEEK OFF 9 capsule 6  . Probiotic Product (PROBIOTIC DAILY PO) Take 1 tablet by mouth daily.  Current Facility-Administered Medications on File Prior to Visit  Medication Dose Route Frequency  Provider Last Rate Last Dose  . 0.9 %  sodium chloride infusion  500 mL Intravenous Once Armbruster, Carlota Raspberry, MD        BP 130/70      Review of Systems  Constitutional: Positive for activity change and appetite change.  HENT: Negative for congestion, dental problem, hearing loss, rhinorrhea, sinus pressure, sore throat and tinnitus.   Eyes: Negative for pain, discharge and visual disturbance.  Respiratory: Negative for cough and shortness of breath.   Cardiovascular: Negative for chest pain, palpitations and leg swelling.  Gastrointestinal: Negative for abdominal distention, abdominal pain, blood in stool, constipation, diarrhea, nausea and vomiting.  Genitourinary: Negative for difficulty urinating, dysuria, flank pain, frequency, hematuria, pelvic pain, urgency, vaginal bleeding, vaginal discharge and vaginal pain.  Musculoskeletal: Negative for arthralgias, gait problem and joint swelling.  Skin: Negative for rash.       Swollen painful right index finger  Neurological: Negative for dizziness, syncope, speech difficulty, weakness, numbness and headaches.  Hematological: Negative for adenopathy.  Psychiatric/Behavioral: Positive for sleep disturbance. Negative for agitation, behavioral problems and dysphoric mood. The patient is nervous/anxious.        Objective:   Physical Exam  Constitutional: She appears well-developed and well-nourished. No distress.  Blood pressure 130/70 Anxious  Skin:  The right index finger distal to the PIP joint is swollen tender and slightly red.  Slightly warm to touch Radial and ulnar pulses are full          Assessment & Plan:   Inflammatory changes right index finger.  Most consistent with a cellulitis but patient is convinced this is chemotherapy related.  It was suggested empiric antibiotic therapy to cover for a possible cellulitis but she states that she had a very similar problem with the left thumb 2 months ago that resolved with  elevation and ice.  She declined a pain medication stating that she had medicine available at home Anxiety disorder/insomnia.  Alprazolam refilled  Oncology follow-up urged Check updated lab per patient request  Marletta Lor

## 2017-11-12 NOTE — Telephone Encounter (Signed)
Copied from [place] [id number]. Topic: Quick Communication - Rx Refill/Question >> Nov 12, 2017 12:01 PM Oliver Pila B wrote: Medication: ALPRAZolam Duanne Moron) 0.25 MG tablet [[id number]]   Has the patient contacted their pharmacy? Yes.   (Agent: If no, request that the patient contact the pharmacy for the refill.) (Agent: If yes, when and what did the pharmacy advise?)  Preferred Pharmacy (with phone number or street name): [facility]  Agent: Please be advised that RX refills may take up to 3 business days. We ask that you follow-up with your pharmacy.

## 2017-11-13 LAB — TSH: TSH: 2.83 u[IU]/mL (ref 0.35–4.50)

## 2017-11-13 LAB — LIPID PANEL
CHOL/HDL RATIO: 2
Cholesterol: 177 mg/dL (ref 0–200)
HDL: 71.4 mg/dL (ref >39.00)
LDL Cholesterol: 86 mg/dL (ref 0–99)
NONHDL: 105.27
Triglycerides: 95 mg/dL (ref 0.0–149.0)
VLDL: 19 mg/dL (ref 0.0–40.0)

## 2017-11-18 ENCOUNTER — Other Ambulatory Visit: Payer: Self-pay | Admitting: Internal Medicine

## 2017-11-18 ENCOUNTER — Ambulatory Visit: Payer: 59 | Admitting: Allergy and Immunology

## 2017-11-19 MED FILL — IBRANCE 75 MG CAPSULE: 75 | 28 days supply | Qty: 9 | Fill #6

## 2017-11-19 NOTE — Progress Notes (Signed)
I, Erica Del MD, have reviewed the above documentation for accuracy and completeness, and I agree with the above.  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 She continues on palbociclib, currently at 75 mg on Mondays, Wednesdays and Fridays for 3 week on and 7 days off. She tolerates this well.    She receives monthly fulvestrant with her most recent dose on 11/07/2017. She also tolerates this well. She denies any pain after the injection.   She is scheduled for restaging studies with a chest CT and bone scan on 12/02/2017.    REVIEW OF SYSTEMS: Serai reports that her right index finger has been stiff and painful since 11/10/2017. The finger turned purple and black which extended into the base of the finger and hand. This finger sometimes has itching. She had similar issues with her left thumb starting 10/14/2017. She aided her finger pain with two Alive, but she prefers not to take pain medication. She denies any trauma to these fingers, or being evaluated for RA.   She denies unusual headaches, visual changes, nausea, vomiting, or dizziness. There has been no unusual cough, phlegm production, or pleurisy. This been no change in bowel or  bladder habits. She denies unexplained fatigue or unexplained weight loss, bleeding, rash, or fever. A detailed review of systems was otherwise stable.      BREAST CANCER HISTORY: From the earlier summary note  Erica Young tells me in 2007 while living in [place] she was found to have a very small cancer in the upper-outer quadrant of the right breast, about the size of the P, noted on mammography. It was not palpable. She had a right lumpectomy and full sentinel lymph node sampling. She then received adjuvant radiation, to a total of 33 treatments. She received no systemic therapy.  She then did well until December 2014, when she had a fall and complain of pain in her left ribs. Rib films and chest x-ray on 04/13/2013 showed no fractures, but CT of the abdomen and pelvis on the same day found subcarinal and right hilar adenopathy. This was followed up with a chest CT scan 05/05/2013 confirming extensive mediastinal and right hilar lymphadenopathy, with multiple pulmonary nodules, the largest measuring 0.9 cm. PET scan 05/21/2013 showed hypermetabolic adenopathy in the right and left paratracheal areas, the precarinal, subcarinal and right hilar areas, but no involvement of the liver, and the lung nodules were not hypermetabolic (although they were below the level of reliable detection). In addition, at L5 there was a lucency measuring 1.2 cm.  The PET scan also showed a hypermetabolic focus on the thyroid gland, which was evaluated with neck ultrasound and biopsy 65/99/3570, showing a follicular lesion of undetermined significance (([id number]).  The patient was referred to pulmonary [Dr Melvyn Novas and Dr Julien Nordmann for further evaluation. Repeat PET scan 12/28/2013 showed, in addition to the  adenopathy previously noted, now multiple bony metastases. On 01/07/2014 the patient underwent bronchoscopy and this showed ([id number], together with separate cytology] a low-grade mucinous invasive breast cancer, estrogen  receptor 100% positive, progesterone receptor 14% positive, with no HER-2 amplification, the signals ratio being 1.30 and the number per cell 1.95.  The patient was started on anastrozole 01/22/2014; monthly denosumab/Xgeva was added 07/30/2014. She appeared to tolerate this well, and her CA-27-29 (127 at baseline) normalized. Most recent CT scans of chest abdomen and pelvis 10/03/2015 showed continuing response. Despite this good news however, the patient decided to go off anastrozole and denosumab/Xgeva as of June 2017. By the time she saw Dr. Earlie Server again in September 2017 her tumor marker had doubled. At that time the patient was referred to the breast clinic for a second opinion regarding further evaluation and treatment.   PAST MEDICAL HISTORY: Past Medical History:  Diagnosis Date  . Allergy   . Anxiety   . Bone cancer ([place]) mri 11/11/14   right parietal bone,left cribriform plate metastases  . Cancer [facility]) 2007   right breat ca  , lumpectomy and radiation tx (declined chemo and additional prophylactic meds)  . Cataract    both eyes  10/2016 Lt.     04/2017 Rt.   . CEREBROVASCULAR DISEASE 03/03/2010  . Diverticulitis   . DIVERTICULOSIS, COLON 03/03/2010  . Fatty liver 08/02/09   as per U/S done by [facility]  . Foramen ovale    positive bubble study  . GERD (gastroesophageal reflux disease)   . Headache(784.0)    she thinks sinus headaches  . History of cerebral artery stenosis    right middle  . HYPERLIPIDEMIA 03/03/2010  . Hypertension   . HYPOTHYROIDISM 03/03/2010   no longer on meds  . Internal hemorrhoid   . Low back pain   . Mastoiditis    noted on brain MRI  . Rib fracture   . Right leg pain   . Shortness of breath   . Stroke [facility]) Oct. 2011   TIA  . Ulcer     PAST SURGICAL HISTORY: Past Surgical History:  Procedure Laterality Date  . BREAST LUMPECTOMY     right  . CATARACT EXTRACTION Left    10/2016   Rt 04/2017  . CHOLECYSTECTOMY    .  COLONOSCOPY    . POLYPECTOMY     benign cecum  . SHOULDER SURGERY     right shoulder (dislocation)  . TONSILLECTOMY    . TUBAL LIGATION    . UPPER GASTROINTESTINAL ENDOSCOPY    . VIDEO BRONCHOSCOPY WITH ENDOBRONCHIAL ULTRASOUND N/A 01/07/2014   Procedure: VIDEO BRONCHOSCOPY WITH ENDOBRONCHIAL ULTRASOUND;  Surgeon: Ivin Poot, MD;  Location: [facility] OR;  Service: Thoracic;  Laterality: N/A;    FAMILY HISTORY Family History  Problem Relation Age of Onset  . Heart disease Sister        cerebral vascular disease also  . Heart disease Brother        cerebral vascular disease also  . Heart disease Brother        cerebral vascular disease  . Heart disease Sister        cebreal vascular disease also  . Heart disease Sister        cebreal vascular diease also  . Heart disease Sister        cerebral vascular diease also  . Stomach cancer Father   . Colon cancer Neg Hx   . Esophageal cancer Neg Hx   .  Rectal cancer Neg Hx   . Colon polyps Neg Hx   . Asthma Neg Hx   The patient's father died from stomach cancer in his late 8s. The patient's mother died in her 30s from a stroke. The patient has 2 brothers, 4 sisters. One sister had leukemia. There is no history of breast, colon, or ovarian cancer in the family to her knowledge  GYNECOLOGIC HISTORY:  No LMP recorded. Patient is postmenopausal. Menarche age 62, first live birth age 28, the patient is [place] P2. She went through menopause in her late 75s. She took no hormone replacement. She took oral contraceptives for approximately 2 years remotely without complications.  SOCIAL HISTORY:  Erica Young is originally from [place], [place]. She worked as a Education officer, museum, particularly, in the field of substance abuse. Her husband, Erica Young, is a retired substance abuse Social worker. This is a second marriage for both of them. Amauria has 2 children from her first marriage, [name], lives in [place], and worked as a Audiological scientist but is now disabled,  and Lawrence Santiago, who lives in [place] and works in Engineer, mining. The patient has 6 grandchildren. She attends [employer].--Mikki Santee has 3 children from his first marriage. [name] Junior lives in [place] and has his own trucking business. He tells me he is estranged from his 2 daughters Amedeo Gory (a retired Pharmacist, hospital) and Salena Saner (who works in a bank). They are living on [place].   ADVANCED DIRECTIVES: in place   HEALTH MAINTENANCE: Social History   Tobacco Use  . Smoking status: Former Smoker    Packs/day: 0.50    Years: 20.00    Pack years: 10.00    Types: Cigarettes    Last attempt to quit: 05/01/1991    Years since quitting: 26.5  . Smokeless tobacco: Never Used  Substance Use Topics  . Alcohol use: No    Alcohol/week: 0.0 oz  . Drug use: No     Colonoscopy:  PAP:  Bone density:   Allergies  Allergen Reactions  . Ciprofloxacin Anaphylaxis    Current Outpatient Medications  Medication Sig Dispense Refill  . ALPRAZolam (XANAX) 0.25 MG tablet Take 1 tablet (0.25 mg total) by mouth 3 (three) times daily as needed. 90 tablet 0  . aspirin EC 81 MG tablet Take 81 mg by mouth daily after breakfast.     . azelastine (ASTELIN) 0.1 % nasal spray Place 1 spray into both nostrils 2 (two) times daily. Use in each nostril as directed (Patient not taking: Reported on 08/29/2017) 30 mL 5  . CARTIA XT 120 MG 24 hr capsule TAKE 1 CAPSULE BY MOUTH DAILY 90 capsule 0  . cetirizine (ZYRTEC) 10 MG tablet Take 10 mg by mouth daily.    . Cholecalciferol (VITAMIN D PO) Take 1,000 Units by mouth 2 (two) times daily.    Marland Kitchen docusate sodium (COLACE) 100 MG capsule Take 100 mg by mouth daily as needed for mild constipation.    . fluticasone (FLONASE) 50 MCG/ACT nasal spray Place 1 spray into both nostrils 2 (two) times daily. (Patient not taking: Reported on 08/29/2017) 18.2 g 5  . FULVESTRANT IM Inject into the muscle. Takes every 28 days    . Glucosamine 500 MG CAPS Take 500 mg by mouth  daily.     . meclizine (ANTIVERT) 25 MG tablet TAKE 1 TABLET(25 MG) BY MOUTH EVERY 6 HOURS AS NEEDED FOR DIZZINESS 60 tablet 0  . NON FORMULARY 2 capsules every morning. IB Guard    .  OVER THE COUNTER MEDICATION Beno for gas, takes twice daily    . palbociclib (IBRANCE) 75 MG capsule TAKE 1 CAPSULE BY MOUTH THREE TIMES A WEEK. TAKE EVERY MONDAY, WEDNESDAY, AND FRIDAY FOR 3 WEEKS THEN TAKE 1 WEEK OFF 9 capsule 6  . Probiotic Product (PROBIOTIC DAILY PO) Take 1 tablet by mouth daily.      Current Facility-Administered Medications  Medication Dose Route Frequency Provider Last Rate Last Dose  . 0.9 %  sodium chloride infusion  500 mL Intravenous Once Armbruster, Erica Raspberry, MD        OBJECTIVE: Middle-aged Latin American woman in no acute distress  Vitals:   11/21/17 0914  BP: 138/90  Pulse: 83  Resp: 18  Temp: 98.4 F (36.9 C)  SpO2: 98%     Body mass index is 23.19 kg/m.    ECOG FS:1 - Symptomatic but completely ambulatory  Sclerae unicteric, EOMs intact Oropharynx clear and moist No cervical or supraclavicular adenopathy Lungs no rales or rhonchi Heart regular rate and rhythm Abd soft, nontender, positive bowel sounds MSK she has pain in the right index finger but there is no obvious erythema or discoloration.  There is mild inflammation in the PIP joint and some discomfort with movement.  She has a good grip on the right and left  Neuro: nonfocal, well oriented, appropriate affect Breasts: Deferred  LAB RESULTS:  CMP     Component Value Date/Time   NA 138 11/21/2017 0836   NA 138 04/18/2017 0853   K 4.5 11/21/2017 0836   K 4.4 04/18/2017 0853   CL 102 11/21/2017 0836   CO2 30 11/21/2017 0836   CO2 29 04/18/2017 0853   GLUCOSE 105 (H) 11/21/2017 0836   GLUCOSE 95 04/18/2017 0853   BUN 15 11/21/2017 0836   BUN 16.6 04/18/2017 0853   CREATININE 0.78 11/21/2017 0836   CREATININE 0.8 04/18/2017 0853   CALCIUM 9.2 11/21/2017 0836   CALCIUM 9.2 04/18/2017 0853   PROT  7.0 11/21/2017 0836   PROT 7.0 04/18/2017 0853   ALBUMIN 3.7 11/21/2017 0836   ALBUMIN 3.8 04/18/2017 0853   AST 17 11/21/2017 0836   AST 17 04/18/2017 0853   ALT 16 11/21/2017 0836   ALT 16 04/18/2017 0853   ALKPHOS 84 11/21/2017 0836   ALKPHOS 90 04/18/2017 0853   BILITOT 0.3 11/21/2017 0836   BILITOT 0.63 04/18/2017 0853   GFRNONAA >60 11/21/2017 0836   GFRAA >60 11/21/2017 0836    INo results found for: SPEP, UPEP  Lab Results  Component Value Date   WBC 3.2 (L) 11/21/2017   NEUTROABS 1.6 11/21/2017   HGB 12.6 11/21/2017   HCT 36.2 11/21/2017   MCV 105.2 (H) 11/21/2017   PLT 172 11/21/2017      Chemistry      Component Value Date/Time   NA 138 11/21/2017 0836   NA 138 04/18/2017 0853   K 4.5 11/21/2017 0836   K 4.4 04/18/2017 0853   CL 102 11/21/2017 0836   CO2 30 11/21/2017 0836   CO2 29 04/18/2017 0853   BUN 15 11/21/2017 0836   BUN 16.6 04/18/2017 0853   CREATININE 0.78 11/21/2017 0836   CREATININE 0.8 04/18/2017 0853      Component Value Date/Time   CALCIUM 9.2 11/21/2017 0836   CALCIUM 9.2 04/18/2017 0853   ALKPHOS 84 11/21/2017 0836   ALKPHOS 90 04/18/2017 0853   AST 17 11/21/2017 0836   AST 17 04/18/2017 0853   ALT 16 11/21/2017 0836  ALT 16 04/18/2017 0853   BILITOT 0.3 11/21/2017 0836   BILITOT 0.63 04/18/2017 0853      Most recent CA-27-29 had risen to 57.6.  This is being repeated today  No components found for: LABCA125  No results for input(s): INR in the last 168 hours.  Urinalysis    Component Value Date/Time   COLORURINE STRAW (A) 11/19/2016 1750   APPEARANCEUR CLEAR 11/19/2016 1750   LABSPEC 1.003 (L) 11/19/2016 1750   PHURINE 7.0 11/19/2016 1750   GLUCOSEU NEGATIVE 11/19/2016 1750   HGBUR NEGATIVE 11/19/2016 1750   BILIRUBINUR NEGATIVE 11/19/2016 1750   BILIRUBINUR neg 02/16/2015 1318   KETONESUR NEGATIVE 11/19/2016 1750   PROTEINUR NEGATIVE 11/19/2016 1750   UROBILINOGEN 0.2 02/16/2015 1318   UROBILINOGEN 0.2  11/18/2013 1321   NITRITE NEGATIVE 11/19/2016 1750   LEUKOCYTESUR TRACE (A) 11/19/2016 1750     STUDIES: No results found.   ELIGIBLE FOR AVAILABLE RESEARCH PROTOCOL: no  ASSESSMENT: 77 y.o. [place] woman  (1) status post right breast upper outer quadrant lumpectomy and axillary lymph node dissection in 2007 for what appears to have been a T1 N0, stage IA invasive ductal carcinoma, treated adjuvantly with radiation (33 sessions)  METASTATIC DISEASE definitively documented Sept 2015 (2) bronchoscopic biopsy 01/07/2014 showed a low-grade mucinous breast cancer, strongly estrogen receptor positive, progesterone receptor positive, HER-2 negative; staging studies confirmed extensive hypermetabolic adenopathy, multiple bone lesions, likely early lung involvement, but no liver lesions.  (a) bone scan and chest CT 07/09/2016 shows stable disease.  (b) CA-27-29 is moderately informative  (c) bone scan and CT scan of the chest 04/24/2017 shows essentially stable disease  (3) on Arimidex between September 2015 and June 2017, with evidence of response; discontinued secondary to side effects  (a) bone density 04/10/2016 shows osteopenia with a T score of -2.1  (4) on monthly denosumab/Xgeva between 07/30/2014 and 10/04/2015, discontinued due to patient's concerns regarding osteonecrosis of the jaw  (a) discussed again October 2017, the patient adamantly refusing denosumab  (5) started fulvestrant 01/26/2016    (a) Palbociclib added at 75 mg M/W/F, beginning mid February 2018  PLAN I am not sure why Erica Young is having the problems that she is having with several fingers.  The exam today is generally unremarkable except for mild joint swelling, without tenderness or erythema.  I am going to get plain hand films just in case and I am going to add a rheumatoid factor to her next set of labs.  She is already scheduled for a bone scan and CT of the chest in August.  I am expecting good news.  If not  we do have other ways of treating her cancer  I suggested she try Aleve with meals 3 times a day when she has more problems with her hand.  Of course it would also take care of her mild intermittent headaches, which are likely due to sinus problems  She knows to call for any other issues that may develop before the next visit.   Erica Young, Erica Dad, MD  11/21/17 10:23 AM Medical Oncology and Hematology [facility] [address] [place], [place] [address] Tel. [phone]    Fax. [phone]  Erica Young, am acting as scribe for Chauncey Cruel MD.  I, Erica Del MD, have reviewed the above documentation for accuracy and completeness, and I agree with the above.

## 2017-11-21 ENCOUNTER — Inpatient Hospital Stay (HOSPITAL_BASED_OUTPATIENT_CLINIC_OR_DEPARTMENT_OTHER): Payer: Medicare Other | Admitting: Oncology

## 2017-11-21 ENCOUNTER — Inpatient Hospital Stay: Payer: Medicare Other

## 2017-11-21 ENCOUNTER — Ambulatory Visit (HOSPITAL_COMMUNITY)
Admission: RE | Admit: 2017-11-21 | Discharge: 2017-11-21 | Disposition: A | Discharge status: Home / Self Care | Payer: Medicare Other | Source: Ambulatory Visit | Attending: Oncology | Admitting: Oncology

## 2017-11-21 ENCOUNTER — Telehealth: Payer: Self-pay | Admitting: Oncology

## 2017-11-21 VITALS — BP 138/90 | HR 83 | Temp 98.4°F | Resp 18 | Ht 62.0 in | Wt 126.8 lb

## 2017-11-21 DIAGNOSIS — C773 Secondary and unspecified malignant neoplasm of axilla and upper limb lymph nodes: Secondary | ICD-10-CM

## 2017-11-21 DIAGNOSIS — M79641 Pain in right hand: Secondary | ICD-10-CM | POA: Diagnosis not present

## 2017-11-21 DIAGNOSIS — R51 Headache: Secondary | ICD-10-CM | POA: Diagnosis not present

## 2017-11-21 DIAGNOSIS — M8580 Other specified disorders of bone density and structure, unspecified site: Secondary | ICD-10-CM

## 2017-11-21 DIAGNOSIS — C7951 Secondary malignant neoplasm of bone: Secondary | ICD-10-CM

## 2017-11-21 DIAGNOSIS — C50911 Malignant neoplasm of unspecified site of right female breast: Secondary | ICD-10-CM

## 2017-11-21 DIAGNOSIS — Z170 Estrogen receptor positive status [ER+]: Secondary | ICD-10-CM | POA: Diagnosis not present

## 2017-11-21 DIAGNOSIS — C50411 Malignant neoplasm of upper-outer quadrant of right female breast: Secondary | ICD-10-CM

## 2017-11-21 DIAGNOSIS — M25449 Effusion, unspecified hand: Secondary | ICD-10-CM

## 2017-11-21 DIAGNOSIS — Z5111 Encounter for antineoplastic chemotherapy: Secondary | ICD-10-CM | POA: Diagnosis not present

## 2017-11-21 LAB — CBC WITH DIFFERENTIAL/PLATELET
Basophils Absolute: 0 10*3/uL (ref 0.0–0.1)
Basophils Relative: 1 %
EOS ABS: 0.1 10*3/uL (ref 0.0–0.5)
EOS PCT: 4 %
HCT: 36.2 % (ref 34.8–46.6)
HEMOGLOBIN: 12.6 g/dL (ref 11.6–15.9)
LYMPHS ABS: 1.1 10*3/uL (ref 0.9–3.3)
Lymphocytes Relative: 36 %
MCH: 36.5 pg — AB (ref 25.1–34.0)
MCHC: 34.7 g/dL (ref 31.5–36.0)
MCV: 105.2 fL — ABNORMAL HIGH (ref 79.5–101.0)
MONOS PCT: 9 %
Monocytes Absolute: 0.3 10*3/uL (ref 0.1–0.9)
Neutro Abs: 1.6 10*3/uL (ref 1.5–6.5)
Neutrophils Relative %: 50 %
PLATELETS: 172 10*3/uL (ref 145–400)
RBC: 3.44 MIL/uL — ABNORMAL LOW (ref 3.70–5.45)
RDW: 13 % (ref 11.2–14.5)
WBC: 3.2 10*3/uL — ABNORMAL LOW (ref 3.9–10.3)

## 2017-11-21 LAB — COMPREHENSIVE METABOLIC PANEL
ALK PHOS: 84 U/L (ref 38–126)
ALT: 16 U/L (ref 0–44)
AST: 17 U/L (ref 15–41)
Albumin: 3.7 g/dL (ref 3.5–5.0)
Anion gap: 6 (ref 5–15)
BUN: 15 mg/dL (ref 8–23)
CALCIUM: 9.2 mg/dL (ref 8.9–10.3)
CO2: 30 mmol/L (ref 22–32)
Chloride: 102 mmol/L (ref 98–111)
Creatinine, Ser: 0.78 mg/dL (ref 0.44–1.00)
GFR calc non Af Amer: >60 mL/min (ref >60)
Glucose, Bld: 105 mg/dL — ABNORMAL HIGH (ref 70–99)
Potassium: 4.5 mmol/L (ref 3.5–5.1)
SODIUM: 138 mmol/L (ref 135–145)
Total Bilirubin: 0.3 mg/dL (ref 0.3–1.2)
Total Protein: 7 g/dL (ref 6.5–8.1)

## 2017-11-21 NOTE — Telephone Encounter (Signed)
Gave avs and calendar ° °

## 2017-11-22 ENCOUNTER — Telehealth: Payer: Self-pay

## 2017-11-22 NOTE — Telephone Encounter (Signed)
-----   Message from Gardenia Phlegm, NP sent at 11/22/2017  2:04 PM EDT ----- Odette Horns does not show fracture please notify patient ----- Message ----- From: Interface, Rad Results In Sent: 11/21/2017   1:36 PM To: Chauncey Cruel, MD

## 2017-11-22 NOTE — Telephone Encounter (Signed)
Spoke with patient informing of xray results.  Patient voiced understanding.  No questions at this time.

## 2017-11-29 ENCOUNTER — Telehealth: Payer: Self-pay | Admitting: *Deleted

## 2017-11-29 NOTE — Telephone Encounter (Signed)
No entry 

## 2017-12-02 ENCOUNTER — Ambulatory Visit (HOSPITAL_COMMUNITY)
Admission: RE | Admit: 2017-12-02 | Discharge: 2017-12-02 | Disposition: A | Discharge status: Home / Self Care | Payer: Medicare Other | Source: Ambulatory Visit | Attending: Oncology | Admitting: Oncology

## 2017-12-02 ENCOUNTER — Encounter (HOSPITAL_COMMUNITY)
Admission: RE | Admit: 2017-12-02 | Discharge: 2017-12-02 | Disposition: A | Discharge status: Home / Self Care | Payer: Medicare Other | Source: Ambulatory Visit | Attending: Oncology | Admitting: Oncology

## 2017-12-02 ENCOUNTER — Inpatient Hospital Stay: Payer: Medicare Other | Attending: Oncology

## 2017-12-02 DIAGNOSIS — C50411 Malignant neoplasm of upper-outer quadrant of right female breast: Secondary | ICD-10-CM

## 2017-12-02 DIAGNOSIS — C771 Secondary and unspecified malignant neoplasm of intrathoracic lymph nodes: Secondary | ICD-10-CM | POA: Insufficient documentation

## 2017-12-02 DIAGNOSIS — C7971 Secondary malignant neoplasm of right adrenal gland: Secondary | ICD-10-CM | POA: Insufficient documentation

## 2017-12-02 DIAGNOSIS — Z5111 Encounter for antineoplastic chemotherapy: Secondary | ICD-10-CM | POA: Insufficient documentation

## 2017-12-02 DIAGNOSIS — C7800 Secondary malignant neoplasm of unspecified lung: Secondary | ICD-10-CM | POA: Insufficient documentation

## 2017-12-02 DIAGNOSIS — Z170 Estrogen receptor positive status [ER+]: Principal | ICD-10-CM

## 2017-12-02 DIAGNOSIS — I700 Atherosclerosis of aorta: Secondary | ICD-10-CM | POA: Insufficient documentation

## 2017-12-02 DIAGNOSIS — M8580 Other specified disorders of bone density and structure, unspecified site: Secondary | ICD-10-CM | POA: Diagnosis not present

## 2017-12-02 DIAGNOSIS — C7951 Secondary malignant neoplasm of bone: Secondary | ICD-10-CM | POA: Insufficient documentation

## 2017-12-02 DIAGNOSIS — C50919 Malignant neoplasm of unspecified site of unspecified female breast: Secondary | ICD-10-CM | POA: Diagnosis not present

## 2017-12-02 DIAGNOSIS — Z87891 Personal history of nicotine dependence: Secondary | ICD-10-CM | POA: Diagnosis not present

## 2017-12-02 DIAGNOSIS — C779 Secondary and unspecified malignant neoplasm of lymph node, unspecified: Secondary | ICD-10-CM | POA: Diagnosis not present

## 2017-12-02 DIAGNOSIS — Z853 Personal history of malignant neoplasm of breast: Secondary | ICD-10-CM

## 2017-12-02 DIAGNOSIS — R918 Other nonspecific abnormal finding of lung field: Secondary | ICD-10-CM | POA: Insufficient documentation

## 2017-12-02 DIAGNOSIS — Z9181 History of falling: Secondary | ICD-10-CM | POA: Diagnosis not present

## 2017-12-02 DIAGNOSIS — C50911 Malignant neoplasm of unspecified site of right female breast: Secondary | ICD-10-CM | POA: Diagnosis not present

## 2017-12-02 LAB — CBC WITH DIFFERENTIAL (CANCER CENTER ONLY)
Basophils Absolute: 0 10*3/uL (ref 0.0–0.1)
Basophils Relative: 1 %
EOS PCT: 3 %
Eosinophils Absolute: 0.1 10*3/uL (ref 0.0–0.5)
HCT: 38.2 % (ref 34.8–46.6)
HEMOGLOBIN: 12.8 g/dL (ref 11.6–15.9)
LYMPHS ABS: 1.3 10*3/uL (ref 0.9–3.3)
LYMPHS PCT: 37 %
MCH: 35.4 pg — AB (ref 25.1–34.0)
MCHC: 33.6 g/dL (ref 31.5–36.0)
MCV: 105.4 fL — AB (ref 79.5–101.0)
MONOS PCT: 5 %
Monocytes Absolute: 0.2 10*3/uL (ref 0.1–0.9)
Neutro Abs: 1.9 10*3/uL (ref 1.5–6.5)
Neutrophils Relative %: 54 %
PLATELETS: 177 10*3/uL (ref 145–400)
RBC: 3.62 MIL/uL — AB (ref 3.70–5.45)
RDW: 13.1 % (ref 11.2–14.5)
WBC: 3.6 10*3/uL — AB (ref 3.9–10.3)

## 2017-12-02 LAB — COMPREHENSIVE METABOLIC PANEL
ALK PHOS: 83 U/L (ref 38–126)
ALT: 17 U/L (ref 0–44)
ANION GAP: 9 (ref 5–15)
AST: 17 U/L (ref 15–41)
Albumin: 3.9 g/dL (ref 3.5–5.0)
BUN: 14 mg/dL (ref 8–23)
CALCIUM: 9.2 mg/dL (ref 8.9–10.3)
CO2: 28 mmol/L (ref 22–32)
CREATININE: 0.8 mg/dL (ref 0.44–1.00)
Chloride: 100 mmol/L (ref 98–111)
Glucose, Bld: 84 mg/dL (ref 70–99)
Potassium: 4.4 mmol/L (ref 3.5–5.1)
Sodium: 137 mmol/L (ref 135–145)
Total Bilirubin: 0.5 mg/dL (ref 0.3–1.2)
Total Protein: 7.2 g/dL (ref 6.5–8.1)

## 2017-12-02 LAB — SEDIMENTATION RATE: Sed Rate: 14 mm/hr (ref 0–22)

## 2017-12-02 MED ORDER — TECHNETIUM TC 99M MEDRONATE IV KIT
20.0000 | PACK | Freq: Once | INTRAVENOUS | Status: AC | PRN
  Administered 2017-12-02: 20.7 via INTRAVENOUS

## 2017-12-02 MED ORDER — IOHEXOL 300 MG/ML  SOLN
75.0000 mL | Freq: Once | INTRAMUSCULAR | Status: AC | PRN
  Administered 2017-12-02: 75 mL via INTRAVENOUS

## 2017-12-02 NOTE — Progress Notes (Signed)
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  She continues on palbociclib, currently at 75 mg on Mondays, Wednesdays and Fridays for 3 week on and 7 days off. She tolerates this well, she is on her 3rd week at this time. She will start her next cycle on 12/23/2017.   She receives monthly fulvestrant with her most recent dose on 11/07/2017. She tolerates this well.   Since her most recent visit to the office, she underwent CT chest w contrast on 12/02/2017 with results showing: Progressive thoracic nodal metastases, as above. Mildly progressive pulmonary nodules/metastases, as above. New right adrenal metastasis. Stable sclerotic metastasis involving the T8 vertebral body. Correlate with pending bone scan. Aortic Atherosclerosis (ICD10-I70.0).  She also had a whole body bone scan completed on 12/02/2017 with results of: Fairly stable foci of metastatic disease in the thoracic and lumbar spine and proximal right femur. Mildly increased uptake associated with the left acetabulum medially is nonspecific but could reflect early metastatic disease.      REVIEW OF SYSTEMS: Erica Young reports that her right index finger is painful at the joints and that  her skin is peeling at this time. She reports that she fell in her home and had issues with her left hip. She recalls a dental infection requiring in-grafting. She denies unusual headaches, visual changes, nausea, vomiting, or dizziness. There has been no unusual cough, phlegm production, or pleurisy. This been no change in bowel or bladder habits. She denies unexplained fatigue or unexplained weight loss, bleeding, rash, or fever. A detailed review of systems was otherwise stable.      BREAST CANCER HISTORY: From the earlier summary note  Erica Young tells me in 2007 while living in [place] she was found to have a very small cancer in the upper-outer quadrant of the right breast, about the size of the P, noted on mammography. It was not palpable. She had a right lumpectomy and full sentinel lymph node sampling. She then received adjuvant radiation, to a total of 33 treatments. She received no systemic therapy.  She then did well until December 2014, when she had a fall and complain of pain in her left ribs. Rib films and chest x-ray on 04/13/2013 showed no fractures, but CT of the abdomen and pelvis on the same day found subcarinal and right hilar adenopathy. This was followed up with a chest CT scan 05/05/2013 confirming extensive mediastinal and right hilar lymphadenopathy, with multiple pulmonary nodules, the largest measuring 0.9 cm. PET scan 05/21/2013 showed hypermetabolic adenopathy in the right and left paratracheal areas, the precarinal, subcarinal and right hilar areas, but no involvement of the liver, and the lung nodules were not hypermetabolic (although they were below the level of reliable detection). In addition, at L5 there was a lucency measuring 1.2 cm.  The PET scan also showed a hypermetabolic focus on  the thyroid gland, which was evaluated with neck ultrasound and biopsy 40/98/1191, showing a follicular lesion of undetermined significance (([id number]).  The patient was referred to  pulmonary [Dr Melvyn Novas and Dr Julien Nordmann for further evaluation. Repeat PET scan 12/28/2013 showed, in addition to the adenopathy previously noted, now multiple bony metastases. On 01/07/2014 the patient underwent bronchoscopy and this showed ([id number], together with separate cytology] a low-grade mucinous invasive breast cancer, estrogen receptor 100% positive, progesterone receptor 14% positive, with no HER-2 amplification, the signals ratio being 1.30 and the number per cell 1.95.  The patient was started on anastrozole 01/22/2014; monthly denosumab/Xgeva was added 07/30/2014. She appeared to tolerate this well, and her CA-27-29 (127 at baseline) normalized. Most recent CT scans of chest abdomen and pelvis 10/03/2015 showed continuing response. Despite this good news however, the patient decided to go off anastrozole and denosumab/Xgeva as of June 2017. By the time she saw Dr. Earlie Server again in September 2017 her tumor marker had doubled. At that time the patient was referred to the breast clinic for a second opinion regarding further evaluation and treatment.   PAST MEDICAL HISTORY: Past Medical History:  Diagnosis Date  . Allergy   . Anxiety   . Bone cancer ([place]) mri 11/11/14   right parietal bone,left cribriform plate metastases  . Cancer Procedure [facility]) 2007   right breat ca  , lumpectomy and radiation tx (declined chemo and additional prophylactic meds)  . Cataract    both eyes  10/2016 Lt.     04/2017 Rt.   . CEREBROVASCULAR DISEASE 03/03/2010  . Diverticulitis   . DIVERTICULOSIS, COLON 03/03/2010  . Fatty liver 08/02/09   as per U/S done by [facility]  . Foramen ovale    positive bubble study  . GERD (gastroesophageal reflux disease)   . Headache(784.0)    she thinks sinus headaches  . History of cerebral artery stenosis    right middle  . HYPERLIPIDEMIA 03/03/2010  . Hypertension   . HYPOTHYROIDISM 03/03/2010   no longer on meds  . Internal hemorrhoid   . Low back pain   .  Mastoiditis    noted on brain MRI  . Rib fracture   . Right leg pain   . Shortness of breath   . Stroke [facility]) Oct. 2011   TIA  . Ulcer     PAST SURGICAL HISTORY: Past Surgical History:  Procedure Laterality Date  . BREAST LUMPECTOMY     right  . CATARACT EXTRACTION Left    10/2016   Rt 04/2017  . CHOLECYSTECTOMY    . COLONOSCOPY    . POLYPECTOMY     benign cecum  . SHOULDER SURGERY     right shoulder (dislocation)  . TONSILLECTOMY    . TUBAL LIGATION    . UPPER GASTROINTESTINAL ENDOSCOPY    . VIDEO BRONCHOSCOPY WITH ENDOBRONCHIAL ULTRASOUND N/A 01/07/2014   Procedure: VIDEO BRONCHOSCOPY WITH ENDOBRONCHIAL ULTRASOUND;  Surgeon: Ivin Poot, MD;  Location: [facility] OR;  Service: Thoracic;  Laterality: N/A;    FAMILY HISTORY Family History  Problem Relation Age of Onset  . Heart disease Sister        cerebral vascular disease also  . Heart disease Brother        cerebral vascular disease also  . Heart disease Brother        cerebral vascular disease  . Heart disease Sister        cebreal vascular disease also  . Heart disease Sister  cebreal vascular diease also  . Heart disease Sister        cerebral vascular diease also  . Stomach cancer Father   . Colon cancer Neg Hx   . Esophageal cancer Neg Hx   . Rectal cancer Neg Hx   . Colon polyps Neg Hx   . Asthma Neg Hx   The patient's father died from stomach cancer in his late 4s. The patient's mother died in her 32s from a stroke. The patient has 2 brothers, 4 sisters. One sister had leukemia. There is no history of breast, colon, or ovarian cancer in the family to her knowledge  GYNECOLOGIC HISTORY:  No LMP recorded. Patient is postmenopausal. Menarche age 21, first live birth age 25, the patient is [place] P2. She went through menopause in her late 50s. She took no hormone replacement. She took oral contraceptives for approximately 2 years remotely without complications.  SOCIAL HISTORY:  Reniyah is originally from  [place], [place]. She worked as a Education officer, museum, particularly, in the field of substance abuse. Her husband, Mortimer Fries, is a retired substance abuse Social worker. This is a second marriage for both of them. Lakiesha has 2 children from her first marriage, [name], lives in [place], and worked as a Audiological scientist but is now disabled, and Lawrence Santiago, who lives in [place] and works in Engineer, mining. The patient has 6 grandchildren. She attends [employer].--Mikki Santee has 3 children from his first marriage. [name] Junior lives in [place] and has his own trucking business. He tells me he is estranged from his 2 daughters Amedeo Gory (a retired Pharmacist, hospital) and Salena Saner (who works in a bank). They are living on [place].   ADVANCED DIRECTIVES: in place   HEALTH MAINTENANCE: Social History   Tobacco Use  . Smoking status: Former Smoker    Packs/day: 0.50    Years: 20.00    Pack years: 10.00    Types: Cigarettes    Last attempt to quit: 05/01/1991    Years since quitting: 26.6  . Smokeless tobacco: Never Used  Substance Use Topics  . Alcohol use: No    Alcohol/week: 0.0 standard drinks  . Drug use: No     Colonoscopy:  PAP:  Bone density:   Allergies  Allergen Reactions  . Ciprofloxacin Anaphylaxis    Current Outpatient Medications  Medication Sig Dispense Refill  . ALPRAZolam (XANAX) 0.25 MG tablet Take 1 tablet (0.25 mg total) by mouth 3 (three) times daily as needed. 90 tablet 0  . aspirin EC 81 MG tablet Take 81 mg by mouth daily after breakfast.     . CARTIA XT 120 MG 24 hr capsule TAKE 1 CAPSULE BY MOUTH DAILY 90 capsule 0  . cetirizine (ZYRTEC) 10 MG tablet Take 10 mg by mouth daily.    . Cholecalciferol (VITAMIN D PO) Take 1,000 Units by mouth 2 (two) times daily.    Marland Kitchen docusate sodium (COLACE) 100 MG capsule Take 100 mg by mouth daily as needed for mild constipation.    . FULVESTRANT IM Inject into the muscle. Takes every 28 days    . Glucosamine 500 MG CAPS Take 500  mg by mouth daily.     . meclizine (ANTIVERT) 25 MG tablet TAKE 1 TABLET(25 MG) BY MOUTH EVERY 6 HOURS AS NEEDED FOR DIZZINESS 60 tablet 0  . OVER THE COUNTER MEDICATION Beno for gas, takes twice daily    . palbociclib (IBRANCE) 75 MG capsule TAKE 1 CAPSULE BY MOUTH THREE  TIMES A WEEK. TAKE EVERY MONDAY, WEDNESDAY, AND FRIDAY FOR 3 WEEKS THEN TAKE 1 WEEK OFF 9 capsule 6  . Probiotic Product (PROBIOTIC DAILY PO) Take 1 tablet by mouth daily.      Current Facility-Administered Medications  Medication Dose Route Frequency Provider Last Rate Last Dose  . 0.9 %  sodium chloride infusion  500 mL Intravenous Once Armbruster, Carlota Raspberry, MD       Facility-Administered Medications Ordered in Other Visits  Medication Dose Route Frequency Provider Last Rate Last Dose  . fulvestrant (FASLODEX) injection 500 mg  500 mg Intramuscular Once Lua Feng, Virgie Dad, MD        OBJECTIVE: Middle-aged Latin American woman   Vitals:   12/05/17 1227  BP: 121/84  Pulse: 74  Resp: 18  Temp: 98.4 F (36.9 C)  SpO2: 98%     Body mass index is 23.36 kg/m.    ECOG FS:1 - Symptomatic but completely ambulatory  The index finger of her right hand is showing very good healing, with clear new skin, and some peeling of the old skin.  There is no breakdown.  There is no significant joint swelling.  She has good mobility of the joint.  LAB RESULTS:  CMP     Component Value Date/Time   NA 137 12/02/2017 1256   NA 138 04/18/2017 0853   K 4.4 12/02/2017 1256   K 4.4 04/18/2017 0853   CL 100 12/02/2017 1256   CO2 28 12/02/2017 1256   CO2 29 04/18/2017 0853   GLUCOSE 84 12/02/2017 1256   GLUCOSE 95 04/18/2017 0853   BUN 14 12/02/2017 1256   BUN 16.6 04/18/2017 0853   CREATININE 0.80 12/02/2017 1256   CREATININE 0.8 04/18/2017 0853   CALCIUM 9.2 12/02/2017 1256   CALCIUM 9.2 04/18/2017 0853   PROT 7.2 12/02/2017 1256   PROT 7.0 04/18/2017 0853   ALBUMIN 3.9 12/02/2017 1256   ALBUMIN 3.8 04/18/2017 0853   AST 17  12/02/2017 1256   AST 17 04/18/2017 0853   ALT 17 12/02/2017 1256   ALT 16 04/18/2017 0853   ALKPHOS 83 12/02/2017 1256   ALKPHOS 90 04/18/2017 0853   BILITOT 0.5 12/02/2017 1256   BILITOT 0.63 04/18/2017 0853   GFRNONAA >60 12/02/2017 1256   GFRAA >60 12/02/2017 1256    INo results found for: SPEP, UPEP  Lab Results  Component Value Date   WBC 3.6 (L) 12/02/2017   NEUTROABS 1.9 12/02/2017   HGB 12.8 12/02/2017   HCT 38.2 12/02/2017   MCV 105.4 (H) 12/02/2017   PLT 177 12/02/2017      Chemistry      Component Value Date/Time   NA 137 12/02/2017 1256   NA 138 04/18/2017 0853   K 4.4 12/02/2017 1256   K 4.4 04/18/2017 0853   CL 100 12/02/2017 1256   CO2 28 12/02/2017 1256   CO2 29 04/18/2017 0853   BUN 14 12/02/2017 1256   BUN 16.6 04/18/2017 0853   CREATININE 0.80 12/02/2017 1256   CREATININE 0.8 04/18/2017 0853      Component Value Date/Time   CALCIUM 9.2 12/02/2017 1256   CALCIUM 9.2 04/18/2017 0853   ALKPHOS 83 12/02/2017 1256   ALKPHOS 90 04/18/2017 0853   AST 17 12/02/2017 1256   AST 17 04/18/2017 0853   ALT 17 12/02/2017 1256   ALT 16 04/18/2017 0853   BILITOT 0.5 12/02/2017 1256   BILITOT 0.63 04/18/2017 0853      Most recent CA-27-29 had risen  to 57.6.  This is being repeated today  No components found for: LABCA125  No results for input(s): INR in the last 168 hours.  Urinalysis    Component Value Date/Time   COLORURINE STRAW (A) 11/19/2016 1750   APPEARANCEUR CLEAR 11/19/2016 1750   LABSPEC 1.003 (L) 11/19/2016 1750   PHURINE 7.0 11/19/2016 1750   GLUCOSEU NEGATIVE 11/19/2016 1750   HGBUR NEGATIVE 11/19/2016 1750   BILIRUBINUR NEGATIVE 11/19/2016 1750   BILIRUBINUR neg 02/16/2015 1318   KETONESUR NEGATIVE 11/19/2016 1750   PROTEINUR NEGATIVE 11/19/2016 1750   UROBILINOGEN 0.2 02/16/2015 1318   UROBILINOGEN 0.2 11/18/2013 1321   NITRITE NEGATIVE 11/19/2016 1750   LEUKOCYTESUR TRACE (A) 11/19/2016 1750     STUDIES: Ct Chest W  Contrast  Result Date: 12/02/2017 CLINICAL DATA:  Metastatic breast cancer, oral chemotherapy ongoing EXAM: CT CHEST WITH CONTRAST TECHNIQUE: Multidetector CT imaging of the chest was performed during intravenous contrast administration. CONTRAST:  10m OMNIPAQUE IOHEXOL 300 MG/ML  SOLN COMPARISON:  CT chest dated 04/24/2017. Whole-body bone scan dated 04/24/2017. FINDINGS: Cardiovascular: Heart is normal in size.  No pericardial effusion. No evidence of thoracic aortic aneurysm. Atherosclerotic calcifications of the aortic arch. Coronary atherosclerosis of the LAD. Mediastinum/Nodes: Mildly progressive thoracic lymphadenopathy, including: --15 mm short axis right paratracheal node (series 3/image 37), unchanged --8 mm short axis right IMA node (series 2/image 40), unchanged --16 mm short axis AP window node (series 2/image 52), previously 14 mm --17 mm short axis right hilar node (series 2/image 59), previously 10 mm --16 mm short axis subcarinal node (series 2/image 60), previously 14 mm Status post right axillary lymph node dissection. Visualized thyroid is unremarkable. Lungs/Pleura: 11 x 7 mm pleural-based nodule in the lateral left upper lobe (series 5/image 81), previously 10 x 7 mm. 9 x 7 mm subpleural nodule in the posterior right lower lobe (series 5/image 75). 7 mm subpleural nodule in the posterior right upper lobe (series 5/image 29), new. Additional stable nodularity in the right upper lobe (series 5/image 23) and stable irregular/branching nodularity in the right lower lobe (series 5/image 75). Mild subpleural reticulation/scarring in the left lower lobe. No focal consolidation. Mild biapical pleural-parenchymal scarring, left greater than right. No pleural effusion or pneumothorax. Upper Abdomen: Visualized upper abdomen is notable for a 2.3 cm right adrenal nodule/metastasis, new. Prior cholecystectomy. 10 mm right upper pole renal cyst (series 2/image 135). Musculoskeletal: Stable sclerotic  metastasis involving the T8 vertebral body. Correlate with pending bone scan. IMPRESSION: Progressive thoracic nodal metastases, as above. Mildly progressive pulmonary nodules/metastases, as above. New right adrenal metastasis. Stable sclerotic metastasis involving the T8 vertebral body. Correlate with pending bone scan. Aortic Atherosclerosis (ICD10-I70.0). Electronically Signed   By: SJulian HyM.D.   On: 12/02/2017 11:34   Nm Bone Scan Whole Body  Result Date: 12/02/2017 CLINICAL DATA:  Right breast cancer. Intermittent knee and right arm pain. No recent trauma or surgery. EXAM: NUCLEAR MEDICINE WHOLE BODY BONE SCAN TECHNIQUE: Whole body anterior and posterior images were obtained approximately 3 hours after intravenous injection of radiopharmaceutical. RADIOPHARMACEUTICALS:  20.7 mCi Technetium-956mDP IV COMPARISON:  Nuclear bone scan of April 24, 2017 FINDINGS: There is adequate uptake of the radiopharmaceutical by the skeleton. There is adequate soft tissue clearance and renal activity. There is patchy uptake over the right frontal bone which is more conspicuous today. There is increased uptake in the ethmoid sinus region which is also more conspicuous. There is some increased activity in the maxillary region in the midline  and to the left. There is a mild increased uptake about both shoulders that is likely degenerative and is unchanged. There is increased uptake within approximately T7 which is stable allowing for differences in imaging technique. There is increased uptake in the body of approximately T12 or L1 slightly more conspicuous overall today. There is increased uptake posteriorly at approximately L2 and at L5 both of which appear more conspicuous. Uptake within the pelvis is within the limits of normal except for slightly increased activity over the left acetabulum. There is a stable focus of increased activity in the proximal diaphysis of the right femur. There are degenerative  changes about the knees. IMPRESSION: Fairly stable foci of metastatic disease in the thoracic and lumbar spine and proximal right femur. Mildly increased uptake associated with the left acetabulum medially is nonspecific but could reflect early metastatic disease. Plain radiographs of the left hip are recommended. Electronically Signed   By: David  Martinique M.D.   On: 12/02/2017 14:43   Dg Hand Complete Right  Result Date: 11/21/2017 CLINICAL DATA:  Hand pain in right index finger EXAM: RIGHT HAND - COMPLETE 3+ VIEW COMPARISON:  None. FINDINGS: Mild soft tissue swelling in the right index finger. No acute bony abnormality. Specifically, no fracture, subluxation, or dislocation. Joint spaces maintained. No bony erosions. No radiographic changes of osteomyelitis. IMPRESSION: No acute bony abnormality. Electronically Signed   By: Rolm Baptise M.D.   On: 11/21/2017 13:34     ELIGIBLE FOR AVAILABLE RESEARCH PROTOCOL: no  ASSESSMENT: 77 y.o. [place] woman  (1) status post right breast upper outer quadrant lumpectomy and axillary lymph node dissection in 2007 for what appears to have been a T1 N0, stage IA invasive ductal carcinoma, treated adjuvantly with radiation (33 sessions)  METASTATIC DISEASE definitively documented Sept 2015 (2) bronchoscopic biopsy 01/07/2014 showed a low-grade mucinous breast cancer, strongly estrogen receptor positive, progesterone receptor positive, HER-2 negative; staging studies confirmed extensive hypermetabolic adenopathy, multiple bone lesions, likely early lung involvement, but no liver lesions.  (a) bone scan and chest CT 07/09/2016 shows stable disease.  (b) CA-27-29 is moderately informative  (c) bone scan and CT scan of the chest 04/24/2017 shows essentially stable disease  (3) on Arimidex between September 2015 and June 2017, with evidence of response; discontinued secondary to side effects  (a) bone density 04/10/2016 shows osteopenia with a T score of  -2.1  (4) on monthly denosumab/Xgeva between 07/30/2014 and 10/04/2015, discontinued due to patient's concerns regarding osteonecrosis of the jaw  (a) discussed again October 2017, the patient adamantly refusing denosumab  (5) started fulvestrant 01/26/2016    (a) Palbociclib added at 75 mg M/W/F, beginning mid February 2018  (b) chest CT scan obtained 12/02/2017 shows small but measurable growth of lung lesions; bones are stable  (c) palbociclib dose increased to 75 mg daily 21/7 beginning 12/23/2017  PLAN I spent the better part of today's visit discussing the patient's situation with her and her husband.  She is on a very minimal dose of palbociclib.  Her counts are holding up very nicely.  She should be able to tolerate a higher dose.  I think that would be the simplest thing to do now that we are seeing a little bit of growth in her measurable disease.  The change in her left hip area on the bone scan are likely due to a fall she had in the last couple of weeks  Her tumor markers are also not definitively trending upward.  In general I  think she has had a very good run on the current medications and has tolerated them relatively well considering how difficult it is for her to do with any kind of medication.  We could go to exemestane and everolimus or a different combination but I am afraid she would have more not fewer side effects with those  After much discussion then she is willing to give the increased palbociclib dose a try.  She will complete the current cycle at Monday Wednesday and Friday dosing.  She will be off a week, then on 12/23/2017 she will start daily palbociclib at 75 mg for 21 days then 7 days off.  I am going to see her on 01/03/2018 just to make sure she is tolerating this well  She knows to call for any other issues that may develop before the next visit.   Kari Montero, Virgie Dad, MD  12/05/17 1:02 PM Medical Oncology and Hematology [facility] [address] [place], [place] [address] Tel. [phone]    Fax. [phone]    I, Soijett Blue am acting as scribe for Dr. Sarajane Jews C. Oyuki Hogan.  I, Lurline Del MD, have reviewed the above documentation for accuracy and completeness, and I agree with the above.

## 2017-12-03 LAB — CANCER ANTIGEN 27.29: CAN 27.29: 69.9 U/mL — AB (ref 0.0–38.6)

## 2017-12-03 LAB — RHEUMATOID FACTOR

## 2017-12-05 ENCOUNTER — Other Ambulatory Visit: Payer: Medicare Other

## 2017-12-05 ENCOUNTER — Ambulatory Visit: Payer: Medicare Other

## 2017-12-05 ENCOUNTER — Inpatient Hospital Stay: Payer: Medicare Other

## 2017-12-05 ENCOUNTER — Inpatient Hospital Stay (HOSPITAL_BASED_OUTPATIENT_CLINIC_OR_DEPARTMENT_OTHER): Payer: Medicare Other | Admitting: Oncology

## 2017-12-05 ENCOUNTER — Telehealth: Payer: Self-pay | Admitting: Oncology

## 2017-12-05 VITALS — BP 121/84 | HR 74 | Temp 98.4°F | Resp 18 | Ht 62.0 in | Wt 127.7 lb

## 2017-12-05 DIAGNOSIS — C7951 Secondary malignant neoplasm of bone: Secondary | ICD-10-CM

## 2017-12-05 DIAGNOSIS — Z5111 Encounter for antineoplastic chemotherapy: Secondary | ICD-10-CM | POA: Diagnosis not present

## 2017-12-05 DIAGNOSIS — Z9181 History of falling: Secondary | ICD-10-CM

## 2017-12-05 DIAGNOSIS — Z170 Estrogen receptor positive status [ER+]: Secondary | ICD-10-CM

## 2017-12-05 DIAGNOSIS — M8580 Other specified disorders of bone density and structure, unspecified site: Secondary | ICD-10-CM | POA: Diagnosis not present

## 2017-12-05 DIAGNOSIS — C7800 Secondary malignant neoplasm of unspecified lung: Secondary | ICD-10-CM

## 2017-12-05 DIAGNOSIS — I700 Atherosclerosis of aorta: Secondary | ICD-10-CM

## 2017-12-05 DIAGNOSIS — Z87891 Personal history of nicotine dependence: Secondary | ICD-10-CM | POA: Diagnosis not present

## 2017-12-05 DIAGNOSIS — C50411 Malignant neoplasm of upper-outer quadrant of right female breast: Secondary | ICD-10-CM | POA: Diagnosis not present

## 2017-12-05 DIAGNOSIS — C7971 Secondary malignant neoplasm of right adrenal gland: Secondary | ICD-10-CM

## 2017-12-05 DIAGNOSIS — C771 Secondary and unspecified malignant neoplasm of intrathoracic lymph nodes: Secondary | ICD-10-CM | POA: Diagnosis not present

## 2017-12-05 MED ORDER — PALBOCICLIB 75 MG PO CAPS: 75.0000 mg | ORAL_CAPSULE | Freq: Every day | ORAL | 6 refills | Status: DC

## 2017-12-05 MED ORDER — FULVESTRANT 250 MG/5ML IM SOLN
INTRAMUSCULAR | Status: AC
  Filled 2017-12-05: qty 10

## 2017-12-05 MED ORDER — FULVESTRANT 250 MG/5ML IM SOLN
500.0000 mg | Freq: Once | INTRAMUSCULAR | Status: AC
  Administered 2017-12-05: 500 mg via INTRAMUSCULAR

## 2017-12-05 NOTE — Telephone Encounter (Signed)
Gave avs and calendar ° °

## 2017-12-10 ENCOUNTER — Other Ambulatory Visit: Payer: Self-pay | Admitting: Internal Medicine

## 2017-12-18 ENCOUNTER — Ambulatory Visit: Payer: Medicare Other | Admitting: Internal Medicine

## 2017-12-19 MED FILL — IBRANCE 75 MG CAPSULE: 75 | 28 days supply | Qty: 21 | Fill #0

## 2018-01-02 ENCOUNTER — Other Ambulatory Visit: Payer: Medicare Other

## 2018-01-02 NOTE — Progress Notes (Signed)
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 She continues on palbociblib, currently at 75 mg, recently increased to daily 3 weeks on, 1 week off, whereas previously it was only on Mondays Wednesdays and Fridays. She tolerates this fairly well. She is having insomnia. She is fatigued during the day, and she falls asleep easily at night, but she wakes up around 3-4 am and has trouble getting back to sleep. She continues to lay in her bed until about 6 am to have breakfast. She rarely takes naps in the daytime. She takes Benadryl at night to help her sleep.   She also receives fulvestrant every 4 weeks, with a dose due today. She tolerates this well without any complications from the injections.     REVIEW OF SYSTEMS: Erica Young reports that she feels SOB sometimes.  She is worried that her palbociclib could affect her iron levels and increase her risk for getting sick.  She denies unusual headaches, visual changes, nausea, vomiting, or dizziness. There has been no unusual cough, phlegm production, or pleurisy. There has been no change in bowel or bladder habits. She denies unexplained fatigue or unexplained weight loss, bleeding, rash, or fever. A  detailed review of systems was otherwise stable.      BREAST CANCER HISTORY: From the earlier summary note  Erica Young tells me in 2007 while living in [place] she was found to have a very small cancer in the upper-outer quadrant of the right breast, about the size of the P, noted on mammography. It was not palpable. She had a right lumpectomy and full sentinel lymph node sampling. She then received adjuvant radiation, to a total of 33 treatments. She received no systemic therapy.  She then did well until December 2014, when she had a fall and complain of pain in her left ribs. Rib films and chest x-ray on 04/13/2013 showed no fractures, but CT of the abdomen and pelvis on the same day found subcarinal and right hilar adenopathy. This was followed up with a chest CT scan 05/05/2013 confirming extensive mediastinal and right hilar lymphadenopathy, with multiple pulmonary nodules, the largest measuring 0.9 cm. PET scan 05/21/2013 showed hypermetabolic adenopathy in the right and left paratracheal areas, the precarinal, subcarinal and right hilar areas, but no involvement of the liver, and the lung nodules were not hypermetabolic (although they were below the level of reliable detection). In addition, at L5 there was a lucency measuring 1.2 cm.  The PET scan also showed a hypermetabolic focus on the thyroid gland, which was evaluated with neck ultrasound and biopsy 53/20/2334, showing a follicular lesion of undetermined significance (([id number]).  The patient was referred to pulmonary [Dr Melvyn Novas and Dr Julien Nordmann for further evaluation. Repeat PET scan 12/28/2013 showed, in addition to the adenopathy previously noted, now multiple bony metastases. On 01/07/2014 the patient underwent bronchoscopy and this  showed (SZA [phone], together with separate cytology] a low-grade mucinous invasive breast cancer, estrogen receptor 100% positive, progesterone receptor 14% positive, with no HER-2 amplification, the signals  ratio being 1.30 and the number per cell 1.95.  The patient was started on anastrozole 01/22/2014; monthly denosumab/Xgeva was added 07/30/2014. She appeared to tolerate this well, and her CA-27-29 (127 at baseline) normalized. Most recent CT scans of chest abdomen and pelvis 10/03/2015 showed continuing response. Despite this good news however, the patient decided to go off anastrozole and denosumab/Xgeva as of June 2017. By the time she saw Dr. Earlie Server again in September 2017 her tumor marker had doubled. At that time the patient was referred to the breast clinic for a second opinion regarding further evaluation and treatment.   PAST MEDICAL HISTORY: Past Medical History:  Diagnosis Date  . Allergy   . Anxiety   . Bone cancer ([place]) mri 11/11/14   right parietal bone,left cribriform plate metastases  . Cancer [facility]) 2007   right breat ca  , lumpectomy and radiation tx (declined chemo and additional prophylactic meds)  . Cataract    both eyes  10/2016 Lt.     04/2017 Rt.   . CEREBROVASCULAR DISEASE 03/03/2010  . Diverticulitis   . DIVERTICULOSIS, COLON 03/03/2010  . Fatty liver 08/02/09   as per U/S done by [facility]  . Foramen ovale    positive bubble study  . GERD (gastroesophageal reflux disease)   . Headache(784.0)    she thinks sinus headaches  . History of cerebral artery stenosis    right middle  . HYPERLIPIDEMIA 03/03/2010  . Hypertension   . HYPOTHYROIDISM 03/03/2010   no longer on meds  . Internal hemorrhoid   . Low back pain   . Mastoiditis    noted on brain MRI  . Rib fracture   . Right leg pain   . Shortness of breath   . Stroke [facility]) Oct. 2011   TIA  . Ulcer     PAST SURGICAL HISTORY: Past Surgical History:  Procedure Laterality Date  . BREAST LUMPECTOMY     right  . CATARACT EXTRACTION Left    10/2016   Rt 04/2017  . CHOLECYSTECTOMY    . COLONOSCOPY    . POLYPECTOMY     benign cecum  . SHOULDER SURGERY     right shoulder (dislocation)  .  TONSILLECTOMY    . TUBAL LIGATION    . UPPER GASTROINTESTINAL ENDOSCOPY    . VIDEO BRONCHOSCOPY WITH ENDOBRONCHIAL ULTRASOUND N/A 01/07/2014   Procedure: VIDEO BRONCHOSCOPY WITH ENDOBRONCHIAL ULTRASOUND;  Surgeon: Ivin Poot, MD;  Location: [facility] OR;  Service: Thoracic;  Laterality: N/A;    FAMILY HISTORY Family History  Problem Relation Age of Onset  . Heart disease Sister        cerebral vascular disease also  . Heart disease Brother        cerebral vascular disease also  . Heart disease Brother        cerebral vascular disease  . Heart disease Sister        cebreal vascular disease also  . Heart disease Sister        cebreal vascular diease also  . Heart disease Sister        cerebral vascular diease also  . Stomach cancer Father   . Colon cancer Neg Hx   . Esophageal cancer Neg Hx   . Rectal cancer Neg Hx   . Colon polyps Neg Hx   .  Asthma Neg Hx   The patient's father died from stomach cancer in his late 69s. The patient's mother died in her 29s from a stroke. The patient has 2 brothers, 4 sisters. One sister had leukemia. There is no history of breast, colon, or ovarian cancer in the family to her knowledge  GYNECOLOGIC HISTORY:  No LMP recorded. Patient is postmenopausal. Menarche age 24, first live birth age 89, the patient is [place] P2. She went through menopause in her late 25s. She took no hormone replacement. She took oral contraceptives for approximately 2 years remotely without complications.  SOCIAL HISTORY:  Erica Young is originally from [place], [place]. She worked as a Education officer, museum, particularly, in the field of substance abuse. Her husband, Mortimer Fries, is a retired substance abuse Social worker. This is a second marriage for both of them. Erica Young has 2 children from her first marriage, [name], lives in [place], and worked as a Audiological scientist but is now disabled, and Lawrence Santiago, who lives in [place] and works in Engineer, mining. The patient has 6 grandchildren. She  attends [employer]Mikki Santee has 3 children from his first marriage. [name] Junior lives in [place] and has his own trucking business. He tells me he is estranged from his 2 daughters Amedeo Gory (a retired Pharmacist, hospital) and Salena Saner (who works in a bank). They are living on [place].   ADVANCED DIRECTIVES: in place   HEALTH MAINTENANCE: Social History   Tobacco Use  . Smoking status: Former Smoker    Packs/day: 0.50    Years: 20.00    Pack years: 10.00    Types: Cigarettes    Last attempt to quit: 05/01/1991    Years since quitting: 26.6  . Smokeless tobacco: Never Used  Substance Use Topics  . Alcohol use: No    Alcohol/week: 0.0 standard drinks  . Drug use: No     Colonoscopy:  PAP:  Bone density:   Allergies  Allergen Reactions  . Ciprofloxacin Anaphylaxis    Current Outpatient Medications  Medication Sig Dispense Refill  . ALPRAZolam (XANAX) 0.25 MG tablet TAKE 1 TABLET(0.25 MG) BY MOUTH THREE TIMES DAILY AS NEEDED 90 tablet 0  . aspirin EC 81 MG tablet Take 81 mg by mouth daily after breakfast.     . CARTIA XT 120 MG 24 hr capsule TAKE 1 CAPSULE BY MOUTH DAILY 90 capsule 0  . cetirizine (ZYRTEC) 10 MG tablet Take 10 mg by mouth daily.    . Cholecalciferol (VITAMIN D PO) Take 1,000 Units by mouth 2 (two) times daily.    Marland Kitchen docusate sodium (COLACE) 100 MG capsule Take 100 mg by mouth daily as needed for mild constipation.    . FULVESTRANT IM Inject into the muscle. Takes every 28 days    . Glucosamine 500 MG CAPS Take 500 mg by mouth daily.     . meclizine (ANTIVERT) 25 MG tablet TAKE 1 TABLET(25 MG) BY MOUTH EVERY 6 HOURS AS NEEDED FOR DIZZINESS 60 tablet 0  . OVER THE COUNTER MEDICATION Beno for gas, takes twice daily    . palbociclib (IBRANCE) 75 MG capsule Take 1 capsule (75 mg total) by mouth daily with breakfast. Take whole with food. Take for 21 days on, 7 days off, repeat every 28 days. 21 capsule 6  . Probiotic Product (PROBIOTIC DAILY PO) Take 1 tablet  by mouth daily.      Current Facility-Administered Medications  Medication Dose Route Frequency Provider Last Rate Last Dose  . 0.9 %  sodium chloride infusion  500 mL Intravenous Once Armbruster, Carlota Raspberry, MD        OBJECTIVE: Middle-aged Latin American woman who appears stated age  89:   01/03/18 0845  BP: 136/66  Pulse: 75  Resp: 18  Temp: 97.7 F (36.5 C)  SpO2: 100%     Body mass index is 23.1 kg/m.    ECOG FS:1 - Symptomatic but completely ambulatory  Sclerae unicteric, EOMs intact Oropharynx clear and moist No cervical or supraclavicular adenopathy Lungs no rales or rhonchi Heart regular rate and rhythm Abd soft, nontender, positive bowel sounds MSK no focal spinal tenderness, no upper extremity lymphedema Neuro: nonfocal, well oriented, appropriate affect Breasts: Deferred   LAB RESULTS:  CMP     Component Value Date/Time   NA 137 12/02/2017 1256   NA 138 04/18/2017 0853   K 4.4 12/02/2017 1256   K 4.4 04/18/2017 0853   CL 100 12/02/2017 1256   CO2 28 12/02/2017 1256   CO2 29 04/18/2017 0853   GLUCOSE 84 12/02/2017 1256   GLUCOSE 95 04/18/2017 0853   BUN 14 12/02/2017 1256   BUN 16.6 04/18/2017 0853   CREATININE 0.80 12/02/2017 1256   CREATININE 0.8 04/18/2017 0853   CALCIUM 9.2 12/02/2017 1256   CALCIUM 9.2 04/18/2017 0853   PROT 7.2 12/02/2017 1256   PROT 7.0 04/18/2017 0853   ALBUMIN 3.9 12/02/2017 1256   ALBUMIN 3.8 04/18/2017 0853   AST 17 12/02/2017 1256   AST 17 04/18/2017 0853   ALT 17 12/02/2017 1256   ALT 16 04/18/2017 0853   ALKPHOS 83 12/02/2017 1256   ALKPHOS 90 04/18/2017 0853   BILITOT 0.5 12/02/2017 1256   BILITOT 0.63 04/18/2017 0853   GFRNONAA >60 12/02/2017 1256   GFRAA >60 12/02/2017 1256    INo results found for: SPEP, UPEP  Lab Results  Component Value Date   WBC 2.9 (L) 01/03/2018   NEUTROABS 1.5 01/03/2018   HGB 12.8 01/03/2018   HCT 37.0 01/03/2018   MCV 104.6 (H) 01/03/2018   PLT 204 01/03/2018       Chemistry      Component Value Date/Time   NA 137 12/02/2017 1256   NA 138 04/18/2017 0853   K 4.4 12/02/2017 1256   K 4.4 04/18/2017 0853   CL 100 12/02/2017 1256   CO2 28 12/02/2017 1256   CO2 29 04/18/2017 0853   BUN 14 12/02/2017 1256   BUN 16.6 04/18/2017 0853   CREATININE 0.80 12/02/2017 1256   CREATININE 0.8 04/18/2017 0853      Component Value Date/Time   CALCIUM 9.2 12/02/2017 1256   CALCIUM 9.2 04/18/2017 0853   ALKPHOS 83 12/02/2017 1256   ALKPHOS 90 04/18/2017 0853   AST 17 12/02/2017 1256   AST 17 04/18/2017 0853   ALT 17 12/02/2017 1256   ALT 16 04/18/2017 0853   BILITOT 0.5 12/02/2017 1256   BILITOT 0.63 04/18/2017 0853      Most recent CA-27-29 had risen to 57.6.  This is being repeated today  No components found for: LABCA125  No results for input(s): INR in the last 168 hours.  Urinalysis    Component Value Date/Time   COLORURINE STRAW (A) 11/19/2016 1750   APPEARANCEUR CLEAR 11/19/2016 1750   LABSPEC 1.003 (L) 11/19/2016 1750   PHURINE 7.0 11/19/2016 1750   GLUCOSEU NEGATIVE 11/19/2016 1750   HGBUR NEGATIVE 11/19/2016 1750   BILIRUBINUR NEGATIVE 11/19/2016 1750   BILIRUBINUR neg 02/16/2015 1318   KETONESUR NEGATIVE  11/19/2016 1750   PROTEINUR NEGATIVE 11/19/2016 1750   UROBILINOGEN 0.2 02/16/2015 1318   UROBILINOGEN 0.2 11/18/2013 1321   NITRITE NEGATIVE 11/19/2016 1750   LEUKOCYTESUR TRACE (A) 11/19/2016 1750     STUDIES:  Bone scan on 12/02/2017 showed: Fairly stable foci of metastatic disease in the thoracic and lumbar spine and proximal right femur. Mildly increased uptake associated with the left acetabulum medially is nonspecific but could reflect early metastatic disease.  Chest CT on 12/02/2017 showed: Progressive thoracic nodal metastases. Mildly progressive pulmonary nodules/metastases. New right adrenal metastasis. Stable sclerotic metastasis involving the T8 vertebral body. Correlate with pending bone scan. Aortic  Atherosclerosis (ICD10-I70.0).  ELIGIBLE FOR AVAILABLE RESEARCH PROTOCOL: no  ASSESSMENT: 77 y.o. [place] woman  (1) status post right breast upper outer quadrant lumpectomy and axillary lymph node dissection in 2007 for what appears to have been a T1 N0, stage IA invasive ductal carcinoma, treated adjuvantly with radiation (33 sessions)  METASTATIC DISEASE definitively documented Sept 2015 (2) bronchoscopic biopsy 01/07/2014 showed a low-grade mucinous breast cancer, strongly estrogen receptor positive, progesterone receptor positive, HER-2 negative; staging studies confirmed extensive hypermetabolic adenopathy, multiple bone lesions, likely early lung involvement, but no liver lesions.  (a) bone scan and chest CT 07/09/2016 shows stable disease.  (b) CA-27-29 is moderately informative  (c) bone scan and CT scan of the chest 04/24/2017 shows essentially stable disease  (3) on Arimidex between September 2015 and June 2017, with evidence of response; discontinued secondary to side effects  (a) bone density 04/10/2016 shows osteopenia with a T score of -2.1  (4) on monthly denosumab/Xgeva between 07/30/2014 and 10/04/2015, discontinued due to patient's concerns regarding osteonecrosis of the jaw  (a) discussed again October 2017, the patient adamantly refusing denosumab  (5) started fulvestrant 01/26/2016    (a) Palbociclib added at 75 mg M/W/F, beginning mid February 2018  (b) chest CT scan obtained 12/02/2017 shows small but measurable growth of lung lesions; bones are stable  (c) palbociclib dose increased to 75 mg daily 21/7 beginning 12/23/2017  PLAN Erica Young is tolerating the increased dose of palbociclib remarkably well.  I think her body "got used to it" gradually over the past several months.  I am hoping with this simple change we may be able to reverse or at least stabilize the change in her CA-27-29 and more importantly her CT chest changes.  She will return 0904 for her next  Faslodex dose and then 02/27/2018.  She did not want to see my 84 assistant a month from now so she will see me in 2 months.  At that visit assuming all is going well I will set her up for repeat CT of the chest before her November dose  She knows to call for any other issues that may develop before the next visit here.   Magrinat, Virgie Dad, MD  01/03/18 9:06 AM Medical Oncology and Hematology [facility] [address] [place], [place] [address] Tel. [phone]    Fax. [phone]  Alice Rieger, am acting as scribe for Chauncey Cruel MD.  I, Lurline Del MD, have reviewed the above documentation for accuracy and completeness, and I agree with the above.

## 2018-01-03 ENCOUNTER — Inpatient Hospital Stay: Payer: Medicare Other | Attending: Oncology

## 2018-01-03 ENCOUNTER — Inpatient Hospital Stay: Payer: Medicare Other

## 2018-01-03 ENCOUNTER — Telehealth: Payer: Self-pay | Admitting: Oncology

## 2018-01-03 ENCOUNTER — Inpatient Hospital Stay (HOSPITAL_BASED_OUTPATIENT_CLINIC_OR_DEPARTMENT_OTHER): Payer: Medicare Other | Admitting: Oncology

## 2018-01-03 VITALS — BP 136/66 | HR 75 | Temp 97.7°F | Resp 18 | Ht 62.0 in | Wt 126.3 lb

## 2018-01-03 DIAGNOSIS — Z853 Personal history of malignant neoplasm of breast: Secondary | ICD-10-CM

## 2018-01-03 DIAGNOSIS — R5383 Other fatigue: Secondary | ICD-10-CM | POA: Diagnosis not present

## 2018-01-03 DIAGNOSIS — Z170 Estrogen receptor positive status [ER+]: Secondary | ICD-10-CM | POA: Insufficient documentation

## 2018-01-03 DIAGNOSIS — M8580 Other specified disorders of bone density and structure, unspecified site: Secondary | ICD-10-CM | POA: Diagnosis not present

## 2018-01-03 DIAGNOSIS — C7951 Secondary malignant neoplasm of bone: Secondary | ICD-10-CM

## 2018-01-03 DIAGNOSIS — C771 Secondary and unspecified malignant neoplasm of intrathoracic lymph nodes: Secondary | ICD-10-CM | POA: Diagnosis not present

## 2018-01-03 DIAGNOSIS — G4700 Insomnia, unspecified: Secondary | ICD-10-CM | POA: Diagnosis not present

## 2018-01-03 DIAGNOSIS — Z87891 Personal history of nicotine dependence: Secondary | ICD-10-CM | POA: Insufficient documentation

## 2018-01-03 DIAGNOSIS — C50411 Malignant neoplasm of upper-outer quadrant of right female breast: Secondary | ICD-10-CM | POA: Diagnosis not present

## 2018-01-03 DIAGNOSIS — C7800 Secondary malignant neoplasm of unspecified lung: Secondary | ICD-10-CM | POA: Insufficient documentation

## 2018-01-03 DIAGNOSIS — C7971 Secondary malignant neoplasm of right adrenal gland: Secondary | ICD-10-CM | POA: Insufficient documentation

## 2018-01-03 DIAGNOSIS — Z5111 Encounter for antineoplastic chemotherapy: Secondary | ICD-10-CM | POA: Diagnosis not present

## 2018-01-03 DIAGNOSIS — C50911 Malignant neoplasm of unspecified site of right female breast: Secondary | ICD-10-CM

## 2018-01-03 DIAGNOSIS — I700 Atherosclerosis of aorta: Secondary | ICD-10-CM | POA: Insufficient documentation

## 2018-01-03 LAB — COMPREHENSIVE METABOLIC PANEL
ALBUMIN: 3.6 g/dL (ref 3.5–5.0)
ALK PHOS: 83 U/L (ref 38–126)
ALT: 10 U/L (ref 0–44)
ANION GAP: 8 (ref 5–15)
AST: 15 U/L (ref 15–41)
BILIRUBIN TOTAL: 0.4 mg/dL (ref 0.3–1.2)
BUN: 15 mg/dL (ref 8–23)
CO2: 26 mmol/L (ref 22–32)
Calcium: 9.4 mg/dL (ref 8.9–10.3)
Chloride: 103 mmol/L (ref 98–111)
Creatinine, Ser: 0.85 mg/dL (ref 0.44–1.00)
GFR calc Af Amer: >60 mL/min (ref >60)
GFR calc non Af Amer: >60 mL/min (ref >60)
GLUCOSE: 108 mg/dL — AB (ref 70–99)
Potassium: 3.9 mmol/L (ref 3.5–5.1)
Sodium: 137 mmol/L (ref 135–145)
Total Protein: 6.8 g/dL (ref 6.5–8.1)

## 2018-01-03 LAB — CBC WITH DIFFERENTIAL/PLATELET
Basophils Absolute: 0 10*3/uL (ref 0.0–0.1)
Basophils Relative: 2 %
Eosinophils Absolute: 0.1 10*3/uL (ref 0.0–0.5)
Eosinophils Relative: 4 %
HEMATOCRIT: 37 % (ref 34.8–46.6)
HEMOGLOBIN: 12.8 g/dL (ref 11.6–15.9)
Lymphocytes Relative: 37 %
Lymphs Abs: 1.1 10*3/uL (ref 0.9–3.3)
MCH: 36.2 pg — ABNORMAL HIGH (ref 25.1–34.0)
MCHC: 34.6 g/dL (ref 31.5–36.0)
MCV: 104.6 fL — ABNORMAL HIGH (ref 79.5–101.0)
Monocytes Absolute: 0.1 10*3/uL (ref 0.1–0.9)
Monocytes Relative: 4 %
NEUTROS ABS: 1.5 10*3/uL (ref 1.5–6.5)
NEUTROS PCT: 53 %
Platelets: 204 10*3/uL (ref 145–400)
RBC: 3.53 MIL/uL — ABNORMAL LOW (ref 3.70–5.45)
RDW: 13.3 % (ref 11.2–14.5)
WBC: 2.9 10*3/uL — ABNORMAL LOW (ref 3.9–10.3)

## 2018-01-03 MED ORDER — FULVESTRANT 250 MG/5ML IM SOLN
INTRAMUSCULAR | Status: AC
  Filled 2018-01-03: qty 5

## 2018-01-03 MED ORDER — CARTIA XT 120 MG PO CP24: 120.0000 mg | ORAL_CAPSULE | Freq: Every day | ORAL | 0 refills | Status: DC

## 2018-01-03 MED ORDER — FULVESTRANT 250 MG/5ML IM SOLN
500.0000 mg | Freq: Once | INTRAMUSCULAR | Status: AC
  Administered 2018-01-03: 500 mg via INTRAMUSCULAR

## 2018-01-03 NOTE — Telephone Encounter (Signed)
Gave pt avs and calendar  °

## 2018-01-03 NOTE — Patient Instructions (Signed)

## 2018-01-04 LAB — CANCER ANTIGEN 27.29: CA 27.29: 70.3 U/mL — ABNORMAL HIGH (ref 0.0–38.6)

## 2018-01-10 ENCOUNTER — Other Ambulatory Visit: Payer: Self-pay | Admitting: Internal Medicine

## 2018-01-15 MED FILL — IBRANCE 75 MG CAPSULE: 75 | 28 days supply | Qty: 21 | Fill #1

## 2018-01-30 ENCOUNTER — Inpatient Hospital Stay: Payer: Medicare Other | Attending: Oncology

## 2018-01-30 ENCOUNTER — Inpatient Hospital Stay: Payer: Medicare Other

## 2018-01-30 DIAGNOSIS — C50911 Malignant neoplasm of unspecified site of right female breast: Secondary | ICD-10-CM

## 2018-01-30 DIAGNOSIS — C7800 Secondary malignant neoplasm of unspecified lung: Secondary | ICD-10-CM | POA: Insufficient documentation

## 2018-01-30 DIAGNOSIS — Z5111 Encounter for antineoplastic chemotherapy: Secondary | ICD-10-CM | POA: Diagnosis not present

## 2018-01-30 DIAGNOSIS — C7951 Secondary malignant neoplasm of bone: Secondary | ICD-10-CM

## 2018-01-30 DIAGNOSIS — C7971 Secondary malignant neoplasm of right adrenal gland: Secondary | ICD-10-CM | POA: Insufficient documentation

## 2018-01-30 DIAGNOSIS — C771 Secondary and unspecified malignant neoplasm of intrathoracic lymph nodes: Secondary | ICD-10-CM | POA: Diagnosis not present

## 2018-01-30 DIAGNOSIS — C50411 Malignant neoplasm of upper-outer quadrant of right female breast: Secondary | ICD-10-CM

## 2018-01-30 DIAGNOSIS — Z853 Personal history of malignant neoplasm of breast: Secondary | ICD-10-CM

## 2018-01-30 LAB — COMPREHENSIVE METABOLIC PANEL
ALT: 14 U/L (ref 0–44)
AST: 18 U/L (ref 15–41)
Albumin: 3.7 g/dL (ref 3.5–5.0)
Alkaline Phosphatase: 84 U/L (ref 38–126)
Anion gap: 9 (ref 5–15)
BUN: 14 mg/dL (ref 8–23)
CHLORIDE: 104 mmol/L (ref 98–111)
CO2: 26 mmol/L (ref 22–32)
Calcium: 9.2 mg/dL (ref 8.9–10.3)
Creatinine, Ser: 0.82 mg/dL (ref 0.44–1.00)
GFR calc Af Amer: >60 mL/min (ref >60)
Glucose, Bld: 93 mg/dL (ref 70–99)
POTASSIUM: 4.1 mmol/L (ref 3.5–5.1)
Sodium: 139 mmol/L (ref 135–145)
Total Bilirubin: 0.5 mg/dL (ref 0.3–1.2)
Total Protein: 7 g/dL (ref 6.5–8.1)

## 2018-01-30 LAB — CBC WITH DIFFERENTIAL/PLATELET
BASOS ABS: 0.1 10*3/uL (ref 0.0–0.1)
Basophils Relative: 3 %
EOS PCT: 3 %
Eosinophils Absolute: 0.1 10*3/uL (ref 0.0–0.5)
HCT: 35.9 % (ref 34.8–46.6)
Hemoglobin: 12.5 g/dL (ref 11.6–15.9)
LYMPHS PCT: 41 %
Lymphs Abs: 1 10*3/uL (ref 0.9–3.3)
MCH: 37.1 pg — ABNORMAL HIGH (ref 25.1–34.0)
MCHC: 34.9 g/dL (ref 31.5–36.0)
MCV: 106.3 fL — AB (ref 79.5–101.0)
Monocytes Absolute: 0.1 10*3/uL (ref 0.1–0.9)
Monocytes Relative: 5 %
Neutro Abs: 1.2 10*3/uL — ABNORMAL LOW (ref 1.5–6.5)
Neutrophils Relative %: 48 %
PLATELETS: 251 10*3/uL (ref 145–400)
RBC: 3.37 MIL/uL — ABNORMAL LOW (ref 3.70–5.45)
RDW: 14.5 % (ref 11.2–14.5)
WBC: 2.5 10*3/uL — ABNORMAL LOW (ref 3.9–10.3)

## 2018-01-30 MED ORDER — FULVESTRANT 250 MG/5ML IM SOLN
500.0000 mg | Freq: Once | INTRAMUSCULAR | Status: AC
  Administered 2018-01-30: 500 mg via INTRAMUSCULAR

## 2018-01-30 MED ORDER — FULVESTRANT 250 MG/5ML IM SOLN
INTRAMUSCULAR | Status: AC
  Filled 2018-01-30: qty 10

## 2018-01-30 NOTE — Patient Instructions (Signed)

## 2018-01-31 LAB — CANCER ANTIGEN 27.29: CAN 27.29: 72.7 U/mL — AB (ref 0.0–38.6)

## 2018-02-14 MED FILL — IBRANCE 75 MG CAPSULE: 75 | 28 days supply | Qty: 21 | Fill #2

## 2018-02-24 ENCOUNTER — Ambulatory Visit: Payer: Medicare Other

## 2018-02-25 ENCOUNTER — Ambulatory Visit (INDEPENDENT_AMBULATORY_CARE_PROVIDER_SITE_OTHER): Payer: Medicare Other

## 2018-02-25 DIAGNOSIS — Z23 Encounter for immunization: Secondary | ICD-10-CM

## 2018-02-27 ENCOUNTER — Telehealth: Payer: Self-pay | Admitting: Oncology

## 2018-02-27 ENCOUNTER — Inpatient Hospital Stay: Payer: Medicare Other

## 2018-02-27 ENCOUNTER — Inpatient Hospital Stay (HOSPITAL_BASED_OUTPATIENT_CLINIC_OR_DEPARTMENT_OTHER): Payer: Medicare Other | Admitting: Oncology

## 2018-02-27 VITALS — BP 136/78 | HR 80 | Temp 97.7°F | Resp 18 | Ht 62.0 in | Wt 125.3 lb

## 2018-02-27 DIAGNOSIS — C50411 Malignant neoplasm of upper-outer quadrant of right female breast: Secondary | ICD-10-CM | POA: Diagnosis not present

## 2018-02-27 DIAGNOSIS — C50911 Malignant neoplasm of unspecified site of right female breast: Secondary | ICD-10-CM

## 2018-02-27 DIAGNOSIS — C7800 Secondary malignant neoplasm of unspecified lung: Secondary | ICD-10-CM | POA: Diagnosis not present

## 2018-02-27 DIAGNOSIS — C7951 Secondary malignant neoplasm of bone: Secondary | ICD-10-CM

## 2018-02-27 DIAGNOSIS — R5383 Other fatigue: Secondary | ICD-10-CM | POA: Diagnosis not present

## 2018-02-27 DIAGNOSIS — Z853 Personal history of malignant neoplasm of breast: Secondary | ICD-10-CM

## 2018-02-27 DIAGNOSIS — C7971 Secondary malignant neoplasm of right adrenal gland: Secondary | ICD-10-CM

## 2018-02-27 DIAGNOSIS — Z5111 Encounter for antineoplastic chemotherapy: Secondary | ICD-10-CM | POA: Diagnosis not present

## 2018-02-27 DIAGNOSIS — Z170 Estrogen receptor positive status [ER+]: Secondary | ICD-10-CM | POA: Diagnosis not present

## 2018-02-27 DIAGNOSIS — C771 Secondary and unspecified malignant neoplasm of intrathoracic lymph nodes: Secondary | ICD-10-CM

## 2018-02-27 LAB — COMPREHENSIVE METABOLIC PANEL
ALBUMIN: 3.6 g/dL (ref 3.5–5.0)
ALK PHOS: 84 U/L (ref 38–126)
ALT: 14 U/L (ref 0–44)
ANION GAP: 9 (ref 5–15)
AST: 17 U/L (ref 15–41)
BUN: 18 mg/dL (ref 8–23)
CALCIUM: 9 mg/dL (ref 8.9–10.3)
CO2: 27 mmol/L (ref 22–32)
Chloride: 103 mmol/L (ref 98–111)
Creatinine, Ser: 0.9 mg/dL (ref 0.44–1.00)
GFR calc Af Amer: >60 mL/min (ref >60)
GLUCOSE: 86 mg/dL (ref 70–99)
POTASSIUM: 4 mmol/L (ref 3.5–5.1)
Sodium: 139 mmol/L (ref 135–145)
TOTAL PROTEIN: 7.1 g/dL (ref 6.5–8.1)
Total Bilirubin: 0.6 mg/dL (ref 0.3–1.2)

## 2018-02-27 LAB — CBC WITH DIFFERENTIAL/PLATELET
Abs Immature Granulocytes: 0 10*3/uL (ref 0.00–0.07)
BASOS ABS: 0 10*3/uL (ref 0.0–0.1)
Basophils Relative: 2 %
EOS ABS: 0.1 10*3/uL (ref 0.0–0.5)
EOS PCT: 5 %
HEMATOCRIT: 36.1 % (ref 36.0–46.0)
HEMOGLOBIN: 12.4 g/dL (ref 12.0–15.0)
Immature Granulocytes: 0 %
LYMPHS ABS: 0.9 10*3/uL (ref 0.7–4.0)
LYMPHS PCT: 35 %
MCH: 36.6 pg — ABNORMAL HIGH (ref 26.0–34.0)
MCHC: 34.3 g/dL (ref 30.0–36.0)
MCV: 106.5 fL — ABNORMAL HIGH (ref 80.0–100.0)
MONO ABS: 0.1 10*3/uL (ref 0.1–1.0)
Monocytes Relative: 5 %
Neutro Abs: 1.4 10*3/uL — ABNORMAL LOW (ref 1.7–7.7)
Neutrophils Relative %: 53 %
Platelets: 258 10*3/uL (ref 150–400)
RBC: 3.39 MIL/uL — ABNORMAL LOW (ref 3.87–5.11)
RDW: 13.6 % (ref 11.5–15.5)
WBC: 2.6 10*3/uL — ABNORMAL LOW (ref 4.0–10.5)
nRBC: 0 % (ref 0.0–0.2)

## 2018-02-27 MED ORDER — FULVESTRANT 250 MG/5ML IM SOLN
500.0000 mg | Freq: Once | INTRAMUSCULAR | Status: AC
  Administered 2018-02-27: 500 mg via INTRAMUSCULAR

## 2018-02-27 MED ORDER — FULVESTRANT 250 MG/5ML IM SOLN
INTRAMUSCULAR | Status: AC
  Filled 2018-02-27: qty 10

## 2018-02-27 NOTE — Progress Notes (Signed)
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 She continues on palbociblib, currently at 75 mg, daily 3 weeks on, 1 week off, whereas previously it was only on Mondays, Wednesdays, and Fridays. At this present appointment she is on Week 2 of this cycle. She still has energy with occasional fatigue. She feels her toes cramping primarily occurring when she drives and she immediately puts on shoes to flatten her feet, and stretching.   She also receives fulvestrant every 4 weeks, with a dose due today. She tolerates this well without any complications from the injections.   REVIEW OF SYSTEMS: Erica Young reports that she does not exercise . She has tempoary tooth implant that she will have in for a total of 4 month has to go to another dentist. She was given ani-biotics, medication made her stomach sick, denies diarrhea but her stomach felt heavy. She uses a stool softener daily. She has had recent issues with her insurance and she is meeting with an agent today. She denies unusual headaches, visual changes, nausea, vomiting, or dizziness. There has been no unusual cough, phlegm production, or pleurisy. This been no change in bowel or bladder habits. She  denies unexplained fatigue or unexplained weight loss, bleeding, rash, or fever. A detailed review of systems was otherwise stable.   Erica Young reports that she feels cramping in her toes.  She denies unusual headaches, visual changes, nausea, vomiting, or dizziness. There has been no unusual cough, phlegm production, or pleurisy. There has been no change in  bladder habits. She denies unexplained weight loss, bleeding, rash, or fever. A detailed review of systems was otherwise stable.      BREAST CANCER HISTORY: From the earlier summary note  Erica Young tells me in 2007 while living in [place] she was found to have a very small cancer in the upper-outer quadrant of the right breast, about the size of the P, noted on mammography. It was not palpable. She had a right lumpectomy and full sentinel lymph node sampling. She then received adjuvant radiation, to a total of 33 treatments. She received no systemic therapy.  She then did well until December 2014, when she had a fall and complain of pain in her left ribs. Rib films and chest x-ray on 04/13/2013 showed no fractures, but CT of the abdomen and pelvis on the same day found subcarinal and right hilar adenopathy. This was followed up with a chest CT scan 05/05/2013 confirming extensive mediastinal and right hilar lymphadenopathy, with multiple pulmonary nodules, the largest measuring 0.9 cm. PET scan 05/21/2013 showed hypermetabolic adenopathy in the right and left paratracheal areas, the precarinal, subcarinal and right hilar areas, but no involvement of the liver, and the lung nodules were not hypermetabolic (although they were below the level of reliable detection). In addition, at L5 there was a lucency measuring 1.2 cm.  The  PET scan also showed a hypermetabolic focus on the thyroid gland, which was evaluated with neck ultrasound and biopsy 97/98/9211, showing a follicular lesion of undetermined significance (([id number]).  The patient was referred to  pulmonary [Dr Melvyn Novas and Dr Julien Nordmann for further evaluation. Repeat PET scan 12/28/2013 showed, in addition to the adenopathy previously noted, now multiple bony metastases. On 01/07/2014 the patient underwent bronchoscopy and this showed ([id number], together with separate cytology] a low-grade mucinous invasive breast cancer, estrogen receptor 100% positive, progesterone receptor 14% positive, with no HER-2 amplification, the signals ratio being 1.30 and the number per cell 1.95.  The patient was started on anastrozole 01/22/2014; monthly denosumab/Xgeva was added 07/30/2014. She appeared to tolerate this well, and her CA-27-29 (127 at baseline) normalized. Most recent CT scans of chest abdomen and pelvis 10/03/2015 showed continuing response. Despite this good news however, the patient decided to go off anastrozole and denosumab/Xgeva as of June 2017. By the time she saw Dr. Earlie Server again in September 2017 her tumor marker had doubled. At that time the patient was referred to the breast clinic for a second opinion regarding further evaluation and treatment.   PAST MEDICAL HISTORY: Past Medical History:  Diagnosis Date  . Allergy   . Anxiety   . Bone cancer ([facility]) mri 11/11/14   right parietal bone,left cribriform plate metastases  . Cancer [facility]) 2007   right breat ca  , lumpectomy and radiation tx (declined chemo and additional prophylactic meds)  . Cataract    both eyes  10/2016 Lt.     04/2017 Rt.   . CEREBROVASCULAR DISEASE 03/03/2010  . Diverticulitis   . DIVERTICULOSIS, COLON 03/03/2010  . Fatty liver 08/02/09   as per U/S done by [facility]  . Foramen ovale    positive bubble study  . GERD (gastroesophageal reflux disease)   . Headache(784.0)    she thinks sinus headaches  . History of cerebral artery stenosis    right middle  . HYPERLIPIDEMIA 03/03/2010  . Hypertension   . HYPOTHYROIDISM 03/03/2010   no longer on meds  . Internal hemorrhoid   . Low back pain   .  Mastoiditis    noted on brain MRI  . Rib fracture   . Right leg pain   . Shortness of breath   . Stroke [facility]) Oct. 2011   TIA  . Ulcer     PAST SURGICAL HISTORY: Past Surgical History:  Procedure Laterality Date  . BREAST LUMPECTOMY     right  . CATARACT EXTRACTION Left    10/2016   Rt 04/2017  . CHOLECYSTECTOMY    . COLONOSCOPY    . POLYPECTOMY     benign cecum  . SHOULDER SURGERY     right shoulder (dislocation)  . TONSILLECTOMY    . TUBAL LIGATION    . UPPER GASTROINTESTINAL ENDOSCOPY    . VIDEO BRONCHOSCOPY WITH ENDOBRONCHIAL ULTRASOUND N/A 01/07/2014   Procedure: VIDEO BRONCHOSCOPY WITH ENDOBRONCHIAL ULTRASOUND;  Surgeon: Ivin Poot, MD;  Location: [facility] OR;  Service: Thoracic;  Laterality: N/A;    FAMILY HISTORY Family History  Problem Relation Age of Onset  . Heart disease Sister        cerebral vascular disease also  . Heart disease Brother        cerebral vascular disease also  . Heart disease Brother        cerebral vascular disease  . Heart disease Sister        cebreal vascular  disease also  . Heart disease Sister        cebreal vascular diease also  . Heart disease Sister        cerebral vascular diease also  . Stomach cancer Father   . Colon cancer Neg Hx   . Esophageal cancer Neg Hx   . Rectal cancer Neg Hx   . Colon polyps Neg Hx   . Asthma Neg Hx   The patient's father died from stomach cancer in his late 32s. The patient's mother died in her 51s from a stroke. The patient has 2 brothers, 4 sisters. One sister had leukemia. There is no history of breast, colon, or ovarian cancer in the family to her knowledge  GYNECOLOGIC HISTORY:  No LMP recorded. Patient is postmenopausal. Menarche age 40, first live birth age 52, the patient is Erica Young P2. She went through menopause in her late 27s. She took no hormone replacement. She took oral contraceptives for approximately 2 years remotely without complications.  SOCIAL HISTORY:  Erica Young is originally from  [place], [place]. She worked as a Education officer, museum, particularly, in the field of substance abuse. Her husband, Mortimer Fries, is a retired substance abuse Social worker. This is a second marriage for both of them. Erica Young has 2 children from her first marriage, Erica Young, lives in [place], and worked as a Audiological scientist but is now disabled, and Lawrence Santiago, who lives in [place] and works in Engineer, mining. The patient has 6 grandchildren. She attends [employer]Mikki Santee has 3 children from his first marriage. [name] Junior lives in [place] and has his own trucking business. He tells me he is estranged from his 2 daughters Amedeo Gory (a retired Pharmacist, hospital) and Salena Saner (who works in a bank). They are living on [place].   ADVANCED DIRECTIVES: in place   HEALTH MAINTENANCE: Social History   Tobacco Use  . Smoking status: Former Smoker    Packs/day: 0.50    Years: 20.00    Pack years: 10.00    Types: Cigarettes    Last attempt to quit: 05/01/1991    Years since quitting: 26.8  . Smokeless tobacco: Never Used  Substance Use Topics  . Alcohol use: No    Alcohol/week: 0.0 standard drinks  . Drug use: No     Colonoscopy:  PAP:  Bone density:   Allergies  Allergen Reactions  . Ciprofloxacin Anaphylaxis    Current Outpatient Medications  Medication Sig Dispense Refill  . ALPRAZolam (XANAX) 0.25 MG tablet TAKE 1 TABLET BY MOUTH THREE TIMES DAILY AS NEEDED 90 tablet 0  . aspirin EC 81 MG tablet Take 81 mg by mouth daily after breakfast.     . CARTIA XT 120 MG 24 hr capsule Take 1 capsule (120 mg total) by mouth daily. 90 capsule 0  . cetirizine (ZYRTEC) 10 MG tablet Take 10 mg by mouth daily.    . Cholecalciferol (VITAMIN D PO) Take 1,000 Units by mouth 2 (two) times daily.    Marland Kitchen docusate sodium (COLACE) 100 MG capsule Take 100 mg by mouth daily as needed for mild constipation.    . FULVESTRANT IM Inject into the muscle. Takes every 28 days    . Glucosamine 500 MG CAPS Take 500 mg by  mouth daily.     . meclizine (ANTIVERT) 25 MG tablet TAKE 1 TABLET(25 MG) BY MOUTH EVERY 6 HOURS AS NEEDED FOR DIZZINESS 60 tablet 0  . OVER THE COUNTER MEDICATION Beno for gas, takes twice daily    .  palbociclib (IBRANCE) 75 MG capsule Take 1 capsule (75 mg total) by mouth daily with breakfast. Take whole with food. Take for 21 days on, 7 days off, repeat every 28 days. 21 capsule 6  . Probiotic Product (PROBIOTIC DAILY PO) Take 1 tablet by mouth daily.      Current Facility-Administered Medications  Medication Dose Route Frequency Provider Last Rate Last Dose  . 0.9 %  sodium chloride infusion  500 mL Intravenous Once Armbruster, Carlota Raspberry, MD        OBJECTIVE: Middle-aged Latin American woman in no acute distress  Vitals:   02/27/18 0825  BP: 136/78  Pulse: 80  Resp: 18  Temp: 97.7 F (36.5 C)  SpO2: 100%     Body mass index is 22.92 kg/m.    ECOG FS:1 - Symptomatic but completely ambulatory  Sclerae unicteric, pupils round and equal Oropharynx clear and moist, status post recent implant left maxilla No cervical or supraclavicular adenopathy Lungs no rales or rhonchi Heart regular rate and rhythm Abd soft, nontender, positive bowel sounds MSK no focal spinal tenderness, no upper extremity lymphedema Neuro: nonfocal, well oriented, anxious affect Breasts: No masses or discharge noted in either breast.  Both axillae are benign    LAB RESULTS:  CMP     Component Value Date/Time   NA 139 01/30/2018 0830   NA 138 04/18/2017 0853   K 4.1 01/30/2018 0830   K 4.4 04/18/2017 0853   CL 104 01/30/2018 0830   CO2 26 01/30/2018 0830   CO2 29 04/18/2017 0853   GLUCOSE 93 01/30/2018 0830   GLUCOSE 95 04/18/2017 0853   BUN 14 01/30/2018 0830   BUN 16.6 04/18/2017 0853   CREATININE 0.82 01/30/2018 0830   CREATININE 0.8 04/18/2017 0853   CALCIUM 9.2 01/30/2018 0830   CALCIUM 9.2 04/18/2017 0853   PROT 7.0 01/30/2018 0830   PROT 7.0 04/18/2017 0853   ALBUMIN 3.7 01/30/2018  0830   ALBUMIN 3.8 04/18/2017 0853   AST 18 01/30/2018 0830   AST 17 04/18/2017 0853   ALT 14 01/30/2018 0830   ALT 16 04/18/2017 0853   ALKPHOS 84 01/30/2018 0830   ALKPHOS 90 04/18/2017 0853   BILITOT 0.5 01/30/2018 0830   BILITOT 0.63 04/18/2017 0853   GFRNONAA >60 01/30/2018 0830   GFRAA >60 01/30/2018 0830    INo results found for: SPEP, UPEP  Lab Results  Component Value Date   WBC 2.6 (L) 02/27/2018   NEUTROABS 1.4 (L) 02/27/2018   HGB 12.4 02/27/2018   HCT 36.1 02/27/2018   MCV 106.5 (H) 02/27/2018   PLT 258 02/27/2018      Chemistry      Component Value Date/Time   NA 139 01/30/2018 0830   NA 138 04/18/2017 0853   K 4.1 01/30/2018 0830   K 4.4 04/18/2017 0853   CL 104 01/30/2018 0830   CO2 26 01/30/2018 0830   CO2 29 04/18/2017 0853   BUN 14 01/30/2018 0830   BUN 16.6 04/18/2017 0853   CREATININE 0.82 01/30/2018 0830   CREATININE 0.8 04/18/2017 0853      Component Value Date/Time   CALCIUM 9.2 01/30/2018 0830   CALCIUM 9.2 04/18/2017 0853   ALKPHOS 84 01/30/2018 0830   ALKPHOS 90 04/18/2017 0853   AST 18 01/30/2018 0830   AST 17 04/18/2017 0853   ALT 14 01/30/2018 0830   ALT 16 04/18/2017 0853   BILITOT 0.5 01/30/2018 0830   BILITOT 0.63 04/18/2017 3267  Most recent CA-64-29 had risen to 57.6.  This is being repeated today  No components found for: LABCA125  No results for input(s): INR in the last 168 hours.  Urinalysis    Component Value Date/Time   COLORURINE STRAW (A) 11/19/2016 1750   APPEARANCEUR CLEAR 11/19/2016 1750   LABSPEC 1.003 (L) 11/19/2016 1750   PHURINE 7.0 11/19/2016 1750   GLUCOSEU NEGATIVE 11/19/2016 1750   HGBUR NEGATIVE 11/19/2016 1750   BILIRUBINUR NEGATIVE 11/19/2016 1750   BILIRUBINUR neg 02/16/2015 1318   KETONESUR NEGATIVE 11/19/2016 1750   PROTEINUR NEGATIVE 11/19/2016 1750   UROBILINOGEN 0.2 02/16/2015 1318   UROBILINOGEN 0.2 11/18/2013 1321   NITRITE NEGATIVE 11/19/2016 1750   LEUKOCYTESUR TRACE (A)  11/19/2016 1750     STUDIES: No results found.   ELIGIBLE FOR AVAILABLE RESEARCH PROTOCOL: no  ASSESSMENT: 77 y.o. [address] woman  (1) status post right breast upper outer quadrant lumpectomy and axillary lymph node dissection in 2007 for what appears to have been a T1 N0, stage IA invasive ductal carcinoma, treated adjuvantly with radiation (33 sessions)  METASTATIC DISEASE definitively documented Sept 2015 (2) bronchoscopic biopsy 01/07/2014 showed a low-grade mucinous breast cancer, strongly estrogen receptor positive, progesterone receptor positive, HER-2 negative; staging studies confirmed extensive hypermetabolic adenopathy, multiple bone lesions, likely early lung involvement, but no liver lesions.  (a) bone scan and chest CT 07/09/2016 shows stable disease.  (b) CA-27-29 is moderately informative  (c) bone scan and CT scan of the chest 04/24/2017 shows essentially stable disease  (3) on Arimidex between September 2015 and June 2017, with evidence of response; discontinued secondary to side effects  (a) bone density 04/10/2016 shows osteopenia with a T score of -2.1  (4) on monthly denosumab/Xgeva between 07/30/2014 and 10/04/2015, discontinued due to patient's concerns regarding osteonecrosis of the jaw  (a) discussed again October 2017, the patient adamantly refusing denosumab  (5) started fulvestrant 01/26/2016    (a) Palbociclib added at 75 mg M/W/F, beginning mid February 2018  (b) chest CT scan obtained 12/02/2017 shows small but measurable growth of lung lesions; bones are stable  (c) palbociclib dose increased to 75 mg daily 21/7 beginning 12/23/2017  PLAN Marli is doing remarkably well at the full dose of palbociclib, at 75 mg daily 21/7.  Her counts today are quite good.  She is extremely anxious because she says she was called by insurance and they want to change her insurance and right now insurance is paying for the Ibrance and fulvestrant, which are both  extremely expensive, and she thinks they want to knock her out so she will have to pay herself.  I am having our oral chemotherapy pharmacist meet with her briefly but I discussed with her and her husband that they do need to be extremely careful with any insurance changes because her costs are going to continue to be very high and she is doing quite well, could live quite a long time, and this could be more than they could afford if there insurance is arbitrarily changed to something less helpful.  Quite aside from all those issues she is going to see me again on March 26, 2018.  I will be her next Faslodex shot but also she will have a CT of the chest and a bone scan before that visit.  They know to call for any other issues that may develop before the next visit.  Hezzie Karim, Virgie Dad, MD  02/27/18 8:39 AM Medical Oncology and Hematology [address]  [place], [place] [address] Tel. [phone]    Fax. [phone]  Elie Goody, am acting as scribe for Chauncey Cruel MD.  By signing my name below, I, Samul Dada, attest that this documentation has been prepared under the direction and in the presence of Aubry Tucholski, Virgie Dad, MD. Electronically Signed: [place]. 02/27/18. 8:49 AM.  Lindie Spruce MD, have reviewed the above documentation for accuracy and completeness, and I agree with the above.

## 2018-02-27 NOTE — Telephone Encounter (Signed)
Gave avs and calendar ° °

## 2018-02-28 LAB — CANCER ANTIGEN 27.29: CAN 27.29: 72.2 U/mL — AB (ref 0.0–38.6)

## 2018-03-05 ENCOUNTER — Telehealth: Payer: Self-pay

## 2018-03-05 NOTE — Telephone Encounter (Signed)
[name] Encounter  I was successful at securing a grant with Patient [facility]) for $5500. This will keep the out of pocket expense for Ibrance at $0. The grant information is as follows and has been shared with [facility].  Approval dates: 12/03/2017-03/03/2019 ID: [id number] Group: [id number] BIN: [id number] PCN: [id number]  I called the patient and left a voicemail for her to return my call at her convenience.   Erica Young Patient [place] Phone [phone] Fax [phone]

## 2018-03-12 ENCOUNTER — Other Ambulatory Visit: Payer: Self-pay | Admitting: Internal Medicine

## 2018-03-13 MED FILL — IBRANCE 75 MG CAPSULE: 75 | 28 days supply | Qty: 21 | Fill #3

## 2018-03-13 NOTE — Telephone Encounter (Signed)
Pt calling to check on this.  States that she will be out today.

## 2018-03-14 NOTE — Telephone Encounter (Signed)
[place] for refill? Please advise

## 2018-03-14 NOTE — Telephone Encounter (Signed)
Pt calling to check status. Pt is out. Please advise [phone]

## 2018-03-17 ENCOUNTER — Encounter (HOSPITAL_COMMUNITY)
Admission: RE | Admit: 2018-03-17 | Discharge: 2018-03-17 | Disposition: A | Discharge status: Home / Self Care | Payer: Medicare Other | Source: Ambulatory Visit | Attending: Oncology | Admitting: Oncology

## 2018-03-17 ENCOUNTER — Ambulatory Visit (HOSPITAL_COMMUNITY)
Admission: RE | Admit: 2018-03-17 | Discharge: 2018-03-17 | Disposition: A | Discharge status: Home / Self Care | Payer: Medicare Other | Source: Ambulatory Visit | Attending: Oncology | Admitting: Oncology

## 2018-03-17 ENCOUNTER — Telehealth: Payer: Self-pay | Admitting: *Deleted

## 2018-03-17 DIAGNOSIS — I700 Atherosclerosis of aorta: Secondary | ICD-10-CM | POA: Diagnosis not present

## 2018-03-17 DIAGNOSIS — C50919 Malignant neoplasm of unspecified site of unspecified female breast: Secondary | ICD-10-CM | POA: Diagnosis not present

## 2018-03-17 DIAGNOSIS — M899 Disorder of bone, unspecified: Secondary | ICD-10-CM | POA: Diagnosis not present

## 2018-03-17 DIAGNOSIS — R590 Localized enlarged lymph nodes: Secondary | ICD-10-CM | POA: Insufficient documentation

## 2018-03-17 DIAGNOSIS — R918 Other nonspecific abnormal finding of lung field: Secondary | ICD-10-CM | POA: Insufficient documentation

## 2018-03-17 DIAGNOSIS — C7971 Secondary malignant neoplasm of right adrenal gland: Secondary | ICD-10-CM | POA: Insufficient documentation

## 2018-03-17 DIAGNOSIS — C50411 Malignant neoplasm of upper-outer quadrant of right female breast: Secondary | ICD-10-CM

## 2018-03-17 DIAGNOSIS — C7970 Secondary malignant neoplasm of unspecified adrenal gland: Secondary | ICD-10-CM | POA: Diagnosis not present

## 2018-03-17 DIAGNOSIS — Z170 Estrogen receptor positive status [ER+]: Secondary | ICD-10-CM | POA: Diagnosis not present

## 2018-03-17 DIAGNOSIS — C50911 Malignant neoplasm of unspecified site of right female breast: Secondary | ICD-10-CM | POA: Diagnosis not present

## 2018-03-17 MED ORDER — TECHNETIUM TC 99M MEDRONATE IV KIT
20.0000 | PACK | Freq: Once | INTRAVENOUS | Status: AC | PRN
  Administered 2018-03-17: 22 via INTRAVENOUS

## 2018-03-17 MED ORDER — SODIUM CHLORIDE (PF) 0.9 % IJ SOLN
INTRAMUSCULAR | Status: AC
  Filled 2018-03-17: qty 50

## 2018-03-17 MED ORDER — FLUDEOXYGLUCOSE F - 18 (FDG) INJECTION: 22.0000 | Freq: Once | INTRAVENOUS | Status: DC | PRN

## 2018-03-17 MED ORDER — IOHEXOL 300 MG/ML  SOLN
75.0000 mL | Freq: Once | INTRAMUSCULAR | Status: AC | PRN
  Administered 2018-03-17: 75 mL via INTRAVENOUS

## 2018-03-17 NOTE — Progress Notes (Signed)
Established Patient Office Visit     CC/Reason for Visit: To establish care with me as her new PCP, medication refill as well as sinus congestion  HPI: Erica Young is a 77 y.o. female who is coming in today for the above mentioned reasons. Past Medical History is significant for: HTN, anxiety, HLD and metastatic breast cancer followed by Dr. Jana Hakim.  She states that for the past at least 2 months has been having nasal congestion, postnasal drip, sore throat, occasional headaches.  "My sinuses are killing me".  She has not had fevers, purulent nasal discharge.  She tried some Allegra samples without relief, used some kind of intranasal sprays prescribed by an allergist without relief as well.  She does not recall the name Flonase.  She denies cough, has had some associated left-sided ear pain with some popping.   Past Medical/Surgical History: Past Medical History:  Diagnosis Date  . Allergy   . Anxiety   . Bone cancer ([place]) mri 11/11/14   right parietal bone,left cribriform plate metastases  . Cancer [facility]) 2007   right breat ca  , lumpectomy and radiation tx (declined chemo and additional prophylactic meds)  . Cataract    both eyes  10/2016 Lt.     04/2017 Rt.   . CEREBROVASCULAR DISEASE 03/03/2010  . Diverticulitis   . DIVERTICULOSIS, COLON 03/03/2010  . Fatty liver 08/02/09   as per U/S done by [facility]  . Foramen ovale    positive bubble study  . GERD (gastroesophageal reflux disease)   . Headache(784.0)    she thinks sinus headaches  . History of cerebral artery stenosis    right middle  . HYPERLIPIDEMIA 03/03/2010  . Hypertension   . HYPOTHYROIDISM 03/03/2010   no longer on meds  . Internal hemorrhoid   . Low back pain   . Mastoiditis    noted on brain MRI  . Rib fracture   . Right leg pain   . Shortness of breath   . Stroke [facility]) Oct. 2011   TIA  . Ulcer     Past Surgical History:  Procedure Laterality Date  . BREAST LUMPECTOMY     right    . CATARACT EXTRACTION Left    10/2016   Rt 04/2017  . CHOLECYSTECTOMY    . COLONOSCOPY    . POLYPECTOMY     benign cecum  . SHOULDER SURGERY     right shoulder (dislocation)  . TONSILLECTOMY    . TUBAL LIGATION    . UPPER GASTROINTESTINAL ENDOSCOPY    . VIDEO BRONCHOSCOPY WITH ENDOBRONCHIAL ULTRASOUND N/A 01/07/2014   Procedure: VIDEO BRONCHOSCOPY WITH ENDOBRONCHIAL ULTRASOUND;  Surgeon: Ivin Poot, MD;  Location: [place];  Service: Thoracic;  Laterality: N/A;    Social History:  reports that she quit smoking about 26 years ago. Her smoking use included cigarettes. She has a 10.00 pack-year smoking history. She has never used smokeless tobacco. She reports that she does not drink alcohol or use drugs.  Allergies: Allergies  Allergen Reactions  . Ciprofloxacin Anaphylaxis    Family History:  Family History  Problem Relation Age of Onset  . Heart disease Sister        cerebral vascular disease also  . Heart disease Brother        cerebral vascular disease also  . Heart disease Brother        cerebral vascular disease  . Heart disease Sister  cebreal vascular disease also  . Heart disease Sister        cebreal vascular diease also  . Heart disease Sister        cerebral vascular diease also  . Stomach cancer Father   . Colon cancer Neg Hx   . Esophageal cancer Neg Hx   . Rectal cancer Neg Hx   . Colon polyps Neg Hx   . Asthma Neg Hx      Current Outpatient Medications:  .  ALPRAZolam (XANAX) 0.25 MG tablet, Take 1 tablet (0.25 mg total) by mouth 3 (three) times daily as needed., Disp: 90 tablet, Rfl: 0 .  aspirin EC 81 MG tablet, Take 81 mg by mouth daily after breakfast. , Disp: , Rfl:  .  CARTIA XT 120 MG 24 hr capsule, Take 1 capsule (120 mg total) by mouth daily., Disp: 90 capsule, Rfl: 0 .  cetirizine (ZYRTEC) 10 MG tablet, Take 10 mg by mouth daily., Disp: , Rfl:  .  Cholecalciferol (VITAMIN D PO), Take 1,000 Units by mouth 2 (two) times daily., Disp: ,  Rfl:  .  docusate sodium (COLACE) 100 MG capsule, Take 100 mg by mouth daily as needed for mild constipation., Disp: , Rfl:  .  FULVESTRANT IM, Inject into the muscle. Takes every 28 days, Disp: , Rfl:  .  Glucosamine 500 MG CAPS, Take 500 mg by mouth daily. , Disp: , Rfl:  .  OVER THE COUNTER MEDICATION, [place] for gas, takes twice daily, Disp: , Rfl:  .  palbociclib (IBRANCE) 75 MG capsule, Take 1 capsule (75 mg total) by mouth daily with breakfast. Take whole with food. Take for 21 days on, 7 days off, repeat every 28 days., Disp: 21 capsule, Rfl: 6 .  Probiotic Product (PROBIOTIC DAILY PO), Take 1 tablet by mouth daily. , Disp: , Rfl:  .  fluticasone (FLONASE) 50 MCG/ACT nasal spray, Place 2 sprays into both nostrils daily., Disp: 16 g, Rfl: 1 .  loratadine (CLARITIN) 10 MG tablet, Take 1 tablet (10 mg total) by mouth daily., Disp: 30 tablet, Rfl: 11  Current Facility-Administered Medications:  .  0.9 %  sodium chloride infusion, 500 mL, Intravenous, Once, Armbruster, Carlota Raspberry, MD  Review of Systems:  Constitutional: Denies fever, chills, diaphoresis, appetite change and fatigue.  HEENT: Denies photophobia,  sneezing, mouth sores, trouble swallowing, neck pain, neck stiffness and tinnitus.   Respiratory: Denies SOB, DOE, cough, chest tightness,  and wheezing.   Cardiovascular: Denies chest pain, palpitations and leg swelling.  Gastrointestinal: Denies nausea, vomiting, abdominal pain, diarrhea, constipation, blood in stool and abdominal distention.  Endocrine: Denies: hot or cold intolerance, sweats, changes in hair or nails, polyuria, polydipsia. Musculoskeletal: Denies myalgias, back pain, joint swelling, arthralgias and gait problem.  Skin: Denies pallor, rash and wound.  Neurological: Denies dizziness, seizures, syncope, weakness, light-headedness, numbness and headaches.  Hematological: Denies adenopathy. Easy bruising, personal or family bleeding history  Psychiatric/Behavioral:  Denies suicidal ideation, mood changes, confusion, nervousness, sleep disturbance and agitation    Physical Exam: Vitals:   03/18/18 1059  BP: 112/70  Pulse: 73  Temp: (!) 97.5 F (36.4 C)  TempSrc: Oral  SpO2: 96%  Weight: 125 lb 6.4 oz (56.9 kg)    Body mass index is 22.94 kg/m.   Constitutional: NAD, calm, comfortable Eyes: PERRL, lids and conjunctivae normal ENMT: Mucous membranes are moist. Posterior pharynx erythematous without exudates.  Normal dentition. Tympanic membrane is pearly white, no erythema or bulging on the left,  unable to visualize right due to cerumen impaction. Neck: normal, supple, no masses, no thyromegaly Respiratory: clear to auscultation bilaterally, no wheezing, no crackles. Normal respiratory effort. No accessory muscle use.  Cardiovascular: Regular rate and rhythm, no murmurs / rubs / gallops. No extremity edema. 2+ pedal pulses. No carotid bruits.  Abdomen: no tenderness, no masses palpated. No hepatosplenomegaly. Bowel sounds positive.  Musculoskeletal: no clubbing / cyanosis. No joint deformity upper and lower extremities. Good ROM, no contractures. Normal muscle tone.  Skin: no rashes, lesions, ulcers. No induration Neurologic: CN 2-12 grossly intact. Sensation intact, DTR normal. Strength 5/5 in all 4.  Psychiatric: Normal judgment and insight. Alert and oriented x 3. Normal mood.    Impression and Plan:  Sinus congestion  -Have advised daily use of antihistamines like Claritin or Zyrtec, in addition to prescription Flonase that she will use daily. -She has been told that she may use saline nasal spray if necessary as well.  Dyslipidemia -LDL 86 in 7/19. -Not on meds.  Essential hypertension -Well-controlled. -On diltiazem 120.  Malignant neoplasm of upper-outer quadrant of right breast in female, estrogen receptor positive ([place]) Bone metastases ([place]) -Followed by Dr. Jana Hakim.    Patient Instructions  -Make sure you take your  Claritin daily. -You have also been given a prescription for Flonase today that you will use 2 sprays in each nostril once a day. -Can also get over-the-counter saline nasal spray to use as needed.       Lelon Frohlich, MD [facility]

## 2018-03-17 NOTE — Telephone Encounter (Signed)
Pt came by office today asking if whole body scan includes the head & states that she is having a problem with her palate & wants checked.  Informed that this should be seen but to ask radiology & if not to call & speak with Dr Jana Hakim.  She wanted a copy of her med list which was given & she also states that she started her Ibrance yest instead of today.  Informed OK but still just take for 21 days & may hold for 8 days to get back on her Monday schedule.

## 2018-03-18 ENCOUNTER — Ambulatory Visit (INDEPENDENT_AMBULATORY_CARE_PROVIDER_SITE_OTHER): Payer: Medicare Other | Admitting: Internal Medicine

## 2018-03-18 ENCOUNTER — Encounter: Payer: Self-pay | Admitting: Internal Medicine

## 2018-03-18 ENCOUNTER — Other Ambulatory Visit: Payer: Self-pay | Admitting: Internal Medicine

## 2018-03-18 VITALS — BP 112/70 | HR 73 | Temp 97.5°F | Wt 125.4 lb

## 2018-03-18 DIAGNOSIS — R0981 Nasal congestion: Secondary | ICD-10-CM

## 2018-03-18 DIAGNOSIS — E785 Hyperlipidemia, unspecified: Secondary | ICD-10-CM

## 2018-03-18 DIAGNOSIS — I10 Essential (primary) hypertension: Secondary | ICD-10-CM | POA: Diagnosis not present

## 2018-03-18 DIAGNOSIS — Z170 Estrogen receptor positive status [ER+]: Secondary | ICD-10-CM

## 2018-03-18 DIAGNOSIS — C7951 Secondary malignant neoplasm of bone: Secondary | ICD-10-CM | POA: Diagnosis not present

## 2018-03-18 DIAGNOSIS — C50411 Malignant neoplasm of upper-outer quadrant of right female breast: Secondary | ICD-10-CM

## 2018-03-18 MED ORDER — FLUTICASONE PROPIONATE 50 MCG/ACT NA SUSP: 2.0000 | Freq: Every day | NASAL | 1 refills | Status: DC

## 2018-03-18 MED ORDER — ALPRAZOLAM 0.25 MG PO TABS: 0.2500 mg | ORAL_TABLET | Freq: Three times a day (TID) | ORAL | 0 refills | Status: DC | PRN

## 2018-03-18 MED ORDER — LORATADINE 10 MG PO TABS: 10.0000 mg | ORAL_TABLET | Freq: Every day | ORAL | 11 refills | Status: DC

## 2018-03-18 NOTE — Patient Instructions (Signed)
-  Make sure you take your Claritin daily. -You have also been given a prescription for Flonase today that you will use 2 sprays in each nostril once a day. -Can also get over-the-counter saline nasal spray to use as needed.

## 2018-03-25 NOTE — Progress Notes (Signed)
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 She is accompanied by her husband.  The patient continues on Palbociblib/Ibrance, three weeks on, one week off, and is tolerating it well. After her last cycle, she began her next one a day early, only taking six days between her cycles rather than the seven day recommendation.   She also continues Fulvestrant, due every four weeks, with her last dose on 02/27/2018. Her next dose is due on 03/27/2018.  She tolerates this well.  Since her last visit here, she underwent a CT Chest with contrast on 03/17/2018 that showed: Mediastinal and right hilar adenopathy is stable. Right adrenal metastasis is stable. Clustered poorly marginated subsolid nodularity in the posterior right upper lobe is mildly increased, potentially mild metastatic progression. Additional scattered small bilateral pulmonary nodules are stable to minimally increased. Sclerotic T8 and L2 vertebral osseous lesions are stable. No new osseous lesions.  She also underwent a Whole Body Bone Scan on 03/17/2018, showing Multiple sites of abnormal osseous tracer accumulation identified consistent with osseous metastatic disease as above.  Questionable new subtle focus of increased uptake at the posterior leftr ninth rib versus artifact.   REVIEW OF SYSTEMS: Erica Young is feeling well overall. For Thanksgiving, her and her husband are staying home and cooking for themselves. The patient denies unusual headaches, visual changes, nausea, vomiting, or dizziness. There has been no unusual cough, phlegm production, or pleurisy. This been no change in bowel or bladder habits. The patient denies unexplained fatigue or unexplained weight loss, bleeding, rash, or fever. A detailed review of systems was otherwise noncontributory.     BREAST CANCER HISTORY: From the earlier summary note  Erica Young tells me in 2007 while living in [place] she was found to have a very small cancer in the upper-outer quadrant of the right breast, about the size of the P, noted on mammography. It was not palpable. She had a right lumpectomy and full sentinel lymph node sampling. She then received adjuvant radiation, to a total of 33 treatments. She received no systemic therapy.  She then did well until December 2014, when she had a fall and complain of pain in her left ribs. Rib films and chest x-ray on 04/13/2013 showed no fractures, but CT of the abdomen and pelvis on the same day found subcarinal and right hilar adenopathy. This was followed up with a chest CT scan 05/05/2013 confirming extensive mediastinal and right hilar lymphadenopathy, with multiple pulmonary nodules, the largest measuring 0.9 cm. PET scan 05/21/2013 showed hypermetabolic adenopathy in the right and left paratracheal areas, the precarinal, subcarinal and right hilar areas, but no involvement of the liver, and the lung nodules were not hypermetabolic (although they were below the level of reliable detection). In addition, at L5 there was a lucency measuring 1.2 cm.  The PET scan also showed a hypermetabolic focus on the  thyroid gland, which was evaluated with neck ultrasound and biopsy 57/32/2025,  showing a follicular lesion of undetermined significance (([id number]).  The patient was referred to pulmonary [Dr Melvyn Novas and Dr Julien Nordmann for further evaluation. Repeat PET scan 12/28/2013 showed, in addition to the adenopathy previously noted, now multiple bony metastases. On 01/07/2014 the patient underwent bronchoscopy and this showed ([id number], together with separate cytology] a low-grade mucinous invasive breast cancer, estrogen receptor 100% positive, progesterone receptor 14% positive, with no HER-2 amplification, the signals ratio being 1.30 and the number per cell 1.95.  The patient was started on anastrozole 01/22/2014; monthly denosumab/Xgeva was added 07/30/2014. She appeared to tolerate this well, and her CA-27-29 (127 at baseline) normalized. Most recent CT scans of chest abdomen and pelvis 10/03/2015 showed continuing response. Despite this good news however, the patient decided to go off anastrozole and denosumab/Xgeva as of June 2017. By the time she saw Dr. Earlie Server again in September 2017 her tumor marker had doubled. At that time the patient was referred to the breast clinic for a second opinion regarding further evaluation and treatment.   PAST MEDICAL HISTORY: Past Medical History:  Diagnosis Date  . Allergy   . Anxiety   . Bone cancer ([place]) mri 11/11/14   right parietal bone,left cribriform plate metastases  . Cancer [facility]) 2007   right breat ca  , lumpectomy and radiation tx (declined chemo and additional prophylactic meds)  . Cataract    both eyes  10/2016 Lt.     04/2017 Rt.   . CEREBROVASCULAR DISEASE 03/03/2010  . Diverticulitis   . DIVERTICULOSIS, COLON 03/03/2010  . Fatty liver 08/02/09   as per U/S done by [facility]  . Foramen ovale    positive bubble study  . GERD (gastroesophageal reflux disease)   . Headache(784.0)    she thinks sinus headaches  . History of cerebral artery stenosis    right middle  . HYPERLIPIDEMIA 03/03/2010  . Hypertension     . HYPOTHYROIDISM 03/03/2010   no longer on meds  . Internal hemorrhoid   . Low back pain   . Mastoiditis    noted on brain MRI  . Rib fracture   . Right leg pain   . Shortness of breath   . Stroke [facility]) Oct. 2011   TIA  . Ulcer     PAST SURGICAL HISTORY: Past Surgical History:  Procedure Laterality Date  . BREAST LUMPECTOMY     right  . CATARACT EXTRACTION Left    10/2016   Rt 04/2017  . CHOLECYSTECTOMY    . COLONOSCOPY    . POLYPECTOMY     benign cecum  . SHOULDER SURGERY     right shoulder (dislocation)  . TONSILLECTOMY    . TUBAL LIGATION    . UPPER GASTROINTESTINAL ENDOSCOPY    . VIDEO BRONCHOSCOPY WITH ENDOBRONCHIAL ULTRASOUND N/A 01/07/2014   Procedure: VIDEO BRONCHOSCOPY WITH ENDOBRONCHIAL ULTRASOUND;  Surgeon: Ivin Poot, MD;  [facility];  Service: Thoracic;  Laterality: N/A;    FAMILY HISTORY Family History  Problem Relation Age of Onset  . Heart disease Sister        cerebral vascular disease also  . Heart disease Brother        cerebral vascular disease also  . Heart disease Brother        cerebral vascular disease  . Heart disease Sister        cebreal vascular disease also  . Heart disease Sister  cebreal vascular diease also  . Heart disease Sister        cerebral vascular diease also  . Stomach cancer Father   . Colon cancer Neg Hx   . Esophageal cancer Neg Hx   . Rectal cancer Neg Hx   . Colon polyps Neg Hx   . Asthma Neg Hx   The patient's father died from stomach cancer in his late 30s. The patient's mother died in her 34s from a stroke. The patient has 2 brothers, 4 sisters. One sister had leukemia. There is no history of breast, colon, or ovarian cancer in the family to her knowledge  GYNECOLOGIC HISTORY:  No LMP recorded. Patient is postmenopausal. Menarche age 89, first live birth age 49, the patient is [place] P2. She went through menopause in her late 55s. She took no hormone replacement. She took oral contraceptives for  approximately 2 years remotely without complications.  SOCIAL HISTORY:  Erica Young is originally from [place], [place]. She worked as a Education officer, museum, particularly, in the field of substance abuse. Her husband, Erica Young, is a retired substance abuse Social worker. This is a second marriage for both of them. Erica Young has 2 children from her first marriage, Erica Young, lives in [place], and worked as a Audiological scientist but is now disabled, and Erica Young, who lives in [place] and works in Engineer, mining. The patient has 6 grandchildren. She attends [employer].--Erica Young has 3 children from his first marriage. Erica Young lives in [place] and has his own trucking business. He tells me he is estranged from his 2 daughters Erica Young (a retired Pharmacist, hospital) and Erica Young (who works in a bank). They are living on [place].   ADVANCED DIRECTIVES: in place   HEALTH MAINTENANCE: Social History   Tobacco Use  . Smoking status: Former Smoker    Packs/day: 0.50    Years: 20.00    Pack years: 10.00    Types: Cigarettes    Last attempt to quit: 05/01/1991    Years since quitting: 26.9  . Smokeless tobacco: Never Used  Substance Use Topics  . Alcohol use: No    Alcohol/week: 0.0 standard drinks  . Drug use: No     Colonoscopy:  PAP:  Bone density:   Allergies  Allergen Reactions  . Ciprofloxacin Anaphylaxis    Current Outpatient Medications  Medication Sig Dispense Refill  . ALPRAZolam (XANAX) 0.25 MG tablet Take 1 tablet (0.25 mg total) by mouth 3 (three) times daily as needed. 90 tablet 0  . aspirin EC 81 MG tablet Take 81 mg by mouth daily after breakfast.     . CARTIA XT 120 MG 24 hr capsule Take 1 capsule (120 mg total) by mouth daily. 90 capsule 0  . cetirizine (ZYRTEC) 10 MG tablet Take 10 mg by mouth daily.    . Cholecalciferol (VITAMIN D PO) Take 1,000 Units by mouth 2 (two) times daily.    Marland Kitchen docusate sodium (COLACE) 100 MG capsule Take 100 mg by mouth daily as needed for mild  constipation.    . fluticasone (FLONASE) 50 MCG/ACT nasal spray Place 2 sprays into both nostrils daily. 16 g 1  . FULVESTRANT IM Inject into the muscle. Takes every 28 days    . Glucosamine 500 MG CAPS Take 500 mg by mouth daily.     Marland Kitchen loratadine (CLARITIN) 10 MG tablet Take 1 tablet (10 mg total) by mouth daily. 30 tablet 11  . OVER THE COUNTER MEDICATION Beno for gas, takes  twice daily    . palbociclib (IBRANCE) 75 MG capsule Take 1 capsule (75 mg total) by mouth daily with breakfast. Take whole with food. Take for 21 days on, 7 days off, repeat every 28 days. 21 capsule 6  . Probiotic Product (PROBIOTIC DAILY PO) Take 1 tablet by mouth daily.      Current Facility-Administered Medications  Medication Dose Route Frequency Provider Last Rate Last Dose  . 0.9 %  sodium chloride infusion  500 mL Intravenous Once Armbruster, Carlota Raspberry, MD        OBJECTIVE: Middle-aged Latin American woman who appears stated age  73:   03/26/18 1145  BP: 132/66  Pulse: 72  Resp: 18  Temp: 98 F (36.7 C)  SpO2: 97%     Body mass index is 23.06 kg/m.    ECOG FS:1 - Symptomatic but completely ambulatory  Sclerae unicteric, EOMs intact Oropharynx clear and moist No cervical or supraclavicular adenopathy Lungs no rales or rhonchi Heart regular rate and rhythm Abd soft, nontender, positive bowel sounds MSK no focal spinal tenderness, no upper extremity lymphedema Neuro: nonfocal, well oriented, appropriate affect Breasts: Deferred     LAB RESULTS:  CMP     Component Value Date/Time   NA 139 03/26/2018 1133   NA 138 04/18/2017 0853   K 4.4 03/26/2018 1133   K 4.4 04/18/2017 0853   CL 105 03/26/2018 1133   CO2 23 03/26/2018 1133   CO2 29 04/18/2017 0853   GLUCOSE 91 03/26/2018 1133   GLUCOSE 95 04/18/2017 0853   BUN 19 03/26/2018 1133   BUN 16.6 04/18/2017 0853   CREATININE 0.79 03/26/2018 1133   CREATININE 0.8 04/18/2017 0853   CALCIUM 9.0 03/26/2018 1133   CALCIUM 9.2 04/18/2017  0853   PROT 7.1 03/26/2018 1133   PROT 7.0 04/18/2017 0853   ALBUMIN 3.8 03/26/2018 1133   ALBUMIN 3.8 04/18/2017 0853   AST 18 03/26/2018 1133   AST 17 04/18/2017 0853   ALT 15 03/26/2018 1133   ALT 16 04/18/2017 0853   ALKPHOS 75 03/26/2018 1133   ALKPHOS 90 04/18/2017 0853   BILITOT 0.4 03/26/2018 1133   BILITOT 0.63 04/18/2017 0853   GFRNONAA >60 03/26/2018 1133   GFRAA >60 03/26/2018 1133    INo results found for: SPEP, UPEP  Lab Results  Component Value Date   WBC 3.2 (L) 03/26/2018   NEUTROABS PENDING 03/26/2018   HGB 12.9 03/26/2018   HCT 37.3 03/26/2018   MCV 106.9 (H) 03/26/2018   PLT 241 03/26/2018      Chemistry      Component Value Date/Time   NA 139 03/26/2018 1133   NA 138 04/18/2017 0853   K 4.4 03/26/2018 1133   K 4.4 04/18/2017 0853   CL 105 03/26/2018 1133   CO2 23 03/26/2018 1133   CO2 29 04/18/2017 0853   BUN 19 03/26/2018 1133   BUN 16.6 04/18/2017 0853   CREATININE 0.79 03/26/2018 1133   CREATININE 0.8 04/18/2017 0853      Component Value Date/Time   CALCIUM 9.0 03/26/2018 1133   CALCIUM 9.2 04/18/2017 0853   ALKPHOS 75 03/26/2018 1133   ALKPHOS 90 04/18/2017 0853   AST 18 03/26/2018 1133   AST 17 04/18/2017 0853   ALT 15 03/26/2018 1133   ALT 16 04/18/2017 0853   BILITOT 0.4 03/26/2018 1133   BILITOT 0.63 04/18/2017 0853      Most recent CA-27-29 had risen to 57.6.  This is being repeated today  No components found for: LABCA125  No results for input(s): INR in the last 168 hours.  Urinalysis    Component Value Date/Time   COLORURINE STRAW (A) 11/19/2016 1750   APPEARANCEUR CLEAR 11/19/2016 1750   LABSPEC 1.003 (L) 11/19/2016 1750   PHURINE 7.0 11/19/2016 1750   GLUCOSEU NEGATIVE 11/19/2016 1750   HGBUR NEGATIVE 11/19/2016 1750   BILIRUBINUR NEGATIVE 11/19/2016 1750   BILIRUBINUR neg 02/16/2015 1318   KETONESUR NEGATIVE 11/19/2016 1750   PROTEINUR NEGATIVE 11/19/2016 1750   UROBILINOGEN 0.2 02/16/2015 1318    UROBILINOGEN 0.2 11/18/2013 1321   NITRITE NEGATIVE 11/19/2016 1750   LEUKOCYTESUR TRACE (A) 11/19/2016 1750     STUDIES: Ct Chest W Contrast  Result Date: 03/17/2018 CLINICAL DATA:  Metastatic right breast cancer, presenting for restaging. EXAM: CT CHEST WITH CONTRAST TECHNIQUE: Multidetector CT imaging of the chest was performed during intravenous contrast administration. CONTRAST:  48m OMNIPAQUE IOHEXOL 300 MG/ML  SOLN COMPARISON:  12/02/2017 chest CT. FINDINGS: Cardiovascular: Normal heart size. No significant pericardial effusion/thickening. Left anterior descending coronary atherosclerosis. Atherosclerotic nonaneurysmal thoracic aorta. Normal caliber pulmonary arteries. No central pulmonary emboli. Mediastinum/Nodes: No discrete thyroid nodules. Unremarkable esophagus. Surgical clips are again noted in the right axilla. No axillary adenopathy. Enlarged 0.7 cm right internal mammary node (series 2/image 32), previously 0.7 cm using similar measurement technique, stable. Right paratracheal adenopathy measuring up to 1.6 cm (series 2/image 25), previously 1.6 cm using similar measurement technique, stable. Enlarged 1.8 cm subcarinal node (series 2/image 54), previously 1.8 cm using similar measurement technique, stable. Stable 1.6 cm enlarged left paratracheal node (series 2/image 45). Stable enlarged 1.7 cm right hilar node. No new pathologically enlarged mediastinal nodes. No pathologically enlarged left hilar nodes. Lungs/Pleura: No pneumothorax. No pleural effusion. Clustered poorly marginated subsolid nodularity in the posterior right upper lobe is increased, largest 1.1 cm (series 7/image 21), increased from 0.7 cm. Solid peripheral left upper lobe 1.2 x 0.8 cm pulmonary nodule (series 7/image 70), previously 1.1 x 0.7 cm, stable to minimally increased. A few stable scattered solid right lower lobe pulmonary nodules, largest 8 x 5 mm (series 7/image 66), previously 8 x 6 mm using similar  measurement technique, not appreciably changed. Apical right upper lobe 3 mm nodule (series 7/image 17) is stable. Solid 2 mm left lower lobe pulmonary nodule (series 7/image 106) is stable. No additional new significant pulmonary nodules. Upper abdomen: Small hiatal hernia. Hypodense 1.1 cm peripheral right liver lobe lesion (series 2/image 110) is stable. Right adrenal 2.4 cm mass (series 2/image 121), not appreciably changed, previously 2.3 cm. Exophytic hypodense 1.3 cm posterior upper right renal cortical lesion is stable. Musculoskeletal: Faint sclerotic T8 vertebral lesion is stable. Partially visualized sclerotic L2 vertebral lesion appears stable. No new focal osseous lesions. Mild thoracic spondylosis. IMPRESSION: 1. Mediastinal and right hilar adenopathy is stable. 2. Right adrenal metastasis is stable. 3. Clustered poorly marginated subsolid nodularity in the posterior right upper lobe is mildly increased, potentially mild metastatic progression. Additional scattered small bilateral pulmonary nodules are stable to minimally increased. 4. Sclerotic T8 and L2 vertebral osseous lesions are stable. No new osseous lesions. Aortic Atherosclerosis (ICD10-I70.0). Electronically Signed   By: JIlona SorrelM.D.   On: 03/17/2018 15:28   Nm Bone Scan Whole Body  Result Date: 03/17/2018 CLINICAL DATA:  Breast cancer EXAM: NUCLEAR MEDICINE WHOLE BODY BONE SCAN TECHNIQUE: Whole body anterior and posterior images were obtained approximately 3 hours after intravenous injection of radiopharmaceutical. RADIOPHARMACEUTICALS:  22 mCi Technetium-938mDP IV COMPARISON:  12/02/2017 Correlation: CT chest 03/17/2018 FINDINGS: Multiple foci of abnormal increased osseous tracer accumulation are identified consistent with osseous metastatic disease. These include sternum, posterior LEFT fourth and questionably ninth ribs, midthoracic spine at approximately T7, lumbar spine at L1, L3 and L5, RIGHT parietal calvarium, and proximal  RIGHT femoral diaphysis. All of these lesions except for the questionable posterior LEFT ninth rib uptake were present on previous exam. No other new sites of abnormal osseous tracer accumulation. Uptake at the shoulders, knees, and RIGHT elbow, typically degenerative. Expected urinary tract and soft tissue distribution of tracer. IMPRESSION: Multiple sites of abnormal osseous tracer accumulation identified consistent with osseous metastatic disease as above. Questionable new subtle focus of increased uptake at the posterior LEFT ninth rib versus artifact. Electronically Signed   By: Lavonia Dana M.D.   On: 03/17/2018 16:36     ELIGIBLE FOR AVAILABLE RESEARCH PROTOCOL: no  ASSESSMENT: 77 y.o. [place] woman  (1) status post right breast upper outer quadrant lumpectomy and axillary lymph node dissection in 2007 for what appears to have been a T1 N0, stage IA invasive ductal carcinoma, treated adjuvantly with radiation (33 sessions)  METASTATIC DISEASE definitively documented Sept 2015 (2) bronchoscopic biopsy 01/07/2014 showed a low-grade mucinous breast cancer, strongly estrogen receptor positive, progesterone receptor positive, HER-2 negative; staging studies confirmed extensive hypermetabolic adenopathy, multiple bone lesions, likely early lung involvement, but no liver lesions.  (a) bone scan and chest CT 07/09/2016 shows stable disease.  (b) CA-27-29 is moderately informative  (c) bone scan and CT scan of the chest 04/24/2017 shows essentially stable disease  (3) on Arimidex between September 2015 and June 2017, with evidence of response; discontinued secondary to side effects  (a) bone density 04/10/2016 shows osteopenia with a T score of -2.1  (4) on monthly denosumab/Xgeva between 07/30/2014 and 10/04/2015, discontinued due to patient's concerns regarding osteonecrosis of the jaw  (a) discussed again October 2017, the patient adamantly refusing denosumab  (5) started fulvestrant  01/26/2016    (a) Palbociclib added at 75 mg M/W/F, beginning mid February 2018  (b) chest CT scan obtained 12/02/2017 shows small but measurable growth of lung lesions; bones are stable  (c) palbociclib dose increased to 75 mg daily 21/7 beginning 12/23/2017  PLAN Sukhu is now 4 years out from definitive diagnosis of metastatic breast cancer.  Her cancer is well controlled on her current treatment and she is tolerating the palbociclib and fulvestrant remarkably well.  The plan is to continue indefinitely or until there is clear evidence of progression  In light of that we did discuss the scans in detail today.  There are some questionable findings on the bone scan and also on the CT of the chest, but after review I really do think that they are not unequivocal evidence of progression and so we are continuing with the same treatment as before.  She will have her next treatment 4 weeks from now and then I will see her again in 8 weeks from now.  She knows to call for any other issues that may develop before the next visit here.    , Virgie Dad, MD  03/26/18 12:20 PM Medical Oncology and Hematology [facility] [address] [place], [place] [address] Tel. [phone]    Fax. [phone]   I, Jacqualyn Posey am acting as a Education administrator for Chauncey Cruel, MD.   I, Lurline Del MD, have reviewed the above documentation for accuracy and completeness, and I agree with the above.

## 2018-03-26 ENCOUNTER — Inpatient Hospital Stay (HOSPITAL_BASED_OUTPATIENT_CLINIC_OR_DEPARTMENT_OTHER): Payer: Medicare Other | Admitting: Oncology

## 2018-03-26 ENCOUNTER — Ambulatory Visit: Payer: Medicare Other | Admitting: Internal Medicine

## 2018-03-26 ENCOUNTER — Inpatient Hospital Stay: Payer: Medicare Other | Attending: Oncology

## 2018-03-26 ENCOUNTER — Inpatient Hospital Stay: Payer: Medicare Other

## 2018-03-26 VITALS — BP 132/66 | HR 72 | Temp 98.0°F | Resp 18 | Ht 62.0 in | Wt 126.1 lb

## 2018-03-26 DIAGNOSIS — C7800 Secondary malignant neoplasm of unspecified lung: Secondary | ICD-10-CM | POA: Diagnosis not present

## 2018-03-26 DIAGNOSIS — C50411 Malignant neoplasm of upper-outer quadrant of right female breast: Secondary | ICD-10-CM | POA: Insufficient documentation

## 2018-03-26 DIAGNOSIS — C771 Secondary and unspecified malignant neoplasm of intrathoracic lymph nodes: Secondary | ICD-10-CM | POA: Insufficient documentation

## 2018-03-26 DIAGNOSIS — Z853 Personal history of malignant neoplasm of breast: Secondary | ICD-10-CM

## 2018-03-26 DIAGNOSIS — C7971 Secondary malignant neoplasm of right adrenal gland: Secondary | ICD-10-CM

## 2018-03-26 DIAGNOSIS — Z170 Estrogen receptor positive status [ER+]: Secondary | ICD-10-CM

## 2018-03-26 DIAGNOSIS — Z5111 Encounter for antineoplastic chemotherapy: Secondary | ICD-10-CM | POA: Insufficient documentation

## 2018-03-26 DIAGNOSIS — Z87891 Personal history of nicotine dependence: Secondary | ICD-10-CM

## 2018-03-26 DIAGNOSIS — C50911 Malignant neoplasm of unspecified site of right female breast: Secondary | ICD-10-CM

## 2018-03-26 DIAGNOSIS — C7951 Secondary malignant neoplasm of bone: Secondary | ICD-10-CM | POA: Diagnosis not present

## 2018-03-26 LAB — CBC WITH DIFFERENTIAL/PLATELET
ABS IMMATURE GRANULOCYTES: 0.01 10*3/uL (ref 0.00–0.07)
Basophils Absolute: 0.1 10*3/uL (ref 0.0–0.1)
Basophils Relative: 2 %
Eosinophils Absolute: 0.1 10*3/uL (ref 0.0–0.5)
Eosinophils Relative: 4 %
HEMATOCRIT: 37.3 % (ref 36.0–46.0)
Hemoglobin: 12.9 g/dL (ref 12.0–15.0)
IMMATURE GRANULOCYTES: 0 %
LYMPHS ABS: 1.2 10*3/uL (ref 0.7–4.0)
Lymphocytes Relative: 39 %
MCH: 37 pg — ABNORMAL HIGH (ref 26.0–34.0)
MCHC: 34.6 g/dL (ref 30.0–36.0)
MCV: 106.9 fL — AB (ref 80.0–100.0)
MONOS PCT: 5 %
Monocytes Absolute: 0.2 10*3/uL (ref 0.1–1.0)
NEUTROS ABS: 1.6 10*3/uL — AB (ref 1.7–7.7)
NEUTROS PCT: 50 %
Platelets: 241 10*3/uL (ref 150–400)
RBC: 3.49 MIL/uL — ABNORMAL LOW (ref 3.87–5.11)
RDW: 13.6 % (ref 11.5–15.5)
WBC: 3.2 10*3/uL — ABNORMAL LOW (ref 4.0–10.5)
nRBC: 0 % (ref 0.0–0.2)

## 2018-03-26 LAB — COMPREHENSIVE METABOLIC PANEL
ALT: 15 U/L (ref 0–44)
AST: 18 U/L (ref 15–41)
Albumin: 3.8 g/dL (ref 3.5–5.0)
Alkaline Phosphatase: 75 U/L (ref 38–126)
Anion gap: 11 (ref 5–15)
BILIRUBIN TOTAL: 0.4 mg/dL (ref 0.3–1.2)
BUN: 19 mg/dL (ref 8–23)
CHLORIDE: 105 mmol/L (ref 98–111)
CO2: 23 mmol/L (ref 22–32)
CREATININE: 0.79 mg/dL (ref 0.44–1.00)
Calcium: 9 mg/dL (ref 8.9–10.3)
GFR calc Af Amer: >60 mL/min (ref >60)
Glucose, Bld: 91 mg/dL (ref 70–99)
Potassium: 4.4 mmol/L (ref 3.5–5.1)
Sodium: 139 mmol/L (ref 135–145)
TOTAL PROTEIN: 7.1 g/dL (ref 6.5–8.1)

## 2018-03-26 MED ORDER — FULVESTRANT 250 MG/5ML IM SOLN
500.0000 mg | Freq: Once | INTRAMUSCULAR | Status: AC
  Administered 2018-03-26: 500 mg via INTRAMUSCULAR

## 2018-03-26 MED ORDER — FULVESTRANT 250 MG/5ML IM SOLN
INTRAMUSCULAR | Status: AC
  Filled 2018-03-26: qty 5

## 2018-03-26 NOTE — Patient Instructions (Signed)

## 2018-03-27 LAB — CANCER ANTIGEN 27.29: CA 27.29: 73.9 U/mL — ABNORMAL HIGH (ref 0.0–38.6)

## 2018-04-05 ENCOUNTER — Other Ambulatory Visit: Payer: Self-pay | Admitting: Oncology

## 2018-04-08 ENCOUNTER — Ambulatory Visit: Payer: Medicare Other | Admitting: Internal Medicine

## 2018-04-10 ENCOUNTER — Other Ambulatory Visit: Payer: Self-pay | Admitting: *Deleted

## 2018-04-10 ENCOUNTER — Telehealth: Payer: Self-pay | Admitting: Internal Medicine

## 2018-04-10 MED FILL — IBRANCE 75 MG CAPSULE: 75 | 28 days supply | Qty: 21 | Fill #4

## 2018-04-10 NOTE — Telephone Encounter (Signed)
Copied from [place] [id number]. Topic: Quick Communication - See Telephone Encounter >> Apr 10, 2018  9:55 AM Ahmed Prima L wrote: CRM for notification. See Telephone encounter for: 04/10/18.  Patient called in to see if Dr Jerilee Hoh would take over her CARTIA XT 120 MG 24 hr capsule. Patient states that Dr Raliegh Ip was writing this script for her and after he retired her oncologist did her a one time favor of sending that into the pharmacy for her until she established with Dr Jerilee Hoh. Patient called in this morning and stated that she needed it refilled, patient was advised that the medication was sent to the pharmacy on 04/07/18 by her oncologist. Patient said that the pharmacy did not receive that. I asked the patient to call her oncologist to have them re-send the medication, as we can not send in medications for other offices. She stated that she wanted to speak with Apolonio Schneiders about this, so I contacted the office and spoke with Miquel Dunn that advised the same information. I let the patient know of this information and patient insisted that Dr Jerilee Hoh do this for her. I spoke with our Team Lead, Edwena Blow, who then let the patient know the same information and Edwena Blow contacted the Oncologist for her. Patient verbalized understanding and will await a phone call from [place]. Thanks.

## 2018-04-10 NOTE — Telephone Encounter (Signed)
Spoke with patient and the refill has already been sent.  I advised patient to call before she runs out next refill and we will send it in.

## 2018-04-10 NOTE — Telephone Encounter (Signed)
Absolutely.

## 2018-04-10 NOTE — Telephone Encounter (Signed)
Okay to refill? 

## 2018-04-14 ENCOUNTER — Other Ambulatory Visit: Payer: Self-pay | Admitting: Internal Medicine

## 2018-04-14 DIAGNOSIS — S0502XA Injury of conjunctiva and corneal abrasion without foreign body, left eye, initial encounter: Secondary | ICD-10-CM | POA: Diagnosis not present

## 2018-04-14 DIAGNOSIS — Z170 Estrogen receptor positive status [ER+]: Principal | ICD-10-CM

## 2018-04-14 DIAGNOSIS — Z961 Presence of intraocular lens: Secondary | ICD-10-CM | POA: Diagnosis not present

## 2018-04-14 DIAGNOSIS — H02831 Dermatochalasis of right upper eyelid: Secondary | ICD-10-CM | POA: Diagnosis not present

## 2018-04-14 DIAGNOSIS — H2640 Unspecified secondary cataract: Secondary | ICD-10-CM | POA: Diagnosis not present

## 2018-04-14 DIAGNOSIS — C50411 Malignant neoplasm of upper-outer quadrant of right female breast: Secondary | ICD-10-CM

## 2018-04-24 ENCOUNTER — Inpatient Hospital Stay: Payer: Medicare Other

## 2018-04-24 ENCOUNTER — Inpatient Hospital Stay: Payer: Medicare Other | Attending: Oncology

## 2018-04-24 DIAGNOSIS — C50411 Malignant neoplasm of upper-outer quadrant of right female breast: Secondary | ICD-10-CM | POA: Diagnosis not present

## 2018-04-24 DIAGNOSIS — C7951 Secondary malignant neoplasm of bone: Secondary | ICD-10-CM

## 2018-04-24 DIAGNOSIS — Z853 Personal history of malignant neoplasm of breast: Secondary | ICD-10-CM

## 2018-04-24 DIAGNOSIS — C7971 Secondary malignant neoplasm of right adrenal gland: Secondary | ICD-10-CM | POA: Diagnosis not present

## 2018-04-24 DIAGNOSIS — C50911 Malignant neoplasm of unspecified site of right female breast: Secondary | ICD-10-CM

## 2018-04-24 DIAGNOSIS — Z5111 Encounter for antineoplastic chemotherapy: Secondary | ICD-10-CM | POA: Diagnosis not present

## 2018-04-24 DIAGNOSIS — C7800 Secondary malignant neoplasm of unspecified lung: Secondary | ICD-10-CM | POA: Diagnosis not present

## 2018-04-24 DIAGNOSIS — C771 Secondary and unspecified malignant neoplasm of intrathoracic lymph nodes: Secondary | ICD-10-CM | POA: Insufficient documentation

## 2018-04-24 LAB — CBC WITH DIFFERENTIAL/PLATELET
Abs Immature Granulocytes: 0.01 10*3/uL (ref 0.00–0.07)
Basophils Absolute: 0.1 10*3/uL (ref 0.0–0.1)
Basophils Relative: 2 %
Eosinophils Absolute: 0.1 10*3/uL (ref 0.0–0.5)
Eosinophils Relative: 5 %
HCT: 34.4 % — ABNORMAL LOW (ref 36.0–46.0)
Hemoglobin: 11.9 g/dL — ABNORMAL LOW (ref 12.0–15.0)
IMMATURE GRANULOCYTES: 0 %
Lymphocytes Relative: 34 %
Lymphs Abs: 1 10*3/uL (ref 0.7–4.0)
MCH: 37.3 pg — ABNORMAL HIGH (ref 26.0–34.0)
MCHC: 34.6 g/dL (ref 30.0–36.0)
MCV: 107.8 fL — ABNORMAL HIGH (ref 80.0–100.0)
Monocytes Absolute: 0.2 10*3/uL (ref 0.1–1.0)
Monocytes Relative: 6 %
NEUTROS PCT: 53 %
Neutro Abs: 1.5 10*3/uL — ABNORMAL LOW (ref 1.7–7.7)
Platelets: 262 10*3/uL (ref 150–400)
RBC: 3.19 MIL/uL — AB (ref 3.87–5.11)
RDW: 12.1 % (ref 11.5–15.5)
WBC: 2.9 10*3/uL — AB (ref 4.0–10.5)
nRBC: 0 % (ref 0.0–0.2)

## 2018-04-24 LAB — COMPREHENSIVE METABOLIC PANEL
ALT: 15 U/L (ref 0–44)
AST: 17 U/L (ref 15–41)
Albumin: 3.5 g/dL (ref 3.5–5.0)
Alkaline Phosphatase: 80 U/L (ref 38–126)
Anion gap: 6 (ref 5–15)
BUN: 16 mg/dL (ref 8–23)
CO2: 26 mmol/L (ref 22–32)
Calcium: 8.6 mg/dL — ABNORMAL LOW (ref 8.9–10.3)
Chloride: 104 mmol/L (ref 98–111)
Creatinine, Ser: 0.8 mg/dL (ref 0.44–1.00)
GFR calc Af Amer: >60 mL/min (ref >60)
GFR calc non Af Amer: >60 mL/min (ref >60)
Glucose, Bld: 106 mg/dL — ABNORMAL HIGH (ref 70–99)
POTASSIUM: 3.9 mmol/L (ref 3.5–5.1)
Sodium: 136 mmol/L (ref 135–145)
Total Bilirubin: 0.6 mg/dL (ref 0.3–1.2)
Total Protein: 6.3 g/dL — ABNORMAL LOW (ref 6.5–8.1)

## 2018-04-24 MED ORDER — FULVESTRANT 250 MG/5ML IM SOLN
INTRAMUSCULAR | Status: AC
  Filled 2018-04-24: qty 5

## 2018-04-24 MED ORDER — FULVESTRANT 250 MG/5ML IM SOLN
500.0000 mg | Freq: Once | INTRAMUSCULAR | Status: AC
  Administered 2018-04-24: 500 mg via INTRAMUSCULAR

## 2018-04-25 LAB — CANCER ANTIGEN 27.29: CA 27.29: 68.2 U/mL — ABNORMAL HIGH (ref 0.0–38.6)

## 2018-05-07 MED FILL — IBRANCE 75 MG CAPSULE: 75 | 28 days supply | Qty: 21 | Fill #5

## 2018-05-13 ENCOUNTER — Other Ambulatory Visit: Payer: Self-pay | Admitting: Internal Medicine

## 2018-05-13 DIAGNOSIS — C50411 Malignant neoplasm of upper-outer quadrant of right female breast: Secondary | ICD-10-CM

## 2018-05-13 DIAGNOSIS — Z170 Estrogen receptor positive status [ER+]: Principal | ICD-10-CM

## 2018-05-21 NOTE — Progress Notes (Signed)
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   She continues on fulvestrant. She tolerates this well and without any noticeable side effects.    The patient also continues on palbociclib. She tolerates this well and without any noticeable side effects.     Her last restaging studies, consisting of a chest CT and a whole body bone scan, were on 03/17/2018 and were essentially stable.  Results for REVIA, NGHIEM (MRN [id number]) as of 05/22/2018 08:16  Ref. Range 01/03/2018 08:35 01/30/2018 08:30 02/27/2018 08:04 03/26/2018 11:33 04/24/2018 08:14  CA 27.29 Latest Ref Range: 0.0 - 38.6 U/mL 70.3 (H) 72.7 (H) 72.2 (H) 73.9 (H) 68.2 (H)   Since her last visit here, she has not undergone any additional studies.     REVIEW OF SYSTEMS: Elany was recently was on antibiotics to fix a sinus issue that she has had for the last year; this has rsolved. She has nausea, which she has been eating and drinking ginger based products to treat. She has had a headache that comes and goes.  She does not take any medication to treat this.  She has a little bit of dry eye but no other visual changes.  There have been no problems with balance or falls.  She states that  sometimes she feels like she couldn't get a deep breath. She has some constipation. For the holidays, her grandchildren came over. She has no areas of concern with pain. The patient denies unusual visual changes, vomiting, or dizziness. There has been no unusual cough, phlegm production, or pleurisy. This been no change in bladder habits. The patient denies unexplained fatigue or unexplained weight loss, bleeding, rash, or fever. A detailed review of systems was otherwise noncontributory.     BREAST CANCER HISTORY: From the earlier summary note  Havyn tells me in 2007 while living in [place] she was found to have a very small cancer in the upper-outer quadrant of the right breast, about the size of the P, noted on mammography. It was not palpable. She had a right lumpectomy and full sentinel lymph node sampling. She then received adjuvant radiation, to a total of 33 treatments. She received no systemic therapy.  She then did well until December 2014, when she had a fall and complain of pain in her left ribs. Rib films and chest x-ray on 04/13/2013 showed no fractures, but CT of the abdomen and pelvis on the same day found subcarinal and right hilar adenopathy. This was followed up with a chest CT scan 05/05/2013 confirming extensive mediastinal and right hilar lymphadenopathy, with multiple pulmonary nodules, the largest measuring 0.9 cm. PET scan 05/21/2013 showed hypermetabolic adenopathy in the right and left paratracheal areas, the precarinal, subcarinal and right hilar areas, but no involvement of the liver, and the lung nodules were not hypermetabolic (although they were below the level  of reliable detection). In addition, at L5 there was a lucency measuring 1.2 cm.  The PET scan also showed a hypermetabolic focus on the thyroid gland, which was evaluated with neck ultrasound and biopsy 19/62/2297, showing a follicular lesion of undetermined significance (([id number]).  The patient was referred  to pulmonary [Dr Melvyn Novas and Dr Julien Nordmann for further evaluation. Repeat PET scan 12/28/2013 showed, in addition to the adenopathy previously noted, now multiple bony metastases. On 01/07/2014 the patient underwent bronchoscopy and this showed ([id number], together with separate cytology] a low-grade mucinous invasive breast cancer, estrogen receptor 100% positive, progesterone receptor 14% positive, with no HER-2 amplification, the signals ratio being 1.30 and the number per cell 1.95.  The patient was started on anastrozole 01/22/2014; monthly denosumab/Xgeva was added 07/30/2014. She appeared to tolerate this well, and her CA-27-29 (127 at baseline) normalized. Most recent CT scans of chest abdomen and pelvis 10/03/2015 showed continuing response. Despite this good news however, the patient decided to go off anastrozole and denosumab/Xgeva as of June 2017. By the time she saw Dr. Earlie Server again in September 2017 her tumor marker had doubled. At that time the patient was referred to the breast clinic for a second opinion regarding further evaluation and treatment.   PAST MEDICAL HISTORY: Past Medical History:  Diagnosis Date  . Allergy   . Anxiety   . Bone cancer ([facility]) mri 11/11/14   right parietal bone,left cribriform plate metastases  . Cancer [facility]) 2007   right breat ca  , lumpectomy and radiation tx (declined chemo and additional prophylactic meds)  . Cataract    both eyes  10/2016 Lt.     04/2017 Rt.   . CEREBROVASCULAR DISEASE 03/03/2010  . Diverticulitis   . DIVERTICULOSIS, COLON 03/03/2010  . Fatty liver 08/02/09   as per U/S done by [facility]  . Foramen ovale    positive bubble study  . GERD (gastroesophageal reflux disease)   . Headache(784.0)    she thinks sinus headaches  . History of cerebral artery stenosis    right middle  . HYPERLIPIDEMIA 03/03/2010  . Hypertension   . HYPOTHYROIDISM 03/03/2010   no longer on meds  . Internal hemorrhoid   . Low back pain   .  Mastoiditis    noted on brain MRI  . Rib fracture   . Right leg pain   . Shortness of breath   . Stroke [facility]) Oct. 2011   TIA  . Ulcer     PAST SURGICAL HISTORY: Past Surgical History:  Procedure Laterality Date  . BREAST LUMPECTOMY     right  . CATARACT EXTRACTION Left    10/2016   Rt 04/2017  . CHOLECYSTECTOMY    . COLONOSCOPY    . POLYPECTOMY     benign cecum  . SHOULDER SURGERY     right shoulder (dislocation)  . TONSILLECTOMY    . TUBAL LIGATION    . UPPER GASTROINTESTINAL ENDOSCOPY    . VIDEO BRONCHOSCOPY WITH ENDOBRONCHIAL ULTRASOUND N/A 01/07/2014   Procedure: VIDEO BRONCHOSCOPY WITH ENDOBRONCHIAL ULTRASOUND;  Surgeon: Ivin Poot, MD;  Location: [facility] OR;  Service: Thoracic;  Laterality: N/A;    FAMILY HISTORY Family History  Problem Relation Age of Onset  . Heart disease Sister        cerebral vascular disease also  . Heart disease Brother        cerebral vascular disease also  . Heart disease Brother        cerebral  vascular disease  . Heart disease Sister        cebreal vascular disease also  . Heart disease Sister        cebreal vascular diease also  . Heart disease Sister        cerebral vascular diease also  . Stomach cancer Father   . Colon cancer Neg Hx   . Esophageal cancer Neg Hx   . Rectal cancer Neg Hx   . Colon polyps Neg Hx   . Asthma Neg Hx   The patient's father died from stomach cancer in his late 67s. The patient's mother died in her 35s from a stroke. The patient has 2 brothers, 4 sisters. One sister had leukemia. There is no history of breast, colon, or ovarian cancer in the family to her knowledge   GYNECOLOGIC HISTORY:  No LMP recorded. Patient is postmenopausal. Menarche age 89, first live birth age 3, the patient is Vaughn P2. She went through menopause in her late 19s. She took no hormone replacement. She took oral contraceptives for approximately 2 years remotely without complications.   SOCIAL HISTORY: (updated  05/22/2018) Eleanor is originally from [place], [place]. She worked as a Education officer, museum, particularly, in the field of substance abuse. Her husband, Mortimer Fries, is a retired substance abuse Social worker. This is a second marriage for both of them. Aaliyah has 2 children from her first marriage, [name], lives in [place], and worked as a Audiological scientist but is now disabled, and Lawrence Santiago, who lives in [place] and works in Engineer, mining. Mikki Santee has 3 children from his first marriage, Tanna Furry, [place], and [place]. [name] Junior lives in [place] and has his own trucking business. Mikki Santee tells me he is estranged from his 2 daughters Amedeo Gory (a retired Pharmacist, hospital) and Salena Saner (who works in a bank). They are living on [place].The patient has 8 grandchildren. Tyasia attends [employer].   ADVANCED DIRECTIVES: in place   HEALTH MAINTENANCE: Social History   Tobacco Use  . Smoking status: Former Smoker    Packs/day: 0.50    Years: 20.00    Pack years: 10.00    Types: Cigarettes    Last attempt to quit: 05/01/1991    Years since quitting: 27.0  . Smokeless tobacco: Never Used  Substance Use Topics  . Alcohol use: No    Alcohol/week: 0.0 standard drinks  . Drug use: No     Colonoscopy:  PAP:  Bone density:   Allergies  Allergen Reactions  . Ciprofloxacin Anaphylaxis    Current Outpatient Medications  Medication Sig Dispense Refill  . ALPRAZolam (XANAX) 0.25 MG tablet TAKE 1 TABLET BY MOUTH THREE TIMES DAILY AS NEEDED 90 tablet 2  . aspirin EC 81 MG tablet Take 81 mg by mouth daily after breakfast.     . CARTIA XT 120 MG 24 hr capsule TAKE 1 CAPSULE(120 MG) BY MOUTH DAILY 90 capsule 0  . cetirizine (ZYRTEC) 10 MG tablet Take 10 mg by mouth daily.    . Cholecalciferol (VITAMIN D PO) Take 1,000 Units by mouth 2 (two) times daily.    Marland Kitchen docusate sodium (COLACE) 100 MG capsule Take 100 mg by mouth daily as needed for mild constipation.    . fluticasone (FLONASE) 50 MCG/ACT  nasal spray Place 2 sprays into both nostrils daily. 16 g 1  . FULVESTRANT IM Inject into the muscle. Takes every 28 days    . Glucosamine 500 MG CAPS Take 500 mg by mouth daily.     Marland Kitchen  loratadine (CLARITIN) 10 MG tablet Take 1 tablet (10 mg total) by mouth daily. 30 tablet 11  . OVER THE COUNTER MEDICATION Beno for gas, takes twice daily    . palbociclib (IBRANCE) 75 MG capsule Take 1 capsule (75 mg total) by mouth daily with breakfast. Take whole with food. Take for 21 days on, 7 days off, repeat every 28 days. 21 capsule 6  . Probiotic Product (PROBIOTIC DAILY PO) Take 1 tablet by mouth daily.      Current Facility-Administered Medications  Medication Dose Route Frequency Provider Last Rate Last Dose  . 0.9 %  sodium chloride infusion  500 mL Intravenous Once Armbruster, Carlota Raspberry, MD        OBJECTIVE: Middle-aged Latin American woman in no acute distress  Vitals:   05/22/18 0823  BP: 135/68  Pulse: 74  Resp: 17  Temp: (!) 97.5 F (36.4 C)  SpO2: 99%     Body mass index is 23.16 kg/m.    ECOG FS:1 - Symptomatic but completely ambulatory  Sclerae unicteric, pupils round and equal Oropharynx clear and moist No cervical or supraclavicular adenopathy Lungs no rales or rhonchi Heart regular rate and rhythm Abd soft, nontender, positive bowel sounds MSK no focal spinal tenderness, no upper extremity lymphedema Neuro: nonfocal, well oriented, appropriate affect Breasts: The right breast is status post lumpectomy.  There is no evidence of local recurrence.  The left breast is benign.  Both axillae are benign.    LAB RESULTS:  CMP     Component Value Date/Time   NA 136 04/24/2018 0814   NA 138 04/18/2017 0853   K 3.9 04/24/2018 0814   K 4.4 04/18/2017 0853   CL 104 04/24/2018 0814   CO2 26 04/24/2018 0814   CO2 29 04/18/2017 0853   GLUCOSE 106 (H) 04/24/2018 0814   GLUCOSE 95 04/18/2017 0853   BUN 16 04/24/2018 0814   BUN 16.6 04/18/2017 0853   CREATININE 0.80 04/24/2018  0814   CREATININE 0.8 04/18/2017 0853   CALCIUM 8.6 (L) 04/24/2018 0814   CALCIUM 9.2 04/18/2017 0853   PROT 6.3 (L) 04/24/2018 0814   PROT 7.0 04/18/2017 0853   ALBUMIN 3.5 04/24/2018 0814   ALBUMIN 3.8 04/18/2017 0853   AST 17 04/24/2018 0814   AST 17 04/18/2017 0853   ALT 15 04/24/2018 0814   ALT 16 04/18/2017 0853   ALKPHOS 80 04/24/2018 0814   ALKPHOS 90 04/18/2017 0853   BILITOT 0.6 04/24/2018 0814   BILITOT 0.63 04/18/2017 0853   GFRNONAA >60 04/24/2018 0814   GFRAA >60 04/24/2018 0814    INo results found for: SPEP, UPEP  Lab Results  Component Value Date   WBC 2.9 (L) 05/22/2018   NEUTROABS PENDING 05/22/2018   HGB 12.5 05/22/2018   HCT 35.8 (L) 05/22/2018   MCV 109.1 (H) 05/22/2018   PLT 243 05/22/2018      Chemistry      Component Value Date/Time   NA 136 04/24/2018 0814   NA 138 04/18/2017 0853   K 3.9 04/24/2018 0814   K 4.4 04/18/2017 0853   CL 104 04/24/2018 0814   CO2 26 04/24/2018 0814   CO2 29 04/18/2017 0853   BUN 16 04/24/2018 0814   BUN 16.6 04/18/2017 0853   CREATININE 0.80 04/24/2018 0814   CREATININE 0.8 04/18/2017 0853      Component Value Date/Time   CALCIUM 8.6 (L) 04/24/2018 0814   CALCIUM 9.2 04/18/2017 0853   ALKPHOS 80 04/24/2018 7412  ALKPHOS 90 04/18/2017 0853   AST 17 04/24/2018 0814   AST 17 04/18/2017 0853   ALT 15 04/24/2018 0814   ALT 16 04/18/2017 0853   BILITOT 0.6 04/24/2018 0814   BILITOT 0.63 04/18/2017 0853      Most recent CA-27-29 had risen to 57.6.  This is being repeated today  No components found for: LABCA125  No results for input(s): INR in the last 168 hours.  Urinalysis    Component Value Date/Time   COLORURINE STRAW (A) 11/19/2016 1750   APPEARANCEUR CLEAR 11/19/2016 1750   LABSPEC 1.003 (L) 11/19/2016 1750   PHURINE 7.0 11/19/2016 1750   GLUCOSEU NEGATIVE 11/19/2016 1750   HGBUR NEGATIVE 11/19/2016 1750   BILIRUBINUR NEGATIVE 11/19/2016 1750   BILIRUBINUR neg 02/16/2015 1318    KETONESUR NEGATIVE 11/19/2016 1750   PROTEINUR NEGATIVE 11/19/2016 1750   UROBILINOGEN 0.2 02/16/2015 1318   UROBILINOGEN 0.2 11/18/2013 1321   NITRITE NEGATIVE 11/19/2016 1750   LEUKOCYTESUR TRACE (A) 11/19/2016 1750     STUDIES: No results found.   ELIGIBLE FOR AVAILABLE RESEARCH PROTOCOL: no  ASSESSMENT: 78 y.o. [place] woman  (1) status post right breast upper outer quadrant lumpectomy and axillary lymph node dissection in 2007 for what appears to have been a T1 N0, stage IA invasive ductal carcinoma, treated adjuvantly with radiation (33 sessions)  METASTATIC DISEASE definitively documented Sept 2015 (2) bronchoscopic biopsy 01/07/2014 showed a low-grade mucinous breast cancer, strongly estrogen receptor positive, progesterone receptor positive, HER-2 negative; staging studies confirmed extensive hypermetabolic adenopathy, multiple bone lesions, likely early lung involvement, but no liver lesions.  (a) bone scan and chest CT 07/09/2016 shows stable disease.  (b) CA-27-29 is moderately informative  (c) bone scan and CT scan of the chest 04/24/2017 shows essentially stable disease  (3) on Arimidex between September 2015 and June 2017, with evidence of response; discontinued secondary to side effects  (a) bone density 04/10/2016 shows osteopenia with a T score of -2.1  (4) on monthly denosumab/Xgeva between 07/30/2014 and 10/04/2015, discontinued due to patient's concerns regarding osteonecrosis of the jaw  (a) discussed again October 2017, the patient adamantly refusing denosumab  (5) started fulvestrant 01/26/2016    (a) Palbociclib added at 75 mg M/W/F, beginning mid February 2018  (b) chest CT scan obtained 12/02/2017 shows small but measurable growth of lung lesions; bones are stable  (c) palbociclib dose increased to 75 mg daily 21/7 beginning 12/23/2017   PLAN Langenderfer is now over 4 years out from definitive diagnosis of metastatic breast cancer, with no clinically  active disease.  This is very favorable.  She is tolerating the fulvestrant and palbociclib well at the current doses and no changes are being made in those.  She has some chronic sinus issues which improved temporarily with doxycycline.  I recommended she continue on Claritin indefinitely or at least through the winter.  She will return in 4 weeks for another dose of Faslodex and then in 8 weeks and I will see her with the 8-week dose.  I suggested we might want to restage her in May, which would be 6 months, but she is hoping we will not have to repeat any studies until November and we will discuss that with her at the March visit.    Viviano Bir, Virgie Dad, MD  05/22/18 8:39 AM Medical Oncology and Hematology [facility] [address] [place], [place] [address] Tel. [phone]    Fax. [phone]  I, [employer] am acting as a Education administrator  for Chauncey Cruel, MD.   I, Lurline Del MD, have reviewed the above documentation for accuracy and completeness, and I agree with the above.

## 2018-05-22 ENCOUNTER — Inpatient Hospital Stay: Payer: Medicare Other | Attending: Oncology

## 2018-05-22 ENCOUNTER — Inpatient Hospital Stay (HOSPITAL_BASED_OUTPATIENT_CLINIC_OR_DEPARTMENT_OTHER): Payer: Medicare Other | Admitting: Oncology

## 2018-05-22 ENCOUNTER — Telehealth: Payer: Self-pay | Admitting: Oncology

## 2018-05-22 ENCOUNTER — Inpatient Hospital Stay: Payer: Medicare Other

## 2018-05-22 VITALS — BP 135/68 | HR 74 | Temp 97.5°F | Resp 17 | Ht 62.0 in | Wt 126.6 lb

## 2018-05-22 DIAGNOSIS — Z5111 Encounter for antineoplastic chemotherapy: Secondary | ICD-10-CM | POA: Diagnosis not present

## 2018-05-22 DIAGNOSIS — C771 Secondary and unspecified malignant neoplasm of intrathoracic lymph nodes: Secondary | ICD-10-CM

## 2018-05-22 DIAGNOSIS — Z87891 Personal history of nicotine dependence: Secondary | ICD-10-CM

## 2018-05-22 DIAGNOSIS — Z170 Estrogen receptor positive status [ER+]: Secondary | ICD-10-CM | POA: Diagnosis not present

## 2018-05-22 DIAGNOSIS — C7800 Secondary malignant neoplasm of unspecified lung: Secondary | ICD-10-CM | POA: Insufficient documentation

## 2018-05-22 DIAGNOSIS — C50411 Malignant neoplasm of upper-outer quadrant of right female breast: Secondary | ICD-10-CM

## 2018-05-22 DIAGNOSIS — C7971 Secondary malignant neoplasm of right adrenal gland: Secondary | ICD-10-CM

## 2018-05-22 DIAGNOSIS — K5900 Constipation, unspecified: Secondary | ICD-10-CM | POA: Diagnosis not present

## 2018-05-22 DIAGNOSIS — Z853 Personal history of malignant neoplasm of breast: Secondary | ICD-10-CM

## 2018-05-22 DIAGNOSIS — C50911 Malignant neoplasm of unspecified site of right female breast: Secondary | ICD-10-CM

## 2018-05-22 DIAGNOSIS — C7951 Secondary malignant neoplasm of bone: Secondary | ICD-10-CM | POA: Insufficient documentation

## 2018-05-22 LAB — CBC WITH DIFFERENTIAL/PLATELET
Abs Immature Granulocytes: 0.01 10*3/uL (ref 0.00–0.07)
BASOS ABS: 0.1 10*3/uL (ref 0.0–0.1)
Basophils Relative: 2 %
EOS ABS: 0.1 10*3/uL (ref 0.0–0.5)
Eosinophils Relative: 5 %
HCT: 35.8 % — ABNORMAL LOW (ref 36.0–46.0)
Hemoglobin: 12.5 g/dL (ref 12.0–15.0)
Immature Granulocytes: 0 %
Lymphocytes Relative: 33 %
Lymphs Abs: 1 10*3/uL (ref 0.7–4.0)
MCH: 38.1 pg — ABNORMAL HIGH (ref 26.0–34.0)
MCHC: 34.9 g/dL (ref 30.0–36.0)
MCV: 109.1 fL — ABNORMAL HIGH (ref 80.0–100.0)
Monocytes Absolute: 0.2 10*3/uL (ref 0.1–1.0)
Monocytes Relative: 6 %
Neutro Abs: 1.6 10*3/uL — ABNORMAL LOW (ref 1.7–7.7)
Neutrophils Relative %: 54 %
PLATELETS: 243 10*3/uL (ref 150–400)
RBC: 3.28 MIL/uL — ABNORMAL LOW (ref 3.87–5.11)
RDW: 12.3 % (ref 11.5–15.5)
WBC: 2.9 10*3/uL — AB (ref 4.0–10.5)
nRBC: 0 % (ref 0.0–0.2)

## 2018-05-22 LAB — COMPREHENSIVE METABOLIC PANEL
ALT: 15 U/L (ref 0–44)
AST: 18 U/L (ref 15–41)
Albumin: 3.6 g/dL (ref 3.5–5.0)
Alkaline Phosphatase: 83 U/L (ref 38–126)
Anion gap: 9 (ref 5–15)
BUN: 19 mg/dL (ref 8–23)
CHLORIDE: 103 mmol/L (ref 98–111)
CO2: 26 mmol/L (ref 22–32)
Calcium: 8 mg/dL — ABNORMAL LOW (ref 8.9–10.3)
Creatinine, Ser: 0.84 mg/dL (ref 0.44–1.00)
GFR calc Af Amer: >60 mL/min (ref >60)
GFR calc non Af Amer: >60 mL/min (ref >60)
Glucose, Bld: 84 mg/dL (ref 70–99)
Potassium: 4.5 mmol/L (ref 3.5–5.1)
Sodium: 138 mmol/L (ref 135–145)
Total Bilirubin: 0.5 mg/dL (ref 0.3–1.2)
Total Protein: 7.1 g/dL (ref 6.5–8.1)

## 2018-05-22 MED ORDER — FULVESTRANT 250 MG/5ML IM SOLN
500.0000 mg | Freq: Once | INTRAMUSCULAR | Status: AC
  Administered 2018-05-22: 500 mg via INTRAMUSCULAR

## 2018-05-22 MED ORDER — FULVESTRANT 250 MG/5ML IM SOLN
INTRAMUSCULAR | Status: AC
  Filled 2018-05-22: qty 10

## 2018-05-22 NOTE — Telephone Encounter (Signed)
Gave avs and calendar ° °

## 2018-05-23 LAB — CANCER ANTIGEN 27.29: CA 27.29: 69.5 U/mL — ABNORMAL HIGH (ref 0.0–38.6)

## 2018-06-04 MED FILL — IBRANCE 75 MG CAPSULE: 75 | 28 days supply | Qty: 21 | Fill #6

## 2018-06-19 ENCOUNTER — Inpatient Hospital Stay: Payer: Medicare Other

## 2018-06-19 ENCOUNTER — Inpatient Hospital Stay: Payer: Medicare Other | Attending: Oncology

## 2018-06-19 VITALS — BP 133/71 | HR 73 | Temp 98.0°F | Resp 18

## 2018-06-19 DIAGNOSIS — C771 Secondary and unspecified malignant neoplasm of intrathoracic lymph nodes: Secondary | ICD-10-CM | POA: Insufficient documentation

## 2018-06-19 DIAGNOSIS — C50411 Malignant neoplasm of upper-outer quadrant of right female breast: Secondary | ICD-10-CM

## 2018-06-19 DIAGNOSIS — C7800 Secondary malignant neoplasm of unspecified lung: Secondary | ICD-10-CM | POA: Insufficient documentation

## 2018-06-19 DIAGNOSIS — Z5111 Encounter for antineoplastic chemotherapy: Secondary | ICD-10-CM | POA: Diagnosis not present

## 2018-06-19 DIAGNOSIS — C7971 Secondary malignant neoplasm of right adrenal gland: Secondary | ICD-10-CM | POA: Insufficient documentation

## 2018-06-19 DIAGNOSIS — Z853 Personal history of malignant neoplasm of breast: Secondary | ICD-10-CM

## 2018-06-19 DIAGNOSIS — C7951 Secondary malignant neoplasm of bone: Secondary | ICD-10-CM | POA: Diagnosis not present

## 2018-06-19 DIAGNOSIS — C50911 Malignant neoplasm of unspecified site of right female breast: Secondary | ICD-10-CM

## 2018-06-19 LAB — CBC WITH DIFFERENTIAL/PLATELET
Abs Immature Granulocytes: 0.01 10*3/uL (ref 0.00–0.07)
Basophils Absolute: 0.1 10*3/uL (ref 0.0–0.1)
Basophils Relative: 2 %
Eosinophils Absolute: 0.1 10*3/uL (ref 0.0–0.5)
Eosinophils Relative: 5 %
HCT: 35.1 % — ABNORMAL LOW (ref 36.0–46.0)
HEMOGLOBIN: 11.9 g/dL — AB (ref 12.0–15.0)
Immature Granulocytes: 0 %
LYMPHS PCT: 36 %
Lymphs Abs: 0.9 10*3/uL (ref 0.7–4.0)
MCH: 37.9 pg — ABNORMAL HIGH (ref 26.0–34.0)
MCHC: 33.9 g/dL (ref 30.0–36.0)
MCV: 111.8 fL — ABNORMAL HIGH (ref 80.0–100.0)
Monocytes Absolute: 0.2 10*3/uL (ref 0.1–1.0)
Monocytes Relative: 8 %
NEUTROS ABS: 1.3 10*3/uL — AB (ref 1.7–7.7)
Neutrophils Relative %: 49 %
Platelets: 237 10*3/uL (ref 150–400)
RBC: 3.14 MIL/uL — ABNORMAL LOW (ref 3.87–5.11)
RDW: 12.7 % (ref 11.5–15.5)
WBC: 2.6 10*3/uL — ABNORMAL LOW (ref 4.0–10.5)
nRBC: 0 % (ref 0.0–0.2)

## 2018-06-19 LAB — COMPREHENSIVE METABOLIC PANEL
ALT: 13 U/L (ref 0–44)
AST: 19 U/L (ref 15–41)
Albumin: 3.7 g/dL (ref 3.5–5.0)
Alkaline Phosphatase: 92 U/L (ref 38–126)
Anion gap: 6 (ref 5–15)
BUN: 18 mg/dL (ref 8–23)
CO2: 28 mmol/L (ref 22–32)
Calcium: 8.9 mg/dL (ref 8.9–10.3)
Chloride: 103 mmol/L (ref 98–111)
Creatinine, Ser: 0.83 mg/dL (ref 0.44–1.00)
GFR calc Af Amer: >60 mL/min (ref >60)
GFR calc non Af Amer: >60 mL/min (ref >60)
GLUCOSE: 93 mg/dL (ref 70–99)
Potassium: 4.6 mmol/L (ref 3.5–5.1)
Sodium: 137 mmol/L (ref 135–145)
Total Bilirubin: 0.4 mg/dL (ref 0.3–1.2)
Total Protein: 7 g/dL (ref 6.5–8.1)

## 2018-06-19 MED ORDER — FULVESTRANT 250 MG/5ML IM SOLN
INTRAMUSCULAR | Status: AC
  Filled 2018-06-19: qty 10

## 2018-06-19 MED ORDER — FULVESTRANT 250 MG/5ML IM SOLN
500.0000 mg | Freq: Once | INTRAMUSCULAR | Status: AC
  Administered 2018-06-19: 500 mg via INTRAMUSCULAR

## 2018-06-19 NOTE — Patient Instructions (Signed)

## 2018-06-20 LAB — CANCER ANTIGEN 27.29: CAN 27.29: 73.1 U/mL — AB (ref 0.0–38.6)

## 2018-07-02 ENCOUNTER — Other Ambulatory Visit: Payer: Self-pay | Admitting: Oncology

## 2018-07-04 MED FILL — IBRANCE 75 MG CAPSULE: 75 | 28 days supply | Qty: 21 | Fill #0

## 2018-07-16 NOTE — Progress Notes (Signed)
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   She continues on fulvestrant. She tolerates this well. She reports itching to the injection site sometimes, and occasional pinches in her lower extremities.  The patient also continues on palbociclib, currently at 75 mg daily, 21 days on, 7 days off. She tolerates this well. She notes occasional nausea, which is relieved with ginger candies.  Results for ROWENE, SUTO (MRN [id number]) as of 07/16/2018 16:03  Ref. Range 03/26/2018 11:33 04/24/2018 08:14 05/22/2018 08:06 06/19/2018 08:26  CA 27.29 Latest Ref Range: 0.0 - 38.6 U/mL 73.9 (H) 68.2 (H) 69.5 (H) 73.1 (H)   Since her last visit here, she has not undergone any additional studies.  Her last restaging studies, consisting of a chest CT and a whole body bone scan, were on 03/17/2018 and were essentially stable.   REVIEW OF SYSTEMS: Alaze reports chronic sinus issues, for which she takes Claritin. Otherwise, she feels well overall. She reports her husband is currently suffering from allergies and could not join her today. She states he gets a cough with his allergies, and she reports he had a fever that has resolved. The patient denies unusual  headaches, visual changes, nausea, vomiting, stiff neck, dizziness, or gait imbalance. There has been no cough, phlegm production, or pleurisy, no chest pain or pressure, and no change in bowel or bladder habits. The patient denies fever, rash, bleeding, unexplained fatigue or unexplained weight loss. A detailed review of systems was otherwise entirely negative.    BREAST CANCER HISTORY: From the earlier summary note  Lorea tells me in 2007 while living in [place] she was found to have a very small cancer in the upper-outer quadrant of the right breast, about the size of the P, noted on mammography. It was not palpable. She had a right lumpectomy and full sentinel lymph node sampling. She then received adjuvant radiation, to a total of 33 treatments. She received no systemic therapy.  She then did well until December 2014, when she had a fall and complain of pain in her left ribs. Rib films and chest x-ray on 04/13/2013 showed no fractures, but CT of the abdomen and pelvis on the same day found subcarinal and right hilar adenopathy. This was followed up with a chest CT scan 05/05/2013 confirming extensive mediastinal and right hilar lymphadenopathy, with multiple pulmonary nodules, the largest measuring 0.9 cm. PET scan 05/21/2013 showed hypermetabolic adenopathy in the right and left paratracheal areas, the precarinal, subcarinal and right hilar areas, but no involvement of the liver, and the lung nodules were not hypermetabolic (although they were below the level of reliable detection). In addition, at L5 there was a lucency measuring 1.2 cm.  The PET scan also showed a hypermetabolic focus on the thyroid gland, which was evaluated with neck ultrasound and biopsy 97/28/2060, showing a follicular lesion of  undetermined significance (([id number]).  The patient was referred to pulmonary [Dr Melvyn Novas and Dr Julien Nordmann for further evaluation. Repeat PET scan 12/28/2013 showed, in addition to the adenopathy  previously noted, now multiple bony metastases. On 01/07/2014 the patient underwent bronchoscopy and this showed ([id number], together with separate cytology] a low-grade mucinous invasive breast cancer, estrogen receptor 100% positive, progesterone receptor 14% positive, with no HER-2 amplification, the signals ratio being 1.30 and the number per cell 1.95.  The patient was started on anastrozole 01/22/2014; monthly denosumab/Xgeva was added 07/30/2014. She appeared to tolerate this well, and her CA-27-29 (127 at baseline) normalized. Most recent CT scans of chest abdomen and pelvis 10/03/2015 showed continuing response. Despite this good news however, the patient decided to go off anastrozole and denosumab/Xgeva as of June 2017. By the time she saw Dr. Earlie Server again in September 2017 her tumor marker had doubled. At that time the patient was referred to the breast clinic for a second opinion regarding further evaluation and treatment.   PAST MEDICAL HISTORY: Past Medical History:  Diagnosis Date  . Allergy   . Anxiety   . Bone cancer ([place]) mri 11/11/14   right parietal bone,left cribriform plate metastases  . Cancer [facility]) 2007   right breat ca  , lumpectomy and radiation tx (declined chemo and additional prophylactic meds)  . Cataract    both eyes  10/2016 Lt.     04/2017 Rt.   . CEREBROVASCULAR DISEASE 03/03/2010  . Diverticulitis   . DIVERTICULOSIS, COLON 03/03/2010  . Fatty liver 08/02/09   as per U/S done by [facility]  . Foramen ovale    positive bubble study  . GERD (gastroesophageal reflux disease)   . Headache(784.0)    she thinks sinus headaches  . History of cerebral artery stenosis    right middle  . HYPERLIPIDEMIA 03/03/2010  . Hypertension   . HYPOTHYROIDISM 03/03/2010   no longer on meds  . Internal hemorrhoid   . Low back pain   . Mastoiditis    noted on brain MRI  . Rib fracture   . Right leg pain   . Shortness of breath   . Stroke [facility]) Oct. 2011    TIA  . Ulcer     PAST SURGICAL HISTORY: Past Surgical History:  Procedure Laterality Date  . BREAST LUMPECTOMY     right  . CATARACT EXTRACTION Left    10/2016   Rt 04/2017  . CHOLECYSTECTOMY    . COLONOSCOPY    . POLYPECTOMY     benign cecum  . SHOULDER SURGERY     right shoulder (dislocation)  . TONSILLECTOMY    . TUBAL LIGATION    . UPPER GASTROINTESTINAL ENDOSCOPY    . VIDEO BRONCHOSCOPY WITH ENDOBRONCHIAL ULTRASOUND N/A 01/07/2014   Procedure: VIDEO BRONCHOSCOPY WITH ENDOBRONCHIAL ULTRASOUND;  Surgeon: Ivin Poot, MD;  Location[facility] OR;  Service: Thoracic;  Laterality: N/A;    FAMILY HISTORY Family History  Problem Relation Age of Onset  . Heart disease Sister        cerebral vascular disease also  . Heart disease Brother        cerebral vascular disease also  . Heart disease Brother        cerebral vascular disease  . Heart disease Sister        cebreal vascular disease also  . Heart disease Sister        cebreal vascular diease also  . Heart disease Sister  cerebral vascular diease also  . Stomach cancer Father   . Colon cancer Neg Hx   . Esophageal cancer Neg Hx   . Rectal cancer Neg Hx   . Colon polyps Neg Hx   . Asthma Neg Hx   The patient's father died from stomach cancer in his late 69s. The patient's mother died in her 79s from a stroke. The patient has 2 brothers, 4 sisters. One sister had leukemia. There is no history of breast, colon, or ovarian cancer in the family to her knowledge   GYNECOLOGIC HISTORY:  No LMP recorded. Patient is postmenopausal. Menarche age 29, first live birth age 46, the patient is [place] P2. She went through menopause in her late 40s. She took no hormone replacement. She took oral contraceptives for approximately 2 years remotely without complications.   SOCIAL HISTORY: (updated 05/22/2018) Betta is originally from [place], [place]. She worked as a Education officer, museum, particularly, in the field of substance abuse.  Her husband, Mortimer Fries, is a retired substance abuse Social worker. This is a second marriage for both of them. Riely has 2 children from her first marriage, [name], lives in [place], and worked as a Audiological scientist but is now disabled, and Lawrence Santiago, who lives in [place] and works in Engineer, mining. Mikki Santee has 3 children from his first marriage, Tanna Furry, [place], and [place]. [name] Junior lives in [place] and has his own trucking business. Mikki Santee tells me he is estranged from his 2 daughters Amedeo Gory (a retired Pharmacist, hospital) and Salena Saner (who works in a bank). They are living on [place].The patient has 8 grandchildren. Keoni attends [employer].   ADVANCED DIRECTIVES: in place   HEALTH MAINTENANCE: Social History   Tobacco Use  . Smoking status: Former Smoker    Packs/day: 0.50    Years: 20.00    Pack years: 10.00    Types: Cigarettes    Last attempt to quit: 05/01/1991    Years since quitting: 27.2  . Smokeless tobacco: Never Used  Substance Use Topics  . Alcohol use: No    Alcohol/week: 0.0 standard drinks  . Drug use: No     Colonoscopy:  PAP:  Bone density:   Allergies  Allergen Reactions  . Ciprofloxacin Anaphylaxis    Current Outpatient Medications  Medication Sig Dispense Refill  . ALPRAZolam (XANAX) 0.25 MG tablet TAKE 1 TABLET BY MOUTH THREE TIMES DAILY AS NEEDED 90 tablet 2  . aspirin EC 81 MG tablet Take 81 mg by mouth daily after breakfast.     . CARTIA XT 120 MG 24 hr capsule TAKE 1 CAPSULE(120 MG) BY MOUTH DAILY 90 capsule 0  . Cholecalciferol (VITAMIN D PO) Take 1,000 Units by mouth 2 (two) times daily.    Marland Kitchen docusate sodium (COLACE) 100 MG capsule Take 100 mg by mouth daily as needed for mild constipation.    . FULVESTRANT IM Inject into the muscle. Takes every 28 days    . Glucosamine 500 MG CAPS Take 500 mg by mouth daily.     Leslee Home 75 MG capsule TAKE 1 CAPSULE (75 MG TOTAL) BY MOUTH DAILY WITH BREAKFAST. TAKE WHOLE WITH FOOD. TAKE FOR 21 DAYS  ON, 7 DAYS OFF, REPEAT EVERY 28 DAYS. 21 capsule 6  . loratadine (CLARITIN) 10 MG tablet Take 1 tablet (10 mg total) by mouth daily. 30 tablet 11  . OVER THE COUNTER MEDICATION Beno for gas, takes twice daily    . Probiotic Product (PROBIOTIC DAILY PO)  Take 1 tablet by mouth daily.      Current Facility-Administered Medications  Medication Dose Route Frequency Provider Last Rate Last Dose  . 0.9 %  sodium chloride infusion  500 mL Intravenous Once Armbruster, Carlota Raspberry, MD        OBJECTIVE: Middle-aged Latin American woman   Vitals:   07/17/18 0825  BP: 117/72  Pulse: 77  Resp: 18  Temp: 97.9 F (36.6 C)  SpO2: 96%     Body mass index is 23.41 kg/m.    ECOG FS:0 - Asymptomatic  Sclerae unicteric, EOMs intact Oropharynx clear and moist No cervical or supraclavicular adenopathy Lungs no rales or rhonchi Heart regular rate and rhythm Abd soft, nontender, positive bowel sounds MSK no focal spinal tenderness, no upper extremity lymphedema Neuro: nonfocal, well oriented, appropriate affect Breasts: Deferred  LAB RESULTS:  CMP     Component Value Date/Time   NA 137 06/19/2018 0826   NA 138 04/18/2017 0853   K 4.6 06/19/2018 0826   K 4.4 04/18/2017 0853   CL 103 06/19/2018 0826   CO2 28 06/19/2018 0826   CO2 29 04/18/2017 0853   GLUCOSE 93 06/19/2018 0826   GLUCOSE 95 04/18/2017 0853   BUN 18 06/19/2018 0826   BUN 16.6 04/18/2017 0853   CREATININE 0.83 06/19/2018 0826   CREATININE 0.8 04/18/2017 0853   CALCIUM 8.9 06/19/2018 0826   CALCIUM 9.2 04/18/2017 0853   PROT 7.0 06/19/2018 0826   PROT 7.0 04/18/2017 0853   ALBUMIN 3.7 06/19/2018 0826   ALBUMIN 3.8 04/18/2017 0853   AST 19 06/19/2018 0826   AST 17 04/18/2017 0853   ALT 13 06/19/2018 0826   ALT 16 04/18/2017 0853   ALKPHOS 92 06/19/2018 0826   ALKPHOS 90 04/18/2017 0853   BILITOT 0.4 06/19/2018 0826   BILITOT 0.63 04/18/2017 0853   GFRNONAA >60 06/19/2018 0826   GFRAA >60 06/19/2018 0826    INo  results found for: SPEP, UPEP  Lab Results  Component Value Date   WBC 2.7 (L) 07/17/2018   NEUTROABS PENDING 07/17/2018   HGB 12.4 07/17/2018   HCT 36.7 07/17/2018   MCV 111.6 (H) 07/17/2018   PLT 240 07/17/2018      Chemistry      Component Value Date/Time   NA 137 06/19/2018 0826   NA 138 04/18/2017 0853   K 4.6 06/19/2018 0826   K 4.4 04/18/2017 0853   CL 103 06/19/2018 0826   CO2 28 06/19/2018 0826   CO2 29 04/18/2017 0853   BUN 18 06/19/2018 0826   BUN 16.6 04/18/2017 0853   CREATININE 0.83 06/19/2018 0826   CREATININE 0.8 04/18/2017 0853      Component Value Date/Time   CALCIUM 8.9 06/19/2018 0826   CALCIUM 9.2 04/18/2017 0853   ALKPHOS 92 06/19/2018 0826   ALKPHOS 90 04/18/2017 0853   AST 19 06/19/2018 0826   AST 17 04/18/2017 0853   ALT 13 06/19/2018 0826   ALT 16 04/18/2017 0853   BILITOT 0.4 06/19/2018 0826   BILITOT 0.63 04/18/2017 0853      Most recent CA-27-29 had risen to 57.6.  This is being repeated today  No components found for: LABCA125  No results for input(s): INR in the last 168 hours.  Urinalysis    Component Value Date/Time   COLORURINE STRAW (A) 11/19/2016 1750   APPEARANCEUR CLEAR 11/19/2016 1750   LABSPEC 1.003 (L) 11/19/2016 1750   PHURINE 7.0 11/19/2016 1750   GLUCOSEU NEGATIVE 11/19/2016 1750  HGBUR NEGATIVE 11/19/2016 1750   BILIRUBINUR NEGATIVE 11/19/2016 1750   BILIRUBINUR neg 02/16/2015 1318   KETONESUR NEGATIVE 11/19/2016 1750   PROTEINUR NEGATIVE 11/19/2016 1750   UROBILINOGEN 0.2 02/16/2015 1318   UROBILINOGEN 0.2 11/18/2013 1321   NITRITE NEGATIVE 11/19/2016 1750   LEUKOCYTESUR TRACE (A) 11/19/2016 1750     STUDIES: No results found.   ELIGIBLE FOR AVAILABLE RESEARCH PROTOCOL: no  ASSESSMENT: 78 y.o. [place] woman  (1) status post right breast upper outer quadrant lumpectomy and axillary lymph node dissection in 2007 for what appears to have been a T1 N0, stage IA invasive ductal carcinoma, treated  adjuvantly with radiation (33 sessions)  METASTATIC DISEASE definitively documented Sept 2015 (2) bronchoscopic biopsy 01/07/2014 showed a low-grade mucinous breast cancer, strongly estrogen receptor positive, progesterone receptor positive, HER-2 negative; staging studies confirmed extensive hypermetabolic adenopathy, multiple bone lesions, likely early lung involvement, but no liver lesions.  (a) bone scan and chest CT 07/09/2016 shows stable disease.  (b) CA-27-29 is moderately informative  (c) bone scan and CT scan of the chest 04/24/2017 shows essentially stable disease  (3) on Arimidex between September 2015 and June 2017, with evidence of response; discontinued secondary to side effects  (a) bone density 04/10/2016 shows osteopenia with a T score of -2.1  (4) on monthly denosumab/Xgeva between 07/30/2014 and 10/04/2015, discontinued due to patient's concerns regarding osteonecrosis of the jaw  (a) discussed again October 2017, the patient adamantly refusing denosumab  (5) started fulvestrant 01/26/2016    (a) Palbociclib added at 75 mg M/W/F, beginning mid February 2018  (b) chest CT scan obtained 12/02/2017 shows small but measurable growth of lung lesions; bones are stable  (c) palbociclib dose increased to 75 mg daily 21/7 beginning 12/23/2017   PLAN Espiritu is now 4-1/2 years out from definitive diagnosis of metastatic breast cancer.  She has very well-controlled disease.  This is very favorable.  She is tolerating the fulvestrant with minimal side effects.  She is doing terrific on the palbociclib.  Her counts remain very manageable.  As far as her breast cancer is concerned we are continuing the treatments every 28 days.  We are going to broaden the visit interval to limit coronavirus exposure so I will see her again in 12 weeks instead of 8 weeks.  She is very reluctant to have any restaging studies and if her lab work continues as favorable as before including the tumor  marker we likely will not do bone scan and CT of the chest until November  She tells me her husband who is older than she has a cough and a mild fever.  I told her she really needs to direct him to his doctor and I gave her information on how he can get tested  She knows to call for any other issue that may develop before the next visit.  Sameena Artus, Virgie Dad, MD  07/17/18 8:45 AM Medical Oncology and Hematology [facility] [address] [place], [place] [address] Tel. [phone]    Fax. [phone]  I, Wilburn Mylar, am acting as scribe for Dr. Virgie Dad. Shyane Fossum.  I, Lurline Del MD, have reviewed the above documentation for accuracy and completeness, and I agree with the above.

## 2018-07-17 ENCOUNTER — Inpatient Hospital Stay: Payer: Medicare Other | Attending: Oncology

## 2018-07-17 ENCOUNTER — Other Ambulatory Visit: Payer: Medicare Other

## 2018-07-17 ENCOUNTER — Telehealth: Payer: Self-pay | Admitting: Oncology

## 2018-07-17 ENCOUNTER — Inpatient Hospital Stay: Payer: Medicare Other

## 2018-07-17 ENCOUNTER — Other Ambulatory Visit: Payer: Self-pay

## 2018-07-17 ENCOUNTER — Inpatient Hospital Stay (HOSPITAL_BASED_OUTPATIENT_CLINIC_OR_DEPARTMENT_OTHER): Payer: Medicare Other | Admitting: Oncology

## 2018-07-17 VITALS — BP 135/73 | HR 76 | Temp 97.7°F | Resp 18

## 2018-07-17 VITALS — BP 117/72 | HR 77 | Temp 97.9°F | Resp 18 | Ht 62.0 in | Wt 128.0 lb

## 2018-07-17 DIAGNOSIS — C771 Secondary and unspecified malignant neoplasm of intrathoracic lymph nodes: Secondary | ICD-10-CM | POA: Insufficient documentation

## 2018-07-17 DIAGNOSIS — Z170 Estrogen receptor positive status [ER+]: Secondary | ICD-10-CM

## 2018-07-17 DIAGNOSIS — C7971 Secondary malignant neoplasm of right adrenal gland: Secondary | ICD-10-CM | POA: Insufficient documentation

## 2018-07-17 DIAGNOSIS — Z87891 Personal history of nicotine dependence: Secondary | ICD-10-CM

## 2018-07-17 DIAGNOSIS — C50411 Malignant neoplasm of upper-outer quadrant of right female breast: Secondary | ICD-10-CM

## 2018-07-17 DIAGNOSIS — Z5111 Encounter for antineoplastic chemotherapy: Secondary | ICD-10-CM | POA: Diagnosis not present

## 2018-07-17 DIAGNOSIS — R110 Nausea: Secondary | ICD-10-CM

## 2018-07-17 DIAGNOSIS — C50911 Malignant neoplasm of unspecified site of right female breast: Secondary | ICD-10-CM

## 2018-07-17 DIAGNOSIS — C7800 Secondary malignant neoplasm of unspecified lung: Secondary | ICD-10-CM | POA: Insufficient documentation

## 2018-07-17 DIAGNOSIS — C7951 Secondary malignant neoplasm of bone: Secondary | ICD-10-CM

## 2018-07-17 DIAGNOSIS — Z853 Personal history of malignant neoplasm of breast: Secondary | ICD-10-CM

## 2018-07-17 LAB — CBC WITH DIFFERENTIAL/PLATELET
Abs Immature Granulocytes: 0.01 10*3/uL (ref 0.00–0.07)
Basophils Absolute: 0.1 10*3/uL (ref 0.0–0.1)
Basophils Relative: 2 %
Eosinophils Absolute: 0.2 10*3/uL (ref 0.0–0.5)
Eosinophils Relative: 6 %
HCT: 36.7 % (ref 36.0–46.0)
Hemoglobin: 12.4 g/dL (ref 12.0–15.0)
Immature Granulocytes: 0 %
Lymphocytes Relative: 36 %
Lymphs Abs: 1 10*3/uL (ref 0.7–4.0)
MCH: 37.7 pg — ABNORMAL HIGH (ref 26.0–34.0)
MCHC: 33.8 g/dL (ref 30.0–36.0)
MCV: 111.6 fL — ABNORMAL HIGH (ref 80.0–100.0)
MONOS PCT: 6 %
Monocytes Absolute: 0.2 10*3/uL (ref 0.1–1.0)
NEUTROS ABS: 1.4 10*3/uL — AB (ref 1.7–7.7)
Neutrophils Relative %: 50 %
Platelets: 240 10*3/uL (ref 150–400)
RBC: 3.29 MIL/uL — ABNORMAL LOW (ref 3.87–5.11)
RDW: 12.6 % (ref 11.5–15.5)
WBC: 2.7 10*3/uL — ABNORMAL LOW (ref 4.0–10.5)
nRBC: 0 % (ref 0.0–0.2)

## 2018-07-17 LAB — COMPREHENSIVE METABOLIC PANEL
ALT: 17 U/L (ref 0–44)
AST: 17 U/L (ref 15–41)
Albumin: 3.6 g/dL (ref 3.5–5.0)
Alkaline Phosphatase: 93 U/L (ref 38–126)
Anion gap: 10 (ref 5–15)
BUN: 17 mg/dL (ref 8–23)
CHLORIDE: 103 mmol/L (ref 98–111)
CO2: 27 mmol/L (ref 22–32)
CREATININE: 0.87 mg/dL (ref 0.44–1.00)
Calcium: 8.8 mg/dL — ABNORMAL LOW (ref 8.9–10.3)
GFR calc Af Amer: >60 mL/min (ref >60)
GFR calc non Af Amer: >60 mL/min (ref >60)
Glucose, Bld: 89 mg/dL (ref 70–99)
Potassium: 4.5 mmol/L (ref 3.5–5.1)
Sodium: 140 mmol/L (ref 135–145)
Total Bilirubin: 0.6 mg/dL (ref 0.3–1.2)
Total Protein: 6.9 g/dL (ref 6.5–8.1)

## 2018-07-17 MED ORDER — FULVESTRANT 250 MG/5ML IM SOLN
INTRAMUSCULAR | Status: AC
  Filled 2018-07-17: qty 5

## 2018-07-17 MED ORDER — FULVESTRANT 250 MG/5ML IM SOLN
500.0000 mg | Freq: Once | INTRAMUSCULAR | Status: AC
  Administered 2018-07-17: 500 mg via INTRAMUSCULAR

## 2018-07-17 NOTE — Telephone Encounter (Signed)
Gave avs and calendar ° °

## 2018-07-17 NOTE — Patient Instructions (Signed)

## 2018-07-18 LAB — CANCER ANTIGEN 27.29: CA 27.29: 76.7 U/mL — ABNORMAL HIGH (ref 0.0–38.6)

## 2018-07-28 MED FILL — IBRANCE 75 MG CAPSULE: 75 | 28 days supply | Qty: 21 | Fill #1

## 2018-08-11 ENCOUNTER — Other Ambulatory Visit: Payer: Self-pay | Admitting: Internal Medicine

## 2018-08-11 DIAGNOSIS — C50411 Malignant neoplasm of upper-outer quadrant of right female breast: Secondary | ICD-10-CM

## 2018-08-11 DIAGNOSIS — Z170 Estrogen receptor positive status [ER+]: Principal | ICD-10-CM

## 2018-08-12 MED ORDER — ALPRAZOLAM 0.25 MG PO TABS: 0.2500 mg | ORAL_TABLET | Freq: Three times a day (TID) | ORAL | 2 refills | Status: DC | PRN

## 2018-08-14 ENCOUNTER — Other Ambulatory Visit: Payer: Self-pay

## 2018-08-14 ENCOUNTER — Inpatient Hospital Stay: Payer: Medicare Other | Attending: Oncology

## 2018-08-14 ENCOUNTER — Inpatient Hospital Stay: Payer: Medicare Other

## 2018-08-14 VITALS — BP 131/67 | HR 83 | Temp 98.4°F | Resp 18

## 2018-08-14 DIAGNOSIS — C50411 Malignant neoplasm of upper-outer quadrant of right female breast: Secondary | ICD-10-CM

## 2018-08-14 DIAGNOSIS — C7951 Secondary malignant neoplasm of bone: Secondary | ICD-10-CM

## 2018-08-14 DIAGNOSIS — C7800 Secondary malignant neoplasm of unspecified lung: Secondary | ICD-10-CM | POA: Diagnosis not present

## 2018-08-14 DIAGNOSIS — C7971 Secondary malignant neoplasm of right adrenal gland: Secondary | ICD-10-CM | POA: Diagnosis not present

## 2018-08-14 DIAGNOSIS — Z5111 Encounter for antineoplastic chemotherapy: Secondary | ICD-10-CM | POA: Insufficient documentation

## 2018-08-14 DIAGNOSIS — Z853 Personal history of malignant neoplasm of breast: Secondary | ICD-10-CM

## 2018-08-14 DIAGNOSIS — C771 Secondary and unspecified malignant neoplasm of intrathoracic lymph nodes: Secondary | ICD-10-CM | POA: Insufficient documentation

## 2018-08-14 DIAGNOSIS — C50911 Malignant neoplasm of unspecified site of right female breast: Secondary | ICD-10-CM

## 2018-08-14 LAB — COMPREHENSIVE METABOLIC PANEL
ALT: 18 U/L (ref 0–44)
AST: 26 U/L (ref 15–41)
Albumin: 3.6 g/dL (ref 3.5–5.0)
Alkaline Phosphatase: 76 U/L (ref 38–126)
Anion gap: 8 (ref 5–15)
BUN: 16 mg/dL (ref 8–23)
CO2: 24 mmol/L (ref 22–32)
Calcium: 8.8 mg/dL — ABNORMAL LOW (ref 8.9–10.3)
Chloride: 107 mmol/L (ref 98–111)
Creatinine, Ser: 0.83 mg/dL (ref 0.44–1.00)
GFR calc Af Amer: >60 mL/min (ref >60)
GFR calc non Af Amer: >60 mL/min (ref >60)
Glucose, Bld: 97 mg/dL (ref 70–99)
Potassium: 4.6 mmol/L (ref 3.5–5.1)
Sodium: 139 mmol/L (ref 135–145)
Total Bilirubin: 0.4 mg/dL (ref 0.3–1.2)
Total Protein: 6.9 g/dL (ref 6.5–8.1)

## 2018-08-14 LAB — CBC WITH DIFFERENTIAL/PLATELET
Abs Immature Granulocytes: 0.01 10*3/uL (ref 0.00–0.07)
Basophils Absolute: 0.1 10*3/uL (ref 0.0–0.1)
Basophils Relative: 3 %
Eosinophils Absolute: 0.1 10*3/uL (ref 0.0–0.5)
Eosinophils Relative: 4 %
HCT: 36.9 % (ref 36.0–46.0)
Hemoglobin: 12.5 g/dL (ref 12.0–15.0)
Immature Granulocytes: 0 %
Lymphocytes Relative: 26 %
Lymphs Abs: 0.7 10*3/uL (ref 0.7–4.0)
MCH: 37.3 pg — ABNORMAL HIGH (ref 26.0–34.0)
MCHC: 33.9 g/dL (ref 30.0–36.0)
MCV: 110.1 fL — ABNORMAL HIGH (ref 80.0–100.0)
Monocytes Absolute: 0.2 10*3/uL (ref 0.1–1.0)
Monocytes Relative: 6 %
Neutro Abs: 1.7 10*3/uL (ref 1.7–7.7)
Neutrophils Relative %: 61 %
Platelets: 224 10*3/uL (ref 150–400)
RBC: 3.35 MIL/uL — ABNORMAL LOW (ref 3.87–5.11)
RDW: 12.5 % (ref 11.5–15.5)
WBC: 2.8 10*3/uL — ABNORMAL LOW (ref 4.0–10.5)
nRBC: 0 % (ref 0.0–0.2)

## 2018-08-14 MED ORDER — FULVESTRANT 250 MG/5ML IM SOLN
500.0000 mg | Freq: Once | INTRAMUSCULAR | Status: AC
  Administered 2018-08-14: 10:00:00 500 mg via INTRAMUSCULAR

## 2018-08-14 MED ORDER — FULVESTRANT 250 MG/5ML IM SOLN
INTRAMUSCULAR | Status: AC
  Filled 2018-08-14: qty 5

## 2018-08-14 NOTE — Patient Instructions (Signed)

## 2018-08-15 LAB — CANCER ANTIGEN 27.29: CA 27.29: 73.9 U/mL — ABNORMAL HIGH (ref 0.0–38.6)

## 2018-08-26 MED FILL — IBRANCE 75 MG CAPSULE: 75 | 28 days supply | Qty: 21 | Fill #2

## 2018-09-04 ENCOUNTER — Telehealth: Payer: Self-pay | Admitting: Oncology

## 2018-09-04 NOTE — Telephone Encounter (Signed)
Returned patient's phone call regarding rescheduling an appointment, patient confirmed date and time.

## 2018-09-11 ENCOUNTER — Ambulatory Visit: Payer: Medicare Other

## 2018-09-11 ENCOUNTER — Inpatient Hospital Stay: Payer: Medicare Other

## 2018-09-11 ENCOUNTER — Other Ambulatory Visit: Payer: Self-pay

## 2018-09-11 ENCOUNTER — Inpatient Hospital Stay: Payer: Medicare Other | Attending: Oncology

## 2018-09-11 ENCOUNTER — Other Ambulatory Visit: Payer: Medicare Other

## 2018-09-11 DIAGNOSIS — C7951 Secondary malignant neoplasm of bone: Secondary | ICD-10-CM

## 2018-09-11 DIAGNOSIS — C7800 Secondary malignant neoplasm of unspecified lung: Secondary | ICD-10-CM | POA: Diagnosis not present

## 2018-09-11 DIAGNOSIS — C771 Secondary and unspecified malignant neoplasm of intrathoracic lymph nodes: Secondary | ICD-10-CM | POA: Diagnosis not present

## 2018-09-11 DIAGNOSIS — Z853 Personal history of malignant neoplasm of breast: Secondary | ICD-10-CM

## 2018-09-11 DIAGNOSIS — Z5111 Encounter for antineoplastic chemotherapy: Secondary | ICD-10-CM | POA: Diagnosis not present

## 2018-09-11 DIAGNOSIS — C50411 Malignant neoplasm of upper-outer quadrant of right female breast: Secondary | ICD-10-CM

## 2018-09-11 DIAGNOSIS — C7971 Secondary malignant neoplasm of right adrenal gland: Secondary | ICD-10-CM | POA: Insufficient documentation

## 2018-09-11 DIAGNOSIS — C50911 Malignant neoplasm of unspecified site of right female breast: Secondary | ICD-10-CM

## 2018-09-11 LAB — COMPREHENSIVE METABOLIC PANEL
ALT: 14 U/L (ref 0–44)
AST: 16 U/L (ref 15–41)
Albumin: 3.7 g/dL (ref 3.5–5.0)
Alkaline Phosphatase: 89 U/L (ref 38–126)
Anion gap: 8 (ref 5–15)
BUN: 16 mg/dL (ref 8–23)
CO2: 27 mmol/L (ref 22–32)
Calcium: 8.8 mg/dL — ABNORMAL LOW (ref 8.9–10.3)
Chloride: 102 mmol/L (ref 98–111)
Creatinine, Ser: 0.92 mg/dL (ref 0.44–1.00)
GFR calc Af Amer: >60 mL/min (ref >60)
GFR calc non Af Amer: 60 mL/min — ABNORMAL LOW (ref >60)
Glucose, Bld: 165 mg/dL — ABNORMAL HIGH (ref 70–99)
Potassium: 4.1 mmol/L (ref 3.5–5.1)
Sodium: 137 mmol/L (ref 135–145)
Total Bilirubin: 0.3 mg/dL (ref 0.3–1.2)
Total Protein: 7.1 g/dL (ref 6.5–8.1)

## 2018-09-11 LAB — CBC WITH DIFFERENTIAL/PLATELET
Abs Immature Granulocytes: 0 10*3/uL (ref 0.00–0.07)
Basophils Absolute: 0.1 10*3/uL (ref 0.0–0.1)
Basophils Relative: 2 %
Eosinophils Absolute: 0.1 10*3/uL (ref 0.0–0.5)
Eosinophils Relative: 4 %
HCT: 36 % (ref 36.0–46.0)
Hemoglobin: 12.4 g/dL (ref 12.0–15.0)
Immature Granulocytes: 0 %
Lymphocytes Relative: 39 %
Lymphs Abs: 1.3 10*3/uL (ref 0.7–4.0)
MCH: 37.3 pg — ABNORMAL HIGH (ref 26.0–34.0)
MCHC: 34.4 g/dL (ref 30.0–36.0)
MCV: 108.4 fL — ABNORMAL HIGH (ref 80.0–100.0)
Monocytes Absolute: 0.2 10*3/uL (ref 0.1–1.0)
Monocytes Relative: 5 %
Neutro Abs: 1.6 10*3/uL — ABNORMAL LOW (ref 1.7–7.7)
Neutrophils Relative %: 50 %
Platelets: 236 10*3/uL (ref 150–400)
RBC: 3.32 MIL/uL — ABNORMAL LOW (ref 3.87–5.11)
RDW: 12 % (ref 11.5–15.5)
WBC: 3.3 10*3/uL — ABNORMAL LOW (ref 4.0–10.5)
nRBC: 0 % (ref 0.0–0.2)

## 2018-09-11 MED ORDER — FULVESTRANT 250 MG/5ML IM SOLN
INTRAMUSCULAR | Status: AC
  Filled 2018-09-11: qty 10

## 2018-09-11 MED ORDER — FULVESTRANT 250 MG/5ML IM SOLN
500.0000 mg | Freq: Once | INTRAMUSCULAR | Status: AC
  Administered 2018-09-11: 500 mg via INTRAMUSCULAR

## 2018-09-11 NOTE — Patient Instructions (Signed)

## 2018-09-12 LAB — CANCER ANTIGEN 27.29: CA 27.29: 83.1 U/mL — ABNORMAL HIGH (ref 0.0–38.6)

## 2018-09-24 ENCOUNTER — Encounter: Payer: Self-pay | Admitting: Pharmacist

## 2018-09-24 ENCOUNTER — Telehealth: Payer: Self-pay

## 2018-09-24 MED FILL — IBRANCE 75 MG CAPSULE: 75 | 28 days supply | Qty: 21 | Fill #3

## 2018-09-24 NOTE — Telephone Encounter (Signed)
[name] Encounter  Was successful in securing patient an $ 8,000 grant from [employer] (PAF) to provide copayment coverage for her Ibrance.  This will keep the out of pocket expense at $0.    I have spoken with the patient.    The billing information is as follows and has been shared with [place].   Member ID: [id number] Group ID: [id number] RxBin: [id number] Dates of Eligibility: 09/24/18 through 09/24/19  Erica Young has been filled at [facility] and will ship to the patient today.  [place] Patient [place] Phone [phone] Fax [phone] 09/24/2018    2:29 PM

## 2018-09-24 NOTE — Telephone Encounter (Signed)
[name] Encounter  [place] notified me that the [place] funds have been exhausted and the [place] copay this month is $608.38. There are no other grant funds available at this time.  I can help the patient apply for manufacturer assistance in an attempt for the patient to receive the Ibrance at $0 out of pocket cost.   I called the patient and went over this with her and her husband, [name]. She will need to start the Ibrance again on Monday so we will give her a sample bottle. They will come in at 3:00 today to get the sample bottle, sign the application and bring me their income documentation.  Mr and Mrs. Jehle verbalized understanding and great appreciation.  This encounter will be updated until final determination.  [employer] Patient [place] Phone [phone] Fax [phone] 09/24/2018   10:20 AM

## 2018-09-24 NOTE — Progress Notes (Signed)
Oral Chemotherapy Pharmacist Encounter  [place] finds that cover out-of-pocket expense for [facility] for patient have been depleted. No other grant foundations with co-pay assistance available at this time. Patient is working with oral oncology patient advocate to complete application for compassionate use program with manufacturer in an effort to receive Ibrance at $0 out-of-pocket cost to patient. Patient is planned to start her next cycle of Ibrance this coming Monday, 09/29/2018, and does not have medicine available.  Dispensed samples to patient for next cycle of Ibrance.  Medication: Ibrance 75 mg capsules Instructions: Take 1 capsule by mouth once daily with food, take for 21 days on, 7 days off, repeat every 28 days Quantity dispensed: 21 Days supply: 28 Manufacturer: Pfizer Lot: [id number] Exp: October 28, 2018  Patient is coming to the office today, 09/24/2018, to sign manufacturer assistance application and bring income documentation to complete manufacturer application. Patient will be provided with bottle of samples at that time.  Johny Drilling, PharmD, BCPS, BCOP  09/24/2018 10:02 AM Oral Oncology Clinic [phone]

## 2018-09-30 ENCOUNTER — Ambulatory Visit: Payer: Medicare Other | Admitting: Internal Medicine

## 2018-10-04 ENCOUNTER — Other Ambulatory Visit: Payer: Self-pay | Admitting: Oncology

## 2018-10-07 ENCOUNTER — Encounter: Payer: Self-pay | Admitting: Internal Medicine

## 2018-10-07 ENCOUNTER — Other Ambulatory Visit: Payer: Self-pay

## 2018-10-07 ENCOUNTER — Ambulatory Visit (INDEPENDENT_AMBULATORY_CARE_PROVIDER_SITE_OTHER): Payer: Medicare Other | Admitting: Internal Medicine

## 2018-10-07 VITALS — BP 118/80 | HR 80 | Temp 98.4°F | Wt 129.1 lb

## 2018-10-07 DIAGNOSIS — C50911 Malignant neoplasm of unspecified site of right female breast: Secondary | ICD-10-CM

## 2018-10-07 DIAGNOSIS — I10 Essential (primary) hypertension: Secondary | ICD-10-CM

## 2018-10-07 DIAGNOSIS — Z170 Estrogen receptor positive status [ER+]: Secondary | ICD-10-CM

## 2018-10-07 DIAGNOSIS — L299 Pruritus, unspecified: Secondary | ICD-10-CM | POA: Diagnosis not present

## 2018-10-07 DIAGNOSIS — E785 Hyperlipidemia, unspecified: Secondary | ICD-10-CM

## 2018-10-07 DIAGNOSIS — F419 Anxiety disorder, unspecified: Secondary | ICD-10-CM | POA: Diagnosis not present

## 2018-10-07 DIAGNOSIS — C50411 Malignant neoplasm of upper-outer quadrant of right female breast: Secondary | ICD-10-CM | POA: Diagnosis not present

## 2018-10-07 MED ORDER — ALPRAZOLAM 0.25 MG PO TABS: 0.2500 mg | ORAL_TABLET | Freq: Three times a day (TID) | ORAL | 2 refills | Status: DC | PRN

## 2018-10-07 MED ORDER — DILTIAZEM HCL ER COATED BEADS 120 MG PO CP24: 120.0000 mg | ORAL_CAPSULE | Freq: Every day | ORAL | 1 refills | Status: DC

## 2018-10-07 NOTE — Progress Notes (Signed)
Established Patient Office Visit     CC/Reason for Visit: 73-month follow-up  HPI: Erica Young is a 78 y.o. female who is coming in today for the above mentioned reasons. Past Medical History is significant for: Hypertension that has been well controlled, hyperlipidemia, stage IV breast cancer followed by Dr. Jana Hakim.  She also has an acute complaint of diffuse skin pruritus that started about 1 week ago and coincided with the use of a new deodorant.  She is concerned that she has not had blood work in about 11 months.  Her last annual wellness visit with blood work was in July 2019.  She is also concerned that she is getting "addicted" to her Ativan as when she misses a dose she becomes shaky.  She also needs refills of her blood pressure and Xanax medication.    Past Medical/Surgical History: Past Medical History:  Diagnosis Date  . Allergy   . Anxiety   . Bone cancer ([place]) mri 11/11/14   right parietal bone,left cribriform plate metastases  . Cancer [facility]) 2007   right breat ca  , lumpectomy and radiation tx (declined chemo and additional prophylactic meds)  . Cataract    both eyes  10/2016 Lt.     04/2017 Rt.   . CEREBROVASCULAR DISEASE 03/03/2010  . Diverticulitis   . DIVERTICULOSIS, COLON 03/03/2010  . Fatty liver 08/02/09   as per U/S done by [facility]  . Foramen ovale    positive bubble study  . GERD (gastroesophageal reflux disease)   . Headache(784.0)    she thinks sinus headaches  . History of cerebral artery stenosis    right middle  . HYPERLIPIDEMIA 03/03/2010  . Hypertension   . HYPOTHYROIDISM 03/03/2010   no longer on meds  . Internal hemorrhoid   . Low back pain   . Mastoiditis    noted on brain MRI  . Rib fracture   . Right leg pain   . Shortness of breath   . Stroke [facility]) Oct. 2011   TIA  . Ulcer     Past Surgical History:  Procedure Laterality Date  . BREAST LUMPECTOMY     right  . CATARACT EXTRACTION Left    10/2016   Rt 04/2017   . CHOLECYSTECTOMY    . COLONOSCOPY    . POLYPECTOMY     benign cecum  . SHOULDER SURGERY     right shoulder (dislocation)  . TONSILLECTOMY    . TUBAL LIGATION    . UPPER GASTROINTESTINAL ENDOSCOPY    . VIDEO BRONCHOSCOPY WITH ENDOBRONCHIAL ULTRASOUND N/A 01/07/2014   Procedure: VIDEO BRONCHOSCOPY WITH ENDOBRONCHIAL ULTRASOUND;  Surgeon: Ivin Poot, MD;  Location: [place];  Service: Thoracic;  Laterality: N/A;    Social History:  reports that she quit smoking about 27 years ago. Her smoking use included cigarettes. She has a 10.00 pack-year smoking history. She has never used smokeless tobacco. She reports that she does not drink alcohol or use drugs.  Allergies: Allergies  Allergen Reactions  . Ciprofloxacin Anaphylaxis    Family History:  Family History  Problem Relation Age of Onset  . Heart disease Sister        cerebral vascular disease also  . Heart disease Brother        cerebral vascular disease also  . Heart disease Brother        cerebral vascular disease  . Heart disease Sister        cebreal vascular disease  also  . Heart disease Sister        cebreal vascular diease also  . Heart disease Sister        cerebral vascular diease also  . Stomach cancer Father   . Colon cancer Neg Hx   . Esophageal cancer Neg Hx   . Rectal cancer Neg Hx   . Colon polyps Neg Hx   . Asthma Neg Hx      Current Outpatient Medications:  .  ALPRAZolam (XANAX) 0.25 MG tablet, Take 1 tablet (0.25 mg total) by mouth 3 (three) times daily as needed., Disp: 90 tablet, Rfl: 2 .  aspirin EC 81 MG tablet, Take 81 mg by mouth daily after breakfast. , Disp: , Rfl:  .  Cholecalciferol (VITAMIN D PO), Take 1,000 Units by mouth 2 (two) times daily., Disp: , Rfl:  .  diltiazem (CARTIA XT) 120 MG 24 hr capsule, Take 1 capsule (120 mg total) by mouth daily., Disp: 90 capsule, Rfl: 1 .  docusate sodium (COLACE) 100 MG capsule, Take 100 mg by mouth daily as needed for mild constipation., Disp: ,  Rfl:  .  FULVESTRANT IM, Inject into the muscle. Takes every 28 days, Disp: , Rfl:  .  Glucosamine 500 MG CAPS, Take 500 mg by mouth daily. , Disp: , Rfl:  .  IBRANCE 75 MG capsule, TAKE 1 CAPSULE (75 MG TOTAL) BY MOUTH DAILY WITH BREAKFAST. TAKE WHOLE WITH FOOD. TAKE FOR 21 DAYS ON, 7 DAYS OFF, REPEAT EVERY 28 DAYS., Disp: 21 capsule, Rfl: 6 .  loratadine (CLARITIN) 10 MG tablet, Take 1 tablet (10 mg total) by mouth daily., Disp: 30 tablet, Rfl: 11 .  OVER THE COUNTER MEDICATION, Beno for gas, takes twice daily, Disp: , Rfl:  .  Probiotic Product (PROBIOTIC DAILY PO), Take 1 tablet by mouth daily. , Disp: , Rfl:   Current Facility-Administered Medications:  .  0.9 %  sodium chloride infusion, 500 mL, Intravenous, Once, Armbruster, Carlota Raspberry, MD  Review of Systems:  Constitutional: Denies fever, chills, diaphoresis, appetite change and fatigue.  HEENT: Denies photophobia, eye pain, redness, hearing loss, ear pain, congestion, sore throat, rhinorrhea, sneezing, mouth sores, trouble swallowing, neck pain, neck stiffness and tinnitus.   Respiratory: Denies SOB, DOE, cough, chest tightness,  and wheezing.   Cardiovascular: Denies chest pain, palpitations and leg swelling.  Gastrointestinal: Denies nausea, vomiting, abdominal pain, diarrhea, constipation, blood in stool and abdominal distention.  Genitourinary: Denies dysuria, urgency, frequency, hematuria, flank pain and difficulty urinating.  Endocrine: Denies: hot or cold intolerance, sweats, changes in hair or nails, polyuria, polydipsia. Musculoskeletal: Denies myalgias, back pain, joint swelling, arthralgias and gait problem.  Skin: Denies pallor, rash and wound.  Neurological: Denies dizziness, seizures, syncope, weakness, light-headedness, numbness and headaches.  Hematological: Denies adenopathy. Easy bruising, personal or family bleeding history  Psychiatric/Behavioral: Denies suicidal ideation, mood changes, confusion, nervousness, sleep  disturbance and agitation    Physical Exam: Vitals:   10/07/18 0826  BP: 118/80  Pulse: 80  Temp: 98.4 F (36.9 C)  TempSrc: Oral  SpO2: 96%  Weight: 129 lb 1.6 oz (58.6 kg)    Body mass index is 23.61 kg/m.   Constitutional: NAD, calm, comfortable Eyes: PERRL, lids and conjunctivae normal ENMT: Mucous membranes are moist.  Neck: normal, supple, no masses, no thyromegaly Respiratory: clear to auscultation bilaterally, no wheezing, no crackles. Normal respiratory effort. No accessory muscle use.  Cardiovascular: Regular rate and rhythm, no murmurs / rubs / gallops. No extremity edema.  2+ pedal pulses. No carotid bruits.  Musculoskeletal: no clubbing / cyanosis. No joint deformity upper and lower extremities. Good ROM, no contractures. Normal muscle tone.  Skin: no rashes, lesions, ulcers. No induration Psychiatric: Normal judgment and insight. Alert and oriented x 3. Normal mood.    Impression and Plan:  Dyslipidemia -Last LDL was 86 in July 2019. -She is not on a statin. -Recheck lipids at time of annual wellness.  Essential hypertension  -Well-controlled, refill diltiazem.  Cancer of right breast, stage 4 (HCC) -Followed by Dr. Jana Hakim  Skin pruritus -Advised to stop all new lotions, detergents, deodorants that could be causing contact dermatitis. -There is no visible rash. -May use calamine/Sarna lotion as needed.  Anxiety -Refill Xanax x3 months today. -We have discussed her concerns regarding drug tolerance, when I offered her to pursue a slow wean she states she would like to think about this.  Will readdress at next visit.      Lelon Frohlich, MD [facility]

## 2018-10-08 NOTE — Progress Notes (Signed)
[place]  [phone] [phone]     ID: Erica Young DOB: 12-Sep-1940  MR#: [id number]  BSW#:[id number]  Patient Care Team: Isaac Bliss, Rayford Halsted, MD as PCP - General (Internal Medicine) Tanda Rockers, MD as Consulting Physician (Pulmonary Disease) [place] Espina, Virgie Dad, MD as Consulting Physician (Oncology) Armbruster, Carlota Raspberry, MD as Consulting Physician (Gastroenterology) Bobbitt, [place] Muta, MD as Consulting Physician (Allergy and Immunology) OTHER MD:   CHIEF COMPLAINT: Estrogen receptor positive stage IV breast cancer   CURRENT TREATMENT: Fulvestrant; [refuses Xgeva]; started palbociclib/ Leslee Home February 2018    INTERVAL HISTORY: Erica Young returns today for follow-up and treatment of her estrogen receptor positive stage IV breast cancer.   She continues on fulvestrant. She tolerates this well. She reports itching to her hands, for which she uses creams.   The patient also continues on palbociclib, currently at 75 mg daily, 21 days on, 7 days off. She tolerates this well. She started her most recent cycle on 09/29/2018.   Lab Results  Component Value Date   CA2729 83.1 (H) 09/11/2018   CA2729 73.9 (H) 08/14/2018   CA2729 76.7 (H) 07/17/2018   CA2729 73.1 (H) 06/19/2018   CA2729 69.5 (H) 05/22/2018   Since her last visit here, she has not undergone any additional studies.    REVIEW OF SYSTEMS: Erica Young continues to experience chronic sinus problems, for which she takes Claritin. She also reports insomnia. She states she tries to take naps, but she only sleeps for maybe 5-10 minutes. She doesn't like to walk outside "because I don't like the sun." Her husband does the shopping. A detailed review of systems was otherwise entirely negative.    BREAST CANCER HISTORY: From the earlier summary note  Erica Young tells me in 2007 while living in [place] she was found to have a very small cancer in the upper-outer quadrant of the right  breast, about the size of the P, noted on mammography. It was not palpable. She had a right lumpectomy and full sentinel lymph node sampling. She then received adjuvant radiation, to a total of 33 treatments. She received no systemic therapy.  She then did well until December 2014, when she had a fall and complain of pain in her left ribs. Rib films and chest x-ray on 04/13/2013 showed no fractures, but CT of the abdomen and pelvis on the same day found subcarinal and right hilar adenopathy. This was followed up with a chest CT scan 05/05/2013 confirming extensive mediastinal and right hilar lymphadenopathy, with multiple pulmonary nodules, the largest measuring 0.9 cm. PET scan 05/21/2013 showed hypermetabolic adenopathy in the right and left paratracheal areas, the precarinal, subcarinal and right hilar areas, but no involvement of the liver, and the lung nodules were not hypermetabolic (although they were below the level of reliable detection). In addition, at L5 there was a lucency measuring 1.2 cm.  The PET scan also showed a hypermetabolic focus on the thyroid gland, which was evaluated with neck ultrasound and biopsy 46/65/9935, showing a follicular lesion of undetermined significance (([id number]).  The patient was referred to pulmonary [Dr Melvyn Novas and Dr Julien Nordmann for further evaluation. Repeat PET scan 12/28/2013 showed, in addition to the adenopathy previously noted, now multiple bony metastases. On 01/07/2014 the patient underwent bronchoscopy and this showed ([id number], together with separate cytology] a low-grade mucinous invasive breast cancer, estrogen receptor 100% positive, progesterone receptor 14% positive, with no HER-2 amplification, the signals ratio being 1.30 and the number per cell 1.95.  The patient was started on anastrozole 01/22/2014; monthly denosumab/Xgeva was added 07/30/2014. She appeared to tolerate this well, and her CA-27-29 (127 at baseline) normalized. Most recent CT  scans of chest abdomen and pelvis 10/03/2015 showed continuing response. Despite this good news however, the patient decided to go off anastrozole and denosumab/Xgeva as of June 2017. By the time she saw Dr. Earlie Server again in September 2017 her tumor marker had doubled. At that time the patient was referred to the breast clinic for a second opinion regarding further evaluation and treatment.   PAST MEDICAL HISTORY: Past Medical History:  Diagnosis Date   Allergy    Anxiety    Bone cancer ([place]) mri 11/11/14   right parietal bone,left cribriform plate metastases   Cancer ([place]) 2007   right breat ca  , lumpectomy and radiation tx (declined chemo and additional prophylactic meds)   Cataract    both eyes  10/2016 Lt.     04/2017 Rt.    CEREBROVASCULAR DISEASE 03/03/2010   Diverticulitis    DIVERTICULOSIS, COLON 03/03/2010   Fatty liver 08/02/09   as per U/S done by [facility]   Foramen ovale    positive bubble study   GERD (gastroesophageal reflux disease)    Headache(784.0)    she thinks sinus headaches   History of cerebral artery stenosis    right middle   HYPERLIPIDEMIA 03/03/2010   Hypertension    HYPOTHYROIDISM 03/03/2010   no longer on meds   Internal hemorrhoid    Low back pain    Mastoiditis    noted on brain MRI   Rib fracture    Right leg pain    Shortness of breath    Stroke [facility]) Oct. 2011   TIA   Ulcer     PAST SURGICAL HISTORY: Past Surgical History:  Procedure Laterality Date   BREAST LUMPECTOMY     right   CATARACT EXTRACTION Left    10/2016   Rt 04/2017   CHOLECYSTECTOMY     COLONOSCOPY     POLYPECTOMY     benign cecum   SHOULDER SURGERY     right shoulder (dislocation)   TONSILLECTOMY     TUBAL LIGATION     UPPER GASTROINTESTINAL ENDOSCOPY     VIDEO BRONCHOSCOPY WITH ENDOBRONCHIAL ULTRASOUND N/A 01/07/2014   Procedure: VIDEO BRONCHOSCOPY WITH ENDOBRONCHIAL ULTRASOUND;  Surgeon: Ivin Poot, MD;  Location:  [place];  Service: Thoracic;  Laterality: N/A;    FAMILY HISTORY Family History  Problem Relation Age of Onset   Heart disease Sister        cerebral vascular disease also   Heart disease Brother        cerebral vascular disease also   Heart disease Brother        cerebral vascular disease   Heart disease Sister        cebreal vascular disease also   Heart disease Sister        cebreal vascular diease also   Heart disease Sister        cerebral vascular diease also   Stomach cancer Father    Colon cancer Neg Hx    Esophageal cancer Neg Hx    Rectal cancer Neg Hx    Colon polyps Neg Hx    Asthma Neg Hx   The patient's father died from stomach cancer in his late 67s. The patient's mother died in her 53s from a stroke. The patient has 2 brothers, 4 sisters. One sister  had leukemia. There is no history of breast, colon, or ovarian cancer in the family to her knowledge   GYNECOLOGIC HISTORY:  No LMP recorded. Patient is postmenopausal. Menarche age 16, first live birth age 61, the patient is Erica Young P2. She went through menopause in her late 57s. She took no hormone replacement. She took oral contraceptives for approximately 2 years remotely without complications.   SOCIAL HISTORY: (updated 05/22/2018) Erica Young is originally from [place], [place]. She worked as a Education officer, museum, particularly, in the field of substance abuse. Her husband, Mortimer Fries, is a retired substance abuse Social worker. This is a second marriage for both of them. Kayna has 2 children from her first marriage, [name], lives in [place], and worked as a Audiological scientist but is now disabled, and Lawrence Santiago, who lives in [place] and works in Engineer, mining. Mikki Santee has 3 children from his first marriage, Tanna Furry, [place], and [place]. [name] Junior lives in [place] and has his own trucking business. Mikki Santee tells me he is estranged from his 2 daughters Amedeo Gory (a retired Pharmacist, hospital) and Salena Saner (who works in a bank). They  are living on [place].The patient has 8 grandchildren. Javana attends [employer].   ADVANCED DIRECTIVES: in place   HEALTH MAINTENANCE: Social History   Tobacco Use   Smoking status: Former Smoker    Packs/day: 0.50    Years: 20.00    Pack years: 10.00    Types: Cigarettes    Quit date: 05/01/1991    Years since quitting: 27.4   Smokeless tobacco: Never Used  Substance Use Topics   Alcohol use: No    Alcohol/week: 0.0 standard drinks   Drug use: No     Colonoscopy:  PAP:  Bone density:   Allergies  Allergen Reactions   Ciprofloxacin Anaphylaxis    Current Outpatient Medications  Medication Sig Dispense Refill   ALPRAZolam (XANAX) 0.25 MG tablet Take 1 tablet (0.25 mg total) by mouth 3 (three) times daily as needed. 90 tablet 2   aspirin EC 81 MG tablet Take 81 mg by mouth daily after breakfast.      Cholecalciferol (VITAMIN D PO) Take 1,000 Units by mouth 2 (two) times daily.     diltiazem (CARTIA XT) 120 MG 24 hr capsule Take 1 capsule (120 mg total) by mouth daily. 90 capsule 1   docusate sodium (COLACE) 100 MG capsule Take 100 mg by mouth daily as needed for mild constipation.     FULVESTRANT IM Inject into the muscle. Takes every 28 days     Glucosamine 500 MG CAPS Take 500 mg by mouth daily.      IBRANCE 75 MG capsule TAKE 1 CAPSULE (75 MG TOTAL) BY MOUTH DAILY WITH BREAKFAST. TAKE WHOLE WITH FOOD. TAKE FOR 21 DAYS ON, 7 DAYS OFF, REPEAT EVERY 28 DAYS. 21 capsule 6   loratadine (CLARITIN) 10 MG tablet Take 1 tablet (10 mg total) by mouth daily. 30 tablet 11   OVER THE COUNTER MEDICATION Beno for gas, takes twice daily     Probiotic Product (PROBIOTIC DAILY PO) Take 1 tablet by mouth daily.      Current Facility-Administered Medications  Medication Dose Route Frequency Provider Last Rate Last Dose   0.9 %  sodium chloride infusion  500 mL Intravenous Once Armbruster, Carlota Raspberry, MD        OBJECTIVE: Middle-aged Latin American  woman who appears well  Vitals:   10/09/18 0919  BP: 116/63  Pulse: 76  Temp:  98.8 F (37.1 C)  SpO2: 98%     Body mass index is 23.52 kg/m.    ECOG FS:0 - Asymptomatic  Sclerae unicteric, pupils round and equal Wearing a mask No cervical or supraclavicular adenopathy Lungs no rales or rhonchi Heart regular rate and rhythm Abd soft, nontender, positive bowel sounds MSK no focal spinal tenderness, no upper extremity lymphedema Neuro: nonfocal, well oriented, appropriate affect Breasts: Deferred  LAB RESULTS:  CMP     Component Value Date/Time   NA 137 09/11/2018 1348   NA 138 04/18/2017 0853   K 4.1 09/11/2018 1348   K 4.4 04/18/2017 0853   CL 102 09/11/2018 1348   CO2 27 09/11/2018 1348   CO2 29 04/18/2017 0853   GLUCOSE 165 (H) 09/11/2018 1348   GLUCOSE 95 04/18/2017 0853   BUN 16 09/11/2018 1348   BUN 16.6 04/18/2017 0853   CREATININE 0.92 09/11/2018 1348   CREATININE 0.8 04/18/2017 0853   CALCIUM 8.8 (L) 09/11/2018 1348   CALCIUM 9.2 04/18/2017 0853   PROT 7.1 09/11/2018 1348   PROT 7.0 04/18/2017 0853   ALBUMIN 3.7 09/11/2018 1348   ALBUMIN 3.8 04/18/2017 0853   AST 16 09/11/2018 1348   AST 17 04/18/2017 0853   ALT 14 09/11/2018 1348   ALT 16 04/18/2017 0853   ALKPHOS 89 09/11/2018 1348   ALKPHOS 90 04/18/2017 0853   BILITOT 0.3 09/11/2018 1348   BILITOT 0.63 04/18/2017 0853   GFRNONAA 60 (L) 09/11/2018 1348   GFRAA >60 09/11/2018 1348    INo results found for: SPEP, UPEP  Lab Results  Component Value Date   WBC 2.8 (L) 10/09/2018   NEUTROABS PENDING 10/09/2018   HGB 12.6 10/09/2018   HCT 37.4 10/09/2018   MCV 110.0 (H) 10/09/2018   PLT 236 10/09/2018      Chemistry      Component Value Date/Time   NA 137 09/11/2018 1348   NA 138 04/18/2017 0853   K 4.1 09/11/2018 1348   K 4.4 04/18/2017 0853   CL 102 09/11/2018 1348   CO2 27 09/11/2018 1348   CO2 29 04/18/2017 0853   BUN 16 09/11/2018 1348   BUN 16.6 04/18/2017 0853   CREATININE  0.92 09/11/2018 1348   CREATININE 0.8 04/18/2017 0853      Component Value Date/Time   CALCIUM 8.8 (L) 09/11/2018 1348   CALCIUM 9.2 04/18/2017 0853   ALKPHOS 89 09/11/2018 1348   ALKPHOS 90 04/18/2017 0853   AST 16 09/11/2018 1348   AST 17 04/18/2017 0853   ALT 14 09/11/2018 1348   ALT 16 04/18/2017 0853   BILITOT 0.3 09/11/2018 1348   BILITOT 0.63 04/18/2017 0853      No components found for: [id number]  No results for input(s): INR in the last 168 hours.  Urinalysis    Component Value Date/Time   COLORURINE STRAW (A) 11/19/2016 1750   APPEARANCEUR CLEAR 11/19/2016 1750   LABSPEC 1.003 (L) 11/19/2016 1750   PHURINE 7.0 11/19/2016 1750   GLUCOSEU NEGATIVE 11/19/2016 1750   HGBUR NEGATIVE 11/19/2016 1750   BILIRUBINUR NEGATIVE 11/19/2016 1750   BILIRUBINUR neg 02/16/2015 1318   KETONESUR NEGATIVE 11/19/2016 1750   PROTEINUR NEGATIVE 11/19/2016 1750   UROBILINOGEN 0.2 02/16/2015 1318   UROBILINOGEN 0.2 11/18/2013 1321   NITRITE NEGATIVE 11/19/2016 1750   LEUKOCYTESUR TRACE (A) 11/19/2016 1750     STUDIES: No results found.   ELIGIBLE FOR AVAILABLE RESEARCH PROTOCOL: no  ASSESSMENT: 78 y.o. [place] woman  (1) status  post right breast upper outer quadrant lumpectomy and axillary lymph node dissection in 2007 for what appears to have been a T1 N0, stage IA invasive ductal carcinoma, treated adjuvantly with radiation (33 sessions)  METASTATIC DISEASE definitively documented Sept 2015 (2) bronchoscopic biopsy 01/07/2014 showed a low-grade mucinous breast cancer, strongly estrogen receptor positive, progesterone receptor positive, HER-2 negative; staging studies confirmed extensive hypermetabolic adenopathy, multiple bone lesions, likely early lung involvement, but no liver lesions.  (a) bone scan and chest CT 07/09/2016 shows stable disease.  (b) CA-27-29 is moderately informative  (c) bone scan and CT scan of the chest 04/24/2017 shows essentially stable  disease  (3) on Arimidex between September 2015 and June 2017, with evidence of response; discontinued secondary to side effects  (a) bone density 04/10/2016 shows osteopenia with a T score of -2.1  (4) on monthly denosumab/Xgeva between 07/30/2014 and 10/04/2015, discontinued due to patient's concerns regarding osteonecrosis of the jaw  (a) discussed again October 2017, the patient adamantly refusing denosumab  (5) started fulvestrant 01/26/2016    (a) Palbociclib added at 75 mg M/W/F, beginning mid February 2018  (b) chest CT scan obtained 12/02/2017 shows small but measurable growth of lung lesions; bones are stable  (c) palbociclib dose increased to 75 mg daily 21/7 beginning 12/23/2017   PLAN Erica Young is now nearly 5 years out from definitive diagnosis of metastatic breast cancer, with very well-controlled disease.  This is very favorable.  She continues to tolerate the fulvestrant and palbociclib with minimal side effects.  The plan is to continue these agents so long as there is no evidence of disease progression.  Her CA-27-29 is minimally increased.  We will continue to follow this.  I have encouraged her to walk more.  She does not like to go out on hot days or rainy days or sunny days.  She can walk inside.  This might help her insomnia.  At this point I am not anticipating restaging studies until late this year.  She knows to call for any other issue that may develop before the next visit.  Erica Young, Virgie Dad, MD  10/09/18 9:43 AM Medical Oncology and Hematology [facility] [address] [place], [place] [address] Tel. [phone]    Fax. [phone]   I, Erica Young, am acting as scribe for Dr. Virgie Dad. Erica Young.  I, Erica Del MD, have reviewed the above documentation for accuracy and completeness, and I agree with the above.

## 2018-10-09 ENCOUNTER — Inpatient Hospital Stay (HOSPITAL_BASED_OUTPATIENT_CLINIC_OR_DEPARTMENT_OTHER): Payer: Medicare Other | Admitting: Oncology

## 2018-10-09 ENCOUNTER — Other Ambulatory Visit: Payer: Self-pay

## 2018-10-09 ENCOUNTER — Inpatient Hospital Stay: Payer: Medicare Other | Attending: Oncology

## 2018-10-09 ENCOUNTER — Inpatient Hospital Stay: Payer: Medicare Other

## 2018-10-09 VITALS — BP 116/63 | HR 76 | Temp 98.8°F | Ht 62.0 in | Wt 128.6 lb

## 2018-10-09 DIAGNOSIS — C50911 Malignant neoplasm of unspecified site of right female breast: Secondary | ICD-10-CM

## 2018-10-09 DIAGNOSIS — Z87891 Personal history of nicotine dependence: Secondary | ICD-10-CM

## 2018-10-09 DIAGNOSIS — C7800 Secondary malignant neoplasm of unspecified lung: Secondary | ICD-10-CM

## 2018-10-09 DIAGNOSIS — C7971 Secondary malignant neoplasm of right adrenal gland: Secondary | ICD-10-CM

## 2018-10-09 DIAGNOSIS — Z853 Personal history of malignant neoplasm of breast: Secondary | ICD-10-CM

## 2018-10-09 DIAGNOSIS — C50411 Malignant neoplasm of upper-outer quadrant of right female breast: Secondary | ICD-10-CM

## 2018-10-09 DIAGNOSIS — C7951 Secondary malignant neoplasm of bone: Secondary | ICD-10-CM | POA: Diagnosis not present

## 2018-10-09 DIAGNOSIS — R978 Other abnormal tumor markers: Secondary | ICD-10-CM | POA: Diagnosis not present

## 2018-10-09 DIAGNOSIS — Z79818 Long term (current) use of other agents affecting estrogen receptors and estrogen levels: Secondary | ICD-10-CM | POA: Insufficient documentation

## 2018-10-09 DIAGNOSIS — G4700 Insomnia, unspecified: Secondary | ICD-10-CM

## 2018-10-09 DIAGNOSIS — Z170 Estrogen receptor positive status [ER+]: Secondary | ICD-10-CM

## 2018-10-09 DIAGNOSIS — M8580 Other specified disorders of bone density and structure, unspecified site: Secondary | ICD-10-CM | POA: Diagnosis not present

## 2018-10-09 DIAGNOSIS — C771 Secondary and unspecified malignant neoplasm of intrathoracic lymph nodes: Secondary | ICD-10-CM

## 2018-10-09 LAB — CMP (CANCER CENTER ONLY)
ALT: 14 U/L (ref 0–44)
AST: 17 U/L (ref 15–41)
Albumin: 3.7 g/dL (ref 3.5–5.0)
Alkaline Phosphatase: 84 U/L (ref 38–126)
Anion gap: 9 (ref 5–15)
BUN: 18 mg/dL (ref 8–23)
CO2: 25 mmol/L (ref 22–32)
Calcium: 8.8 mg/dL — ABNORMAL LOW (ref 8.9–10.3)
Chloride: 106 mmol/L (ref 98–111)
Creatinine: 0.89 mg/dL (ref 0.44–1.00)
GFR, Est AFR Am: >60 mL/min (ref >60)
GFR, Estimated: >60 mL/min (ref >60)
Glucose, Bld: 96 mg/dL (ref 70–99)
Potassium: 4.1 mmol/L (ref 3.5–5.1)
Sodium: 140 mmol/L (ref 135–145)
Total Bilirubin: 0.4 mg/dL (ref 0.3–1.2)
Total Protein: 7.1 g/dL (ref 6.5–8.1)

## 2018-10-09 LAB — CBC WITH DIFFERENTIAL/PLATELET
Abs Immature Granulocytes: 0.01 10*3/uL (ref 0.00–0.07)
Basophils Absolute: 0.1 10*3/uL (ref 0.0–0.1)
Basophils Relative: 3 %
Eosinophils Absolute: 0.2 10*3/uL (ref 0.0–0.5)
Eosinophils Relative: 5 %
HCT: 37.4 % (ref 36.0–46.0)
Hemoglobin: 12.6 g/dL (ref 12.0–15.0)
Immature Granulocytes: 0 %
Lymphocytes Relative: 32 %
Lymphs Abs: 0.9 10*3/uL (ref 0.7–4.0)
MCH: 37.1 pg — ABNORMAL HIGH (ref 26.0–34.0)
MCHC: 33.7 g/dL (ref 30.0–36.0)
MCV: 110 fL — ABNORMAL HIGH (ref 80.0–100.0)
Monocytes Absolute: 0.2 10*3/uL (ref 0.1–1.0)
Monocytes Relative: 7 %
Neutro Abs: 1.5 10*3/uL — ABNORMAL LOW (ref 1.7–7.7)
Neutrophils Relative %: 53 %
Platelets: 236 10*3/uL (ref 150–400)
RBC: 3.4 MIL/uL — ABNORMAL LOW (ref 3.87–5.11)
RDW: 12.7 % (ref 11.5–15.5)
WBC: 2.8 10*3/uL — ABNORMAL LOW (ref 4.0–10.5)
nRBC: 0 % (ref 0.0–0.2)

## 2018-10-09 MED ORDER — FULVESTRANT 250 MG/5ML IM SOLN
500.0000 mg | Freq: Once | INTRAMUSCULAR | Status: AC
  Administered 2018-10-09: 500 mg via INTRAMUSCULAR

## 2018-10-09 MED ORDER — FULVESTRANT 250 MG/5ML IM SOLN
INTRAMUSCULAR | Status: AC
  Filled 2018-10-09: qty 10

## 2018-10-09 NOTE — Patient Instructions (Signed)

## 2018-10-10 LAB — CANCER ANTIGEN 27.29: CA 27.29: 85.6 U/mL — ABNORMAL HIGH (ref 0.0–38.6)

## 2018-10-13 ENCOUNTER — Ambulatory Visit: Payer: Self-pay | Admitting: *Deleted

## 2018-10-13 NOTE — Telephone Encounter (Signed)
Message from Lionel December sent at 10/13/2018 8:55 AM EDT  Summary: Mixup in medication   Patient would like to speak with a nurse or provider regarding her medication diltiazem (CARTIA XT) 120 MG 24 hr capsule. She stated see went to the pharmacy to pick up meds and they were trying to give her something different. She had this problem before which caused a major allergic reaction. Patient is very upset about this and hasnt taken her medication due to the mixup.          Pt is stating the pharmacy was trying to give her an different medication from [facility].  Before she could tell me what the other medication was she had to go back the pharmacy, because they had called to say her medication is ready. Advised her to take her old bottle with her to pick up this medication. Per her med list the CARTIA XL is the correct medication. Routing to LB at [facility] for review. No protocol.

## 2018-10-14 NOTE — Telephone Encounter (Signed)
Spoke with patient and she has the correct medication now.

## 2018-10-22 MED FILL — IBRANCE 75 MG CAPSULE: 75 | 28 days supply | Qty: 21 | Fill #4

## 2018-11-04 ENCOUNTER — Other Ambulatory Visit: Payer: Self-pay | Admitting: Internal Medicine

## 2018-11-04 DIAGNOSIS — Z170 Estrogen receptor positive status [ER+]: Secondary | ICD-10-CM

## 2018-11-04 DIAGNOSIS — C50411 Malignant neoplasm of upper-outer quadrant of right female breast: Secondary | ICD-10-CM

## 2018-11-05 ENCOUNTER — Telehealth: Payer: Self-pay | Admitting: Pharmacist

## 2018-11-05 DIAGNOSIS — C50411 Malignant neoplasm of upper-outer quadrant of right female breast: Secondary | ICD-10-CM

## 2018-11-05 MED ORDER — PALBOCICLIB 75 MG PO TABS: 75.0000 mg | ORAL_TABLET | Freq: Every day | ORAL | 6 refills | Status: DC

## 2018-11-05 NOTE — Telephone Encounter (Signed)
Oral Oncology Pharmacist Encounter  Ibrance (palbociclib) is changing formulation from capsules to tablets. New Ibrance prescription for the 75 mg tablets has been E scribed to the [facility].  Johny Drilling, PharmD, BCPS, BCOP  11/05/2018  11:16 AM [facility] [phone]

## 2018-11-06 ENCOUNTER — Inpatient Hospital Stay: Payer: Medicare Other

## 2018-11-06 ENCOUNTER — Inpatient Hospital Stay: Payer: Medicare Other | Attending: Oncology

## 2018-11-06 ENCOUNTER — Other Ambulatory Visit: Payer: Self-pay

## 2018-11-06 VITALS — BP 145/70 | HR 78 | Temp 97.9°F

## 2018-11-06 DIAGNOSIS — C50911 Malignant neoplasm of unspecified site of right female breast: Secondary | ICD-10-CM

## 2018-11-06 DIAGNOSIS — C7951 Secondary malignant neoplasm of bone: Secondary | ICD-10-CM | POA: Diagnosis not present

## 2018-11-06 DIAGNOSIS — C50411 Malignant neoplasm of upper-outer quadrant of right female breast: Secondary | ICD-10-CM | POA: Insufficient documentation

## 2018-11-06 DIAGNOSIS — C7800 Secondary malignant neoplasm of unspecified lung: Secondary | ICD-10-CM | POA: Insufficient documentation

## 2018-11-06 DIAGNOSIS — Z170 Estrogen receptor positive status [ER+]: Secondary | ICD-10-CM

## 2018-11-06 DIAGNOSIS — Z5111 Encounter for antineoplastic chemotherapy: Secondary | ICD-10-CM | POA: Diagnosis not present

## 2018-11-06 DIAGNOSIS — Z853 Personal history of malignant neoplasm of breast: Secondary | ICD-10-CM

## 2018-11-06 DIAGNOSIS — C7971 Secondary malignant neoplasm of right adrenal gland: Secondary | ICD-10-CM | POA: Insufficient documentation

## 2018-11-06 DIAGNOSIS — C771 Secondary and unspecified malignant neoplasm of intrathoracic lymph nodes: Secondary | ICD-10-CM | POA: Insufficient documentation

## 2018-11-06 LAB — CBC WITH DIFFERENTIAL/PLATELET
Abs Immature Granulocytes: 0.01 10*3/uL (ref 0.00–0.07)
Basophils Absolute: 0.1 10*3/uL (ref 0.0–0.1)
Basophils Relative: 2 %
Eosinophils Absolute: 0.2 10*3/uL (ref 0.0–0.5)
Eosinophils Relative: 5 %
HCT: 34.8 % — ABNORMAL LOW (ref 36.0–46.0)
Hemoglobin: 12.1 g/dL (ref 12.0–15.0)
Immature Granulocytes: 0 %
Lymphocytes Relative: 42 %
Lymphs Abs: 1.4 10*3/uL (ref 0.7–4.0)
MCH: 37.5 pg — ABNORMAL HIGH (ref 26.0–34.0)
MCHC: 34.8 g/dL (ref 30.0–36.0)
MCV: 107.7 fL — ABNORMAL HIGH (ref 80.0–100.0)
Monocytes Absolute: 0.2 10*3/uL (ref 0.1–1.0)
Monocytes Relative: 6 %
Neutro Abs: 1.4 10*3/uL — ABNORMAL LOW (ref 1.7–7.7)
Neutrophils Relative %: 45 %
Platelets: 246 10*3/uL (ref 150–400)
RBC: 3.23 MIL/uL — ABNORMAL LOW (ref 3.87–5.11)
RDW: 12.9 % (ref 11.5–15.5)
WBC: 3.2 10*3/uL — ABNORMAL LOW (ref 4.0–10.5)
nRBC: 0 % (ref 0.0–0.2)

## 2018-11-06 LAB — CMP (CANCER CENTER ONLY)
ALT: 15 U/L (ref 0–44)
AST: 18 U/L (ref 15–41)
Albumin: 3.7 g/dL (ref 3.5–5.0)
Alkaline Phosphatase: 84 U/L (ref 38–126)
Anion gap: 9 (ref 5–15)
BUN: 22 mg/dL (ref 8–23)
CO2: 25 mmol/L (ref 22–32)
Calcium: 8.7 mg/dL — ABNORMAL LOW (ref 8.9–10.3)
Chloride: 106 mmol/L (ref 98–111)
Creatinine: 0.84 mg/dL (ref 0.44–1.00)
GFR, Est AFR Am: >60 mL/min (ref >60)
GFR, Estimated: >60 mL/min (ref >60)
Glucose, Bld: 96 mg/dL (ref 70–99)
Potassium: 4 mmol/L (ref 3.5–5.1)
Sodium: 140 mmol/L (ref 135–145)
Total Bilirubin: 0.4 mg/dL (ref 0.3–1.2)
Total Protein: 7.1 g/dL (ref 6.5–8.1)

## 2018-11-06 MED ORDER — FULVESTRANT 250 MG/5ML IM SOLN
INTRAMUSCULAR | Status: AC
  Filled 2018-11-06: qty 10

## 2018-11-06 MED ORDER — FULVESTRANT 250 MG/5ML IM SOLN
500.0000 mg | Freq: Once | INTRAMUSCULAR | Status: AC
  Administered 2018-11-06: 500 mg via INTRAMUSCULAR

## 2018-11-06 NOTE — Patient Instructions (Signed)
Fulvestrant injection What is this medicine? FULVESTRANT (ful VES trant) blocks the effects of estrogen. It is used to treat breast cancer. This medicine may be used for other purposes; ask your health care provider or pharmacist if you have questions. COMMON BRAND NAME(S): FASLODEX What should I tell my health care provider before I take this medicine? They need to know if you have any of these conditions:  bleeding disorders  liver disease  low blood counts, like low white cell, platelet, or red cell counts  an unusual or allergic reaction to fulvestrant, other medicines, foods, dyes, or preservatives  pregnant or trying to get pregnant  breast-feeding How should I use this medicine? This medicine is for injection into a muscle. It is usually given by a health care professional in a hospital or clinic setting. Talk to your pediatrician regarding the use of this medicine in children. Special care may be needed. Overdosage: If you think you have taken too much of this medicine contact a poison control center or emergency room at once. NOTE: This medicine is only for you. Do not share this medicine with others. What if I miss a dose? It is important not to miss your dose. Call your doctor or health care professional if you are unable to keep an appointment. What may interact with this medicine?  medicines that treat or prevent blood clots like warfarin, enoxaparin, dalteparin, apixaban, dabigatran, and rivaroxaban This list may not describe all possible interactions. Give your health care provider a list of all the medicines, herbs, non-prescription drugs, or dietary supplements you use. Also tell them if you smoke, drink alcohol, or use illegal drugs. Some items may interact with your medicine. What should I watch for while using this medicine? Your condition will be monitored carefully while you are receiving this medicine. You will need important blood work done while you are taking  this medicine. Do not become pregnant while taking this medicine or for at least 1 year after stopping it. Women of child-bearing potential will need to have a negative pregnancy test before starting this medicine. Women should inform their doctor if they wish to become pregnant or think they might be pregnant. There is a potential for serious side effects to an unborn child. Men should inform their doctors if they wish to father a child. This medicine may lower sperm counts. Talk to your health care professional or pharmacist for more information. Do not breast-feed an infant while taking this medicine or for 1 year after the last dose. What side effects may I notice from receiving this medicine? Side effects that you should report to your doctor or health care professional as soon as possible:  allergic reactions like skin rash, itching or hives, swelling of the face, lips, or tongue  feeling faint or lightheaded, falls  pain, tingling, numbness, or weakness in the legs  signs and symptoms of infection like fever or chills; cough; flu-like symptoms; sore throat  vaginal bleeding Side effects that usually do not require medical attention (report to your doctor or health care professional if they continue or are bothersome):  aches, pains  constipation  diarrhea  headache  hot flashes  nausea, vomiting  pain at site where injected  stomach pain This list may not describe all possible side effects. Call your doctor for medical advice about side effects. You may report side effects to FDA at 1-800-FDA-1088. Where should I keep my medicine? This drug is given in a hospital or clinic and will   not be stored at home. NOTE: This sheet is a summary. It may not cover all possible information. If you have questions about this medicine, talk to your doctor, pharmacist, or health care provider.  2020 Elsevier/Gold Standard (2017-07-25 11:34:41)  

## 2018-11-07 LAB — CANCER ANTIGEN 27.29: CA 27.29: 82.1 U/mL — ABNORMAL HIGH (ref 0.0–38.6)

## 2018-11-18 MED FILL — IBRANCE 75 MG TABS: 75 | 28 days supply | Qty: 21 | Fill #0

## 2018-11-26 DIAGNOSIS — H11823 Conjunctivochalasis, bilateral: Secondary | ICD-10-CM | POA: Diagnosis not present

## 2018-11-27 ENCOUNTER — Other Ambulatory Visit: Payer: Self-pay

## 2018-11-27 ENCOUNTER — Ambulatory Visit (INDEPENDENT_AMBULATORY_CARE_PROVIDER_SITE_OTHER): Payer: Medicare Other | Admitting: Internal Medicine

## 2018-11-27 ENCOUNTER — Encounter: Payer: Self-pay | Admitting: Internal Medicine

## 2018-11-27 ENCOUNTER — Telehealth: Payer: Self-pay

## 2018-11-27 VITALS — BP 110/80 | HR 96 | Temp 98.6°F | Ht 61.0 in | Wt 131.6 lb

## 2018-11-27 DIAGNOSIS — I10 Essential (primary) hypertension: Secondary | ICD-10-CM | POA: Diagnosis not present

## 2018-11-27 DIAGNOSIS — C50411 Malignant neoplasm of upper-outer quadrant of right female breast: Secondary | ICD-10-CM | POA: Diagnosis not present

## 2018-11-27 DIAGNOSIS — E559 Vitamin D deficiency, unspecified: Secondary | ICD-10-CM

## 2018-11-27 DIAGNOSIS — G4700 Insomnia, unspecified: Secondary | ICD-10-CM

## 2018-11-27 DIAGNOSIS — E538 Deficiency of other specified B group vitamins: Secondary | ICD-10-CM

## 2018-11-27 DIAGNOSIS — J309 Allergic rhinitis, unspecified: Secondary | ICD-10-CM

## 2018-11-27 DIAGNOSIS — F419 Anxiety disorder, unspecified: Secondary | ICD-10-CM | POA: Diagnosis not present

## 2018-11-27 DIAGNOSIS — Z0000 Encounter for general adult medical examination without abnormal findings: Secondary | ICD-10-CM

## 2018-11-27 DIAGNOSIS — R21 Rash and other nonspecific skin eruption: Secondary | ICD-10-CM

## 2018-11-27 DIAGNOSIS — Z853 Personal history of malignant neoplasm of breast: Secondary | ICD-10-CM

## 2018-11-27 DIAGNOSIS — E785 Hyperlipidemia, unspecified: Secondary | ICD-10-CM | POA: Diagnosis not present

## 2018-11-27 LAB — CBC WITH DIFFERENTIAL/PLATELET
Basophils Absolute: 0.1 10*3/uL (ref 0.0–0.1)
Basophils Relative: 2.4 % (ref 0.0–3.0)
Eosinophils Absolute: 0.2 10*3/uL (ref 0.0–0.7)
Eosinophils Relative: 6.1 % — ABNORMAL HIGH (ref 0.0–5.0)
HCT: 38.6 % (ref 36.0–46.0)
Hemoglobin: 13.2 g/dL (ref 12.0–15.0)
Lymphocytes Relative: 36.8 % (ref 12.0–46.0)
Lymphs Abs: 1.1 10*3/uL (ref 0.7–4.0)
MCHC: 34.2 g/dL (ref 30.0–36.0)
MCV: 110.8 fl — ABNORMAL HIGH (ref 78.0–100.0)
Monocytes Absolute: 0.3 10*3/uL (ref 0.1–1.0)
Monocytes Relative: 8.5 % (ref 3.0–12.0)
Neutro Abs: 1.4 10*3/uL (ref 1.4–7.7)
Neutrophils Relative %: 46.2 % (ref 43.0–77.0)
Platelets: 176 10*3/uL (ref 150.0–400.0)
RBC: 3.49 Mil/uL — ABNORMAL LOW (ref 3.87–5.11)
RDW: 14.1 % (ref 11.5–15.5)
WBC: 3 10*3/uL — ABNORMAL LOW (ref 4.0–10.5)

## 2018-11-27 LAB — COMPREHENSIVE METABOLIC PANEL
ALT: 13 U/L (ref 0–35)
AST: 17 U/L (ref 0–37)
Albumin: 4.2 g/dL (ref 3.5–5.2)
Alkaline Phosphatase: 84 U/L (ref 39–117)
BUN: 15 mg/dL (ref 6–23)
CO2: 28 mEq/L (ref 19–32)
Calcium: 9.2 mg/dL (ref 8.4–10.5)
Chloride: 104 mEq/L (ref 96–112)
Creatinine, Ser: 0.8 mg/dL (ref 0.40–1.20)
GFR: 69.27 mL/min (ref >60.00)
Glucose, Bld: 106 mg/dL — ABNORMAL HIGH (ref 70–99)
Potassium: 4.2 mEq/L (ref 3.5–5.1)
Sodium: 141 mEq/L (ref 135–145)
Total Bilirubin: 0.5 mg/dL (ref 0.2–1.2)
Total Protein: 7 g/dL (ref 6.0–8.3)

## 2018-11-27 LAB — LIPID PANEL
Cholesterol: 208 mg/dL — ABNORMAL HIGH (ref 0–200)
HDL: 71.8 mg/dL (ref >39.00)
LDL Cholesterol: 113 mg/dL — ABNORMAL HIGH (ref 0–99)
NonHDL: 136.62
Total CHOL/HDL Ratio: 3
Triglycerides: 120 mg/dL (ref 0.0–149.0)
VLDL: 24 mg/dL (ref 0.0–40.0)

## 2018-11-27 LAB — VITAMIN B12: Vitamin B-12: 1254 pg/mL — ABNORMAL HIGH (ref 211–911)

## 2018-11-27 LAB — VITAMIN D 25 HYDROXY (VIT D DEFICIENCY, FRACTURES): VITD: 66.92 ng/mL (ref 30.00–100.00)

## 2018-11-27 MED ORDER — FLUTICASONE PROPIONATE 50 MCG/ACT NA SUSP: 2.0000 | Freq: Every day | NASAL | 6 refills | Status: DC

## 2018-11-27 NOTE — Telephone Encounter (Signed)
Ok for derm referral

## 2018-11-27 NOTE — Telephone Encounter (Signed)
Referral placed.

## 2018-11-27 NOTE — Patient Instructions (Addendum)
-Nice seeing you today!!  -Lab work today; will notify you once results are available.  -Flonase: 2 sprays each nostril daily.  -Tetanus vaccination at your pharmacy.  -Schedule follow up in 6 months.   Preventive Care 24 Years and Older, Female Preventive care refers to lifestyle choices and visits with your health care provider that can promote health and wellness. This includes:  A yearly physical exam. This is also called an annual well check.  Regular dental and eye exams.  Immunizations.  Screening for certain conditions.  Healthy lifestyle choices, such as diet and exercise. What can I expect for my preventive care visit? Physical exam Your health care provider will check:  Height and weight. These may be used to calculate body mass index (BMI), which is a measurement that tells if you are at a healthy weight.  Heart rate and blood pressure.  Your skin for abnormal spots. Counseling Your health care provider may ask you questions about:  Alcohol, tobacco, and drug use.  Emotional well-being.  Home and relationship well-being.  Sexual activity.  Eating habits.  History of falls.  Memory and ability to understand (cognition).  Work and work Statistician.  Pregnancy and menstrual history. What immunizations do I need?  Influenza (flu) vaccine  This is recommended every year. Tetanus, diphtheria, and pertussis (Tdap) vaccine  You may need a Td booster every 10 years. Varicella (chickenpox) vaccine  You may need this vaccine if you have not already been vaccinated. Zoster (shingles) vaccine  You may need this after age 81. Pneumococcal conjugate (PCV13) vaccine  One dose is recommended after age 20. Pneumococcal polysaccharide (PPSV23) vaccine  One dose is recommended after age 15. Measles, mumps, and rubella (MMR) vaccine  You may need at least one dose of MMR if you were born in 1957 or later. You may also need a second dose. Meningococcal  conjugate (MenACWY) vaccine  You may need this if you have certain conditions. Hepatitis A vaccine  You may need this if you have certain conditions or if you travel or work in places where you may be exposed to hepatitis A. Hepatitis B vaccine  You may need this if you have certain conditions or if you travel or work in places where you may be exposed to hepatitis B. Haemophilus influenzae type b (Hib) vaccine  You may need this if you have certain conditions. You may receive vaccines as individual doses or as more than one vaccine together in one shot (combination vaccines). Talk with your health care provider about the risks and benefits of combination vaccines. What tests do I need? Blood tests  Lipid and cholesterol levels. These may be checked every 5 years, or more frequently depending on your overall health.  Hepatitis C test.  Hepatitis B test. Screening  Lung cancer screening. You may have this screening every year starting at age 60 if you have a 30-pack-year history of smoking and currently smoke or have quit within the past 15 years.  Colorectal cancer screening. All adults should have this screening starting at age 53 and continuing until age 27. Your health care provider may recommend screening at age 50 if you are at increased risk. You will have tests every 1-10 years, depending on your results and the type of screening test.  Diabetes screening. This is done by checking your blood sugar (glucose) after you have not eaten for a while (fasting). You may have this done every 1-3 years.  Mammogram. This may be done every  1-2 years. Talk with your health care provider about how often you should have regular mammograms.  BRCA-related cancer screening. This may be done if you have a family history of breast, ovarian, tubal, or peritoneal cancers. Other tests  Sexually transmitted disease (STD) testing.  Bone density scan. This is done to screen for osteoporosis. You may  have this done starting at age 78. Follow these instructions at home: Eating and drinking  Eat a diet that includes fresh fruits and vegetables, whole grains, lean protein, and low-fat dairy products. Limit your intake of foods with high amounts of sugar, saturated fats, and salt.  Take vitamin and mineral supplements as recommended by your health care provider.  Do not drink alcohol if your health care provider tells you not to drink.  If you drink alcohol: ? Limit how much you have to 0-1 drink a day. ? Be aware of how much alcohol is in your drink. In the [place] one drink equals one 12 oz bottle of beer (355 mL), one 5 oz glass of wine (148 mL), or one 1 oz glass of hard liquor (44 mL). Lifestyle  Take daily care of your teeth and gums.  Stay active. Exercise for at least 30 minutes on 5 or more days each week.  Do not use any products that contain nicotine or tobacco, such as cigarettes, e-cigarettes, and chewing tobacco. If you need help quitting, ask your health care provider.  If you are sexually active, practice safe sex. Use a condom or other form of protection in order to prevent STIs (sexually transmitted infections).  Talk with your health care provider about taking a low-dose aspirin or statin. What's next?  Go to your health care provider once a year for a well check visit.  Ask your health care provider how often you should have your eyes and teeth checked.  Stay up to date on all vaccines. This information is not intended to replace advice given to you by your health care provider. Make sure you discuss any questions you have with your health care provider. Document Released: 05/13/2015 Document Revised: 04/10/2018 Document Reviewed: 04/10/2018 Elsevier Patient Education  2020 [employer].

## 2018-11-27 NOTE — Progress Notes (Addendum)
   Established Patient Office Visit     CC/Reason for Visit: Subsequent Medicare wellness visit follow-up chronic conditions and discuss some acute issues  HPI: Erica Young is a 78 y.o. female who is coming in today for the above mentioned reasons. Past Medical History is significant for: Hypertension that has been well controlled, hyperlipidemia, stage IV breast cancer followed by Dr. Magrinat.  She has some acute concerns today.  1.  She is again having severe nasal congestion despite use of anti-histamine.  She had used Flonase in the past but discontinued due to epistaxis.  She states this is causing severe quality of life issues.  2.  She has been having severe issues with dry and itchy eyes.  She saw her ophthalmologist yesterday who prescribed steroid eyedrops.  3.  She is still dealing with insomnia.  She is not interested in medication for insomnia and is wondering what else she could possibly do.  4.  She is again concerned about "being addicted to Xanax".  States that when she tries to eliminate 1 of her doses she gets very anxious and shaky.   Past Medical/Surgical History: Past Medical History:  Diagnosis Date  . Allergy   . Anxiety   . 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 Cancer (HCC) 2007   right breat ca  , lumpectomy and radiation tx (declined chemo and additional prophylactic meds)  . Cataract    both eyes  10/2016 Lt.     04/2017 Rt.   . CEREBROVASCULAR DISEASE 03/03/2010  . Diverticulitis   . DIVERTICULOSIS, COLON 03/03/2010  . Fatty liver 08/02/09   as per U/S done by [facility]  . Foramen ovale    positive bubble study  . GERD (gastroesophageal reflux disease)   . Headache(784.0)    she thinks sinus headaches  . History of cerebral artery stenosis    right middle  . HYPERLIPIDEMIA 03/03/2010  . Hypertension   . HYPOTHYROIDISM 03/03/2010   no longer on meds  . Internal hemorrhoid   . Low  back pain   . Mastoiditis    noted on brain MRI  . Rib fracture   . Right leg pain   . Shortness of breath   . Stroke (HCC) Oct. 2011   TIA  . Ulcer     Past Surgical History:  Procedure Laterality Date  . BREAST LUMPECTOMY     right  . CATARACT EXTRACTION Left    10/2016   Rt 04/2017  . CHOLECYSTECTOMY    . COLONOSCOPY    . POLYPECTOMY     benign cecum  . SHOULDER SURGERY     right shoulder (dislocation)  . TONSILLECTOMY    . TUBAL LIGATION    . UPPER GASTROINTESTINAL ENDOSCOPY    . VIDEO BRONCHOSCOPY WITH ENDOBRONCHIAL ULTRASOUND N/A 01/07/2014   Procedure: VIDEO BRONCHOSCOPY WITH ENDOBRONCHIAL ULTRASOUND;  Surgeon: Peter Van Trigt, MD;  Location: [facility] OR;  Service: Thoracic;  Laterality: N/A;    Social History:  reports that she quit smoking about 27 years ago. Her smoking use included cigarettes. She has a 10.00 pack-year smoking history. She has never used smokeless tobacco. She reports that she does not drink alcohol or use drugs.  Allergies: Allergies  Allergen Reactions  . Ciprofloxacin Anaphylaxis    Family History:  Family History  Problem Relation Age of Onset  . Heart disease Sister        cerebral vascular disease   also  . Heart disease Brother        cerebral vascular disease also  . Heart disease Brother        cerebral vascular disease  . Heart disease Sister        cebreal vascular disease also  . Heart disease Sister        cebreal vascular diease also  . Heart disease Sister        cerebral vascular diease also  . Stomach cancer Father   . Colon cancer Neg Hx   . Esophageal cancer Neg Hx   . Rectal cancer Neg Hx   . Colon polyps Neg Hx   . Asthma Neg Hx      Current Outpatient Medications:  .  ALPRAZolam (XANAX) 0.25 MG tablet, TAKE 1 TABLET(0.25 MG) BY MOUTH THREE TIMES DAILY AS NEEDED, Disp: 90 tablet, Rfl: 2 .  aspirin EC 81 MG tablet, Take 81 mg by mouth daily after breakfast. , Disp: , Rfl:  .  Cholecalciferol (VITAMIN D PO), Take  1,000 Units by mouth 2 (two) times daily., Disp: , Rfl:  .  diltiazem (CARTIA XT) 120 MG 24 hr capsule, Take 1 capsule (120 mg total) by mouth daily., Disp: 90 capsule, Rfl: 1 .  docusate sodium (COLACE) 100 MG capsule, Take 100 mg by mouth daily as needed for mild constipation., Disp: , Rfl:  .  fluorometholone (FML) 0.1 % ophthalmic suspension, , Disp: , Rfl:  .  FULVESTRANT IM, Inject into the muscle. Takes every 28 days, Disp: , Rfl:  .  Glucosamine 500 MG CAPS, Take 500 mg by mouth daily. , Disp: , Rfl:  .  loratadine (CLARITIN) 10 MG tablet, Take 1 tablet (10 mg total) by mouth daily., Disp: 30 tablet, Rfl: 11 .  OVER THE COUNTER MEDICATION, Beno for gas, takes twice daily, Disp: , Rfl:  .  palbociclib (IBRANCE) 75 MG tablet, Take 1 tablet (75 mg total) by mouth daily. Take for 21 days on, 7 days off, repeat every 28 days., Disp: 21 tablet, Rfl: 6 .  Probiotic Product (PROBIOTIC DAILY PO), Take 1 tablet by mouth daily. , Disp: , Rfl:  .  fluticasone (FLONASE) 50 MCG/ACT nasal spray, Place 2 sprays into both nostrils daily., Disp: 16 g, Rfl: 6  Current Facility-Administered Medications:  .  0.9 %  sodium chloride infusion, 500 mL, Intravenous, Once, Armbruster, Steven P, MD  Review of Systems:  Constitutional: Denies fever, chills, diaphoresis, appetite change and fatigue.  HEENT: Denies photophobia, hearing loss, ear pain, congestion, sore throat, sneezing, mouth sores, trouble swallowing, neck pain, neck stiffness and tinnitus.   Respiratory: Denies SOB, DOE, cough, chest tightness,  and wheezing.   Cardiovascular: Denies chest pain, palpitations and leg swelling.  Gastrointestinal: Denies nausea, vomiting, abdominal pain, diarrhea, constipation, blood in stool and abdominal distention.  Genitourinary: Denies dysuria, urgency, frequency, hematuria, flank pain and difficulty urinating.  Endocrine: Denies: hot or cold intolerance, sweats, changes in hair or nails, polyuria, polydipsia.  Musculoskeletal: Denies myalgias, back pain, joint swelling, arthralgias and gait problem.  Skin: Denies pallor, rash and wound.  Neurological: Denies dizziness, seizures, syncope, weakness, light-headedness, numbness and headaches.  Hematological: Denies adenopathy. Easy bruising, personal or family bleeding history  Psychiatric/Behavioral: Denies suicidal ideation, mood changes, confusion, nervousness, sleep disturbance and agitation    Physical Exam: Vitals:   11/27/18 0815  BP: 110/80  Pulse: 96  Temp: 98.6 F (37 C)  TempSrc: Oral  SpO2: 97%  Weight: 131 lb 9.6   oz (59.7 kg)  Height: 5' 1" (1.549 m)    Body mass index is 24.87 kg/m.   Constitutional: NAD, calm, comfortable Eyes: PERRL, lids and conjunctivae normal ENMT: Mucous membranes are moist.  Tympanic membrane is pearly white, no erythema or bulging. Neck: normal, supple, no masses, no thyromegaly Respiratory: clear to auscultation bilaterally, no wheezing, no crackles. Normal respiratory effort. No accessory muscle use.  Cardiovascular: Regular rate and rhythm, no murmurs / rubs / gallops. No extremity edema. 2+ pedal pulses. No carotid bruits.  Abdomen: no tenderness, no masses palpated. No hepatosplenomegaly. Bowel sounds positive.  Musculoskeletal: no clubbing / cyanosis. No joint deformity upper and lower extremities. Good ROM, no contractures. Normal muscle tone.  Skin: no rashes, lesions, ulcers. No induration Neurologic: CN 2-12 grossly intact. Sensation intact, DTR normal. Strength 5/5 in all 4.  Psychiatric: Normal judgment and insight. Alert and oriented x 3. Normal mood.   Subsequent Medicare wellness visit   1. Risk factors, based on past  M,S,F -cardiovascular disease risk factors include age, history of hypertension, history of hyperlipidemia   2.  Physical activities: She is not very physically active   3.  Depression/mood:  Stable does not appear depressed   4.  Hearing:  No issues   5.   ADL's: Independent in all ADLs   6.  Fall risk:  Low fall risk   7.  Home safety: No problems identified   8.  Height weight, and visual acuity: Height and weight per chart, visual acuity is 20/25 with the right eye, 20/32 with the left eye and 20/25 with both eyes   9.  Counseling:  Counseled on sleep hygiene techniques, advised to obtain tetanus vaccination at pharmacy, will discuss with oncologist whether she needs to continue screening mammogram.   10. Lab orders based on risk factors: Laboratory update will be reviewed   11. Referral :  None today   12. Care plan:  Follow-up 6 months   13. Cognitive assessment:  No cognitive impairment   14. Screening: Patient provided with a written and personalized 5-10 year screening schedule in the AVS.   yes   15. Provider List Update:   PCP, oncologist (Dr. Magrinat), ophthalmologist (unknown provider)  16. Advance Directives: Full code     Office Visit from 11/27/2018 in [facility]  PHQ-9 Total Score  1      Fall Risk  11/27/2018 11/20/2016 08/23/2015 11/22/2014 05/04/2014  Falls in the past year? 0 No No No No  Comment - Emmi Telephone Survey: data to providers prior to load - - -  Number falls in past yr: 0 - - - -  Injury with Fall? 0 - - - -  Comment - - - - -  Risk for fall due to : - - - - -     Impression and Plan:  Encounter for preventive health examination  -She has routine eye and dental care. -She is due for tetanus vaccine which she will obtain at her local pharmacy.  I have recommended against shingles immunization given her immune suppressed state as it is a live vaccine. -Healthy lifestyle discussed in detail today. -Screening labs to be performed today. -She had a colonoscopy in 2019 which was inadequate due to back prep, is not interested in having a repeat. -She has not had a mammogram since 2017.  She wonders what the utility of them are as she is currently undergoing treatment for  metastatic breast cancer.  She has   an upcoming appointment with Dr. Magrinat and I have advised her to bring this up with him.  Dyslipidemia  -Last LDL was 86 in July 2019, recheck today.  Essential hypertension -Well-controlled on current regimen.  History of breast cancer  -Follow-up with Dr. Magrinat.  Allergic rhinitis, unspecified seasonality, unspecified trigger  -She is already on daily antihistamines. -She states that this significantly impacts her quality of life and is causing difficulty sleeping. -She has had epistaxis with Flonase in the past, I have advised her that we try it again given the severity of symptoms that she agrees to.  Insomnia, unspecified type  -She is not interested in taking medication for it. -I have reviewed sleep hygiene with her including no caffeine after 3 PM, no fluids at least 2 hours before bedtime, use of aromatherapy particularly lavender and having a bedtime routine.  Anxiety  -We have discussed in detail her Xanax use and her concern for drug tolerance. -She is on 0.25 mg that she takes 3 times a day.  She has noticed that she will have withdrawal symptoms if she misses even 1 dose. -Have advised her that I have no issue with her continuing this dose of Xanax currently; however if she is truly interested in weaning we could try her on a weaning schedule but that she would likely have some withdrawal symptoms while this is accomplished. -She has elected to stay on Xanax for now.    Patient Instructions  -Nice seeing you today!!  -Lab work today; will notify you once results are available.  -Flonase: 2 sprays each nostril daily.  -Tetanus vaccination at your pharmacy.  -Schedule follow up in 6 months.   Preventive Care 65 Years and Older, Female Preventive care refers to lifestyle choices and visits with your health care provider that can promote health and wellness. This includes:  A yearly physical exam. This is also called an  annual well check.  Regular dental and eye exams.  Immunizations.  Screening for certain conditions.  Healthy lifestyle choices, such as diet and exercise. What can I expect for my preventive care visit? Physical exam Your health care provider will check:  Height and weight. These may be used to calculate body mass index (BMI), which is a measurement that tells if you are at a healthy weight.  Heart rate and blood pressure.  Your skin for abnormal spots. Counseling Your health care provider may ask you questions about:  Alcohol, tobacco, and drug use.  Emotional well-being.  Home and relationship well-being.  Sexual activity.  Eating habits.  History of falls.  Memory and ability to understand (cognition).  Work and work environment.  Pregnancy and menstrual history. What immunizations do I need?  Influenza (flu) vaccine  This is recommended every year. Tetanus, diphtheria, and pertussis (Tdap) vaccine  You may need a Td booster every 10 years. Varicella (chickenpox) vaccine  You may need this vaccine if you have not already been vaccinated. Zoster (shingles) vaccine  You may need this after age 60. Pneumococcal conjugate (PCV13) vaccine  One dose is recommended after age 65. Pneumococcal polysaccharide (PPSV23) vaccine  One dose is recommended after age 65. Measles, mumps, and rubella (MMR) vaccine  You may need at least one dose of MMR if you were born in 1957 or later. You may also need a second dose. Meningococcal conjugate (MenACWY) vaccine  You may need this if you have certain conditions. Hepatitis A vaccine  You may need this if you have certain conditions   or if you travel or work in places where you may be exposed to hepatitis A. Hepatitis B vaccine  You may need this if you have certain conditions or if you travel or work in places where you may be exposed to hepatitis B. Haemophilus influenzae type b (Hib) vaccine  You may need this  if you have certain conditions. You may receive vaccines as individual doses or as more than one vaccine together in one shot (combination vaccines). Talk with your health care provider about the risks and benefits of combination vaccines. What tests do I need? Blood tests  Lipid and cholesterol levels. These may be checked every 5 years, or more frequently depending on your overall health.  Hepatitis C test.  Hepatitis B test. Screening  Lung cancer screening. You may have this screening every year starting at age 62 if you have a 30-pack-year history of smoking and currently smoke or have quit within the past 15 years.  Colorectal cancer screening. All adults should have this screening starting at age 70 and continuing until age 51. Your health care provider may recommend screening at age 79 if you are at increased risk. You will have tests every 1-10 years, depending on your results and the type of screening test.  Diabetes screening. This is done by checking your blood sugar (glucose) after you have not eaten for a while (fasting). You may have this done every 1-3 years.  Mammogram. This may be done every 1-2 years. Talk with your health care provider about how often you should have regular mammograms.  BRCA-related cancer screening. This may be done if you have a family history of breast, ovarian, tubal, or peritoneal cancers. Other tests  Sexually transmitted disease (STD) testing.  Bone density scan. This is done to screen for osteoporosis. You may have this done starting at age 9. Follow these instructions at home: Eating and drinking  Eat a diet that includes fresh fruits and vegetables, whole grains, lean protein, and low-fat dairy products. Limit your intake of foods with high amounts of sugar, saturated fats, and salt.  Take vitamin and mineral supplements as recommended by your health care provider.  Do not drink alcohol if your health care provider tells you not to  drink.  If you drink alcohol: ? Limit how much you have to 0-1 drink a day. ? Be aware of how much alcohol is in your drink. In the [place] one drink equals one 12 oz bottle of beer (355 mL), one 5 oz glass of wine (148 mL), or one 1 oz glass of hard liquor (44 mL). Lifestyle  Take daily care of your teeth and gums.  Stay active. Exercise for at least 30 minutes on 5 or more days each week.  Do not use any products that contain nicotine or tobacco, such as cigarettes, e-cigarettes, and chewing tobacco. If you need help quitting, ask your health care provider.  If you are sexually active, practice safe sex. Use a condom or other form of protection in order to prevent STIs (sexually transmitted infections).  Talk with your health care provider about taking a low-dose aspirin or statin. What's next?  Go to your health care provider once a year for a well check visit.  Ask your health care provider how often you should have your eyes and teeth checked.  Stay up to date on all vaccines. This information is not intended to replace advice given to you by your health care provider. Make sure you discuss  any questions you have with your health care provider. Document Released: 05/13/2015 Document Revised: 04/10/2018 Document Reviewed: 04/10/2018 Elsevier Patient Education  2020 [employer].      Estela Hernandez Acosta, MD [facility]   

## 2018-11-27 NOTE — Telephone Encounter (Signed)
Copied from [place] [id number]. Topic: Referral - Request for Referral >> Nov 27, 2018  1:09 PM Lennox Solders wrote: Has patient seen PCP no  Reason for referral: dermatologist. Pt forgot to mention to dr  today she has rash on face and it itchy and her skin is dry. Pt has something some white stuffy coming out of her cheek. Pt has been using face cream and oil. Medicare insurance/[employer]

## 2018-12-03 NOTE — Progress Notes (Signed)
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   She continues on fulvestrant with a dose due today. She tolerates this well. She reports itching to her hands and toes, for which she uses creams.   The patient also continues on palbociclib, currently at 75 mg daily, 21 days on, 7 days off. She is in her second week. She tolerates this well. She denies any weight loss (noting weight gain because of treats).  Lab Results  Component Value Date   CA2729 82.1 (H) 11/06/2018   CA2729 85.6 (H) 10/09/2018   CA2729 83.1 (H) 09/11/2018   CA2729 73.9 (H) 08/14/2018   CA2729 76.7 (H) 07/17/2018   Since her last visit here, she has not undergone any additional studies. Most recent mammogram was on 04/10/2016 at [facility].   REVIEW OF SYSTEMS: Erica Young reports hot flashes that wake her up. These are new since we saw her last on 10/09/2018. She states her eyes are better but still tearing. She states she does not exercise, but she walks around the house. She states she hardly leaves the house. She denies any bowel issues. She notes some pain to her feet and lower back. A detailed review of systems was otherwise entirely negative.     BREAST CANCER  HISTORY: From the earlier summary note  Narissa tells me in 2007 while living in [place] she was found to have a very small cancer in the upper-outer quadrant of the right breast, about the size of the P, noted on mammography. It was not palpable. She had a right lumpectomy and full sentinel lymph node sampling. She then received adjuvant radiation, to a total of 33 treatments. She received no systemic therapy.  She then did well until December 2014, when she had a fall and complain of pain in her left ribs. Rib films and chest x-ray on 04/13/2013 showed no fractures, but CT of the abdomen and pelvis on the same day found subcarinal and right hilar adenopathy. This was followed up with a chest CT scan 05/05/2013 confirming extensive mediastinal and right hilar lymphadenopathy, with multiple pulmonary nodules, the largest measuring 0.9 cm. PET scan 05/21/2013 showed hypermetabolic adenopathy in the right and left paratracheal areas, the precarinal, subcarinal and right hilar areas, but no involvement of the liver, and the lung nodules were not hypermetabolic (although they were below the level of reliable detection). In addition, at L5 there was a lucency measuring 1.2 cm.  The PET scan also showed a hypermetabolic focus on the thyroid gland, which was evaluated with neck ultrasound and biopsy 99/77/4142, showing a follicular lesion of undetermined significance ((NZA [id number]).  The patient was referred to pulmonary [Dr Melvyn Novas and Dr Julien Nordmann for further evaluation. Repeat PET scan 12/28/2013 showed, in addition to the adenopathy previously noted, now multiple bony metastases. On 01/07/2014 the patient underwent bronchoscopy  and this showed (SZA [phone], together with separate cytology] a low-grade mucinous invasive breast cancer, estrogen receptor 100% positive, progesterone receptor 14% positive, with no HER-2 amplification, the signals ratio being 1.30 and the number per cell 1.95.  The patient was started  on anastrozole 01/22/2014; monthly denosumab/Xgeva was added 07/30/2014. She appeared to tolerate this well, and her CA-27-29 (127 at baseline) normalized. Most recent CT scans of chest abdomen and pelvis 10/03/2015 showed continuing response. Despite this good news however, the patient decided to go off anastrozole and denosumab/Xgeva as of June 2017. By the time she saw Dr. Earlie Server again in September 2017 her tumor marker had doubled. At that time the patient was referred to the breast clinic for a second opinion regarding further evaluation and treatment.   PAST MEDICAL HISTORY: Past Medical History:  Diagnosis Date  . Allergy   . Anxiety   . Bone cancer ([facility]) mri 11/11/14   right parietal bone,left cribriform plate metastases  . Cancer [facility]) 2007   right breat ca  , lumpectomy and radiation tx (declined chemo and additional prophylactic meds)  . Cataract    both eyes  10/2016 Lt.     04/2017 Rt.   . CEREBROVASCULAR DISEASE 03/03/2010  . Diverticulitis   . DIVERTICULOSIS, COLON 03/03/2010  . Fatty liver 08/02/09   as per U/S done by [facility]  . Foramen ovale    positive bubble study  . GERD (gastroesophageal reflux disease)   . Headache(784.0)    she thinks sinus headaches  . History of cerebral artery stenosis    right middle  . HYPERLIPIDEMIA 03/03/2010  . Hypertension   . HYPOTHYROIDISM 03/03/2010   no longer on meds  . Internal hemorrhoid   . Low back pain   . Mastoiditis    noted on brain MRI  . Rib fracture   . Right leg pain   . Shortness of breath   . Stroke [facility]) Oct. 2011   TIA  . Ulcer     PAST SURGICAL HISTORY: Past Surgical History:  Procedure Laterality Date  . BREAST LUMPECTOMY     right  . CATARACT EXTRACTION Left    10/2016   Rt 04/2017  . CHOLECYSTECTOMY    . COLONOSCOPY    . POLYPECTOMY     benign cecum  . SHOULDER SURGERY     right shoulder (dislocation)  . TONSILLECTOMY    . TUBAL LIGATION    . UPPER GASTROINTESTINAL ENDOSCOPY     . VIDEO BRONCHOSCOPY WITH ENDOBRONCHIAL ULTRASOUND N/A 01/07/2014   Procedure: VIDEO BRONCHOSCOPY WITH ENDOBRONCHIAL ULTRASOUND;  Surgeon: Ivin Poot, MD;  Location: [facility] OR;  Service: Thoracic;  Laterality: N/A;    FAMILY HISTORY Family History  Problem Relation Age of Onset  . Heart disease Sister        cerebral vascular disease also  . Heart disease Brother        cerebral vascular disease also  . Heart disease Brother        cerebral vascular disease  . Heart disease Sister        cebreal vascular disease also  . Heart disease Sister        cebreal vascular diease also  . Heart disease Sister        cerebral vascular diease also  . Stomach cancer Father   . Colon cancer Neg Hx   . Esophageal cancer Neg Hx   . Rectal cancer Neg Hx   . Colon polyps Neg  Hx   . Asthma Neg Hx   The patient's father died from stomach cancer in his late 57s. The patient's mother died in her 27s from a stroke. The patient has 2 brothers, 4 sisters. One sister had leukemia. There is no history of breast, colon, or ovarian cancer in the family to her knowledge   GYNECOLOGIC HISTORY:  No LMP recorded. Patient is postmenopausal. Menarche age 21, first live birth age 78, the patient is [place] P2. She went through menopause in her late 43s. She took no hormone replacement. She took oral contraceptives for approximately 2 years remotely without complications.   SOCIAL HISTORY: (updated 05/22/2018) Evolet is originally from [place], [place]. She worked as a Education officer, museum, particularly, in the field of substance abuse. Her husband, Mortimer Fries, is a retired substance abuse Social worker. This is a second marriage for both of them. Timberlynn has 2 children from her first marriage, [name], lives in [place], and worked as a Audiological scientist but is now disabled, and Lawrence Santiago, who lives in [place] and works in Engineer, mining. Mikki Santee has 3 children from his first marriage, Tanna Furry, Gregory, and [place]. [name] Junior  lives in [place] and has his own trucking business. Mikki Santee tells me he is estranged from his 2 daughters Amedeo Gory (a retired Pharmacist, hospital) and Salena Saner (who works in a bank). They are living on [place].The patient has 8 grandchildren. Aveah attends [employer].   ADVANCED DIRECTIVES: in place   HEALTH MAINTENANCE: Social History   Tobacco Use  . Smoking status: Former Smoker    Packs/day: 0.50    Years: 20.00    Pack years: 10.00    Types: Cigarettes    Quit date: 05/01/1991    Years since quitting: 27.6  . Smokeless tobacco: Never Used  Substance Use Topics  . Alcohol use: No    Alcohol/week: 0.0 standard drinks  . Drug use: No     Colonoscopy:  PAP:  Bone density:   Allergies  Allergen Reactions  . Ciprofloxacin Anaphylaxis    Current Outpatient Medications  Medication Sig Dispense Refill  . ALPRAZolam (XANAX) 0.25 MG tablet TAKE 1 TABLET(0.25 MG) BY MOUTH THREE TIMES DAILY AS NEEDED 90 tablet 2  . aspirin EC 81 MG tablet Take 81 mg by mouth daily after breakfast.     . Cholecalciferol (VITAMIN D PO) Take 1,000 Units by mouth 2 (two) times daily.    Marland Kitchen diltiazem (CARTIA XT) 120 MG 24 hr capsule Take 1 capsule (120 mg total) by mouth daily. 90 capsule 1  . docusate sodium (COLACE) 100 MG capsule Take 100 mg by mouth daily as needed for mild constipation.    . fluorometholone (FML) 0.1 % ophthalmic suspension     . fluticasone (FLONASE) 50 MCG/ACT nasal spray Place 2 sprays into both nostrils daily. 16 g 6  . FULVESTRANT IM Inject into the muscle. Takes every 28 days    . Glucosamine 500 MG CAPS Take 500 mg by mouth daily.     Marland Kitchen loratadine (CLARITIN) 10 MG tablet Take 1 tablet (10 mg total) by mouth daily. 30 tablet 11  . OVER THE COUNTER MEDICATION Beno for gas, takes twice daily    . palbociclib (IBRANCE) 75 MG tablet Take 1 tablet (75 mg total) by mouth daily. Take for 21 days on, 7 days off, repeat every 28 days. 21 tablet 6  . Probiotic Product (PROBIOTIC  DAILY PO) Take 1 tablet by mouth daily.  Current Facility-Administered Medications  Medication Dose Route Frequency Provider Last Rate Last Dose  . 0.9 %  sodium chloride infusion  500 mL Intravenous Once Armbruster, Carlota Raspberry, MD        OBJECTIVE: Middle-aged Latin American woman in no acute distress  Vitals:   12/04/18 1443  BP: (!) 142/61  Pulse: 75  Resp: 18  Temp: 98.9 F (37.2 C)  SpO2: 97%     Body mass index is 25.05 kg/m.    ECOG FS:0 - Asymptomatic  Sclerae unicteric, EOMs intact Wearing a mask No cervical or supraclavicular adenopathy Lungs no rales or rhonchi Heart regular rate and rhythm Abd soft, nontender, positive bowel sounds MSK no focal spinal tenderness, no upper extremity lymphedema Neuro: nonfocal, well oriented, appropriate affect Breasts: Deferred   LAB RESULTS:  CMP     Component Value Date/Time   NA 139 12/04/2018 1422   NA 138 04/18/2017 0853   K 4.4 12/04/2018 1422   K 4.4 04/18/2017 0853   CL 105 12/04/2018 1422   CO2 24 12/04/2018 1422   CO2 29 04/18/2017 0853   GLUCOSE 101 (H) 12/04/2018 1422   GLUCOSE 95 04/18/2017 0853   BUN 18 12/04/2018 1422   BUN 16.6 04/18/2017 0853   CREATININE 0.82 12/04/2018 1422   CREATININE 0.84 11/06/2018 1303   CREATININE 0.8 04/18/2017 0853   CALCIUM 9.0 12/04/2018 1422   CALCIUM 9.2 04/18/2017 0853   PROT 7.1 12/04/2018 1422   PROT 7.0 04/18/2017 0853   ALBUMIN 3.7 12/04/2018 1422   ALBUMIN 3.8 04/18/2017 0853   AST 17 12/04/2018 1422   AST 18 11/06/2018 1303   AST 17 04/18/2017 0853   ALT 16 12/04/2018 1422   ALT 15 11/06/2018 1303   ALT 16 04/18/2017 0853   ALKPHOS 87 12/04/2018 1422   ALKPHOS 90 04/18/2017 0853   BILITOT 0.3 12/04/2018 1422   BILITOT 0.4 11/06/2018 1303   BILITOT 0.63 04/18/2017 0853   GFRNONAA >60 12/04/2018 1422   GFRNONAA >60 11/06/2018 1303   GFRAA >60 12/04/2018 1422   GFRAA >60 11/06/2018 1303    INo results found for: SPEP, UPEP  Lab Results   Component Value Date   WBC 2.8 (L) 12/04/2018   NEUTROABS 1.2 (L) 12/04/2018   HGB 12.1 12/04/2018   HCT 35.4 (L) 12/04/2018   MCV 108.9 (H) 12/04/2018   PLT 264 12/04/2018      Chemistry      Component Value Date/Time   NA 139 12/04/2018 1422   NA 138 04/18/2017 0853   K 4.4 12/04/2018 1422   K 4.4 04/18/2017 0853   CL 105 12/04/2018 1422   CO2 24 12/04/2018 1422   CO2 29 04/18/2017 0853   BUN 18 12/04/2018 1422   BUN 16.6 04/18/2017 0853   CREATININE 0.82 12/04/2018 1422   CREATININE 0.84 11/06/2018 1303   CREATININE 0.8 04/18/2017 0853      Component Value Date/Time   CALCIUM 9.0 12/04/2018 1422   CALCIUM 9.2 04/18/2017 0853   ALKPHOS 87 12/04/2018 1422   ALKPHOS 90 04/18/2017 0853   AST 17 12/04/2018 1422   AST 18 11/06/2018 1303   AST 17 04/18/2017 0853   ALT 16 12/04/2018 1422   ALT 15 11/06/2018 1303   ALT 16 04/18/2017 0853   BILITOT 0.3 12/04/2018 1422   BILITOT 0.4 11/06/2018 1303   BILITOT 0.63 04/18/2017 0853      No components found for: [id number]  No results for input(s): INR in the last  168 hours.  Urinalysis    Component Value Date/Time   COLORURINE STRAW (A) 11/19/2016 1750   APPEARANCEUR CLEAR 11/19/2016 1750   LABSPEC 1.003 (L) 11/19/2016 1750   PHURINE 7.0 11/19/2016 1750   GLUCOSEU NEGATIVE 11/19/2016 1750   HGBUR NEGATIVE 11/19/2016 1750   BILIRUBINUR NEGATIVE 11/19/2016 1750   BILIRUBINUR neg 02/16/2015 1318   KETONESUR NEGATIVE 11/19/2016 1750   PROTEINUR NEGATIVE 11/19/2016 1750   UROBILINOGEN 0.2 02/16/2015 1318   UROBILINOGEN 0.2 11/18/2013 1321   NITRITE NEGATIVE 11/19/2016 1750   LEUKOCYTESUR TRACE (A) 11/19/2016 1750     STUDIES: No results found.   ELIGIBLE FOR AVAILABLE RESEARCH PROTOCOL: no  ASSESSMENT: 78 y.o. [place] woman  (1) status post right breast upper outer quadrant lumpectomy and axillary lymph node dissection in 2007 for what appears to have been a T1 N0, stage IA invasive ductal carcinoma,  treated adjuvantly with radiation (33 sessions)  METASTATIC DISEASE definitively documented Sept 2015 (2) bronchoscopic biopsy 01/07/2014 showed a low-grade mucinous breast cancer, strongly estrogen receptor positive, progesterone receptor positive, HER-2 negative; staging studies confirmed extensive hypermetabolic adenopathy, multiple bone lesions, likely early lung involvement, but no liver lesions.  (a) bone scan and chest CT 07/09/2016 shows stable disease.  (b) CA-27-29 is moderately informative  (c) bone scan and CT scan of the chest 04/24/2017 shows essentially stable disease  (3) on Arimidex between September 2015 and June 2017, with evidence of response; discontinued secondary to side effects  (a) bone density 04/10/2016 shows osteopenia with a T score of -2.1  (4) on monthly denosumab/Xgeva between 07/30/2014 and 10/04/2015, discontinued due to patient's concerns regarding osteonecrosis of the jaw  (a) discussed again October 2017, the patient adamantly refusing denosumab  (5) started fulvestrant 01/26/2016    (a) Palbociclib added at 75 mg M/W/F, beginning mid February 2018  (b) chest CT scan obtained 12/02/2017 shows small but measurable growth of lung lesions; bones are stable  (c) palbociclib dose increased to 75 mg daily 21/7 beginning 12/23/2017   PLAN Curtice is now just about 5 years out from definitive diagnosis of metastatic breast cancer, with very well-controlled disease.  This is favorable.  She is tolerating the palbociclib and fulvestrant well and the plan is to continue that until there is evidence of disease progression.  In that regard I am setting her up for a CT scan of the chest in late October and to see me shortly after that  She is enjoying the new place where they moved to, which is smaller, and are getting to meet some neighbors.  She is very positive and tells me she "wants to live" and has a lot to live for.  She knows to call for any other issue  that may develop before the next visit.  Savvy Peeters, Virgie Dad, MD  12/04/18 3:23 PM Medical Oncology and Hematology [facility] [address] [place], [place] [address] Tel. [phone]    Fax. [phone]   I, Wilburn Mylar, am acting as scribe for Dr. Virgie Dad. Hazen Brumett.  I, Lurline Del MD, have reviewed the above documentation for accuracy and completeness, and I agree with the above.

## 2018-12-04 ENCOUNTER — Inpatient Hospital Stay: Payer: Medicare Other

## 2018-12-04 ENCOUNTER — Inpatient Hospital Stay: Payer: Medicare Other | Attending: Oncology

## 2018-12-04 ENCOUNTER — Other Ambulatory Visit: Payer: Self-pay

## 2018-12-04 ENCOUNTER — Inpatient Hospital Stay (HOSPITAL_BASED_OUTPATIENT_CLINIC_OR_DEPARTMENT_OTHER): Payer: Medicare Other | Admitting: Oncology

## 2018-12-04 VITALS — BP 142/61 | HR 75 | Temp 98.9°F | Resp 18 | Ht 61.0 in | Wt 132.6 lb

## 2018-12-04 DIAGNOSIS — C7951 Secondary malignant neoplasm of bone: Secondary | ICD-10-CM | POA: Diagnosis not present

## 2018-12-04 DIAGNOSIS — C50911 Malignant neoplasm of unspecified site of right female breast: Secondary | ICD-10-CM | POA: Diagnosis not present

## 2018-12-04 DIAGNOSIS — Z170 Estrogen receptor positive status [ER+]: Secondary | ICD-10-CM

## 2018-12-04 DIAGNOSIS — Z5111 Encounter for antineoplastic chemotherapy: Secondary | ICD-10-CM | POA: Diagnosis not present

## 2018-12-04 DIAGNOSIS — C7800 Secondary malignant neoplasm of unspecified lung: Secondary | ICD-10-CM | POA: Insufficient documentation

## 2018-12-04 DIAGNOSIS — Z853 Personal history of malignant neoplasm of breast: Secondary | ICD-10-CM

## 2018-12-04 DIAGNOSIS — C50411 Malignant neoplasm of upper-outer quadrant of right female breast: Secondary | ICD-10-CM

## 2018-12-04 DIAGNOSIS — C771 Secondary and unspecified malignant neoplasm of intrathoracic lymph nodes: Secondary | ICD-10-CM | POA: Insufficient documentation

## 2018-12-04 DIAGNOSIS — C7971 Secondary malignant neoplasm of right adrenal gland: Secondary | ICD-10-CM | POA: Insufficient documentation

## 2018-12-04 LAB — COMPREHENSIVE METABOLIC PANEL
ALT: 16 U/L (ref 0–44)
AST: 17 U/L (ref 15–41)
Albumin: 3.7 g/dL (ref 3.5–5.0)
Alkaline Phosphatase: 87 U/L (ref 38–126)
Anion gap: 10 (ref 5–15)
BUN: 18 mg/dL (ref 8–23)
CO2: 24 mmol/L (ref 22–32)
Calcium: 9 mg/dL (ref 8.9–10.3)
Chloride: 105 mmol/L (ref 98–111)
Creatinine, Ser: 0.82 mg/dL (ref 0.44–1.00)
GFR calc Af Amer: >60 mL/min (ref >60)
GFR calc non Af Amer: >60 mL/min (ref >60)
Glucose, Bld: 101 mg/dL — ABNORMAL HIGH (ref 70–99)
Potassium: 4.4 mmol/L (ref 3.5–5.1)
Sodium: 139 mmol/L (ref 135–145)
Total Bilirubin: 0.3 mg/dL (ref 0.3–1.2)
Total Protein: 7.1 g/dL (ref 6.5–8.1)

## 2018-12-04 LAB — CBC WITH DIFFERENTIAL/PLATELET
Abs Immature Granulocytes: 0.01 10*3/uL (ref 0.00–0.07)
Basophils Absolute: 0.1 10*3/uL (ref 0.0–0.1)
Basophils Relative: 3 %
Eosinophils Absolute: 0.2 10*3/uL (ref 0.0–0.5)
Eosinophils Relative: 7 %
HCT: 35.4 % — ABNORMAL LOW (ref 36.0–46.0)
Hemoglobin: 12.1 g/dL (ref 12.0–15.0)
Immature Granulocytes: 0 %
Lymphocytes Relative: 40 %
Lymphs Abs: 1.1 10*3/uL (ref 0.7–4.0)
MCH: 37.2 pg — ABNORMAL HIGH (ref 26.0–34.0)
MCHC: 34.2 g/dL (ref 30.0–36.0)
MCV: 108.9 fL — ABNORMAL HIGH (ref 80.0–100.0)
Monocytes Absolute: 0.2 10*3/uL (ref 0.1–1.0)
Monocytes Relative: 7 %
Neutro Abs: 1.2 10*3/uL — ABNORMAL LOW (ref 1.7–7.7)
Neutrophils Relative %: 43 %
Platelets: 264 10*3/uL (ref 150–400)
RBC: 3.25 MIL/uL — ABNORMAL LOW (ref 3.87–5.11)
RDW: 12.5 % (ref 11.5–15.5)
WBC: 2.8 10*3/uL — ABNORMAL LOW (ref 4.0–10.5)
nRBC: 0 % (ref 0.0–0.2)

## 2018-12-04 MED ORDER — FULVESTRANT 250 MG/5ML IM SOLN
500.0000 mg | Freq: Once | INTRAMUSCULAR | Status: AC
  Administered 2018-12-04: 500 mg via INTRAMUSCULAR

## 2018-12-04 MED ORDER — FULVESTRANT 250 MG/5ML IM SOLN
INTRAMUSCULAR | Status: AC
  Filled 2018-12-04: qty 10

## 2018-12-04 NOTE — Patient Instructions (Signed)

## 2018-12-05 ENCOUNTER — Telehealth: Payer: Self-pay | Admitting: Oncology

## 2018-12-05 ENCOUNTER — Telehealth: Payer: Self-pay | Admitting: Internal Medicine

## 2018-12-05 NOTE — Telephone Encounter (Signed)
Left message on machine for patient to return our call 

## 2018-12-05 NOTE — Telephone Encounter (Signed)
See note

## 2018-12-05 NOTE — Telephone Encounter (Signed)
I talk with patient regarding schedule she request morning she has a ride now

## 2018-12-05 NOTE — Telephone Encounter (Signed)
Copied from [place] [phone]. Topic: General - Inquiry >> Dec 05, 2018  9:58 AM Virl Axe D wrote: Reason for CRM: Pt requesting callback for someone to go over her lab results. She does not understand them on Mychart. Please return call after 1pm.

## 2018-12-05 NOTE — Telephone Encounter (Signed)
Patient called back to speak with CMA in regards to labs. Please advise.

## 2018-12-05 NOTE — Telephone Encounter (Signed)
Pt calling back requesting a call from [place] to get her lab results. Please call pt. She is now at home.

## 2018-12-09 ENCOUNTER — Telehealth: Payer: Self-pay | Admitting: *Deleted

## 2018-12-09 DIAGNOSIS — L308 Other specified dermatitis: Secondary | ICD-10-CM | POA: Diagnosis not present

## 2018-12-09 NOTE — Telephone Encounter (Signed)
Left message on machine for patient returning her call.  Notes recorded by Isaac Bliss, Rayford Halsted, MD on 11/27/2018 at 1:51 PM EDT  Labs look good. Cholesterol is a little hight: low fat diet encouraged  CRM

## 2018-12-09 NOTE — Telephone Encounter (Signed)
Pt returned call for lab results from July 30 the. Lab message given to her with verbal understanding.

## 2018-12-17 MED FILL — IBRANCE 75 MG TABS: 75 | 28 days supply | Qty: 21 | Fill #1

## 2018-12-19 ENCOUNTER — Telehealth: Payer: Self-pay | Admitting: *Deleted

## 2018-12-19 NOTE — Telephone Encounter (Signed)
This RN returned VM from pt stating " please call I have a question about medication and can explain better when you call "  Per call- phone rang 15 X plus with no answer.  Number verified.

## 2018-12-30 ENCOUNTER — Other Ambulatory Visit: Payer: Self-pay | Admitting: Emergency Medicine

## 2018-12-30 DIAGNOSIS — R6889 Other general symptoms and signs: Secondary | ICD-10-CM | POA: Diagnosis not present

## 2018-12-30 DIAGNOSIS — Z20822 Contact with and (suspected) exposure to covid-19: Secondary | ICD-10-CM

## 2018-12-31 LAB — NOVEL CORONAVIRUS, NAA: SARS-CoV-2, NAA: NOT DETECTED

## 2019-01-01 ENCOUNTER — Inpatient Hospital Stay: Payer: Medicare Other | Attending: Oncology

## 2019-01-01 ENCOUNTER — Ambulatory Visit: Payer: Medicare Other

## 2019-01-01 ENCOUNTER — Other Ambulatory Visit: Payer: Self-pay

## 2019-01-01 ENCOUNTER — Other Ambulatory Visit: Payer: Medicare Other

## 2019-01-01 ENCOUNTER — Inpatient Hospital Stay: Payer: Medicare Other

## 2019-01-01 VITALS — BP 121/73 | HR 73 | Temp 98.7°F | Resp 18

## 2019-01-01 DIAGNOSIS — Z5111 Encounter for antineoplastic chemotherapy: Secondary | ICD-10-CM | POA: Insufficient documentation

## 2019-01-01 DIAGNOSIS — C7971 Secondary malignant neoplasm of right adrenal gland: Secondary | ICD-10-CM | POA: Diagnosis not present

## 2019-01-01 DIAGNOSIS — C7951 Secondary malignant neoplasm of bone: Secondary | ICD-10-CM | POA: Diagnosis not present

## 2019-01-01 DIAGNOSIS — C50411 Malignant neoplasm of upper-outer quadrant of right female breast: Secondary | ICD-10-CM

## 2019-01-01 DIAGNOSIS — Z853 Personal history of malignant neoplasm of breast: Secondary | ICD-10-CM

## 2019-01-01 DIAGNOSIS — C771 Secondary and unspecified malignant neoplasm of intrathoracic lymph nodes: Secondary | ICD-10-CM | POA: Diagnosis not present

## 2019-01-01 DIAGNOSIS — C7800 Secondary malignant neoplasm of unspecified lung: Secondary | ICD-10-CM | POA: Insufficient documentation

## 2019-01-01 DIAGNOSIS — Z170 Estrogen receptor positive status [ER+]: Secondary | ICD-10-CM

## 2019-01-01 DIAGNOSIS — C50911 Malignant neoplasm of unspecified site of right female breast: Secondary | ICD-10-CM

## 2019-01-01 LAB — COMPREHENSIVE METABOLIC PANEL
ALT: 23 U/L (ref 0–44)
AST: 33 U/L (ref 15–41)
Albumin: 3.7 g/dL (ref 3.5–5.0)
Alkaline Phosphatase: 80 U/L (ref 38–126)
Anion gap: 7 (ref 5–15)
BUN: 13 mg/dL (ref 8–23)
CO2: 25 mmol/L (ref 22–32)
Calcium: 8.8 mg/dL — ABNORMAL LOW (ref 8.9–10.3)
Chloride: 106 mmol/L (ref 98–111)
Creatinine, Ser: 0.76 mg/dL (ref 0.44–1.00)
GFR calc Af Amer: >60 mL/min (ref >60)
GFR calc non Af Amer: >60 mL/min (ref >60)
Glucose, Bld: 91 mg/dL (ref 70–99)
Potassium: 4.1 mmol/L (ref 3.5–5.1)
Sodium: 138 mmol/L (ref 135–145)
Total Bilirubin: 0.4 mg/dL (ref 0.3–1.2)
Total Protein: 6.8 g/dL (ref 6.5–8.1)

## 2019-01-01 LAB — CBC WITH DIFFERENTIAL/PLATELET
Abs Immature Granulocytes: 0 10*3/uL (ref 0.00–0.07)
Basophils Absolute: 0.1 10*3/uL (ref 0.0–0.1)
Basophils Relative: 2 %
Eosinophils Absolute: 0.1 10*3/uL (ref 0.0–0.5)
Eosinophils Relative: 4 %
HCT: 34.8 % — ABNORMAL LOW (ref 36.0–46.0)
Hemoglobin: 12 g/dL (ref 12.0–15.0)
Immature Granulocytes: 0 %
Lymphocytes Relative: 31 %
Lymphs Abs: 1 10*3/uL (ref 0.7–4.0)
MCH: 37.7 pg — ABNORMAL HIGH (ref 26.0–34.0)
MCHC: 34.5 g/dL (ref 30.0–36.0)
MCV: 109.4 fL — ABNORMAL HIGH (ref 80.0–100.0)
Monocytes Absolute: 0.2 10*3/uL (ref 0.1–1.0)
Monocytes Relative: 6 %
Neutro Abs: 1.8 10*3/uL (ref 1.7–7.7)
Neutrophils Relative %: 57 %
Platelets: 263 10*3/uL (ref 150–400)
RBC: 3.18 MIL/uL — ABNORMAL LOW (ref 3.87–5.11)
RDW: 12.7 % (ref 11.5–15.5)
WBC: 3.1 10*3/uL — ABNORMAL LOW (ref 4.0–10.5)
nRBC: 0 % (ref 0.0–0.2)

## 2019-01-01 MED ORDER — FULVESTRANT 250 MG/5ML IM SOLN
INTRAMUSCULAR | Status: AC
  Filled 2019-01-01: qty 10

## 2019-01-01 MED ORDER — FULVESTRANT 250 MG/5ML IM SOLN
500.0000 mg | Freq: Once | INTRAMUSCULAR | Status: AC
  Administered 2019-01-01: 10:00:00 500 mg via INTRAMUSCULAR

## 2019-01-01 NOTE — Patient Instructions (Signed)
Fulvestrant injection What is this medicine? FULVESTRANT (ful VES trant) blocks the effects of estrogen. It is used to treat breast cancer. This medicine may be used for other purposes; ask your health care provider or pharmacist if you have questions. COMMON BRAND NAME(S): FASLODEX What should I tell my health care provider before I take this medicine? They need to know if you have any of these conditions:  bleeding disorders  liver disease  low blood counts, like low white cell, platelet, or red cell counts  an unusual or allergic reaction to fulvestrant, other medicines, foods, dyes, or preservatives  pregnant or trying to get pregnant  breast-feeding How should I use this medicine? This medicine is for injection into a muscle. It is usually given by a health care professional in a hospital or clinic setting. Talk to your pediatrician regarding the use of this medicine in children. Special care may be needed. Overdosage: If you think you have taken too much of this medicine contact a poison control center or emergency room at once. NOTE: This medicine is only for you. Do not share this medicine with others. What if I miss a dose? It is important not to miss your dose. Call your doctor or health care professional if you are unable to keep an appointment. What may interact with this medicine?  medicines that treat or prevent blood clots like warfarin, enoxaparin, dalteparin, apixaban, dabigatran, and rivaroxaban This list may not describe all possible interactions. Give your health care provider a list of all the medicines, herbs, non-prescription drugs, or dietary supplements you use. Also tell them if you smoke, drink alcohol, or use illegal drugs. Some items may interact with your medicine. What should I watch for while using this medicine? Your condition will be monitored carefully while you are receiving this medicine. You will need important blood work done while you are taking  this medicine. Do not become pregnant while taking this medicine or for at least 1 year after stopping it. Women of child-bearing potential will need to have a negative pregnancy test before starting this medicine. Women should inform their doctor if they wish to become pregnant or think they might be pregnant. There is a potential for serious side effects to an unborn child. Men should inform their doctors if they wish to father a child. This medicine may lower sperm counts. Talk to your health care professional or pharmacist for more information. Do not breast-feed an infant while taking this medicine or for 1 year after the last dose. What side effects may I notice from receiving this medicine? Side effects that you should report to your doctor or health care professional as soon as possible:  allergic reactions like skin rash, itching or hives, swelling of the face, lips, or tongue  feeling faint or lightheaded, falls  pain, tingling, numbness, or weakness in the legs  signs and symptoms of infection like fever or chills; cough; flu-like symptoms; sore throat  vaginal bleeding Side effects that usually do not require medical attention (report to your doctor or health care professional if they continue or are bothersome):  aches, pains  constipation  diarrhea  headache  hot flashes  nausea, vomiting  pain at site where injected  stomach pain This list may not describe all possible side effects. Call your doctor for medical advice about side effects. You may report side effects to FDA at 1-800-FDA-1088. Where should I keep my medicine? This drug is given in a hospital or clinic and will   not be stored at home. NOTE: This sheet is a summary. It may not cover all possible information. If you have questions about this medicine, talk to your doctor, pharmacist, or health care provider.  2020 Elsevier/Gold Standard (2017-07-25 11:34:41)  

## 2019-01-02 LAB — CANCER ANTIGEN 27.29: CA 27.29: 82.8 U/mL — ABNORMAL HIGH (ref 0.0–38.6)

## 2019-01-04 ENCOUNTER — Telehealth: Payer: Self-pay | Admitting: *Deleted

## 2019-01-04 NOTE — Telephone Encounter (Signed)
Pt called to get her covid-19 test results. She was advised that she is negative for the virus. She voiced understanding. She also stated that she could not get into her MyChart, her husband is the one that helps her with that. He is in the hospital, not covid-19 related. He also tested negative.  She had not had symptoms or been around anyone that was positive.

## 2019-01-14 MED FILL — IBRANCE 75 MG TABS: 75 | 28 days supply | Qty: 21 | Fill #2

## 2019-01-15 ENCOUNTER — Ambulatory Visit (INDEPENDENT_AMBULATORY_CARE_PROVIDER_SITE_OTHER): Payer: Medicare Other

## 2019-01-15 DIAGNOSIS — Z23 Encounter for immunization: Secondary | ICD-10-CM | POA: Diagnosis not present

## 2019-01-23 ENCOUNTER — Other Ambulatory Visit: Payer: Self-pay

## 2019-01-23 ENCOUNTER — Emergency Department (HOSPITAL_COMMUNITY)
Admission: EM | Admit: 2019-01-23 | Discharge: 2019-01-23 | Disposition: A | Discharge status: Home / Self Care | Payer: Medicare Other | Attending: Emergency Medicine | Admitting: Emergency Medicine

## 2019-01-23 DIAGNOSIS — Z8673 Personal history of transient ischemic attack (TIA), and cerebral infarction without residual deficits: Secondary | ICD-10-CM | POA: Insufficient documentation

## 2019-01-23 DIAGNOSIS — K0889 Other specified disorders of teeth and supporting structures: Secondary | ICD-10-CM | POA: Diagnosis not present

## 2019-01-23 DIAGNOSIS — R220 Localized swelling, mass and lump, head: Secondary | ICD-10-CM | POA: Diagnosis not present

## 2019-01-23 DIAGNOSIS — Z853 Personal history of malignant neoplasm of breast: Secondary | ICD-10-CM | POA: Insufficient documentation

## 2019-01-23 DIAGNOSIS — Z7982 Long term (current) use of aspirin: Secondary | ICD-10-CM | POA: Diagnosis not present

## 2019-01-23 DIAGNOSIS — Z87891 Personal history of nicotine dependence: Secondary | ICD-10-CM | POA: Insufficient documentation

## 2019-01-23 DIAGNOSIS — E039 Hypothyroidism, unspecified: Secondary | ICD-10-CM | POA: Insufficient documentation

## 2019-01-23 DIAGNOSIS — Z79899 Other long term (current) drug therapy: Secondary | ICD-10-CM | POA: Insufficient documentation

## 2019-01-23 DIAGNOSIS — I10 Essential (primary) hypertension: Secondary | ICD-10-CM | POA: Diagnosis not present

## 2019-01-23 LAB — CBC WITH DIFFERENTIAL/PLATELET
Abs Immature Granulocytes: 0.02 10*3/uL (ref 0.00–0.07)
Basophils Absolute: 0.1 10*3/uL (ref 0.0–0.1)
Basophils Relative: 2 %
Eosinophils Absolute: 0.1 10*3/uL (ref 0.0–0.5)
Eosinophils Relative: 4 %
HCT: 38.3 % (ref 36.0–46.0)
Hemoglobin: 13.2 g/dL (ref 12.0–15.0)
Immature Granulocytes: 1 %
Lymphocytes Relative: 30 %
Lymphs Abs: 1 10*3/uL (ref 0.7–4.0)
MCH: 38.7 pg — ABNORMAL HIGH (ref 26.0–34.0)
MCHC: 34.5 g/dL (ref 30.0–36.0)
MCV: 112.3 fL — ABNORMAL HIGH (ref 80.0–100.0)
Monocytes Absolute: 0.2 10*3/uL (ref 0.1–1.0)
Monocytes Relative: 7 %
Neutro Abs: 2 10*3/uL (ref 1.7–7.7)
Neutrophils Relative %: 56 %
Platelets: 187 10*3/uL (ref 150–400)
RBC: 3.41 MIL/uL — ABNORMAL LOW (ref 3.87–5.11)
RDW: 13.1 % (ref 11.5–15.5)
WBC: 3.4 10*3/uL — ABNORMAL LOW (ref 4.0–10.5)
nRBC: 0 % (ref 0.0–0.2)

## 2019-01-23 LAB — COMPREHENSIVE METABOLIC PANEL
ALT: 19 U/L (ref 0–44)
AST: 22 U/L (ref 15–41)
Albumin: 4 g/dL (ref 3.5–5.0)
Alkaline Phosphatase: 82 U/L (ref 38–126)
Anion gap: 12 (ref 5–15)
BUN: 15 mg/dL (ref 8–23)
CO2: 25 mmol/L (ref 22–32)
Calcium: 9.1 mg/dL (ref 8.9–10.3)
Chloride: 103 mmol/L (ref 98–111)
Creatinine, Ser: 0.73 mg/dL (ref 0.44–1.00)
GFR calc Af Amer: >60 mL/min (ref >60)
GFR calc non Af Amer: >60 mL/min (ref >60)
Glucose, Bld: 102 mg/dL — ABNORMAL HIGH (ref 70–99)
Potassium: 4.2 mmol/L (ref 3.5–5.1)
Sodium: 140 mmol/L (ref 135–145)
Total Bilirubin: 0.4 mg/dL (ref 0.3–1.2)
Total Protein: 7.2 g/dL (ref 6.5–8.1)

## 2019-01-23 LAB — LACTIC ACID, PLASMA: Lactic Acid, Venous: 1.2 mmol/L (ref 0.5–1.9)

## 2019-01-23 MED ORDER — AMOXICILLIN 875 MG PO TABS: 875.0000 mg | ORAL_TABLET | Freq: Two times a day (BID) | ORAL | 0 refills | Status: DC

## 2019-01-23 MED ORDER — METRONIDAZOLE 500 MG PO TABS: 500.0000 mg | ORAL_TABLET | Freq: Two times a day (BID) | ORAL | 0 refills | Status: DC

## 2019-01-23 MED ORDER — SODIUM CHLORIDE 0.9% FLUSH: 3.0000 mL | Freq: Once | INTRAVENOUS | Status: DC

## 2019-01-23 NOTE — ED Provider Notes (Signed)
[place] EMERGENCY DEPARTMENT Provider Note   CSN: [id number] Arrival date & time: 01/23/19  1422     History   Chief Complaint Chief Complaint  Patient presents with  . Dental Pain  . Facial Pain    HPI Erica Young is a 78 y.o. female.     78 yo F with a chief complaint of left upper dental pain.  This been going on for the past few days.  The patient has a history of a prior infection to the same area.  Had seen her dentist and was started on antibiotics for this.  Had resolution with antibiotics and was supposed to have a root canal however the patient unfortunately had to take care of her husband who had a large stroke.  She has had trouble finding time to follow-up with the dentist.  She denies any fevers or difficulty swallowing with this.  Occurred to the same location previously.  The history is provided by the patient.  Dental Pain Location:  Upper Upper teeth location:  10/LU lateral incisor Quality:  Constant Severity:  Moderate Onset quality:  Gradual Duration:  2 days Timing:  Constant Progression:  Worsening Chronicity:  New Relieved by:  Nothing Worsened by:  Nothing Ineffective treatments:  None tried Associated symptoms: facial swelling   Associated symptoms: no congestion, no fever and no headaches     Past Medical History:  Diagnosis Date  . Allergy   . Anxiety   . Bone cancer ([place]) mri 11/11/14   right parietal bone,left cribriform plate metastases  . Cancer [facility]) 2007   right breat ca  , lumpectomy and radiation tx (declined chemo and additional prophylactic meds)  . Cataract    both eyes  10/2016 Lt.     04/2017 Rt.   . CEREBROVASCULAR DISEASE 03/03/2010  . Diverticulitis   . DIVERTICULOSIS, COLON 03/03/2010  . Fatty liver 08/02/09   as per U/S done by [facility]  . Foramen ovale    positive bubble study  . GERD (gastroesophageal reflux disease)   . Headache(784.0)    she thinks sinus headaches  . History  of cerebral artery stenosis    right middle  . HYPERLIPIDEMIA 03/03/2010  . Hypertension   . HYPOTHYROIDISM 03/03/2010   no longer on meds  . Internal hemorrhoid   . Low back pain   . Mastoiditis    noted on brain MRI  . Rib fracture   . Right leg pain   . Shortness of breath   . Stroke [facility]) Oct. 2011   TIA  . Ulcer     Patient Active Problem List   Diagnosis Date Noted  . Aortic atherosclerosis ([place]) 01/03/2018  . History of epistaxis 10/15/2017  . GI symptoms/possible food intolerance 06/18/2017  . Malignant neoplasm of upper-outer quadrant of right breast in female, estrogen receptor positive ([place]) 01/19/2016  . Bone metastases ([place]) 11/22/2014  . Perennial allergic rhinitis with a nonallergic component 02/12/2014  . Cancer of right breast, stage 4 ([place]) 01/23/2014  . History of breast cancer 01/11/2014  . Chronic rhinitis 09/17/2013  . Hilar adenopathy 05/12/2013  . Abdominal pain 05/07/2011  . Hypertension 06/09/2010  . BACK PAIN 04/10/2010  . Dyslipidemia 03/03/2010  . Cerebrovascular disease 03/03/2010  . DIVERTICULOSIS, COLON 03/03/2010    Past Surgical History:  Procedure Laterality Date  . BREAST LUMPECTOMY     right  . CATARACT EXTRACTION Left    10/2016   Rt 04/2017  . CHOLECYSTECTOMY    .  COLONOSCOPY    . POLYPECTOMY     benign cecum  . SHOULDER SURGERY     right shoulder (dislocation)  . TONSILLECTOMY    . TUBAL LIGATION    . UPPER GASTROINTESTINAL ENDOSCOPY    . VIDEO BRONCHOSCOPY WITH ENDOBRONCHIAL ULTRASOUND N/A 01/07/2014   Procedure: VIDEO BRONCHOSCOPY WITH ENDOBRONCHIAL ULTRASOUND;  Surgeon: Ivin Poot, MD;  Location: [facility] OR;  Service: Thoracic;  Laterality: N/A;     OB History    Gravida  4   Para  2   Term      Preterm      AB      Living        SAB      TAB      Ectopic      Multiple      Live Births               Home Medications    Prior to Admission medications   Medication Sig Start Date End Date  Taking? Authorizing Provider  ALPRAZolam Duanne Moron) 0.25 MG tablet TAKE 1 TABLET(0.25 MG) BY MOUTH THREE TIMES DAILY AS NEEDED 11/05/18   Isaac Bliss, Rayford Halsted, MD  amoxicillin (AMOXIL) 875 MG tablet Take 1 tablet (875 mg total) by mouth 2 (two) times daily. 01/23/19   Deno Etienne, DO  aspirin EC 81 MG tablet Take 81 mg by mouth daily after breakfast.     [provider]  Cholecalciferol (VITAMIN D PO) Take 1,000 Units by mouth 2 (two) times daily.    [provider]  diltiazem (CARTIA XT) 120 MG 24 hr capsule Take 1 capsule (120 mg total) by mouth daily. 10/07/18   Isaac Bliss, Rayford Halsted, MD  docusate sodium (COLACE) 100 MG capsule Take 100 mg by mouth daily as needed for mild constipation.    [provider]  fluorometholone (FML) 0.1 % ophthalmic suspension  11/26/18   [provider]  fluticasone (FLONASE) 50 MCG/ACT nasal spray Place 2 sprays into both nostrils daily. 11/27/18   Isaac Bliss, Rayford Halsted, MD  FULVESTRANT IM Inject into the muscle. Takes every 28 days    [provider]  Glucosamine 500 MG CAPS Take 500 mg by mouth daily.     [provider]  loratadine (CLARITIN) 10 MG tablet Take 1 tablet (10 mg total) by mouth daily. 03/18/18   Isaac Bliss, Rayford Halsted, MD  metroNIDAZOLE (FLAGYL) 500 MG tablet Take 1 tablet (500 mg total) by mouth 2 (two) times daily. 01/23/19   Deno Etienne, DO  OVER THE COUNTER MEDICATION Beno for gas, takes twice daily    [provider]  palbociclib (IBRANCE) 75 MG tablet Take 1 tablet (75 mg total) by mouth daily. Take for 21 days on, 7 days off, repeat every 28 days. 11/05/18   Magrinat, Virgie Dad, MD  Probiotic Product (PROBIOTIC DAILY PO) Take 1 tablet by mouth daily.     [provider]    Family History Family History  Problem Relation Age of Onset  . Heart disease Sister        cerebral vascular disease also  . Heart disease Brother        cerebral vascular disease also  .  Heart disease Brother        cerebral vascular disease  . Heart disease Sister        cebreal vascular disease also  . Heart disease Sister        cebreal vascular diease  also  . Heart disease Sister        cerebral vascular diease also  . Stomach cancer Father   . Colon cancer Neg Hx   . Esophageal cancer Neg Hx   . Rectal cancer Neg Hx   . Colon polyps Neg Hx   . Asthma Neg Hx     Social History Social History   Tobacco Use  . Smoking status: Former Smoker    Packs/day: 0.50    Years: 20.00    Pack years: 10.00    Types: Cigarettes    Quit date: 05/01/1991    Years since quitting: 27.7  . Smokeless tobacco: Never Used  Substance Use Topics  . Alcohol use: No    Alcohol/week: 0.0 standard drinks  . Drug use: No     Allergies   Ciprofloxacin   Review of Systems Review of Systems  Constitutional: Negative for chills and fever.  HENT: Positive for dental problem and facial swelling. Negative for congestion and rhinorrhea.   Eyes: Negative for redness and visual disturbance.  Respiratory: Negative for shortness of breath and wheezing.   Cardiovascular: Negative for chest pain and palpitations.  Gastrointestinal: Negative for nausea and vomiting.  Genitourinary: Negative for dysuria and urgency.  Musculoskeletal: Negative for arthralgias and myalgias.  Skin: Negative for pallor and wound.  Neurological: Negative for dizziness and headaches.     Physical Exam Updated Vital Signs BP (!) 147/81 (BP Location: Right Arm)   Pulse 87   Temp 98.4 F (36.9 C) (Oral)   Resp 15   Ht 5\' 2"  (1.575 m)   Wt 60 kg   SpO2 96%   BMI 24.19 kg/m   Physical Exam Vitals signs and nursing note reviewed.  Constitutional:      General: She is not in acute distress.    Appearance: She is well-developed. She is not diaphoretic.  HENT:     Head: Normocephalic and atraumatic.     Comments: Poor dentition diffusely.  All fracture to bilateral upper front teeth.  Her left lateral  incisor has some necrosis to the core.  She has some mild tenderness with percussion of the tooth.  There is no appreciable edema or erythema surrounding.  No area of fluctuance.  She has some mild increased soft tissue swelling to the left side of her face. Eyes:     Pupils: Pupils are equal, round, and reactive to light.  Neck:     Musculoskeletal: Normal range of motion and neck supple.  Cardiovascular:     Rate and Rhythm: Normal rate and regular rhythm.     Heart sounds: No murmur. No friction rub. No gallop.   Pulmonary:     Effort: Pulmonary effort is normal.     Breath sounds: No wheezing or rales.  Abdominal:     General: There is no distension.     Palpations: Abdomen is soft.     Tenderness: There is no abdominal tenderness.  Musculoskeletal:        General: No tenderness.  Skin:    General: Skin is warm and dry.  Neurological:     Mental Status: She is alert and oriented to person, place, and time.  Psychiatric:        Behavior: Behavior normal.      ED Treatments / Results  Labs (all labs ordered are listed, but only abnormal results are displayed) Labs Reviewed  COMPREHENSIVE METABOLIC PANEL - Abnormal; Notable for the following components:  Result Value   Glucose, Bld 102 (*)    All other components within normal limits  CBC WITH DIFFERENTIAL/PLATELET - Abnormal; Notable for the following components:   WBC 3.4 (*)    RBC 3.41 (*)    MCV 112.3 (*)    MCH 38.7 (*)    All other components within normal limits  LACTIC ACID, PLASMA  LACTIC ACID, PLASMA    EKG None  Radiology No results found.  Procedures Procedures (including critical care time)  Medications Ordered in ED Medications  sodium chloride flush (NS) 0.9 % injection 3 mL (has no administration in time range)     Initial Impression / Assessment and Plan / ED Course  I have reviewed the triage vital signs and the nursing notes.  Pertinent labs & imaging results that were available  during my care of the patient were reviewed by me and considered in my medical decision making (see chart for details).        78 yo F with a chief complaint of dental pain.  Patient has had an infection to the same area previously.  She has a tooth that needs a root canal however she was unable to get that procedure performed.  On exam there is no identifiable abscess.  The patient is well-appearing nontoxic no fevers.  She is undergoing chemotherapy currently for labs without significant electrolyte abnormality no renal dysfunction.  Her white blood cell count is low but she is not neutropenic.  We will start her on the antibiotics that she was on previously for this.  We will have her follow-up with her dentist.  4:31 PM:  I have discussed the diagnosis/risks/treatment options with the patient and believe the pt to be eligible for discharge home to follow-up with PCP, Dentist. We also discussed returning to the ED immediately if new or worsening sx occur. We discussed the sx which are most concerning (e.g., sudden worsening pain, fever, inability to tolerate by mouth) that necessitate immediate return. Medications administered to the patient during their visit and any new prescriptions provided to the patient are listed below.  Medications given during this visit Medications  sodium chloride flush (NS) 0.9 % injection 3 mL (has no administration in time range)     The patient appears reasonably screen and/or stabilized for discharge and I doubt any other medical condition or other [facility] requiring further screening, evaluation, or treatment in the ED at this time prior to discharge.    Final Clinical Impressions(s) / ED Diagnoses   Final diagnoses:  Pain, dental    ED Discharge Orders         Ordered    amoxicillin (AMOXIL) 875 MG tablet  2 times daily     01/23/19 1626    metroNIDAZOLE (FLAGYL) 500 MG tablet  2 times daily     01/23/19 [place], [place], [place] 01/23/19 1631

## 2019-01-23 NOTE — ED Triage Notes (Signed)
Has been on antibiotics for a tooth abscess, now has swelling in face  Pt is receiving CHEMO currently

## 2019-01-23 NOTE — Discharge Instructions (Signed)
°  Also take tylenol 1000mg (2 extra strength) four times a day.  If you doctor said it is okay for you to take ibuprofen or naproxen you can also take this for your dental pain.  Please take the antibiotics as prescribed.  He need to follow-up with your dentist.  Return for worsening pain worsening swelling or fever.

## 2019-01-28 ENCOUNTER — Other Ambulatory Visit: Payer: Self-pay | Admitting: *Deleted

## 2019-01-28 DIAGNOSIS — Z853 Personal history of malignant neoplasm of breast: Secondary | ICD-10-CM

## 2019-01-29 ENCOUNTER — Emergency Department (HOSPITAL_COMMUNITY): Payer: Medicare Other

## 2019-01-29 ENCOUNTER — Observation Stay (HOSPITAL_COMMUNITY): Payer: Medicare Other

## 2019-01-29 ENCOUNTER — Other Ambulatory Visit: Payer: Self-pay

## 2019-01-29 ENCOUNTER — Telehealth: Payer: Self-pay | Admitting: Oncology

## 2019-01-29 ENCOUNTER — Inpatient Hospital Stay: Payer: No Typology Code available for payment source | Attending: Oncology

## 2019-01-29 ENCOUNTER — Inpatient Hospital Stay: Payer: No Typology Code available for payment source

## 2019-01-29 ENCOUNTER — Inpatient Hospital Stay (HOSPITAL_BASED_OUTPATIENT_CLINIC_OR_DEPARTMENT_OTHER): Payer: No Typology Code available for payment source | Admitting: Medical

## 2019-01-29 ENCOUNTER — Ambulatory Visit: Payer: Medicare Other

## 2019-01-29 ENCOUNTER — Encounter (HOSPITAL_COMMUNITY): Payer: Self-pay

## 2019-01-29 ENCOUNTER — Other Ambulatory Visit: Payer: Medicare Other

## 2019-01-29 ENCOUNTER — Inpatient Hospital Stay (HOSPITAL_COMMUNITY)
Admission: EM | Admit: 2019-01-29 | Discharge: 2019-02-04 | DRG: 597 | Disposition: A | Discharge status: Skilled Nursing Facility | Payer: Medicare Other | Source: Ambulatory Visit | Attending: Family Medicine | Admitting: Family Medicine

## 2019-01-29 VITALS — BP 146/99 | HR 75 | Temp 97.6°F | Resp 18

## 2019-01-29 VITALS — BP 166/89 | HR 88 | Temp 98.5°F | Resp 18

## 2019-01-29 DIAGNOSIS — T451X5A Adverse effect of antineoplastic and immunosuppressive drugs, initial encounter: Secondary | ICD-10-CM

## 2019-01-29 DIAGNOSIS — I5041 Acute combined systolic (congestive) and diastolic (congestive) heart failure: Secondary | ICD-10-CM | POA: Diagnosis present

## 2019-01-29 DIAGNOSIS — Z8673 Personal history of transient ischemic attack (TIA), and cerebral infarction without residual deficits: Secondary | ICD-10-CM

## 2019-01-29 DIAGNOSIS — R627 Adult failure to thrive: Secondary | ICD-10-CM | POA: Diagnosis present

## 2019-01-29 DIAGNOSIS — Z66 Do not resuscitate: Secondary | ICD-10-CM | POA: Diagnosis present

## 2019-01-29 DIAGNOSIS — R000 Tachycardia, unspecified: Secondary | ICD-10-CM | POA: Diagnosis not present

## 2019-01-29 DIAGNOSIS — Z853 Personal history of malignant neoplasm of breast: Secondary | ICD-10-CM

## 2019-01-29 DIAGNOSIS — Z79899 Other long term (current) drug therapy: Secondary | ICD-10-CM

## 2019-01-29 DIAGNOSIS — C7951 Secondary malignant neoplasm of bone: Secondary | ICD-10-CM | POA: Insufficient documentation

## 2019-01-29 DIAGNOSIS — Z20828 Contact with and (suspected) exposure to other viral communicable diseases: Secondary | ICD-10-CM | POA: Diagnosis not present

## 2019-01-29 DIAGNOSIS — C787 Secondary malignant neoplasm of liver and intrahepatic bile duct: Secondary | ICD-10-CM | POA: Diagnosis present

## 2019-01-29 DIAGNOSIS — Z87891 Personal history of nicotine dependence: Secondary | ICD-10-CM

## 2019-01-29 DIAGNOSIS — C7971 Secondary malignant neoplasm of right adrenal gland: Secondary | ICD-10-CM | POA: Diagnosis present

## 2019-01-29 DIAGNOSIS — R531 Weakness: Secondary | ICD-10-CM

## 2019-01-29 DIAGNOSIS — C7931 Secondary malignant neoplasm of brain: Secondary | ICD-10-CM | POA: Diagnosis present

## 2019-01-29 DIAGNOSIS — Z8249 Family history of ischemic heart disease and other diseases of the circulatory system: Secondary | ICD-10-CM

## 2019-01-29 DIAGNOSIS — C50411 Malignant neoplasm of upper-outer quadrant of right female breast: Secondary | ICD-10-CM

## 2019-01-29 DIAGNOSIS — I671 Cerebral aneurysm, nonruptured: Secondary | ICD-10-CM | POA: Diagnosis not present

## 2019-01-29 DIAGNOSIS — F419 Anxiety disorder, unspecified: Secondary | ICD-10-CM

## 2019-01-29 DIAGNOSIS — R0602 Shortness of breath: Secondary | ICD-10-CM

## 2019-01-29 DIAGNOSIS — C50919 Malignant neoplasm of unspecified site of unspecified female breast: Secondary | ICD-10-CM | POA: Diagnosis present

## 2019-01-29 DIAGNOSIS — C50911 Malignant neoplasm of unspecified site of right female breast: Secondary | ICD-10-CM | POA: Diagnosis not present

## 2019-01-29 DIAGNOSIS — I5021 Acute systolic (congestive) heart failure: Secondary | ICD-10-CM

## 2019-01-29 DIAGNOSIS — I110 Hypertensive heart disease with heart failure: Secondary | ICD-10-CM | POA: Diagnosis present

## 2019-01-29 DIAGNOSIS — Z881 Allergy status to other antibiotic agents status: Secondary | ICD-10-CM

## 2019-01-29 DIAGNOSIS — R471 Dysarthria and anarthria: Secondary | ICD-10-CM | POA: Diagnosis present

## 2019-01-29 DIAGNOSIS — D701 Agranulocytosis secondary to cancer chemotherapy: Secondary | ICD-10-CM

## 2019-01-29 DIAGNOSIS — R519 Headache, unspecified: Secondary | ICD-10-CM

## 2019-01-29 DIAGNOSIS — E785 Hyperlipidemia, unspecified: Secondary | ICD-10-CM | POA: Diagnosis present

## 2019-01-29 DIAGNOSIS — I429 Cardiomyopathy, unspecified: Secondary | ICD-10-CM | POA: Diagnosis present

## 2019-01-29 DIAGNOSIS — Z87892 Personal history of anaphylaxis: Secondary | ICD-10-CM

## 2019-01-29 DIAGNOSIS — R29810 Facial weakness: Secondary | ICD-10-CM

## 2019-01-29 DIAGNOSIS — Z170 Estrogen receptor positive status [ER+]: Secondary | ICD-10-CM

## 2019-01-29 DIAGNOSIS — E43 Unspecified severe protein-calorie malnutrition: Secondary | ICD-10-CM | POA: Diagnosis not present

## 2019-01-29 DIAGNOSIS — C801 Malignant (primary) neoplasm, unspecified: Secondary | ICD-10-CM | POA: Diagnosis not present

## 2019-01-29 DIAGNOSIS — K047 Periapical abscess without sinus: Secondary | ICD-10-CM | POA: Diagnosis present

## 2019-01-29 DIAGNOSIS — Z6824 Body mass index (BMI) 24.0-24.9, adult: Secondary | ICD-10-CM

## 2019-01-29 DIAGNOSIS — Z7982 Long term (current) use of aspirin: Secondary | ICD-10-CM

## 2019-01-29 DIAGNOSIS — R4781 Slurred speech: Secondary | ICD-10-CM | POA: Diagnosis not present

## 2019-01-29 DIAGNOSIS — R29818 Other symptoms and signs involving the nervous system: Secondary | ICD-10-CM | POA: Diagnosis not present

## 2019-01-29 DIAGNOSIS — R55 Syncope and collapse: Secondary | ICD-10-CM

## 2019-01-29 DIAGNOSIS — K219 Gastro-esophageal reflux disease without esophagitis: Secondary | ICD-10-CM | POA: Diagnosis present

## 2019-01-29 DIAGNOSIS — J349 Unspecified disorder of nose and nasal sinuses: Secondary | ICD-10-CM | POA: Diagnosis present

## 2019-01-29 LAB — CBC
HCT: 37.8 % (ref 36.0–46.0)
Hemoglobin: 12.6 g/dL (ref 12.0–15.0)
MCH: 37.5 pg — ABNORMAL HIGH (ref 26.0–34.0)
MCHC: 33.3 g/dL (ref 30.0–36.0)
MCV: 112.5 fL — ABNORMAL HIGH (ref 80.0–100.0)
Platelets: 296 10*3/uL (ref 150–400)
RBC: 3.36 MIL/uL — ABNORMAL LOW (ref 3.87–5.11)
RDW: 12.7 % (ref 11.5–15.5)
WBC: 2.3 10*3/uL — ABNORMAL LOW (ref 4.0–10.5)
nRBC: 0 % (ref 0.0–0.2)

## 2019-01-29 LAB — CBC WITH DIFFERENTIAL (CANCER CENTER ONLY)
Abs Immature Granulocytes: 0.01 10*3/uL (ref 0.00–0.07)
Basophils Absolute: 0.1 10*3/uL (ref 0.0–0.1)
Basophils Relative: 3 %
Eosinophils Absolute: 0.1 10*3/uL (ref 0.0–0.5)
Eosinophils Relative: 4 %
HCT: 36 % (ref 36.0–46.0)
Hemoglobin: 12.2 g/dL (ref 12.0–15.0)
Immature Granulocytes: 0 %
Lymphocytes Relative: 29 %
Lymphs Abs: 0.7 10*3/uL (ref 0.7–4.0)
MCH: 38.1 pg — ABNORMAL HIGH (ref 26.0–34.0)
MCHC: 33.9 g/dL (ref 30.0–36.0)
MCV: 112.5 fL — ABNORMAL HIGH (ref 80.0–100.0)
Monocytes Absolute: 0.2 10*3/uL (ref 0.1–1.0)
Monocytes Relative: 8 %
Neutro Abs: 1.3 10*3/uL — ABNORMAL LOW (ref 1.7–7.7)
Neutrophils Relative %: 56 %
Platelet Count: 294 10*3/uL (ref 150–400)
RBC: 3.2 MIL/uL — ABNORMAL LOW (ref 3.87–5.11)
RDW: 12.4 % (ref 11.5–15.5)
WBC Count: 2.3 10*3/uL — ABNORMAL LOW (ref 4.0–10.5)
nRBC: 0 % (ref 0.0–0.2)

## 2019-01-29 LAB — CMP (CANCER CENTER ONLY)
ALT: 13 U/L (ref 0–44)
AST: 16 U/L (ref 15–41)
Albumin: 3.7 g/dL (ref 3.5–5.0)
Alkaline Phosphatase: 82 U/L (ref 38–126)
Anion gap: 7 (ref 5–15)
BUN: 20 mg/dL (ref 8–23)
CO2: 28 mmol/L (ref 22–32)
Calcium: 9.1 mg/dL (ref 8.9–10.3)
Chloride: 104 mmol/L (ref 98–111)
Creatinine: 0.77 mg/dL (ref 0.44–1.00)
GFR, Est AFR Am: >60 mL/min (ref >60)
GFR, Estimated: >60 mL/min (ref >60)
Glucose, Bld: 102 mg/dL — ABNORMAL HIGH (ref 70–99)
Potassium: 4.6 mmol/L (ref 3.5–5.1)
Sodium: 139 mmol/L (ref 135–145)
Total Bilirubin: 0.3 mg/dL (ref 0.3–1.2)
Total Protein: 7.2 g/dL (ref 6.5–8.1)

## 2019-01-29 LAB — COMPREHENSIVE METABOLIC PANEL
ALT: 16 U/L (ref 0–44)
AST: 28 U/L (ref 15–41)
Albumin: 4 g/dL (ref 3.5–5.0)
Alkaline Phosphatase: 73 U/L (ref 38–126)
Anion gap: 8 (ref 5–15)
BUN: 20 mg/dL (ref 8–23)
CO2: 24 mmol/L (ref 22–32)
Calcium: 8.8 mg/dL — ABNORMAL LOW (ref 8.9–10.3)
Chloride: 104 mmol/L (ref 98–111)
Creatinine, Ser: 0.72 mg/dL (ref 0.44–1.00)
GFR calc Af Amer: >60 mL/min (ref >60)
GFR calc non Af Amer: >60 mL/min (ref >60)
Glucose, Bld: 97 mg/dL (ref 70–99)
Potassium: 4.6 mmol/L (ref 3.5–5.1)
Sodium: 136 mmol/L (ref 135–145)
Total Bilirubin: 0.6 mg/dL (ref 0.3–1.2)
Total Protein: 7.1 g/dL (ref 6.5–8.1)

## 2019-01-29 LAB — DIFFERENTIAL
Abs Immature Granulocytes: 0.02 10*3/uL (ref 0.00–0.07)
Basophils Absolute: 0 10*3/uL (ref 0.0–0.1)
Basophils Relative: 1 %
Eosinophils Absolute: 0 10*3/uL (ref 0.0–0.5)
Eosinophils Relative: 2 %
Immature Granulocytes: 1 %
Lymphocytes Relative: 32 %
Lymphs Abs: 0.8 10*3/uL (ref 0.7–4.0)
Monocytes Absolute: 0.2 10*3/uL (ref 0.1–1.0)
Monocytes Relative: 9 %
Neutro Abs: 1.3 10*3/uL — ABNORMAL LOW (ref 1.7–7.7)
Neutrophils Relative %: 55 %

## 2019-01-29 LAB — APTT: aPTT: 28 seconds (ref 24–36)

## 2019-01-29 LAB — I-STAT CHEM 8, ED
BUN: 19 mg/dL (ref 8–23)
Calcium, Ion: 1.15 mmol/L (ref 1.15–1.40)
Chloride: 106 mmol/L (ref 98–111)
Creatinine, Ser: 0.6 mg/dL (ref 0.44–1.00)
Glucose, Bld: 90 mg/dL (ref 70–99)
HCT: 35 % — ABNORMAL LOW (ref 36.0–46.0)
Hemoglobin: 11.9 g/dL — ABNORMAL LOW (ref 12.0–15.0)
Potassium: 4.1 mmol/L (ref 3.5–5.1)
Sodium: 138 mmol/L (ref 135–145)
TCO2: 23 mmol/L (ref 22–32)

## 2019-01-29 LAB — SARS CORONAVIRUS 2 (TAT 6-24 HRS): SARS Coronavirus 2: NEGATIVE

## 2019-01-29 LAB — CBG MONITORING, ED: Glucose-Capillary: 96 mg/dL (ref 70–99)

## 2019-01-29 LAB — PROTIME-INR
INR: 1.1 (ref 0.8–1.2)
Prothrombin Time: 13.6 seconds (ref 11.4–15.2)

## 2019-01-29 MED ORDER — STROKE: EARLY STAGES OF RECOVERY BOOK
Freq: Once | Status: AC
  Administered 2019-01-30: 12:00:00
  Filled 2019-01-29: qty 1

## 2019-01-29 MED ORDER — SODIUM CHLORIDE (PF) 0.9 % IJ SOLN
INTRAMUSCULAR | Status: AC
  Administered 2019-01-29: 15:00:00
  Filled 2019-01-29: qty 50

## 2019-01-29 MED ORDER — ACETAMINOPHEN 325 MG PO TABS: 650.0000 mg | ORAL_TABLET | ORAL | Status: DC | PRN

## 2019-01-29 MED ORDER — FULVESTRANT 250 MG/5ML IM SOLN
500.0000 mg | Freq: Once | INTRAMUSCULAR | Status: AC
  Administered 2019-01-29: 500 mg via INTRAMUSCULAR

## 2019-01-29 MED ORDER — ACETAMINOPHEN 650 MG RE SUPP: 650.0000 mg | RECTAL | Status: DC | PRN

## 2019-01-29 MED ORDER — AMOXICILLIN 875 MG PO TABS: 875.0000 mg | ORAL_TABLET | Freq: Two times a day (BID) | ORAL | Status: DC

## 2019-01-29 MED ORDER — ALPRAZOLAM 0.25 MG PO TABS
0.2500 mg | ORAL_TABLET | Freq: Once | ORAL | Status: DC
  Filled 2019-01-29: qty 1

## 2019-01-29 MED ORDER — IOHEXOL 350 MG/ML SOLN
100.0000 mL | Freq: Once | INTRAVENOUS | Status: AC | PRN
  Administered 2019-01-29: 100 mL via INTRAVENOUS

## 2019-01-29 MED ORDER — METRONIDAZOLE 500 MG PO TABS
500.0000 mg | ORAL_TABLET | Freq: Two times a day (BID) | ORAL | Status: DC
  Administered 2019-01-29 – 2019-02-01 (×6): 500 mg via ORAL
  Filled 2019-01-29 (×6): qty 1

## 2019-01-29 MED ORDER — ASPIRIN 300 MG RE SUPP: 300.0000 mg | Freq: Every day | RECTAL | Status: DC

## 2019-01-29 MED ORDER — SODIUM CHLORIDE 0.9% FLUSH: 3.0000 mL | Freq: Once | INTRAVENOUS | Status: DC

## 2019-01-29 MED ORDER — ACETAMINOPHEN 160 MG/5ML PO SOLN: 650.0000 mg | ORAL | Status: DC | PRN

## 2019-01-29 MED ORDER — ASPIRIN 325 MG PO TABS
325.0000 mg | ORAL_TABLET | Freq: Every day | ORAL | Status: DC
  Administered 2019-01-30 – 2019-02-04 (×6): 325 mg via ORAL
  Filled 2019-01-29 (×7): qty 1

## 2019-01-29 MED ORDER — AMOXICILLIN 500 MG PO CAPS
500.0000 mg | ORAL_CAPSULE | Freq: Three times a day (TID) | ORAL | Status: DC
  Administered 2019-01-29 – 2019-02-01 (×7): 500 mg via ORAL
  Filled 2019-01-29 (×8): qty 1

## 2019-01-29 MED ORDER — SODIUM CHLORIDE 0.9 % IV SOLN
Freq: Once | INTRAVENOUS | Status: AC
  Administered 2019-01-29: 10:00:00 via INTRAVENOUS
  Filled 2019-01-29: qty 250

## 2019-01-29 MED ORDER — LORATADINE 10 MG PO TABS
10.0000 mg | ORAL_TABLET | Freq: Every day | ORAL | Status: DC
  Administered 2019-01-30 – 2019-02-04 (×6): 10 mg via ORAL
  Filled 2019-01-29 (×7): qty 1

## 2019-01-29 MED ORDER — LORAZEPAM 2 MG/ML IJ SOLN
0.5000 mg | Freq: Once | INTRAMUSCULAR | Status: AC
  Administered 2019-01-29: 14:00:00 0.5 mg via INTRAVENOUS
  Filled 2019-01-29: qty 1

## 2019-01-29 MED ORDER — FULVESTRANT 250 MG/5ML IM SOLN
INTRAMUSCULAR | Status: AC
  Filled 2019-01-29: qty 10

## 2019-01-29 MED ORDER — ONDANSETRON 4 MG PO TBDP
4.0000 mg | ORAL_TABLET | Freq: Once | ORAL | Status: DC
  Filled 2019-01-29: qty 1

## 2019-01-29 MED ORDER — ALPRAZOLAM 0.25 MG PO TABS
0.2500 mg | ORAL_TABLET | Freq: Three times a day (TID) | ORAL | Status: DC | PRN
  Administered 2019-01-30 – 2019-02-01 (×6): 0.25 mg via ORAL
  Filled 2019-01-29 (×6): qty 1

## 2019-01-29 NOTE — Telephone Encounter (Signed)
GM PAL 10/29 moved f/u from [facility] to [facility] Confirmed with relative. Schedule mailed.

## 2019-01-29 NOTE — ED Notes (Signed)
Patient transported to MRI 

## 2019-01-29 NOTE — ED Triage Notes (Addendum)
Pt at [facility] center with sudden left sided weakness at 0955. Slurred speech. Headache. Last known well 0954 today. Pt brought to CT table immediately from [facility] center after rapid response. On table at 1023. Code Stroke at 1023. Re[place] aware at 1023.

## 2019-01-29 NOTE — H&P (Signed)
History and Physical  Erica Young ATF:[id number] DOB: [birth year] DOA: 01/29/2019  PCP: Erica Young  Oncologist: Erica Young  Chief Complaint: Left-sided weakness  HPI:  78 year old woman PMH stage IV breast cancer currently on chemotherapy PRESENTED to the [facility] emergency department after episode of syncope with associated left-sided weakness at outpatient cancer infusion center.  Patient reportedly lost consciousness but maintained airway and heart rate, episode lasted about 2 minutes.  Left-sided facial droop along with slurred speech and weakness in left hand and left foot were noticed as well as mild aphasia.   ED Course: EDP discussed with neurology Dr. Leonel Young, because of patient's known brain lesion, not felt to be a candidate for aggressive intervention and recommendation was to admit to [facility] for stroke evaluation.  EDP reported perhaps minimal left-sided weakness although effort was poor, no gross deficits.  Speech reported to be clear.  Treated with Xanax.  Patient recalls going to the clinic today for infusion therapy.  She notes with the second infusion she had a headache and then passed out.  She does recall left-sided weakness and difficulty speaking but states both of these issues have improved.  She has chronic headaches both frontal and occipital, especially left frontal sinus.  Headaches occur frequently.  She is very anxious.  Chart review:  PCP office visit 7/30, follow-up for preventative health exam, dyslipidemia, essential hypertension well-controlled, allergic rhinitis, insomnia, anxiety patient takes Xanax 0.25 mg 3 times daily  Oncology office visit 8/6, follow-up stage IV breast cancer at that time noted to be well controlled and tolerating chemotherapy.  No COVID symptoms.  Review of Systems:  Negative for fever, visual changes, sore throat, rash, new muscle aches, chest pain, SOB, dysuria, bleeding, n/v/abdominal  pain.  Past Medical History:  Diagnosis Date   Allergy    Anxiety    Bone cancer ([place]) mri 11/11/14   right parietal bone,left cribriform plate metastases   Cancer ([place]) 2007   right breat ca  , lumpectomy and radiation tx (declined chemo and additional prophylactic meds)   Cataract    both eyes  10/2016 Lt.     04/2017 Rt.    CEREBROVASCULAR DISEASE 03/03/2010   Diverticulitis    DIVERTICULOSIS, COLON 03/03/2010   Fatty liver 08/02/09   as per U/S done by [facility]   Foramen ovale    positive bubble study   GERD (gastroesophageal reflux disease)    Headache(784.0)    she thinks sinus headaches   History of cerebral artery stenosis    right middle   HYPERLIPIDEMIA 03/03/2010   Hypertension    HYPOTHYROIDISM 03/03/2010   no longer on meds   Internal hemorrhoid    Low back pain    Mastoiditis    noted on brain MRI   Rib fracture    Right leg pain    Shortness of breath    Stroke [facility]) Oct. 2011   TIA   Ulcer     Past Surgical History:  Procedure Laterality Date   BREAST LUMPECTOMY     right   CATARACT EXTRACTION Left    10/2016   Rt 04/2017   CHOLECYSTECTOMY     COLONOSCOPY     POLYPECTOMY     benign cecum   SHOULDER SURGERY     right shoulder (dislocation)   TONSILLECTOMY     TUBAL LIGATION     UPPER GASTROINTESTINAL ENDOSCOPY     VIDEO BRONCHOSCOPY WITH ENDOBRONCHIAL ULTRASOUND N/A 01/07/2014  Procedure: VIDEO BRONCHOSCOPY WITH ENDOBRONCHIAL ULTRASOUND;  Surgeon: Ivin Poot, Young;  Location: [place];  Service: Thoracic;  Laterality: N/A;     reports that she quit smoking about 27 years ago. Her smoking use included cigarettes. She has a 10.00 pack-year smoking history. She has never used smokeless tobacco. She reports that she does not drink alcohol or use drugs. Mobility: Ambulatory  Allergies  Allergen Reactions   Ciprofloxacin Anaphylaxis    Family History  Problem Relation Age of Onset   Heart disease  Sister        cerebral vascular disease also   Heart disease Brother        cerebral vascular disease also   Heart disease Brother        cerebral vascular disease   Heart disease Sister        cebreal vascular disease also   Heart disease Sister        cebreal vascular diease also   Heart disease Sister        cerebral vascular diease also   Stomach cancer Father    Colon cancer Neg Hx    Esophageal cancer Neg Hx    Rectal cancer Neg Hx    Colon polyps Neg Hx    Asthma Neg Hx      Prior to Admission medications   Medication Sig Start Date End Date Taking? Authorizing Provider  ALPRAZolam Duanne Moron) 0.25 MG tablet TAKE 1 TABLET(0.25 MG) BY MOUTH THREE TIMES DAILY AS NEEDED 11/05/18   Erica Young  amoxicillin (AMOXIL) 875 MG tablet Take 1 tablet (875 mg total) by mouth 2 (two) times daily. 01/23/19   Deno Etienne, DO  aspirin EC 81 MG tablet Take 81 mg by mouth daily after breakfast.     Provider, Historical, Young  Cholecalciferol (VITAMIN D PO) Take 1,000 Units by mouth 2 (two) times daily.    Provider, Historical, Young  diltiazem (CARTIA XT) 120 MG 24 hr capsule Take 1 capsule (120 mg total) by mouth daily. 10/07/18   Erica Young  docusate sodium (COLACE) 100 MG capsule Take 100 mg by mouth daily as needed for mild constipation.    Provider, Historical, Young  fluorometholone (FML) 0.1 % ophthalmic suspension  11/26/18   Provider, Historical, Young  fluticasone (FLONASE) 50 MCG/ACT nasal spray Place 2 sprays into both nostrils daily. 11/27/18   Erica Young  FULVESTRANT IM Inject into the muscle. Takes every 28 days    Provider, Historical, Young  Glucosamine 500 MG CAPS Take 500 mg by mouth daily.     Provider, Historical, Young  loratadine (CLARITIN) 10 MG tablet Take 1 tablet (10 mg total) by mouth daily. 03/18/18   Erica Young  metroNIDAZOLE (FLAGYL) 500 MG tablet Take 1 tablet (500 mg total) by mouth 2 (two) times  daily. 01/23/19   Deno Etienne, DO  OVER THE COUNTER MEDICATION Beno for gas, takes twice daily    Provider, Historical, Young  palbociclib (IBRANCE) 75 MG tablet Take 1 tablet (75 mg total) by mouth daily. Take for 21 days on, 7 days off, repeat every 28 days. 11/05/18   Magrinat, Virgie Dad, Young  Probiotic Product (PROBIOTIC DAILY PO) Take 1 tablet by mouth daily.     Provider, Historical, Young    Physical Exam: Vitals:   01/29/19 1623 01/29/19 1631  BP:    Pulse: 85 87  Resp: 20 (!) 27  Temp:  SpO2: 95% 93%    Constitutional:    Appears very anxious, tearful, but not uncomfortable. Eyes:   pupils and irises appear normal  Normal lids  ENMT:   grossly normal hearing   Lips appear normal Neck:   neck appears normal, no masses  no thyromegaly Respiratory:   CTA bilaterally, no w/r/r.   Respiratory effort normal. Cardiovascular:   RRR, no m/r/g  No LE extremity edema   Abdomen:   Abdomen appears normal; no tenderness   No hernias Musculoskeletal:   Digits/nails BUE: no clubbing, cyanosis, petechiae, infection  RUE, LUE, RLE, LLE   o Tone appears grossly normal in all extremities.  No atrophy, no abnormal movements.  Difficult to gauge strength as there appears to be poor effort with all commands.  There may be slight left-sided weakness arm and leg compared to right. o No tenderness, masses Skin:   No rashes, lesions, ulcers  palpation of skin: no induration or nodules Neurologic:   CN 2-12 appear intact although there is suboptimal effort. Psychiatric:   Mental status o Mood difficult to gauge, affect very anxious  judgment and insight focal to gauge   I have personally reviewed following labs and imaging studies  Labs:  Complete metabolic panel unremarkable WBC stable at 2.3, hemoglobin 12.6, platelets 296.  Imaging studies:   CT head no acute intracranial hemorrhage or demarcated acute cortical infarction.  Partially calcified mass again seen  left cribriform plate, known right parietal bone metastasis.  CTA head no large vessel occlusion, 3 mm aneurysm left MCA unchanged, possible 1 mm aneurysm right M2 branch, mass left cribriform plate may reflect metastasis and has increased in size since MRI 10/2014.  CTA neck no significant stenosis internal carotid arteries or common carotid arteries.  Medical tests:   EKG independently reviewed: Sinus rhythm, no acute changes  Principal Problem:   Acute left-sided weakness Active Problems:   Cancer of right breast, stage 4 (HCC)   Leukopenia due to antineoplastic chemotherapy (HCC)   Anxiety   Assessment/Plan Left upper and left lower extremity weakness, syncope, reported dysarthria or a aphasia prior to presentation.  Headache reported. --EDP reported suboptimal effort and no gross motor deficits or speech deficits --Effort appeared poor on exam, may have a mild left upper and left lower extremity weakness.  Speech is fluent and clear on my exam as well as by report of son at bedside. --Differential would include stroke versus TIA.  CT head findings show known metastases.  CTA no evidence of large vessel occlusion. --Transferred to [facility] per neurology recommendation for stroke team evaluation. --2D echocardiogram, therapy evaluations, stroke protocol --Etiology of syncope unclear.  No signs or symptoms to suggest ACS and EKG is nonacute. --Patient with chronic headaches and chronic frontal sinus pain which she currently has now.  Aneurysms were noted on CT, however given the chronic nature of her headaches, I do not think there is any reason to suspect SAH or other bleed  Leukopenia secondary to chemotherapy --Monitor  Stage IV breast cancer with metastatic disease to the left cribriform plate and right parietal bone currently undergoing chemotherapy --Per oncology  Anxiety --Continue Xanax 3 times daily as needed  Severity of Illness: The appropriate patient status for  this patient is OBSERVATION. Observation status is judged to be reasonable and necessary in order to provide the required intensity of service to ensure the patient's safety. The patient's presenting symptoms, physical exam findings, and initial radiographic and laboratory data in the context  of their medical condition is felt to place them at decreased risk for further clinical deterioration. Furthermore, it is anticipated that the patient will be medically stable for discharge from the hospital within 2 midnights of admission. The following factors support the patient status of observation.   " The patient's presenting symptoms include left-sided weakness and aphasia. " The physical exam findings include possible left-sided weakness arm and leg.  Severe anxiety. " The initial radiographic and laboratory data are notable for known metastatic disease to the brain, leukopenia  DVT prophylaxis: SCDs Code Status: Full code Family Communication: son at bedside Consults called: neurology    Time spent: 66 minutes  Murray Hodgkins, Young  [facility] Direct contact: see [email or web address]  7PM-7AM contact night coverage as below   1. Check the care team in [facility] and look for a) attending/consulting [facility] provider listed and b) the [facility] team listed 2. Log into [email or web address] and use [place]'s universal password to access. If you do not have the password, please contact the hospital operator. 3. Locate the [facility] provider you are looking for under [facility] and page to a number that you can be directly reached. 4. If you still have difficulty reaching the provider, please page the [facility] (Director on Call) for the Hospitalists listed on amion for assistance.   01/29/2019, 4:45 PM

## 2019-01-29 NOTE — Progress Notes (Signed)
Pt receiving Faslodex injection (not first time, tolerated well before) in flush room at [facility].  At end of injection pt reported sudden onset severe headache and became dizzy.  Pt leaned on LPN and lost consciousness, airway & HR still maintained.  Pt lowered into chair.  Syncopal episode lasted about 2 minutes, ended around 0955 when pt became more aware.  Placed on 2L [place].  No diaphoresis or clamminess.  BP elevated but stable.  PA Lucianne Lei at chairside to assess.  Attempted to start orthostatic VS when pt started becoming somnolent again.  L side facial droop developed, along with slurred speech, and weakness in L hand grip and L foot flexion and extension.  Pt denies blurry vision or loss of visual fields.  PERRLA.  A&OX4, however appears to have some mild aphasia/trouble keeping track of conversation and repeats things often/takes a long time to respond to questions.  Rapid Response called at 1000.  20G LH IV started.  VS remained stable, airway and HR maintined.  Pt tearful, requests that son be notified but that husband not be notified d/t him being ill.  Son contacted, will meet the pt in [place].  Pt transferred to stretcher.  RR arrived 1015.  Report given to ED Charge RN Ages.  Pt transported with belongings via stretcher to [place], report also given bedside to [employer].

## 2019-01-29 NOTE — Consult Note (Signed)
[facility] Tele[facility]urology Consult Services   Date of Service:   01/29/2019 10:31:11  Impression:     .  Rule Out Acute Ischemic Stroke  Comments/Sign-Out: 24 F, h/o metastatic breast ca, here with syncope episode after a chemo injection, and subsequent L sided wkness and dysarthria upon coming to. NIHSS 4. Ddx: adverse rxn to injection vs stroke/TIA vs seizure with post ictal paralysis.   Head CT shows: 1. No evidence of intracranial hemorrhage or acute demarcated cortical infarction 2. Incompletely imaged partially calcified mass centered in the region of the left cribriform plate and left ethmoid sinuses. As before, there is erosion through the cribriform plate with intracranial extension. The mass measures 3.8 x 3.1 cm (AP x TV) and at least 3.3 cm in craniocaudal dimension (previously 2.8 x 2.8 x 2.8 cm on MRI 11/11/2014). Complete opacification of obstructed left frontal and left sphenoid sinuses. 3. Known right parietal bone metastasis.     CTA head: 1. No intracranial large vessel occlusion or proximal high-grade arterial stenosis identified. 2. 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 3. Question tiny 1 mm aneurysm arising from a right M2 branch vessel, as described. 4. 4.1 x 3.0 x 5.0 cm mass centered in the region of the left cribriform plate/left ethmoid sinuses with expansile bony remodeling and osseous erosion as described. As before, there is erosion of the cribriform plate with some extension into the intracranial compartment. Although non-specific, this mass may reflect a metastasis and has increased in size since MRI 11/11/2014.  CTA neck: 1. The bilateral common and cervical internal carotid arteries are widely patent without significant stenosis 2. Streak artifact from a dense left-sided contrast bolus limits evaluation of the V1 and proximal V2 left vertebral artery. Within this limitation,  the bilateral cervical vertebral arteries are patent without significant stenosis. 3. Mild atherosclerotic disease within the visualized aortic arch.  Due to the presence of the cribiform plate lesion with intracranial extension, and the overall mildness of her sx at this time, she is not an alteplase candidate.   PLAN  - MRI brain WITH and WITHOUT contrast  - TTE  - check a1c and LDL  - monitor on tele for afib - neuro to follow  --  Metrics: Last Known Well: 01/29/2019 09:55:00 [facility] Notification Time: 01/29/2019 10:31:11 Arrival Time: 01/29/2019 10:23:00 Stamp Time: 01/29/2019 10:31:11 Time First Login Attempt: 01/29/2019 10:36:31 Video Start Time: 01/29/2019 10:36:31  Symptoms: LOC and L sided wkness NIHSS Start Assessment Time: 01/29/2019 10:44:34 Patient is not a candidate for Alteplase/Activase. Patient was not deemed candidate for Alteplase/Activase thrombolytics because of Intracranial Intra-Axial Neoplasm. Video End Time: 01/29/2019 11:08:53  CT head showed no acute hemorrhage or acute core infarct.  Lower Likelihood of Large Vessel Occlusion but Following Stat Studies are Recommended  CTA Head and Neck.  ED Physician notified of diagnostic impression and management plan on 01/29/2019 11:08:56  Sign Out:     .  Discussed with Emergency Department Provider  ------------------------------------------------------------------------------  History of Present Illness: Patient is a 78 year old Female.  Patient was brought by EMS for symptoms of LOC and L sided wkness  37 F, h/o metastatic breast ca, who was getting '2 chemo injections' in the buttocks today. After the second injection, she developed an acute onset sudden HA, felt hot all over, had a sour taste in her mouth, and then passed out. When she came to, staff noted that she had a L facial droop  and was weak on the L side. EMTs notified and pt brought to ED for eval. She recalls the moments right up  to passing out. NIHSS 4 for L nasolabial fold asymmetry, LUE and LLE drift, and mild dysarthria. No history of stroke in the past but reported a TIA 10 yrs ago.   Examination: BP(151/79), Pulse(81), Blood Glucose(96) 1A: Level of Consciousness - Alert; keenly responsive + 0 1B: Ask Month and Age - Both Questions Right + 0 1C: Blink Eyes & Squeeze Hands - Performs Both Tasks + 0 2: Test Horizontal Extraocular Movements - Normal + 0 3: Test Visual Fields - No Visual Loss + 0 4: Test Facial Palsy (Use Grimace if Obtunded) - Minor paralysis (flat nasolabial fold, smile asymmetry) + 1 5A: Test Left Arm Motor Drift - Drift, but doesn't hit bed + 1 5B: Test Right Arm Motor Drift - No Drift for 10 Seconds + 0 6A: Test Left Leg Motor Drift - Drift, but doesn't hit bed + 1 6B: Test Right Leg Motor Drift - No Drift for 5 Seconds + 0 7: Test Limb Ataxia (FNF/Heel-Shin) - No Ataxia + 0 8: Test Sensation - Normal; No sensory loss + 0 9: Test Language/Aphasia - Normal; No aphasia + 0 10: Test Dysarthria - Mild-Moderate Dysarthria: Slurring but can be understood + 1 11: Test Extinction/Inattention - No abnormality + 0  NIHSS Score: 4  Patient/Family was informed the Neurology Consult would happen via TeleHealth consult by way of interactive audio and video telecommunications and consented to receiving care in this manner.   Due to the immediate potential for life-threatening deterioration due to underlying acute neurologic illness, I spent 20 minutes providing critical care. This time includes time for face to face visit via telemedicine, review of medical records, imaging studies and discussion of findings with providers, the patient and/or family.   Dr Burtis Junes   [facility] [phone]   Case [id number]

## 2019-01-29 NOTE — ED Provider Notes (Signed)
[employer] DEPT Provider Note   CSN: [id number] Arrival date & time: 01/29/19  1025     History   Chief Complaint Chief Complaint  Patient presents with   Code Stroke    HPI Ronee Ranganathan is a 78 y.o. female.     The history is provided by the patient, medical records and a caregiver. No language interpreter was used.   Laurette Villescas is a 78 y.o. female who presents to the Emergency Department complaining of weakness. She presents to the emergency department from the cancer center after developing sudden onset weakness. She was last known well at 49 today. She received an injection and started her treatment when she syncopal eyes and was unconscious for about two minutes. When she awoke she had a headache, slurred speech and left-sided weakness. She was transferred emergently to the emergency department and a code stroke was activated on ED arrival. 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 She complains of generalized weakness, no additional complaints. 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     04/2017 Rt.    CEREBROVASCULAR DISEASE 03/03/2010   Diverticulitis    DIVERTICULOSIS, COLON 03/03/2010   Fatty liver 08/02/09   as per U/S done by [facility]   Foramen ovale    positive bubble study   GERD (gastroesophageal reflux disease)    Headache(784.0)    she thinks sinus headaches   History of cerebral artery stenosis    right middle   HYPERLIPIDEMIA 03/03/2010   Hypertension    HYPOTHYROIDISM 03/03/2010   no longer on meds   Internal hemorrhoid    Low back pain    Mastoiditis    noted on brain MRI   Rib fracture    Right leg pain     Shortness of breath    Stroke [facility]) Oct. 2011   TIA   Ulcer     Patient Active Problem List   Diagnosis Date Noted   Acute left-sided weakness 01/29/2019   Aortic atherosclerosis ([place]) 01/03/2018   History of epistaxis 10/15/2017   GI symptoms/possible food intolerance 06/18/2017   Malignant neoplasm of upper-outer quadrant of right breast in female, estrogen receptor positive ([place]) 01/19/2016   Bone metastases ([place]) 11/22/2014   Perennial allergic rhinitis with a nonallergic component 02/12/2014   Cancer of right breast, stage 4 ([place]) 01/23/2014   History of breast cancer 01/11/2014   Chronic rhinitis 09/17/2013   Hilar adenopathy 05/12/2013   Abdominal pain 05/07/2011   Hypertension 06/09/2010   BACK PAIN 04/10/2010   Dyslipidemia 03/03/2010   Cerebrovascular disease 03/03/2010   DIVERTICULOSIS, COLON 03/03/2010    Past Surgical History:  Procedure Laterality Date   BREAST LUMPECTOMY     right   CATARACT EXTRACTION Left    10/2016   Rt 04/2017   CHOLECYSTECTOMY     COLONOSCOPY     POLYPECTOMY     benign cecum   SHOULDER SURGERY     right shoulder (dislocation)   TONSILLECTOMY     TUBAL LIGATION     UPPER GASTROINTESTINAL ENDOSCOPY     VIDEO BRONCHOSCOPY WITH ENDOBRONCHIAL ULTRASOUND N/A 01/07/2014   Procedure: VIDEO  BRONCHOSCOPY WITH ENDOBRONCHIAL ULTRASOUND;  Surgeon: Ivin Poot, MD;  Location: [facility] OR;  Service: Thoracic;  Laterality: N/A;     OB History    Gravida  4   Para  2   Term      Preterm      AB      Living        SAB      TAB      Ectopic      Multiple      Live Births               Home Medications    Prior to Admission medications   Medication Sig Start Date End Date Taking? Authorizing Provider  ALPRAZolam Duanne Moron) 0.25 MG tablet TAKE 1 TABLET(0.25 MG) BY MOUTH THREE TIMES DAILY AS NEEDED 11/05/18   Isaac Bliss, Rayford Halsted, MD  amoxicillin (AMOXIL) 875 MG tablet Take 1 tablet (875 mg total)  by mouth 2 (two) times daily. 01/23/19   Deno Etienne, DO  aspirin EC 81 MG tablet Take 81 mg by mouth daily after breakfast.     [provider]  Cholecalciferol (VITAMIN D PO) Take 1,000 Units by mouth 2 (two) times daily.    [provider]  diltiazem (CARTIA XT) 120 MG 24 hr capsule Take 1 capsule (120 mg total) by mouth daily. 10/07/18   Isaac Bliss, Rayford Halsted, MD  docusate sodium (COLACE) 100 MG capsule Take 100 mg by mouth daily as needed for mild constipation.    [provider]  fluorometholone (FML) 0.1 % ophthalmic suspension  11/26/18   [provider]  fluticasone (FLONASE) 50 MCG/ACT nasal spray Place 2 sprays into both nostrils daily. 11/27/18   Isaac Bliss, Rayford Halsted, MD  FULVESTRANT IM Inject into the muscle. Takes every 28 days    [provider]  Glucosamine 500 MG CAPS Take 500 mg by mouth daily.     [provider]  loratadine (CLARITIN) 10 MG tablet Take 1 tablet (10 mg total) by mouth daily. 03/18/18   Isaac Bliss, Rayford Halsted, MD  metroNIDAZOLE (FLAGYL) 500 MG tablet Take 1 tablet (500 mg total) by mouth 2 (two) times daily. 01/23/19   Deno Etienne, DO  OVER THE COUNTER MEDICATION [place] for gas, takes twice daily    [provider]  palbociclib (IBRANCE) 75 MG tablet Take 1 tablet (75 mg total) by mouth daily. Take for 21 days on, 7 days off, repeat every 28 days. 11/05/18   Magrinat, Virgie Dad, MD  Probiotic Product (PROBIOTIC DAILY PO) Take 1 tablet by mouth daily.     [provider]    Family History Family History  Problem Relation Age of Onset   Heart disease Sister        cerebral vascular disease also   Heart disease Brother        cerebral vascular disease also   Heart disease Brother        cerebral vascular disease   Heart disease Sister        cebreal vascular disease also   Heart disease Sister        cebreal vascular diease also   Heart disease Sister        cerebral vascular  diease also   Stomach cancer Father    Colon cancer Neg Hx    Esophageal cancer Neg Hx    Rectal cancer Neg Hx    Colon polyps Neg Hx    Asthma  Neg Hx     Social History Social History   Tobacco Use   Smoking status: Former Smoker    Packs/day: 0.50    Years: 20.00    Pack years: 10.00    Types: Cigarettes    Quit date: 05/01/1991    Years since quitting: 27.7   Smokeless tobacco: Never Used  Substance Use Topics   Alcohol use: No    Alcohol/week: 0.0 standard drinks   Drug use: No     Allergies   Ciprofloxacin   Review of Systems Review of Systems  All other systems reviewed and are negative.    Physical Exam Updated Vital Signs BP (!) 173/85    Pulse 81    Temp (!) 97.5 F (36.4 C) (Oral)    Resp (!) 27    SpO2 95%   Physical Exam Vitals signs and nursing note reviewed.  Constitutional:      Appearance: She is well-developed.  HENT:     Head: Normocephalic and atraumatic.  Cardiovascular:     Rate and Rhythm: Normal rate and regular rhythm.     Heart sounds: No murmur.  Pulmonary:     Effort: Pulmonary effort is normal. No respiratory distress.     Breath sounds: Normal breath sounds.  Abdominal:     Palpations: Abdomen is soft.     Tenderness: There is no abdominal tenderness. There is no guarding or rebound.  Musculoskeletal:        General: No tenderness.  Skin:    General: Skin is warm and dry.  Neurological:     Mental Status: She is alert.     Comments: Generalized weakness. Poor effort on facial testing but there is a slight left lower facial droop. There is mild dysarthria.  Psychiatric:     Comments: Anxious and tearful      ED Treatments / Results  Labs (all labs ordered are listed, but only abnormal results are displayed) Labs Reviewed  CBC - Abnormal; Notable for the following components:      Result Value   WBC 2.3 (*)    RBC 3.36 (*)    MCV 112.5 (*)    MCH 37.5 (*)    All other components within normal limits    DIFFERENTIAL - Abnormal; Notable for the following components:   Neutro Abs 1.3 (*)    All other components within normal limits  COMPREHENSIVE METABOLIC PANEL - Abnormal; Notable for the following components:   Calcium 8.8 (*)    All other components within normal limits  I-STAT CHEM 8, ED - Abnormal; Notable for the following components:   Hemoglobin 11.9 (*)    HCT 35.0 (*)    All other components within normal limits  SARS CORONAVIRUS 2 (TAT 6-24 HRS)  PROTIME-INR  APTT  CBG MONITORING, ED    EKG None  Radiology Ct Angio Head W/cm &/or Wo Cm  Result Date: 01/29/2019 CLINICAL DATA:  Left-sided weakness, slurred speech, headache. EXAM: CT ANGIOGRAPHY HEAD AND NECK TECHNIQUE: Multidetector CT imaging of the head and neck was performed using the standard protocol during bolus administration of intravenous contrast. Multiplanar CT image reconstructions and MIPs were obtained to evaluate the vascular anatomy. Carotid stenosis measurements (when applicable) are obtained utilizing NASCET criteria, using the distal internal carotid diameter as the denominator. CONTRAST:  138m OMNIPAQUE IOHEXOL 350 MG/ML SOLN COMPARISON:  Noncontrast head CT 11/29/2018, brain MRI 11/11/2014, MRA head neck 12/19/2009, nuclear medicine bone scan 03/17/2018 FINDINGS: CTA NECK FINDINGS Aortic arch:  Common origin of the innominate and left common carotid arteries. Included portions of the aortic arch demonstrate no evidence of dissection or aneurysm. Mild atherosclerotic calcification of the visualized aortic arch. Right carotid system: CCA and ICA widely patent within the neck without significant stenosis. Left carotid system: CCA and ICA widely patent within the neck without significant stenosis. Vertebral arteries: The right vertebral artery is slightly dominant. Streak artifact from a dense left-sided contrast bolus limits evaluation of the V1 and proximal V2 left vertebral artery. Within this limitation, the  bilateral vertebral arteries are patent within the neck without significant stenosis (50% or greater). Skeleton: Mild cervical spondylosis. Known right parietal calvarial metastasis. Please refer to previous nuclear medicine bone scan 03/17/2018 for description of additional osseous metastases. Other neck: No mass or enlarged lymph nodes. The thyroid gland is mildly heterogeneous but otherwise unremarkable. Upper chest: No consolidation within the imaged lung apices. Review of the MIP images confirms the above findings CTA HEAD FINDINGS Anterior circulation: Mild scattered soft and calcified plaque within the intracranial internal carotid arteries. The intracranial internal carotid arteries are patent bilaterally without significant stenosis. Right middle and anterior cerebral arteries patent without high-grade proximal stenosis. Left middle and anterior cerebral arteries patent without high-grade proximal stenosis. 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 A tiny 1 mm aneurysm arising from a right M2 branch vessel is also questioned (series 7, image 230) (series 9, image 63). Posterior circulation: Intracranial vertebral arteries are patent without high-grade stenosis. Basilar artery is patent without high-grade stenosis. Bilateral posterior cerebral arteries patent without high-grade proximal stenosis. Predominantly fetal origin of the right posterior cerebral artery. Venous sinuses: Within limitations of contrast timing, no convincing thrombus. Anatomic variants: None significant Other: Again demonstrated is a mass centered within the region of the left cribriform plate and left ethmoid sinuses. The mass measures approximately 4.1 x 3.0 x 5.0 cm (AP x TV x CC) . There is associated expansile bony remodeling with osseous erosion through the cribriform plate into the intracranial compartment. There is also lateral  bowing/erosion of the left lamina papyracea. The mass extends into the left nasal passage and partly into the left maxillary sinus. Associated complete a opacification of obstructed left frontal and left sphenoid sinuses. There is also extensive partial opacification of bilateral ethmoid air cells. Significant rightward deviation of the bony nasal septum. Previously this mass measured 2.8 x 2.8 x 2.8 cm on MRI 11/11/2014. Review of the MIP images confirms the above findings IMPRESSION: CTA head: 1. No intracranial large vessel occlusion or proximal high-grade arterial stenosis identified. 2. 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 3. Question tiny 1 mm aneurysm arising from a right M2 branch vessel, as described. 4. 4.1 x 3.0 x 5.0 cm mass centered in the region of the left cribriform plate/left ethmoid sinuses with expansile bony remodeling and osseous erosion as described. As before, there is erosion of the cribriform plate with some extension into the intracranial compartment. Although non-specific, this mass may reflect a metastasis and has increased in size since MRI 11/11/2014. CTA neck: 1. The bilateral common and cervical internal carotid arteries are widely patent without significant stenosis 2. Streak artifact from a dense left-sided contrast bolus limits evaluation of the V1 and proximal V2 left vertebral artery. Within this limitation, the bilateral cervical vertebral arteries are patent without significant stenosis. 3. Mild  atherosclerotic disease within the visualized aortic arch. Electronically Signed   By: Kellie Simmering   On: 01/29/2019 12:25   Ct Angio Neck W And/or Wo Contrast  Result Date: 01/29/2019 CLINICAL DATA:  Left-sided weakness, slurred speech, headache. EXAM: CT ANGIOGRAPHY HEAD AND NECK TECHNIQUE: Multidetector CT imaging of the head and neck was performed using the standard protocol during bolus administration of intravenous  contrast. Multiplanar CT image reconstructions and MIPs were obtained to evaluate the vascular anatomy. Carotid stenosis measurements (when applicable) are obtained utilizing NASCET criteria, using the distal internal carotid diameter as the denominator. CONTRAST:  123m OMNIPAQUE IOHEXOL 350 MG/ML SOLN COMPARISON:  Noncontrast head CT 11/29/2018, brain MRI 11/11/2014, MRA head neck 12/19/2009, nuclear medicine bone scan 03/17/2018 FINDINGS: CTA NECK FINDINGS Aortic arch: Common origin of the innominate and left common carotid arteries. Included portions of the aortic arch demonstrate no evidence of dissection or aneurysm. Mild atherosclerotic calcification of the visualized aortic arch. Right carotid system: CCA and ICA widely patent within the neck without significant stenosis. Left carotid system: CCA and ICA widely patent within the neck without significant stenosis. Vertebral arteries: The right vertebral artery is slightly dominant. Streak artifact from a dense left-sided contrast bolus limits evaluation of the V1 and proximal V2 left vertebral artery. Within this limitation, the bilateral vertebral arteries are patent within the neck without significant stenosis (50% or greater). Skeleton: Mild cervical spondylosis. Known right parietal calvarial metastasis. Please refer to previous nuclear medicine bone scan 03/17/2018 for description of additional osseous metastases. Other neck: No mass or enlarged lymph nodes. The thyroid gland is mildly heterogeneous but otherwise unremarkable. Upper chest: No consolidation within the imaged lung apices. Review of the MIP images confirms the above findings CTA HEAD FINDINGS Anterior circulation: Mild scattered soft and calcified plaque within the intracranial internal carotid arteries. The intracranial internal carotid arteries are patent bilaterally without significant stenosis. Right middle and anterior cerebral arteries patent without high-grade proximal stenosis. Left  middle and anterior cerebral arteries patent without high-grade proximal stenosis. 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 A tiny 1 mm aneurysm arising from a right M2 branch vessel is also questioned (series 7, image 230) (series 9, image 63). Posterior circulation: Intracranial vertebral arteries are patent without high-grade stenosis. Basilar artery is patent without high-grade stenosis. Bilateral posterior cerebral arteries patent without high-grade proximal stenosis. Predominantly fetal origin of the right posterior cerebral artery. Venous sinuses: Within limitations of contrast timing, no convincing thrombus. Anatomic variants: None significant Other: Again demonstrated is a mass centered within the region of the left cribriform plate and left ethmoid sinuses. The mass measures approximately 4.1 x 3.0 x 5.0 cm (AP x TV x CC) . There is associated expansile bony remodeling with osseous erosion through the cribriform plate into the intracranial compartment. There is also lateral bowing/erosion of the left lamina papyracea. The mass extends into the left nasal passage and partly into the left maxillary sinus. Associated complete a opacification of obstructed left frontal and left sphenoid sinuses. There is also extensive partial opacification of bilateral ethmoid air cells. Significant rightward deviation of the bony nasal septum. Previously this mass measured 2.8 x 2.8 x 2.8 cm on MRI 11/11/2014. Review of the MIP images confirms the above findings IMPRESSION: CTA head: 1. No intracranial large vessel occlusion or proximal high-grade arterial stenosis identified. 2. 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 3. Question tiny 1 mm aneurysm arising from a right M2 branch vessel, as described. 4. 4.1 x 3.0 x 5.0 cm mass centered in the region of the left cribriform  plate/left ethmoid sinuses with expansile bony remodeling and osseous erosion as described. As before, there is erosion of the cribriform plate with some extension into the intracranial compartment. Although non-specific, this mass may reflect a metastasis and has increased in size since MRI 11/11/2014. CTA neck: 1. The bilateral common and cervical internal carotid arteries are widely patent without significant stenosis 2. Streak artifact from a dense left-sided contrast bolus limits evaluation of the V1 and proximal V2 left vertebral artery. Within this limitation, the bilateral cervical vertebral arteries are patent without significant stenosis. 3. Mild atherosclerotic disease within the visualized aortic arch. Electronically Signed   By: Kellie Simmering   On: 01/29/2019 12:25   Ct Head Code Stroke Wo Contrast  Result Date: 01/29/2019 CLINICAL DATA:  Code stroke. Cerebral hemorrhage suspected, possible stroke. Additional history provided: Patient at [facility] with sudden left-sided weakness at 09:55. Slurred speech. EXAM: CT HEAD WITHOUT CONTRAST TECHNIQUE: Contiguous axial images were obtained from the base of the skull through the vertex without intravenous contrast. COMPARISON:  Report from head CT 04/11/2015 (images unavailable), brain MRI 7146 FINDINGS: Brain: No evidence of acute intracranial hemorrhage. No demarcated cortical infarction. No midline shift or extra-axial fluid collection. Mild generalized parenchymal atrophy. Partially empty sella turcica. Vascular: No definite hyperdense vessel. Skull: No calvarial fracture. Irregular lucency within the right parietal bone consistent with known right parietal bone metastasis. Sinuses/Orbits: Incompletely imaged partially calcified mass centered in the region of the left cribriform plate and left ethmoid sinuses. As before, there is erosion through the cribriform plate with intracranial extension. Associated bony expansion and remodeling. This includes  redemonstrated lateral bowing/erosion of the left lamina papyracea. The mass extends partly into the left nasal passage and left maxillary sinus. Associated complete opacification of the left frontal and sphenoid sinuses. The mass measures 3.8 x 3.1 cm (AP x TV) and at least 3.3 cm in craniocaudal dimension (series 2, image 1) (series 6, image 27). On prior brain MRI 11/11/2014, this mass measured 2.8 x 2.8 x 2.8 cm ASPECTS [facility] Stroke Program Early CT Score) - Ganglionic level infarction (caudate, lentiform nuclei, internal capsule, insula, M1-M3 cortex): 7 - Supraganglionic infarction (M4-M6 cortex): 3 Total score (0-10 with 10 being normal): 10 These results were called by telephone at the time of interpretation on 01/29/2019 at 10:53 am to provider [facility] , who verbally acknowledged these results. IMPRESSION: 1. No evidence of intracranial hemorrhage or acute demarcated cortical infarction 2. Incompletely imaged partially calcified mass centered in the region of the left cribriform plate and left ethmoid sinuses. As before, there is erosion through the cribriform plate with intracranial extension. The mass measures 3.8 x 3.1 cm (AP x TV) and at least 3.3 cm in craniocaudal dimension (previously 2.8 x 2.8 x 2.8 cm on MRI 11/11/2014). Complete opacification of obstructed left frontal and left sphenoid sinuses. 3. Known right parietal bone metastasis. Electronically Signed   By: Kellie Simmering   On: 01/29/2019 11:06    Procedures Procedures (including critical care time)  Medications Ordered in ED Medications  sodium chloride flush (NS) 0.9 % injection 3 mL (0 mLs Intravenous Hold 01/29/19 1041)  sodium chloride (PF) 0.9 % injection (has no administration in time range)  amoxicillin (AMOXIL) tablet 875 mg (has no administration in time  range)  metroNIDAZOLE (FLAGYL) tablet 500 mg (has no administration in time range)  iohexol (OMNIPAQUE) 350 MG/ML injection 100 mL (100 mLs Intravenous Contrast  Given 01/29/19 1114)  LORazepam (ATIVAN) injection 0.5 mg (0.5 mg Intravenous Given 01/29/19 1420)     Initial Impression / Assessment and Plan / ED Course  I have reviewed the triage vital signs and the nursing notes.  Pertinent labs & imaging results that were available during my care of the patient were reviewed by me and considered in my medical decision making (see chart for details).        Patient presents from cancer center following syncopal events followed by headache and left-sided weakness. Patient is anxious on examination with mostly generalized weakness. She does have some mild left facial droop. A code stroke was activated and she was evaluated by tele-neurology. She does not appear to be a TPA candidate given her known brain met in her examination. Plan to admit for further workup. Discussed with neurologist, Dr. Leonel Ramsay. Hospitalist consulted for admission. Patient updated of findings of studies and recommendation for admission and she is in agreement with treatment plan.  Final Clinical Impressions(s) / ED Diagnoses   Final diagnoses:  None    ED Discharge Orders    None       Quintella Reichert, MD 01/29/19 1517

## 2019-01-29 NOTE — Progress Notes (Signed)
Pt returned to receive Faslodex injection today. In the middle of the second injection PT stated that her head started hurting and had a syncopal episode. This Nurse lowered Pt to chair and contacted symptoms management for evaluation. Pt started experiencing left sided weakness predominantly in her face.symptoms management took over and [facility] was called. Son was called and notified.

## 2019-01-29 NOTE — Patient Instructions (Signed)
COVID-19: How to Protect Yourself and Others Know how it spreads  There is currently no vaccine to prevent coronavirus disease 2019 (COVID-19).  The best way to prevent illness is to avoid being exposed to this virus.  The virus is thought to spread mainly from person-to-person. ? Between people who are in close contact with one another (within about 6 feet). ? Through respiratory droplets produced when an infected person coughs, sneezes or talks. ? These droplets can land in the mouths or noses of people who are nearby or possibly be inhaled into the lungs. ? Some recent studies have suggested that COVID-19 may be spread by people who are not showing symptoms. Everyone should Clean your hands often  Wash your hands often with soap and water for at least 20 seconds especially after you have been in a public place, or after blowing your nose, coughing, or sneezing.  If soap and water are not readily available, use a hand sanitizer that contains at least 60% alcohol. Cover all surfaces of your hands and rub them together until they feel dry.  Avoid touching your eyes, nose, and mouth with unwashed hands. Avoid close contact  Stay home if you are sick.  Avoid close contact with people who are sick.  Put distance between yourself and other people. ? Remember that some people without symptoms may be able to spread virus. ? This is especially important for people who are at higher risk of getting very sick.www.cdc.gov/coronavirus/2019-ncov/need-extra-precautions/people-at-higher-risk.html Cover your mouth and nose with a cloth face cover when around others  You could spread COVID-19 to others even if you do not feel sick.  Everyone should wear a cloth face cover when they have to go out in public, for example to the grocery store or to pick up other necessities. ? Cloth face coverings should not be placed on young children under age 2, anyone who has trouble breathing, or is unconscious,  incapacitated or otherwise unable to remove the mask without assistance.  The cloth face cover is meant to protect other people in case you are infected.  Do NOT use a facemask meant for a healthcare worker.  Continue to keep about 6 feet between yourself and others. The cloth face cover is not a substitute for social distancing. Cover coughs and sneezes  If you are in a private setting and do not have on your cloth face covering, remember to always cover your mouth and nose with a tissue when you cough or sneeze or use the inside of your elbow.  Throw used tissues in the trash.  Immediately wash your hands with soap and water for at least 20 seconds. If soap and water are not readily available, clean your hands with a hand sanitizer that contains at least 60% alcohol. Clean and disinfect  Clean AND disinfect frequently touched surfaces daily. This includes tables, doorknobs, light switches, countertops, handles, desks, phones, keyboards, toilets, faucets, and sinks. www.cdc.gov/coronavirus/2019-ncov/prevent-getting-sick/disinfecting-your-home.html  If surfaces are dirty, clean them: Use detergent or soap and water prior to disinfection.  Then, use a household disinfectant. You can see a list of EPA-registered household disinfectants here. cdc.gov/coronavirus 09/02/2018 This information is not intended to replace advice given to you by your health care provider. Make sure you discuss any questions you have with your health care provider. Document Released: 08/12/2018 Document Revised: 09/10/2018 Document Reviewed: 08/12/2018 Elsevier Patient Education  2020 [employer]  

## 2019-01-29 NOTE — Patient Instructions (Signed)
Fulvestrant injection What is this medicine? FULVESTRANT (ful VES trant) blocks the effects of estrogen. It is used to treat breast cancer. This medicine may be used for other purposes; ask your health care provider or pharmacist if you have questions. COMMON BRAND NAME(S): FASLODEX What should I tell my health care provider before I take this medicine? They need to know if you have any of these conditions:  bleeding disorders  liver disease  low blood counts, like low white cell, platelet, or red cell counts  an unusual or allergic reaction to fulvestrant, other medicines, foods, dyes, or preservatives  pregnant or trying to get pregnant  breast-feeding How should I use this medicine? This medicine is for injection into a muscle. It is usually given by a health care professional in a hospital or clinic setting. Talk to your pediatrician regarding the use of this medicine in children. Special care may be needed. Overdosage: If you think you have taken too much of this medicine contact a poison control center or emergency room at once. NOTE: This medicine is only for you. Do not share this medicine with others. What if I miss a dose? It is important not to miss your dose. Call your doctor or health care professional if you are unable to keep an appointment. What may interact with this medicine?  medicines that treat or prevent blood clots like warfarin, enoxaparin, dalteparin, apixaban, dabigatran, and rivaroxaban This list may not describe all possible interactions. Give your health care provider a list of all the medicines, herbs, non-prescription drugs, or dietary supplements you use. Also tell them if you smoke, drink alcohol, or use illegal drugs. Some items may interact with your medicine. What should I watch for while using this medicine? Your condition will be monitored carefully while you are receiving this medicine. You will need important blood work done while you are taking  this medicine. Do not become pregnant while taking this medicine or for at least 1 year after stopping it. Women of child-bearing potential will need to have a negative pregnancy test before starting this medicine. Women should inform their doctor if they wish to become pregnant or think they might be pregnant. There is a potential for serious side effects to an unborn child. Men should inform their doctors if they wish to father a child. This medicine may lower sperm counts. Talk to your health care professional or pharmacist for more information. Do not breast-feed an infant while taking this medicine or for 1 year after the last dose. What side effects may I notice from receiving this medicine? Side effects that you should report to your doctor or health care professional as soon as possible:  allergic reactions like skin rash, itching or hives, swelling of the face, lips, or tongue  feeling faint or lightheaded, falls  pain, tingling, numbness, or weakness in the legs  signs and symptoms of infection like fever or chills; cough; flu-like symptoms; sore throat  vaginal bleeding Side effects that usually do not require medical attention (report to your doctor or health care professional if they continue or are bothersome):  aches, pains  constipation  diarrhea  headache  hot flashes  nausea, vomiting  pain at site where injected  stomach pain This list may not describe all possible side effects. Call your doctor for medical advice about side effects. You may report side effects to FDA at 1-800-FDA-1088. Where should I keep my medicine? This drug is given in a hospital or clinic and will   not be stored at home. NOTE: This sheet is a summary. It may not cover all possible information. If you have questions about this medicine, talk to your doctor, pharmacist, or health care provider.  2020 Elsevier/Gold Standard (2017-07-25 11:34:41)  

## 2019-01-29 NOTE — ED Notes (Signed)
Per MD Sarajane Jews, pt able to take sips of water with meds.

## 2019-01-30 ENCOUNTER — Other Ambulatory Visit: Payer: Self-pay | Admitting: Oncology

## 2019-01-30 ENCOUNTER — Observation Stay (HOSPITAL_BASED_OUTPATIENT_CLINIC_OR_DEPARTMENT_OTHER): Payer: Medicare Other

## 2019-01-30 ENCOUNTER — Observation Stay (HOSPITAL_COMMUNITY): Payer: Medicare Other

## 2019-01-30 ENCOUNTER — Inpatient Hospital Stay (HOSPITAL_COMMUNITY)
Admission: EM | Admit: 2019-01-30 | Discharge: 2019-01-30 | Disposition: A | Discharge status: Home / Self Care | Payer: Medicare Other | Source: Home / Self Care | Attending: Neurology | Admitting: Neurology

## 2019-01-30 DIAGNOSIS — H259 Unspecified age-related cataract: Secondary | ICD-10-CM | POA: Diagnosis not present

## 2019-01-30 DIAGNOSIS — J349 Unspecified disorder of nose and nasal sinuses: Secondary | ICD-10-CM | POA: Diagnosis present

## 2019-01-30 DIAGNOSIS — R627 Adult failure to thrive: Secondary | ICD-10-CM | POA: Diagnosis present

## 2019-01-30 DIAGNOSIS — Z79899 Other long term (current) drug therapy: Secondary | ICD-10-CM | POA: Diagnosis not present

## 2019-01-30 DIAGNOSIS — Z7401 Bed confinement status: Secondary | ICD-10-CM | POA: Diagnosis not present

## 2019-01-30 DIAGNOSIS — R0902 Hypoxemia: Secondary | ICD-10-CM | POA: Diagnosis not present

## 2019-01-30 DIAGNOSIS — Z66 Do not resuscitate: Secondary | ICD-10-CM | POA: Diagnosis present

## 2019-01-30 DIAGNOSIS — I5021 Acute systolic (congestive) heart failure: Secondary | ICD-10-CM | POA: Diagnosis not present

## 2019-01-30 DIAGNOSIS — I509 Heart failure, unspecified: Secondary | ICD-10-CM | POA: Diagnosis not present

## 2019-01-30 DIAGNOSIS — I6389 Other cerebral infarction: Secondary | ICD-10-CM

## 2019-01-30 DIAGNOSIS — C787 Secondary malignant neoplasm of liver and intrahepatic bile duct: Secondary | ICD-10-CM | POA: Diagnosis present

## 2019-01-30 DIAGNOSIS — G459 Transient cerebral ischemic attack, unspecified: Secondary | ICD-10-CM | POA: Diagnosis not present

## 2019-01-30 DIAGNOSIS — I10 Essential (primary) hypertension: Secondary | ICD-10-CM | POA: Diagnosis not present

## 2019-01-30 DIAGNOSIS — C50911 Malignant neoplasm of unspecified site of right female breast: Secondary | ICD-10-CM | POA: Diagnosis not present

## 2019-01-30 DIAGNOSIS — F419 Anxiety disorder, unspecified: Secondary | ICD-10-CM | POA: Diagnosis not present

## 2019-01-30 DIAGNOSIS — Z7982 Long term (current) use of aspirin: Secondary | ICD-10-CM | POA: Diagnosis not present

## 2019-01-30 DIAGNOSIS — M2550 Pain in unspecified joint: Secondary | ICD-10-CM | POA: Diagnosis not present

## 2019-01-30 DIAGNOSIS — R531 Weakness: Secondary | ICD-10-CM | POA: Diagnosis not present

## 2019-01-30 DIAGNOSIS — C7931 Secondary malignant neoplasm of brain: Secondary | ICD-10-CM | POA: Diagnosis present

## 2019-01-30 DIAGNOSIS — C50919 Malignant neoplasm of unspecified site of unspecified female breast: Secondary | ICD-10-CM | POA: Diagnosis not present

## 2019-01-30 DIAGNOSIS — R0602 Shortness of breath: Secondary | ICD-10-CM | POA: Diagnosis not present

## 2019-01-30 DIAGNOSIS — Z20828 Contact with and (suspected) exposure to other viral communicable diseases: Secondary | ICD-10-CM | POA: Diagnosis present

## 2019-01-30 DIAGNOSIS — M6281 Muscle weakness (generalized): Secondary | ICD-10-CM | POA: Diagnosis not present

## 2019-01-30 DIAGNOSIS — D701 Agranulocytosis secondary to cancer chemotherapy: Secondary | ICD-10-CM | POA: Diagnosis present

## 2019-01-30 DIAGNOSIS — R471 Dysarthria and anarthria: Secondary | ICD-10-CM | POA: Diagnosis present

## 2019-01-30 DIAGNOSIS — I110 Hypertensive heart disease with heart failure: Secondary | ICD-10-CM | POA: Diagnosis present

## 2019-01-30 DIAGNOSIS — R000 Tachycardia, unspecified: Secondary | ICD-10-CM | POA: Diagnosis not present

## 2019-01-30 DIAGNOSIS — E43 Unspecified severe protein-calorie malnutrition: Secondary | ICD-10-CM | POA: Diagnosis present

## 2019-01-30 DIAGNOSIS — K047 Periapical abscess without sinus: Secondary | ICD-10-CM | POA: Diagnosis present

## 2019-01-30 DIAGNOSIS — C7951 Secondary malignant neoplasm of bone: Secondary | ICD-10-CM | POA: Diagnosis not present

## 2019-01-30 DIAGNOSIS — E785 Hyperlipidemia, unspecified: Secondary | ICD-10-CM | POA: Diagnosis present

## 2019-01-30 DIAGNOSIS — R5382 Chronic fatigue, unspecified: Secondary | ICD-10-CM | POA: Diagnosis not present

## 2019-01-30 DIAGNOSIS — C311 Malignant neoplasm of ethmoidal sinus: Secondary | ICD-10-CM | POA: Diagnosis not present

## 2019-01-30 DIAGNOSIS — K219 Gastro-esophageal reflux disease without esophagitis: Secondary | ICD-10-CM | POA: Diagnosis present

## 2019-01-30 DIAGNOSIS — I5041 Acute combined systolic (congestive) and diastolic (congestive) heart failure: Secondary | ICD-10-CM | POA: Diagnosis present

## 2019-01-30 DIAGNOSIS — I700 Atherosclerosis of aorta: Secondary | ICD-10-CM | POA: Diagnosis not present

## 2019-01-30 DIAGNOSIS — R55 Syncope and collapse: Secondary | ICD-10-CM | POA: Diagnosis not present

## 2019-01-30 DIAGNOSIS — J99 Respiratory disorders in diseases classified elsewhere: Secondary | ICD-10-CM | POA: Diagnosis not present

## 2019-01-30 DIAGNOSIS — R5381 Other malaise: Secondary | ICD-10-CM | POA: Diagnosis not present

## 2019-01-30 DIAGNOSIS — C801 Malignant (primary) neoplasm, unspecified: Secondary | ICD-10-CM | POA: Diagnosis not present

## 2019-01-30 DIAGNOSIS — R2689 Other abnormalities of gait and mobility: Secondary | ICD-10-CM | POA: Diagnosis not present

## 2019-01-30 DIAGNOSIS — D72819 Decreased white blood cell count, unspecified: Secondary | ICD-10-CM | POA: Diagnosis not present

## 2019-01-30 DIAGNOSIS — C7971 Secondary malignant neoplasm of right adrenal gland: Secondary | ICD-10-CM | POA: Diagnosis present

## 2019-01-30 DIAGNOSIS — Z6824 Body mass index (BMI) 24.0-24.9, adult: Secondary | ICD-10-CM | POA: Diagnosis not present

## 2019-01-30 DIAGNOSIS — T451X5A Adverse effect of antineoplastic and immunosuppressive drugs, initial encounter: Secondary | ICD-10-CM | POA: Diagnosis present

## 2019-01-30 DIAGNOSIS — I69891 Dysphagia following other cerebrovascular disease: Secondary | ICD-10-CM | POA: Diagnosis not present

## 2019-01-30 DIAGNOSIS — I429 Cardiomyopathy, unspecified: Secondary | ICD-10-CM | POA: Diagnosis present

## 2019-01-30 LAB — HEMOGLOBIN A1C
Hgb A1c MFr Bld: 5.7 % — ABNORMAL HIGH (ref 4.8–5.6)
Mean Plasma Glucose: 116.89 mg/dL

## 2019-01-30 LAB — LIPID PANEL
Cholesterol: 145 mg/dL (ref 0–200)
HDL: 62 mg/dL (ref >40)
LDL Cholesterol: 70 mg/dL (ref 0–99)
Total CHOL/HDL Ratio: 2.3 RATIO
Triglycerides: 64 mg/dL (ref <150)
VLDL: 13 mg/dL (ref 0–40)

## 2019-01-30 LAB — CANCER ANTIGEN 27.29: CA 27.29: 81.7 U/mL — ABNORMAL HIGH (ref 0.0–38.6)

## 2019-01-30 LAB — ECHOCARDIOGRAM COMPLETE

## 2019-01-30 MED ORDER — PROCHLORPERAZINE EDISYLATE 10 MG/2ML IJ SOLN
10.0000 mg | Freq: Once | INTRAMUSCULAR | Status: DC
  Filled 2019-01-30: qty 2

## 2019-01-30 MED ORDER — METOCLOPRAMIDE HCL 5 MG/ML IJ SOLN
10.0000 mg | Freq: Once | INTRAMUSCULAR | Status: DC
  Filled 2019-01-30: qty 2

## 2019-01-30 MED ORDER — KETOROLAC TROMETHAMINE 30 MG/ML IJ SOLN
30.0000 mg | Freq: Once | INTRAMUSCULAR | Status: AC
  Filled 2019-01-30: qty 1

## 2019-01-30 MED ORDER — HEPARIN SODIUM (PORCINE) 5000 UNIT/ML IJ SOLN
5000.0000 [IU] | Freq: Three times a day (TID) | INTRAMUSCULAR | Status: DC
  Administered 2019-01-30 – 2019-01-31 (×2): 5000 [IU] via SUBCUTANEOUS
  Filled 2019-01-30 (×4): qty 1

## 2019-01-30 MED ORDER — GADOBUTROL 1 MMOL/ML IV SOLN
6.0000 mL | Freq: Once | INTRAVENOUS | Status: AC | PRN
  Administered 2019-01-30: 08:00:00 6 mL via INTRAVENOUS

## 2019-01-30 MED ORDER — PALBOCICLIB 75 MG PO TABS
75.0000 mg | ORAL_TABLET | Freq: Every day | ORAL | Status: DC
  Administered 2019-01-30: 75 mg via ORAL
  Filled 2019-01-30: qty 1

## 2019-01-30 NOTE — ED Notes (Signed)
Reached out to [facility] [phone]) for transport to [facility], informed will send transport

## 2019-01-30 NOTE — Progress Notes (Signed)
Patient arrived to unit via [facility].  Transferred to room bed. Patient oriented to unit and unit routines. Orders reviewed with patient and dinner tray ordered.  Patient verbalizes understanding of calling nurse to get out of bed.  Reassured patient of antibiotic administration schedule.  Placed call light and telephone within patient's reach and overbed table next to side of bed. Wendee Copp

## 2019-01-30 NOTE — ED Notes (Signed)
[facility] called for transport 

## 2019-01-30 NOTE — Progress Notes (Signed)
OT Cancellation Note  Patient Details Name: Erica Young MRN: [id number] DOB: Sep 17, 1940   Cancelled Treatment:    Reason Eval/Treat Not Completed: Other (comment).  Per notes, pt is being transferred to [facility].  Nevaeh Casillas 01/30/2019, 1:02 PM  Lesle Chris, OTR/L [facility] [phone] [facility] pager [phone] office 01/30/2019

## 2019-01-30 NOTE — ED Notes (Signed)
Son at bedside.

## 2019-01-30 NOTE — ED Notes (Signed)
Patient moved to hospital bed for comfort. Bed in lowest position and call light within reach. Will continue to monitor patient.

## 2019-01-30 NOTE — ED Notes (Signed)
Patient passed stroke swallow screen prior to administering PO medications and fluids. MD made aware. Will continue to monitor patient.

## 2019-01-30 NOTE — Progress Notes (Signed)
[facility] Progress Note   Erica Young [id number] 01-18-1941 78 y.o.  Erica Young is managed by Dr. 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 Jana Young he was currently treated with Faslodex and Ibrance.  She is scheduled to return to be seen in follow-up on 02/26/2019.  Acute weakness, dizziness, headache, and questionable left facial droop: Rapid response was called and a stroke protocol was initiated.  She was taken from the flush room for a stat head CT then assessment in the ER.  She was transported there by the rapid response team and was accompanied by the [facility] nurse.  Please see After Visit Summary for patient specific instructions.  Future Appointments  Date Time Provider Ronald  02/18/2019  8:00 AM [facility] 2 [facility] [place]  02/26/2019  8:00 AM [facility] 1 [facility] None  02/26/2019  8:30 AM Erica Young, Erica Massed, NP [facility] None  02/26/2019  9:00 AM [facility] [place] FLUSH [facility] None  03/27/2019  9:00 AM [facility] LAB 4 [facility] None  03/27/2019  9:30 AM [facility] [place] FLUSH [facility] None    No orders of the defined types were placed in this encounter.      Subjective:   Patient ID:  Erica Young is a 78 y.o. (DOB [birth year]) female.  Chief Complaint:  Chief Complaint  Patient presents with   Dizziness    HPI Erica Young  Is a 78 y.o. female with a diagnosis of stage IV right breast cancer with bone metastases.  She is followed by Dr. Jana Young he was currently treated with Faslodex and Ibrance.  She was given her Faslodex injection when she developed acute weakness, dizziness, headache, and blurred vision.  Her blood pressure was noted to be elevated at 175/94.  She also appeared to have some mild slurred speech.  Medications: I have reviewed the patient's current medications.  Allergies:  Allergies  Allergen Reactions   Ciprofloxacin Anaphylaxis    Past Medical History:  Diagnosis Date   Allergy    Anxiety    Bone cancer ([place]) mri 11/11/14   right parietal bone,left cribriform plate metastases   Cancer ([place]) 2007   right breat ca  , lumpectomy and radiation tx (declined chemo and additional prophylactic meds)   Cataract    both eyes  10/2016 Lt.     04/2017 Rt.    CEREBROVASCULAR DISEASE 03/03/2010   Diverticulitis    DIVERTICULOSIS, COLON 03/03/2010   Fatty liver 08/02/09   as per U/S done by [facility]   Foramen ovale    positive bubble study   GERD (gastroesophageal reflux disease)    Headache(784.0)    she thinks sinus headaches   History of cerebral artery stenosis    right middle   HYPERLIPIDEMIA 03/03/2010   Hypertension    HYPOTHYROIDISM 03/03/2010   no longer on meds   Internal hemorrhoid    Low back pain    Mastoiditis    noted on brain MRI   Rib fracture    Right leg pain    Shortness of breath  Stroke [facility]) Oct. 2011   TIA   Ulcer     Past Surgical History:  Procedure Laterality Date   BREAST LUMPECTOMY     right   CATARACT EXTRACTION Left    10/2016   Rt 04/2017   CHOLECYSTECTOMY     COLONOSCOPY     POLYPECTOMY     benign cecum   SHOULDER SURGERY     right shoulder (dislocation)   TONSILLECTOMY     TUBAL LIGATION     UPPER GASTROINTESTINAL ENDOSCOPY     VIDEO BRONCHOSCOPY WITH ENDOBRONCHIAL ULTRASOUND N/A 01/07/2014   Procedure: VIDEO BRONCHOSCOPY WITH ENDOBRONCHIAL ULTRASOUND;  Surgeon: Ivin Poot, MD;   Location: [place];  Service: Thoracic;  Laterality: N/A;    Family History  Problem Relation Age of Onset   Heart disease Sister        cerebral vascular disease also   Heart disease Brother        cerebral vascular disease also   Heart disease Brother        cerebral vascular disease   Heart disease Sister        cebreal vascular disease also   Heart disease Sister        cebreal vascular diease also   Heart disease Sister        cerebral vascular diease also   Stomach cancer Father    Colon cancer Neg Hx    Esophageal cancer Neg Hx    Rectal cancer Neg Hx    Colon polyps Neg Hx    Asthma Neg Hx     Social History   Socioeconomic History   Marital status: Married    Spouse name: Erica Young   Number of children: 2   Years of education: Not on file   Highest education level: Not on file  Occupational History   Occupation: Social Worker--Retired  Scientist, product/process development strain: Not on file   Food insecurity    Worry: Not on file    Inability: Not on Lexicographer needs    Medical: Not on file    Non-medical: Not on file  Tobacco Use   Smoking status: Former Smoker    Packs/day: 0.50    Years: 20.00    Pack years: 10.00    Types: Cigarettes    Quit date: 05/01/1991    Years since quitting: 27.7   Smokeless tobacco: Never Used  Substance and Sexual Activity   Alcohol use: No    Alcohol/week: 0.0 standard drinks   Drug use: No   Sexual activity: Yes  Lifestyle   Physical activity    Days per week: Not on file    Minutes per session: Not on file   Stress: Not on file  Relationships   Social connections    Talks on phone: Not on file    Gets together: Not on file    Attends religious service: Not on file    Active member of club or organization: Not on file    Attends meetings of clubs or organizations: Not on file    Relationship status: Not on file   Intimate partner violence    Fear of current or ex partner: Not  on file    Emotionally abused: Not on file    Physically abused: Not on file    Forced sexual activity: Not on file  Other Topics Concern   Not on file  Social History Narrative   Daily caffeine  Past Medical History, Surgical history, Social history, and Family history were reviewed and updated as appropriate.   Please see review of systems for further details on the patient's review from today.   Review of Systems:  Review of Systems  Constitutional: Negative for appetite change, chills, diaphoresis and fever.  HENT: Positive for voice change. Negative for dental problem, mouth sores and trouble swallowing.        Mild left facial droop  Eyes: Positive for visual disturbance.  Respiratory: Negative for cough, chest tightness and shortness of breath.   Cardiovascular: Negative for chest pain and palpitations.  Gastrointestinal: Negative for constipation, diarrhea, nausea and vomiting.  Neurological: Positive for dizziness, weakness and headaches. Negative for syncope.    Objective:   Physical Exam:  BP (!) 146/99    Pulse 75    Temp 97.6 F (36.4 C) (Oral)    Resp 18    SpO2 99% Comment: 2L [place] ECOG: 1  Physical Exam Constitutional:      Appearance: She is not toxic-appearing or diaphoretic.  HENT:     Head: Atraumatic.     Comments: Mild left facial droop Skin:    General: Skin is warm and dry.     Coloration: Skin is not jaundiced or pale.  Neurological:     Comments: Mild slurring of speech Right upper and lower extremity strength 5/5 Left upper and lower extremity strength 4/5      Lab Review:     Component Value Date/Time   NA 136 01/29/2019 1048   NA 138 04/18/2017 0853   K 4.6 01/29/2019 1048   K 4.4 04/18/2017 0853   CL 104 01/29/2019 1048   CO2 24 01/29/2019 1048   CO2 29 04/18/2017 0853   GLUCOSE 97 01/29/2019 1048   GLUCOSE 95 04/18/2017 0853   BUN 20 01/29/2019 1048   BUN 16.6 04/18/2017 0853   CREATININE 0.72 01/29/2019 1048    CREATININE 0.77 01/29/2019 0855   CREATININE 0.8 04/18/2017 0853   CALCIUM 8.8 (L) 01/29/2019 1048   CALCIUM 9.2 04/18/2017 0853   PROT 7.1 01/29/2019 1048   PROT 7.0 04/18/2017 0853   ALBUMIN 4.0 01/29/2019 1048   ALBUMIN 3.8 04/18/2017 0853   AST 28 01/29/2019 1048   AST 16 01/29/2019 0855   AST 17 04/18/2017 0853   ALT 16 01/29/2019 1048   ALT 13 01/29/2019 0855   ALT 16 04/18/2017 0853   ALKPHOS 73 01/29/2019 1048   ALKPHOS 90 04/18/2017 0853   BILITOT 0.6 01/29/2019 1048   BILITOT 0.3 01/29/2019 0855   BILITOT 0.63 04/18/2017 0853   GFRNONAA >60 01/29/2019 1048   GFRNONAA >60 01/29/2019 0855   GFRAA >60 01/29/2019 1048   GFRAA >60 01/29/2019 0855       Component Value Date/Time   WBC 2.3 (L) 01/29/2019 1048   RBC 3.36 (L) 01/29/2019 1048   HGB 12.6 01/29/2019 1048   HGB 12.2 01/29/2019 0855   HGB 13.0 04/18/2017 0853   HCT 37.8 01/29/2019 1048   HCT 38.1 04/18/2017 0853   PLT 296 01/29/2019 1048   PLT 294 01/29/2019 0855   PLT 159 04/18/2017 0853   MCV 112.5 (H) 01/29/2019 1048   MCV 105.7 (H) 04/18/2017 0853   MCH 37.5 (H) 01/29/2019 1048   MCHC 33.3 01/29/2019 1048   RDW 12.7 01/29/2019 1048   RDW 13.2 04/18/2017 0853   LYMPHSABS 0.8 01/29/2019 1048   LYMPHSABS 1.2 04/18/2017 0853   MONOABS 0.2 01/29/2019 1048   MONOABS  0.2 04/18/2017 0853   EOSABS 0.0 01/29/2019 1048   EOSABS 0.2 04/18/2017 0853   BASOSABS 0.0 01/29/2019 1048   BASOSABS 0.1 04/18/2017 0853   -------------------------------  Imaging from last 24 hours (if applicable):  Radiology interpretation: Ct Angio Head W/cm &/or Wo Cm  Result Date: 01/29/2019 CLINICAL DATA:  Left-sided weakness, slurred speech, headache. EXAM: CT ANGIOGRAPHY HEAD AND NECK TECHNIQUE: Multidetector CT imaging of the head and neck was performed using the standard protocol during bolus administration of intravenous contrast. Multiplanar CT image reconstructions and MIPs were obtained to evaluate the vascular  anatomy. Carotid stenosis measurements (when applicable) are obtained utilizing NASCET criteria, using the distal internal carotid diameter as the denominator. CONTRAST:  160mL OMNIPAQUE IOHEXOL 350 MG/ML SOLN COMPARISON:  Noncontrast head CT 11/29/2018, brain MRI 11/11/2014, MRA head neck 12/19/2009, nuclear medicine bone scan 03/17/2018 FINDINGS: CTA NECK FINDINGS Aortic arch: Common origin of the innominate and left common carotid arteries. Included portions of the aortic arch demonstrate no evidence of dissection or aneurysm. Mild atherosclerotic calcification of the visualized aortic arch. Right carotid system: CCA and ICA widely patent within the neck without significant stenosis. Left carotid system: CCA and ICA widely patent within the neck without significant stenosis. Vertebral arteries: The right vertebral artery is slightly dominant. Streak artifact from a dense left-sided contrast bolus limits evaluation of the V1 and proximal V2 left vertebral artery. Within this limitation, the bilateral vertebral arteries are patent within the neck without significant stenosis (50% or greater). Skeleton: Mild cervical spondylosis. Known right parietal calvarial metastasis. Please refer to previous nuclear medicine bone scan 03/17/2018 for description of additional osseous metastases. Other neck: No mass or enlarged lymph nodes. The thyroid gland is mildly heterogeneous but otherwise unremarkable. Upper chest: No consolidation within the imaged lung apices. Review of the MIP images confirms the above findings CTA HEAD FINDINGS Anterior circulation: Mild scattered soft and calcified plaque within the intracranial internal carotid arteries. The intracranial internal carotid arteries are patent bilaterally without significant stenosis. Right middle and anterior cerebral arteries patent without high-grade proximal stenosis. Left middle and anterior cerebral arteries patent without high-grade proximal stenosis. 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 A tiny 1 mm aneurysm arising from a right M2 branch vessel is also questioned (series 7, image 230) (series 9, image 63). Posterior circulation: Intracranial vertebral arteries are patent without high-grade stenosis. Basilar artery is patent without high-grade stenosis. Bilateral posterior cerebral arteries patent without high-grade proximal stenosis. Predominantly fetal origin of the right posterior cerebral artery. Venous sinuses: Within limitations of contrast timing, no convincing thrombus. Anatomic variants: None significant Other: Again demonstrated is a mass centered within the region of the left cribriform plate and left ethmoid sinuses. The mass measures approximately 4.1 x 3.0 x 5.0 cm (AP x TV x CC) . There is associated expansile bony remodeling with osseous erosion through the cribriform plate into the intracranial compartment. There is also lateral bowing/erosion of the left lamina papyracea. The mass extends into the left nasal passage and partly into the left maxillary sinus. Associated complete a opacification of obstructed left frontal and left sphenoid sinuses. There is also extensive partial opacification of bilateral ethmoid air cells. Significant rightward deviation of the bony nasal septum. Previously this mass measured 2.8 x 2.8 x 2.8 cm on MRI 11/11/2014. Review of the MIP images confirms the above findings IMPRESSION: CTA head: 1. No intracranial large  vessel occlusion or proximal high-grade arterial stenosis identified. 2. 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 3. Question tiny 1 mm aneurysm arising from a right M2 branch vessel, as described. 4. 4.1 x 3.0 x 5.0 cm mass centered in the region of the left cribriform plate/left ethmoid sinuses with expansile bony remodeling and osseous erosion as  described. As before, there is erosion of the cribriform plate with some extension into the intracranial compartment. Although non-specific, this mass may reflect a metastasis and has increased in size since MRI 11/11/2014. CTA neck: 1. The bilateral common and cervical internal carotid arteries are widely patent without significant stenosis 2. Streak artifact from a dense left-sided contrast bolus limits evaluation of the V1 and proximal V2 left vertebral artery. Within this limitation, the bilateral cervical vertebral arteries are patent without significant stenosis. 3. Mild atherosclerotic disease within the visualized aortic arch. Electronically Signed   By: Kellie Simmering   On: 01/29/2019 12:25   Ct Angio Neck W And/or Wo Contrast  Result Date: 01/29/2019 CLINICAL DATA:  Left-sided weakness, slurred speech, headache. EXAM: CT ANGIOGRAPHY HEAD AND NECK TECHNIQUE: Multidetector CT imaging of the head and neck was performed using the standard protocol during bolus administration of intravenous contrast. Multiplanar CT image reconstructions and MIPs were obtained to evaluate the vascular anatomy. Carotid stenosis measurements (when applicable) are obtained utilizing NASCET criteria, using the distal internal carotid diameter as the denominator. CONTRAST:  132mL OMNIPAQUE IOHEXOL 350 MG/ML SOLN COMPARISON:  Noncontrast head CT 11/29/2018, brain MRI 11/11/2014, MRA head neck 12/19/2009, nuclear medicine bone scan 03/17/2018 FINDINGS: CTA NECK FINDINGS Aortic arch: Common origin of the innominate and left common carotid arteries. Included portions of the aortic arch demonstrate no evidence of dissection or aneurysm. Mild atherosclerotic calcification of the visualized aortic arch. Right carotid system: CCA and ICA widely patent within the neck without significant stenosis. Left carotid system: CCA and ICA widely patent within the neck without significant stenosis. Vertebral arteries: The right vertebral artery is  slightly dominant. Streak artifact from a dense left-sided contrast bolus limits evaluation of the V1 and proximal V2 left vertebral artery. Within this limitation, the bilateral vertebral arteries are patent within the neck without significant stenosis (50% or greater). Skeleton: Mild cervical spondylosis. Known right parietal calvarial metastasis. Please refer to previous nuclear medicine bone scan 03/17/2018 for description of additional osseous metastases. Other neck: No mass or enlarged lymph nodes. The thyroid gland is mildly heterogeneous but otherwise unremarkable. Upper chest: No consolidation within the imaged lung apices. Review of the MIP images confirms the above findings CTA HEAD FINDINGS Anterior circulation: Mild scattered soft and calcified plaque within the intracranial internal carotid arteries. The intracranial internal carotid arteries are patent bilaterally without significant stenosis. Right middle and anterior cerebral arteries patent without high-grade proximal stenosis. Left middle and anterior cerebral arteries patent without high-grade proximal stenosis. 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 A tiny 1 mm aneurysm arising from a right M2 branch vessel is also questioned (series 7, image 230) (series 9, image 63). Posterior circulation: Intracranial vertebral arteries are patent without high-grade stenosis. Basilar artery is patent without high-grade stenosis. Bilateral posterior cerebral arteries patent without high-grade proximal stenosis. Predominantly fetal origin of the right posterior cerebral artery. Venous sinuses: Within limitations of contrast timing, no convincing thrombus. Anatomic variants: None significant Other: Again demonstrated is a  mass centered within the region of the left cribriform plate and left ethmoid sinuses. The mass measures approximately 4.1 x 3.0 x 5.0 cm (AP  x TV x CC) . There is associated expansile bony remodeling with osseous erosion through the cribriform plate into the intracranial compartment. There is also lateral bowing/erosion of the left lamina papyracea. The mass extends into the left nasal passage and partly into the left maxillary sinus. Associated complete a opacification of obstructed left frontal and left sphenoid sinuses. There is also extensive partial opacification of bilateral ethmoid air cells. Significant rightward deviation of the bony nasal septum. Previously this mass measured 2.8 x 2.8 x 2.8 cm on MRI 11/11/2014. Review of the MIP images confirms the above findings IMPRESSION: CTA head: 1. No intracranial large vessel occlusion or proximal high-grade arterial stenosis identified. 2. 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 3. Question tiny 1 mm aneurysm arising from a right M2 branch vessel, as described. 4. 4.1 x 3.0 x 5.0 cm mass centered in the region of the left cribriform plate/left ethmoid sinuses with expansile bony remodeling and osseous erosion as described. As before, there is erosion of the cribriform plate with some extension into the intracranial compartment. Although non-specific, this mass may reflect a metastasis and has increased in size since MRI 11/11/2014. CTA neck: 1. The bilateral common and cervical internal carotid arteries are widely patent without significant stenosis 2. Streak artifact from a dense left-sided contrast bolus limits evaluation of the V1 and proximal V2 left vertebral artery. Within this limitation, the bilateral cervical vertebral arteries are patent without significant stenosis. 3. Mild atherosclerotic disease within the visualized aortic arch. Electronically Signed   By: Kellie Simmering   On: 01/29/2019 12:25   Mr Brain Wo Contrast  Result Date: 01/29/2019 CLINICAL DATA:  78 year old female with code stroke presentation. Sudden onset left side  weakness at 0955 hours. Slurred speech and headache. 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 EXAM: MRI HEAD WITHOUT CONTRAST TECHNIQUE: Multiplanar, multiecho pulse sequences of the brain and surrounding structures were obtained without intravenous contrast. COMPARISON:  CT head, CTA head and neck earlier today. Brain MRI 11/01/2014. FINDINGS: Brain: No restricted diffusion to suggest acute infarction. No midline shift, mass effect, evidence of mass lesion, ventriculomegaly, extra-axial collection or acute intracranial hemorrhage. Cervicomedullary junction and pituitary are within normal limits. Mild for age scattered subcortical cerebral white matter T2 and FLAIR hyperintensity in both hemispheres (series 6, image 18) has mildly increased since 2016. There could be a subtle area of right superior sensory strip encephalomalacia on image 21 which is chronic. No evidence of vasogenic edema. No other cortical encephalomalacia. No chronic cerebral blood products. The deep gray nuclei and brainstem are negative. The deep gray nuclei, brainstem and cerebellum are negative; probable small crossing right cerebellar vessel rather than tiny chronic lacune on series 8, image 31. Vascular: Major intracranial vascular flow voids are stable since 2016. Skull and upper cervical spine: Regressed right posterior skull metastasis since 2016 on series 6, image 14. heterogeneous bone marrow signal elsewhere with no destructive osseous lesion identified. Negative visible cervical spine. Sinuses/Orbits: Large and chronic but substantially increased heterogeneously T2 hyperintense mass centered in the left nasal cavity now estimated up to 47 millimeters diameter (29 millimeters in 2016). Subsequent left side paranasal sinus obstruction with diffuse retained secretions and mucosal thickening. Interestingly, the mass demonstrates facilitated diffusion (series 901, image 36). No contrast administered today. Previously  the mass was homogeneously enhancing. The right ethmoid air cells are also obstructed and opacified, but the remaining right paranasal sinuses are well pneumatized. The bilateral orbits soft tissues remain normal. Other: Grossly negative visible internal auditory structures. Chronic right mastoid effusion is stable. Left mastoids remain clear. Negative scalp soft tissues. IMPRESSION: 1. Negative for acute infarct, and no significant change in the noncontrast MRI appearance of the brain since 2016. 2. 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 Subsequent paranasal sinus obstruction and left-side sinus mucocele formation. 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 Follow-up with ENT recommended. 3. Regressed right parietal bone metastasis since 2016. Electronically Signed   By: Genevie Ann M.D.   On: 01/29/2019 22:47   Mr Brain W Contrast  Result Date: 01/30/2019 CLINICAL DATA:  Follow-up abnormal brain MRI EXAM: MRI HEAD WITH CONTRAST TECHNIQUE: Multiplanar, multiecho pulse sequences of the brain and surrounding structures were obtained with intravenous contrast. CONTRAST:  107mL GADAVIST GADOBUTROL 1 MMOL/ML IV SOLN COMPARISON:  Brain MRI from yesterday FINDINGS: Brain: No evidence of hematogenous metastasis to the brain. Vascular: Major vessels are enhancing Skull and upper cervical spine: Decreased enhancement and expansion of right parietal bone metastasis seen on comparison. There is only minimal enhancement today and no dural distortion. Sinuses/Orbits: Large mass centered in the left nasal cavity with cribriform plate extension and dural distortion. Mass measures up to 5 cm craniocaudal and obstructs left-sided sinuses. There is extensive bony erosion at the level of the mass, which has  significantly enlarged since 2016. No visible brain invasion. IMPRESSION: 1. 5 cm left nasal cavity mass extending to the cribriform plate and causing postobstructive sinusitis. Given metastatic disease elsewhere has responded in this lesion has enlarged, favor a primary nasal mass rather than metastatic disease. 2. No worrisome findings at the regressed right parietal bone metastasis. 3. No evidence of metastasis to the brain. Electronically Signed   By: Monte Fantasia M.D.   On: 01/30/2019 08:13   Ct Head Code Stroke Wo Contrast  Result Date: 01/29/2019 CLINICAL DATA:  Code stroke. Cerebral hemorrhage suspected, possible stroke. Additional history provided: Patient at [facility] with sudden left-sided weakness at 09:55. Slurred speech. EXAM: CT HEAD WITHOUT CONTRAST TECHNIQUE: Contiguous axial images were obtained from the base of the skull through the vertex without intravenous contrast. COMPARISON:  Report from head CT 04/11/2015 (images unavailable), brain MRI 7146 FINDINGS: Brain: No evidence of acute intracranial hemorrhage. No demarcated cortical infarction. No midline shift or extra-axial fluid collection. Mild generalized parenchymal atrophy. Partially empty sella turcica. Vascular: No definite hyperdense vessel. Skull: No calvarial fracture. Irregular lucency within the right parietal bone consistent with known right parietal bone metastasis. Sinuses/Orbits: Incompletely imaged partially calcified mass centered in the region of the left cribriform plate and left ethmoid sinuses. As before, there is erosion through the cribriform plate with intracranial extension. Associated bony expansion and remodeling. This includes redemonstrated lateral bowing/erosion of the left lamina papyracea. The mass extends partly into the left nasal passage and left maxillary sinus. Associated complete opacification of the left frontal and sphenoid sinuses. The mass measures 3.8 x 3.1 cm (AP x TV) and at least 3.3 cm  in craniocaudal dimension (series 2, image 1) (series 6, image 27). On prior brain MRI 11/11/2014, this mass measured 2.8  x 2.8 x 2.8 cm ASPECTS [facility] Stroke Program Early CT Score) - Ganglionic level infarction (caudate, lentiform nuclei, internal capsule, insula, M1-M3 cortex): 7 - Supraganglionic infarction (M4-M6 cortex): 3 Total score (0-10 with 10 being normal): 10 These results were called by telephone at the time of interpretation on 01/29/2019 at 10:53 am to provider [facility] , who verbally acknowledged these results. IMPRESSION: 1. No evidence of intracranial hemorrhage or acute demarcated cortical infarction 2. Incompletely imaged partially calcified mass centered in the region of the left cribriform plate and left ethmoid sinuses. As before, there is erosion through the cribriform plate with intracranial extension. The mass measures 3.8 x 3.1 cm (AP x TV) and at least 3.3 cm in craniocaudal dimension (previously 2.8 x 2.8 x 2.8 cm on MRI 11/11/2014). Complete opacification of obstructed left frontal and left sphenoid sinuses. 3. Known right parietal bone metastasis. Electronically Signed   By: Kellie Simmering   On: 01/29/2019 11:06        This patient was seen with Dr. Jana Young with my treatment plan reviewed with him. He expressed agreement with my medical management of this patient.   ADDENDUM: It is not clear exactly what happened with [facility].  I do not think the Faslodex is really the cause of the problem.  She certainly has been under tremendous stress recently because of her husband's concurrent severe medical problems and her being the primary caregiver for him.  Nevertheless on exam she clearly has a partial left fifth paresis and some left body weakness.  She needs to be evaluated for possible stroke.  This is being arranged.  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  Certainly I would favor any intervention that would help her overcome her current symptoms.  I personally saw this patient and performed a substantive portion of this encounter with the listed APP documented above.   Chauncey Cruel, MD Medical Oncology and Hematology [facility] [address] [place], [place] [address] Tel. [phone]    Fax. [phone]

## 2019-01-30 NOTE — ED Notes (Signed)
SpO2 88% on RA. Applied 2 L of O2 via [place]. Will continue to monitor patient.

## 2019-01-30 NOTE — ED Notes (Signed)
This nurse reached out to [facility] r/t transport to [facility]- informed no available trucks at this time, no ETA at this time.

## 2019-01-30 NOTE — Progress Notes (Signed)
Echocardiogram 2D Echocardiogram has been performed.  Oneal Deputy Grae Cannata 01/30/2019, 8:51 AM

## 2019-01-30 NOTE — ED Notes (Signed)
Patient provided apple sauce to eat prior to taking her medication. Patient refused to take medication on an empty stomach. Will continue to monitor.

## 2019-01-30 NOTE — Consult Note (Addendum)
Requesting Physician: Dr. Fayette Pho. Ralene Bathe    Chief Complaint: Left-sided weakness  History obtained from: Patient and Chart     HPI:                                                                                                                                       Erica Young is a 78 y.o. female with past medical history significant for metastatic breast cancer, anxiety, patent foramen ovale, hypertension, hyperlipidemia, chronic headaches presents to [facility] long emergency department with left side weakness and facial droop.  According to the patient she was receiving 2 injections at her physician's office on 10/1.  She received the first injection and was fine but before she received a second injection she developed a sour taste in her mouth and felt it go throught the left side of her face. She then passed out for about 2 min and when she woke up she was noted to have mild left facial droop and left side weakness. LKN was 9.55 am.  ED course Tele neurology was consulted in the ER due to concern for acute stroke. Her NIHSS was 4 for L nasolabial fold asymmetry, LUE and LLE drift, and mild dysarthria.  CT negative for hemorrhage however did show a large left nasal cavity mass extending to the cribriform plate as well as right parietal bone metastasis had improved compared to prior CT.  tPA was not administered due to mild non disabling symptoms and large mass in the left nasal cavity extending to the cribiform plate. Patient admitted with plan for stroke workup vs seizure/post ictal weakness. Neurology consulted for further recommendations.   Patient has remained in [facility] due to lack of available beds at [facility]. Evaluated patient in [facility] this morning, patient states her headache has resolved. She continues to feel generally weak and has left arm drift and reduced grip strength and mild NL fold flattening.   She does have history of headaches for several years.  She states that they are usually frontal  but it can occur occipital.  Usually occur on the left side.  Does have photosensitivity but denies nausea.  Denies any visual aura or olfactory aura.   Past Medical History:  Diagnosis Date  . Allergy   . Anxiety   . 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 Cancer [facility]) 2007   right breat ca  , lumpectomy and radiation tx (declined chemo and additional prophylactic meds)  . Cataract    both eyes  10/2016 Lt.     04/2017 Rt.   . CEREBROVASCULAR DISEASE 03/03/2010  . Diverticulitis   . DIVERTICULOSIS, COLON 03/03/2010  . Fatty liver 08/02/09   as per U/S done by [facility]  . Foramen ovale    positive bubble study  . GERD (gastroesophageal reflux disease)   . Headache(784.0)    she thinks sinus  headaches  . History of cerebral artery stenosis    right middle  . HYPERLIPIDEMIA 03/03/2010  . Hypertension   . HYPOTHYROIDISM 03/03/2010   no longer on meds  . Internal hemorrhoid   . Low back pain   . Mastoiditis    noted on brain MRI  . Rib fracture   . Right leg pain   . Shortness of breath   . Stroke [facility]) Oct. 2011   TIA  . Ulcer     Past Surgical History:  Procedure Laterality Date  . BREAST LUMPECTOMY     right  . CATARACT EXTRACTION Left    10/2016   Rt 04/2017  . CHOLECYSTECTOMY    . COLONOSCOPY    . POLYPECTOMY     benign cecum  . SHOULDER SURGERY     right shoulder (dislocation)  . TONSILLECTOMY    . TUBAL LIGATION    . UPPER GASTROINTESTINAL ENDOSCOPY    . VIDEO BRONCHOSCOPY WITH ENDOBRONCHIAL ULTRASOUND N/A 01/07/2014   Procedure: VIDEO BRONCHOSCOPY WITH ENDOBRONCHIAL ULTRASOUND;  Surgeon: Ivin Poot, MD;  Location: [facility] OR;  Service: Thoracic;  Laterality: N/A;    Family History  Problem Relation Age of Onset  . Heart disease Sister        cerebral vascular disease also  . Heart disease Brother        cerebral vascular disease also  . Heart disease Brother        cerebral vascular disease  .  Heart disease Sister        cebreal vascular disease also  . Heart disease Sister        cebreal vascular diease also  . Heart disease Sister        cerebral vascular diease also  . Stomach cancer Father   . Colon cancer Neg Hx   . Esophageal cancer Neg Hx   . Rectal cancer Neg Hx   . Colon polyps Neg Hx   . Asthma Neg Hx    Social History:  reports that she quit smoking about 27 years ago. Her smoking use included cigarettes. She has a 10.00 pack-year smoking history. She has never used smokeless tobacco. She reports that she does not drink alcohol or use drugs.  Allergies:  Allergies  Allergen Reactions  . Ciprofloxacin Anaphylaxis    Medications:                                                                                                                        I reviewed home medications   ROS:  14 systems reviewed and negative except above    Examination:                                                                                                      General: Appears well-developed and well-nourished.  Psych: Affect appropriate to situation Eyes: No scleral injection HENT: No OP obstrucion Head: Normocephalic.  Cardiovascular: Normal rate and regular rhythm.  Respiratory: Effort normal and breath sounds normal to anterior ascultation GI: Soft.  No distension. There is no tenderness.  Skin: WDI    Neurological Examination Mental Status: Alert but somnolent, oriented, thought content appropriate.  Speech fluent without evidence of aphasia. Mild dysarthria.  Able to follow 3 step commands without difficulty. Cranial Nerves: II: Visual fields grossly normal,  III,IV, VI: ptosis not present, extra-ocular motions intact bilaterally, pupils equal, round, reactive to light and accommodation V,VII: Mild nasolabial fold flattening, facial  light touch sensation normal bilaterally VIII: hearing normal bilaterally IX,X: uvula rises symmetrically XI: bilateral shoulder shrug XII: midline tongue extension Motor: Right : Upper extremity   4+/5    Left:     Upper extremity   4/5  Lower extremity   4/5     Lower extremity   4/5 Tone and bulk:normal tone throughout; no atrophy noted Sensory: Pinprick and light touch intact throughout, bilaterally Plantars: Right: downgoing   Left: downgoing Cerebellar: normal finger-to-nose    Lab Results: Basic Metabolic Panel: Recent Labs  Lab 01/23/19 1516 01/29/19 0855 01/29/19 1047 01/29/19 1048  NA 140 139 138 136  K 4.2 4.6 4.1 4.6  CL 103 104 106 104  CO2 25 28  --  24  GLUCOSE 102* 102* 90 97  BUN 15 20 19 20   CREATININE 0.73 0.77 0.60 0.72  CALCIUM 9.1 9.1  --  8.8*    CBC: Recent Labs  Lab 01/23/19 1516 01/29/19 0855 01/29/19 1047 01/29/19 1048  WBC 3.4* 2.3*  --  2.3*  NEUTROABS 2.0 1.3*  --  1.3*  HGB 13.2 12.2 11.9* 12.6  HCT 38.3 36.0 35.0* 37.8  MCV 112.3* 112.5*  --  112.5*  PLT 187 294  --  296    Coagulation Studies: Recent Labs    01/29/19 1048  LABPROT 13.6  INR 1.1    Imaging: Ct Angio Head W/cm &/or Wo Cm  Result Date: 01/29/2019 CLINICAL DATA:  Left-sided weakness, slurred speech, headache. EXAM: CT ANGIOGRAPHY HEAD AND NECK TECHNIQUE: Multidetector CT imaging of the head and neck was performed using the standard protocol during bolus administration of intravenous contrast. Multiplanar CT image reconstructions and MIPs were obtained to evaluate the vascular anatomy. Carotid stenosis measurements (when applicable) are obtained utilizing NASCET criteria, using the distal internal carotid diameter as the denominator. CONTRAST:  187mL OMNIPAQUE IOHEXOL 350 MG/ML SOLN COMPARISON:  Noncontrast head CT 11/29/2018, brain MRI 11/11/2014, MRA head neck 12/19/2009, nuclear medicine bone scan 03/17/2018 FINDINGS: CTA NECK FINDINGS Aortic arch: Common  origin of the innominate and left common carotid arteries. Included portions of the aortic arch demonstrate no evidence of dissection or aneurysm. Mild atherosclerotic  calcification of the visualized aortic arch. Right carotid system: CCA and ICA widely patent within the neck without significant stenosis. Left carotid system: CCA and ICA widely patent within the neck without significant stenosis. Vertebral arteries: The right vertebral artery is slightly dominant. Streak artifact from a dense left-sided contrast bolus limits evaluation of the V1 and proximal V2 left vertebral artery. Within this limitation, the bilateral vertebral arteries are patent within the neck without significant stenosis (50% or greater). Skeleton: Mild cervical spondylosis. Known right parietal calvarial metastasis. Please refer to previous nuclear medicine bone scan 03/17/2018 for description of additional osseous metastases. Other neck: No mass or enlarged lymph nodes. The thyroid gland is mildly heterogeneous but otherwise unremarkable. Upper chest: No consolidation within the imaged lung apices. Review of the MIP images confirms the above findings CTA HEAD FINDINGS Anterior circulation: Mild scattered soft and calcified plaque within the intracranial internal carotid arteries. The intracranial internal carotid arteries are patent bilaterally without significant stenosis. Right middle and anterior cerebral arteries patent without high-grade proximal stenosis. Left middle and anterior cerebral arteries patent without high-grade proximal stenosis. 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 A tiny 1 mm aneurysm arising from a right M2 branch vessel is also questioned (series 7, image 230) (series 9, image 63). Posterior circulation: Intracranial vertebral arteries are patent without high-grade stenosis. Basilar artery is patent without  high-grade stenosis. Bilateral posterior cerebral arteries patent without high-grade proximal stenosis. Predominantly fetal origin of the right posterior cerebral artery. Venous sinuses: Within limitations of contrast timing, no convincing thrombus. Anatomic variants: None significant Other: Again demonstrated is a mass centered within the region of the left cribriform plate and left ethmoid sinuses. The mass measures approximately 4.1 x 3.0 x 5.0 cm (AP x TV x CC) . There is associated expansile bony remodeling with osseous erosion through the cribriform plate into the intracranial compartment. There is also lateral bowing/erosion of the left lamina papyracea. The mass extends into the left nasal passage and partly into the left maxillary sinus. Associated complete a opacification of obstructed left frontal and left sphenoid sinuses. There is also extensive partial opacification of bilateral ethmoid air cells. Significant rightward deviation of the bony nasal septum. Previously this mass measured 2.8 x 2.8 x 2.8 cm on MRI 11/11/2014. Review of the MIP images confirms the above findings IMPRESSION: CTA head: 1. No intracranial large vessel occlusion or proximal high-grade arterial stenosis identified. 2. 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 3. Question tiny 1 mm aneurysm arising from a right M2 branch vessel, as described. 4. 4.1 x 3.0 x 5.0 cm mass centered in the region of the left cribriform plate/left ethmoid sinuses with expansile bony remodeling and osseous erosion as described. As before, there is erosion of the cribriform plate with some extension into the intracranial compartment. Although non-specific, this mass may reflect a metastasis and has increased in size since MRI 11/11/2014. CTA neck: 1. The bilateral common and cervical internal carotid arteries are widely patent without significant stenosis 2. Streak artifact from a dense left-sided  contrast bolus limits evaluation of the V1 and proximal V2 left vertebral artery. Within this limitation, the bilateral cervical vertebral arteries are patent without significant stenosis. 3. Mild atherosclerotic disease within the visualized aortic arch. Electronically Signed   By: Kellie Simmering   On: 01/29/2019 12:25   Ct Angio Neck  W And/or Wo Contrast  Result Date: 01/29/2019 CLINICAL DATA:  Left-sided weakness, slurred speech, headache. EXAM: CT ANGIOGRAPHY HEAD AND NECK TECHNIQUE: Multidetector CT imaging of the head and neck was performed using the standard protocol during bolus administration of intravenous contrast. Multiplanar CT image reconstructions and MIPs were obtained to evaluate the vascular anatomy. Carotid stenosis measurements (when applicable) are obtained utilizing NASCET criteria, using the distal internal carotid diameter as the denominator. CONTRAST:  184mL OMNIPAQUE IOHEXOL 350 MG/ML SOLN COMPARISON:  Noncontrast head CT 11/29/2018, brain MRI 11/11/2014, MRA head neck 12/19/2009, nuclear medicine bone scan 03/17/2018 FINDINGS: CTA NECK FINDINGS Aortic arch: Common origin of the innominate and left common carotid arteries. Included portions of the aortic arch demonstrate no evidence of dissection or aneurysm. Mild atherosclerotic calcification of the visualized aortic arch. Right carotid system: CCA and ICA widely patent within the neck without significant stenosis. Left carotid system: CCA and ICA widely patent within the neck without significant stenosis. Vertebral arteries: The right vertebral artery is slightly dominant. Streak artifact from a dense left-sided contrast bolus limits evaluation of the V1 and proximal V2 left vertebral artery. Within this limitation, the bilateral vertebral arteries are patent within the neck without significant stenosis (50% or greater). Skeleton: Mild cervical spondylosis. Known right parietal calvarial metastasis. Please refer to previous nuclear  medicine bone scan 03/17/2018 for description of additional osseous metastases. Other neck: No mass or enlarged lymph nodes. The thyroid gland is mildly heterogeneous but otherwise unremarkable. Upper chest: No consolidation within the imaged lung apices. Review of the MIP images confirms the above findings CTA HEAD FINDINGS Anterior circulation: Mild scattered soft and calcified plaque within the intracranial internal carotid arteries. The intracranial internal carotid arteries are patent bilaterally without significant stenosis. Right middle and anterior cerebral arteries patent without high-grade proximal stenosis. Left middle and anterior cerebral arteries patent without high-grade proximal stenosis. 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 A tiny 1 mm aneurysm arising from a right M2 branch vessel is also questioned (series 7, image 230) (series 9, image 63). Posterior circulation: Intracranial vertebral arteries are patent without high-grade stenosis. Basilar artery is patent without high-grade stenosis. Bilateral posterior cerebral arteries patent without high-grade proximal stenosis. Predominantly fetal origin of the right posterior cerebral artery. Venous sinuses: Within limitations of contrast timing, no convincing thrombus. Anatomic variants: None significant Other: Again demonstrated is a mass centered within the region of the left cribriform plate and left ethmoid sinuses. The mass measures approximately 4.1 x 3.0 x 5.0 cm (AP x TV x CC) . There is associated expansile bony remodeling with osseous erosion through the cribriform plate into the intracranial compartment. There is also lateral bowing/erosion of the left lamina papyracea. The mass extends into the left nasal passage and partly into the left maxillary sinus. Associated complete a opacification of obstructed left frontal and left sphenoid  sinuses. There is also extensive partial opacification of bilateral ethmoid air cells. Significant rightward deviation of the bony nasal septum. Previously this mass measured 2.8 x 2.8 x 2.8 cm on MRI 11/11/2014. Review of the MIP images confirms the above findings IMPRESSION: CTA head: 1. No intracranial large vessel occlusion or proximal high-grade arterial stenosis identified. 2. 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 3. Question tiny 1 mm aneurysm arising from a right M2 branch vessel, as described. 4. 4.1 x 3.0 x  5.0 cm mass centered in the region of the left cribriform plate/left ethmoid sinuses with expansile bony remodeling and osseous erosion as described. As before, there is erosion of the cribriform plate with some extension into the intracranial compartment. Although non-specific, this mass may reflect a metastasis and has increased in size since MRI 11/11/2014. CTA neck: 1. The bilateral common and cervical internal carotid arteries are widely patent without significant stenosis 2. Streak artifact from a dense left-sided contrast bolus limits evaluation of the V1 and proximal V2 left vertebral artery. Within this limitation, the bilateral cervical vertebral arteries are patent without significant stenosis. 3. Mild atherosclerotic disease within the visualized aortic arch. Electronically Signed   By: Kellie Simmering   On: 01/29/2019 12:25   Mr Brain Wo Contrast  Result Date: 01/29/2019 CLINICAL DATA:  78 year old female with code stroke presentation. Sudden onset left side weakness at 0955 hours. Slurred speech and headache. 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 EXAM: MRI HEAD WITHOUT CONTRAST TECHNIQUE: Multiplanar, multiecho pulse sequences of the brain and surrounding structures were obtained without intravenous contrast. COMPARISON:  CT head, CTA head and neck earlier today. Brain MRI 11/01/2014. FINDINGS: Brain: No  restricted diffusion to suggest acute infarction. No midline shift, mass effect, evidence of mass lesion, ventriculomegaly, extra-axial collection or acute intracranial hemorrhage. Cervicomedullary junction and pituitary are within normal limits. Mild for age scattered subcortical cerebral white matter T2 and FLAIR hyperintensity in both hemispheres (series 6, image 18) has mildly increased since 2016. There could be a subtle area of right superior sensory strip encephalomalacia on image 21 which is chronic. No evidence of vasogenic edema. No other cortical encephalomalacia. No chronic cerebral blood products. The deep gray nuclei and brainstem are negative. The deep gray nuclei, brainstem and cerebellum are negative; probable small crossing right cerebellar vessel rather than tiny chronic lacune on series 8, image 31. Vascular: Major intracranial vascular flow voids are stable since 2016. Skull and upper cervical spine: Regressed right posterior skull metastasis since 2016 on series 6, image 14. heterogeneous bone marrow signal elsewhere with no destructive osseous lesion identified. Negative visible cervical spine. Sinuses/Orbits: Large and chronic but substantially increased heterogeneously T2 hyperintense mass centered in the left nasal cavity now estimated up to 47 millimeters diameter (29 millimeters in 2016). Subsequent left side paranasal sinus obstruction with diffuse retained secretions and mucosal thickening. Interestingly, the mass demonstrates facilitated diffusion (series 901, image 36). No contrast administered today. Previously the mass was homogeneously enhancing. The right ethmoid air cells are also obstructed and opacified, but the remaining right paranasal sinuses are well pneumatized. The bilateral orbits soft tissues remain normal. Other: Grossly negative visible internal auditory structures. Chronic right mastoid effusion is stable. Left mastoids remain clear. Negative scalp soft tissues.  IMPRESSION: 1. Negative for acute infarct, and no significant change in the noncontrast MRI appearance of the brain since 2016. 2. 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 Subsequent paranasal sinus obstruction and left-side sinus mucocele formation. 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 Follow-up with ENT recommended. 3. Regressed right parietal bone metastasis since 2016. Electronically Signed   By: Genevie Ann M.D.   On: 01/29/2019 22:47   Mr Brain W Contrast  Result Date: 01/30/2019 CLINICAL DATA:  Follow-up abnormal brain MRI EXAM:  MRI HEAD WITH CONTRAST TECHNIQUE: Multiplanar, multiecho pulse sequences of the brain and surrounding structures were obtained with intravenous contrast. CONTRAST:  101mL GADAVIST GADOBUTROL 1 MMOL/ML IV SOLN COMPARISON:  Brain MRI from yesterday FINDINGS: Brain: No evidence of hematogenous metastasis to the brain. Vascular: Major vessels are enhancing Skull and upper cervical spine: Decreased enhancement and expansion of right parietal bone metastasis seen on comparison. There is only minimal enhancement today and no dural distortion. Sinuses/Orbits: Large mass centered in the left nasal cavity with cribriform plate extension and dural distortion. Mass measures up to 5 cm craniocaudal and obstructs left-sided sinuses. There is extensive bony erosion at the level of the mass, which has significantly enlarged since 2016. No visible brain invasion. IMPRESSION: 1. 5 cm left nasal cavity mass extending to the cribriform plate and causing postobstructive sinusitis. Given metastatic disease elsewhere has responded in this lesion has enlarged, favor a primary nasal mass rather than metastatic disease. 2. No worrisome findings at the regressed right parietal bone  metastasis. 3. No evidence of metastasis to the brain. Electronically Signed   By: Monte Fantasia M.D.   On: 01/30/2019 08:13   Ct Head Code Stroke Wo Contrast  Result Date: 01/29/2019 CLINICAL DATA:  Code stroke. Cerebral hemorrhage suspected, possible stroke. Additional history provided: Patient at [facility] with sudden left-sided weakness at 09:55. Slurred speech. EXAM: CT HEAD WITHOUT CONTRAST TECHNIQUE: Contiguous axial images were obtained from the base of the skull through the vertex without intravenous contrast. COMPARISON:  Report from head CT 04/11/2015 (images unavailable), brain MRI [id number] FINDINGS: Brain: No evidence of acute intracranial hemorrhage. No demarcated cortical infarction. No midline shift or extra-axial fluid collection. Mild generalized parenchymal atrophy. Partially empty sella turcica. Vascular: No definite hyperdense vessel. Skull: No calvarial fracture. Irregular lucency within the right parietal bone consistent with known right parietal bone metastasis. Sinuses/Orbits: Incompletely imaged partially calcified mass centered in the region of the left cribriform plate and left ethmoid sinuses. As before, there is erosion through the cribriform plate with intracranial extension. Associated bony expansion and remodeling. This includes redemonstrated lateral bowing/erosion of the left lamina papyracea. The mass extends partly into the left nasal passage and left maxillary sinus. Associated complete opacification of the left frontal and sphenoid sinuses. The mass measures 3.8 x 3.1 cm (AP x TV) and at least 3.3 cm in craniocaudal dimension (series 2, image 1) (series 6, image 27). On prior brain MRI 11/11/2014, this mass measured 2.8 x 2.8 x 2.8 cm ASPECTS [facility] Stroke Program Early CT Score) - Ganglionic level infarction (caudate, lentiform nuclei, internal capsule, insula, M1-M3 cortex): 7 - Supraganglionic infarction (M4-M6 cortex): 3 Total score (0-10 with 10 being normal): 10  These results were called by telephone at the time of interpretation on 01/29/2019 at 10:53 am to provider [facility] , who verbally acknowledged these results. IMPRESSION: 1. No evidence of intracranial hemorrhage or acute demarcated cortical infarction 2. Incompletely imaged partially calcified mass centered in the region of the left cribriform plate and left ethmoid sinuses. As before, there is erosion through the cribriform plate with intracranial extension. The mass measures 3.8 x 3.1 cm (AP x TV) and at least 3.3 cm in craniocaudal dimension (previously 2.8 x 2.8 x 2.8 cm on MRI 11/11/2014). Complete opacification of obstructed left frontal and left sphenoid sinuses. 3. Known right parietal bone metastasis. Electronically Signed   By: Kellie Simmering   On: 01/29/2019 11:06     I have reviewed the above imaging  ASSESSMENT AND PLAN  78 y.o. female with past medical history significant for metastatic breast cancer, anxiety, patent foramen ovale, hypertension, hyperlipidemia, chronic headaches presents to [facility] long emergency department with left side weakness and facial droop. Code stroke activated, patient did not receive tPA due to nasal mass extending to cribriform plate.   On assessment this morning patient continues to have mild NLFF and left side >right side weakness.  No clear pronator drift, difficult to assess if this is due to reduced effort, however NLFF does appear to present and not volitional. MRI brain negative for acute infarct and so unlikely presentation either TIA/stroke although include MRI negative stroke a small possibility.  MRI Brain was performed which redemonstrated the nasal cavity mass, no meningeal enhancement or parenchymal brain lesion/brain metastasis.  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  The differential to be considered is partial  seizure with Todd's paresis and postictal headache.  Unusual this happened immediately after her receiving injection.  Complex migraine also on the differential.  Given patient has chronic headaches, and symptoms associated with headache.  Impression Left side weakness Syncopal episode  D/D includes migraine, focal seizure with post ictal Todd's paresis vs MRI negative infarction  Recommendations MRI brain with and without contrast: Performed and stated above CT head and neck: Negative for flow-limiting stenosis extracranial and intracranially Routine EEG Migraine cocktail to look for improvement of symptoms ENT consult for nasal cavity lesion  HbAIC and Lipid profile, Echo  PT OT evaluation  Neurology will continue to follow.  Sushanth Aroor [facility] Pager Number [phone]

## 2019-01-30 NOTE — Procedures (Signed)
Patient Name: Kariya Lavergne  MRN: [id number]  Epilepsy Attending: Lora Havens  Referring Physician/Provider: Dr Remi Haggard Aroor Date: 01/30/2019 Duration: 23.65mins  Patient history: 78yo F with left sided weakness.   Level of alertness: awake, asleep  AEDs during EEG study: alprazolam  Technical aspects: This EEG study was done with scalp electrodes positioned according to the 10-20 International system of electrode placement. Electrical activity was acquired at a sampling rate of 500Hz  and reviewed with a high frequency filter of 70Hz  and a low frequency filter of 1Hz . EEG data were recorded continuously and digitally stored.   DESCRIPTION: The posterior dominant rhythm consists of 8-9 Hz activity of moderate voltage (25-35 uV) seen predominantly in posterior head regions, symmetric and reactive to eye opening and eye closing. Sleep was characterized by sleep spindles (12-14hz ), maximal frontocentral.  There is an excessive amount of 15 to 18 Hz, 2-3 uV beta activity with irregular morphology distributed symmetrically and diffusely.  Hyperventilation and photic stimulation were not performed.  ABNORMALITY: -1 Excessive fast, generalzied  IMPRESSION: This study is within normal limits. No seizures or definite epileptiform discharges were seen throughout the recording.  The excessive beta activity seen in the background is most likely due to the effect of benzodiazepine and is a benign EEG pattern.  Anays Detore Barbra Sarks

## 2019-01-30 NOTE — ED Notes (Signed)
Called [facility] again and they said it will be much later before transporting this pt.

## 2019-01-30 NOTE — Progress Notes (Signed)
PT Cancellation Note  Patient Details Name: Sashay Felling MRN: [id number] DOB: [birth year]   Cancelled Treatment:    Reason Eval/Treat Not Completed: Medical issues which prohibited therapy, transferring to [facility].   Claretha Cooper 01/30/2019, 12:44 PM

## 2019-01-30 NOTE — Progress Notes (Unsigned)
COURTESY NOTE:  Appreciate your evaluation of this 78 y/o [name] I follow for stage IV breast cancer, who developed left facial and body weakness yesterday after a syncopal episode in the clinic. Her breast cancer history is summarized below as of the time I picked up her case 2 years ago.   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 It may be worthwhile biopsying this mass at some point as suggested in the MRI report.   Erica Young's cancer is otherwise generally stable and she is tolerating her anti-estrogen and anti-cyclin-D oral medications well.  I am not in town this weekend. Please let us know if we can be of further help  GM        78 y.o. [name]  (1) status post right breast upper outer quadrant lumpectomy and axillary lymph node dissection in 2007 for what appears to have been a T1 N0, stage IA invasive ductal carcinoma, treated adjuvantly with radiation (33 sessions)  METASTATIC DISEASE definitively documented Sept 2015 (2) bronchoscopic biopsy 01/07/2014 showed a low-grade mucinous breast cancer, strongly estrogen receptor positive, progesterone receptor positive, HER-2 negative; staging studies confirmed extensive hypermetabolic adenopathy, multiple bone lesions, likely early lung involvement, but no liver lesions.             (a) bone scan and chest CT 07/09/2016 shows stable disease.             (b) CA-27-29 is moderately informative             (c) bone scan and CT scan of the chest 04/24/2017 shows essentially stable disease  (3) on Arimidex between September 2015 and June 2017, with evidence of response; discontinued secondary to side effects             (a) bone density 04/10/2016 shows osteopenia with a T score of -2.1  (4) on monthly denosumab/Xgeva between 07/30/2014 and 10/04/2015, discontinued due to patient's concerns regarding osteonecrosis of the jaw         (a) discussed again October 2017, the patient adamantly refusing denosumab  (5) started fulvestrant 01/26/2016                  (a) Palbociclib added at 75 mg M/W/F, beginning mid February 2018             (b) chest CT scan obtained 12/02/2017 shows small but measurable growth of lung lesions; bones are stable             (c) palbociclib dose increased to 75 mg daily 21/7 beginning 12/23/2017

## 2019-01-30 NOTE — Progress Notes (Signed)
EEG Completed; Results Pending  

## 2019-01-30 NOTE — ED Notes (Signed)
EEG at bedside.

## 2019-01-30 NOTE — ED Notes (Signed)
hospitalist at bedside

## 2019-01-30 NOTE — ED Notes (Signed)
Echocardiogram being completed at bedside

## 2019-01-30 NOTE — ED Notes (Signed)
Pt transported to MRI 

## 2019-01-30 NOTE — ED Notes (Signed)
EMS here to transfer pt to [facility] per MD order. Spoke with Levada Dy at [facility]- to give care update , and inform pt is now on the way.

## 2019-01-30 NOTE — ED Notes (Signed)
Patient refused aspirin and Claritin, stating "you are going to kill me with all of the medicine." Medication counseling provided but patient still refuses. Patient did take xanax for anxiety and repositioned in bed to sleep. Will continue to monitor patient.

## 2019-01-30 NOTE — Progress Notes (Addendum)
PROGRESS NOTE    Erica Young  OZH:[id number] DOB: 1941/02/11 DOA: 01/29/2019 PCP: Isaac Bliss, Rayford Halsted, MD  Brief Narrative: 78 year old extremely anxious female with metastatic breast cancer, extensive bony metastasis was at [facility] yesterday, following her Faslodex injection which she has received multiple times in the past she reported sudden onset severe headache, dizziness followed by loss of consciousness, this lasted around 2 minutes when she regained consciousness she was noted to have a left facial droop, mild slurring and left arm, leg weakness. -Code stroke was activated, patient sent to the emergency room, she was seen by neurology, MRI was ordered, not felt to be a TPA candidate due to known intracranial lesion.  )Even back in 2016 she had imaging noting enhancing bone lesion right posterior parietal bone consistent with metastatic disease with epidural extension and a 28 mm enhancing mass left cribriform plate compatible with metastatic disease with intracranial extension) -MRI negative for acute stroke, 5 cm left nasal cavity mass extending to the cribriform plate and causing postobstructive sinusitis   Assessment & Plan:   Syncope, left-sided weakness -MRI brain negative for acute CVA or intracranial metastasis -Still has mild residual left upper extremity and lower extremity weakness -Per neurology focal seizure with postictal Todd's paralysis versus MRI negative infarction are considerations -MRI noted 5 cm left nasal cavity mass extending to the cribriform plate, primary nasal tumor cannot be ruled out per radiology however she has a known metastatic lesion near her left cribriform plate from back in 6295, suspect this is contiguous with that, will discuss with Dr. Jana Hakim -Patient seen and was a long ER awaiting transfer to [facility] -Check EEG -Monitor on telemetry, check 2D echocardiogram, PT OT evaluation  Stage IV metastatic breast cancer  -Followed  by Dr. Jana Hakim, extensive bony metastasis  -Gets Faslodex infusions, also on Ibrance-this is resumed, advised son to bring this from home -Will notify Dr. Jana Hakim of admission  Severe anxiety -Continue Xanax as needed per home regimen  Mild leukopenia -Secondary to chemotherapy, monitor  Recent dental infection -Remains on amoxicillin, this was continued  DVT prophylaxis: heparin SQ Code Status: discussed code status with pt, she wishes to be DNR Family Communication: Discussed with son at bedside Disposition Plan: In [place] long ER awaiting transfer to [facility]  Consultants:  Neurology  Procedures:   Antimicrobials:    Subjective: -Patient seen extremely anxious, continues to have left-sided weakness, worried about her chemotherapy medication  Objective: Vitals:   01/30/19 0600 01/30/19 0810 01/30/19 0900 01/30/19 1000  BP: (!) 150/71 (!) 143/72 111/74 118/68  Pulse: 83 91 80 79  Resp: 19 18 19  (!) 22  Temp:      TempSrc:      SpO2: 96% 96% 97% 94%   No intake or output data in the 24 hours ending 01/30/19 1151 There were no vitals filed for this visit.  Examination:  General exam: Anxious elderly female sitting up in bed, restless Respiratory system: Clear to auscultation Cardiovascular system: S1 & S2 heard, RRR.  Gastrointestinal system: Abdomen is nondistended, soft and nontender.Normal bowel sounds heard. Central nervous system: Alert and orientedx3, strongly anxious, mild left arm and left leg weakness, sensations intact, no facial droop noted Extremities: Symmetric 5 x 5 power. Skin: No rashes, lesions or ulcers Psychiatry: Extremely anxious   Data Reviewed:   CBC: Recent Labs  Lab 01/23/19 1516 01/29/19 0855 01/29/19 1047 01/29/19 1048  WBC 3.4* 2.3*  --  2.3*  NEUTROABS 2.0 1.3*  --  1.3*  HGB 13.2 12.2 11.9* 12.6  HCT 38.3 36.0 35.0* 37.8  MCV 112.3* 112.5*  --  112.5*  PLT 187 294  --  992   Basic Metabolic Panel: Recent Labs  Lab  01/23/19 1516 01/29/19 0855 01/29/19 1047 01/29/19 1048  NA 140 139 138 136  K 4.2 4.6 4.1 4.6  CL 103 104 106 104  CO2 25 28  --  24  GLUCOSE 102* 102* 90 97  BUN 15 20 19 20   CREATININE 0.73 0.77 0.60 0.72  CALCIUM 9.1 9.1  --  8.8*   GFR: Estimated Creatinine Clearance: 45.8 mL/min (by C-G formula based on SCr of 0.72 mg/dL). Liver Function Tests: Recent Labs  Lab 01/23/19 1516 01/29/19 0855 01/29/19 1048  AST 22 16 28   ALT 19 13 16   ALKPHOS 82 82 73  BILITOT 0.4 0.3 0.6  PROT 7.2 7.2 7.1  ALBUMIN 4.0 3.7 4.0   No results for input(s): LIPASE, AMYLASE in the last 168 hours. No results for input(s): AMMONIA in the last 168 hours. Coagulation Profile: Recent Labs  Lab 01/29/19 1048  INR 1.1   Cardiac Enzymes: No results for input(s): CKTOTAL, CKMB, CKMBINDEX, TROPONINI in the last 168 hours. BNP (last 3 results) No results for input(s): PROBNP in the last 8760 hours. HbA1C: Recent Labs    01/30/19 0647  HGBA1C 5.7*   CBG: Recent Labs  Lab 01/29/19 1037  GLUCAP 96   Lipid Profile: Recent Labs    01/30/19 0647  CHOL 145  HDL 62  LDLCALC 70  TRIG 64  CHOLHDL 2.3   Thyroid Function Tests: No results for input(s): TSH, T4TOTAL, FREET4, T3FREE, THYROIDAB in the last 72 hours. Anemia Panel: No results for input(s): VITAMINB12, FOLATE, FERRITIN, TIBC, IRON, RETICCTPCT in the last 72 hours. Urine analysis:    Component Value Date/Time   COLORURINE STRAW (A) 11/19/2016 1750   APPEARANCEUR CLEAR 11/19/2016 1750   LABSPEC 1.003 (L) 11/19/2016 1750   PHURINE 7.0 11/19/2016 1750   GLUCOSEU NEGATIVE 11/19/2016 1750   HGBUR NEGATIVE 11/19/2016 1750   BILIRUBINUR NEGATIVE 11/19/2016 1750   BILIRUBINUR neg 02/16/2015 1318   KETONESUR NEGATIVE 11/19/2016 1750   PROTEINUR NEGATIVE 11/19/2016 1750   UROBILINOGEN 0.2 02/16/2015 1318   UROBILINOGEN 0.2 11/18/2013 1321   NITRITE NEGATIVE 11/19/2016 1750   LEUKOCYTESUR TRACE (A) 11/19/2016 1750   Sepsis  Labs: @LABRCNTIP (procalcitonin:4,lacticidven:4)  ) Recent Results (from the past 240 hour(s))  SARS CORONAVIRUS 2 (TAT 6-24 HRS) Nasopharyngeal Nasopharyngeal Swab     Status: None   Collection Time: 01/29/19  1:44 PM   Specimen: Nasopharyngeal Swab  Result Value Ref Range Status   SARS Coronavirus 2 NEGATIVE NEGATIVE Final    Comment: (NOTE) SARS-CoV-2 target nucleic acids are NOT DETECTED. The SARS-CoV-2 RNA is generally detectable in upper and lower respiratory specimens during the acute phase of infection. Negative results do not preclude SARS-CoV-2 infection, do not rule out co-infections with other pathogens, and should not be used as the sole basis for treatment or other patient management decisions. Negative results must be combined with clinical observations, patient history, and epidemiological information. The expected result is Negative. Fact Sheet for Patients: SugarRoll.be Fact Sheet for Healthcare Providers: https://www.woods-mathews.com/ This test is not yet approved or cleared by the [place] FDA and  has been authorized for detection and/or diagnosis of SARS-CoV-2 by FDA under an Emergency Use Authorization (EUA). This EUA will remain  in effect (meaning this test can be used) for the duration  of the COVID-19 declaration under Section 56 4(b)(1) of the Act, 21 U.S.C. section 360bbb-3(b)(1), unless the authorization is terminated or revoked sooner. Performed at [facility], [place] [address], [place], [place] [address]          Radiology Studies: Ct Angio Head W/cm &/or Wo Cm  Result Date: 01/29/2019 CLINICAL DATA:  Left-sided weakness, slurred speech, headache. EXAM: CT ANGIOGRAPHY HEAD AND NECK TECHNIQUE: Multidetector CT imaging of the head and neck was performed using the standard protocol during bolus administration of intravenous contrast. Multiplanar CT image reconstructions and MIPs were obtained to  evaluate the vascular anatomy. Carotid stenosis measurements (when applicable) are obtained utilizing NASCET criteria, using the distal internal carotid diameter as the denominator. CONTRAST:  122mL OMNIPAQUE IOHEXOL 350 MG/ML SOLN COMPARISON:  Noncontrast head CT 11/29/2018, brain MRI 11/11/2014, MRA head neck 12/19/2009, nuclear medicine bone scan 03/17/2018 FINDINGS: CTA NECK FINDINGS Aortic arch: Common origin of the innominate and left common carotid arteries. Included portions of the aortic arch demonstrate no evidence of dissection or aneurysm. Mild atherosclerotic calcification of the visualized aortic arch. Right carotid system: CCA and ICA widely patent within the neck without significant stenosis. Left carotid system: CCA and ICA widely patent within the neck without significant stenosis. Vertebral arteries: The right vertebral artery is slightly dominant. Streak artifact from a dense left-sided contrast bolus limits evaluation of the V1 and proximal V2 left vertebral artery. Within this limitation, the bilateral vertebral arteries are patent within the neck without significant stenosis (50% or greater). Skeleton: Mild cervical spondylosis. Known right parietal calvarial metastasis. Please refer to previous nuclear medicine bone scan 03/17/2018 for description of additional osseous metastases. Other neck: No mass or enlarged lymph nodes. The thyroid gland is mildly heterogeneous but otherwise unremarkable. Upper chest: No consolidation within the imaged lung apices. Review of the MIP images confirms the above findings CTA HEAD FINDINGS Anterior circulation: Mild scattered soft and calcified plaque within the intracranial internal carotid arteries. The intracranial internal carotid arteries are patent bilaterally without significant stenosis. Right middle and anterior cerebral arteries patent without high-grade proximal stenosis. Left middle and anterior cerebral arteries patent without high-grade proximal  stenosis. 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 A tiny 1 mm aneurysm arising from a right M2 branch vessel is also questioned (series 7, image 230) (series 9, image 63). Posterior circulation: Intracranial vertebral arteries are patent without high-grade stenosis. Basilar artery is patent without high-grade stenosis. Bilateral posterior cerebral arteries patent without high-grade proximal stenosis. Predominantly fetal origin of the right posterior cerebral artery. Venous sinuses: Within limitations of contrast timing, no convincing thrombus. Anatomic variants: None significant Other: Again demonstrated is a mass centered within the region of the left cribriform plate and left ethmoid sinuses. The mass measures approximately 4.1 x 3.0 x 5.0 cm (AP x TV x CC) . There is associated expansile bony remodeling with osseous erosion through the cribriform plate into the intracranial compartment. There is also lateral bowing/erosion of the left lamina papyracea. The mass extends into the left nasal passage and partly into the left maxillary sinus. Associated complete a opacification of obstructed left frontal and left sphenoid sinuses. There is also extensive partial opacification of bilateral ethmoid air cells. Significant rightward deviation of the bony nasal septum. Previously this mass measured 2.8 x 2.8 x 2.8 cm on MRI 11/11/2014. Review of the MIP images confirms the above findings IMPRESSION:  CTA head: 1. No intracranial large vessel occlusion or proximal high-grade arterial stenosis identified. 2. 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 3. Question tiny 1 mm aneurysm arising from a right M2 branch vessel, as described. 4. 4.1 x 3.0 x 5.0 cm mass centered in the region of the left cribriform plate/left ethmoid sinuses with expansile bony remodeling and osseous  erosion as described. As before, there is erosion of the cribriform plate with some extension into the intracranial compartment. Although non-specific, this mass may reflect a metastasis and has increased in size since MRI 11/11/2014. CTA neck: 1. The bilateral common and cervical internal carotid arteries are widely patent without significant stenosis 2. Streak artifact from a dense left-sided contrast bolus limits evaluation of the V1 and proximal V2 left vertebral artery. Within this limitation, the bilateral cervical vertebral arteries are patent without significant stenosis. 3. Mild atherosclerotic disease within the visualized aortic arch. Electronically Signed   By: Kellie Simmering   On: 01/29/2019 12:25   Ct Angio Neck W And/or Wo Contrast  Result Date: 01/29/2019 CLINICAL DATA:  Left-sided weakness, slurred speech, headache. EXAM: CT ANGIOGRAPHY HEAD AND NECK TECHNIQUE: Multidetector CT imaging of the head and neck was performed using the standard protocol during bolus administration of intravenous contrast. Multiplanar CT image reconstructions and MIPs were obtained to evaluate the vascular anatomy. Carotid stenosis measurements (when applicable) are obtained utilizing NASCET criteria, using the distal internal carotid diameter as the denominator. CONTRAST:  172mL OMNIPAQUE IOHEXOL 350 MG/ML SOLN COMPARISON:  Noncontrast head CT 11/29/2018, brain MRI 11/11/2014, MRA head neck 12/19/2009, nuclear medicine bone scan 03/17/2018 FINDINGS: CTA NECK FINDINGS Aortic arch: Common origin of the innominate and left common carotid arteries. Included portions of the aortic arch demonstrate no evidence of dissection or aneurysm. Mild atherosclerotic calcification of the visualized aortic arch. Right carotid system: CCA and ICA widely patent within the neck without significant stenosis. Left carotid system: CCA and ICA widely patent within the neck without significant stenosis. Vertebral arteries: The right vertebral  artery is slightly dominant. Streak artifact from a dense left-sided contrast bolus limits evaluation of the V1 and proximal V2 left vertebral artery. Within this limitation, the bilateral vertebral arteries are patent within the neck without significant stenosis (50% or greater). Skeleton: Mild cervical spondylosis. Known right parietal calvarial metastasis. Please refer to previous nuclear medicine bone scan 03/17/2018 for description of additional osseous metastases. Other neck: No mass or enlarged lymph nodes. The thyroid gland is mildly heterogeneous but otherwise unremarkable. Upper chest: No consolidation within the imaged lung apices. Review of the MIP images confirms the above findings CTA HEAD FINDINGS Anterior circulation: Mild scattered soft and calcified plaque within the intracranial internal carotid arteries. The intracranial internal carotid arteries are patent bilaterally without significant stenosis. Right middle and anterior cerebral arteries patent without high-grade proximal stenosis. Left middle and anterior cerebral arteries patent without high-grade proximal stenosis. 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 A tiny 1 mm aneurysm arising from a right M2 branch vessel is also questioned (series 7, image 230) (series 9, image 63). Posterior circulation: Intracranial vertebral arteries are patent without high-grade stenosis. Basilar artery is patent without high-grade stenosis. Bilateral posterior cerebral arteries patent without high-grade proximal stenosis. Predominantly fetal origin of the right posterior cerebral artery. Venous sinuses: Within limitations of contrast timing, no convincing thrombus. Anatomic variants: None  significant Other: Again demonstrated is a mass centered within the region of the left cribriform plate and left ethmoid sinuses. The mass measures approximately 4.1 x 3.0 x  5.0 cm (AP x TV x CC) . There is associated expansile bony remodeling with osseous erosion through the cribriform plate into the intracranial compartment. There is also lateral bowing/erosion of the left lamina papyracea. The mass extends into the left nasal passage and partly into the left maxillary sinus. Associated complete a opacification of obstructed left frontal and left sphenoid sinuses. There is also extensive partial opacification of bilateral ethmoid air cells. Significant rightward deviation of the bony nasal septum. Previously this mass measured 2.8 x 2.8 x 2.8 cm on MRI 11/11/2014. Review of the MIP images confirms the above findings IMPRESSION: CTA head: 1. No intracranial large vessel occlusion or proximal high-grade arterial stenosis identified. 2. 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 3. Question tiny 1 mm aneurysm arising from a right M2 branch vessel, as described. 4. 4.1 x 3.0 x 5.0 cm mass centered in the region of the left cribriform plate/left ethmoid sinuses with expansile bony remodeling and osseous erosion as described. As before, there is erosion of the cribriform plate with some extension into the intracranial compartment. Although non-specific, this mass may reflect a metastasis and has increased in size since MRI 11/11/2014. CTA neck: 1. The bilateral common and cervical internal carotid arteries are widely patent without significant stenosis 2. Streak artifact from a dense left-sided contrast bolus limits evaluation of the V1 and proximal V2 left vertebral artery. Within this limitation, the bilateral cervical vertebral arteries are patent without significant stenosis. 3. Mild atherosclerotic disease within the visualized aortic arch. Electronically Signed   By: Kellie Simmering   On: 01/29/2019 12:25   Mr Brain Wo Contrast  Result Date: 01/29/2019 CLINICAL DATA:  78 year old female with code stroke presentation. Sudden onset  left side weakness at 0955 hours. Slurred speech and headache. 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 EXAM: MRI HEAD WITHOUT CONTRAST TECHNIQUE: Multiplanar, multiecho pulse sequences of the brain and surrounding structures were obtained without intravenous contrast. COMPARISON:  CT head, CTA head and neck earlier today. Brain MRI 11/01/2014. FINDINGS: Brain: No restricted diffusion to suggest acute infarction. No midline shift, mass effect, evidence of mass lesion, ventriculomegaly, extra-axial collection or acute intracranial hemorrhage. Cervicomedullary junction and pituitary are within normal limits. Mild for age scattered subcortical cerebral white matter T2 and FLAIR hyperintensity in both hemispheres (series 6, image 18) has mildly increased since 2016. There could be a subtle area of right superior sensory strip encephalomalacia on image 21 which is chronic. No evidence of vasogenic edema. No other cortical encephalomalacia. No chronic cerebral blood products. The deep gray nuclei and brainstem are negative. The deep gray nuclei, brainstem and cerebellum are negative; probable small crossing right cerebellar vessel rather than tiny chronic lacune on series 8, image 31. Vascular: Major intracranial vascular flow voids are stable since 2016. Skull and upper cervical spine: Regressed right posterior skull metastasis since 2016 on series 6, image 14. heterogeneous bone marrow signal elsewhere with no destructive osseous lesion identified. Negative visible cervical spine. Sinuses/Orbits: Large and chronic but substantially increased heterogeneously T2 hyperintense mass centered in the left nasal cavity now estimated up to 47 millimeters diameter (29 millimeters in 2016). Subsequent left side paranasal sinus obstruction with diffuse retained secretions and mucosal thickening. Interestingly, the mass demonstrates facilitated diffusion (series 901, image 36).  No contrast administered today.  Previously the mass was homogeneously enhancing. The right ethmoid air cells are also obstructed and opacified, but the remaining right paranasal sinuses are well pneumatized. The bilateral orbits soft tissues remain normal. Other: Grossly negative visible internal auditory structures. Chronic right mastoid effusion is stable. Left mastoids remain clear. Negative scalp soft tissues. IMPRESSION: 1. Negative for acute infarct, and no significant change in the noncontrast MRI appearance of the brain since 2016. 2. 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 Subsequent paranasal sinus obstruction and left-side sinus mucocele formation. 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 Follow-up with ENT recommended. 3. Regressed right parietal bone metastasis since 2016. Electronically Signed   By: Genevie Ann M.D.   On: 01/29/2019 22:47   Mr Brain W Contrast  Result Date: 01/30/2019 CLINICAL DATA:  Follow-up abnormal brain MRI EXAM: MRI HEAD WITH CONTRAST TECHNIQUE: Multiplanar, multiecho pulse sequences of the brain and surrounding structures were obtained with intravenous contrast. CONTRAST:  16mL GADAVIST GADOBUTROL 1 MMOL/ML IV SOLN COMPARISON:  Brain MRI from yesterday FINDINGS: Brain: No evidence of hematogenous metastasis to the brain. Vascular: Major vessels are enhancing Skull and upper cervical spine: Decreased enhancement and expansion of right parietal bone metastasis seen on comparison. There is only minimal enhancement today and no dural distortion. Sinuses/Orbits: Large mass centered in the left nasal cavity with cribriform plate extension and dural distortion. Mass measures up to 5 cm craniocaudal and obstructs left-sided sinuses. There is extensive bony erosion at the level of the mass, which has  significantly enlarged since 2016. No visible brain invasion. IMPRESSION: 1. 5 cm left nasal cavity mass extending to the cribriform plate and causing postobstructive sinusitis. Given metastatic disease elsewhere has responded in this lesion has enlarged, favor a primary nasal mass rather than metastatic disease. 2. No worrisome findings at the regressed right parietal bone metastasis. 3. No evidence of metastasis to the brain. Electronically Signed   By: Monte Fantasia M.D.   On: 01/30/2019 08:13   Ct Head Code Stroke Wo Contrast  Result Date: 01/29/2019 CLINICAL DATA:  Code stroke. Cerebral hemorrhage suspected, possible stroke. Additional history provided: Patient at [facility] with sudden left-sided weakness at 09:55. Slurred speech. EXAM: CT HEAD WITHOUT CONTRAST TECHNIQUE: Contiguous axial images were obtained from the base of the skull through the vertex without intravenous contrast. COMPARISON:  Report from head CT 04/11/2015 (images unavailable), brain MRI [id number] FINDINGS: Brain: No evidence of acute intracranial hemorrhage. No demarcated cortical infarction. No midline shift or extra-axial fluid collection. Mild generalized parenchymal atrophy. Partially empty sella turcica. Vascular: No definite hyperdense vessel. Skull: No calvarial fracture. Irregular lucency within the right parietal bone consistent with known right parietal bone metastasis. Sinuses/Orbits: Incompletely imaged partially calcified mass centered in the region of the left cribriform plate and left ethmoid sinuses. As before, there is erosion through the cribriform plate with intracranial extension. Associated bony expansion and remodeling. This includes redemonstrated lateral bowing/erosion of the left lamina papyracea. The mass extends partly into the left nasal passage and left maxillary sinus. Associated complete opacification of the left frontal and sphenoid sinuses. The mass measures 3.8 x 3.1 cm (AP x TV) and at least 3.3 cm  in craniocaudal dimension (series 2, image 1) (series 6, image 27). On prior brain  MRI 11/11/2014, this mass measured 2.8 x 2.8 x 2.8 cm ASPECTS [facility] Stroke Program Early CT Score) - Ganglionic level infarction (caudate, lentiform nuclei, internal capsule, insula, M1-M3 cortex): 7 - Supraganglionic infarction (M4-M6 cortex): 3 Total score (0-10 with 10 being normal): 10 These results were called by telephone at the time of interpretation on 01/29/2019 at 10:53 am to provider [facility] , who verbally acknowledged these results. IMPRESSION: 1. No evidence of intracranial hemorrhage or acute demarcated cortical infarction 2. Incompletely imaged partially calcified mass centered in the region of the left cribriform plate and left ethmoid sinuses. As before, there is erosion through the cribriform plate with intracranial extension. The mass measures 3.8 x 3.1 cm (AP x TV) and at least 3.3 cm in craniocaudal dimension (previously 2.8 x 2.8 x 2.8 cm on MRI 11/11/2014). Complete opacification of obstructed left frontal and left sphenoid sinuses. 3. Known right parietal bone metastasis. Electronically Signed   By: Kellie Simmering   On: 01/29/2019 11:06        Scheduled Meds:  amoxicillin  500 mg Oral Q8H   aspirin  300 mg Rectal Daily   Or   aspirin  325 mg Oral Daily   ketorolac  30 mg Intravenous Once   loratadine  10 mg Oral Daily   metoCLOPramide (REGLAN) injection  10 mg Intravenous Once   metroNIDAZOLE  500 mg Oral BID   ondansetron  4 mg Oral Once   palbociclib  75 mg Oral Daily   prochlorperazine  10 mg Intravenous Once   sodium chloride flush  3 mL Intravenous Once   Continuous Infusions:  sodium chloride       LOS: 0 days    Time spent: 23min    Domenic Polite, MD [facility]  01/30/2019, 11:51 AM

## 2019-01-31 ENCOUNTER — Encounter (HOSPITAL_COMMUNITY): Payer: Self-pay

## 2019-01-31 DIAGNOSIS — C50911 Malignant neoplasm of unspecified site of right female breast: Principal | ICD-10-CM

## 2019-01-31 DIAGNOSIS — I429 Cardiomyopathy, unspecified: Secondary | ICD-10-CM

## 2019-01-31 LAB — GLUCOSE, CAPILLARY: Glucose-Capillary: 117 mg/dL — ABNORMAL HIGH (ref 70–99)

## 2019-01-31 LAB — BRAIN NATRIURETIC PEPTIDE: B Natriuretic Peptide: 13.4 pg/mL (ref 0.0–100.0)

## 2019-01-31 MED ORDER — CARVEDILOL 6.25 MG PO TABS
6.2500 mg | ORAL_TABLET | Freq: Two times a day (BID) | ORAL | Status: DC
  Administered 2019-02-01 – 2019-02-04 (×6): 6.25 mg via ORAL
  Filled 2019-01-31 (×7): qty 1

## 2019-01-31 MED ORDER — DILTIAZEM HCL ER COATED BEADS 120 MG PO CP24
120.0000 mg | ORAL_CAPSULE | Freq: Every day | ORAL | Status: AC
  Administered 2019-01-31: 120 mg via ORAL
  Filled 2019-01-31: qty 1

## 2019-01-31 MED ORDER — SPIRONOLACTONE 12.5 MG HALF TABLET
12.5000 mg | ORAL_TABLET | Freq: Every day | ORAL | Status: DC
  Administered 2019-01-31 – 2019-02-04 (×5): 12.5 mg via ORAL
  Filled 2019-01-31 (×5): qty 1

## 2019-01-31 NOTE — Progress Notes (Signed)
Subjective: Left-sided weakness is resolved. Some dizziness with standing.   Exam: Vitals:   01/31/19 0753 01/31/19 1209  BP: (!) 142/91 102/60  Pulse: 98 (!) 107  Resp: 18 16  Temp: 97.9 F (36.6 C) 97.8 F (36.6 C)  SpO2: 95% 90%   Gen: In bed, NAD Resp: non-labored breathing, no acute distress Abd: soft, nt  Neuro: MS: Awake, alert, interactive and appropriate CN: Pupils equal round reactive, EOMI Motor: Symmetric strength Sensory:intact to LT  Pertinent Labs: MRI - negative EEG - negative. Echo -EF 30 to 35%  Impression: 78 year old female with syncope and subsequent left-sided weakness and headache.  The etiology is slightly unclear.  I do not think that seizure is very likely, though given the persistent deficits afterwards, is a possibility.  With negative EEG I would not start medication  Complicated migraine is a possibility, and it does sound like she has a history of migraines.  This would not explain the syncope, but they could be temporally related but separate events.  Her current dizziness sounds like lightheadedness, and I suspect may be due to her new heart failure.  MRI negative stroke/TIA is less likely, though I think that a baby aspirin daily would be a low risk intervention.  Her LDL is already 70 and therefore she would not need any change to lipid therapy.  Recommendations: 1) continue ASA 81 mg daily 2) she can follow-up with neurology as an outpatient. 3) work-up of low EF per internal medicine 4) please call with further questions or concerns  Roland Rack, MD [facility] [phone]  If 7pm- 7am, please page neurology on call as listed in [place].

## 2019-01-31 NOTE — Progress Notes (Signed)
SLP Cancellation Note  Patient Details Name: Erica Young MRN: [id number] DOB: [birth year]   Cancelled treatment:       Reason Eval/Treat Not Completed: SLP screened, no needs identified, will sign off   Dannial Monarch 01/31/2019, 1:40 PM   Sonia Baller, MA, CCC-SLP Speech Therapy [facility] Pager: [phone]

## 2019-01-31 NOTE — Progress Notes (Addendum)
PROGRESS NOTE  Erica Young UDJ:[id number] DOB: 03-05-1941 DOA: 01/29/2019 PCP: Isaac Bliss, Rayford Halsted, MD  HPI/Recap of past 62 hours:  78 year old extremely anxious female with metastatic breast cancer, extensive bony metastasis was at [facility] yesterday, following her Faslodex injection which she has received multiple times in the past she reported sudden onset severe headache, dizziness followed by loss of consciousness, this lasted around 2 minutes when she regained consciousness she was noted to have a left facial droop, mild slurring and left arm, leg weakness. -Code stroke was activated, patient sent to the emergency room, she was seen by neurology, MRI was ordered, not felt to be a TPA candidate due to known intracranial lesion.  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  01/31/19: Patient was seen and examined at her bedside this morning.  States she has been doing some leg exercises in the bed to gain her strength back.  Left upper extremity weakness improved.  Reports chronic nasal stuffiness in the setting of worsening nasal mass.  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  No sign of acute brain infarct. We will contact Dr. Jana Hakim and discuss findings. 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 Jana Hakim Gets Faslodex infusions, also on Ibrance-this is resumed, advised son to bring this from home -Will notify Dr. Jana Hakim of admission Continue supportive care  Resolving left upper and left lower extremity weakness/reported dysarthria or aphasia, unclear etiology Negative MRI brain for acute infarct LDL 170 on 01/30/2019 A1c 5.7 on 01/30/19. PT OT to assess Fall precautions  Leukopenia likely secondary to recent chemotherapy No sign of active infective process Continue to monitor  Severe anxiety Continue Xanax as needed per home regimen  Recent dental infection C/w amoxicillin 500 mg TID, flagyl po  500 mg BID  DVT prophylaxis: heparin SQ 3 times daily Code Status: DNR Family Communication:  None at bedside   Disposition Plan:  Patient is currently not appropriate for discharge at this time due to newly diagnosed acute systolic CHF in the setting of chronic diastolic CHF requiring cardiology consult for possible adjustment of cardiac medications.  Patient will require at least another midnight for further evaluation and treatment of present condition.  Consultants:  Neurology  Procedures:   Antimicrobials:    Objective: Vitals:   01/31/19 0050 01/31/19 0100 01/31/19 0430 01/31/19 0753  BP: 112/62  112/70 (!) 142/91  Pulse: (!) 108  Marland Kitchen)  102 98  Resp:   20 18  Temp: 98.7 F (37.1 C)  98.9 F (37.2 C) 97.9 F (36.6 C)   TempSrc: Oral  Oral Oral  SpO2: (!) 88% 93% 93% 95%   No intake or output data in the 24 hours ending 01/31/19 1103 There were no vitals filed for this visit.  Exam:  . General: 78 y.o. year-old female well developed well nourished in no acute distress.  Alert and oriented x3. . Cardiovascular: Regular rate and rhythm with no rubs or gallops.  No thyromegaly or JVD noted.   Marland Kitchen Respiratory: Clear to auscultation with no wheezes or rales. Good inspiratory effort. . Abdomen: Soft nontender nondistended with normal bowel sounds x4 quadrants. . Musculoskeletal: No lower extremity edema. 2/4 pulses in all 4 extremities. Marland Kitchen Psychiatry: Mood is appropriate for condition and setting   Data Reviewed: CBC: Recent Labs  Lab 01/29/19 0855 01/29/19 1047 01/29/19 1048  WBC 2.3*  --  2.3*  NEUTROABS 1.3*  --  1.3*  HGB 12.2 11.9* 12.6  HCT 36.0 35.0* 37.8  MCV 112.5*  --  112.5*  PLT 294  --  450   Basic Metabolic Panel: Recent Labs  Lab 01/29/19 0855 01/29/19 1047 01/29/19 1048  NA 139 138 136  K 4.6 4.1 4.6  CL 104 106 104  CO2 28  --  24  GLUCOSE 102* 90 97  BUN 20 19 20   CREATININE 0.77 0.60 0.72  CALCIUM 9.1  --  8.8*   GFR: Estimated Creatinine Clearance: 45.8 mL/min (by C-G formula based on SCr of 0.72 mg/dL). Liver Function Tests: Recent Labs  Lab 01/29/19 0855 01/29/19 1048  AST 16 28  ALT 13 16  ALKPHOS 82 73  BILITOT 0.3 0.6  PROT 7.2 7.1  ALBUMIN 3.7 4.0   No results for input(s): LIPASE, AMYLASE in the last 168 hours. No results for input(s): AMMONIA in the last 168 hours. Coagulation Profile: Recent Labs  Lab 01/29/19 1048  INR 1.1   Cardiac Enzymes: No results for input(s): CKTOTAL, CKMB, CKMBINDEX, TROPONINI in the last 168 hours. BNP (last 3 results) No results for input(s): PROBNP in the last 8760 hours. HbA1C: Recent Labs    01/30/19 0647  HGBA1C 5.7*   CBG: Recent Labs  Lab 01/29/19 1037  GLUCAP 96   Lipid Profile: Recent Labs     01/30/19 0647  CHOL 145  HDL 62  LDLCALC 70  TRIG 64  CHOLHDL 2.3   Thyroid Function Tests: No results for input(s): TSH, T4TOTAL, FREET4, T3FREE, THYROIDAB in the last 72 hours. Anemia Panel: No results for input(s): VITAMINB12, FOLATE, FERRITIN, TIBC, IRON, RETICCTPCT in the last 72 hours. Urine analysis:    Component Value Date/Time   COLORURINE STRAW (A) 11/19/2016 1750   APPEARANCEUR CLEAR 11/19/2016 1750   LABSPEC 1.003 (L) 11/19/2016 1750   PHURINE 7.0 11/19/2016 1750   GLUCOSEU NEGATIVE 11/19/2016 1750   HGBUR NEGATIVE 11/19/2016 1750   BILIRUBINUR NEGATIVE 11/19/2016 1750   BILIRUBINUR neg 02/16/2015 1318   KETONESUR NEGATIVE 11/19/2016 1750   PROTEINUR NEGATIVE 11/19/2016 1750   UROBILINOGEN 0.2 02/16/2015 1318   UROBILINOGEN 0.2 11/18/2013 1321   NITRITE NEGATIVE 11/19/2016 1750   LEUKOCYTESUR TRACE (A) 11/19/2016 1750   Sepsis Labs: @LABRCNTIP (procalcitonin:4,lacticidven:4)  ) Recent Results (from the past 240 hour(s))  SARS CORONAVIRUS 2 (TAT 6-24 HRS) Nasopharyngeal Nasopharyngeal Swab     Status: None   Collection Time: 01/29/19  1:44 PM   Specimen: Nasopharyngeal Swab  Result  Value Ref Range Status   SARS Coronavirus 2 NEGATIVE NEGATIVE Final    Comment: (NOTE) SARS-CoV-2 target nucleic acids are NOT DETECTED. The SARS-CoV-2 RNA is generally detectable in upper and lower respiratory specimens during the acute phase of infection. Negative results do not preclude SARS-CoV-2 infection, do not rule out co-infections with other pathogens, and should not be used as the sole basis for treatment or other patient management decisions. Negative results must be combined with clinical observations, patient history, and epidemiological information. The expected result is Negative. Fact Sheet for Patients: SugarRoll.be Fact Sheet for Healthcare Providers: https://www.woods-mathews.com/ This test is not yet approved or  cleared by the [place] FDA and  has been authorized for detection and/or diagnosis of SARS-CoV-2 by FDA under an Emergency Use Authorization (EUA). This EUA will remain  in effect (meaning this test can be used) for the duration of the COVID-19 declaration under Section 56 4(b)(1) of the Act, 21 U.S.C. section 360bbb-3(b)(1), unless the authorization is terminated or revoked sooner. Performed at [facility], [place] [address], [place], [place] [address]       Studies: No results found.  Scheduled Meds: . amoxicillin  500 mg Oral Q8H  . aspirin  300 mg Rectal Daily   Or  . aspirin  325 mg Oral Daily  . heparin injection (subcutaneous)  5,000 Units Subcutaneous Q8H  . ketorolac  30 mg Intravenous Once  . loratadine  10 mg Oral Daily  . metoCLOPramide (REGLAN) injection  10 mg Intravenous Once  . metroNIDAZOLE  500 mg Oral BID  . ondansetron  4 mg Oral Once  . palbociclib  75 mg Oral Daily  . prochlorperazine  10 mg Intravenous Once  . sodium chloride flush  3 mL Intravenous Once    Continuous Infusions:   LOS: 1 day     Kayleen Memos, MD [facility] Pager [phone]  If 7PM-7AM, please contact night-coverage [email or web address] Password [email or web address] 01/31/2019, 11:03 AM

## 2019-01-31 NOTE — Consult Note (Signed)
Cardiology Consultation:   Patient ID: Erica Young; [id number]; October 16, 1940   Admit date: 01/29/2019 Date of Consult: 01/31/2019  Primary Care Provider: Isaac Bliss, Rayford Halsted, MD Primary Cardiologist:  New Primary Electrophysiologist: None   Patient Profile:   Erica Young is a 78 y.o. 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 Nevada Crane.  History of Present Illness:   Ms. Vanalstine is currently admitted to the hospital with recent headache and dizziness with transient left-sided weakness at followed Faslodex injection as treatment for her breast cancer.  She had received this medication without similar issues in the past.  She was evaluated via code stroke protocol and MRI did not demonstrate obvious acute CNS event.  Incidentally, she did have a 5 cm left nasal cavity mass extending to the cribriform plate and resulting in postobstructive sinusitis, although it was not clear that this was related to her presenting symptoms were necessarily related to her cancer diagnosis.  She was evaluated by neurology and also had a follow-up EEG which did not demonstrate any seizure activity.  Cardiology is consulted due to finding of cardiomyopathy by echocardiogram and on October 2 revealed LVEF 30 to 35% with moderate RV dysfunction.  There was diffuse hypokinesis with dyskinesis of the mid to basal anterior septum and mid to basal inferior septum.  It is not clear whether she has had interval evaluation of cardiac structure and function, the last echocardiogram I found in the chart was from 2011 at which point LVEF was 60 to 65%.  She does have prior documentation of PFO by bubble study.  She does not report any shortness of breath, no orthopnea or PND, no leg swelling.  She has been on Cardizem CD as an  outpatient for treatment of hypertension.  No obvious cardiac arrhythmias.  Past Medical History:  Diagnosis Date   Allergy    Anxiety    Bone cancer ([place]) mri 11/11/14   right parietal bone,left cribriform plate metastases   Cancer ([place]) 2007   right breat ca  , lumpectomy and radiation tx (declined chemo and additional prophylactic meds)   Cataract    both eyes  10/2016 Lt.     04/2017 Rt.    CEREBROVASCULAR DISEASE 03/03/2010   Diverticulitis    DIVERTICULOSIS, COLON 03/03/2010   Fatty liver 08/02/09   as per U/S done by [facility]   Foramen ovale    positive bubble study   GERD (gastroesophageal reflux disease)    Headache(784.0)    she thinks sinus headaches   History of cerebral artery stenosis    right middle   HYPERLIPIDEMIA 03/03/2010   Hypertension    HYPOTHYROIDISM 03/03/2010   no longer on meds   Internal hemorrhoid    Low back pain    Mastoiditis    noted on brain MRI   Rib fracture    Right leg pain    Stroke [facility]) Oct. 2011   TIA   Ulcer     Past Surgical History:  Procedure Laterality Date   BREAST LUMPECTOMY     right   CATARACT EXTRACTION Left    10/2016   Rt 04/2017   CHOLECYSTECTOMY     COLONOSCOPY     POLYPECTOMY     benign cecum   SHOULDER SURGERY     right shoulder (dislocation)  TONSILLECTOMY     TUBAL LIGATION     UPPER GASTROINTESTINAL ENDOSCOPY     VIDEO BRONCHOSCOPY WITH ENDOBRONCHIAL ULTRASOUND N/A 01/07/2014   Procedure: VIDEO BRONCHOSCOPY WITH ENDOBRONCHIAL ULTRASOUND;  Surgeon: Ivin Poot, MD;  Location: [facility] OR;  Service: Thoracic;  Laterality: N/A;     Inpatient Medications: Scheduled Meds:  amoxicillin  500 mg Oral Q8H   aspirin  300 mg Rectal Daily   Or   aspirin  325 mg Oral Daily   [START ON 02/01/2019] carvedilol  6.25 mg Oral BID WC   diltiazem  120 mg Oral Daily   heparin injection (subcutaneous)  5,000 Units Subcutaneous Q8H   ketorolac  30 mg Intravenous Once    loratadine  10 mg Oral Daily   metoCLOPramide (REGLAN) injection  10 mg Intravenous Once   metroNIDAZOLE  500 mg Oral BID   ondansetron  4 mg Oral Once   palbociclib  75 mg Oral Daily   prochlorperazine  10 mg Intravenous Once   sodium chloride flush  3 mL Intravenous Once    PRN Meds: acetaminophen **OR** acetaminophen (TYLENOL) oral liquid 160 mg/5 mL **OR** acetaminophen, ALPRAZolam  Allergies:    Allergies  Allergen Reactions   Ciprofloxacin Anaphylaxis    Social History:   Social History   Socioeconomic History   Marital status: Married    Spouse name: [name]   Number of children: 2   Years of education: Not on file   Highest education level: Not on file  Occupational History   Occupation: Social Worker--Retired  Scientist, product/process development strain: Not on file   Food insecurity    Worry: Not on file    Inability: Not on file   Transportation needs    Medical: Not on file    Non-medical: Not on file  Tobacco Use   Smoking status: Former Smoker    Packs/day: 0.50    Years: 20.00    Pack years: 10.00    Types: Cigarettes    Quit date: 05/01/1991    Years since quitting: 27.7   Smokeless tobacco: Never Used  Substance and Sexual Activity   Alcohol use: No    Alcohol/week: 0.0 standard drinks   Drug use: No   Sexual activity: Yes  Lifestyle   Physical activity    Days per week: Not on file    Minutes per session: Not on file   Stress: Not on file  Relationships   Social connections    Talks on phone: Not on file    Gets together: Not on file    Attends religious service: Not on file    Active member of club or organization: Not on file    Attends meetings of clubs or organizations: Not on file    Relationship status: Not on file   Intimate partner violence    Fear of current or ex partner: Not on file    Emotionally abused: Not on file    Physically abused: Not on file    Forced sexual activity: Not on file  Other Topics  Concern   Not on file  Social History Narrative   Daily caffeine     Family History:   The patient's family history includes Heart disease in her brother, brother, sister, sister, sister, and sister; Stomach cancer in her father. There is no history of Colon cancer, Esophageal cancer, Rectal cancer, Colon polyps, or Asthma.  ROS:  Please see the history of present illness.  Hearing loss,  weakness, all other ROS reviewed and negative.     Physical Exam/Data:   Vitals:   01/31/19 0100 01/31/19 0430 01/31/19 0753 01/31/19 1209  BP:  112/70 (!) 142/91 102/60  Pulse:  (!) 102 98 (!) 107  Resp:  20 18 16   Temp:  98.9 F (37.2 C) 97.9 F (36.6 C) 97.8 F (36.6 C)  TempSrc:  Oral Oral Oral  SpO2: 93% 93% 95% 90%   No intake or output data in the 24 hours ending 01/31/19 1301 There were no vitals filed for this visit. There is no height or weight on file to calculate BMI.   Gen: Patient appears comfortable at rest. HEENT: Conjunctiva and lids normal, oropharynx clear. Neck: Supple, no elevated JVP or carotid bruits, no thyromegaly. Lungs: Clear to auscultation, nonlabored breathing at rest. Cardiac: Regular rate and rhythm, no S3 or significant systolic murmur. Abdomen: Soft, nontender, bowel sounds present. Extremities: No pitting edema, distal pulses 2+. Skin: Warm and dry. Musculoskeletal: No kyphosis. Neuropsychiatric: Alert and oriented x3, affect grossly appropriate.  Prior left-sided weakness has improved.  EKG:  An ECG dated 01/29/2019 was personally reviewed today and demonstrated:  Sinus rhythm.  Telemetry:  I personally reviewed telemetry which shows sinus rhythm.  Relevant CV Studies:  Echocardiogram 01/30/2019:  1. Left ventricular ejection fraction, by visual estimation, is 30 to 35%. The left ventricle has moderate to severely decreased function. There is mildly increased left ventricular hypertrophy.  2. Multiple segmental abnormalities exist. See findings.  3.  Left ventricular diastolic Doppler parameters are consistent with impaired relaxation pattern of LV diastolic filling.  4. Global right ventricle has moderately reduced systolic function.The right ventricular size is normal. No increase in right ventricular wall thickness.  5. Left atrial size was normal.  6. Right atrial size was normal.  7. The mitral valve is grossly normal. Trace mitral valve regurgitation.  8. The tricuspid valve is grossly normal. Tricuspid valve regurgitation is trivial.  9. The aortic valve is tricuspid Aortic valve regurgitation was not visualized by color flow Doppler. 10. The pulmonic valve was grossly normal. Pulmonic valve regurgitation is not visualized by color flow Doppler. 11. Normal pulmonary artery systolic pressure. 12. The inferior vena cava is normal in size with <50% respiratory variability, suggesting right atrial pressure of 8 mmHg. 13. The interatrial septum was not well visualized.  Laboratory Data:  Chemistry Recent Labs  Lab 01/29/19 0855 01/29/19 1047 01/29/19 1048  NA 139 138 136  K 4.6 4.1 4.6  CL 104 106 104  CO2 28  --  24  GLUCOSE 102* 90 97  BUN 20 19 20   CREATININE 0.77 0.60 0.72  CALCIUM 9.1  --  8.8*  GFRNONAA >60  --  >60  GFRAA >60  --  >60  ANIONGAP 7  --  8    Recent Labs  Lab 01/29/19 0855 01/29/19 1048  PROT 7.2 7.1  ALBUMIN 3.7 4.0  AST 16 28  ALT 13 16  ALKPHOS 82 73  BILITOT 0.3 0.6   Hematology Recent Labs  Lab 01/29/19 0855 01/29/19 1047 01/29/19 1048  WBC 2.3*  --  2.3*  RBC 3.20*  --  3.36*  HGB 12.2 11.9* 12.6  HCT 36.0 35.0* 37.8  MCV 112.5*  --  112.5*  MCH 38.1*  --  37.5*  MCHC 33.9  --  33.3  RDW 12.4  --  12.7  PLT 294  --  296    Radiology/Studies:  Ct Angio Head W/cm &/or  Wo Cm  Result Date: 01/29/2019 CLINICAL DATA:  Left-sided weakness, slurred speech, headache. EXAM: CT ANGIOGRAPHY HEAD AND NECK TECHNIQUE: Multidetector CT imaging of the head and neck was performed using the  standard protocol during bolus administration of intravenous contrast. Multiplanar CT image reconstructions and MIPs were obtained to evaluate the vascular anatomy. Carotid stenosis measurements (when applicable) are obtained utilizing NASCET criteria, using the distal internal carotid diameter as the denominator. CONTRAST:  170mL OMNIPAQUE IOHEXOL 350 MG/ML SOLN COMPARISON:  Noncontrast head CT 11/29/2018, brain MRI 11/11/2014, MRA head neck 12/19/2009, nuclear medicine bone scan 03/17/2018 FINDINGS: CTA NECK FINDINGS Aortic arch: Common origin of the innominate and left common carotid arteries. Included portions of the aortic arch demonstrate no evidence of dissection or aneurysm. Mild atherosclerotic calcification of the visualized aortic arch. Right carotid system: CCA and ICA widely patent within the neck without significant stenosis. Left carotid system: CCA and ICA widely patent within the neck without significant stenosis. Vertebral arteries: The right vertebral artery is slightly dominant. Streak artifact from a dense left-sided contrast bolus limits evaluation of the V1 and proximal V2 left vertebral artery. Within this limitation, the bilateral vertebral arteries are patent within the neck without significant stenosis (50% or greater). Skeleton: Mild cervical spondylosis. Known right parietal calvarial metastasis. Please refer to previous nuclear medicine bone scan 03/17/2018 for description of additional osseous metastases. Other neck: No mass or enlarged lymph nodes. The thyroid gland is mildly heterogeneous but otherwise unremarkable. Upper chest: No consolidation within the imaged lung apices. Review of the MIP images confirms the above findings CTA HEAD FINDINGS Anterior circulation: Mild scattered soft and calcified plaque within the intracranial internal carotid arteries. The intracranial internal carotid arteries are patent bilaterally without significant stenosis. Right middle and anterior  cerebral arteries patent without high-grade proximal stenosis. Left middle and anterior cerebral arteries patent without high-grade proximal stenosis. 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 A tiny 1 mm aneurysm arising from a right M2 branch vessel is also questioned (series 7, image 230) (series 9, image 63). Posterior circulation: Intracranial vertebral arteries are patent without high-grade stenosis. Basilar artery is patent without high-grade stenosis. Bilateral posterior cerebral arteries patent without high-grade proximal stenosis. Predominantly fetal origin of the right posterior cerebral artery. Venous sinuses: Within limitations of contrast timing, no convincing thrombus. Anatomic variants: None significant Other: Again demonstrated is a mass centered within the region of the left cribriform plate and left ethmoid sinuses. The mass measures approximately 4.1 x 3.0 x 5.0 cm (AP x TV x CC) . There is associated expansile bony remodeling with osseous erosion through the cribriform plate into the intracranial compartment. There is also lateral bowing/erosion of the left lamina papyracea. The mass extends into the left nasal passage and partly into the left maxillary sinus. Associated complete a opacification of obstructed left frontal and left sphenoid sinuses. There is also extensive partial opacification of bilateral ethmoid air cells. Significant rightward deviation of the bony nasal septum. Previously this mass measured 2.8 x 2.8 x 2.8 cm on MRI 11/11/2014. Review of the MIP images confirms the above findings IMPRESSION: CTA head: 1. No intracranial large vessel occlusion or proximal high-grade arterial stenosis identified. 2. 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 3. Question tiny 1 mm aneurysm arising from a right M2 branch vessel, as described. 4.  4.1 x 3.0 x 5.0  cm mass centered in the region of the left cribriform plate/left ethmoid sinuses with expansile bony remodeling and osseous erosion as described. As before, there is erosion of the cribriform plate with some extension into the intracranial compartment. Although non-specific, this mass may reflect a metastasis and has increased in size since MRI 11/11/2014. CTA neck: 1. The bilateral common and cervical internal carotid arteries are widely patent without significant stenosis 2. Streak artifact from a dense left-sided contrast bolus limits evaluation of the V1 and proximal V2 left vertebral artery. Within this limitation, the bilateral cervical vertebral arteries are patent without significant stenosis. 3. Mild atherosclerotic disease within the visualized aortic arch. Electronically Signed   By: Kellie Simmering   On: 01/29/2019 12:25   Ct Angio Neck W And/or Wo Contrast  Result Date: 01/29/2019 CLINICAL DATA:  Left-sided weakness, slurred speech, headache. EXAM: CT ANGIOGRAPHY HEAD AND NECK TECHNIQUE: Multidetector CT imaging of the head and neck was performed using the standard protocol during bolus administration of intravenous contrast. Multiplanar CT image reconstructions and MIPs were obtained to evaluate the vascular anatomy. Carotid stenosis measurements (when applicable) are obtained utilizing NASCET criteria, using the distal internal carotid diameter as the denominator. CONTRAST:  154mL OMNIPAQUE IOHEXOL 350 MG/ML SOLN COMPARISON:  Noncontrast head CT 11/29/2018, brain MRI 11/11/2014, MRA head neck 12/19/2009, nuclear medicine bone scan 03/17/2018 FINDINGS: CTA NECK FINDINGS Aortic arch: Common origin of the innominate and left common carotid arteries. Included portions of the aortic arch demonstrate no evidence of dissection or aneurysm. Mild atherosclerotic calcification of the visualized aortic arch. Right carotid system: CCA and ICA widely patent within the neck without significant  stenosis. Left carotid system: CCA and ICA widely patent within the neck without significant stenosis. Vertebral arteries: The right vertebral artery is slightly dominant. Streak artifact from a dense left-sided contrast bolus limits evaluation of the V1 and proximal V2 left vertebral artery. Within this limitation, the bilateral vertebral arteries are patent within the neck without significant stenosis (50% or greater). Skeleton: Mild cervical spondylosis. Known right parietal calvarial metastasis. Please refer to previous nuclear medicine bone scan 03/17/2018 for description of additional osseous metastases. Other neck: No mass or enlarged lymph nodes. The thyroid gland is mildly heterogeneous but otherwise unremarkable. Upper chest: No consolidation within the imaged lung apices. Review of the MIP images confirms the above findings CTA HEAD FINDINGS Anterior circulation: Mild scattered soft and calcified plaque within the intracranial internal carotid arteries. The intracranial internal carotid arteries are patent bilaterally without significant stenosis. Right middle and anterior cerebral arteries patent without high-grade proximal stenosis. Left middle and anterior cerebral arteries patent without high-grade proximal stenosis. 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 A tiny 1 mm aneurysm arising from a right M2 branch vessel is also questioned (series 7, image 230) (series 9, image 63). Posterior circulation: Intracranial vertebral arteries are patent without high-grade stenosis. Basilar artery is patent without high-grade stenosis. Bilateral posterior cerebral arteries patent without high-grade proximal stenosis. Predominantly fetal origin of the right posterior cerebral artery. Venous sinuses: Within limitations of contrast timing, no convincing thrombus. Anatomic variants: None significant Other: Again  demonstrated is a mass centered within the region of the left cribriform plate and left ethmoid sinuses. The mass measures approximately 4.1 x 3.0 x 5.0 cm (AP x TV x CC) . There is associated expansile bony remodeling with osseous erosion through the cribriform plate into the  intracranial compartment. There is also lateral bowing/erosion of the left lamina papyracea. The mass extends into the left nasal passage and partly into the left maxillary sinus. Associated complete a opacification of obstructed left frontal and left sphenoid sinuses. There is also extensive partial opacification of bilateral ethmoid air cells. Significant rightward deviation of the bony nasal septum. Previously this mass measured 2.8 x 2.8 x 2.8 cm on MRI 11/11/2014. Review of the MIP images confirms the above findings IMPRESSION: CTA head: 1. No intracranial large vessel occlusion or proximal high-grade arterial stenosis identified. 2. 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 3. Question tiny 1 mm aneurysm arising from a right M2 branch vessel, as described. 4. 4.1 x 3.0 x 5.0 cm mass centered in the region of the left cribriform plate/left ethmoid sinuses with expansile bony remodeling and osseous erosion as described. As before, there is erosion of the cribriform plate with some extension into the intracranial compartment. Although non-specific, this mass may reflect a metastasis and has increased in size since MRI 11/11/2014. CTA neck: 1. The bilateral common and cervical internal carotid arteries are widely patent without significant stenosis 2. Streak artifact from a dense left-sided contrast bolus limits evaluation of the V1 and proximal V2 left vertebral artery. Within this limitation, the bilateral cervical vertebral arteries are patent without significant stenosis. 3. Mild atherosclerotic disease within the visualized aortic arch. Electronically Signed   By: Kellie Simmering   On:  01/29/2019 12:25   Mr Brain Wo Contrast  Result Date: 01/29/2019 CLINICAL DATA:  78 year old female with code stroke presentation. Sudden onset left side weakness at 0955 hours. Slurred speech and headache. 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 EXAM: MRI HEAD WITHOUT CONTRAST TECHNIQUE: Multiplanar, multiecho pulse sequences of the brain and surrounding structures were obtained without intravenous contrast. COMPARISON:  CT head, CTA head and neck earlier today. Brain MRI 11/01/2014. FINDINGS: Brain: No restricted diffusion to suggest acute infarction. No midline shift, mass effect, evidence of mass lesion, ventriculomegaly, extra-axial collection or acute intracranial hemorrhage. Cervicomedullary junction and pituitary are within normal limits. Mild for age scattered subcortical cerebral white matter T2 and FLAIR hyperintensity in both hemispheres (series 6, image 18) has mildly increased since 2016. There could be a subtle area of right superior sensory strip encephalomalacia on image 21 which is chronic. No evidence of vasogenic edema. No other cortical encephalomalacia. No chronic cerebral blood products. The deep gray nuclei and brainstem are negative. The deep gray nuclei, brainstem and cerebellum are negative; probable small crossing right cerebellar vessel rather than tiny chronic lacune on series 8, image 31. Vascular: Major intracranial vascular flow voids are stable since 2016. Skull and upper cervical spine: Regressed right posterior skull metastasis since 2016 on series 6, image 14. heterogeneous bone marrow signal elsewhere with no destructive osseous lesion identified. Negative visible cervical spine. Sinuses/Orbits: Large and chronic but substantially increased heterogeneously T2 hyperintense mass centered in the left nasal cavity now estimated up to 47 millimeters diameter (29 millimeters in 2016). Subsequent left side paranasal sinus obstruction with diffuse retained  secretions and mucosal thickening. Interestingly, the mass demonstrates facilitated diffusion (series 901, image 36). No contrast administered today. Previously the mass was homogeneously enhancing. The right ethmoid air cells are also obstructed and opacified, but the remaining right paranasal sinuses are well pneumatized. The bilateral orbits soft tissues remain normal. Other: Grossly negative visible internal auditory structures. Chronic right mastoid effusion is stable. Left mastoids  remain clear. Negative scalp soft tissues. IMPRESSION: 1. Negative for acute infarct, and no significant change in the noncontrast MRI appearance of the brain since 2016. 2. 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 Subsequent paranasal sinus obstruction and left-side sinus mucocele formation. 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 Follow-up with ENT recommended. 3. Regressed right parietal bone metastasis since 2016. Electronically Signed   By: Genevie Ann M.D.   On: 01/29/2019 22:47   Mr Brain W Contrast  Result Date: 01/30/2019 CLINICAL DATA:  Follow-up abnormal brain MRI EXAM: MRI HEAD WITH CONTRAST TECHNIQUE: Multiplanar, multiecho pulse sequences of the brain and surrounding structures were obtained with intravenous contrast. CONTRAST:  40mL GADAVIST GADOBUTROL 1 MMOL/ML IV SOLN COMPARISON:  Brain MRI from yesterday FINDINGS: Brain: No evidence of hematogenous metastasis to the brain. Vascular: Major vessels are enhancing Skull and upper cervical spine: Decreased enhancement and expansion of right parietal bone metastasis seen on comparison. There is only minimal enhancement today and no dural distortion. Sinuses/Orbits: Large mass centered in the left nasal cavity with cribriform plate extension and dural  distortion. Mass measures up to 5 cm craniocaudal and obstructs left-sided sinuses. There is extensive bony erosion at the level of the mass, which has significantly enlarged since 2016. No visible brain invasion. IMPRESSION: 1. 5 cm left nasal cavity mass extending to the cribriform plate and causing postobstructive sinusitis. Given metastatic disease elsewhere has responded in this lesion has enlarged, favor a primary nasal mass rather than metastatic disease. 2. No worrisome findings at the regressed right parietal bone metastasis. 3. No evidence of metastasis to the brain. Electronically Signed   By: Monte Fantasia M.D.   On: 01/30/2019 08:13   Ct Head Code Stroke Wo Contrast  Result Date: 01/29/2019 CLINICAL DATA:  Code stroke. Cerebral hemorrhage suspected, possible stroke. Additional history provided: Patient at [facility] with sudden left-sided weakness at 09:55. Slurred speech. EXAM: CT HEAD WITHOUT CONTRAST TECHNIQUE: Contiguous axial images were obtained from the base of the skull through the vertex without intravenous contrast. COMPARISON:  Report from head CT 04/11/2015 (images unavailable), brain MRI 7146 FINDINGS: Brain: No evidence of acute intracranial hemorrhage. No demarcated cortical infarction. No midline shift or extra-axial fluid collection. Mild generalized parenchymal atrophy. Partially empty sella turcica. Vascular: No definite hyperdense vessel. Skull: No calvarial fracture. Irregular lucency within the right parietal bone consistent with known right parietal bone metastasis. Sinuses/Orbits: Incompletely imaged partially calcified mass centered in the region of the left cribriform plate and left ethmoid sinuses. As before, there is erosion through the cribriform plate with intracranial extension. Associated bony expansion and remodeling. This includes redemonstrated lateral bowing/erosion of the left lamina papyracea. The mass extends partly into the left nasal passage and left  maxillary sinus. Associated complete opacification of the left frontal and sphenoid sinuses. The mass measures 3.8 x 3.1 cm (AP x TV) and at least 3.3 cm in craniocaudal dimension (series 2, image 1) (series 6, image 27). On prior brain MRI 11/11/2014, this mass measured 2.8 x 2.8 x 2.8 cm ASPECTS [facility] Stroke Program Early CT Score) - Ganglionic level infarction (caudate, lentiform nuclei, internal capsule, insula, M1-M3 cortex): 7 - Supraganglionic infarction (M4-M6 cortex): 3 Total score (0-10 with 10 being normal): 10 These results were called by telephone at  the time of interpretation on 01/29/2019 at 10:53 am to provider [facility] , who verbally acknowledged these results. IMPRESSION: 1. No evidence of intracranial hemorrhage or acute demarcated cortical infarction 2. Incompletely imaged partially calcified mass centered in the region of the left cribriform plate and left ethmoid sinuses. As before, there is erosion through the cribriform plate with intracranial extension. The mass measures 3.8 x 3.1 cm (AP x TV) and at least 3.3 cm in craniocaudal dimension (previously 2.8 x 2.8 x 2.8 cm on MRI 11/11/2014). Complete opacification of obstructed left frontal and left sphenoid sinuses. 3. Known right parietal bone metastasis. Electronically Signed   By: Kellie Simmering   On: 01/29/2019 11:06    Assessment and Plan:   1.  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  She also has moderate RV dysfunction.  She has risks for both ischemic and nonischemic cardiomyopathies, although duration of LV dysfunction is not entirely clear, it was last normal in 2011.  She is not an active heart failure at this time or show evidence of fluid overload.  2.  Essential hypertension, on Cardizem CD as an outpatient.  3.  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  She follows with Dr. Jana Hakim and  is on active chemotherapy, immunotherapy, and hormonal therapy.  On Faslodex and Ibrance.  4.  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  This was noted incidentally by brain MRI.  5.  Improving left-sided weakness and dysarthria, etiology not clear.  Brain MRI was negative for acute stroke.  EEG did not show seizure activity.  6.  DNR status.  Would recommend medical therapy without aggressive, invasive cardiac work-up in light of current comorbidities.  Cardizem CD will be stopped with initiation of Coreg beginning at 6.25 mg twice daily tomorrow.  Also starting low-dose Aldactone.  Depending on blood pressure could then consider addition of ARB or Entresto next.   Signed, Rozann Lesches, MD  01/31/2019 1:01 PM

## 2019-01-31 NOTE — Evaluation (Signed)
Physical Therapy Evaluation Patient Details Name: Erica Young MRN: [id number] DOB: 1940/10/01 Today's Date: 01/31/2019   History of Present Illness  Pt is a 79 y/o female admitted secondary to L sided weakness and facial droop. 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 PMH including but not limited to metastatic breast cancer, anxiety, patent foramen ovale, hypertension, hyperlipidemia, chronic headaches.    Clinical Impression  Pt presented supine in bed with HOB elevated, awake and willing to participate in therapy session. Prior to admission, pt reported that she was independent with all functional mobility and ADLs. Pt has been caring for her husband who recently had a stroke. Pt lives with her husband in a single level home with two steps to enter. Pt's son stated that she can have 24/7 supervision/assistance upon d/c home if needed. At the time of evaluation, pt overall at min guard level for mobility including stair management. Pt with very slow, cautious gait in hallway, taking very short steps; however, no overt LOB or need for physical assistance. Pt would continue to benefit from skilled physical therapy services at this time while admitted and after d/c to address the below listed limitations in order to improve overall safety and independence with functional mobility.     Follow Up Recommendations Home health PT    Equipment Recommendations  None recommended by PT    Recommendations for Other Services       Precautions / Restrictions Precautions Precautions: Fall Restrictions Weight Bearing Restrictions: No      Mobility  Bed Mobility Overal bed mobility: Modified Independent                Transfers Overall transfer level: Needs assistance Equipment used: None Transfers: Sit to/from Stand Sit to Stand: Supervision         General transfer comment: supervision for  safety  Ambulation/Gait Ambulation/Gait assistance: Min guard Gait Distance (Feet): 100 Feet Assistive device: None Gait Pattern/deviations: Decreased stride length;Step-through pattern;Narrow base of support Gait velocity: decreased   General Gait Details: pt with very slow, cautious and guarded gait; no LOB or physical assistance needed  Stairs Stairs: Yes Stairs assistance: Min guard Stair Management: One rail Right;Step to pattern;Forwards Number of Stairs: 2 General stair comments: min guard for safety; pt very slow and cautious  Wheelchair Mobility    Modified Rankin (Stroke Patients Only)       Balance Overall balance assessment: Needs assistance Sitting-balance support: Feet supported Sitting balance-Leahy Scale: Good     Standing balance support: No upper extremity supported Standing balance-Leahy Scale: Fair                               Pertinent Vitals/Pain Pain Assessment: No/denies pain    Home Living Family/patient expects to be discharged to:: Private residence Living Arrangements: Spouse/significant other Available Help at Discharge: Family;Available 24 hours/day Type of Home: House Home Access: Stairs to enter Entrance Stairs-Rails: None Entrance Stairs-Number of Steps: 2 Home Layout: One level Home Equipment: Grab bars - tub/shower;Shower seat      Prior Function Level of Independence: Independent         Comments: has been taking care of her husband who recently had a stroke     Hand Dominance        Extremity/Trunk Assessment   Upper Extremity Assessment Upper Extremity Assessment: Overall WFL for tasks assessed  Lower Extremity Assessment Lower Extremity Assessment: Overall WFL for tasks assessed    Cervical / Trunk Assessment Cervical / Trunk Assessment: Normal  Communication   Communication: Prefers language other than [place];No difficulties  Cognition Arousal/Alertness: Awake/alert Behavior During  Therapy: WFL for tasks assessed/performed Overall Cognitive Status: Within Functional Limits for tasks assessed                                        General Comments      Exercises     Assessment/Plan    PT Assessment Patient needs continued PT services  PT Problem List Decreased balance;Decreased mobility;Decreased coordination       PT Treatment Interventions DME instruction;Gait training;Stair training;Functional mobility training;Therapeutic activities;Therapeutic exercise;Balance training;Neuromuscular re-education;Patient/family education    PT Goals (Current goals can be found in the Care Plan section)  Acute Rehab PT Goals Patient Stated Goal: to be independent PT Goal Formulation: With patient Time For Goal Achievement: 02/14/19 Potential to Achieve Goals: Good    Frequency Min 3X/week   Barriers to discharge        Co-evaluation               AM-PAC PT "6 Clicks" Mobility  Outcome Measure Help needed turning from your back to your side while in a flat bed without using bedrails?: None Help needed moving from lying on your back to sitting on the side of a flat bed without using bedrails?: None Help needed moving to and from a bed to a chair (including a wheelchair)?: None Help needed standing up from a chair using your arms (e.g., wheelchair or bedside chair)?: A Little Help needed to walk in hospital room?: A Little Help needed climbing 3-5 steps with a railing? : A Little 6 Click Score: 21    End of Session Equipment Utilized During Treatment: Gait belt Activity Tolerance: Patient tolerated treatment well Patient left: with call bell/phone within reach;Other (comment)(seated on toilet - requesting to sit for a while) Nurse Communication: Mobility status PT Visit Diagnosis: Other abnormalities of gait and mobility (R26.89);Other symptoms and signs involving the nervous system (R29.898)    Time: 5929-2446 PT Time Calculation (min)  (ACUTE ONLY): 33 min   Charges:   PT Evaluation $PT Eval Moderate Complexity: 1 Mod PT Treatments $Gait Training: 8-22 mins        Sherie Don, [place], DPT  Acute Rehabilitation Services Pager [phone] Office [place] 01/31/2019, 2:29 PM

## 2019-01-31 NOTE — Evaluation (Signed)
Occupational Therapy Evaluation Patient Details Name: Erica Young MRN: [id number] DOB: December 25, 1940 Today's Date: 01/31/2019    History of Present Illness Pt is a 78 y/o female admitted secondary to L sided weakness and facial droop. 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 PMH including but not limited to metastatic breast cancer, anxiety, patent foramen ovale, hypertension, hyperlipidemia, chronic headaches.   Clinical Impression   Pt admitted with above diagnoses, with decreased activity tolerance and mild cognitive deficits limiting ability to engage in BADL at desired level of ind. PTA pt ind and caring for husband who had a stroke. At time of eval, she is supervision for transfers and functional mobility. Pt endorses some deficits with STM at baseline, not able to recall room number at end of session. She also shares that she "sees animals and people" when she closes her eyes. At this time no OT follow up indicated. Will continue to follow acutely per POC listed below.     Follow Up Recommendations  No OT follow up    Equipment Recommendations  None recommended by OT    Recommendations for Other Services       Precautions / Restrictions Precautions Precautions: Fall Restrictions Weight Bearing Restrictions: No      Mobility Bed Mobility Overal bed mobility: Modified Independent                Transfers Overall transfer level: Needs assistance Equipment used: None Transfers: Sit to/from Stand Sit to Stand: Supervision         General transfer comment: supervision for safety    Balance Overall balance assessment: Needs assistance Sitting-balance support: Feet supported Sitting balance-Leahy Scale: Good     Standing balance support: No upper extremity supported Standing balance-Leahy Scale: Fair                             ADL either performed or assessed with  clinical judgement   ADL Overall ADL's : Needs assistance/impaired Eating/Feeding: Set up;Sitting   Grooming: Set up;Sitting   Upper Body Bathing: Set up;Sitting   Lower Body Bathing: Set up;Sitting/lateral leans;Sit to/from stand   Upper Body Dressing : Set up;Sitting   Lower Body Dressing: Set up;Sit to/from stand;Sitting/lateral leans   Toilet Transfer: Set up;Regular Toilet   Toileting- Clothing Manipulation and Hygiene: Sitting/lateral lean;Set up;Sit to/from stand   Tub/ Shower Transfer: Supervision/safety;Shower seat;Ambulation   Functional mobility during ADLs: Supervision/safety General ADL Comments: ltd by generalized weakness and minor cognitive deficits     Vision Baseline Vision/History: No visual deficits Patient Visual Report: No change from baseline       Perception     Praxis      Pertinent Vitals/Pain Pain Assessment: No/denies pain     Hand Dominance     Extremity/Trunk Assessment Upper Extremity Assessment Upper Extremity Assessment: Overall WFL for tasks assessed   Lower Extremity Assessment Lower Extremity Assessment: Overall WFL for tasks assessed   Cervical / Trunk Assessment Cervical / Trunk Assessment: Normal   Communication Communication Communication: Prefers language other than [place];No difficulties   Cognition Arousal/Alertness: Awake/alert Behavior During Therapy: WFL for tasks assessed/performed Overall Cognitive Status: Impaired/Different from baseline Area of Impairment: Memory                     Memory: Decreased short-term memory         General Comments:  pt reports "seeing animals" when she closes her eyes and "different people" she does not know. Also endorses her memory getting somewhat worse   General Comments       Exercises     Shoulder Instructions      Home Living Family/patient expects to be discharged to:: Private residence Living Arrangements: Spouse/significant other Available Help  at Discharge: Family;Available 24 hours/day Type of Home: House Home Access: Stairs to enter [employer] of Steps: 2 Entrance Stairs-Rails: None Home Layout: One level     Bathroom Shower/Tub: Teacher, early years/pre: Handicapped height     Home Equipment: Grab bars - tub/shower;Shower seat          Prior Functioning/Environment Level of Independence: Independent        Comments: has been taking care of her husband who recently had a stroke        OT Problem List: Decreased strength;Decreased knowledge of use of DME or AE;Decreased activity tolerance;Pain      OT Treatment/Interventions: Self-care/ADL training;Therapeutic exercise;Neuromuscular education;Energy conservation;Therapeutic activities;DME and/or AE instruction;Patient/family education    OT Goals(Current goals can be found in the care plan section) Acute Rehab OT Goals Patient Stated Goal: to be independent OT Goal Formulation: With patient Time For Goal Achievement: 02/14/19 Potential to Achieve Goals: Good  OT Frequency: Min 2X/week   Barriers to D/C:            Co-evaluation              AM-PAC OT "6 Clicks" Daily Activity     Outcome Measure Help from another person eating meals?: None Help from another person taking care of personal grooming?: None Help from another person toileting, which includes using toliet, bedpan, or urinal?: None Help from another person bathing (including washing, rinsing, drying)?: A Little Help from another person to put on and taking off regular upper body clothing?: None Help from another person to put on and taking off regular lower body clothing?: A Little 6 Click Score: 22   End of Session Equipment Utilized During Treatment: Rolling walker Nurse Communication: Mobility status  Activity Tolerance: Patient tolerated treatment well Patient left: in bed;with call bell/phone within reach;with bed alarm set  OT Visit Diagnosis:  Unsteadiness on feet (R26.81);Other abnormalities of gait and mobility (R26.89);Muscle weakness (generalized) (M62.81);History of falling (Z91.81);Pain Pain - part of body: (facial pain)                Time: 0865-7846 OT Time Calculation (min): 32 min Charges:  OT General Charges $OT Visit: 1 Visit OT Evaluation $OT Eval Moderate Complexity: 1 Mod OT Treatments $Self Care/Home Management : 8-22 mins  Zenovia Jarred, MSOT, OTR/L Behavioral Health OT/ Acute Relief OT [facility] Office: [phone]   Zenovia Jarred 01/31/2019, 4:55 PM

## 2019-02-01 LAB — BASIC METABOLIC PANEL
Anion gap: 8 (ref 5–15)
BUN: 12 mg/dL (ref 8–23)
CO2: 25 mmol/L (ref 22–32)
Calcium: 8.5 mg/dL — ABNORMAL LOW (ref 8.9–10.3)
Chloride: 105 mmol/L (ref 98–111)
Creatinine, Ser: 0.76 mg/dL (ref 0.44–1.00)
GFR calc Af Amer: >60 mL/min (ref >60)
GFR calc non Af Amer: >60 mL/min (ref >60)
Glucose, Bld: 101 mg/dL — ABNORMAL HIGH (ref 70–99)
Potassium: 4.1 mmol/L (ref 3.5–5.1)
Sodium: 138 mmol/L (ref 135–145)

## 2019-02-01 LAB — CBC
HCT: 35.6 % — ABNORMAL LOW (ref 36.0–46.0)
Hemoglobin: 12.5 g/dL (ref 12.0–15.0)
MCH: 38.5 pg — ABNORMAL HIGH (ref 26.0–34.0)
MCHC: 35.1 g/dL (ref 30.0–36.0)
MCV: 109.5 fL — ABNORMAL HIGH (ref 80.0–100.0)
Platelets: 290 10*3/uL (ref 150–400)
RBC: 3.25 MIL/uL — ABNORMAL LOW (ref 3.87–5.11)
RDW: 12.4 % (ref 11.5–15.5)
WBC: 2.7 10*3/uL — ABNORMAL LOW (ref 4.0–10.5)
nRBC: 0 % (ref 0.0–0.2)

## 2019-02-01 MED ORDER — ENSURE ENLIVE PO LIQD
237.0000 mL | Freq: Two times a day (BID) | ORAL | Status: DC
  Administered 2019-02-02: 237 mL via ORAL

## 2019-02-01 MED ORDER — AMOXICILLIN 500 MG PO CAPS
500.0000 mg | ORAL_CAPSULE | Freq: Three times a day (TID) | ORAL | Status: DC
  Administered 2019-02-01 – 2019-02-02 (×2): 500 mg via ORAL
  Filled 2019-02-01 (×2): qty 1

## 2019-02-01 MED ORDER — ENOXAPARIN SODIUM 40 MG/0.4ML ~~LOC~~ SOLN
40.0000 mg | SUBCUTANEOUS | Status: DC
  Administered 2019-02-01 – 2019-02-03 (×3): 40 mg via SUBCUTANEOUS
  Filled 2019-02-01 (×3): qty 0.4

## 2019-02-01 MED ORDER — METRONIDAZOLE 500 MG PO TABS
500.0000 mg | ORAL_TABLET | Freq: Two times a day (BID) | ORAL | Status: AC
  Administered 2019-02-01 – 2019-02-02 (×2): 500 mg via ORAL
  Filled 2019-02-01 (×2): qty 1

## 2019-02-01 MED ORDER — FLUTICASONE PROPIONATE 50 MCG/ACT NA SUSP
2.0000 | Freq: Every day | NASAL | Status: DC
  Filled 2019-02-01: qty 16

## 2019-02-01 NOTE — Progress Notes (Addendum)
Occupational Therapy Treatment Patient Details Name: Erica Young MRN: [id number] DOB: [birth year] Today's Date: 02/01/2019    History of present illness Pt is a 78 y/o female admitted secondary to L sided weakness and facial droop. 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 PMH including but not limited to metastatic breast cancer, anxiety, patent foramen ovale, hypertension, hyperlipidemia, chronic headaches.   OT comments  Pt. Seen for skilled OT.  Pt. C/o how her head feels.  Difficulty describing it, cont. To repeat over and over that "she feels like her head is in a cave" did not want to say it was 100% dizziness because this felt "different".  Very concerned for the future. Almost tearful at times. States she does not feel she can take care of her husband anymore. Concerned how she will even care for herself.  3 attempts for sit/stand due to how her head was feeling.  Attempted higher level ADL pt. States she is unable to complete this secondary to "feelilng so tired and weak".  rn notified for sw consult to navigate d/c needs. Pt. Reports it is a 2nd marriage she has 2 sons and her spouse has 1 son. Most of these children live out of state also.  Will alert otr/l likely follow up/dc recommendations to be updated.   Follow Up Recommendations  No OT follow up -needs to be updated pending sw consult and d/c plans   Equipment Recommendations  None recommended by OT    Recommendations for Other Services      Precautions / Restrictions Precautions Precautions: Fall       Mobility Bed Mobility Overal bed mobility: Modified Independent                Transfers Overall transfer level: Needs assistance Equipment used: None Transfers: Sit to/from [employer] Transfers Sit to Stand: Supervision Stand pivot transfers: Supervision       General transfer comment: 3 attempts at sit/stand due to  feeling dizzy and uneasy    Balance                                           ADL either performed or assessed with clinical judgement   ADL Overall ADL's : Needs assistance/impaired     Grooming: Wash/dry hands;Standing;Minimal assistance                                 General ADL Comments: reports feeling "so tired and weak" required 3 attempts at sit/stand due to not feeling well in standing.  attempted higher leve BADLS. wiping and washing counter around sink. pt. unable for full completion states "see even with this i am so tired, i just feel so tired and weak"     Vision       Perception     Praxis      Cognition Arousal/Alertness: Awake/alert Behavior During Therapy: Anxious Overall Cognitive Status: Impaired/Different from baseline                                 General Comments: very concerned today about her future and her husbands due to how she is feeling and unable to perform in the way  she previously did        Exercises     Shoulder Instructions       General Comments  reports 2nd marriage.  Husband has son that lives in [place]. that has been temp. Helping while she is in hospital. She reports having 2 sons. One local and one in [place]. she is a retired Education officer, museum.  She reports feeling overwhelmed with worry about her own care and her husbands.     Pertinent Vitals/ Pain       Pain Assessment: No/denies pain(denies pain but very concerned with how her head feels "in a cave" "some dizziness")  Home Living                                          Prior Functioning/Environment              Frequency  Min 2X/week        Progress Toward Goals  OT Goals(current goals can now be found in the care plan section)  Progress towards OT goals: Progressing toward goals     Plan      Co-evaluation                 AM-PAC OT "6 Clicks" Daily Activity     Outcome Measure    Help from another person eating meals?: None Help from another person taking care of personal grooming?: None Help from another person toileting, which includes using toliet, bedpan, or urinal?: None Help from another person bathing (including washing, rinsing, drying)?: A Little Help from another person to put on and taking off regular upper body clothing?: None Help from another person to put on and taking off regular lower body clothing?: A Little 6 Click Score: 22    End of Session    OT Visit Diagnosis: Unsteadiness on feet (R26.81);Other abnormalities of gait and mobility (R26.89);Muscle weakness (generalized) (M62.81);History of falling (Z91.81);Pain   Activity Tolerance Other (comment)(limited by dizziness/woozy)   Patient Left in bed;with call bell/phone within reach;with bed alarm set   Nurse Communication  spoke to rn regarding pts. Concerns and comments. He states he will put in for sw consult to assist pt. With d/c planning.         Time: 1211-1232 OT Time Calculation (min): 21 min  Charges: OT General Charges $OT Visit: 1 Visit OT Treatments $Self Care/Home Management : 8-22 mins   Janice Coffin, COTA/L 02/01/2019, 1:01 PM

## 2019-02-01 NOTE — TOC Initial Note (Signed)
Transition of Care [facility]) - Initial/Assessment Note    Patient Details  Name: Erica Young MRN: [id number] Date of Birth: [birth year]  Transition of Care [facility]) CM/SW Contact:    Carles Collet, RN Phone Number: 02/01/2019, 3:29 PM  Clinical Narrative:                Spoke w patient at the bedside. She states that she lives at home w her spouse who is recovering from CA and a recent neurological event and using a walker. She states she is his primary caregiver. She states that their son is in from [place] until tomorrow. She is concerned about returning back home with her headaches. She is agreeable to [facility] services, is declining DME needs. Discussed [facility] home first program and she was interested in this and referral was accepted by [place].  TOC will continue to follow..    Expected Discharge Plan: [place] Barriers to Discharge: Continued Medical Work up   Patient Goals and CMS Choice Patient states their goals for this hospitalization and ongoing recovery are:: to go home CMS Medicare.gov Compare Post Acute Care list provided to:: Patient Choice offered to / list presented to : Patient  Expected Discharge Plan and Services Expected Discharge Plan: [place]   Discharge Planning Services: CM Consult Post Acute Care Choice: [place] arrangements for the past 2 months: Single Family Home Expected Discharge Date: (unknown)                         HH Arranged: RN, PT, OT, Nurse's Aide HH Agency: [facility] Date [facility] Agency Contacted: 02/01/19 Time HH Agency Contacted: 106 Representative spoke with at [place]: Tommi Rumps  Prior Living Arrangements/Services Living arrangements for the past 2 months: [place] with:: Spouse Patient language and need for interpreter reviewed:: Yes Do you feel safe going back to the place where you live?: Yes            Criminal Activity/Legal Involvement Pertinent to Current  Situation/Hospitalization: No - Comment as needed  Activities of Daily Living Home Assistive Devices/Equipment: Eyeglasses ADL Screening (condition at time of admission) Patient's cognitive ability adequate to safely complete daily activities?: Yes Is the patient deaf or have difficulty hearing?: No Does the patient have difficulty seeing, even when wearing glasses/contacts?: No Does the patient have difficulty concentrating, remembering, or making decisions?: No Patient able to express need for assistance with ADLs?: Yes Does the patient have difficulty dressing or bathing?: No Independently performs ADLs?: No Communication: Independent Dressing (OT): Needs assistance Is this a change from baseline?: Change from baseline, expected to last <3days Grooming: Needs assistance Is this a change from baseline?: Change from baseline, expected to last <3 days Feeding: Needs assistance Is this a change from baseline?: Change from baseline, expected to last <3 days Bathing: Needs assistance Is this a change from baseline?: Change from baseline, expected to last <3 days Toileting: Needs assistance Is this a change from baseline?: Change from baseline, expected to last <3 days In/Out Bed: Needs assistance Is this a change from baseline?: Change from baseline, expected to last <3 days Walks in Home: Needs assistance Is this a change from baseline?: Change from baseline, expected to last <3 days Does the patient have difficulty walking or climbing stairs?: Yes(secondary to weakness) Weakness of Legs: Both Weakness of Arms/Hands: None  Permission Sought/Granted  Emotional Assessment              Admission diagnosis:  Acute left-sided weakness [R53.1] Patient Active Problem List   Diagnosis Date Noted  . Acute left-sided weakness 01/29/2019  . Leukopenia due to antineoplastic chemotherapy ([place]) 01/29/2019  . Anxiety 01/29/2019  . Aortic atherosclerosis ([place])  01/03/2018  . History of epistaxis 10/15/2017  . GI symptoms/possible food intolerance 06/18/2017  . Malignant neoplasm of upper-outer quadrant of right breast in female, estrogen receptor positive ([place]) 01/19/2016  . Bone metastases ([place]) 11/22/2014  . Perennial allergic rhinitis with a nonallergic component 02/12/2014  . Cancer of right breast, stage 4 ([place]) 01/23/2014  . History of breast cancer 01/11/2014  . Chronic rhinitis 09/17/2013  . Hilar adenopathy 05/12/2013  . Abdominal pain 05/07/2011  . Hypertension 06/09/2010  . BACK PAIN 04/10/2010  . Dyslipidemia 03/03/2010  . Cerebrovascular disease 03/03/2010  . DIVERTICULOSIS, COLON 03/03/2010   PCP:  Isaac Bliss, Rayford Halsted, MD Pharmacy:   [facility] [address], [address] AT [place] [address] Phone: [phone] Fax: [phone]  [place], [place] [address] Phone: [phone] Fax: [phone]     Social Determinants of Health (SDOH) Interventions    Readmission Risk Interventions No flowsheet data found.

## 2019-02-01 NOTE — Progress Notes (Signed)
PO abx entered by self per telephone order from MD [facility]

## 2019-02-01 NOTE — Progress Notes (Signed)
PROGRESS NOTE  Erica Young WUJ:[id number] DOB: 31-Mar-1941 DOA: 01/29/2019 PCP: Isaac Bliss, Rayford Halsted, MD  HPI/Recap of past 62 hours:  78 year old extremely anxious female with metastatic breast cancer, extensive bony metastasis was at [facility] cancer center yesterday, following her Faslodex injection which she has received multiple times in the past she reported sudden onset severe headache, dizziness followed by loss of consciousness, this lasted around 2 minutes when she regained consciousness she was noted to have a left facial droop, mild slurring and left arm, leg weakness. -Code stroke was activated, patient sent to the emergency room, she was seen by neurology, MRI was ordered, not felt to be a TPA candidate due to known intracranial lesion.  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   02/01/19: Patient was seen and examined at her bedside this morning.  No acute events overnight.  Reports nasal stuffiness.  Will start Flonase and humidified O2.  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  No sign of acute brain infarct. We will contact Dr. Jana Hakim and discuss findings. Continue supportive management  Chronic nasal stuffiness in the setting of left nasal cavity mass Start Flonase for  symptomatic management and humidified oxygen supplement.  Acute systolic CHF in the setting of chronic diastolic CHF 2D echo done on 01/30/2019 showed LVEF 30 to 35% with impaired relaxation of left ventricular diastolic Doppler parameters Low BNP and euvolemic on exam  Seen by cardiology, highly appreciated. 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 Jana Hakim Gets Faslodex infusions, also on Ibrance-this is resumed, advised son to bring this from home -Will notify Dr. Jana Hakim of admission Continue supportive care  Resolving left upper and left lower extremity weakness/reported dysarthria or aphasia, unclear etiology Negative MRI brain for acute infarct LDL 170 on 01/30/2019 A1c 5.7 on 01/30/19. PT OT to assess Fall precautions  Leukopenia likely secondary to recent chemotherapy No sign of active infective process Wbc improving 2.7 on 02/01/19 from 2.3 on 01/29/19. Continue to monitor  Severe anxiety Continue Xanax as needed per home regimen  Recent dental infection Completed 10 days of  amoxicillin 500 mg TID, flagyl po  500 mg BID on 02/01/19.  Physical debility PT OT to assess Out of bed to chair with assistance and fall precautions Encourage increase oral intake Start Ensure supplement  Severe protein calorie malnutrition in the setting of stage IV breast cancer with mets Start oral supplement Dietary consult Encourage increase oral protein calorie intake  DVT prophylaxis: Sq lovenox daily Code Status: DNR Family Communication:  None at bedside   Disposition Plan:  Patient is currently not appropriate for discharge at this time due to newly diagnosed acute systolic CHF in the setting of chronic diastolic  CHF, multiple comorbidities and advanced  age.  Consultants:  Neurology Cardiology    Procedures:   Antimicrobials:    Objective: Vitals:   02/01/19 0305 02/01/19 0500 02/01/19 0812 02/01/19 1100  BP: 137/63  114/77 112/80  Pulse: 89  94   Resp: 18  16   Temp: 98.3 F (36.8 C)  98.2 F (36.8 C) 98.5 F (36.9 C)  TempSrc: Oral  Oral Oral  SpO2: 98%  94%   Weight:  61.4 kg      Intake/Output Summary (Last 24 hours) at 02/01/2019 1447 Last data filed at 02/01/2019 0306 Gross per 24 hour  Intake --  Output 400 ml  Net -400 ml   Filed Weights   02/01/19 0500  Weight: 61.4 kg    Exam:   General: 78 y.o. year-old female frail-appearing no acute distress.  Alert and oriented x3.  Cardiovascular: Regular rate and rhythm no rubs or gallops no JVD or thyromegaly noted.    Respiratory: Clear to auscultation no wheezes or rales.  Poor inspiratory effort.  Abdomen: Soft nontender nondistended normal bowel sounds present.  Musculoskeletal: No lower extremity edema.  Palpable pulses in all 4 extremities.  Psychiatry: Mood is appropriate for condition and setting.   Data Reviewed: CBC: Recent Labs  Lab 01/29/19 0855 01/29/19 1047 01/29/19 1048 02/01/19 0643  WBC 2.3*  --  2.3* 2.7*  NEUTROABS 1.3*  --  1.3*  --   HGB 12.2 11.9* 12.6 12.5  HCT 36.0 35.0* 37.8 35.6*  MCV 112.5*  --  112.5* 109.5*  PLT 294  --  296 454   Basic Metabolic Panel: Recent Labs  Lab 01/29/19 0855 01/29/19 1047 01/29/19 1048 02/01/19 0643  NA 139 138 136 138  K 4.6 4.1 4.6 4.1  CL 104 106 104 105  CO2 28  --  24 25  GLUCOSE 102* 90 97 101*  BUN 20 19 20 12   CREATININE 0.77 0.60 0.72 0.76  CALCIUM 9.1  --  8.8* 8.5*   GFR: Estimated Creatinine Clearance: 50 mL/min (by C-G formula based on SCr of 0.76 mg/dL). Liver Function Tests: Recent Labs  Lab 01/29/19 0855 01/29/19 1048  AST 16 28  ALT 13 16  ALKPHOS 82 73  BILITOT 0.3 0.6  PROT 7.2 7.1  ALBUMIN 3.7 4.0   No results for input(s): LIPASE, AMYLASE in  the last 168 hours. No results for input(s): AMMONIA in the last 168 hours. Coagulation Profile: Recent Labs  Lab 01/29/19 1048  INR 1.1   Cardiac Enzymes: No results for input(s): CKTOTAL, CKMB, CKMBINDEX, TROPONINI in the last 168 hours. BNP (last 3 results) No results for input(s): PROBNP in the last 8760 hours. HbA1C: Recent Labs    01/30/19 0647  HGBA1C 5.7*   CBG: Recent Labs  Lab 01/29/19 1037 01/31/19 1207  GLUCAP 96 117*   Lipid Profile: Recent Labs    01/30/19 0647  CHOL 145  HDL 62  LDLCALC 70  TRIG 64  CHOLHDL 2.3   Thyroid Function Tests: No results for input(s): TSH, T4TOTAL, FREET4, T3FREE, THYROIDAB in the last 72 hours. Anemia Panel: No results for input(s): VITAMINB12, FOLATE, FERRITIN, TIBC, IRON, RETICCTPCT in the last 72 hours. Urine analysis:    Component Value Date/Time   COLORURINE STRAW (A) 11/19/2016 1750   APPEARANCEUR CLEAR 11/19/2016 1750   LABSPEC 1.003 (L) 11/19/2016 1750   PHURINE 7.0 11/19/2016 1750   GLUCOSEU NEGATIVE 11/19/2016 1750   HGBUR NEGATIVE 11/19/2016 1750   BILIRUBINUR NEGATIVE 11/19/2016  1750   BILIRUBINUR neg 02/16/2015 1318   KETONESUR NEGATIVE 11/19/2016 1750   PROTEINUR NEGATIVE 11/19/2016 1750   UROBILINOGEN 0.2 02/16/2015 1318   UROBILINOGEN 0.2 11/18/2013 1321   NITRITE NEGATIVE 11/19/2016 1750   LEUKOCYTESUR TRACE (A) 11/19/2016 1750   Sepsis Labs: @LABRCNTIP (procalcitonin:4,lacticidven:4)  ) Recent Results (from the past 240 hour(s))  SARS CORONAVIRUS 2 (TAT 6-24 HRS) Nasopharyngeal Nasopharyngeal Swab     Status: None   Collection Time: 01/29/19  1:44 PM   Specimen: Nasopharyngeal Swab  Result Value Ref Range Status   SARS Coronavirus 2 NEGATIVE NEGATIVE Final    Comment: (NOTE) SARS-CoV-2 target nucleic acids are NOT DETECTED. The SARS-CoV-2 RNA is generally detectable in upper and lower respiratory specimens during the acute phase of infection. Negative results do not preclude SARS-CoV-2  infection, do not rule out co-infections with other pathogens, and should not be used as the sole basis for treatment or other patient management decisions. Negative results must be combined with clinical observations, patient history, and epidemiological information. The expected result is Negative. Fact Sheet for Patients: SugarRoll.be Fact Sheet for Healthcare Providers: https://www.woods-mathews.com/ This test is not yet approved or cleared by the [place] FDA and  has been authorized for detection and/or diagnosis of SARS-CoV-2 by FDA under an Emergency Use Authorization (EUA). This EUA will remain  in effect (meaning this test can be used) for the duration of the COVID-19 declaration under Section 56 4(b)(1) of the Act, 21 U.S.C. section 360bbb-3(b)(1), unless the authorization is terminated or revoked sooner. Performed at [facility], [place] [address] [place],  [address]       Studies: No results found.  Scheduled Meds:  amoxicillin  500 mg Oral Q8H   aspirin  300 mg Rectal Daily   Or   aspirin  325 mg Oral Daily   carvedilol  6.25 mg Oral BID WC   enoxaparin (LOVENOX) injection  40 mg Subcutaneous Q24H   feeding supplement (ENSURE ENLIVE)  237 mL Oral BID BM   ketorolac  30 mg Intravenous Once   loratadine  10 mg Oral Daily   metoCLOPramide (REGLAN) injection  10 mg Intravenous Once   metroNIDAZOLE  500 mg Oral BID   ondansetron  4 mg Oral Once   palbociclib  75 mg Oral Daily   prochlorperazine  10 mg Intravenous Once   sodium chloride flush  3 mL Intravenous Once   spironolactone  12.5 mg Oral Daily    Continuous Infusions:   LOS: 2 days     Kayleen Memos, MD [facility] Pager [phone]  If 7PM-7AM, please contact night-coverage [email or web address] Password [email or web address] 02/01/2019, 2:47 PM

## 2019-02-02 ENCOUNTER — Other Ambulatory Visit: Payer: Self-pay | Admitting: Oncology

## 2019-02-02 ENCOUNTER — Inpatient Hospital Stay (HOSPITAL_COMMUNITY): Payer: Medicare Other

## 2019-02-02 DIAGNOSIS — I5021 Acute systolic (congestive) heart failure: Secondary | ICD-10-CM

## 2019-02-02 DIAGNOSIS — R531 Weakness: Secondary | ICD-10-CM

## 2019-02-02 DIAGNOSIS — R0602 Shortness of breath: Secondary | ICD-10-CM

## 2019-02-02 LAB — CBC WITH DIFFERENTIAL/PLATELET
Abs Immature Granulocytes: 0 10*3/uL (ref 0.00–0.07)
Basophils Absolute: 0 10*3/uL (ref 0.0–0.1)
Basophils Relative: 0 %
Eosinophils Absolute: 0.2 10*3/uL (ref 0.0–0.5)
Eosinophils Relative: 8 %
HCT: 36 % (ref 36.0–46.0)
Hemoglobin: 13 g/dL (ref 12.0–15.0)
Lymphocytes Relative: 35 %
Lymphs Abs: 0.8 10*3/uL (ref 0.7–4.0)
MCH: 39.5 pg — ABNORMAL HIGH (ref 26.0–34.0)
MCHC: 36.1 g/dL — ABNORMAL HIGH (ref 30.0–36.0)
MCV: 109.4 fL — ABNORMAL HIGH (ref 80.0–100.0)
Monocytes Absolute: 0.1 10*3/uL (ref 0.1–1.0)
Monocytes Relative: 4 %
Neutro Abs: 1.3 10*3/uL — ABNORMAL LOW (ref 1.7–7.7)
Neutrophils Relative %: 53 %
Platelets: 280 10*3/uL (ref 150–400)
RBC: 3.29 MIL/uL — ABNORMAL LOW (ref 3.87–5.11)
RDW: 12.1 % (ref 11.5–15.5)
WBC: 2.4 10*3/uL — ABNORMAL LOW (ref 4.0–10.5)
nRBC: 0 % (ref 0.0–0.2)
nRBC: 0 /100 WBC

## 2019-02-02 MED ORDER — AMOXICILLIN 500 MG PO CAPS
1000.0000 mg | ORAL_CAPSULE | Freq: Two times a day (BID) | ORAL | Status: DC
  Filled 2019-02-02 (×3): qty 2

## 2019-02-02 MED ORDER — ALPRAZOLAM 0.25 MG PO TABS
0.2500 mg | ORAL_TABLET | Freq: Three times a day (TID) | ORAL | Status: DC
  Administered 2019-02-02 – 2019-02-04 (×8): 0.25 mg via ORAL
  Filled 2019-02-02 (×9): qty 1

## 2019-02-02 MED ORDER — IOHEXOL 300 MG/ML  SOLN
75.0000 mL | Freq: Once | INTRAMUSCULAR | Status: AC | PRN
  Administered 2019-02-02: 18:00:00 75 mL via INTRAVENOUS

## 2019-02-02 MED ORDER — ENSURE ENLIVE PO LIQD
237.0000 mL | Freq: Two times a day (BID) | ORAL | Status: DC
  Administered 2019-02-02 – 2019-02-04 (×5): 237 mL via ORAL

## 2019-02-02 MED ORDER — METRONIDAZOLE 500 MG PO TABS
500.0000 mg | ORAL_TABLET | Freq: Two times a day (BID) | ORAL | Status: DC
  Filled 2019-02-02 (×3): qty 1

## 2019-02-02 NOTE — Progress Notes (Signed)
Initial Nutrition Assessment  DOCUMENTATION CODES:   Not applicable  INTERVENTION:  Continue Ensure Enlive po BID, each supplement provides 350 kcal and 20 grams of protein.  Encourage adequate PO intake.   NUTRITION DIAGNOSIS:   Increased nutrient needs related to cancer and cancer related treatments as evidenced by estimated needs.  GOAL:   Patient will meet greater than or equal to 90% of their needs  MONITOR:   PO intake, Supplement acceptance, Skin, Weight trends, Labs, I & O's  REASON FOR ASSESSMENT:   Consult Assessment of nutrition requirement/status  ASSESSMENT:   78 year old extremely anxious female with metastatic breast cancer, extensive bony metastasis was at [facility] yesterday, following her Faslodex injection which she has received multiple times in the past she reported sudden onset severe headache, dizziness followed by loss of consciousness, this lasted around 2 minutes when she regained consciousness she was noted to have a left facial droop, mild slurring and left arm, leg weakness.   MRI revealed no sign of acute brain infarct. Pt with left nasal cavity mass measuring 5 cm extending to the cribriform plate. Pt with generalized weakness with severe debility in the setting of advanced stage IV breast cancer with metastasis and acute systolic CHF in the setting of chronic diastolic CHF  Meal completion has been 75%. Pt reports having a good appetite currently and PTA with usual consumption of at least 3 meals a day with Ensure shakes 1-2 daily. Pt with no significant weight loss per weight records. Pt currently has Ensure ordered and has been consuming them. RD to continue with current orders to aid in adequate caloric and protein needs. Pt encouraged to eat her food at meals and to drink her supplements.   NUTRITION - FOCUSED PHYSICAL EXAM:    Most Recent Value  Orbital Region  No depletion  Upper Arm Region  No depletion  Thoracic and  Lumbar Region  No depletion  Buccal Region  Unable to assess  Temple Region  Unable to assess  Clavicle Bone Region  Mild depletion  Clavicle and Acromion Bone Region  Mild depletion  Scapular Bone Region  No depletion  Dorsal Hand  No depletion  Patellar Region  No depletion  Anterior Thigh Region  No depletion  Posterior Calf Region  No depletion  Edema (RD Assessment)  None  Hair  Reviewed  Eyes  Reviewed  Mouth  Reviewed  Skin  Reviewed  Nails  Reviewed       Diet Order:   Diet Order            Diet Heart Room service appropriate? Yes; Fluid consistency: Thin  Diet effective now              EDUCATION NEEDS:   Education needs have been addressed  Skin:  Skin Assessment: Reviewed RN Assessment  Last BM:  Unknown  Height:   Ht Readings from Last 1 Encounters:  02/02/19 5\' 2"  (1.575 m)    Weight:   Wt Readings from Last 1 Encounters:  02/01/19 61.4 kg    Ideal Body Weight:  50 kg  BMI:  Body mass index is 24.76 kg/m.  Estimated Nutritional Needs:   Kcal:  1700-1900  Protein:  80-95 grams  Fluid:  >/= 1.7 L/day    Corrin Parker, MS, RD, LDN Pager # [phone] After hours/ weekend pager # [phone]

## 2019-02-02 NOTE — Progress Notes (Signed)
Occupational Therapy Treatment Patient Details Name: Erica Young MRN: [id number] DOB: 1941-04-04 Today's Date: 02/02/2019    History of present illness Pt is a 78 y/o female admitted secondary to L sided weakness and facial droop. 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 PMH including but not limited to metastatic breast cancer, anxiety, patent foramen ovale, hypertension, hyperlipidemia, chronic headaches.   OT comments  Pt continues to progress towards OT goals. Pt presenting with decreased strength, balance, activity tolerance, and increased pain and anxiety. Pt performed functional mobility in hallway to simulate home distances and simulated toilet transfer with min A and HHA for safety, balance, and fear of falling. Updated dc recommendation to [place]. Will follow acutely as admitted.    Follow Up Recommendations  Home health OT    Equipment Recommendations  3 in 1 bedside commode;Other (comment)(RW)    Recommendations for Other Services PT consult    Precautions / Restrictions Precautions Precautions: Fall Restrictions Weight Bearing Restrictions: No       Mobility Bed Mobility Overal bed mobility: Modified Independent                Transfers Overall transfer level: Needs assistance Equipment used: None Transfers: Sit to/from Stand Sit to Stand: Min assist         General transfer comment: pt attempted to stand 4-5 times, required min A to gain balance to maintain standing. pt reported weakness in her legs preventing her from standing    Balance Overall balance assessment: Needs assistance Sitting-balance support: Feet supported Sitting balance-Leahy Scale: Good     Standing balance support: Single extremity supported;During functional activity Standing balance-Leahy Scale: Poor Standing balance comment: Pt required HHA to maintain balance                            ADL either performed or assessed with clinical judgement   ADL Overall ADL's : Needs assistance/impaired                     Lower Body Dressing: Supervision/safety;Sit to/from stand Lower Body Dressing Details (indicate cue type and reason): pt adjusted socks while sitting EOB with supervision for safety Toilet Transfer: Ambulation;Minimal assistance(simulated to recliner) Toilet Transfer Details (indicate cue type and reason): Simulated to recliner. Required min A and HHA for safety and balance         Functional mobility during ADLs: Minimal assistance General ADL Comments: Pt performed functional mobility and simulated toilet transfer with min A and HHA for safety and balance. Pt reported feeling tired, sore, and weak in legs     Vision       Perception     Praxis      Cognition Arousal/Alertness: Awake/alert Behavior During Therapy: Anxious Overall Cognitive Status: Within Functional Limits for tasks assessed                                 General Comments: Pt appears very anxious regarding her ability to care for herself and husband at home. Pt reports "seeing faces" when she closes her eyes.        Exercises     Shoulder Instructions       General Comments Pt anxious for how she will care for self and husband once dc'd    Pertinent Vitals/ Pain  Pain Assessment: Faces Faces Pain Scale: Hurts a little bit Pain Location: legs Pain Descriptors / Indicators: Discomfort;Sore Pain Intervention(s): Monitored during session;Repositioned  Home Living                                          Prior Functioning/Environment              Frequency  Min 2X/week        Progress Toward Goals  OT Goals(current goals can now be found in the care plan section)  Progress towards OT goals: Progressing toward goals  Acute Rehab OT Goals Patient Stated Goal: to be independent OT Goal Formulation: With  patient Time For Goal Achievement: 02/14/19 Potential to Achieve Goals: Good ADL Goals Pt Will Perform Lower Body Bathing: with modified independence;sit to/from stand;sitting/lateral leans Pt Will Perform Lower Body Dressing: with modified independence;sitting/lateral leans;sit to/from stand Pt Will Transfer to Toilet: with modified independence;ambulating;regular height toilet Pt Will Perform Tub/Shower Transfer: with modified independence;shower seat;ambulating Additional ADL Goal #1: Pt will recall and/or apply 1-3 ECS strategies to BADL activity Additional ADL Goal #2: Pt will complete multistep cognitive task with no errors  Plan Discharge plan needs to be updated;Frequency remains appropriate;Equipment recommendations need to be updated    Co-evaluation                 AM-PAC OT "6 Clicks" Daily Activity     Outcome Measure   Help from another person eating meals?: None Help from another person taking care of personal grooming?: None Help from another person toileting, which includes using toliet, bedpan, or urinal?: A Little Help from another person bathing (including washing, rinsing, drying)?: A Little Help from another person to put on and taking off regular upper body clothing?: None Help from another person to put on and taking off regular lower body clothing?: A Little 6 Click Score: 21    End of Session Equipment Utilized During Treatment: Gait belt  OT Visit Diagnosis: Unsteadiness on feet (R26.81);Other abnormalities of gait and mobility (R26.89);Muscle weakness (generalized) (M62.81);Pain   Activity Tolerance Patient tolerated treatment well;Other (comment)(pt c/o weakness throughout duration of session)   Patient Left in chair;with call bell/phone within reach;with chair alarm set;with family/visitor present   Nurse Communication Mobility status        Time: 7793-9030 OT Time Calculation (min): 25 min  Charges: OT General Charges $OT Visit: 1  Visit OT Treatments $Self Care/Home Management : 8-22 mins $Therapeutic Activity: 8-22 mins  Gus Rankin, OT Student  Di Kindle Tannia Contino 02/02/2019, 5:00 PM

## 2019-02-02 NOTE — Progress Notes (Signed)
Spoke with patient's son, Delfino Lovett, who states he has been giving patient her Ibrance chemotherapy medication everyday when he comes to visit. States he was told in [place] ED that this practice would be acceptable. Has not notified patient's nurse on the unit or the patient's current attending physician.  Will notify pharmacy and MD regarding this information and request MD to see patient's son when he comes to visit today. However, oncology MD note recommends a "vacation" from this medication.  Will continue to monitor. Wendee Copp

## 2019-02-02 NOTE — TOC Progression Note (Signed)
Transition of Care [facility]) - Progression Note    Patient Details  Name: Madeeha Costantino MRN: [id number] Date of Birth: 19-Jun-1940  Transition of Care [facility]) CM/SW Contact  Pollie Friar, RN Phone Number: 02/02/2019, 4:30 PM  Clinical Narrative:    CM provided bed offers to the patient and her son: Delfino Lovett. [employer] selected and accepted. Pt will need a new covid test prior to d/c. MD is aware. Pt's PASAR also went to manual review. Information sent to [place] must. TOC following.   Expected Discharge Plan: [place] Barriers to Discharge: Continued Medical Work up  Expected Discharge Plan and Services Expected Discharge Plan: [place]   Discharge Planning Services: CM Consult Post Acute Care Choice: [place] arrangements for the past 2 months: Single Family Home Expected Discharge Date: (unknown)                         HH Arranged: RN, PT, OT, Nurse's Aide [place] Agency: [place] Date [facility] Agency Contacted: 02/01/19 Time [place]: [id number] Representative spoke with at [place]: [place] ([place]) Interventions    Readmission Risk Interventions No flowsheet data found.

## 2019-02-02 NOTE — Care Management Important Message (Signed)
Important Message  Patient Details  Name: Erica Young MRN: [id number] Date of Birth: [birth year]   Medicare Important Message Given:  Yes     Erica Young 02/02/2019, 3:47 PM

## 2019-02-02 NOTE — Progress Notes (Signed)
Erica Young   DOB:03/24/1941   ZS#:[id number]   DDU#:[id number]  Subjective:  Erica Young feels "better"but is still easily exhausted. Erica Young is concerned that "they" will expect Erica Young to care for Erica Young, and Erica Young "just can't." He does have a caregiver that comes part-time. Erica Young thought Erica Young was "ready to go," and thought Erica Young would go to inpatient Hospice, but that is not an option as discussed below. At this point it appears Erica Young will be discharged to home with maximal support. No family in room   Objective: older latin woman examined in bed Vitals:   02/02/19 0412 02/02/19 0755  BP: 134/74 125/72  Pulse: 91 96  Resp: 17 18  Temp: 97.7 F (36.5 C) 97.7 F (36.5 C)  SpO2: 92% 96%    Body mass index is 24.76 kg/m.  Intake/Output Summary (Last 24 hours) at 02/02/2019 0811 Last data filed at 02/02/2019 0413 Gross per 24 hour  Intake -  Output 700 ml  Net -700 ml      CBG (last 3)  Recent Labs    01/31/19 1207  GLUCAP 117*     Labs:  Lab Results  Component Value Date   WBC 2.4 (L) 02/02/2019   HGB 13.0 02/02/2019   HCT 36.0 02/02/2019   MCV 109.4 (H) 02/02/2019   PLT 280 02/02/2019   NEUTROABS 1.3 (L) 02/02/2019    '@LASTCHEMISTRY' @  Urine Studies No results for input(s): UHGB, CRYS in the last 72 hours.  Invalid input(s): UACOL, UAPR, USPG, UPH, UTP, UGL, UKET, UBIL, UNIT, UROB, ULEU, UEPI, UWBC, [place], [place]  Basic Metabolic Panel: Recent Labs  Lab 01/29/19 0855 01/29/19 1047 01/29/19 1048 02/01/19 0643  NA 139 138 136 138  K 4.6 4.1 4.6 4.1  CL 104 106 104 105  CO2 28  --  24 25  GLUCOSE 102* 90 97 101*  BUN '20 19 20 12  ' CREATININE 0.77 0.60 0.72 0.76  CALCIUM 9.1  --  8.8* 8.5*   GFR Estimated Creatinine Clearance: 50 mL/min (by C-G formula based on SCr of 0.76 mg/dL). Liver Function Tests: Recent Labs  Lab 01/29/19 0855 01/29/19 1048  AST 16 28  ALT 13 16  ALKPHOS 82 73  BILITOT 0.3 0.6  PROT 7.2 7.1  ALBUMIN 3.7 4.0   No results  for input(s): LIPASE, AMYLASE in the last 168 hours. No results for input(s): AMMONIA in the last 168 hours. Coagulation profile Recent Labs  Lab 01/29/19 1048  INR 1.1    CBC: Recent Labs  Lab 01/29/19 0855 01/29/19 1047 01/29/19 1048 02/01/19 0643 02/02/19 0636  WBC 2.3*  --  2.3* 2.7* 2.4*  NEUTROABS 1.3*  --  1.3*  --  1.3*  HGB 12.2 11.9* 12.6 12.5 13.0  HCT 36.0 35.0* 37.8 35.6* 36.0  MCV 112.5*  --  112.5* 109.5* 109.4*  PLT 294  --  296 290 280   Cardiac Enzymes: No results for input(s): CKTOTAL, CKMB, CKMBINDEX, TROPONINI in the last 168 hours. BNP: Invalid input(s): POCBNP CBG: Recent Labs  Lab 01/29/19 1037 01/31/19 1207  GLUCAP 96 117*   D-Dimer No results for input(s): DDIMER in the last 72 hours. Hgb A1c No results for input(s): HGBA1C in the last 72 hours. Lipid Profile No results for input(s): CHOL, HDL, LDLCALC, TRIG, CHOLHDL, LDLDIRECT in the last 72 hours. Thyroid function studies No results for input(s): TSH, T4TOTAL, T3FREE, THYROIDAB in the last 72 hours.  Invalid input(s): FREET3 Anemia work up No results for input(s):  VITAMINB12, FOLATE, FERRITIN, TIBC, IRON, RETICCTPCT in the last 72 hours. Microbiology Recent Results (from the past 240 hour(s))  SARS CORONAVIRUS 2 (TAT 6-24 HRS) Nasopharyngeal Nasopharyngeal Swab     Status: None   Collection Time: 01/29/19  1:44 PM   Specimen: Nasopharyngeal Swab  Result Value Ref Range Status   SARS Coronavirus 2 NEGATIVE NEGATIVE Final    Comment: (NOTE) SARS-CoV-2 target nucleic acids are NOT DETECTED. The SARS-CoV-2 RNA is generally detectable in upper and lower respiratory specimens during the acute phase of infection. Negative results do not preclude SARS-CoV-2 infection, do not rule out co-infections with other pathogens, and should not be used as the sole basis for treatment or other patient management decisions. Negative results must be combined with clinical observations, patient  history, and epidemiological information. The expected result is Negative. Fact Sheet for Patients: SugarRoll.be Fact Sheet for Healthcare Providers: https://www.woods-mathews.com/ This test is not yet approved or cleared by the [place] FDA and  has been authorized for detection and/or diagnosis of SARS-CoV-2 by FDA under an Emergency Use Authorization (EUA). This EUA will remain  in effect (meaning this test can be used) for the duration of the COVID-19 declaration under Section 56 4(b)(1) of the Act, 21 U.S.C. section 360bbb-3(b)(1), unless the authorization is terminated or revoked sooner. Performed at [facility], [address] [address], [place], [place] [address]       Studies:  Ct Angio Head W/cm &/or Wo Cm  Result Date: 01/29/2019 CLINICAL DATA:  Left-sided weakness, slurred speech, headache. EXAM: CT ANGIOGRAPHY HEAD AND NECK TECHNIQUE: Multidetector CT imaging of the head and neck was performed using the standard protocol during bolus administration of intravenous contrast. Multiplanar CT image reconstructions and MIPs were obtained to evaluate the vascular anatomy. Carotid stenosis measurements (when applicable) are obtained utilizing NASCET criteria, using the distal internal carotid diameter as the denominator. CONTRAST:  165m OMNIPAQUE IOHEXOL 350 MG/ML SOLN COMPARISON:  Noncontrast head CT 11/29/2018, brain MRI 11/11/2014, MRA head neck 12/19/2009, nuclear medicine bone scan 03/17/2018 FINDINGS: CTA NECK FINDINGS Aortic arch: Common origin of the innominate and left common carotid arteries. Included portions of the aortic arch demonstrate no evidence of dissection or aneurysm. Mild atherosclerotic calcification of the visualized aortic arch. Right carotid system: CCA and ICA widely patent within the neck without significant stenosis. Left carotid system: CCA and ICA widely patent within the neck without significant stenosis.  Vertebral arteries: The right vertebral artery is slightly dominant. Streak artifact from a dense left-sided contrast bolus limits evaluation of the V1 and proximal V2 left vertebral artery. Within this limitation, the bilateral vertebral arteries are patent within the neck without significant stenosis (50% or greater). Skeleton: Mild cervical spondylosis. Known right parietal calvarial metastasis. Please refer to previous nuclear medicine bone scan 03/17/2018 for description of additional osseous metastases. Other neck: No mass or enlarged lymph nodes. The thyroid gland is mildly heterogeneous but otherwise unremarkable. Upper chest: No consolidation within the imaged lung apices. Review of the MIP images confirms the above findings CTA HEAD FINDINGS Anterior circulation: Mild scattered soft and calcified plaque within the intracranial internal carotid arteries. The intracranial internal carotid arteries are patent bilaterally without significant stenosis. Right middle and anterior cerebral arteries patent without high-grade proximal stenosis. Left middle and anterior cerebral arteries patent without high-grade proximal stenosis. 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 A tiny 1 mm aneurysm arising  from a right M2 branch vessel is also questioned (series 7, image 230) (series 9, image 63). Posterior circulation: Intracranial vertebral arteries are patent without high-grade stenosis. Basilar artery is patent without high-grade stenosis. Bilateral posterior cerebral arteries patent without high-grade proximal stenosis. Predominantly fetal origin of the right posterior cerebral artery. Venous sinuses: Within limitations of contrast timing, no convincing thrombus. Anatomic variants: None significant Other: Again demonstrated is a mass centered within the region of the left cribriform plate and left ethmoid sinuses. The  mass measures approximately 4.1 x 3.0 x 5.0 cm (AP x TV x CC) . There is associated expansile bony remodeling with osseous erosion through the cribriform plate into the intracranial compartment. There is also lateral bowing/erosion of the left lamina papyracea. The mass extends into the left nasal passage and partly into the left maxillary sinus. Associated complete a opacification of obstructed left frontal and left sphenoid sinuses. There is also extensive partial opacification of bilateral ethmoid air cells. Significant rightward deviation of the bony nasal septum. Previously this mass measured 2.8 x 2.8 x 2.8 cm on MRI 11/11/2014. Review of the MIP images confirms the above findings IMPRESSION: CTA head: 1. No intracranial large vessel occlusion or proximal high-grade arterial stenosis identified. 2. 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 3. Question tiny 1 mm aneurysm arising from a right M2 branch vessel, as described. 4. 4.1 x 3.0 x 5.0 cm mass centered in the region of the left cribriform plate/left ethmoid sinuses with expansile bony remodeling and osseous erosion as described. As before, there is erosion of the cribriform plate with some extension into the intracranial compartment. Although non-specific, this mass may reflect a metastasis and has increased in size since MRI 11/11/2014. CTA neck: 1. The bilateral common and cervical internal carotid arteries are widely patent without significant stenosis 2. Streak artifact from a dense left-sided contrast bolus limits evaluation of the V1 and proximal V2 left vertebral artery. Within this limitation, the bilateral cervical vertebral arteries are patent without significant stenosis. 3. Mild atherosclerotic disease within the visualized aortic arch. Electronically Signed   By: Kellie Simmering   On: 01/29/2019 12:25   Ct Angio Neck W And/or Wo Contrast  Result Date: 01/29/2019 CLINICAL DATA:  Left-sided  weakness, slurred speech, headache. EXAM: CT ANGIOGRAPHY HEAD AND NECK TECHNIQUE: Multidetector CT imaging of the head and neck was performed using the standard protocol during bolus administration of intravenous contrast. Multiplanar CT image reconstructions and MIPs were obtained to evaluate the vascular anatomy. Carotid stenosis measurements (when applicable) are obtained utilizing NASCET criteria, using the distal internal carotid diameter as the denominator. CONTRAST:  128m OMNIPAQUE IOHEXOL 350 MG/ML SOLN COMPARISON:  Noncontrast head CT 11/29/2018, brain MRI 11/11/2014, MRA head neck 12/19/2009, nuclear medicine bone scan 03/17/2018 FINDINGS: CTA NECK FINDINGS Aortic arch: Common origin of the innominate and left common carotid arteries. Included portions of the aortic arch demonstrate no evidence of dissection or aneurysm. Mild atherosclerotic calcification of the visualized aortic arch. Right carotid system: CCA and ICA widely patent within the neck without significant stenosis. Left carotid system: CCA and ICA widely patent within the neck without significant stenosis. Vertebral arteries: The right vertebral artery is slightly dominant. Streak artifact from a dense left-sided contrast bolus limits evaluation of the V1 and proximal V2 left vertebral artery. Within this limitation, the bilateral vertebral arteries are patent within the neck without significant stenosis (50% or greater). Skeleton: Mild cervical spondylosis. Known right parietal  calvarial metastasis. Please refer to previous nuclear medicine bone scan 03/17/2018 for description of additional osseous metastases. Other neck: No mass or enlarged lymph nodes. The thyroid gland is mildly heterogeneous but otherwise unremarkable. Upper chest: No consolidation within the imaged lung apices. Review of the MIP images confirms the above findings CTA HEAD FINDINGS Anterior circulation: Mild scattered soft and calcified plaque within the intracranial  internal carotid arteries. The intracranial internal carotid arteries are patent bilaterally without significant stenosis. Right middle and anterior cerebral arteries patent without high-grade proximal stenosis. Left middle and anterior cerebral arteries patent without high-grade proximal stenosis. 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 A tiny 1 mm aneurysm arising from a right M2 branch vessel is also questioned (series 7, image 230) (series 9, image 63). Posterior circulation: Intracranial vertebral arteries are patent without high-grade stenosis. Basilar artery is patent without high-grade stenosis. Bilateral posterior cerebral arteries patent without high-grade proximal stenosis. Predominantly fetal origin of the right posterior cerebral artery. Venous sinuses: Within limitations of contrast timing, no convincing thrombus. Anatomic variants: None significant Other: Again demonstrated is a mass centered within the region of the left cribriform plate and left ethmoid sinuses. The mass measures approximately 4.1 x 3.0 x 5.0 cm (AP x TV x CC) . There is associated expansile bony remodeling with osseous erosion through the cribriform plate into the intracranial compartment. There is also lateral bowing/erosion of the left lamina papyracea. The mass extends into the left nasal passage and partly into the left maxillary sinus. Associated complete a opacification of obstructed left frontal and left sphenoid sinuses. There is also extensive partial opacification of bilateral ethmoid air cells. Significant rightward deviation of the bony nasal septum. Previously this mass measured 2.8 x 2.8 x 2.8 cm on MRI 11/11/2014. Review of the MIP images confirms the above findings IMPRESSION: CTA head: 1. No intracranial large vessel occlusion or proximal high-grade arterial stenosis identified. 2. 3 mm aneurysm arising from the  left MCA bifurcation, unchanged as compared to MRA 12/19/2009. 3. Question tiny 1 mm aneurysm arising from a right M2 branch vessel, as described. 4. 4.1 x 3.0 x 5.0 cm mass centered in the region of the left cribriform plate/left ethmoid sinuses with expansile bony remodeling and osseous erosion as described. As before, there is erosion of the cribriform plate with some extension into the intracranial compartment. Although non-specific, this mass may reflect a metastasis and has increased in size since MRI 11/11/2014. CTA neck: 1. The bilateral common and cervical internal carotid arteries are widely patent without significant stenosis 2. Streak artifact from a dense left-sided contrast bolus limits evaluation of the V1 and proximal V2 left vertebral artery. Within this limitation, the bilateral cervical vertebral arteries are patent without significant stenosis. 3. Mild atherosclerotic disease within the visualized aortic arch. Electronically Signed   By: Kellie Simmering   On: 01/29/2019 12:25   Mr Brain Wo Contrast  Result Date: 01/29/2019 CLINICAL DATA:  78 year old female with code stroke presentation. Sudden onset left side weakness at 0955 hours. Slurred speech and headache. 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 EXAM: MRI HEAD WITHOUT CONTRAST TECHNIQUE: Multiplanar, multiecho pulse sequences of the brain and surrounding structures were obtained without intravenous contrast. COMPARISON:  CT head, CTA head and neck earlier today. Brain MRI 11/01/2014. FINDINGS: Brain: No restricted diffusion to suggest acute infarction. No midline shift, mass effect, evidence of mass lesion,  ventriculomegaly, extra-axial collection or acute intracranial hemorrhage. Cervicomedullary junction and pituitary are within normal limits. Mild for age scattered subcortical cerebral white matter T2 and FLAIR hyperintensity in both hemispheres (series 6, image 18) has mildly increased since 2016. There could be a  subtle area of right superior sensory strip encephalomalacia on image 21 which is chronic. No evidence of vasogenic edema. No other cortical encephalomalacia. No chronic cerebral blood products. The deep gray nuclei and brainstem are negative. The deep gray nuclei, brainstem and cerebellum are negative; probable small crossing right cerebellar vessel rather than tiny chronic lacune on series 8, image 31. Vascular: Major intracranial vascular flow voids are stable since 2016. Skull and upper cervical spine: Regressed right posterior skull metastasis since 2016 on series 6, image 14. heterogeneous bone marrow signal elsewhere with no destructive osseous lesion identified. Negative visible cervical spine. Sinuses/Orbits: Large and chronic but substantially increased heterogeneously T2 hyperintense mass centered in the left nasal cavity now estimated up to 47 millimeters diameter (29 millimeters in 2016). Subsequent left side paranasal sinus obstruction with diffuse retained secretions and mucosal thickening. Interestingly, the mass demonstrates facilitated diffusion (series 901, image 36). No contrast administered today. Previously the mass was homogeneously enhancing. The right ethmoid air cells are also obstructed and opacified, but the remaining right paranasal sinuses are well pneumatized. The bilateral orbits soft tissues remain normal. Other: Grossly negative visible internal auditory structures. Chronic right mastoid effusion is stable. Left mastoids remain clear. Negative scalp soft tissues. IMPRESSION: 1. Negative for acute infarct, and no significant change in the noncontrast MRI appearance of the brain since 2016. 2. 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 Subsequent paranasal sinus obstruction and left-side sinus mucocele formation. 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 Follow-up with ENT recommended. 3. Regressed right parietal bone metastasis since 2016. Electronically Signed   By: Genevie Ann M.D.   On: 01/29/2019 22:47   Mr Brain W Contrast  Result Date: 01/30/2019 CLINICAL DATA:  Follow-up abnormal brain MRI EXAM: MRI HEAD WITH CONTRAST TECHNIQUE: Multiplanar, multiecho pulse sequences of the brain and surrounding structures were obtained with intravenous contrast. CONTRAST:  26m GADAVIST GADOBUTROL 1 MMOL/ML IV SOLN COMPARISON:  Brain MRI from yesterday FINDINGS: Brain: No evidence of hematogenous metastasis to the brain. Vascular: Major vessels are enhancing Skull and upper cervical spine: Decreased enhancement and expansion of right parietal bone metastasis seen on comparison. There is only minimal enhancement today and no dural distortion. Sinuses/Orbits: Large mass centered in the left nasal cavity with cribriform plate extension and dural distortion. Mass measures up to 5 cm craniocaudal and obstructs left-sided sinuses. There is extensive bony erosion at the level of the mass, which has significantly enlarged since 2016. No visible brain invasion. IMPRESSION: 1. 5 cm left nasal cavity mass extending to the cribriform plate and causing postobstructive sinusitis. Given metastatic disease elsewhere has responded in this lesion has enlarged, favor a primary nasal mass rather than metastatic disease. 2. No worrisome findings at the regressed right parietal bone metastasis. 3. No evidence of metastasis to the brain. Electronically Signed   By: JMonte FantasiaM.D.   On: 01/30/2019 08:13   Ct Head Code Stroke Wo Contrast  Result Date: 01/29/2019 CLINICAL DATA:  Code stroke. Cerebral hemorrhage suspected, possible stroke. Additional history  provided: Patient at [facility] with sudden left-sided weakness at 09:55. Slurred speech. EXAM: CT HEAD WITHOUT CONTRAST TECHNIQUE: Contiguous  axial images were obtained from the base of the skull through the vertex without intravenous contrast. COMPARISON:  Report from head CT 04/11/2015 (images unavailable), brain MRI [id number] FINDINGS: Brain: No evidence of acute intracranial hemorrhage. No demarcated cortical infarction. No midline shift or extra-axial fluid collection. Mild generalized parenchymal atrophy. Partially empty sella turcica. Vascular: No definite hyperdense vessel. Skull: No calvarial fracture. Irregular lucency within the right parietal bone consistent with known right parietal bone metastasis. Sinuses/Orbits: Incompletely imaged partially calcified mass centered in the region of the left cribriform plate and left ethmoid sinuses. As before, there is erosion through the cribriform plate with intracranial extension. Associated bony expansion and remodeling. This includes redemonstrated lateral bowing/erosion of the left lamina papyracea. The mass extends partly into the left nasal passage and left maxillary sinus. Associated complete opacification of the left frontal and sphenoid sinuses. The mass measures 3.8 x 3.1 cm (AP x TV) and at least 3.3 cm in craniocaudal dimension (series 2, image 1) (series 6, image 27). On prior brain MRI 11/11/2014, this mass measured 2.8 x 2.8 x 2.8 cm ASPECTS [facility] Stroke Program Early CT Score) - Ganglionic level infarction (caudate, lentiform nuclei, internal capsule, insula, M1-M3 cortex): 7 - Supraganglionic infarction (M4-M6 cortex): 3 Total score (0-10 with 10 being normal): 10 These results were called by telephone at the time of interpretation on 01/29/2019 at 10:53 am to provider [facility] , who verbally acknowledged these results. IMPRESSION: 1. No evidence of intracranial hemorrhage or acute demarcated cortical infarction 2. Incompletely imaged partially calcified mass centered in the region of the left cribriform plate and left ethmoid sinuses. As before, there is erosion through the  cribriform plate with intracranial extension. The mass measures 3.8 x 3.1 cm (AP x TV) and at least 3.3 cm in craniocaudal dimension (previously 2.8 x 2.8 x 2.8 cm on MRI 11/11/2014). Complete opacification of obstructed left frontal and left sphenoid sinuses. 3. Known right parietal bone metastasis. Electronically Signed   By: Kellie Simmering   On: 01/29/2019 11:06     Assessment: 78 y.o. 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 (a) bone scan and chest CT 07/09/2016 shows stable disease. (b) CA-27-29 is moderately informative (c) bone scan and CT scan of the chest 04/24/2017 shows essentially stable disease  (3) on Arimidex between September 2015 and June 2017, with evidence of response; discontinuedsecondary to side effects (a) bone density 04/10/2016 shows osteopenia with a T score of -2.1  (4) on monthly denosumab/Xgeva between 07/30/2014 and 10/04/2015, discontinueddue to patient's concerns regarding osteonecrosis of the jaw (a) discussed again October 2017, the patient adamantly refusing denosumab  (5) started fulvestrant 01/26/2016  (a) Palbociclibadded at 75 mg M/W/F, beginning mid February 2018 (b) chest CT scan obtained 12/02/2017 shows small but measurable growth of lung lesions; bones are stable (c) palbociclib dose increased to  75 mg daily 21/7 beginning 12/23/2017     Plan:  [place] is clearly improved  and discharge planning is ongoing. Erica Young tells me Erica Young is "exhausted" and will not be able to care for Erica Young (who is receiving radiation to brain metastases). Erica Young solution was to "go to Hospice" but Erica Young is not a Hospice candidate as Erica Young anticipated survival even w/o treatment is > 6 months.  Erica Young has a left dental abscess which ha caused Erica Young symptoms and Erica Young will need an extraction at some point. Erica Young will also need ENT Bx of the left nasal mass (which was noted in 2016 and irradiated but never biopsied). That also can be done as outpatient.   Erica Young is scheduled to return to our clinic for the next faslodex dose 02/26/2019. I told Erica Young to "take a vacation" from [facility] until then.  Erica Young will need HH-PT and OT and requests a w/c as well. Erica Young family is involved though it is not clear to me how much if any they can participate in Erica Young day-to-day care.  I greatly appreciate your help to this patient! Please let me know if I can be of further help  Chauncey Cruel, MD 02/02/2019  8:11 AM Medical Oncology and Hematology [facility] [address] [place], [place] [address] Tel. [phone]    Fax. [phone]

## 2019-02-02 NOTE — Progress Notes (Cosign Needed)
Patient suffers from severe weakness which impairs their ability to perform daily activities like walking in the home. A walking aid will not resolve issue with performing activities of daily living. A wheelchair will allow patient to safely perform daily activities. Patient is not able to propel themselves in the home using a standard weight wheelchair due to severe weakness. Patient can self propel in the lightweight wheelchair. Length of need 6 months.  Accessories: elevating leg rests, wheel locks, extensions and anti-tippers.

## 2019-02-02 NOTE — Progress Notes (Signed)
Progress Note  Patient Name: Erica Young Date of Encounter: 02/02/2019  Primary Cardiologist:  Pixie Casino, MD  Subjective   Breathes better when sitting up higher. Gets SOB easily w/ exertion, also has anxiety  Inpatient Medications    Scheduled Meds:  ALPRAZolam  0.25 mg Oral TID   amoxicillin  1,000 mg Oral BID   aspirin  300 mg Rectal Daily   Or   aspirin  325 mg Oral Daily   carvedilol  6.25 mg Oral BID WC   enoxaparin (LOVENOX) injection  40 mg Subcutaneous Q24H   feeding supplement (ENSURE ENLIVE)  237 mL Oral BID BM   fluticasone  2 spray Each Nare Daily   ketorolac  30 mg Intravenous Once   loratadine  10 mg Oral Daily   metoCLOPramide (REGLAN) injection  10 mg Intravenous Once   metroNIDAZOLE  500 mg Oral BID   ondansetron  4 mg Oral Once   prochlorperazine  10 mg Intravenous Once   sodium chloride flush  3 mL Intravenous Once   spironolactone  12.5 mg Oral Daily   Continuous Infusions:  PRN Meds: acetaminophen **OR** acetaminophen (TYLENOL) oral liquid 160 mg/5 mL **OR** acetaminophen   Vital Signs    Vitals:   02/01/19 2059 02/01/19 2335 02/02/19 0412 02/02/19 0755  BP: (!) 145/73 (!) 117/53 134/74 125/72  Pulse: (!) 101 (!) 108 91 96  Resp: 18 16 17 18   Temp: 98.2 F (36.8 C) 97.9 F (36.6 C) 97.7 F (36.5 C) 97.7 F (36.5 C)  TempSrc: Oral  Oral Oral  SpO2: 95% 95% 92% 96%  Weight:        Intake/Output Summary (Last 24 hours) at 02/02/2019 1031 Last data filed at 02/02/2019 0900 Gross per 24 hour  Intake 240 ml  Output 700 ml  Net -460 ml   Filed Weights   02/01/19 0500  Weight: 61.4 kg   Last Weight  Most recent update: 02/01/2019  7:26 AM   Weight  61.4 kg (135 lb 5.8 oz)           Weight change:    Telemetry    SR, ST - Personally Reviewed  ECG    10/01, SR, HR 79, no acute changes - Personally Reviewed  Physical Exam   General: frail, elderly, female appearing in no acute distress. Head:  Normocephalic, atraumatic.  Neck: Supple without bruits, JVD 9 cm. Lungs:  Resp regular and unlabored, scattered rales. Heart: RRR, S1, S2, no S3, S4, or murmur; no rub. Abdomen: Soft, non-tender, non-distended with normoactive bowel sounds. No hepatomegaly. No rebound/guarding. No obvious abdominal masses. Extremities: No clubbing, cyanosis, no edema. Distal pedal pulses are 1-2+ bilaterally. Neuro: Alert and oriented X 2. Moves all extremities spontaneously. Psych: Normal affect.  Labs    Hematology Recent Labs  Lab 01/29/19 1048 02/01/19 0643 02/02/19 0636  WBC 2.3* 2.7* 2.4*  RBC 3.36* 3.25* 3.29*  HGB 12.6 12.5 13.0  HCT 37.8 35.6* 36.0  MCV 112.5* 109.5* 109.4*  MCH 37.5* 38.5* 39.5*  MCHC 33.3 35.1 36.1*  RDW 12.7 12.4 12.1  PLT 296 290 280    Chemistry Recent Labs  Lab 01/29/19 0855 01/29/19 1047 01/29/19 1048 02/01/19 0643  NA 139 138 136 138  K 4.6 4.1 4.6 4.1  CL 104 106 104 105  CO2 28  --  24 25  GLUCOSE 102* 90 97 101*  BUN 20 19 20 12   CREATININE 0.77 0.60 0.72 0.76  CALCIUM 9.1  --  8.8* 8.5*  PROT 7.2  --  7.1  --   ALBUMIN 3.7  --  4.0  --   AST 16  --  28  --   ALT 13  --  16  --   ALKPHOS 82  --  73  --   BILITOT 0.3  --  0.6  --   GFRNONAA >60  --  >60 >60  GFRAA >60  --  >60 >60  ANIONGAP 7  --  8 8     BNP Recent Labs  Lab 01/31/19 1154  BNP 13.4     Radiology    Ct Angio Head W/cm &/or Wo Cm  Result Date: 01/29/2019 CLINICAL DATA:  Left-sided weakness, slurred speech, headache. EXAM: CT ANGIOGRAPHY HEAD AND NECK TECHNIQUE: Multidetector CT imaging of the head and neck was performed using the standard protocol during bolus administration of intravenous contrast. Multiplanar CT image reconstructions and MIPs were obtained to evaluate the vascular anatomy. Carotid stenosis measurements (when applicable) are obtained utilizing NASCET criteria, using the distal internal carotid diameter as the denominator. CONTRAST:  126mL OMNIPAQUE  IOHEXOL 350 MG/ML SOLN COMPARISON:  Noncontrast head CT 11/29/2018, brain MRI 11/11/2014, MRA head neck 12/19/2009, nuclear medicine bone scan 03/17/2018 FINDINGS: CTA NECK FINDINGS Aortic arch: Common origin of the innominate and left common carotid arteries. Included portions of the aortic arch demonstrate no evidence of dissection or aneurysm. Mild atherosclerotic calcification of the visualized aortic arch. Right carotid system: CCA and ICA widely patent within the neck without significant stenosis. Left carotid system: CCA and ICA widely patent within the neck without significant stenosis. Vertebral arteries: The right vertebral artery is slightly dominant. Streak artifact from a dense left-sided contrast bolus limits evaluation of the V1 and proximal V2 left vertebral artery. Within this limitation, the bilateral vertebral arteries are patent within the neck without significant stenosis (50% or greater). Skeleton: Mild cervical spondylosis. Known right parietal calvarial metastasis. Please refer to previous nuclear medicine bone scan 03/17/2018 for description of additional osseous metastases. Other neck: No mass or enlarged lymph nodes. The thyroid gland is mildly heterogeneous but otherwise unremarkable. Upper chest: No consolidation within the imaged lung apices. Review of the MIP images confirms the above findings CTA HEAD FINDINGS Anterior circulation: Mild scattered soft and calcified plaque within the intracranial internal carotid arteries. The intracranial internal carotid arteries are patent bilaterally without significant stenosis. Right middle and anterior cerebral arteries patent without high-grade proximal stenosis. Left middle and anterior cerebral arteries patent without high-grade proximal stenosis. 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 A tiny 1 mm aneurysm arising from a right M2 branch  vessel is also questioned (series 7, image 230) (series 9, image 63). Posterior circulation: Intracranial vertebral arteries are patent without high-grade stenosis. Basilar artery is patent without high-grade stenosis. Bilateral posterior cerebral arteries patent without high-grade proximal stenosis. Predominantly fetal origin of the right posterior cerebral artery. Venous sinuses: Within limitations of contrast timing, no convincing thrombus. Anatomic variants: None significant Other: Again demonstrated is a mass centered within the region of the left cribriform plate and left ethmoid sinuses. The mass measures approximately 4.1 x 3.0 x 5.0 cm (AP x TV x CC) . There is associated expansile bony remodeling with osseous erosion through the cribriform plate into the intracranial compartment. There is also lateral bowing/erosion of the left lamina papyracea. The mass extends into the left nasal passage  and partly into the left maxillary sinus. Associated complete a opacification of obstructed left frontal and left sphenoid sinuses. There is also extensive partial opacification of bilateral ethmoid air cells. Significant rightward deviation of the bony nasal septum. Previously this mass measured 2.8 x 2.8 x 2.8 cm on MRI 11/11/2014. Review of the MIP images confirms the above findings IMPRESSION: CTA head: 1. No intracranial large vessel occlusion or proximal high-grade arterial stenosis identified. 2. 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 3. Question tiny 1 mm aneurysm arising from a right M2 branch vessel, as described. 4. 4.1 x 3.0 x 5.0 cm mass centered in the region of the left cribriform plate/left ethmoid sinuses with expansile bony remodeling and osseous erosion as described. As before, there is erosion of the cribriform plate with some extension into the intracranial compartment. Although non-specific, this mass may reflect a metastasis and has increased  in size since MRI 11/11/2014. CTA neck: 1. The bilateral common and cervical internal carotid arteries are widely patent without significant stenosis 2. Streak artifact from a dense left-sided contrast bolus limits evaluation of the V1 and proximal V2 left vertebral artery. Within this limitation, the bilateral cervical vertebral arteries are patent without significant stenosis. 3. Mild atherosclerotic disease within the visualized aortic arch. Electronically Signed   By: Kellie Simmering   On: 01/29/2019 12:25   Ct Angio Neck W And/or Wo Contrast  Result Date: 01/29/2019 CLINICAL DATA:  Left-sided weakness, slurred speech, headache. EXAM: CT ANGIOGRAPHY HEAD AND NECK TECHNIQUE: Multidetector CT imaging of the head and neck was performed using the standard protocol during bolus administration of intravenous contrast. Multiplanar CT image reconstructions and MIPs were obtained to evaluate the vascular anatomy. Carotid stenosis measurements (when applicable) are obtained utilizing NASCET criteria, using the distal internal carotid diameter as the denominator. CONTRAST:  174mL OMNIPAQUE IOHEXOL 350 MG/ML SOLN COMPARISON:  Noncontrast head CT 11/29/2018, brain MRI 11/11/2014, MRA head neck 12/19/2009, nuclear medicine bone scan 03/17/2018 FINDINGS: CTA NECK FINDINGS Aortic arch: Common origin of the innominate and left common carotid arteries. Included portions of the aortic arch demonstrate no evidence of dissection or aneurysm. Mild atherosclerotic calcification of the visualized aortic arch. Right carotid system: CCA and ICA widely patent within the neck without significant stenosis. Left carotid system: CCA and ICA widely patent within the neck without significant stenosis. Vertebral arteries: The right vertebral artery is slightly dominant. Streak artifact from a dense left-sided contrast bolus limits evaluation of the V1 and proximal V2 left vertebral artery. Within this limitation, the bilateral vertebral  arteries are patent within the neck without significant stenosis (50% or greater). Skeleton: Mild cervical spondylosis. Known right parietal calvarial metastasis. Please refer to previous nuclear medicine bone scan 03/17/2018 for description of additional osseous metastases. Other neck: No mass or enlarged lymph nodes. The thyroid gland is mildly heterogeneous but otherwise unremarkable. Upper chest: No consolidation within the imaged lung apices. Review of the MIP images confirms the above findings CTA HEAD FINDINGS Anterior circulation: Mild scattered soft and calcified plaque within the intracranial internal carotid arteries. The intracranial internal carotid arteries are patent bilaterally without significant stenosis. Right middle and anterior cerebral arteries patent without high-grade proximal stenosis. Left middle and anterior cerebral arteries patent without high-grade proximal stenosis. 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 A tiny 1 mm aneurysm arising from a right M2  branch vessel is also questioned (series 7, image 230) (series 9, image 63). Posterior circulation: Intracranial vertebral arteries are patent without high-grade stenosis. Basilar artery is patent without high-grade stenosis. Bilateral posterior cerebral arteries patent without high-grade proximal stenosis. Predominantly fetal origin of the right posterior cerebral artery. Venous sinuses: Within limitations of contrast timing, no convincing thrombus. Anatomic variants: None significant Other: Again demonstrated is a mass centered within the region of the left cribriform plate and left ethmoid sinuses. The mass measures approximately 4.1 x 3.0 x 5.0 cm (AP x TV x CC) . There is associated expansile bony remodeling with osseous erosion through the cribriform plate into the intracranial compartment. There is also lateral bowing/erosion of the  left lamina papyracea. The mass extends into the left nasal passage and partly into the left maxillary sinus. Associated complete a opacification of obstructed left frontal and left sphenoid sinuses. There is also extensive partial opacification of bilateral ethmoid air cells. Significant rightward deviation of the bony nasal septum. Previously this mass measured 2.8 x 2.8 x 2.8 cm on MRI 11/11/2014. Review of the MIP images confirms the above findings IMPRESSION: CTA head: 1. No intracranial large vessel occlusion or proximal high-grade arterial stenosis identified. 2. 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 3. Question tiny 1 mm aneurysm arising from a right M2 branch vessel, as described. 4. 4.1 x 3.0 x 5.0 cm mass centered in the region of the left cribriform plate/left ethmoid sinuses with expansile bony remodeling and osseous erosion as described. As before, there is erosion of the cribriform plate with some extension into the intracranial compartment. Although non-specific, this mass may reflect a metastasis and has increased in size since MRI 11/11/2014. CTA neck: 1. The bilateral common and cervical internal carotid arteries are widely patent without significant stenosis 2. Streak artifact from a dense left-sided contrast bolus limits evaluation of the V1 and proximal V2 left vertebral artery. Within this limitation, the bilateral cervical vertebral arteries are patent without significant stenosis. 3. Mild atherosclerotic disease within the visualized aortic arch. Electronically Signed   By: Kellie Simmering   On: 01/29/2019 12:25   Mr Brain Wo Contrast  Result Date: 01/29/2019 CLINICAL DATA:  78 year old female with code stroke presentation. Sudden onset left side weakness at 0955 hours. Slurred speech and headache. 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 EXAM: MRI HEAD WITHOUT CONTRAST TECHNIQUE: Multiplanar, multiecho pulse  sequences of the brain and surrounding structures were obtained without intravenous contrast. COMPARISON:  CT head, CTA head and neck earlier today. Brain MRI 11/01/2014. FINDINGS: Brain: No restricted diffusion to suggest acute infarction. No midline shift, mass effect, evidence of mass lesion, ventriculomegaly, extra-axial collection or acute intracranial hemorrhage. Cervicomedullary junction and pituitary are within normal limits. Mild for age scattered subcortical cerebral white matter T2 and FLAIR hyperintensity in both hemispheres (series 6, image 18) has mildly increased since 2016. There could be a subtle area of right superior sensory strip encephalomalacia on image 21 which is chronic. No evidence of vasogenic edema. No other cortical encephalomalacia. No chronic cerebral blood products. The deep gray nuclei and brainstem are negative. The deep gray nuclei, brainstem and cerebellum are negative; probable small crossing right cerebellar vessel rather than tiny chronic lacune on series 8, image 31. Vascular: Major intracranial vascular flow voids are stable since 2016. Skull and upper cervical spine: Regressed right posterior skull metastasis since 2016 on series 6, image 14. heterogeneous bone marrow signal elsewhere  with no destructive osseous lesion identified. Negative visible cervical spine. Sinuses/Orbits: Large and chronic but substantially increased heterogeneously T2 hyperintense mass centered in the left nasal cavity now estimated up to 47 millimeters diameter (29 millimeters in 2016). Subsequent left side paranasal sinus obstruction with diffuse retained secretions and mucosal thickening. Interestingly, the mass demonstrates facilitated diffusion (series 901, image 36). No contrast administered today. Previously the mass was homogeneously enhancing. The right ethmoid air cells are also obstructed and opacified, but the remaining right paranasal sinuses are well pneumatized. The bilateral orbits  soft tissues remain normal. Other: Grossly negative visible internal auditory structures. Chronic right mastoid effusion is stable. Left mastoids remain clear. Negative scalp soft tissues. IMPRESSION: 1. Negative for acute infarct, and no significant change in the noncontrast MRI appearance of the brain since 2016. 2. 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 Subsequent paranasal sinus obstruction and left-side sinus mucocele formation. 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 Follow-up with ENT recommended. 3. Regressed right parietal bone metastasis since 2016. Electronically Signed   By: Genevie Ann M.D.   On: 01/29/2019 22:47   Mr Brain W Contrast  Result Date: 01/30/2019 CLINICAL DATA:  Follow-up abnormal brain MRI EXAM: MRI HEAD WITH CONTRAST TECHNIQUE: Multiplanar, multiecho pulse sequences of the brain and surrounding structures were obtained with intravenous contrast. CONTRAST:  39mL GADAVIST GADOBUTROL 1 MMOL/ML IV SOLN COMPARISON:  Brain MRI from yesterday FINDINGS: Brain: No evidence of hematogenous metastasis to the brain. Vascular: Major vessels are enhancing Skull and upper cervical spine: Decreased enhancement and expansion of right parietal bone metastasis seen on comparison. There is only minimal enhancement today and no dural distortion. Sinuses/Orbits: Large mass centered in the left nasal cavity with cribriform plate extension and dural distortion. Mass measures up to 5 cm craniocaudal and obstructs left-sided sinuses. There is extensive bony erosion at the level of the mass, which has significantly enlarged since 2016. No visible brain invasion. IMPRESSION: 1. 5 cm left nasal cavity mass extending to the cribriform plate and causing postobstructive sinusitis. Given metastatic  disease elsewhere has responded in this lesion has enlarged, favor a primary nasal mass rather than metastatic disease. 2. No worrisome findings at the regressed right parietal bone metastasis. 3. No evidence of metastasis to the brain. Electronically Signed   By: Monte Fantasia M.D.   On: 01/30/2019 08:13   Ct Head Code Stroke Wo Contrast  Result Date: 01/29/2019 CLINICAL DATA:  Code stroke. Cerebral hemorrhage suspected, possible stroke. Additional history provided: Patient at [facility] with sudden left-sided weakness at 09:55. Slurred speech. EXAM: CT HEAD WITHOUT CONTRAST TECHNIQUE: Contiguous axial images were obtained from the base of the skull through the vertex without intravenous contrast. COMPARISON:  Report from head CT 04/11/2015 (images unavailable), brain MRI [id number] FINDINGS: Brain: No evidence of acute intracranial hemorrhage. No demarcated cortical infarction. No midline shift or extra-axial fluid collection. Mild generalized parenchymal atrophy. Partially empty sella turcica. Vascular: No definite hyperdense vessel. Skull: No calvarial fracture. Irregular lucency within the right parietal bone consistent with known right parietal bone metastasis. Sinuses/Orbits: Incompletely imaged partially calcified mass centered in the region of the left cribriform plate and left ethmoid sinuses. As before, there is erosion through the cribriform plate with intracranial extension. Associated bony expansion and remodeling. This includes  redemonstrated lateral bowing/erosion of the left lamina papyracea. The mass extends partly into the left nasal passage and left maxillary sinus. Associated complete opacification of the left frontal and sphenoid sinuses. The mass measures 3.8 x 3.1 cm (AP x TV) and at least 3.3 cm in craniocaudal dimension (series 2, image 1) (series 6, image 27). On prior brain MRI 11/11/2014, this mass measured 2.8 x 2.8 x 2.8 cm ASPECTS [facility] Stroke Program Early CT Score) -  Ganglionic level infarction (caudate, lentiform nuclei, internal capsule, insula, M1-M3 cortex): 7 - Supraganglionic infarction (M4-M6 cortex): 3 Total score (0-10 with 10 being normal): 10 These results were called by telephone at the time of interpretation on 01/29/2019 at 10:53 am to provider [facility] , who verbally acknowledged these results. IMPRESSION: 1. No evidence of intracranial hemorrhage or acute demarcated cortical infarction 2. Incompletely imaged partially calcified mass centered in the region of the left cribriform plate and left ethmoid sinuses. As before, there is erosion through the cribriform plate with intracranial extension. The mass measures 3.8 x 3.1 cm (AP x TV) and at least 3.3 cm in craniocaudal dimension (previously 2.8 x 2.8 x 2.8 cm on MRI 11/11/2014). Complete opacification of obstructed left frontal and left sphenoid sinuses. 3. Known right parietal bone metastasis. Electronically Signed   By: Kellie Simmering   On: 01/29/2019 11:06     Cardiac Studies   ECHO:  01/30/2019  1. Left ventricular ejection fraction, by visual estimation, is 30 to 35%. The left ventricle has moderate to severely decreased function. There is mildly increased left ventricular hypertrophy.  2. Multiple segmental abnormalities exist. See findings.  3. Left ventricular diastolic Doppler parameters are consistent with impaired relaxation pattern of LV diastolic filling.  4. Global right ventricle has moderately reduced systolic function.The right ventricular size is normal. No increase in right ventricular wall thickness.  5. Left atrial size was normal.  6. Right atrial size was normal.  7. The mitral valve is grossly normal. Trace mitral valve regurgitation.  8. The tricuspid valve is grossly normal. Tricuspid valve regurgitation is trivial.  9. The aortic valve is tricuspid Aortic valve regurgitation was not visualized by color flow Doppler. 10. The pulmonic valve was grossly normal. Pulmonic  valve regurgitation is not visualized by color flow Doppler. 11. Normal pulmonary artery systolic pressure. 12. The inferior vena cava is normal in size with <50% respiratory variability, suggesting right atrial pressure of 8 mmHg. 13. The interatrial septum was not well visualized.  Patient Profile     78 y.o. female w/ hx metastatic breast CA, D-CHF, was admitted 10/02 after Faslodex injection. Had HA, dizzy, syncope, ?L facial droop. 5 cm L nasal cavity mass seen on CT. Cards saw for CHF, w/ EF now 30-35%.   Assessment & Plan    1. 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 Syncope - was very weak on admission, still too weak to care for herself - no sig ectopy/arrhythmia on telemetry - since has sig weakness w/out arrhythmia, no monitor for now - follow, continue BB  3. Metastatic breast CA - per IM, Oncology  Otherwise, per IM Principal Problem:   Acute left-sided weakness Active Problems:   Cancer of right breast, stage 4 (HCC)  Leukopenia due to antineoplastic chemotherapy [facility])   Anxiety    Signed, Rosaria Ferries , PA-C 10:31 AM 02/02/2019 Pager: [phone]

## 2019-02-02 NOTE — NC FL2 (Addendum)
[place] LEVEL OF CARE SCREENING TOOL     IDENTIFICATION  Patient Name: Erica Young Birthdate: [birth year] Sex: female Admission Date (Current Location): 01/29/2019  [facility] and [place] Number:  Herbalist and Address:  [facility], [place] [address] [place],  [address]      Provider Number: [id number]  Attending Physician Name and Address:  Kayleen Memos, DO  Relative Name and Phone Number:       Current Level of Care: Hospital Recommended Level of Care: [place] Prior Approval Number:    Date Approved/Denied:   PASRR Number:  [id number]  Discharge Plan: SNF    Current Diagnoses: Patient Active Problem List   Diagnosis Date Noted  . Acute systolic (congestive) heart failure ([place])   . Shortness of breath   . Acute left-sided weakness 01/29/2019  . Leukopenia due to antineoplastic chemotherapy ([place]) 01/29/2019  . Anxiety 01/29/2019  . Aortic atherosclerosis ([place]) 01/03/2018  . History of epistaxis 10/15/2017  . GI symptoms/possible food intolerance 06/18/2017  . Malignant neoplasm of upper-outer quadrant of right breast in female, estrogen receptor positive ([place]) 01/19/2016  . Bone metastases ([place]) 11/22/2014  . Perennial allergic rhinitis with a nonallergic component 02/12/2014  . Cancer of right breast, stage 4 ([place]) 01/23/2014  . History of breast cancer 01/11/2014  . Chronic rhinitis 09/17/2013  . Hilar adenopathy 05/12/2013  . Abdominal pain 05/07/2011  . Hypertension 06/09/2010  . BACK PAIN 04/10/2010  . Dyslipidemia 03/03/2010  . Cerebrovascular disease 03/03/2010  . DIVERTICULOSIS, COLON 03/03/2010    Orientation RESPIRATION BLADDER Height & Weight     Self, Time, Situation, Place  Normal Continent Weight: 61.4 kg Height:     BEHAVIORAL SYMPTOMS/MOOD NEUROLOGICAL BOWEL NUTRITION STATUS      Continent Diet(heart healthy)  AMBULATORY STATUS COMMUNICATION OF NEEDS Skin   Limited  Assist Verbally Normal                       Personal Care Assistance Level of Assistance  Bathing, Feeding, Dressing Bathing Assistance: Limited assistance Feeding assistance: Independent Dressing Assistance: Limited assistance     Functional Limitations Info  Sight, Hearing, Speech Sight Info: Adequate Hearing Info: Adequate      SPECIAL CARE FACTORS FREQUENCY  PT (By licensed PT), OT (By licensed OT)     PT Frequency: 5x/wk OT Frequency: 5x/wk            Contractures Contractures Info: Not present    Additional Factors Info  Code Status, Allergies, Psychotropic Code Status Info: DNR Allergies Info: Ciprofloxacin Psychotropic Info: Xanax 0.25 mg Three times a day         Current Medications (02/02/2019):  This is the current hospital active medication list Current Facility-Administered Medications  Medication Dose Route Frequency Provider Last Rate Last Dose  . acetaminophen (TYLENOL) tablet 650 mg  650 mg Oral Q4H PRN Samuella Cota, MD       Or  . acetaminophen (TYLENOL) solution 650 mg  650 mg Per Tube Q4H PRN Samuella Cota, MD       Or  . acetaminophen (TYLENOL) suppository 650 mg  650 mg Rectal Q4H PRN Samuella Cota, MD      . ALPRAZolam Duanne Moron) tablet 0.25 mg  0.25 mg Oral TID Irene Pap N, DO   0.25 mg at 02/02/19 0914  . amoxicillin (AMOXIL) capsule 1,000 mg  1,000 mg Oral BID Irene Pap N, DO      .  aspirin suppository 300 mg  300 mg Rectal Daily Samuella Cota, MD       Or  . aspirin tablet 325 mg  325 mg Oral Daily Samuella Cota, MD   325 mg at 02/02/19 3532  . carvedilol (COREG) tablet 6.25 mg  6.25 mg Oral BID WC Satira Sark, MD   6.25 mg at 02/02/19 0914  . enoxaparin (LOVENOX) injection 40 mg  40 mg Subcutaneous Q24H Irene Pap N, DO   40 mg at 02/01/19 2208  . feeding supplement (ENSURE ENLIVE) (ENSURE ENLIVE) liquid 237 mL  237 mL Oral BID BM Irene Pap N, DO   237 mL at 02/02/19 0917  . fluticasone  (FLONASE) 50 MCG/ACT nasal spray 2 spray  2 spray Each Nare Daily Hall, Carole N, DO      . ketorolac (TORADOL) 30 MG/ML injection 30 mg  30 mg Intravenous Once Aroor, Sushanth R, MD      . loratadine (CLARITIN) tablet 10 mg  10 mg Oral Daily Samuella Cota, MD   10 mg at 02/02/19 0914  . metoCLOPramide (REGLAN) injection 10 mg  10 mg Intravenous Once Aroor, Sushanth R, MD      . metroNIDAZOLE (FLAGYL) tablet 500 mg  500 mg Oral BID Hall, Carole N, DO      . ondansetron (ZOFRAN-ODT) disintegrating tablet 4 mg  4 mg Oral Once Samuella Cota, MD   Stopped at 01/29/19 1855  . prochlorperazine (COMPAZINE) injection 10 mg  10 mg Intravenous Once Aroor, Sushanth R, MD      . sodium chloride flush (NS) 0.9 % injection 3 mL  3 mL Intravenous Once Samuella Cota, MD   Stopped at 01/29/19 1041  . spironolactone (ALDACTONE) tablet 12.5 mg  12.5 mg Oral Daily Satira Sark, MD   12.5 mg at 02/02/19 9924     Discharge Medications: Please see discharge summary for a list of discharge medications.  Relevant Imaging Results:  Relevant Lab Results:   Additional Information SS#: [id number]  Pollie Friar, RN

## 2019-02-02 NOTE — Progress Notes (Signed)
PROGRESS NOTE  Erica Young OHY:[id number] DOB: March 02, 1941 DOA: 01/29/2019 PCP: Isaac Bliss, Rayford Halsted, MD  HPI/Recap of past 65 hours:  78 year old extremely anxious female with metastatic breast cancer, extensive bony metastasis was at [facility] yesterday, following her Faslodex injection which she has received multiple times in the past she reported sudden onset severe headache, dizziness followed by loss of consciousness, this lasted around 2 minutes when she regained consciousness she was noted to have a left facial droop, mild slurring and left arm, leg weakness. -Code stroke was activated, patient sent to the emergency room, she was seen by neurology, MRI was ordered, not felt to be a TPA candidate due to known intracranial lesion.  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  02/02/19: Patient was seen and examined at her bedside this morning.  She reports feeling extremely weak making it difficult to feed herself.  States she feels greatly out of breath when she gets up and walk.  Also reports not getting her Xanax on time.  She takes it 3 times daily scheduled at home.  Conversational dyspnea and jitteriness noted during this visit.  Ordered feeding assistance and home dose scheduled Xanax to ensure she will get her medication on time.  Will obtain home O2 evaluation to see if she qualifies for home oxygen supplementation.  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  No sign of acute brain infarct. She will need ENT biopsy as recommended by oncology, this could be done outpatient. Continue supportive management  Generalized weakness with severe debility in the setting of advanced stage IV breast cancer with metastasis Patient appears very weak on exam to the point that she is unable to feel herself Start feeding assistance Dietary consult for nutritional assistance Ordered wheelchair to assist with her weakness, she tires out with a walker Continue PT OT Continue fall precautions Continue to increase oral protein calorie intake Out of bed to chair as tolerated with every shift and fall precautions  Severe anxiety Very jittery on exam Resume home dose Xanax which she takes scheduled 0.25 mg 3 times daily.  Recent left dental abscess Presented on Augmentin and oral Flagyl Patient will need tooth to to be extracted Will need to follow-up with a dentist outpatient Afebrile but leukopenic with WBC of 2.4, neutrophil count 1.3.  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  Acute systolic CHF in the setting of chronic diastolic CHF 2D echo done on 01/30/2019 showed LVEF 30 to 35% with impaired relaxation of left ventricular diastolic Doppler parameters Low BNP and euvolemic on exam  Seen by cardiology, highly appreciated. 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  Jana Hakim Gets Faslodex infusions. Hold Ibrance per Dr. Jana Hakim  Not a candidate for hospice at this time  Leukopenia likely secondary to recent chemotherapy WBC 2.4 on 02/02/2019 from 2.7 on 02/01/19 from 2.3 on 01/29/19. Continue to monitor Will discuss with oncology Dr. Jana Hakim  Severe protein calorie malnutrition in the setting of stage IV breast cancer with mets Continue oral supplement Dietary consult Encourage increase oral protein calorie intake  DVT prophylaxis: Sq lovenox daily Code Status: DNR Family Communication:  None at bedside   Disposition Plan:  Patient is currently not appropriate for discharge at this time due to severe weakness with severe debility and poor oral intake in the setting of newly diagnosed cardiomyopathy, multiple comorbidities and advanced age.  She requires another midnight to monitor response to new cardiac medications.   Consultants:  Neurology Cardiology    Procedures:   Antimicrobials:    Objective: Vitals:   02/01/19 2059 02/01/19 2335 02/02/19 0412 02/02/19 0755  BP: (!) 145/73 (!) 117/53 134/74 125/72  Pulse: (!) 101 (!) 108 91 96  Resp: 18 16 17 18   Temp: 98.2 F (36.8 C) 97.9 F (36.6 C) 97.7 F (36.5 C) 97.7 F (36.5 C)  TempSrc: Oral  Oral Oral  SpO2: 95% 95% 92% 96%  Weight:        Intake/Output Summary (Last 24 hours) at 02/02/2019 0854 Last data filed at 02/02/2019 0413 Gross per 24 hour  Intake -  Output 700 ml  Net -700 ml   Filed Weights   02/01/19 0500  Weight: 61.4 kg    Exam:  . General: 78 y.o. year-old female frail-appearing with conversational dyspnea on oxygen supplementation by nasal cannula.  Alert and oriented x3.  Very jittery on exam. . Cardiovascular: Regular rate and rhythm no rubs or gallops no JVD or thyromegaly noted.   Marland Kitchen Respiratory: Mild rales at bases.  No wheezes noted.  Poor inspiratory effort.  .  Abdomen: Soft nontender nondistended normal bowel sounds present.   .  Musculoskeletal: No lower extremity edema.  2/4 pulses in all 4 extremities.   Marland Kitchen Psychiatry: Mood is very anxious and jittery.   Data Reviewed: CBC: Recent Labs  Lab 01/29/19 0855 01/29/19 1047 01/29/19 1048 02/01/19 0643 02/02/19 0636  WBC 2.3*  --  2.3* 2.7* 2.4*  NEUTROABS 1.3*  --  1.3*  --  1.3*  HGB 12.2 11.9* 12.6 12.5 13.0  HCT 36.0 35.0* 37.8 35.6* 36.0  MCV 112.5*  --  112.5* 109.5* 109.4*  PLT 294  --  296 290 539   Basic Metabolic Panel: Recent Labs  Lab 01/29/19 0855 01/29/19 1047 01/29/19 1048 02/01/19 0643  NA 139 138 136 138  K 4.6 4.1 4.6 4.1  CL 104 106 104 105  CO2 28  --  24 25  GLUCOSE 102* 90 97 101*  BUN 20 19 20 12   CREATININE 0.77 0.60 0.72 0.76  CALCIUM 9.1  --  8.8* 8.5*   GFR: Estimated Creatinine Clearance: 50 mL/min (by C-G formula based on SCr of 0.76 mg/dL). Liver Function Tests: Recent Labs  Lab 01/29/19 0855 01/29/19 1048  AST 16 28  ALT 13 16  ALKPHOS 82 73  BILITOT 0.3 0.6  PROT 7.2 7.1  ALBUMIN 3.7 4.0   No results for input(s): LIPASE, AMYLASE in the last 168 hours. No results for input(s): AMMONIA  in the last 168 hours. Coagulation Profile: Recent Labs  Lab 01/29/19 1048  INR 1.1   Cardiac Enzymes: No results for input(s): CKTOTAL, CKMB, CKMBINDEX, TROPONINI in the last 168 hours. BNP (last 3 results) No results for input(s): PROBNP in the last 8760 hours. HbA1C: No results for input(s): HGBA1C in the last 72 hours. CBG: Recent Labs  Lab 01/29/19 1037 01/31/19 1207  GLUCAP 96 117*   Lipid Profile: No results for input(s): CHOL, HDL, LDLCALC, TRIG, CHOLHDL, LDLDIRECT in the last 72 hours. Thyroid Function Tests: No results for input(s): TSH, T4TOTAL, FREET4, T3FREE, THYROIDAB in the last 72 hours. Anemia Panel: No results for input(s): VITAMINB12, FOLATE, FERRITIN, TIBC, IRON, RETICCTPCT in the last 72 hours. Urine analysis:    Component Value Date/Time   COLORURINE STRAW (A) 11/19/2016 1750    APPEARANCEUR CLEAR 11/19/2016 1750   LABSPEC 1.003 (L) 11/19/2016 1750   PHURINE 7.0 11/19/2016 1750   GLUCOSEU NEGATIVE 11/19/2016 1750   HGBUR NEGATIVE 11/19/2016 1750   BILIRUBINUR NEGATIVE 11/19/2016 1750   BILIRUBINUR neg 02/16/2015 1318   KETONESUR NEGATIVE 11/19/2016 1750   PROTEINUR NEGATIVE 11/19/2016 1750   UROBILINOGEN 0.2 02/16/2015 1318   UROBILINOGEN 0.2 11/18/2013 1321   NITRITE NEGATIVE 11/19/2016 1750   LEUKOCYTESUR TRACE (A) 11/19/2016 1750   Sepsis Labs: @LABRCNTIP (procalcitonin:4,lacticidven:4)  ) Recent Results (from the past 240 hour(s))  SARS CORONAVIRUS 2 (TAT 6-24 HRS) Nasopharyngeal Nasopharyngeal Swab     Status: None   Collection Time: 01/29/19  1:44 PM   Specimen: Nasopharyngeal Swab  Result Value Ref Range Status   SARS Coronavirus 2 NEGATIVE NEGATIVE Final    Comment: (NOTE) SARS-CoV-2 target nucleic acids are NOT DETECTED. The SARS-CoV-2 RNA is generally detectable in upper and lower respiratory specimens during the acute phase of infection. Negative results do not preclude SARS-CoV-2 infection, do not rule out co-infections with other pathogens, and should not be used as the sole basis for treatment or other patient management decisions. Negative results must be combined with clinical observations, patient history, and epidemiological information. The expected result is Negative. Fact Sheet for Patients: SugarRoll.be Fact Sheet for Healthcare Providers: https://www.woods-mathews.com/ This test is not yet approved or cleared by the [place] FDA and  has been authorized for detection and/or diagnosis of SARS-CoV-2 by FDA under an Emergency Use Authorization (EUA). This EUA will remain  in effect (meaning this test can be used) for the duration of the COVID-19 declaration under Section 56 4(b)(1) of the Act, 21 U.S.C. section 360bbb-3(b)(1), unless the authorization is terminated or revoked sooner.  Performed at [facility], [place] [address] [place], [place] [address]       Studies: No results found.  Scheduled Meds: . ALPRAZolam  0.25 mg Oral TID  . aspirin  300 mg Rectal Daily   Or  . aspirin  325 mg Oral Daily  . carvedilol  6.25 mg Oral BID WC  . enoxaparin (LOVENOX) injection  40 mg Subcutaneous Q24H  . feeding supplement (ENSURE ENLIVE)  237 mL Oral BID BM  . fluticasone  2 spray Each Nare Daily  . ketorolac  30 mg Intravenous Once  . loratadine  10 mg Oral Daily  . metoCLOPramide (REGLAN) injection  10 mg Intravenous Once  . ondansetron  4 mg Oral Once  . prochlorperazine  10 mg Intravenous Once  . sodium chloride flush  3 mL Intravenous Once  . spironolactone  12.5 mg Oral Daily    Continuous Infusions:   LOS: 3  days     Kayleen Memos, MD [facility] Pager [phone]  If 7PM-7AM, please contact night-coverage [email or web address] Password [email or web address] 02/02/2019, 8:54 AM

## 2019-02-03 MED ORDER — LOSARTAN POTASSIUM 25 MG PO TABS
12.5000 mg | ORAL_TABLET | Freq: Every day | ORAL | Status: DC
  Administered 2019-02-03 – 2019-02-04 (×2): 12.5 mg via ORAL
  Filled 2019-02-03 (×2): qty 1

## 2019-02-03 NOTE — Progress Notes (Signed)
PROGRESS NOTE  Erica Young POE:[id number] DOB: [birth year] DOA: 01/29/2019 PCP: Isaac Bliss, Rayford Halsted, MD  HPI/Recap of past 37 hours:  78 year old extremely anxious female with metastatic breast cancer, extensive bony metastasis was at [facility] yesterday, following her Faslodex injection which she has received multiple times in the past she reported sudden onset severe headache, dizziness followed by loss of consciousness, this lasted around 2 minutes when she regained consciousness she was noted to have a left facial droop, mild slurring and left arm, leg weakness. -Code stroke was activated, patient sent to the emergency room, she was seen by neurology, MRI was ordered, not felt to be a TPA candidate due to known intracranial lesion.  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  02/03/19: Patient was seen and examined at her bedside this morning. Conversational dyspnea noted.  States she feels too weak to care for self.  Case manager working on placement.  She has a bed offer at [facility].  Repeat COVID-19 pending.   Will obtain home O2 evaluation.  Cardiology is following with ongoing adjustment of her medications.   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  No sign of acute brain infarct. She will need ENT biopsy as recommended by oncology, this could be done outpatient. Continue supportive management  Generalized weakness with severe debility in the setting of advanced stage IV breast cancer with metastasis Patient appears very weak on exam to the point that she is unable to feel herself Continue feeding assistance Dietary consult for nutritional assistance Ordered wheelchair to assist with her weakness, she tires out with a walker Continue PT OT Continue fall precautions Continue to increase oral protein calorie intake Out of bed to chair as tolerated with every shift and fall precautions Has a bed offer at [facility] continue physical rehab Repeat COVID-19 test done on 02/03/2019 pending  Severe anxiety Very jittery on exam Continue home dose Xanax which she takes scheduled 0.25 mg 3 times daily.  Recent left dental abscess Presented on Augmentin and oral Flagyl Patient will need tooth to to be extracted Will need to follow-up with a dentist outpatient Afebrile but leukopenic with WBC of 2.4, neutrophil count 1.3. Per Dr. Jana Hakim, continue oral antibiotics until she sees her dentist.  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  Newly diagnosed acute on chronic combined CHF.   2D echo done on 01/30/2019 showed LVEF 30 to 35% with impaired relaxation of left ventricular diastolic Doppler parameters Low BNP and euvolemic on exam  Previous 2D echo on 02/21/2010 showing normal LVEF 60 to 65% Seen by cardiology, highly appreciated. Started during this admission Coreg 6.25 mg twice daily and Aldactone 12.5 mg daily, tolerated well Cardiology added on  02/03/2019 losartan 12.5 mg daily Closely monitor vital signs on cardiac medication Net I&O -1.2 L Continue strict I's and O's and daily  weight  Resolved transient tachycardia, unclear etiology Asymptomatic, denies any anginal symptoms Rate controlled on coreg 6.25 mg bid  Stage IV metastatic breast cancer with extensive bony metastasis CT chest with contrast done on 02/02/2019 showed pulmonary nodularity, right adrenal mass, possible mets to liver Follows with Dr. Ralene Ok Faslodex infusions. Hold Ibrance per Dr. Jana Hakim  Not a candidate for hospice at this time  Leukopenia likely secondary to recent chemotherapy WBC 2.4 on 02/02/2019 from 2.7 on 02/01/19 from 2.3 on 01/29/19. Continue to monitor Discussed with Dr. Jana Hakim, expected.  Severe protein calorie malnutrition in the setting of stage IV breast cancer with mets Continue oral supplement Dietary consult Encourage increase oral protein calorie intake  DVT prophylaxis: Sq lovenox daily Code Status: DNR Family Communication:  None at bedside   Disposition Plan:  Patient is currently not appropriate for discharge at this time due to cardiac medications being adjusted.  Will require close monitoring on new cardiac medications.  Possible discharge to SNF tomorrow 02/04/2019 pending covid 19 results.  Consultants:  Neurology Cardiology    Procedures:   Antimicrobials:    Objective: Vitals:   02/03/19 0405 02/03/19 0500 02/03/19 0754 02/03/19 1018  BP: 129/76  134/78 128/73  Pulse: 83  87 88  Resp: 18  (!) 22   Temp: 98.4 F (36.9 C)  (!) 97.4 F (36.3 C)   TempSrc: Oral  Oral   SpO2: 93%  93%   Weight:  63.3 kg    Height:        Intake/Output Summary (Last 24 hours) at 02/03/2019 1022 Last data filed at 02/02/2019 1606 Gross per 24 hour  Intake 240 ml  Output 600 ml  Net -360 ml   Filed Weights   02/01/19 0500 02/03/19 0500  Weight: 61.4 kg 63.3 kg    Exam:   General: 78 y.o. year-old female pleasant, frail in no acute distress.  Conversational dyspnea noted.  Alert oriented x3.  Cardiovascular: Regular rate and rhythm no  rubs or gallops no JVD or thyromegaly.  Respiratory: Clear auscultation no wheezes no rales.  Poor respiratory effort.    Abdomen: Soft nontender nondistended normal bowel sounds present.  Musculoskeletal: No lower extremity edema.  2 out of 4 pulses in all 4 extremities. Psychiatry: Mood is appropriate for condition and setting.  Data Reviewed: CBC: Recent Labs  Lab 01/29/19 0855 01/29/19 1047 01/29/19 1048 02/01/19 0643 02/02/19 0636  WBC 2.3*  --  2.3* 2.7* 2.4*  NEUTROABS 1.3*  --  1.3*  --  1.3*  HGB 12.2 11.9* 12.6 12.5 13.0  HCT 36.0 35.0* 37.8 35.6* 36.0  MCV 112.5*  --  112.5* 109.5* 109.4*  PLT 294  --  296 290 397   Basic Metabolic Panel: Recent Labs  Lab 01/29/19 0855 01/29/19 1047 01/29/19 1048 02/01/19 0643  NA 139 138 136 138  K 4.6 4.1 4.6 4.1  CL 104 106 104 105  CO2 28  --  24 25  GLUCOSE 102* 90 97 101*  BUN 20 19 20 12   CREATININE 0.77 0.60 0.72 0.76  CALCIUM 9.1  --  8.8* 8.5*   GFR: Estimated Creatinine Clearance: 50.7 mL/min (by C-G formula based on SCr of 0.76 mg/dL). Liver Function Tests: Recent Labs  Lab 01/29/19 0855 01/29/19 1048  AST 16 28  ALT 13 16  ALKPHOS 82 73  BILITOT 0.3 0.6  PROT 7.2 7.1  ALBUMIN 3.7 4.0   No results for input(s): LIPASE, AMYLASE in the last 168 hours. No results for input(s): AMMONIA in the last 168 hours. Coagulation Profile: Recent Labs  Lab 01/29/19 1048  INR 1.1   Cardiac Enzymes: No results for input(s): CKTOTAL, CKMB, CKMBINDEX, TROPONINI in the last 168 hours. BNP (last 3 results) No results for input(s): PROBNP in the last 8760 hours. HbA1C: No results for input(s): HGBA1C in the last 72 hours. CBG: Recent Labs  Lab 01/29/19 1037 01/31/19 1207  GLUCAP 96 117*   Lipid Profile: No results for input(s): CHOL, HDL, LDLCALC, TRIG, CHOLHDL, LDLDIRECT in the last 72 hours. Thyroid Function Tests: No results for input(s): TSH, T4TOTAL, FREET4, T3FREE, THYROIDAB in the last 72  hours. Anemia Panel: No results for input(s): VITAMINB12, FOLATE, FERRITIN, TIBC, IRON, RETICCTPCT in the last 72 hours. Urine analysis:    Component Value Date/Time   COLORURINE STRAW (A) 11/19/2016 1750   APPEARANCEUR CLEAR 11/19/2016 1750   LABSPEC 1.003 (L) 11/19/2016 1750   PHURINE 7.0 11/19/2016 1750   GLUCOSEU NEGATIVE 11/19/2016 1750   HGBUR NEGATIVE 11/19/2016 1750   BILIRUBINUR NEGATIVE 11/19/2016 1750   BILIRUBINUR neg 02/16/2015 1318   KETONESUR NEGATIVE 11/19/2016 1750   PROTEINUR NEGATIVE 11/19/2016 1750   UROBILINOGEN 0.2 02/16/2015 1318   UROBILINOGEN 0.2 11/18/2013 1321   NITRITE NEGATIVE 11/19/2016 1750   LEUKOCYTESUR TRACE (A) 11/19/2016 1750   Sepsis Labs: @LABRCNTIP (procalcitonin:4,lacticidven:4)  ) Recent Results (from the past 240 hour(s))  SARS CORONAVIRUS 2 (TAT 6-24 HRS) Nasopharyngeal Nasopharyngeal Swab     Status: None   Collection Time: 01/29/19  1:44 PM   Specimen: Nasopharyngeal Swab  Result Value Ref Range Status   SARS Coronavirus 2 NEGATIVE NEGATIVE Final    Comment: (NOTE) SARS-CoV-2 target nucleic acids are NOT DETECTED. The SARS-CoV-2 RNA is generally detectable in upper and lower respiratory specimens during the acute phase of infection. Negative results do not preclude SARS-CoV-2 infection, do not rule out co-infections with other pathogens, and should not be used as the sole basis for treatment or other patient management decisions. Negative results must be combined with clinical observations, patient history, and epidemiological information. The expected result is Negative. Fact Sheet for Patients: SugarRoll.be Fact Sheet for Healthcare Providers: https://www.woods-mathews.com/ This test is not yet approved or cleared by the [place] FDA and  has been authorized for detection and/or diagnosis of SARS-CoV-2 by FDA under an Emergency Use Authorization (EUA). This EUA will remain  in  effect (meaning this test can be used) for the duration of the COVID-19 declaration under Section 56 4(b)(1) of the Act, 21 U.S.C. section 360bbb-3(b)(1), unless the authorization is terminated or revoked sooner. Performed at [facility], [place] [address], [place], [place] [address]       Studies: Ct Chest W Contrast  Result Date: 02/02/2019 CLINICAL DATA:  Metastatic breast cancer.  Shortness of breath. EXAM: CT CHEST WITH CONTRAST TECHNIQUE: Multidetector CT imaging of the chest was performed during intravenous contrast administration. CONTRAST:  20mL OMNIPAQUE IOHEXOL 300 MG/ML  SOLN COMPARISON:  Chest CT 03/17/2018 and 04/24/2017. FINDINGS: Cardiovascular: Mild atherosclerosis of the aorta and coronary arteries. No evidence of acute pulmonary embolism or other acute vascular findings on non dedicated imaging. The heart size is normal. There is no pericardial effusion. Mediastinum/Nodes: Again demonstrated are multiple mildly enlarged mediastinal, right hilar and right internal mammary lymph nodes. These appear similar to most recent prior study, and  include a 16 mm AP window node on image 50/3 and a subcarinal node measuring 15 mm on image 63/3. No progressive adenopathy identified. There is no axillary adenopathy post axillary node dissection. The thyroid gland, trachea and esophagus demonstrate no significant findings. Lungs/Pleura: There is no pleural effusion or pneumothorax. There is stable mild central airway thickening and biapical scarring. The previously demonstrated clustered nodularity posteriorly in the right upper lobe has improved, likely resolving inflammation or scarring. Other nodules are stable, including a 6 mm right lower lobe nodule (image 81/4) and a subpleural nodule in the lingula measuring 12 x 11 mm on image 75/4. No new or enlarging nodules identified. Upper abdomen: No acute findings are seen within the visualized upper abdomen. There is a possible new low-density  lesion in the dome of the right hepatic lobe measuring 8 mm on image 84/3. 2.4 cm right adrenal nodule on image 130/3 has not significantly changed. Musculoskeletal/Chest wall: Stable sclerosis within the T8 vertebral body. No new osseous lesions or pathologic fractures. No evidence of chest wall mass. IMPRESSION: 1. Mediastinal, right hilar and right internal mammary adenopathy has not significantly changed, present on multiple prior studies. 2. Scattered pulmonary nodularity is stable as well. 3. Stable right adrenal metastasis. Possible new lesion medially in the dome of the right hepatic lobe, potentially a metastasis. 4. Stable osseous metastatic disease. 5. No acute chest findings. Electronically Signed   By: Richardean Sale M.D.   On: 02/02/2019 20:37    Scheduled Meds:  ALPRAZolam  0.25 mg Oral TID   amoxicillin  1,000 mg Oral BID   aspirin  300 mg Rectal Daily   Or   aspirin  325 mg Oral Daily   carvedilol  6.25 mg Oral BID WC   enoxaparin (LOVENOX) injection  40 mg Subcutaneous Q24H   feeding supplement (ENSURE ENLIVE)  237 mL Oral BID BM   fluticasone  2 spray Each Nare Daily   ketorolac  30 mg Intravenous Once   loratadine  10 mg Oral Daily   losartan  12.5 mg Oral Daily   metoCLOPramide (REGLAN) injection  10 mg Intravenous Once   metroNIDAZOLE  500 mg Oral BID   ondansetron  4 mg Oral Once   prochlorperazine  10 mg Intravenous Once   sodium chloride flush  3 mL Intravenous Once   spironolactone  12.5 mg Oral Daily    Continuous Infusions:   LOS: 4 days     Kayleen Memos, MD [facility] Pager [phone]  If 7PM-7AM, please contact night-coverage [email or web address] Password [email or web address] 02/03/2019, 10:22 AM

## 2019-02-03 NOTE — Progress Notes (Signed)
Progress Note  Patient Name: Erica Young Date of Encounter: 02/03/2019  Primary Cardiologist:  Pixie Casino, MD  Subjective   Looks much better today, breathing is better, not as anxious Says will take 3 different heart pills  Inpatient Medications    Scheduled Meds: . ALPRAZolam  0.25 mg Oral TID  . amoxicillin  1,000 mg Oral BID  . aspirin  300 mg Rectal Daily   Or  . aspirin  325 mg Oral Daily  . carvedilol  6.25 mg Oral BID WC  . enoxaparin (LOVENOX) injection  40 mg Subcutaneous Q24H  . feeding supplement (ENSURE ENLIVE)  237 mL Oral BID BM  . fluticasone  2 spray Each Nare Daily  . ketorolac  30 mg Intravenous Once  . loratadine  10 mg Oral Daily  . metoCLOPramide (REGLAN) injection  10 mg Intravenous Once  . metroNIDAZOLE  500 mg Oral BID  . ondansetron  4 mg Oral Once  . prochlorperazine  10 mg Intravenous Once  . sodium chloride flush  3 mL Intravenous Once  . spironolactone  12.5 mg Oral Daily   Continuous Infusions:  PRN Meds: acetaminophen **OR** acetaminophen (TYLENOL) oral liquid 160 mg/5 mL **OR** acetaminophen   Vital Signs    Vitals:   02/03/19 0000 02/03/19 0405 02/03/19 0500 02/03/19 0754  BP: 124/78 129/76  134/78  Pulse: 84 83  87  Resp:  18  (!) 22  Temp: 98.6 F (37 C) 98.4 F (36.9 C)  (!) 97.4 F (36.3 C)  TempSrc: Oral Oral  Oral  SpO2: 98% 93%  93%  Weight:   63.3 kg   Height:        Intake/Output Summary (Last 24 hours) at 02/03/2019 0841 Last data filed at 02/02/2019 1606 Gross per 24 hour  Intake 480 ml  Output 600 ml  Net -120 ml   Filed Weights   02/01/19 0500 02/03/19 0500  Weight: 61.4 kg 63.3 kg   Last Weight  Most recent update: 02/03/2019  5:32 AM   Weight  63.3 kg (139 lb 8.8 oz)           Weight change:    Telemetry    SR, ST - Personally Reviewed  ECG    10/01, SR, HR 79, no acute changes - Personally Reviewed  Physical Exam   General: Well developed, elderly, female in no acute distress  Head: Eyes PERRLA Normocephalic and atraumatic Lungs: decreased BS bases, few rales, to auscultation. Heart: HRRR S1 S2, without rub or gallop. No murmur.  Pulses are 2+ & equal. Minimal JVD. Abdomen: Bowel sounds are present, abdomen soft and non-tender without masses or  hernias noted. Msk: Normal strength and tone for age. Extremities: No clubbing, cyanosis or edema.    Skin:  No rashes or lesions noted. Neuro: Alert and oriented X 3. Psych:  Good affect, responds appropriately  Labs    Hematology Recent Labs  Lab 01/29/19 1048 02/01/19 0643 02/02/19 0636  WBC 2.3* 2.7* 2.4*  RBC 3.36* 3.25* 3.29*  HGB 12.6 12.5 13.0  HCT 37.8 35.6* 36.0  MCV 112.5* 109.5* 109.4*  MCH 37.5* 38.5* 39.5*  MCHC 33.3 35.1 36.1*  RDW 12.7 12.4 12.1  PLT 296 290 280    Chemistry Recent Labs  Lab 01/29/19 0855 01/29/19 1047 01/29/19 1048 02/01/19 0643  NA 139 138 136 138  K 4.6 4.1 4.6 4.1  CL 104 106 104 105  CO2 28  --  24 25  GLUCOSE 102*  90 97 101*  BUN 20 19 20 12   CREATININE 0.77 0.60 0.72 0.76  CALCIUM 9.1  --  8.8* 8.5*  PROT 7.2  --  7.1  --   ALBUMIN 3.7  --  4.0  --   AST 16  --  28  --   ALT 13  --  16  --   ALKPHOS 82  --  73  --   BILITOT 0.3  --  0.6  --   GFRNONAA >60  --  >60 >60  GFRAA >60  --  >60 >60  ANIONGAP 7  --  8 8     BNP Recent Labs  Lab 01/31/19 1154  BNP 13.4     Radiology    Ct Chest W Contrast  Result Date: 02/02/2019 CLINICAL DATA:  Metastatic breast cancer.  Shortness of breath. EXAM: CT CHEST WITH CONTRAST TECHNIQUE: Multidetector CT imaging of the chest was performed during intravenous contrast administration. CONTRAST:  67mL OMNIPAQUE IOHEXOL 300 MG/ML  SOLN COMPARISON:  Chest CT 03/17/2018 and 04/24/2017. FINDINGS: Cardiovascular: Mild atherosclerosis of the aorta and coronary arteries. No evidence of acute pulmonary embolism or other acute vascular findings on non dedicated imaging. The heart size is normal. There is no pericardial  effusion. Mediastinum/Nodes: Again demonstrated are multiple mildly enlarged mediastinal, right hilar and right internal mammary lymph nodes. These appear similar to most recent prior study, and include a 16 mm AP window node on image 50/3 and a subcarinal node measuring 15 mm on image 63/3. No progressive adenopathy identified. There is no axillary adenopathy post axillary node dissection. The thyroid gland, trachea and esophagus demonstrate no significant findings. Lungs/Pleura: There is no pleural effusion or pneumothorax. There is stable mild central airway thickening and biapical scarring. The previously demonstrated clustered nodularity posteriorly in the right upper lobe has improved, likely resolving inflammation or scarring. Other nodules are stable, including a 6 mm right lower lobe nodule (image 81/4) and a subpleural nodule in the lingula measuring 12 x 11 mm on image 75/4. No new or enlarging nodules identified. Upper abdomen: No acute findings are seen within the visualized upper abdomen. There is a possible new low-density lesion in the dome of the right hepatic lobe measuring 8 mm on image 84/3. 2.4 cm right adrenal nodule on image 130/3 has not significantly changed. Musculoskeletal/Chest wall: Stable sclerosis within the T8 vertebral body. No new osseous lesions or pathologic fractures. No evidence of chest wall mass. IMPRESSION: 1. Mediastinal, right hilar and right internal mammary adenopathy has not significantly changed, present on multiple prior studies. 2. Scattered pulmonary nodularity is stable as well. 3. Stable right adrenal metastasis. Possible new lesion medially in the dome of the right hepatic lobe, potentially a metastasis. 4. Stable osseous metastatic disease. 5. No acute chest findings. Electronically Signed   By: Richardean Sale M.D.   On: 02/02/2019 20:37     Cardiac Studies   ECHO:  01/30/2019  1. Left ventricular ejection fraction, by visual estimation, is 30 to 35%. The  left ventricle has moderate to severely decreased function. There is mildly increased left ventricular hypertrophy.  2. Multiple segmental abnormalities exist. See findings.  3. Left ventricular diastolic Doppler parameters are consistent with impaired relaxation pattern of LV diastolic filling.  4. Global right ventricle has moderately reduced systolic function.The right ventricular size is normal. No increase in right ventricular wall thickness.  5. Left atrial size was normal.  6. Right atrial size was normal.  7. The mitral  valve is grossly normal. Trace mitral valve regurgitation.  8. The tricuspid valve is grossly normal. Tricuspid valve regurgitation is trivial.  9. The aortic valve is tricuspid Aortic valve regurgitation was not visualized by color flow Doppler. 10. The pulmonic valve was grossly normal. Pulmonic valve regurgitation is not visualized by color flow Doppler. 11. Normal pulmonary artery systolic pressure. 12. The inferior vena cava is normal in size with <50% respiratory variability, suggesting right atrial pressure of 8 mmHg. 13. The interatrial septum was not well visualized.  Patient Profile     78 y.o. female w/ hx metastatic breast CA, D-CHF, was admitted 10/02 after Faslodex injection. Had HA, dizzy, syncope, ?L facial droop. 5 cm L nasal cavity mass seen on CT. Cards saw for CHF, w/ EF now 30-35%.   Assessment & Plan    1. Acute on chronic (newly) combined CHF:  - on spiro, Coreg - s/p CT w/ no sig change in CA, no fluid noted - think BP will tolerate low-dose losartan and renal function stable on spiro - add losartan 12.5 mg qd  2. Syncope - likely due to weakness - pt is to go to [facility] - no sig arrhythmia - continue BB  3. Metastatic breast CA - Dr Jana Hakim has seen and is managing chemo  Otherwise, per IM Principal Problem:   Acute left-sided weakness Active Problems:   Cancer of right breast, stage 4 (HCC)   Leukopenia due to  antineoplastic chemotherapy ([place])   Anxiety   Acute systolic (congestive) heart failure (HCC)   Shortness of breath    Jonetta Speak , PA-C 8:41 AM 02/03/2019 Pager: [phone]

## 2019-02-03 NOTE — Progress Notes (Signed)
Erica Young   DOB:1941/02/21   KA#:[id number]   OMB#:[id number]  Subjective:  Erica Young continues to have a lot of fatigue. Planning to go to SNF for rehabilitation. Having a lot of stress over taking care of husband at home who is also undergoing treatment for cancer. Has some mile dyspnea with exertion. No recurrent facial droop, slurring of speech, or left sided weakness.    Objective: older latin woman examined in bed Vitals:   02/03/19 1018 02/03/19 1320  BP: 128/73 118/64  Pulse: 88 82  Resp:  15  Temp:  98 F (36.7 C)  SpO2:  94%    Body mass index is 25.52 kg/m.  Intake/Output Summary (Last 24 hours) at 02/03/2019 1452 Last data filed at 02/02/2019 1606 Gross per 24 hour  Intake -  Output 600 ml  Net -600 ml      CBG (last 3)  No results for input(s): GLUCAP in the last 72 hours.   Labs:  Lab Results  Component Value Date   WBC 2.4 (L) 02/02/2019   HGB 13.0 02/02/2019   HCT 36.0 02/02/2019   MCV 109.4 (H) 02/02/2019   PLT 280 02/02/2019   NEUTROABS 1.3 (L) 02/02/2019    _0 @  Urine Studies No results for input(s): UHGB, CRYS in the last 72 hours.  Invalid input(s): UACOL, UAPR, USPG, UPH, UTP, UGL, UKET, UBIL, UNIT, UROB, ULEU, UEPI, UWBC, [place], [place]  Basic Metabolic Panel: Recent Labs  Lab 01/29/19 0855 01/29/19 1047 01/29/19 1048 02/01/19 0643  NA 139 138 136 138  K 4.6 4.1 4.6 4.1  CL 104 106 104 105  CO2 28  --  24 25  GLUCOSE 102* 90 97 101*  BUN _1 CREATININE 0.77 0.60 0.72 0.76  CALCIUM 9.1  --  8.8* 8.5*   GFR Estimated Creatinine Clearance: 50.7 mL/min (by C-G formula based on SCr of 0.76 mg/dL). Liver Function Tests: Recent Labs  Lab 01/29/19 0855 01/29/19 1048  AST 16 28  ALT 13 16  ALKPHOS 82 73  BILITOT 0.3 0.6  PROT 7.2 7.1  ALBUMIN 3.7 4.0   No results for input(s): LIPASE, AMYLASE in the last 168 hours. No results for input(s): AMMONIA in the last 168 hours. Coagulation  profile Recent Labs  Lab 01/29/19 1048  INR 1.1    CBC: Recent Labs  Lab 01/29/19 0855 01/29/19 1047 01/29/19 1048 02/01/19 0643 02/02/19 0636  WBC 2.3*  --  2.3* 2.7* 2.4*  NEUTROABS 1.3*  --  1.3*  --  1.3*  HGB 12.2 11.9* 12.6 12.5 13.0  HCT 36.0 35.0* 37.8 35.6* 36.0  MCV 112.5*  --  112.5* 109.5* 109.4*  PLT 294  --  296 290 280   Cardiac Enzymes: No results for input(s): CKTOTAL, CKMB, CKMBINDEX, TROPONINI in the last 168 hours. BNP: Invalid input(s): POCBNP CBG: Recent Labs  Lab 01/29/19 1037 01/31/19 1207  GLUCAP 96 117*   D-Dimer No results for input(s): DDIMER in the last 72 hours. Hgb A1c No results for input(s): HGBA1C in the last 72 hours. Lipid Profile No results for input(s): CHOL, HDL, LDLCALC, TRIG, CHOLHDL, LDLDIRECT in the last 72 hours. Thyroid function studies No results for input(s): TSH, T4TOTAL, T3FREE, THYROIDAB in the last 72 hours.  Invalid input(s): FREET3 Anemia work up No results for input(s): VITAMINB12, FOLATE, FERRITIN, TIBC, IRON, RETICCTPCT in the last 72 hours. Microbiology Recent Results (from the past 240 hour(s))  SARS CORONAVIRUS 2 (TAT 6-24 HRS) Nasopharyngeal  Nasopharyngeal Swab     Status: None   Collection Time: 01/29/19  1:44 PM   Specimen: Nasopharyngeal Swab  Result Value Ref Range Status   SARS Coronavirus 2 NEGATIVE NEGATIVE Final    Comment: (NOTE) SARS-CoV-2 target nucleic acids are NOT DETECTED. The SARS-CoV-2 RNA is generally detectable in upper and lower respiratory specimens during the acute phase of infection. Negative results do not preclude SARS-CoV-2 infection, do not rule out co-infections with other pathogens, and should not be used as the sole basis for treatment or other patient management decisions. Negative results must be combined with clinical observations, patient history, and epidemiological information. The expected result is Negative. Fact Sheet for  Patients: SugarRoll.be Fact Sheet for Healthcare Providers: https://www.woods-mathews.com/ This test is not yet approved or cleared by the [place] FDA and  has been authorized for detection and/or diagnosis of SARS-CoV-2 by FDA under an Emergency Use Authorization (EUA). This EUA will remain  in effect (meaning this test can be used) for the duration of the COVID-19 declaration under Section 56 4(b)(1) of the Act, 21 U.S.C. section 360bbb-3(b)(1), unless the authorization is terminated or revoked sooner. Performed at [facility], [place] [address], [place], [place] [address]       Studies:  Ct Angio Head W/cm &/or Wo Cm  Result Date: 01/29/2019 CLINICAL DATA:  Left-sided weakness, slurred speech, headache. EXAM: CT ANGIOGRAPHY HEAD AND NECK TECHNIQUE: Multidetector CT imaging of the head and neck was performed using the standard protocol during bolus administration of intravenous contrast. Multiplanar CT image reconstructions and MIPs were obtained to evaluate the vascular anatomy. Carotid stenosis measurements (when applicable) are obtained utilizing NASCET criteria, using the distal internal carotid diameter as the denominator. CONTRAST:  13m OMNIPAQUE IOHEXOL 350 MG/ML SOLN COMPARISON:  Noncontrast head CT 11/29/2018, brain MRI 11/11/2014, MRA head neck 12/19/2009, nuclear medicine bone scan 03/17/2018 FINDINGS: CTA NECK FINDINGS Aortic arch: Common origin of the innominate and left common carotid arteries. Included portions of the aortic arch demonstrate no evidence of dissection or aneurysm. Mild atherosclerotic calcification of the visualized aortic arch. Right carotid system: CCA and ICA widely patent within the neck without significant stenosis. Left carotid system: CCA and ICA widely patent within the neck without significant stenosis. Vertebral arteries: The right vertebral artery is slightly dominant. Streak artifact from a dense  left-sided contrast bolus limits evaluation of the V1 and proximal V2 left vertebral artery. Within this limitation, the bilateral vertebral arteries are patent within the neck without significant stenosis (50% or greater). Skeleton: Mild cervical spondylosis. Known right parietal calvarial metastasis. Please refer to previous nuclear medicine bone scan 03/17/2018 for description of additional osseous metastases. Other neck: No mass or enlarged lymph nodes. The thyroid gland is mildly heterogeneous but otherwise unremarkable. Upper chest: No consolidation within the imaged lung apices. Review of the MIP images confirms the above findings CTA HEAD FINDINGS Anterior circulation: Mild scattered soft and calcified plaque within the intracranial internal carotid arteries. The intracranial internal carotid arteries are patent bilaterally without significant stenosis. Right middle and anterior cerebral arteries patent without high-grade proximal stenosis. Left middle and anterior cerebral arteries patent without high-grade proximal stenosis. 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 A tiny 1 mm aneurysm arising from a right M2 branch vessel is also questioned (series 7, image 230) (series 9, image 63). Posterior circulation: Intracranial vertebral arteries are patent without high-grade stenosis.  Basilar artery is patent without high-grade stenosis. Bilateral posterior cerebral arteries patent without high-grade proximal stenosis. Predominantly fetal origin of the right posterior cerebral artery. Venous sinuses: Within limitations of contrast timing, no convincing thrombus. Anatomic variants: None significant Other: Again demonstrated is a mass centered within the region of the left cribriform plate and left ethmoid sinuses. The mass measures approximately 4.1 x 3.0 x 5.0 cm (AP x TV x CC) . There is associated expansile bony  remodeling with osseous erosion through the cribriform plate into the intracranial compartment. There is also lateral bowing/erosion of the left lamina papyracea. The mass extends into the left nasal passage and partly into the left maxillary sinus. Associated complete a opacification of obstructed left frontal and left sphenoid sinuses. There is also extensive partial opacification of bilateral ethmoid air cells. Significant rightward deviation of the bony nasal septum. Previously this mass measured 2.8 x 2.8 x 2.8 cm on MRI 11/11/2014. Review of the MIP images confirms the above findings IMPRESSION: CTA head: 1. No intracranial large vessel occlusion or proximal high-grade arterial stenosis identified. 2. 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 3. Question tiny 1 mm aneurysm arising from a right M2 branch vessel, as described. 4. 4.1 x 3.0 x 5.0 cm mass centered in the region of the left cribriform plate/left ethmoid sinuses with expansile bony remodeling and osseous erosion as described. As before, there is erosion of the cribriform plate with some extension into the intracranial compartment. Although non-specific, this mass may reflect a metastasis and has increased in size since MRI 11/11/2014. CTA neck: 1. The bilateral common and cervical internal carotid arteries are widely patent without significant stenosis 2. Streak artifact from a dense left-sided contrast bolus limits evaluation of the V1 and proximal V2 left vertebral artery. Within this limitation, the bilateral cervical vertebral arteries are patent without significant stenosis. 3. Mild atherosclerotic disease within the visualized aortic arch. Electronically Signed   By: Kellie Simmering   On: 01/29/2019 12:25   Ct Angio Neck W And/or Wo Contrast  Result Date: 01/29/2019 CLINICAL DATA:  Left-sided weakness, slurred speech, headache. EXAM: CT ANGIOGRAPHY HEAD AND NECK TECHNIQUE: Multidetector CT  imaging of the head and neck was performed using the standard protocol during bolus administration of intravenous contrast. Multiplanar CT image reconstructions and MIPs were obtained to evaluate the vascular anatomy. Carotid stenosis measurements (when applicable) are obtained utilizing NASCET criteria, using the distal internal carotid diameter as the denominator. CONTRAST:  151m OMNIPAQUE IOHEXOL 350 MG/ML SOLN COMPARISON:  Noncontrast head CT 11/29/2018, brain MRI 11/11/2014, MRA head neck 12/19/2009, nuclear medicine bone scan 03/17/2018 FINDINGS: CTA NECK FINDINGS Aortic arch: Common origin of the innominate and left common carotid arteries. Included portions of the aortic arch demonstrate no evidence of dissection or aneurysm. Mild atherosclerotic calcification of the visualized aortic arch. Right carotid system: CCA and ICA widely patent within the neck without significant stenosis. Left carotid system: CCA and ICA widely patent within the neck without significant stenosis. Vertebral arteries: The right vertebral artery is slightly dominant. Streak artifact from a dense left-sided contrast bolus limits evaluation of the V1 and proximal V2 left vertebral artery. Within this limitation, the bilateral vertebral arteries are patent within the neck without significant stenosis (50% or greater). Skeleton: Mild cervical spondylosis. Known right parietal calvarial metastasis. Please refer to previous nuclear medicine bone scan 03/17/2018 for description of additional osseous metastases. Other neck: No mass or enlarged lymph nodes. The thyroid  gland is mildly heterogeneous but otherwise unremarkable. Upper chest: No consolidation within the imaged lung apices. Review of the MIP images confirms the above findings CTA HEAD FINDINGS Anterior circulation: Mild scattered soft and calcified plaque within the intracranial internal carotid arteries. The intracranial internal carotid arteries are patent bilaterally without  significant stenosis. Right middle and anterior cerebral arteries patent without high-grade proximal stenosis. Left middle and anterior cerebral arteries patent without high-grade proximal stenosis. 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 A tiny 1 mm aneurysm arising from a right M2 branch vessel is also questioned (series 7, image 230) (series 9, image 63). Posterior circulation: Intracranial vertebral arteries are patent without high-grade stenosis. Basilar artery is patent without high-grade stenosis. Bilateral posterior cerebral arteries patent without high-grade proximal stenosis. Predominantly fetal origin of the right posterior cerebral artery. Venous sinuses: Within limitations of contrast timing, no convincing thrombus. Anatomic variants: None significant Other: Again demonstrated is a mass centered within the region of the left cribriform plate and left ethmoid sinuses. The mass measures approximately 4.1 x 3.0 x 5.0 cm (AP x TV x CC) . There is associated expansile bony remodeling with osseous erosion through the cribriform plate into the intracranial compartment. There is also lateral bowing/erosion of the left lamina papyracea. The mass extends into the left nasal passage and partly into the left maxillary sinus. Associated complete a opacification of obstructed left frontal and left sphenoid sinuses. There is also extensive partial opacification of bilateral ethmoid air cells. Significant rightward deviation of the bony nasal septum. Previously this mass measured 2.8 x 2.8 x 2.8 cm on MRI 11/11/2014. Review of the MIP images confirms the above findings IMPRESSION: CTA head: 1. No intracranial large vessel occlusion or proximal high-grade arterial stenosis identified. 2. 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 3. Question tiny 1 mm aneurysm arising  from a right M2 branch vessel, as described. 4. 4.1 x 3.0 x 5.0 cm mass centered in the region of the left cribriform plate/left ethmoid sinuses with expansile bony remodeling and osseous erosion as described. As before, there is erosion of the cribriform plate with some extension into the intracranial compartment. Although non-specific, this mass may reflect a metastasis and has increased in size since MRI 11/11/2014. CTA neck: 1. The bilateral common and cervical internal carotid arteries are widely patent without significant stenosis 2. Streak artifact from a dense left-sided contrast bolus limits evaluation of the V1 and proximal V2 left vertebral artery. Within this limitation, the bilateral cervical vertebral arteries are patent without significant stenosis. 3. Mild atherosclerotic disease within the visualized aortic arch. Electronically Signed   By: Kellie Simmering   On: 01/29/2019 12:25   Ct Chest W Contrast  Result Date: 02/02/2019 CLINICAL DATA:  Metastatic breast cancer.  Shortness of breath. EXAM: CT CHEST WITH CONTRAST TECHNIQUE: Multidetector CT imaging of the chest was performed during intravenous contrast administration. CONTRAST:  35m OMNIPAQUE IOHEXOL 300 MG/ML  SOLN COMPARISON:  Chest CT 03/17/2018 and 04/24/2017. FINDINGS: Cardiovascular: Mild atherosclerosis of the aorta and coronary arteries. No evidence of acute pulmonary embolism or other acute vascular findings on non dedicated imaging. The heart size is normal. There is no pericardial effusion. Mediastinum/Nodes: Again demonstrated are multiple mildly enlarged mediastinal, right hilar and right internal mammary lymph nodes. These appear similar to most recent prior study, and include a 16 mm AP  window node on image 50/3 and a subcarinal node measuring 15 mm on image 63/3. No progressive adenopathy identified. There is no axillary adenopathy post axillary node dissection. The thyroid gland, trachea and esophagus demonstrate no significant  findings. Lungs/Pleura: There is no pleural effusion or pneumothorax. There is stable mild central airway thickening and biapical scarring. The previously demonstrated clustered nodularity posteriorly in the right upper lobe has improved, likely resolving inflammation or scarring. Other nodules are stable, including a 6 mm right lower lobe nodule (image 81/4) and a subpleural nodule in the lingula measuring 12 x 11 mm on image 75/4. No new or enlarging nodules identified. Upper abdomen: No acute findings are seen within the visualized upper abdomen. There is a possible new low-density lesion in the dome of the right hepatic lobe measuring 8 mm on image 84/3. 2.4 cm right adrenal nodule on image 130/3 has not significantly changed. Musculoskeletal/Chest wall: Stable sclerosis within the T8 vertebral body. No new osseous lesions or pathologic fractures. No evidence of chest wall mass. IMPRESSION: 1. Mediastinal, right hilar and right internal mammary adenopathy has not significantly changed, present on multiple prior studies. 2. Scattered pulmonary nodularity is stable as well. 3. Stable right adrenal metastasis. Possible new lesion medially in the dome of the right hepatic lobe, potentially a metastasis. 4. Stable osseous metastatic disease. 5. No acute chest findings. Electronically Signed   By: Richardean Sale M.D.   On: 02/02/2019 20:37   Mr Brain Wo Contrast  Result Date: 01/29/2019 CLINICAL DATA:  78 year old female with code stroke presentation. Sudden onset left side weakness at 0955 hours. Slurred speech and headache. 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 EXAM: MRI HEAD WITHOUT CONTRAST TECHNIQUE: Multiplanar, multiecho pulse sequences of the brain and surrounding structures were obtained without intravenous contrast. COMPARISON:  CT head, CTA head and neck earlier today. Brain MRI 11/01/2014. FINDINGS: Brain: No restricted diffusion to suggest acute infarction. No midline shift,  mass effect, evidence of mass lesion, ventriculomegaly, extra-axial collection or acute intracranial hemorrhage. Cervicomedullary junction and pituitary are within normal limits. Mild for age scattered subcortical cerebral white matter T2 and FLAIR hyperintensity in both hemispheres (series 6, image 18) has mildly increased since 2016. There could be a subtle area of right superior sensory strip encephalomalacia on image 21 which is chronic. No evidence of vasogenic edema. No other cortical encephalomalacia. No chronic cerebral blood products. The deep gray nuclei and brainstem are negative. The deep gray nuclei, brainstem and cerebellum are negative; probable small crossing right cerebellar vessel rather than tiny chronic lacune on series 8, image 31. Vascular: Major intracranial vascular flow voids are stable since 2016. Skull and upper cervical spine: Regressed right posterior skull metastasis since 2016 on series 6, image 14. heterogeneous bone marrow signal elsewhere with no destructive osseous lesion identified. Negative visible cervical spine. Sinuses/Orbits: Large and chronic but substantially increased heterogeneously T2 hyperintense mass centered in the left nasal cavity now estimated up to 47 millimeters diameter (29 millimeters in 2016). Subsequent left side paranasal sinus obstruction with diffuse retained secretions and mucosal thickening. Interestingly, the mass demonstrates facilitated diffusion (series 901, image 36). No contrast administered today. Previously the mass was homogeneously enhancing. The right ethmoid air cells are also obstructed and opacified, but the remaining right paranasal sinuses are well pneumatized. The bilateral orbits soft tissues remain normal. Other: Grossly negative visible internal auditory structures. Chronic right mastoid effusion is stable. Left mastoids remain clear. Negative scalp soft tissues. IMPRESSION: 1. Negative for acute  infarct, and no significant change in  the noncontrast MRI appearance of the brain since 2016. 2. 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 Subsequent paranasal sinus obstruction and left-side sinus mucocele formation. 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 Follow-up with ENT recommended. 3. Regressed right parietal bone metastasis since 2016. Electronically Signed   By: Genevie Ann M.D.   On: 01/29/2019 22:47   Mr Brain W Contrast  Result Date: 01/30/2019 CLINICAL DATA:  Follow-up abnormal brain MRI EXAM: MRI HEAD WITH CONTRAST TECHNIQUE: Multiplanar, multiecho pulse sequences of the brain and surrounding structures were obtained with intravenous contrast. CONTRAST:  25m GADAVIST GADOBUTROL 1 MMOL/ML IV SOLN COMPARISON:  Brain MRI from yesterday FINDINGS: Brain: No evidence of hematogenous metastasis to the brain. Vascular: Major vessels are enhancing Skull and upper cervical spine: Decreased enhancement and expansion of right parietal bone metastasis seen on comparison. There is only minimal enhancement today and no dural distortion. Sinuses/Orbits: Large mass centered in the left nasal cavity with cribriform plate extension and dural distortion. Mass measures up to 5 cm craniocaudal and obstructs left-sided sinuses. There is extensive bony erosion at the level of the mass, which has significantly enlarged since 2016. No visible brain invasion. IMPRESSION: 1. 5 cm left nasal cavity mass extending to the cribriform plate and causing postobstructive sinusitis. Given metastatic disease elsewhere has responded in this lesion has enlarged, favor a primary nasal mass rather than metastatic disease. 2. No worrisome findings at the regressed right parietal bone metastasis. 3. No evidence of metastasis to the brain. Electronically Signed    By: JMonte FantasiaM.D.   On: 01/30/2019 08:13   Ct Head Code Stroke Wo Contrast  Result Date: 01/29/2019 CLINICAL DATA:  Code stroke. Cerebral hemorrhage suspected, possible stroke. [facility] Slurred speech. EXAM: CT HEAD WITHOUT CONTRAST TECHNIQUE: Contiguous axial images were obtained from the base of the skull through the vertex without intravenous contrast. COMPARISON:  Report from head CT 04/11/2015 (images unavailable), brain MRI [id number] FINDINGS: Brain: No evidence of acute intracranial hemorrhage. No demarcated cortical infarction. No midline shift or extra-axial fluid collection. Mild generalized parenchymal atrophy. Partially empty sella turcica. Vascular: No definite hyperdense vessel. Skull: No calvarial fracture. Irregular lucency within the right parietal bone consistent with known right parietal bone metastasis. Sinuses/Orbits: Incompletely imaged partially calcified mass centered in the region of the left cribriform plate and left ethmoid sinuses. As before, there is erosion through the cribriform plate with intracranial extension. Associated bony expansion and remodeling. This includes redemonstrated lateral bowing/erosion of the left lamina papyracea. The mass extends partly into the left nasal passage and left maxillary sinus. Associated complete opacification of the left frontal and sphenoid sinuses. The mass measures 3.8 x 3.1 cm (AP x TV) and at least 3.3 cm in craniocaudal dimension (series 2, image 1) (series 6, image 27). On prior brain MRI 11/11/2014, this mass measured 2.8 x 2.8 x 2.8 cm ASPECTS ([facility]) - Ganglionic level infarction (caudate, lentiform nuclei, internal capsule, insula, M1-M3 cortex): 7 - Supraganglionic infarction (M4-M6 cortex): 3 Total score (0-10 with 10 being normal): 10 These results were called by telephone at the time of interpretation on  01/29/2019 at 10:53 am to  provider [facility] , who verbally acknowledged these results. IMPRESSION: 1. No evidence of intracranial hemorrhage or acute demarcated cortical infarction 2. Incompletely imaged partially calcified mass centered in the region of the left cribriform plate and left ethmoid sinuses. As before, there is erosion through the cribriform plate with intracranial extension. The mass measures 3.8 x 3.1 cm (AP x TV) and at least 3.3 cm in craniocaudal dimension (previously 2.8 x 2.8 x 2.8 cm on MRI 11/11/2014). Complete opacification of obstructed left frontal and left sphenoid sinuses. 3. Known right parietal bone metastasis. Electronically Signed   By: Kellie Simmering   On: 01/29/2019 11:06     Assessment: 78 y.o. 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 (a) bone scan and chest CT 07/09/2016 shows stable disease. (b) CA-27-29 is moderately informative (c) bone scan and CT scan of the chest 04/24/2017 shows essentially stable disease  (3) on Arimidex between September 2015 and June 2017, with evidence of response; discontinuedsecondary to side effects (a) bone density 04/10/2016 shows osteopenia with a T score of -2.1  (4) on monthly denosumab/Xgeva between 07/30/2014 and 10/04/2015, discontinueddue to patient's concerns regarding osteonecrosis of the jaw (a)  discussed again October 2017, the patient adamantly refusing denosumab  (5) started fulvestrant 01/26/2016  (a) Palbociclibadded at 75 mg M/W/F, beginning mid February 2018 (b) chest CT scan obtained 12/02/2017 shows small but measurable growth of lung lesions; bones are stable (c) palbociclib dose increased to 75 mg daily 21/7 beginning 12/23/2017     Plan:  Erica Young is is improved overall, but remains fatigued. Planning to going to SNF for rehabilitation. Feels unable to take care of husband at home at this time. She voiced concern today that her husband has missed his radiation treatments this week and also missed appointment with Medical Oncology to discuss systemic treatment options. I am working with her Doctor, hospital and Social Work at the [facility] to assist with rescheduling appointments and transportation so that [place] does not need to worry about this.   I discussed the CT of the chest with Erica Young which overall shows stable disease. There is a questionable new lesion in the right dome of the liver which is too small to characterize at this point. Will follow closely on future scans. We again discussed holding Ibrance until her next visit with Medical Oncology on 02/26/2019.   She has a left dental abscess which has caused her symptoms and she will need an extraction at some point. She will also need ENT Bx of the left nasal mass (which was noted in 2016 and irradiated but never biopsied). That also can be done as outpatient.   Mikey Bussing, NP 02/03/2019  2:52 PM Medical Oncology and Hematology [facility] [address] [place], [place] [address] Tel. [phone]    Fax. [phone]

## 2019-02-03 NOTE — Progress Notes (Signed)
Physical Therapy Treatment Patient Details Name: Erica Young MRN: [id number] DOB: 05-28-1940 Today's Date: 02/03/2019    History of Present Illness Pt is a 78 y/o female admitted secondary to L sided weakness and facial droop. 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 PMH including but not limited to metastatic breast cancer, anxiety, patent foramen ovale, hypertension, hyperlipidemia, chronic headaches.    PT Comments    Pt performed gt training and functional mobility during session this pm.  She would really benefit from a RW but refused to use one.  Pt with intermittent furniture cruising.  Plan for progression to stand exercises to improve strength and function before returning home.     Follow Up Recommendations  Home health PT     Equipment Recommendations       Recommendations for Other Services       Precautions / Restrictions Precautions Precautions: Fall Restrictions Weight Bearing Restrictions: No    Mobility  Bed Mobility Overal bed mobility: Modified Independent             General bed mobility comments: Increased time and effort with heavy reliance on railing.  Transfers Overall transfer level: Needs assistance Equipment used: None;1 person hand held assist Transfers: Sit to/from Stand Sit to Stand: Min guard         General transfer comment: min guard while pulling on hand of PTA for support.  Ambulation/Gait Ambulation/Gait assistance: Min guard Gait Distance (Feet): 100 Feet Assistive device: None;1 person hand held assist Gait Pattern/deviations: Decreased stride length;Step-through pattern;Narrow base of support     General Gait Details: pt with very slow, cautious and guarded gait; no LOB or physical assistance needed.  Pt with intermittent furniture cruising but refused to use a RW.   Stairs Stairs: Yes Stairs assistance: Min guard Stair Management:  Forwards;Two rails;Alternating pattern Number of Stairs: 4 General stair comments: min guard for safety; pt very slow and cautious   Wheelchair Mobility    Modified Rankin (Stroke Patients Only)       Balance Overall balance assessment: Needs assistance Sitting-balance support: Feet supported Sitting balance-Leahy Scale: Good       Standing balance-Leahy Scale: Poor                              Cognition Arousal/Alertness: Awake/alert Behavior During Therapy: Anxious Overall Cognitive Status: Within Functional Limits for tasks assessed Area of Impairment: Memory                     Memory: Decreased short-term memory         General Comments: Pt appears very anxious regarding her ability to care for herself and husband at home. Pt reports "seeing faces" when she closes her eyes.      Exercises      General Comments        Pertinent Vitals/Pain Pain Assessment: Faces Faces Pain Scale: Hurts little more Pain Location: legs Pain Descriptors / Indicators: Discomfort;Sore Pain Intervention(s): Repositioned    Home Living                      Prior Function            PT Goals (current goals can now be found in the care plan section) Acute Rehab PT Goals Patient Stated Goal: to be independent Potential to Achieve  Goals: Good Progress towards PT goals: Progressing toward goals    Frequency    Min 3X/week      PT Plan Current plan remains appropriate    Co-evaluation              AM-PAC PT "6 Clicks" Mobility   Outcome Measure  Help needed turning from your back to your side while in a flat bed without using bedrails?: None Help needed moving from lying on your back to sitting on the side of a flat bed without using bedrails?: None Help needed moving to and from a bed to a chair (including a wheelchair)?: A Little Help needed standing up from a chair using your arms (e.g., wheelchair or bedside chair)?: A  Little Help needed to walk in hospital room?: A Little Help needed climbing 3-5 steps with a railing? : A Little 6 Click Score: 20    End of Session Equipment Utilized During Treatment: Gait belt Activity Tolerance: Patient tolerated treatment well Patient left: with call bell/phone within reach;Other (comment) Nurse Communication: Mobility status PT Visit Diagnosis: Other abnormalities of gait and mobility (R26.89);Other symptoms and signs involving the nervous system (R29.898)     Time: 7867-5449 PT Time Calculation (min) (ACUTE ONLY): 15 min  Charges:  $Gait Training: 8-22 mins                     Governor Rooks, PTA Acute Rehabilitation Services Pager [phone] Office [phone]     Lyrique Hakim Eli Hose 02/03/2019, 4:50 PM

## 2019-02-04 DIAGNOSIS — Z170 Estrogen receptor positive status [ER+]: Secondary | ICD-10-CM | POA: Diagnosis not present

## 2019-02-04 DIAGNOSIS — R0602 Shortness of breath: Secondary | ICD-10-CM | POA: Diagnosis not present

## 2019-02-04 DIAGNOSIS — C50911 Malignant neoplasm of unspecified site of right female breast: Secondary | ICD-10-CM | POA: Diagnosis not present

## 2019-02-04 DIAGNOSIS — R5381 Other malaise: Secondary | ICD-10-CM | POA: Diagnosis not present

## 2019-02-04 DIAGNOSIS — Z7401 Bed confinement status: Secondary | ICD-10-CM | POA: Diagnosis not present

## 2019-02-04 DIAGNOSIS — M2550 Pain in unspecified joint: Secondary | ICD-10-CM | POA: Diagnosis not present

## 2019-02-04 DIAGNOSIS — D72819 Decreased white blood cell count, unspecified: Secondary | ICD-10-CM | POA: Diagnosis not present

## 2019-02-04 DIAGNOSIS — I509 Heart failure, unspecified: Secondary | ICD-10-CM | POA: Diagnosis not present

## 2019-02-04 DIAGNOSIS — Z7189 Other specified counseling: Secondary | ICD-10-CM | POA: Diagnosis not present

## 2019-02-04 DIAGNOSIS — E43 Unspecified severe protein-calorie malnutrition: Secondary | ICD-10-CM | POA: Diagnosis not present

## 2019-02-04 DIAGNOSIS — R55 Syncope and collapse: Secondary | ICD-10-CM

## 2019-02-04 DIAGNOSIS — I700 Atherosclerosis of aorta: Secondary | ICD-10-CM | POA: Diagnosis not present

## 2019-02-04 DIAGNOSIS — F419 Anxiety disorder, unspecified: Secondary | ICD-10-CM | POA: Diagnosis not present

## 2019-02-04 DIAGNOSIS — W19XXXA Unspecified fall, initial encounter: Secondary | ICD-10-CM | POA: Diagnosis not present

## 2019-02-04 DIAGNOSIS — R5382 Chronic fatigue, unspecified: Secondary | ICD-10-CM | POA: Diagnosis not present

## 2019-02-04 DIAGNOSIS — I5041 Acute combined systolic (congestive) and diastolic (congestive) heart failure: Secondary | ICD-10-CM | POA: Diagnosis not present

## 2019-02-04 DIAGNOSIS — G459 Transient cerebral ischemic attack, unspecified: Secondary | ICD-10-CM | POA: Diagnosis not present

## 2019-02-04 DIAGNOSIS — K047 Periapical abscess without sinus: Secondary | ICD-10-CM | POA: Diagnosis not present

## 2019-02-04 DIAGNOSIS — C7800 Secondary malignant neoplasm of unspecified lung: Secondary | ICD-10-CM | POA: Diagnosis not present

## 2019-02-04 DIAGNOSIS — E785 Hyperlipidemia, unspecified: Secondary | ICD-10-CM | POA: Diagnosis not present

## 2019-02-04 DIAGNOSIS — R627 Adult failure to thrive: Secondary | ICD-10-CM | POA: Diagnosis not present

## 2019-02-04 DIAGNOSIS — H259 Unspecified age-related cataract: Secondary | ICD-10-CM | POA: Diagnosis not present

## 2019-02-04 DIAGNOSIS — M6281 Muscle weakness (generalized): Secondary | ICD-10-CM | POA: Diagnosis not present

## 2019-02-04 DIAGNOSIS — C50919 Malignant neoplasm of unspecified site of unspecified female breast: Secondary | ICD-10-CM | POA: Diagnosis not present

## 2019-02-04 DIAGNOSIS — C7951 Secondary malignant neoplasm of bone: Secondary | ICD-10-CM | POA: Diagnosis not present

## 2019-02-04 DIAGNOSIS — R2689 Other abnormalities of gait and mobility: Secondary | ICD-10-CM | POA: Diagnosis not present

## 2019-02-04 DIAGNOSIS — I69891 Dysphagia following other cerebrovascular disease: Secondary | ICD-10-CM | POA: Diagnosis not present

## 2019-02-04 DIAGNOSIS — I5021 Acute systolic (congestive) heart failure: Secondary | ICD-10-CM | POA: Diagnosis not present

## 2019-02-04 DIAGNOSIS — C311 Malignant neoplasm of ethmoidal sinus: Secondary | ICD-10-CM | POA: Diagnosis not present

## 2019-02-04 DIAGNOSIS — I10 Essential (primary) hypertension: Secondary | ICD-10-CM | POA: Diagnosis not present

## 2019-02-04 DIAGNOSIS — J99 Respiratory disorders in diseases classified elsewhere: Secondary | ICD-10-CM | POA: Diagnosis not present

## 2019-02-04 DIAGNOSIS — C50411 Malignant neoplasm of upper-outer quadrant of right female breast: Secondary | ICD-10-CM | POA: Diagnosis not present

## 2019-02-04 DIAGNOSIS — R531 Weakness: Secondary | ICD-10-CM | POA: Diagnosis not present

## 2019-02-04 LAB — CBC
HCT: 35.3 % — ABNORMAL LOW (ref 36.0–46.0)
Hemoglobin: 12.6 g/dL (ref 12.0–15.0)
MCH: 39.3 pg — ABNORMAL HIGH (ref 26.0–34.0)
MCHC: 35.7 g/dL (ref 30.0–36.0)
MCV: 110 fL — ABNORMAL HIGH (ref 80.0–100.0)
Platelets: 237 10*3/uL (ref 150–400)
RBC: 3.21 MIL/uL — ABNORMAL LOW (ref 3.87–5.11)
RDW: 12.1 % (ref 11.5–15.5)
WBC: 3 10*3/uL — ABNORMAL LOW (ref 4.0–10.5)
nRBC: 0 % (ref 0.0–0.2)

## 2019-02-04 LAB — BASIC METABOLIC PANEL
Anion gap: 9 (ref 5–15)
BUN: 17 mg/dL (ref 8–23)
CO2: 25 mmol/L (ref 22–32)
Calcium: 8.7 mg/dL — ABNORMAL LOW (ref 8.9–10.3)
Chloride: 103 mmol/L (ref 98–111)
Creatinine, Ser: 0.82 mg/dL (ref 0.44–1.00)
GFR calc Af Amer: >60 mL/min (ref >60)
GFR calc non Af Amer: >60 mL/min (ref >60)
Glucose, Bld: 112 mg/dL — ABNORMAL HIGH (ref 70–99)
Potassium: 4.2 mmol/L (ref 3.5–5.1)
Sodium: 137 mmol/L (ref 135–145)

## 2019-02-04 LAB — NOVEL CORONAVIRUS, NAA (HOSP ORDER, SEND-OUT TO REF LAB; TAT 18-24 HRS): SARS-CoV-2, NAA: NOT DETECTED

## 2019-02-04 MED ORDER — CARVEDILOL 12.5 MG PO TABS
12.5000 mg | ORAL_TABLET | Freq: Two times a day (BID) | ORAL | Status: DC
  Administered 2019-02-04: 12.5 mg via ORAL
  Filled 2019-02-04: qty 1

## 2019-02-04 MED ORDER — AMOXICILLIN-POT CLAVULANATE 875-125 MG PO TABS: 1.0000 | ORAL_TABLET | Freq: Two times a day (BID) | ORAL | 0 refills | Status: AC

## 2019-02-04 MED ORDER — LOSARTAN POTASSIUM 25 MG PO TABS: 12.5000 mg | ORAL_TABLET | Freq: Every day | ORAL | 2 refills | Status: DC

## 2019-02-04 MED ORDER — ALPRAZOLAM 0.25 MG PO TABS: 0.2500 mg | ORAL_TABLET | Freq: Three times a day (TID) | ORAL | 0 refills | Status: DC | PRN

## 2019-02-04 MED ORDER — CARVEDILOL 12.5 MG PO TABS: 12.5000 mg | ORAL_TABLET | Freq: Two times a day (BID) | ORAL | 2 refills | Status: DC

## 2019-02-04 MED ORDER — SPIRONOLACTONE 25 MG PO TABS: 12.5000 mg | ORAL_TABLET | Freq: Every day | ORAL | 2 refills | Status: DC

## 2019-02-04 MED ORDER — CALCIUM CARBONATE ANTACID 500 MG PO CHEW: 1.0000 | CHEWABLE_TABLET | ORAL | Status: DC | PRN

## 2019-02-04 NOTE — Progress Notes (Signed)
Physical Therapy Treatment Patient Details Name: Erica Young MRN: [id number] DOB: 03-09-1941 Today's Date: 02/04/2019    History of Present Illness Pt is a 78 y/o female admitted secondary to L sided weakness and facial droop. 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 PMH including but not limited to metastatic breast cancer, anxiety, patent foramen ovale, hypertension, hyperlipidemia, chronic headaches.    PT Comments    PTA entered room and OT at sink finishing up ADL training.  Per OT she had been shaky on her feet during session.  PTA proceeded with gt training.  Pt performed. 30 ft trial before L knee buckled and patient required mod+2 to catch and return patient to balanced position.  3rd person able to bring chair and BP assessed as she reports feeling mildly dizzy during gt training.  Based on regression and need for increased assistance will update recommendations from HHPT to SNF.  Pt very tearful today.  Will inform supervising PT of need for change in recommendations.     Follow Up Recommendations  Supervision/Assistance - 24 hour;SNF(if she refuses SNF placement she will require HHPT.)     Equipment Recommendations  None recommended by PT    Recommendations for Other Services       Precautions / Restrictions Precautions Precautions: Fall Restrictions Weight Bearing Restrictions: No    Mobility  Bed Mobility Overal bed mobility: Modified Independent             General bed mobility comments: Pt standing with elbows on sink finishing ADLs with OT.  Transfers Overall transfer level: Needs assistance Equipment used: 2 person hand held assist Transfers: Sit to/from Stand Sit to Stand: Mod assist;+2 physical assistance         General transfer comment: Pt had to sit in hallway after B buckling.  From chair in hall way she transferred to recliner with mod +2 to move him from one  chair to the other.  Ambulation/Gait Ambulation/Gait assistance: Min assist;Mod assist;+2 physical assistance Gait Distance (Feet): 40 Feet Assistive device: 2 person hand held assist Gait Pattern/deviations: Decreased stride length;Step-through pattern;Narrow base of support     General Gait Details: Pt unable to perform gt without B hand held assistance.  She presents with increased weakness in B LEs and buckled hevaily requiring mod +2 to correct.  She is concerning for potential fall and she refused to use RW.   Stairs             Wheelchair Mobility    Modified Rankin (Stroke Patients Only)       Balance Overall balance assessment: Needs assistance Sitting-balance support: Feet supported;No upper extremity supported Sitting balance-Leahy Scale: Fair Sitting balance - Comments: Pt feeling weak, had difficulty maintaining core stregnth to stay upright Postural control: Posterior lean Standing balance support: Single extremity supported;During functional activity Standing balance-Leahy Scale: Poor Standing balance comment: required external assistance to maintain standing.                            Cognition Arousal/Alertness: Awake/alert Behavior During Therapy: Anxious(tearful) Overall Cognitive Status: Within Functional Limits for tasks assessed Area of Impairment: Memory                     Memory: Decreased short-term memory         General Comments: Pt very anxious throughout session. When presented with  walker, pt became visibly upset and put head in hands; verbalized that she wants to be independent, and felt if she had the walker it would mean she "couldn't do anything".       Exercises      General Comments General comments (skin integrity, edema, etc.): per RN, pt experienced episode of chest pain and L arm numbness during the night. Pt reported feeling more tired today, and very weak. VSS throughout session      Pertinent  Vitals/Pain Pain Assessment: Faces Faces Pain Scale: Hurts little more Pain Location: legs Pain Descriptors / Indicators: Discomfort;Sore Pain Intervention(s): Monitored during session;Repositioned    Home Living                      Prior Function            PT Goals (current goals can now be found in the care plan section) Acute Rehab PT Goals Patient Stated Goal: to be independent Potential to Achieve Goals: Good Progress towards PT goals: Not progressing toward goals - comment(she is requiring significant increase in assistance, today.)    Frequency    Min 3X/week      PT Plan Current plan remains appropriate    Co-evaluation              AM-PAC PT "6 Clicks" Mobility   Outcome Measure  Help needed turning from your back to your side while in a flat bed without using bedrails?: None Help needed moving from lying on your back to sitting on the side of a flat bed without using bedrails?: A Little Help needed moving to and from a bed to a chair (including a wheelchair)?: A Little Help needed standing up from a chair using your arms (e.g., wheelchair or bedside chair)?: A Little Help needed to walk in hospital room?: A Lot Help needed climbing 3-5 steps with a railing? : A Lot 6 Click Score: 17    End of Session Equipment Utilized During Treatment: Gait belt Activity Tolerance: Patient tolerated treatment well Patient left: with call bell/phone within reach;Other (comment) Nurse Communication: Mobility status PT Visit Diagnosis: Other abnormalities of gait and mobility (R26.89);Other symptoms and signs involving the nervous system (R29.898)     Time: 4917-9150 PT Time Calculation (min) (ACUTE ONLY): 10 min  Charges:  $Gait Training: 8-22 mins                     Governor Rooks, PTA Acute Rehabilitation Services Pager [phone] Office [phone]     Everardo Voris Eli Hose 02/04/2019, 12:01 PM

## 2019-02-04 NOTE — TOC Transition Note (Signed)
Transition of Care [facility]) - CM/SW Discharge Note   Patient Details  Name: Erica Young MRN: [id number] Date of Birth: 24-Nov-1940  Transition of [facility]) CM/SW Contact:  Pollie Friar, RN Phone Number: 02/04/2019, 4:13 PM   Clinical Narrative:    Pt is discharging to [facility] today. She will transport via [place]. Bedside RN updated and d/c packet at the desk. Son aware of d/c and will come see pt prior to leaving.  Room: 801 Number for report: [phone]   Final next level of care: Skilled Nursing Facility Barriers to Discharge: No Barriers Identified   Patient Goals and CMS Choice Patient states their goals for this hospitalization and ongoing recovery are:: to go home CMS Medicare.gov Compare Post Acute Care list provided to:: Patient Represenative (must comment) Choice offered to / list presented to : Adult Children  Discharge Placement              Patient chooses bed at: [facility] Patient to be transferred to facility by: [place] Name of family member notified: Richard Patient and family notified of of transfer: 02/04/19  Discharge Plan and Services   Discharge Planning Services: CM Consult Post Acute Care Choice: Home Health                    HH Arranged: RN, PT, OT, Nurse's Aide [facility] Agency: [place] Date [facility] Agency Contacted: 02/01/19 Time [place]: 9563 Representative spoke with at [place]: [place] ([place]) Interventions     Readmission Risk Interventions No flowsheet data found.

## 2019-02-04 NOTE — Progress Notes (Signed)
Progress Note  Patient Name: Erica Young Date of Encounter: 02/04/2019  Primary Cardiologist: Pixie Casino, MD   Subjective   Patient is sitting up in the chair.  She is mildly anxious.  She says that she is feeling weak, arms are heavy, no energy.  She currently has no chest pain or shortness of breath but at around 3:00 this morning while lab was drawing blood she developed left-sided chest pressure that went down to her left arm.  This lasted a few minutes and resolved spontaneously.  Pt did not tell staff at the time so no EKG or troponin was assessed. She has had no further chest pain.  Inpatient Medications    Scheduled Meds: . ALPRAZolam  0.25 mg Oral TID  . amoxicillin  1,000 mg Oral BID  . aspirin  300 mg Rectal Daily   Or  . aspirin  325 mg Oral Daily  . carvedilol  6.25 mg Oral BID WC  . enoxaparin (LOVENOX) injection  40 mg Subcutaneous Q24H  . feeding supplement (ENSURE ENLIVE)  237 mL Oral BID BM  . fluticasone  2 spray Each Nare Daily  . loratadine  10 mg Oral Daily  . losartan  12.5 mg Oral Daily  . metoCLOPramide (REGLAN) injection  10 mg Intravenous Once  . metroNIDAZOLE  500 mg Oral BID  . ondansetron  4 mg Oral Once  . prochlorperazine  10 mg Intravenous Once  . sodium chloride flush  3 mL Intravenous Once  . spironolactone  12.5 mg Oral Daily   Continuous Infusions:  PRN Meds: acetaminophen **OR** acetaminophen (TYLENOL) oral liquid 160 mg/5 mL **OR** acetaminophen   Vital Signs    Vitals:   02/04/19 0350 02/04/19 0844 02/04/19 0936 02/04/19 1033  BP: 122/69 116/62 (!) 142/76   Pulse: 87 97 88   Resp: 18 16 (!) 28 (!) 21  Temp: 97.9 F (36.6 C) 97.6 F (36.4 C)    TempSrc: Oral Oral    SpO2: 98% 90% 96%   Weight: 61.3 kg     Height:       No intake or output data in the 24 hours ending 02/04/19 1049 Last 3 Weights 02/04/2019 02/03/2019 02/01/2019  Weight (lbs) 135 lb 2.3 oz 139 lb 8.8 oz 135 lb 5.8 oz  Weight (kg) 61.3 kg 63.3 kg 61.4  kg      Telemetry    Sinus rhythm with rates in the 80s-90s- Personally Reviewed  ECG    No new tracings for review  Physical Exam   GEN: No acute distress.   Neck: No JVD Cardiac: RRR, no murmurs, rubs, or gallops.  Respiratory: Clear to auscultation bilaterally. GI: Soft, nontender, non-distended  MS: No edema; No deformity. Neuro:  Nonfocal  Psych: Normal affect   Labs    High Sensitivity Troponin:  No results for input(s): TROPONINIHS in the last 720 hours.    Chemistry Recent Labs  Lab 01/29/19 0855  01/29/19 1048 02/01/19 0643 02/04/19 0330  NA 139   < > 136 138 137  K 4.6   < > 4.6 4.1 4.2  CL 104   < > 104 105 103  CO2 28  --  24 25 25   GLUCOSE 102*   < > 97 101* 112*  BUN 20   < > 20 12 17   CREATININE 0.77   < > 0.72 0.76 0.82  CALCIUM 9.1  --  8.8* 8.5* 8.7*  PROT 7.2  --  7.1  --   --  ALBUMIN 3.7  --  4.0  --   --   AST 16  --  28  --   --   ALT 13  --  16  --   --   ALKPHOS 82  --  73  --   --   BILITOT 0.3  --  0.6  --   --   GFRNONAA >60  --  >60 >60 >60  GFRAA >60  --  >60 >60 >60  ANIONGAP 7  --  8 8 9    < > = values in this interval not displayed.     Hematology Recent Labs  Lab 02/01/19 0643 02/02/19 0636 02/04/19 0330  WBC 2.7* 2.4* 3.0*  RBC 3.25* 3.29* 3.21*  HGB 12.5 13.0 12.6  HCT 35.6* 36.0 35.3*  MCV 109.5* 109.4* 110.0*  MCH 38.5* 39.5* 39.3*  MCHC 35.1 36.1* 35.7  RDW 12.4 12.1 12.1  PLT 290 280 237    BNP Recent Labs  Lab 01/31/19 1154  BNP 13.4     DDimer No results for input(s): DDIMER in the last 168 hours.   Radiology    Ct Chest W Contrast  Result Date: 02/02/2019 CLINICAL DATA:  Metastatic breast cancer.  Shortness of breath. EXAM: CT CHEST WITH CONTRAST TECHNIQUE: Multidetector CT imaging of the chest was performed during intravenous contrast administration. CONTRAST:  59mL OMNIPAQUE IOHEXOL 300 MG/ML  SOLN COMPARISON:  Chest CT 03/17/2018 and 04/24/2017. FINDINGS: Cardiovascular: Mild  atherosclerosis of the aorta and coronary arteries. No evidence of acute pulmonary embolism or other acute vascular findings on non dedicated imaging. The heart size is normal. There is no pericardial effusion. Mediastinum/Nodes: Again demonstrated are multiple mildly enlarged mediastinal, right hilar and right internal mammary lymph nodes. These appear similar to most recent prior study, and include a 16 mm AP window node on image 50/3 and a subcarinal node measuring 15 mm on image 63/3. No progressive adenopathy identified. There is no axillary adenopathy post axillary node dissection. The thyroid gland, trachea and esophagus demonstrate no significant findings. Lungs/Pleura: There is no pleural effusion or pneumothorax. There is stable mild central airway thickening and biapical scarring. The previously demonstrated clustered nodularity posteriorly in the right upper lobe has improved, likely resolving inflammation or scarring. Other nodules are stable, including a 6 mm right lower lobe nodule (image 81/4) and a subpleural nodule in the lingula measuring 12 x 11 mm on image 75/4. No new or enlarging nodules identified. Upper abdomen: No acute findings are seen within the visualized upper abdomen. There is a possible new low-density lesion in the dome of the right hepatic lobe measuring 8 mm on image 84/3. 2.4 cm right adrenal nodule on image 130/3 has not significantly changed. Musculoskeletal/Chest wall: Stable sclerosis within the T8 vertebral body. No new osseous lesions or pathologic fractures. No evidence of chest wall mass. IMPRESSION: 1. Mediastinal, right hilar and right internal mammary adenopathy has not significantly changed, present on multiple prior studies. 2. Scattered pulmonary nodularity is stable as well. 3. Stable right adrenal metastasis. Possible new lesion medially in the dome of the right hepatic lobe, potentially a metastasis. 4. Stable osseous metastatic disease. 5. No acute chest  findings. Electronically Signed   By: Richardean Sale M.D.   On: 02/02/2019 20:37    Cardiac Studies   ECHO:  01/30/2019 1. Left ventricular ejection fraction, by visual estimation, is 30 to 35%. The left ventricle has moderate to severely decreased function. There is mildly increased left ventricular hypertrophy.  2. Multiple segmental abnormalities exist. See findings. 3. Left ventricular diastolic Doppler parameters are consistent with impaired relaxation pattern of LV diastolic filling. 4. Global right ventricle has moderately reduced systolic function.The right ventricular size is normal. No increase in right ventricular wall thickness. 5. Left atrial size was normal. 6. Right atrial size was normal. 7. The mitral valve is grossly normal. Trace mitral valve regurgitation. 8. The tricuspid valve is grossly normal. Tricuspid valve regurgitation is trivial. 9. The aortic valve is tricuspid Aortic valve regurgitation was not visualized by color flow Doppler. 10. The pulmonic valve was grossly normal. Pulmonic valve regurgitation is not visualized by color flow Doppler. 11. Normal pulmonary artery systolic pressure. 12. The inferior vena cava is normal in size with <50% respiratory variability, suggesting right atrial pressure of 8 mmHg. 13. The interatrial septum was not well visualized.  Patient Profile     78 y.o. female w/ hx metastatic breast CA, D-CHF, was admitted 10/02 after Faslodex injection. Had HA, dizzy, syncope, ?L facial droop. 5 cm L nasal cavity mass seen on CT. MRI negative for acute stroke.  Cards saw for CHF, w/ EF now 30-35%.   Assessment & Plan    1.  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  She has been started on carvedilol, losartan and spironolactone.  -Patient is tolerating the current medical therapy.  Heart rates continued in the 80s-90s and blood pressure is elevated.  Will increase beta-blocker.  Patient also had  an episode of brief chest discomfort this morning which may be helped by increasing beta-blocker and better blood pressure control. -S/p CT with no significant change in cancer.  No fluid noted. -Pt has risk factors for CAD and had brief chest pain this am. It resolved spontaneously and has not reoccurred. If pt has recurrent chest pain, would obtain EKG, troponin.   2.  Syncope -Likely due to weakness -Patient is planned to go to [facility] -No significant arrhythmias on telemetry.  Continue beta-blocker.  3.  Metastatic breast cancer -Dr. Jana Hakim is following.  Apparently not yet a hospice candidate with expected survival even without treatment greater than 6 months.      For questions or updates, please contact [place] Please consult [email or web address] for contact info under        Signed, Daune Perch, NP  02/04/2019, 10:49 AM

## 2019-02-04 NOTE — Progress Notes (Signed)
Pt reported during assessment that around 0300 when they came to draw her labs she developed chest pain, and left arm pain that radiated to the left side of her body. She states this resolved spontaneously after a few minutes. She states she did not have any shortness of breath at this time, and she did not have any other pain. She states she did not tell the nurse about it at that time because she was going to try to go back to sleep. When asked if she was experiencing any anxiety she states she is nervous about going to rehab, and learning a new place. Dr Loleta Books and NP notified. Pt working with therapy at this time.

## 2019-02-04 NOTE — Progress Notes (Signed)
Brief Oncology Note:  I stopped to visit with Erica Young today. Patient planning to discharge to SNF hopefully later today for rehabilitation.  I made the patient aware that we were able to get our social worker involved at the cancer center to assist in making sure that her husband was making it to his appointments at our office. She was happy to hear this.  I advised her to keep her follow-up with Dr. Jana Hakim on 02/26/2019 as scheduled. She will continue to hold her [facility] for now. Will re-evaluate at her next appointment with [place].   Mikey Bussing, DNP, AGPCNP-BC, AOCNP

## 2019-02-04 NOTE — Progress Notes (Signed)
Occupational Therapy Treatment Patient Details Name: Erica Young MRN: [id number] DOB: 02-01-1941 Today's Date: 02/04/2019    History of present illness Pt is a 78 y/o female admitted secondary to L sided weakness and facial droop. 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 PMH including but not limited to metastatic breast cancer, anxiety, patent foramen ovale, hypertension, hyperlipidemia, chronic headaches.   OT comments  Pt did not make progress towards OT goals today; pt appeared more weak today, and per RN pt experienced episode of chest pain and L arm numbness during the night. Pt currently presenting with decreased strength, balance, activity tolerance, and increased pain and anxiety. When presented with walker, pt became visibly upset and put head in hands; verbalized that she wants to be independent, and felt if she had the walker it would mean she "couldn't do anything". Pt performed grooming tasks and functional mobility initially with min A for safety and balance. As session progressed and pt fatigued, required mod A +2 for safety and balance. Pt experienced episode where her knees buckled underneath her, required seated rest break. Update dc recommendations to SNF. Will continue to follow acutely as admitted.    Follow Up Recommendations  Supervision/Assistance - 24 hour;SNF    Equipment Recommendations  3 in 1 bedside commode    Recommendations for Other Services PT consult    Precautions / Restrictions Precautions Precautions: Fall Restrictions Weight Bearing Restrictions: No       Mobility Bed Mobility Overal bed mobility: Modified Independent             General bed mobility comments: Increased time and effort with heavy reliance on railing.  Transfers Overall transfer level: Needs assistance Equipment used: None;1 person hand held assist Transfers: Sit to/from Stand Sit to  Stand: Min assist         General transfer comment: required min A to power up to stand    Balance Overall balance assessment: Needs assistance Sitting-balance support: Feet supported;No upper extremity supported Sitting balance-Leahy Scale: Fair Sitting balance - Comments: Pt feeling weak, had difficulty maintaining core stregnth to stay upright Postural control: Posterior lean Standing balance support: Single extremity supported;During functional activity Standing balance-Leahy Scale: Poor Standing balance comment: required support on sink and min A to maintain balance during grooming task                           ADL either performed or assessed with clinical judgement   ADL Overall ADL's : Needs assistance/impaired     Grooming: Oral care;Wash/dry face;Minimal assistance;Standing Grooming Details (indicate cue type and reason): pt performed oral care and washed face standing at sink with min A for maintaining balance. Pt experienced moments when knees would slightly buckle, but was able to maintain standing balance with support. Pt verbalized feeling weak throughout.                  Toilet Transfer: Ambulation;Moderate assistance;+2 for safety/equipment;Minimal assistance;+2 for physical assistance(simulated to recliner.) Toilet Transfer Details (indicate cue type and reason): simulated to recliner. Initially was min A for sit<>stand, however as she fatigued she required mod A +2 for safety and balance.         Functional mobility during ADLs: Minimal assistance;Moderate assistance;+2 for physical assistance;+2 for safety/equipment General ADL Comments: Pt initially performed functional mobility and grooming tasks with min A for balance and safety. As  pt fatigued, required mod A +2 for safety and balance, and experienced a few episodes of knees buckling under her, requiring mod A +2 to regain balance     Vision       Perception     Praxis       Cognition Arousal/Alertness: Awake/alert Behavior During Therapy: Anxious Overall Cognitive Status: Within Functional Limits for tasks assessed Area of Impairment: Memory                     Memory: Decreased short-term memory         General Comments: Pt very anxious throughout session, and became upset when presented with walker. Pt put head in her hands, but had dificulty verbalizing how it made her feel.        Exercises     Shoulder Instructions       General Comments per RN, pt experienced episode of chest pain and L arm numbness during the night. Pt reported feeling more tired today, and very weak. VSS throughout session    Pertinent Vitals/ Pain       Pain Assessment: Faces Faces Pain Scale: Hurts little more Pain Location: legs Pain Descriptors / Indicators: Discomfort;Sore Pain Intervention(s): Monitored during session;Repositioned;RN gave pain meds during session  Home Living                                          Prior Functioning/Environment              Frequency  Min 2X/week        Progress Toward Goals  OT Goals(current goals can now be found in the care plan section)  Progress towards OT goals: Not progressing toward goals - comment(pt appears more weak today)  Acute Rehab OT Goals Patient Stated Goal: to be independent OT Goal Formulation: With patient Time For Goal Achievement: 02/14/19 Potential to Achieve Goals: Good ADL Goals Pt Will Perform Lower Body Bathing: with modified independence;sit to/from stand;sitting/lateral leans Pt Will Perform Lower Body Dressing: with modified independence;sitting/lateral leans;sit to/from stand Pt Will Transfer to Toilet: with modified independence;ambulating;regular height toilet Pt Will Perform Tub/Shower Transfer: with modified independence;shower seat;ambulating Additional ADL Goal #1: Pt will recall and/or apply 1-3 ECS strategies to BADL activity Additional ADL  Goal #2: Pt will complete multistep cognitive task with no errors  Plan Discharge plan remains appropriate;Frequency remains appropriate    Co-evaluation                 AM-PAC OT "6 Clicks" Daily Activity     Outcome Measure   Help from another person eating meals?: None Help from another person taking care of personal grooming?: A Little Help from another person toileting, which includes using toliet, bedpan, or urinal?: A Little Help from another person bathing (including washing, rinsing, drying)?: A Little Help from another person to put on and taking off regular upper body clothing?: None Help from another person to put on and taking off regular lower body clothing?: A Little 6 Click Score: 20    End of Session Equipment Utilized During Treatment: Gait belt  OT Visit Diagnosis: Unsteadiness on feet (R26.81);Other abnormalities of gait and mobility (R26.89);Muscle weakness (generalized) (M62.81);Pain Pain - part of body: Leg(both legs)   Activity Tolerance Patient limited by fatigue;Patient tolerated treatment well;Other (comment)(Complained of weakness and feeling tired throughout session)   Patient Left in  chair;with call bell/phone within reach;with chair alarm set   Nurse Communication Mobility status        Time: [phone] OT Time Calculation (min): 31 min  Charges: OT General Charges $OT Visit: 1 Visit OT Treatments $Self Care/Home Management : 8-22 mins  Gus Rankin, OT Student  Gus Rankin 02/04/2019, 11:31 AM

## 2019-02-04 NOTE — Discharge Summary (Signed)
aPhysician Discharge Summary  Erica Young RFF:[id number] DOB: [birth year] DOA: 01/29/2019  PCP: Isaac Bliss, Rayford Halsted, MD  Admit date: 01/29/2019 Discharge date: 02/04/2019  Admitted From: Home  Disposition:  SNF   Recommendations for Outpatient Follow-up:  1. Follow up with Dentist in 1-2 weeks 2. [facility]: Please continue antibiotics until tooth extraction has been completed 3. Please follow up with Oncology on 10/29 4. Follow up with Cardiology in 3 months     Home Health: TBD at SNF  Equipment/Devices: TBD at SNF  Discharge Condition: Fair  CODE STATUS: FULL Diet recommendation: Regular  Brief/Interim Summary: Erica Young is a 78 y.o. F with metastatic BrCa who presented from [place] clinic after syncopal episode.    Was receiving Faslodex infusion when she syncopized.  She woke promptly but nursing staff felt she had slurred speech, facial droop and left sided weakness.  In the ER, CODE STROKE called.  CT head unremarkable.  Neurology consulted. 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         PRINCIPAL HOSPITAL DIAGNOSIS:  Syncope and generalized weakness from progressively metastatic breast cancer        Discharge Diagnoses:   Progressively metastatic breast cancer on faslodex and palbociclib Stage IV breast cancer, extensive bony metastasis, pulmonary nodules, possible liver metastases and right adrenal mass -Hold Ibrance for now -SNF for acute rehab -Follow up with Oncology 10/29   Failure to thrive Severe protein calorie malnutrition Syncope Patient initially presented with syncope, ?left sided weakness.  MRI brain negative for stroke, but did show progressive metastases in the nasal cavity.  Oncology consulted. Recommended holding Ibrance during rehab, follow up afterwards.   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 -Oncology recommends ENT biopsy as outpatient  New onset acute  systolic and diastolic CHF No prior CHF.  Echo 10/2 showed EF 30-35%. Cardiology consulted. Started on BB, spironolactone and Losartan.   -Follow up with Cardiology in 3 months  Anxiety  Recent dental abscess Recently treated with Augmentin.  On same side as ethmoid mass.  Needs tooth extraction.  -Outpatient Dentist follow up -Continue Augmentin until able to see dentist  Recurrent chest pain  Leukopenia  From chemotherapy. Stable.               Discharge Instructions  Discharge Instructions    Diet - low sodium heart healthy   Complete by: As directed    Discharge instructions   Complete by: As directed    From Dr. Loleta Books: You were admitted with passing out spell.   Ultimately, we were able to tell that this was not from a stroke or a seizure. We suspect this was a stress-related episode, understandable given the progression of your cancer and your chemo.  Thankfully, we are able to arrange rehab, and we are confident you will get stronger if you take a break from [place], work with therapy and get good nutrition.  Also, the heart specialists have recommended adjustments to your heart medicines: STOP diltiazem START the new medicines Coreg, spironolactone and Losartan  Follow up with Dr. Jana Hakim later this month   Follow up with the heart specialists in 3 months (see info below in the To Do section)  Lastly, take the antibiotic Augmentin until your tooth can be removed. Call a dentist to arrange this as soon as possible   Increase activity slowly   Complete by: As directed      Allergies as of  02/04/2019      Reactions   Ciprofloxacin Anaphylaxis      Medication List    STOP taking these medications   amoxicillin 875 MG tablet Commonly known as: AMOXIL   diltiazem 120 MG 24 hr capsule Commonly known as: Cartia XT   fluticasone 50 MCG/ACT nasal spray Commonly known as: FLONASE   FULVESTRANT IM   metroNIDAZOLE 500 MG tablet Commonly known  as: FLAGYL   palbociclib 75 MG tablet Commonly known as: Ibrance     TAKE these medications   acetaminophen 325 MG tablet Commonly known as: TYLENOL Take 650 mg by mouth daily as needed for headache (toothpain).   ALPRAZolam 0.25 MG tablet Commonly known as: XANAX Take 1 tablet (0.25 mg total) by mouth 3 (three) times daily as needed for anxiety. What changed: See the new instructions.   amoxicillin-clavulanate 875-125 MG tablet Commonly known as: Augmentin Take 1 tablet by mouth 2 (two) times daily for 10 days.   aspirin EC 81 MG tablet Take 81 mg by mouth daily after breakfast.   carvedilol 12.5 MG tablet Commonly known as: COREG Take 1 tablet (12.5 mg total) by mouth 2 (two) times daily with a meal.   docusate sodium 100 MG capsule Commonly known as: COLACE Take 100 mg by mouth daily as needed for mild constipation.   Glucosamine 500 MG Caps Take 500 mg by mouth daily.   loratadine 10 MG tablet Commonly known as: CLARITIN Take 1 tablet (10 mg total) by mouth daily.   losartan 25 MG tablet Commonly known as: COZAAR Take 0.5 tablets (12.5 mg total) by mouth daily. Start taking on: February 05, 2019   OVER THE COUNTER MEDICATION [place] for gas, takes twice daily   PROBIOTIC DAILY PO Take 1 tablet by mouth daily.   spironolactone 25 MG tablet Commonly known as: ALDACTONE Take 0.5 tablets (12.5 mg total) by mouth daily. Start taking on: February 05, 2019   VITAMIN D PO Take 1,000 Units by mouth 2 (two) times daily.            Durable Medical Equipment  (From admission, onward)         Start     Ordered   02/02/19 0835  For home use only DME lightweight manual wheelchair with seat cushion  Once    Comments: Patient suffers from severe weakness which impairs their ability to perform daily activities like walking in the home.  A walking aid will not resolve issue with performing activities of daily living. A wheelchair will allow patient to safely perform  daily activities. Patient is not able to propel themselves in the home using a standard weight wheelchair due to severe weakness. Patient can self propel in the lightweight wheelchair. Length of need 6 months. Accessories: elevating leg rests, wheel locks, extensions and anti-tippers.   02/02/19 0835          Contact information for follow-up providers    [facility] Follow up.   Specialty: Home Health Services Contact information: [address] [place] [address] [phone]        Isaac Bliss, Rayford Halsted, MD Follow up.   Specialty: Internal Medicine Why: Call for an appointment 1 week after getting out of  rehab Contact information: [place] [address] [phone]        Pixie Casino, MD Follow up.   Specialty: Cardiology Why: Call the Cardiology office for a follow up with Dr. Debara Pickett in  3 months Contact information: [place] [address] [phone]            Contact information for after-discharge care    Destination    [facility] .   Service: Skilled Nursing Contact information: [address] [place] [phone]                 Allergies  Allergen Reactions  . Ciprofloxacin Anaphylaxis    Consultations:  Neurology  Oncology   Procedures/Studies: Ct Angio Head W/cm &/or Wo Cm  Result Date: 01/29/2019 CLINICAL DATA:  Left-sided weakness, slurred speech, headache. EXAM: CT ANGIOGRAPHY HEAD AND NECK TECHNIQUE: Multidetector CT imaging of the head and neck was performed using the standard protocol during bolus administration of intravenous contrast. Multiplanar CT image reconstructions and MIPs were obtained to evaluate the vascular anatomy. Carotid stenosis measurements (when applicable) are obtained utilizing NASCET criteria, using the distal internal carotid diameter as the denominator. CONTRAST:   181m OMNIPAQUE IOHEXOL 350 MG/ML SOLN COMPARISON:  Noncontrast head CT 11/29/2018, brain MRI 11/11/2014, MRA head neck 12/19/2009, nuclear medicine bone scan 03/17/2018 FINDINGS: CTA NECK FINDINGS Aortic arch: Common origin of the innominate and left common carotid arteries. Included portions of the aortic arch demonstrate no evidence of dissection or aneurysm. Mild atherosclerotic calcification of the visualized aortic arch. Right carotid system: CCA and ICA widely patent within the neck without significant stenosis. Left carotid system: CCA and ICA widely patent within the neck without significant stenosis. Vertebral arteries: The right vertebral artery is slightly dominant. Streak artifact from a dense left-sided contrast bolus limits evaluation of the V1 and proximal V2 left vertebral artery. Within this limitation, the bilateral vertebral arteries are patent within the neck without significant stenosis (50% or greater). Skeleton: Mild cervical spondylosis. Known right parietal calvarial metastasis. Please refer to previous nuclear medicine bone scan 03/17/2018 for description of additional osseous metastases. Other neck: No mass or enlarged lymph nodes. The thyroid gland is mildly heterogeneous but otherwise unremarkable. Upper chest: No consolidation within the imaged lung apices. Review of the MIP images confirms the above findings CTA HEAD FINDINGS Anterior circulation: Mild scattered soft and calcified plaque within the intracranial internal carotid arteries. The intracranial internal carotid arteries are patent bilaterally without significant stenosis. Right middle and anterior cerebral arteries patent without high-grade proximal stenosis. Left middle and anterior cerebral arteries patent without high-grade proximal stenosis. 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 A tiny 1 mm aneurysm arising from a right  M2 branch vessel is also questioned (series 7, image 230) (series 9, image 63). Posterior circulation: Intracranial vertebral arteries are patent without high-grade stenosis. Basilar artery is patent without high-grade stenosis. Bilateral posterior cerebral arteries patent without high-grade proximal stenosis. Predominantly fetal origin of the right posterior cerebral artery. Venous sinuses: Within limitations of contrast timing, no convincing thrombus. Anatomic variants: None significant Other: Again demonstrated is a mass centered within the region of the left cribriform plate and left ethmoid sinuses. The mass measures approximately 4.1 x 3.0 x 5.0 cm (AP x TV x CC) . There is associated expansile bony remodeling with osseous erosion through the cribriform plate into the intracranial compartment. There is also lateral bowing/erosion of the left lamina papyracea. The mass extends into the left nasal passage and partly into the left maxillary sinus. Associated complete a opacification of obstructed left frontal and  left sphenoid sinuses. There is also extensive partial opacification of bilateral ethmoid air cells. Significant rightward deviation of the bony nasal septum. Previously this mass measured 2.8 x 2.8 x 2.8 cm on MRI 11/11/2014. Review of the MIP images confirms the above findings IMPRESSION: CTA head: 1. No intracranial large vessel occlusion or proximal high-grade arterial stenosis identified. 2. 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 3. Question tiny 1 mm aneurysm arising from a right M2 branch vessel, as described. 4. 4.1 x 3.0 x 5.0 cm mass centered in the region of the left cribriform plate/left ethmoid sinuses with expansile bony remodeling and osseous erosion as described. As before, there is erosion of the cribriform plate with some extension into the intracranial compartment. Although non-specific, this mass may reflect a metastasis and has  increased in size since MRI 11/11/2014. CTA neck: 1. The bilateral common and cervical internal carotid arteries are widely patent without significant stenosis 2. Streak artifact from a dense left-sided contrast bolus limits evaluation of the V1 and proximal V2 left vertebral artery. Within this limitation, the bilateral cervical vertebral arteries are patent without significant stenosis. 3. Mild atherosclerotic disease within the visualized aortic arch. Electronically Signed   By: Kellie Simmering   On: 01/29/2019 12:25   Ct Angio Neck W And/or Wo Contrast  Result Date: 01/29/2019 CLINICAL DATA:  Left-sided weakness, slurred speech, headache. EXAM: CT ANGIOGRAPHY HEAD AND NECK TECHNIQUE: Multidetector CT imaging of the head and neck was performed using the standard protocol during bolus administration of intravenous contrast. Multiplanar CT image reconstructions and MIPs were obtained to evaluate the vascular anatomy. Carotid stenosis measurements (when applicable) are obtained utilizing NASCET criteria, using the distal internal carotid diameter as the denominator. CONTRAST:  163m OMNIPAQUE IOHEXOL 350 MG/ML SOLN COMPARISON:  Noncontrast head CT 11/29/2018, brain MRI 11/11/2014, MRA head neck 12/19/2009, nuclear medicine bone scan 03/17/2018 FINDINGS: CTA NECK FINDINGS Aortic arch: Common origin of the innominate and left common carotid arteries. Included portions of the aortic arch demonstrate no evidence of dissection or aneurysm. Mild atherosclerotic calcification of the visualized aortic arch. Right carotid system: CCA and ICA widely patent within the neck without significant stenosis. Left carotid system: CCA and ICA widely patent within the neck without significant stenosis. Vertebral arteries: The right vertebral artery is slightly dominant. Streak artifact from a dense left-sided contrast bolus limits evaluation of the V1 and proximal V2 left vertebral artery. Within this limitation, the bilateral  vertebral arteries are patent within the neck without significant stenosis (50% or greater). Skeleton: Mild cervical spondylosis. Known right parietal calvarial metastasis. Please refer to previous nuclear medicine bone scan 03/17/2018 for description of additional osseous metastases. Other neck: No mass or enlarged lymph nodes. The thyroid gland is mildly heterogeneous but otherwise unremarkable. Upper chest: No consolidation within the imaged lung apices. Review of the MIP images confirms the above findings CTA HEAD FINDINGS Anterior circulation: Mild scattered soft and calcified plaque within the intracranial internal carotid arteries. The intracranial internal carotid arteries are patent bilaterally without significant stenosis. Right middle and anterior cerebral arteries patent without high-grade proximal stenosis. Left middle and anterior cerebral arteries patent without high-grade proximal stenosis. 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 A tiny 1 mm aneurysm arising from a right M2 branch vessel is also questioned (series 7, image 230) (series 9, image 63). Posterior circulation: Intracranial  vertebral arteries are patent without high-grade stenosis. Basilar artery is patent without high-grade stenosis. Bilateral posterior cerebral arteries patent without high-grade proximal stenosis. Predominantly fetal origin of the right posterior cerebral artery. Venous sinuses: Within limitations of contrast timing, no convincing thrombus. Anatomic variants: None significant Other: Again demonstrated is a mass centered within the region of the left cribriform plate and left ethmoid sinuses. The mass measures approximately 4.1 x 3.0 x 5.0 cm (AP x TV x CC) . There is associated expansile bony remodeling with osseous erosion through the cribriform plate into the intracranial compartment. There is also lateral  bowing/erosion of the left lamina papyracea. The mass extends into the left nasal passage and partly into the left maxillary sinus. Associated complete a opacification of obstructed left frontal and left sphenoid sinuses. There is also extensive partial opacification of bilateral ethmoid air cells. Significant rightward deviation of the bony nasal septum. Previously this mass measured 2.8 x 2.8 x 2.8 cm on MRI 11/11/2014. Review of the MIP images confirms the above findings IMPRESSION: CTA head: 1. No intracranial large vessel occlusion or proximal high-grade arterial stenosis identified. 2. 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 3. Question tiny 1 mm aneurysm arising from a right M2 branch vessel, as described. 4. 4.1 x 3.0 x 5.0 cm mass centered in the region of the left cribriform plate/left ethmoid sinuses with expansile bony remodeling and osseous erosion as described. As before, there is erosion of the cribriform plate with some extension into the intracranial compartment. Although non-specific, this mass may reflect a metastasis and has increased in size since MRI 11/11/2014. CTA neck: 1. The bilateral common and cervical internal carotid arteries are widely patent without significant stenosis 2. Streak artifact from a dense left-sided contrast bolus limits evaluation of the V1 and proximal V2 left vertebral artery. Within this limitation, the bilateral cervical vertebral arteries are patent without significant stenosis. 3. Mild atherosclerotic disease within the visualized aortic arch. Electronically Signed   By: Kellie Simmering   On: 01/29/2019 12:25   Ct Chest W Contrast  Result Date: 02/02/2019 CLINICAL DATA:  Metastatic breast cancer.  Shortness of breath. EXAM: CT CHEST WITH CONTRAST TECHNIQUE: Multidetector CT imaging of the chest was performed during intravenous contrast administration. CONTRAST:  61m OMNIPAQUE IOHEXOL 300 MG/ML  SOLN  COMPARISON:  Chest CT 03/17/2018 and 04/24/2017. FINDINGS: Cardiovascular: Mild atherosclerosis of the aorta and coronary arteries. No evidence of acute pulmonary embolism or other acute vascular findings on non dedicated imaging. The heart size is normal. There is no pericardial effusion. Mediastinum/Nodes: Again demonstrated are multiple mildly enlarged mediastinal, right hilar and right internal mammary lymph nodes. These appear similar to most recent prior study, and include a 16 mm AP window node on image 50/3 and a subcarinal node measuring 15 mm on image 63/3. No progressive adenopathy identified. There is no axillary adenopathy post axillary node dissection. The thyroid gland, trachea and esophagus demonstrate no significant findings. Lungs/Pleura: There is no pleural effusion or pneumothorax. There is stable mild central airway thickening and biapical scarring. The previously demonstrated clustered nodularity posteriorly in the right upper lobe has improved, likely resolving inflammation or scarring. Other nodules are stable, including a 6 mm right lower lobe nodule (image 81/4) and a subpleural nodule in the lingula measuring 12 x 11 mm on image 75/4. No new or enlarging nodules identified. Upper abdomen: No acute findings are seen within the visualized upper abdomen. There is a possible  new low-density lesion in the dome of the right hepatic lobe measuring 8 mm on image 84/3. 2.4 cm right adrenal nodule on image 130/3 has not significantly changed. Musculoskeletal/Chest wall: Stable sclerosis within the T8 vertebral body. No new osseous lesions or pathologic fractures. No evidence of chest wall mass. IMPRESSION: 1. Mediastinal, right hilar and right internal mammary adenopathy has not significantly changed, present on multiple prior studies. 2. Scattered pulmonary nodularity is stable as well. 3. Stable right adrenal metastasis. Possible new lesion medially in the dome of the right hepatic lobe,  potentially a metastasis. 4. Stable osseous metastatic disease. 5. No acute chest findings. Electronically Signed   By: Richardean Sale M.D.   On: 02/02/2019 20:37   Mr Brain Wo Contrast  Result Date: 01/29/2019 CLINICAL DATA:  78 year old female with code stroke presentation. Sudden onset left side weakness at 0955 hours. Slurred speech and headache. 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 EXAM: MRI HEAD WITHOUT CONTRAST TECHNIQUE: Multiplanar, multiecho pulse sequences of the brain and surrounding structures were obtained without intravenous contrast. COMPARISON:  CT head, CTA head and neck earlier today. Brain MRI 11/01/2014. FINDINGS: Brain: No restricted diffusion to suggest acute infarction. No midline shift, mass effect, evidence of mass lesion, ventriculomegaly, extra-axial collection or acute intracranial hemorrhage. Cervicomedullary junction and pituitary are within normal limits. Mild for age scattered subcortical cerebral white matter T2 and FLAIR hyperintensity in both hemispheres (series 6, image 18) has mildly increased since 2016. There could be a subtle area of right superior sensory strip encephalomalacia on image 21 which is chronic. No evidence of vasogenic edema. No other cortical encephalomalacia. No chronic cerebral blood products. The deep gray nuclei and brainstem are negative. The deep gray nuclei, brainstem and cerebellum are negative; probable small crossing right cerebellar vessel rather than tiny chronic lacune on series 8, image 31. Vascular: Major intracranial vascular flow voids are stable since 2016. Skull and upper cervical spine: Regressed right posterior skull metastasis since 2016 on series 6, image 14. heterogeneous bone marrow signal elsewhere with no destructive osseous lesion identified. Negative visible cervical spine. Sinuses/Orbits: Large and chronic but substantially increased heterogeneously T2 hyperintense mass centered in the left nasal cavity  now estimated up to 47 millimeters diameter (29 millimeters in 2016). Subsequent left side paranasal sinus obstruction with diffuse retained secretions and mucosal thickening. Interestingly, the mass demonstrates facilitated diffusion (series 901, image 36). No contrast administered today. Previously the mass was homogeneously enhancing. The right ethmoid air cells are also obstructed and opacified, but the remaining right paranasal sinuses are well pneumatized. The bilateral orbits soft tissues remain normal. Other: Grossly negative visible internal auditory structures. Chronic right mastoid effusion is stable. Left mastoids remain clear. Negative scalp soft tissues. IMPRESSION: 1. Negative for acute infarct, and no significant change in the noncontrast MRI appearance of the brain since 2016. 2. 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 Subsequent paranasal sinus obstruction and left-side sinus mucocele formation. 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 Follow-up with ENT recommended. 3. Regressed right parietal bone metastasis since 2016. Electronically Signed   By: Genevie Ann M.D.   On: 01/29/2019 22:47   Mr Brain W Contrast  Result Date: 01/30/2019 CLINICAL DATA:  Follow-up abnormal brain MRI EXAM: MRI HEAD WITH CONTRAST TECHNIQUE:  Multiplanar, multiecho pulse sequences of the brain and surrounding structures were obtained with intravenous contrast. CONTRAST:  62m GADAVIST GADOBUTROL 1 MMOL/ML IV SOLN COMPARISON:  Brain MRI from yesterday FINDINGS: Brain: No evidence of hematogenous metastasis to the brain. Vascular: Major vessels are enhancing Skull and upper cervical spine: Decreased enhancement and expansion of right parietal bone metastasis seen on comparison. There is only minimal  enhancement today and no dural distortion. Sinuses/Orbits: Large mass centered in the left nasal cavity with cribriform plate extension and dural distortion. Mass measures up to 5 cm craniocaudal and obstructs left-sided sinuses. There is extensive bony erosion at the level of the mass, which has significantly enlarged since 2016. No visible brain invasion. IMPRESSION: 1. 5 cm left nasal cavity mass extending to the cribriform plate and causing postobstructive sinusitis. Given metastatic disease elsewhere has responded in this lesion has enlarged, favor a primary nasal mass rather than metastatic disease. 2. No worrisome findings at the regressed right parietal bone metastasis. 3. No evidence of metastasis to the brain. Electronically Signed   By: JMonte FantasiaM.D.   On: 01/30/2019 08:13   Ct Head Code Stroke Wo Contrast  Result Date: 01/29/2019 CLINICAL DATA:  Code stroke. Cerebral hemorrhage suspected, possible stroke. Additional [facility] provided[facility] 09[facility]55. Slurred speech. EXAM: CT HEAD WITHOUT CONTRAST TECHNIQUE: Contiguous axial images were obtained from the base of the skull through the vertex without intravenous contrast. COMPARISON:  Report from head CT 04/11/2015 (images unavailable), brain MRI [id number] FINDINGS: Brain: No evidence of acute intracranial hemorrhage. No demarcated cortical infarction. No midline shift or extra-axial fluid collection. Mild generalized parenchymal atrophy. Partially empty sella turcica. Vascular: No definite hyperdense vessel. Skull: No calvarial fracture. Irregular lucency within the right parietal bone consistent with known right parietal bone metastasis. Sinuses/Orbits: Incompletely imaged partially calcified mass centered in the region of the left cribriform plate and left ethmoid sinuses. As before, there is erosion through the cribriform plate with intracranial extension. Associated bony expansion and remodeling.  This includes redemonstrated lateral bowing/erosion of the left lamina papyracea. The mass extends partly into the left nasal passage and left maxillary sinus. Associated complete opacification of the left frontal and sphenoid sinuses. The mass measures 3.8 x 3.1 cm (AP x TV) and at least 3.3 cm in craniocaudal dimension (series 2, image 1) (series 6, image 27). On prior brain MRI 11/11/2014, this mass measured 2.8 x 2.8 x 2.8 cm ASPECTS ([facility]) - Ganglionic level infarction (caudate, lentiform nuclei, internal capsule, insula, M1-M3 cortex): 7 - Supraganglionic infarction (M4-M6 cortex): 3 Total score (0-10 with 10 being normal): 10 These results were called by telephone at the time of interpretation on 01/29/2019 at 10:53 am [facility], who verbally acknowledged these results. IMPRESSION: 1. No evidence of intracranial hemorrhage or acute demarcated cortical infarction 2. Incompletely imaged partially calcified mass centered in the region of the left cribriform plate and left ethmoid sinuses. As before, there is erosion through the cribriform plate with intracranial extension. The mass measures 3.8 x 3.1 cm (AP x TV) and at least 3.3 cm in craniocaudal dimension (previously 2.8 x 2.8 x 2.8 cm on MRI 11/11/2014). Complete opacification of obstructed left frontal and left sphenoid sinuses. 3. Known right parietal bone metastasis. Electronically Signed   By: KKellie Simmering  On: 01/29/2019 11:06  Echocardiogram  IMPRESSIONS    1. Left ventricular ejection fraction, by visual estimation, is 30  to 35%. The left ventricle has moderate to severely decreased function. There is mildly increased left ventricular hypertrophy.  2. Multiple segmental abnormalities exist. See findings.  3. Left ventricular diastolic Doppler parameters are consistent with impaired relaxation pattern of LV diastolic filling.  4. Global right ventricle has moderately reduced systolic  function.The right ventricular size is normal. No increase in right ventricular wall thickness.  5. Left atrial size was normal.  6. Right atrial size was normal.  7. The mitral valve is grossly normal. Trace mitral valve regurgitation.  8. The tricuspid valve is grossly normal. Tricuspid valve regurgitation is trivial.  9. The aortic valve is tricuspid Aortic valve regurgitation was not visualized by color flow Doppler. 10. The pulmonic valve was grossly normal. Pulmonic valve regurgitation is not visualized by color flow Doppler. 11. Normal pulmonary artery systolic pressure. 12. The inferior vena cava is normal in size with <50% respiratory variability, suggesting right atrial pressure of 8 mmHg. 13. The interatrial septum was not well visualized.     Subjective: Anxious.  Had sharp left sided chest pain briefly this morning.  Then heartburn after lunch.  No confusion,fever. No respiratory distress, vomiting.  Discharge Exam: Vitals:   02/04/19 1215 02/04/19 1253  BP: 116/73 130/77  Pulse: 78 78  Resp: 16 18  Temp: 97.7 F (36.5 C) 97.7 F (36.5 C)  SpO2: 94% 96%   Vitals:   02/04/19 0936 02/04/19 1033 02/04/19 1215 02/04/19 1253  BP: (!) 142/76  116/73 130/77  Pulse: 88  78 78  Resp: (!) 28 (!) _0 Temp:   97.7 F (36.5 C) 97.7 F (36.5 C)  TempSrc:   Oral Oral  SpO2: 96%  94% 96%  Weight:      Height:        General: Pt is alert, awake, not in acute distress Cardiovascular: RRR, nl S1-S2, no murmurs appreciated.   No LE edema.   Respiratory: Normal respiratory rate and rhythm.  CTAB without rales or wheezes. Abdominal: Abdomen soft and non-tender.  No distension or HSM.   Neuro/Psych: Strength symmetric in upper and lower extremities.  Judgment and insight appear normal.   The results of significant diagnostics from this hospitalization (including imaging, microbiology, ancillary and laboratory) are listed below for reference.     Microbiology: Recent  Results (from the past 240 hour(s))  SARS CORONAVIRUS 2 (TAT 6-24 HRS) Nasopharyngeal Nasopharyngeal Swab     Status: None   Collection Time: 01/29/19  1:44 PM   Specimen: Nasopharyngeal Swab  Result Value Ref Range Status   SARS Coronavirus 2 NEGATIVE NEGATIVE Final    Comment: (NOTE) SARS-CoV-2 target nucleic acids are NOT DETECTED. The SARS-CoV-2 RNA is generally detectable in upper and lower respiratory specimens during the acute phase of infection. Negative results do not preclude SARS-CoV-2 infection, do not rule out co-infections with other pathogens, and should not be used as the sole basis for treatment or other patient management decisions. Negative results must be combined with clinical observations, patient history, and epidemiological information. The expected result is Negative. Fact Sheet for Patients: SugarRoll.be Fact Sheet for Healthcare Providers: https://www.woods-mathews.com/ This test is not yet approved or cleared by the [place] FDA and  has been authorized for detection and/or diagnosis of SARS-CoV-2 by FDA under an Emergency Use Authorization (EUA). This EUA will remain  in effect (meaning this test can be used) for the duration of the COVID-19 declaration under Section 56 4(b)(1) of the Act, 21 U.S.C. section  360bbb-3(b)(1), unless the authorization is terminated or revoked sooner. Performed at [facility], [place] [address], [place], [place] [address]   Novel Coronavirus, NAA (hospital order; send-out to ref lab)     Status: None   Collection Time: 02/03/19  7:00 AM   Specimen: Nasopharyngeal Swab; Respiratory  Result Value Ref Range Status   SARS-CoV-2, NAA NOT DETECTED NOT DETECTED Final    Comment: (NOTE) This nucleic acid amplification test was developed and its performance characteristics determined by [employer]. Nucleic acid amplification tests include PCR and TMA. This test has not  been FDA cleared or approved. This test has been authorized by FDA under an Emergency Use Authorization (EUA). This test is only authorized for the duration of time the declaration that circumstances exist justifying the authorization of the emergency use of in vitro diagnostic tests for detection of SARS-CoV-2 virus and/or diagnosis of COVID-19 infection under section 564(b)(1) of the Act, 21 U.S.C. 758ITG-5(Q) (1), unless the authorization is terminated or revoked sooner. When diagnostic testing is negative, the possibility of a false negative result should be considered in the context of a patient's recent exposures and the presence of clinical signs and symptoms consistent with COVID-19. An individual without symptoms of COVID- 19 and who is not shedding SARS-CoV-2 vi rus would expect to have a negative (not detected) result in this assay. Performed At: [facility] [address] [place], [place] [id number] Rush Farmer MD EN:[id number]    [place]  Final    Comment: Performed at [facility], [place] [address], [place], [place] [address]     Labs: BNP (last 3 results) Recent Labs    01/31/19 1154  BNP 45.8   Basic Metabolic Panel: Recent Labs  Lab 01/29/19 0855 01/29/19 1047 01/29/19 1048 02/01/19 0643 02/04/19 0330  NA 139 138 136 138 137  K 4.6 4.1 4.6 4.1 4.2  CL 104 106 104 105 103  CO2 28  --  _0 GLUCOSE 102* 90 97 101* 112*  BUN _1 CREATININE 0.77 0.60 0.72 0.76 0.82  CALCIUM 9.1  --  8.8* 8.5* 8.7*   Liver Function Tests: Recent Labs  Lab 01/29/19 0855 01/29/19 1048  AST 16 28  ALT 13 16  ALKPHOS 82 73  BILITOT 0.3 0.6  PROT 7.2 7.1  ALBUMIN 3.7 4.0   No results for input(s): LIPASE, AMYLASE in the last 168 hours. No results for input(s): AMMONIA in the last 168 hours. CBC: Recent Labs  Lab 01/29/19 0855 01/29/19 1047 01/29/19 1048 02/01/19 0643 02/02/19 0636 02/04/19 0330  WBC  2.3*  --  2.3* 2.7* 2.4* 3.0*  NEUTROABS 1.3*  --  1.3*  --  1.3*  --   HGB 12.2 11.9* 12.6 12.5 13.0 12.6  HCT 36.0 35.0* 37.8 35.6* 36.0 35.3*  MCV 112.5*  --  112.5* 109.5* 109.4* 110.0*  PLT 294  --  296 290 280 237   Cardiac Enzymes: No results for input(s): CKTOTAL, CKMB, CKMBINDEX, TROPONINI in the last 168 hours. BNP: Invalid input(s): POCBNP CBG: Recent Labs  Lab 01/29/19 1037 01/31/19 1207  GLUCAP 96 117*   D-Dimer No results for input(s): DDIMER in the last 72 hours. Hgb A1c No results for input(s): HGBA1C in the last 72 hours. Lipid Profile No results for input(s): CHOL, HDL, LDLCALC, TRIG, CHOLHDL, LDLDIRECT in the last 72 hours. Thyroid function studies No results for input(s): TSH, T4TOTAL, T3FREE, THYROIDAB in the  last 72 hours.  Invalid input(s): FREET3 Anemia work up No results for input(s): VITAMINB12, FOLATE, FERRITIN, TIBC, IRON, RETICCTPCT in the last 72 hours. Urinalysis    Component Value Date/Time   COLORURINE STRAW (A) 11/19/2016 1750   APPEARANCEUR CLEAR 11/19/2016 1750   LABSPEC 1.003 (L) 11/19/2016 1750   PHURINE 7.0 11/19/2016 1750   GLUCOSEU NEGATIVE 11/19/2016 1750   HGBUR NEGATIVE 11/19/2016 1750   BILIRUBINUR NEGATIVE 11/19/2016 1750   BILIRUBINUR neg 02/16/2015 1318   KETONESUR NEGATIVE 11/19/2016 1750   PROTEINUR NEGATIVE 11/19/2016 1750   UROBILINOGEN 0.2 02/16/2015 1318   UROBILINOGEN 0.2 11/18/2013 1321   NITRITE NEGATIVE 11/19/2016 1750   LEUKOCYTESUR TRACE (A) 11/19/2016 1750   Sepsis Labs Invalid input(s): PROCALCITONIN,  WBC,  LACTICIDVEN Microbiology Recent Results (from the past 240 hour(s))  SARS CORONAVIRUS 2 (TAT 6-24 HRS) Nasopharyngeal Nasopharyngeal Swab     Status: None   Collection Time: 01/29/19  1:44 PM   Specimen: Nasopharyngeal Swab  Result Value Ref Range Status   SARS Coronavirus 2 NEGATIVE NEGATIVE Final    Comment: (NOTE) SARS-CoV-2 target nucleic acids are NOT DETECTED. The SARS-CoV-2 RNA is  generally detectable in upper and lower respiratory specimens during the acute phase of infection. Negative results do not preclude SARS-CoV-2 infection, do not rule out co-infections with other pathogens, and should not be used as the sole basis for treatment or other patient management decisions. Negative results must be combined with clinical observations, patient history, and epidemiological information. The expected result is Negative. Fact Sheet for Patients: SugarRoll.be Fact Sheet for Healthcare Providers: https://www.woods-mathews.com/ This test is not yet approved or cleared by the [place] FDA and  has been authorized for detection and/or diagnosis of SARS-CoV-2 by FDA under an Emergency Use Authorization (EUA). This EUA will remain  in effect (meaning this test can be used) for the duration of the COVID-19 declaration under Section 56 4(b)(1) of the Act, 21 U.S.C. section 360bbb-3(b)(1), unless the authorization is terminated or revoked sooner. Performed at [facility], [place] [address], [place], [place] [address]   Novel Coronavirus, NAA (hospital order; send-out to ref lab)     Status: None   Collection Time: 02/03/19  7:00 AM   Specimen: Nasopharyngeal Swab; Respiratory  Result Value Ref Range Status   SARS-CoV-2, NAA NOT DETECTED NOT DETECTED Final    Comment: (NOTE) This nucleic acid amplification test was developed and its performance characteristics determined by [employer] and [employer]. Nucleic acid amplification tests include PCR and TMA. This test has not been FDA cleared or approved. This test has been authorized by FDA under an Emergency Use Authorization (EUA). This test is only authorized for the duration of time the declaration that circumstances exist justifying the authorization of the emergency use of in vitro diagnostic tests for detection of SARS-CoV-2 virus and/or diagnosis of COVID-19 infection under  section 564(b)(1) of the Act, 21 U.S.C. 469GEX-5(M) (1), unless the authorization is terminated or revoked sooner. When diagnostic testing is negative, the possibility of a false negative result should be considered in the context of a patient's recent exposures and the presence of clinical signs and symptoms consistent with COVID-19. An individual without symptoms of COVID- 19 and who is not shedding SARS-CoV-2 vi rus would expect to have a negative (not detected) result in this assay. Performed At: [facility] [address] [place], [place] [id number] Rush Farmer MD UU:[id number]    [place]  Final    Comment: Performed at [facility], [place] [address] [place], [place] [address]     Time coordinating discharge: 25 minutes      SIGNED:   Edwin Dada, MD  [facility] 02/04/2019, 3:26 PM

## 2019-02-05 DIAGNOSIS — D72819 Decreased white blood cell count, unspecified: Secondary | ICD-10-CM | POA: Diagnosis not present

## 2019-02-05 DIAGNOSIS — K047 Periapical abscess without sinus: Secondary | ICD-10-CM | POA: Diagnosis not present

## 2019-02-05 DIAGNOSIS — R531 Weakness: Secondary | ICD-10-CM | POA: Diagnosis not present

## 2019-02-05 DIAGNOSIS — R55 Syncope and collapse: Secondary | ICD-10-CM | POA: Diagnosis not present

## 2019-02-13 ENCOUNTER — Telehealth: Payer: Self-pay | Admitting: *Deleted

## 2019-02-13 ENCOUNTER — Telehealth: Payer: Self-pay | Admitting: Internal Medicine

## 2019-02-13 DIAGNOSIS — R531 Weakness: Secondary | ICD-10-CM | POA: Diagnosis not present

## 2019-02-13 DIAGNOSIS — I5041 Acute combined systolic (congestive) and diastolic (congestive) heart failure: Secondary | ICD-10-CM | POA: Diagnosis not present

## 2019-02-13 DIAGNOSIS — R55 Syncope and collapse: Secondary | ICD-10-CM | POA: Diagnosis not present

## 2019-02-13 DIAGNOSIS — D72819 Decreased white blood cell count, unspecified: Secondary | ICD-10-CM | POA: Diagnosis not present

## 2019-02-13 NOTE — Telephone Encounter (Signed)
noted 

## 2019-02-13 NOTE — Telephone Encounter (Signed)
Pt is calling and would like dr Jerilee Hoh to know she had cancer treatment at [facility] about 2 wk ago. Pt fainted during treatment and was transfer to  . Pt is at [facility] for physical therapy for walking. Pt would like dr to call her

## 2019-02-13 NOTE — Telephone Encounter (Signed)
This RN returned call to pt left this afternoon stating " I need you to call me - it is very urgent ".  Noted pt is at SNF for rehab- and reached pt per cell number.  Erica Young stated she was upset because was doing well at [facility] " until they moved someone else into my room and she is very needy and awake during the night and I am not getting any sleep "  " I told them I needed to be moved but they said they didn't have another room for me - but I am not getting any sleep and I need my sleep"  Pt is presently being moved to another room.  Erica Young was appreciative of call and no further needs at this time.

## 2019-02-17 ENCOUNTER — Telehealth: Payer: Self-pay | Admitting: *Deleted

## 2019-02-17 DIAGNOSIS — R531 Weakness: Secondary | ICD-10-CM | POA: Diagnosis not present

## 2019-02-17 DIAGNOSIS — F419 Anxiety disorder, unspecified: Secondary | ICD-10-CM | POA: Diagnosis not present

## 2019-02-17 NOTE — Telephone Encounter (Signed)
Received call from radiology questioning if pt needs to have Chest CT preformed tomorrow (order placed in 12/04/2018) considering pt had one done on 02/02/2019 when she went to the ED.  No need for pt to have repeat Chest CT considering one was just done.  Apt canceled.  RN attempt x1 to call pt.  No answer, LVM explaining CT apt being canceled and reason why.

## 2019-02-18 ENCOUNTER — Ambulatory Visit (HOSPITAL_COMMUNITY): Admission: RE | Admit: 2019-02-18 | Payer: Medicare Other | Source: Ambulatory Visit

## 2019-02-19 ENCOUNTER — Telehealth: Payer: Self-pay

## 2019-02-19 ENCOUNTER — Other Ambulatory Visit: Payer: Self-pay | Admitting: Oncology

## 2019-02-19 NOTE — Progress Notes (Unsigned)
Aranas is currently at [facility] for rehab.  Talk to her today.  She tells me she is making a big effort to get stronger.  She will return for her next treatment next week.

## 2019-02-19 NOTE — Telephone Encounter (Signed)
[place] to inform of pt's upcoming appointments and the need for them to arrange transportation.  LVM detailing appt date and time and request return call.

## 2019-02-24 ENCOUNTER — Other Ambulatory Visit: Payer: Self-pay | Admitting: *Deleted

## 2019-02-24 DIAGNOSIS — I5041 Acute combined systolic (congestive) and diastolic (congestive) heart failure: Secondary | ICD-10-CM | POA: Diagnosis not present

## 2019-02-24 DIAGNOSIS — R531 Weakness: Secondary | ICD-10-CM | POA: Diagnosis not present

## 2019-02-24 DIAGNOSIS — W19XXXA Unspecified fall, initial encounter: Secondary | ICD-10-CM | POA: Diagnosis not present

## 2019-02-24 DIAGNOSIS — F419 Anxiety disorder, unspecified: Secondary | ICD-10-CM | POA: Diagnosis not present

## 2019-02-24 NOTE — Patient Outreach (Signed)
Member assessed for potential [facility] Care Management needs as a benefit of [place] Medicare.  Member resides at [facility] for rehab therapy.   Outreach attempt made to facility discharge planner to inquire about disposition plans.  Will plan outreach to discuss [place] Management program services.  Marthenia Rolling, MSN-Ed, RN,BSN [place] Acute Care Coordinator [phone] [facility]) [phone]  (Toll free office)

## 2019-02-26 ENCOUNTER — Encounter: Payer: Self-pay | Admitting: Adult Health

## 2019-02-26 ENCOUNTER — Inpatient Hospital Stay: Payer: No Typology Code available for payment source

## 2019-02-26 ENCOUNTER — Ambulatory Visit: Payer: Medicare Other | Admitting: Oncology

## 2019-02-26 ENCOUNTER — Inpatient Hospital Stay (HOSPITAL_BASED_OUTPATIENT_CLINIC_OR_DEPARTMENT_OTHER): Payer: No Typology Code available for payment source | Admitting: Adult Health

## 2019-02-26 ENCOUNTER — Other Ambulatory Visit: Payer: Self-pay | Admitting: *Deleted

## 2019-02-26 ENCOUNTER — Encounter: Payer: Self-pay | Admitting: General Practice

## 2019-02-26 ENCOUNTER — Ambulatory Visit: Payer: Medicare Other

## 2019-02-26 ENCOUNTER — Other Ambulatory Visit: Payer: Self-pay

## 2019-02-26 ENCOUNTER — Other Ambulatory Visit: Payer: Medicare Other

## 2019-02-26 ENCOUNTER — Other Ambulatory Visit: Payer: Self-pay | Admitting: Lab

## 2019-02-26 VITALS — BP 101/60 | HR 79 | Temp 98.5°F | Resp 18 | Ht 62.0 in | Wt 132.2 lb

## 2019-02-26 DIAGNOSIS — Z170 Estrogen receptor positive status [ER+]: Secondary | ICD-10-CM

## 2019-02-26 DIAGNOSIS — C7951 Secondary malignant neoplasm of bone: Secondary | ICD-10-CM | POA: Diagnosis not present

## 2019-02-26 DIAGNOSIS — C50411 Malignant neoplasm of upper-outer quadrant of right female breast: Secondary | ICD-10-CM | POA: Diagnosis not present

## 2019-02-26 DIAGNOSIS — I10 Essential (primary) hypertension: Secondary | ICD-10-CM

## 2019-02-26 NOTE — Patient Outreach (Addendum)
Member assessed for potential [facility] Care Management needs as a benefit of [place] Medicare.  Member remains at [facility] receiving rehab therapy. Anticipated dc from SNF is on 02/27/19.   Received request from [facility] LCSW for [place] Management follow up. Also made writer aware that member's husband is also in [facility].  Writer previously outreached to facility [employer] on 02/24/19 to discuss disposition plans. However, that discharge planner is no longer at the facility. Voicemail message left and secure message sent to current facility discharge planner to make aware of [place] Management follow up.   Telephone call made to Erica Young's contact numbers on file. No answer on either. Telephone call made to Erica Young (son) on file at [phone]. Patient identifiers confirmed.   Erica Young endorses that the disposition plan is for [name] to return home alone. Slated to dc home on 02/27/19 with home health. States [name]'s husband is in the same room as she is at [place].   Erica Young states he and his daughters will be able to assist but will not be with her 24/7. Erica Young states that his stepfather had caregivers prior but that was paid by his son in [place]. Inquired whether member has Medicaid. Erica Young states [name] has [employer] as secondary.   Erica Young states his mom could benefit from meal assistance. Will plan on making referral to [place]. Also discussed [place] Management RNCM for care coordination. Erica Young is agreeable to being secondary contact if [name] cannot be reached after SNF dc. Erica Young states his mother is having problems with her cell phone. Confirmed both home and mobile telephone numbers as being correct in system for [name].  Will send [place] Management and writer's contact information to [place].  Ongoing collaboration with [facility] LCSW who reports she will have transportation arranged. Writer inquired if  Oncologist could please consider an outpatient palliative care referral for [name]. [facility] LCSW reports facility indicated [name] will have home health with [facility] for PT/OT/RN.  Will plan to link [place] Management team (after referral made) and [place] LCSW for ongoing collaboration post SNF dc.  Will make referral to [place] for complex case management and care coordination and [facility] Social Worker for meal assistance.   Appreciative of [facility] and collaboration.  Addendum: Clarification. Assistance requested for transportation to PCP appointments. Will place referral for [place] for  transportation assistance as well per [facility] LCSW request.    Marthenia Rolling, MSN-Ed, RN,BSN [place] Acute Care Coordinator [phone] [facility]) [phone]  (Toll free office)

## 2019-02-26 NOTE — Progress Notes (Signed)
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   She continues on fulvestrant with a dose due today. She tolerates this well.   The patient also continues on palbociclib, currently at 75 mg daily, 21 days on, 7 days off.   At her last Fulvestrant injection, she had a syncopal event, and nursing staff noted facial droop/slurred speech and left sided weakness.  She was taken to the ER and admitted. She has a recent dental abscess and was continued on Augmentin until she can see the dentist for extraction, she also had new onset acute and systolic CHF and was evaluated by cardiology and started on betablocker, spironalactone, and losartan.  Her Palbociclib and fulvestrant have been held since her discahrge.     REVIEW OF SYSTEMS: Erica Young says her appetite is improved.  She is having three meals per day and is eating increasing amounts of her food.  She says she will eat if it food she likes to eat.  She is 132 pounds today.  I cannot locate a discharge weight on her.  She says she is still somewhat dizzy and that has been present since she was admitted to the hospital on 10/1, this is much improved.     She is taking the new cardiac medications that she was prescribed for her heart failure and says she is tolerating these well.  Erica Young has anxiety.  She notes this is doing ok right now.  She has xanax at the nursing home, however she is unclear if she is getting this or not.  She says she is going to be discharged tomorrow from the SNF.  She says there is no one at home to help her and she is unsure who can help her once she goes home.  She has been working with physical therapy doing various exercises and notes that she is regaining her strength.    Erica Young is disappointed that Dr. Jana Hakim is not here today.  She does not want to receive any further treatment until she regains her strength, and continues to recover.  She says that the second injection she received when she had the syncopal episode, she felt it go into her skin, then into her mouth and she could taste a salty taste, and then she saw orange in her left eye, and then she blacked out.  She is scared to receive another injection because of this effect.  She wants to know what her other options are.     BREAST CANCER HISTORY: From the earlier summary note  Erica Young tells me in 2007 while living in [place] she was found to have a very small cancer in the upper-outer quadrant of the right breast, about the size of the P, noted on mammography. It was not palpable. She had a  right lumpectomy and full sentinel lymph node sampling. She then received adjuvant radiation, to a total of 33 treatments. She received no systemic therapy.  She then did well until December 2014, when she had a fall and complain of pain in her left ribs. Rib films and chest x-ray on 04/13/2013 showed no fractures, but CT of the abdomen and pelvis on the same day found subcarinal and right hilar adenopathy. This was followed up with a chest CT scan 05/05/2013 confirming extensive mediastinal and right hilar lymphadenopathy, with multiple pulmonary nodules, the largest  measuring 0.9 cm. PET scan 05/21/2013 showed hypermetabolic adenopathy in the right and left paratracheal areas, the precarinal, subcarinal and right hilar areas, but no involvement of the liver, and the lung nodules were not hypermetabolic (although they were below the level of reliable detection). In addition, at L5 there was a lucency measuring 1.2 cm.  The PET scan also showed a hypermetabolic focus on the thyroid gland, which was evaluated with neck ultrasound and biopsy 94/76/5465, showing a follicular lesion of undetermined significance (([id number]).  The patient was referred to pulmonary [Dr Melvyn Novas and Dr Julien Nordmann for further evaluation. Repeat PET scan 12/28/2013 showed, in addition to the adenopathy previously noted, now multiple bony metastases. On 01/07/2014 the patient underwent bronchoscopy and this showed ([id number], together with separate cytology] a low-grade mucinous invasive breast cancer, estrogen receptor 100% positive, progesterone receptor 14% positive, with no HER-2 amplification, the signals ratio being 1.30 and the number per cell 1.95.  The patient was started on anastrozole 01/22/2014; monthly denosumab/Xgeva was added 07/30/2014. She appeared to tolerate this well, and her CA-27-29 (127 at baseline) normalized. Most recent CT scans of chest abdomen and pelvis 10/03/2015 showed continuing response. Despite this good news however, the patient decided to go off anastrozole and denosumab/Xgeva as of June 2017. By the time she saw Dr. Earlie Server again in September 2017 her tumor marker had doubled. At that time the patient was referred to the breast clinic for a second opinion regarding further evaluation and treatment.   PAST MEDICAL HISTORY: Past Medical History:  Diagnosis Date   Allergy    Anxiety    Bone cancer ([place]) mri 11/11/14   right parietal bone,left cribriform plate metastases   Cancer ([place]) 2007   right breat ca  , lumpectomy and radiation tx (declined chemo  and additional prophylactic meds)   Cataract    both eyes  10/2016 Lt.     04/2017 Rt.    CEREBROVASCULAR DISEASE 03/03/2010   Diverticulitis    DIVERTICULOSIS, COLON 03/03/2010   Fatty liver 08/02/09   as per U/S done by [facility]   Foramen ovale    positive bubble study   GERD (gastroesophageal reflux disease)    Headache(784.0)    she thinks sinus headaches   History of cerebral artery stenosis    right middle   HYPERLIPIDEMIA 03/03/2010   Hypertension    HYPOTHYROIDISM 03/03/2010   no longer on meds   Internal hemorrhoid    Low back pain    Mastoiditis    noted on brain MRI   Rib fracture    Right leg pain    Stroke [facility]) Oct. 2011   TIA   Ulcer     PAST SURGICAL HISTORY: Past Surgical History:  Procedure Laterality Date   BREAST LUMPECTOMY     right   CATARACT EXTRACTION Left    10/2016   Rt 04/2017   CHOLECYSTECTOMY     COLONOSCOPY  POLYPECTOMY     benign cecum   SHOULDER SURGERY     right shoulder (dislocation)   TONSILLECTOMY     TUBAL LIGATION     UPPER GASTROINTESTINAL ENDOSCOPY     VIDEO BRONCHOSCOPY WITH ENDOBRONCHIAL ULTRASOUND N/A 01/07/2014   Procedure: VIDEO BRONCHOSCOPY WITH ENDOBRONCHIAL ULTRASOUND;  Surgeon: Ivin Poot, MD;  Location: [facility];  Service: Thoracic;  Laterality: N/A;    FAMILY HISTORY Family History  Problem Relation Age of Onset   Heart disease Sister        cerebral vascular disease also   Heart disease Brother        cerebral vascular disease also   Heart disease Brother        cerebral vascular disease   Heart disease Sister        cebreal vascular disease also   Heart disease Sister        cebreal vascular diease also   Heart disease Sister        cerebral vascular diease also   Stomach cancer Father    Colon cancer Neg Hx    Esophageal cancer Neg Hx    Rectal cancer Neg Hx    Colon polyps Neg Hx    Asthma Neg Hx   The patient's father died from stomach  cancer in his late 77s. The patient's mother died in her 45s from a stroke. The patient has 2 brothers, 4 sisters. One sister had leukemia. There is no history of breast, colon, or ovarian cancer in the family to her knowledge   GYNECOLOGIC HISTORY:  No LMP recorded. Patient is postmenopausal. Menarche age 34, first live birth age 58, the patient is [place] P2. She went through menopause in her late 41s. She took no hormone replacement. She took oral contraceptives for approximately 2 years remotely without complications.   SOCIAL HISTORY: (updated 05/22/2018) Darris is originally from [place], [place]. She worked as a Education officer, museum, particularly, in the field of substance abuse. Her husband, Mortimer Fries, is a retired substance abuse Social worker. This is a second marriage for both of them. Atticus has 2 children from her first marriage, [name], lives in [place], and worked as a Audiological scientist but is now disabled, and Lawrence Santiago, who lives in [place] and works in Engineer, mining. Mikki Santee has 3 children from his first marriage, Tanna Furry, [place], and [place]. [name] Junior lives in [place] and has his own trucking business. Mikki Santee tells me he is estranged from his 2 daughters Amedeo Gory (a retired Pharmacist, hospital) and Salena Saner (who works in a bank). They are living on [place].The patient has 8 grandchildren. Amariyah attends [employer].   ADVANCED DIRECTIVES: in place   HEALTH MAINTENANCE: Social History   Tobacco Use   Smoking status: Former Smoker    Packs/day: 0.50    Years: 20.00    Pack years: 10.00    Types: Cigarettes    Quit date: 05/01/1991    Years since quitting: 27.8   Smokeless tobacco: Never Used  Substance Use Topics   Alcohol use: No    Alcohol/week: 0.0 standard drinks   Drug use: No     Colonoscopy:  PAP:  Bone density:   Allergies  Allergen Reactions   Ciprofloxacin Anaphylaxis    Current Outpatient Medications  Medication Sig Dispense Refill    acetaminophen (TYLENOL) 325 MG tablet Take 650 mg by mouth daily as needed for headache (toothpain).     ALPRAZolam (XANAX) 0.25 MG tablet Take 1  tablet (0.25 mg total) by mouth 3 (three) times daily as needed for anxiety. 10 tablet 0   aspirin EC 81 MG tablet Take 81 mg by mouth daily after breakfast.      carvedilol (COREG) 12.5 MG tablet Take 1 tablet (12.5 mg total) by mouth 2 (two) times daily with a meal. 60 tablet 2   Cholecalciferol (VITAMIN D PO) Take 1,000 Units by mouth 2 (two) times daily.     docusate sodium (COLACE) 100 MG capsule Take 100 mg by mouth daily as needed for mild constipation.     Glucosamine 500 MG CAPS Take 500 mg by mouth daily.      loratadine (CLARITIN) 10 MG tablet Take 1 tablet (10 mg total) by mouth daily. 30 tablet 11   losartan (COZAAR) 25 MG tablet Take 0.5 tablets (12.5 mg total) by mouth daily. 30 tablet 2   OVER THE COUNTER MEDICATION [place] for gas, takes twice daily     Probiotic Product (PROBIOTIC DAILY PO) Take 1 tablet by mouth daily.      spironolactone (ALDACTONE) 25 MG tablet Take 0.5 tablets (12.5 mg total) by mouth daily. 30 tablet 2   No current facility-administered medications for this visit.     OBJECTIVE: Middle-aged Latin American woman in no acute distress  Vitals:   02/26/19 0818  BP: 101/60  Pulse: 79  Resp: 18  Temp: 98.5 F (36.9 C)  SpO2: 97%     Body mass index is 24.18 kg/m.    ECOG FS:0 - Asymptomatic  Declined physical exam.  She appears well, slightly agitated, but respectful, behavior is normal.  She appears tired, and cachectic.      LAB RESULTS:  CMP     Component Value Date/Time   NA 137 02/04/2019 0330   NA 138 04/18/2017 0853   K 4.2 02/04/2019 0330   K 4.4 04/18/2017 0853   CL 103 02/04/2019 0330   CO2 25 02/04/2019 0330   CO2 29 04/18/2017 0853   GLUCOSE 112 (H) 02/04/2019 0330   GLUCOSE 95 04/18/2017 0853   BUN 17 02/04/2019 0330   BUN 16.6 04/18/2017 0853   CREATININE 0.82  02/04/2019 0330   CREATININE 0.77 01/29/2019 0855   CREATININE 0.8 04/18/2017 0853   CALCIUM 8.7 (L) 02/04/2019 0330   CALCIUM 9.2 04/18/2017 0853   PROT 7.1 01/29/2019 1048   PROT 7.0 04/18/2017 0853   ALBUMIN 4.0 01/29/2019 1048   ALBUMIN 3.8 04/18/2017 0853   AST 28 01/29/2019 1048   AST 16 01/29/2019 0855   AST 17 04/18/2017 0853   ALT 16 01/29/2019 1048   ALT 13 01/29/2019 0855   ALT 16 04/18/2017 0853   ALKPHOS 73 01/29/2019 1048   ALKPHOS 90 04/18/2017 0853   BILITOT 0.6 01/29/2019 1048   BILITOT 0.3 01/29/2019 0855   BILITOT 0.63 04/18/2017 0853   GFRNONAA >60 02/04/2019 0330   GFRNONAA >60 01/29/2019 0855   GFRAA >60 02/04/2019 0330   GFRAA >60 01/29/2019 0855    INo results found for: SPEP, UPEP  Lab Results  Component Value Date   WBC 3.0 (L) 02/04/2019   NEUTROABS 1.3 (L) 02/02/2019   HGB 12.6 02/04/2019   HCT 35.3 (L) 02/04/2019   MCV 110.0 (H) 02/04/2019   PLT 237 02/04/2019      Chemistry      Component Value Date/Time   NA 137 02/04/2019 0330   NA 138 04/18/2017 0853   K 4.2 02/04/2019 0330   K 4.4 04/18/2017  0853   CL 103 02/04/2019 0330   CO2 25 02/04/2019 0330   CO2 29 04/18/2017 0853   BUN 17 02/04/2019 0330   BUN 16.6 04/18/2017 0853   CREATININE 0.82 02/04/2019 0330   CREATININE 0.77 01/29/2019 0855   CREATININE 0.8 04/18/2017 0853      Component Value Date/Time   CALCIUM 8.7 (L) 02/04/2019 0330   CALCIUM 9.2 04/18/2017 0853   ALKPHOS 73 01/29/2019 1048   ALKPHOS 90 04/18/2017 0853   AST 28 01/29/2019 1048   AST 16 01/29/2019 0855   AST 17 04/18/2017 0853   ALT 16 01/29/2019 1048   ALT 13 01/29/2019 0855   ALT 16 04/18/2017 0853   BILITOT 0.6 01/29/2019 1048   BILITOT 0.3 01/29/2019 0855   BILITOT 0.63 04/18/2017 0853      No components found for: [id number]  No results for input(s): INR in the last 168 hours.  Urinalysis    Component Value Date/Time   COLORURINE STRAW (A) 11/19/2016 1750   APPEARANCEUR CLEAR  11/19/2016 1750   LABSPEC 1.003 (L) 11/19/2016 1750   PHURINE 7.0 11/19/2016 1750   GLUCOSEU NEGATIVE 11/19/2016 1750   HGBUR NEGATIVE 11/19/2016 1750   BILIRUBINUR NEGATIVE 11/19/2016 1750   BILIRUBINUR neg 02/16/2015 1318   KETONESUR NEGATIVE 11/19/2016 1750   PROTEINUR NEGATIVE 11/19/2016 1750   UROBILINOGEN 0.2 02/16/2015 1318   UROBILINOGEN 0.2 11/18/2013 1321   NITRITE NEGATIVE 11/19/2016 1750   LEUKOCYTESUR TRACE (A) 11/19/2016 1750     STUDIES: Ct Angio Head W/cm &/or Wo Cm  Result Date: 01/29/2019 CLINICAL DATA:  Left-sided weakness, slurred speech, headache. EXAM: CT ANGIOGRAPHY HEAD AND NECK TECHNIQUE: Multidetector CT imaging of the head and neck was performed using the standard protocol during bolus administration of intravenous contrast. Multiplanar CT image reconstructions and MIPs were obtained to evaluate the vascular anatomy. Carotid stenosis measurements (when applicable) are obtained utilizing NASCET criteria, using the distal internal carotid diameter as the denominator. CONTRAST:  173m OMNIPAQUE IOHEXOL 350 MG/ML SOLN COMPARISON:  Noncontrast head CT 11/29/2018, brain MRI 11/11/2014, MRA head neck 12/19/2009, nuclear medicine bone scan 03/17/2018 FINDINGS: CTA NECK FINDINGS Aortic arch: Common origin of the innominate and left common carotid arteries. Included portions of the aortic arch demonstrate no evidence of dissection or aneurysm. Mild atherosclerotic calcification of the visualized aortic arch. Right carotid system: CCA and ICA widely patent within the neck without significant stenosis. Left carotid system: CCA and ICA widely patent within the neck without significant stenosis. Vertebral arteries: The right vertebral artery is slightly dominant. Streak artifact from a dense left-sided contrast bolus limits evaluation of the V1 and proximal V2 left vertebral artery. Within this limitation, the bilateral vertebral arteries are patent within the neck without significant  stenosis (50% or greater). Skeleton: Mild cervical spondylosis. Known right parietal calvarial metastasis. Please refer to previous nuclear medicine bone scan 03/17/2018 for description of additional osseous metastases. Other neck: No mass or enlarged lymph nodes. The thyroid gland is mildly heterogeneous but otherwise unremarkable. Upper chest: No consolidation within the imaged lung apices. Review of the MIP images confirms the above findings CTA HEAD FINDINGS Anterior circulation: Mild scattered soft and calcified plaque within the intracranial internal carotid arteries. The intracranial internal carotid arteries are patent bilaterally without significant stenosis. Right middle and anterior cerebral arteries patent without high-grade proximal stenosis. Left middle and anterior cerebral arteries patent without high-grade proximal stenosis. 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 A tiny 1 mm aneurysm arising from a right M2 branch vessel is also questioned (series 7, image 230) (series 9, image 63). Posterior circulation: Intracranial vertebral arteries are patent without high-grade stenosis. Basilar artery is patent without high-grade stenosis. Bilateral posterior cerebral arteries patent without high-grade proximal stenosis. Predominantly fetal origin of the right posterior cerebral artery. Venous sinuses: Within limitations of contrast timing, no convincing thrombus. Anatomic variants: None significant Other: Again demonstrated is a mass centered within the region of the left cribriform plate and left ethmoid sinuses. The mass measures approximately 4.1 x 3.0 x 5.0 cm (AP x TV x CC) . There is associated expansile bony remodeling with osseous erosion through the cribriform plate into the intracranial compartment. There is also lateral bowing/erosion of the left lamina papyracea. The mass extends into the left  nasal passage and partly into the left maxillary sinus. Associated complete a opacification of obstructed left frontal and left sphenoid sinuses. There is also extensive partial opacification of bilateral ethmoid air cells. Significant rightward deviation of the bony nasal septum. Previously this mass measured 2.8 x 2.8 x 2.8 cm on MRI 11/11/2014. Review of the MIP images confirms the above findings IMPRESSION: CTA head: 1. No intracranial large vessel occlusion or proximal high-grade arterial stenosis identified. 2. 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 3. Question tiny 1 mm aneurysm arising from a right M2 branch vessel, as described. 4. 4.1 x 3.0 x 5.0 cm mass centered in the region of the left cribriform plate/left ethmoid sinuses with expansile bony remodeling and osseous erosion as described. As before, there is erosion of the cribriform plate with some extension into the intracranial compartment. Although non-specific, this mass may reflect a metastasis and has increased in size since MRI 11/11/2014. CTA neck: 1. The bilateral common and cervical internal carotid arteries are widely patent without significant stenosis 2. Streak artifact from a dense left-sided contrast bolus limits evaluation of the V1 and proximal V2 left vertebral artery. Within this limitation, the bilateral cervical vertebral arteries are patent without significant stenosis. 3. Mild atherosclerotic disease within the visualized aortic arch. Electronically Signed   By: Kellie Simmering   On: 01/29/2019 12:25   Ct Angio Neck W And/or Wo Contrast  Result Date: 01/29/2019 CLINICAL DATA:  Left-sided weakness, slurred speech, headache. EXAM: CT ANGIOGRAPHY HEAD AND NECK TECHNIQUE: Multidetector CT imaging of the head and neck was performed using the standard protocol during bolus administration of intravenous contrast. Multiplanar CT image reconstructions and MIPs were obtained to evaluate  the vascular anatomy. Carotid stenosis measurements (when applicable) are obtained utilizing NASCET criteria, using the distal internal carotid diameter as the denominator. CONTRAST:  179m OMNIPAQUE IOHEXOL 350 MG/ML SOLN COMPARISON:  Noncontrast head CT 11/29/2018, brain MRI 11/11/2014, MRA head neck 12/19/2009, nuclear medicine bone scan 03/17/2018 FINDINGS: CTA NECK FINDINGS Aortic arch: Common origin of the innominate and left common carotid arteries. Included portions of the aortic arch demonstrate no evidence of dissection or aneurysm. Mild atherosclerotic calcification of the visualized aortic arch. Right carotid system: CCA and ICA widely patent within the neck without significant stenosis. Left carotid system: CCA and ICA widely patent within the neck without significant stenosis. Vertebral arteries: The right vertebral artery is slightly dominant. Streak artifact from a dense left-sided contrast bolus limits evaluation of the V1 and proximal V2 left vertebral artery. Within this limitation, the bilateral vertebral arteries are patent within the neck  without significant stenosis (50% or greater). Skeleton: Mild cervical spondylosis. Known right parietal calvarial metastasis. Please refer to previous nuclear medicine bone scan 03/17/2018 for description of additional osseous metastases. Other neck: No mass or enlarged lymph nodes. The thyroid gland is mildly heterogeneous but otherwise unremarkable. Upper chest: No consolidation within the imaged lung apices. Review of the MIP images confirms the above findings CTA HEAD FINDINGS Anterior circulation: Mild scattered soft and calcified plaque within the intracranial internal carotid arteries. The intracranial internal carotid arteries are patent bilaterally without significant stenosis. Right middle and anterior cerebral arteries patent without high-grade proximal stenosis. Left middle and anterior cerebral arteries patent without high-grade proximal  stenosis. 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 A tiny 1 mm aneurysm arising from a right M2 branch vessel is also questioned (series 7, image 230) (series 9, image 63). Posterior circulation: Intracranial vertebral arteries are patent without high-grade stenosis. Basilar artery is patent without high-grade stenosis. Bilateral posterior cerebral arteries patent without high-grade proximal stenosis. Predominantly fetal origin of the right posterior cerebral artery. Venous sinuses: Within limitations of contrast timing, no convincing thrombus. Anatomic variants: None significant Other: Again demonstrated is a mass centered within the region of the left cribriform plate and left ethmoid sinuses. The mass measures approximately 4.1 x 3.0 x 5.0 cm (AP x TV x CC) . There is associated expansile bony remodeling with osseous erosion through the cribriform plate into the intracranial compartment. There is also lateral bowing/erosion of the left lamina papyracea. The mass extends into the left nasal passage and partly into the left maxillary sinus. Associated complete a opacification of obstructed left frontal and left sphenoid sinuses. There is also extensive partial opacification of bilateral ethmoid air cells. Significant rightward deviation of the bony nasal septum. Previously this mass measured 2.8 x 2.8 x 2.8 cm on MRI 11/11/2014. Review of the MIP images confirms the above findings IMPRESSION: CTA head: 1. No intracranial large vessel occlusion or proximal high-grade arterial stenosis identified. 2. 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 3. Question tiny 1 mm aneurysm arising from a right M2 branch vessel, as described. 4. 4.1 x 3.0 x 5.0 cm mass centered in the region of the left cribriform plate/left ethmoid sinuses with expansile bony remodeling and osseous  erosion as described. As before, there is erosion of the cribriform plate with some extension into the intracranial compartment. Although non-specific, this mass may reflect a metastasis and has increased in size since MRI 11/11/2014. CTA neck: 1. The bilateral common and cervical internal carotid arteries are widely patent without significant stenosis 2. Streak artifact from a dense left-sided contrast bolus limits evaluation of the V1 and proximal V2 left vertebral artery. Within this limitation, the bilateral cervical vertebral arteries are patent without significant stenosis. 3. Mild atherosclerotic disease within the visualized aortic arch. Electronically Signed   By: Kellie Simmering   On: 01/29/2019 12:25   Ct Chest W Contrast  Result Date: 02/02/2019 CLINICAL DATA:  Metastatic breast cancer.  Shortness of breath. EXAM: CT CHEST WITH CONTRAST TECHNIQUE: Multidetector CT imaging of the chest was performed during intravenous contrast administration. CONTRAST:  51m OMNIPAQUE IOHEXOL 300 MG/ML  SOLN COMPARISON:  Chest CT 03/17/2018 and 04/24/2017. FINDINGS: Cardiovascular: Mild atherosclerosis of the aorta and coronary arteries. No evidence of acute pulmonary embolism or other acute vascular findings on non dedicated imaging.  The heart size is normal. There is no pericardial effusion. Mediastinum/Nodes: Again demonstrated are multiple mildly enlarged mediastinal, right hilar and right internal mammary lymph nodes. These appear similar to most recent prior study, and include a 16 mm AP window node on image 50/3 and a subcarinal node measuring 15 mm on image 63/3. No progressive adenopathy identified. There is no axillary adenopathy post axillary node dissection. The thyroid gland, trachea and esophagus demonstrate no significant findings. Lungs/Pleura: There is no pleural effusion or pneumothorax. There is stable mild central airway thickening and biapical scarring. The previously demonstrated clustered  nodularity posteriorly in the right upper lobe has improved, likely resolving inflammation or scarring. Other nodules are stable, including a 6 mm right lower lobe nodule (image 81/4) and a subpleural nodule in the lingula measuring 12 x 11 mm on image 75/4. No new or enlarging nodules identified. Upper abdomen: No acute findings are seen within the visualized upper abdomen. There is a possible new low-density lesion in the dome of the right hepatic lobe measuring 8 mm on image 84/3. 2.4 cm right adrenal nodule on image 130/3 has not significantly changed. Musculoskeletal/Chest wall: Stable sclerosis within the T8 vertebral body. No new osseous lesions or pathologic fractures. No evidence of chest wall mass. IMPRESSION: 1. Mediastinal, right hilar and right internal mammary adenopathy has not significantly changed, present on multiple prior studies. 2. Scattered pulmonary nodularity is stable as well. 3. Stable right adrenal metastasis. Possible new lesion medially in the dome of the right hepatic lobe, potentially a metastasis. 4. Stable osseous metastatic disease. 5. No acute chest findings. Electronically Signed   By: Richardean Sale M.D.   On: 02/02/2019 20:37   Mr Brain Wo Contrast  Result Date: 01/29/2019 CLINICAL DATA:  78 year old female with code stroke presentation. Sudden onset left side weakness at 0955 hours. Slurred speech and headache. 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 EXAM: MRI HEAD WITHOUT CONTRAST TECHNIQUE: Multiplanar, multiecho pulse sequences of the brain and surrounding structures were obtained without intravenous contrast. COMPARISON:  CT head, CTA head and neck earlier today. Brain MRI 11/01/2014. FINDINGS: Brain: No restricted diffusion to suggest acute infarction. No midline shift, mass effect, evidence of mass lesion, ventriculomegaly, extra-axial collection or acute intracranial hemorrhage. Cervicomedullary junction and pituitary are within normal limits.  Mild for age scattered subcortical cerebral white matter T2 and FLAIR hyperintensity in both hemispheres (series 6, image 18) has mildly increased since 2016. There could be a subtle area of right superior sensory strip encephalomalacia on image 21 which is chronic. No evidence of vasogenic edema. No other cortical encephalomalacia. No chronic cerebral blood products. The deep gray nuclei and brainstem are negative. The deep gray nuclei, brainstem and cerebellum are negative; probable small crossing right cerebellar vessel rather than tiny chronic lacune on series 8, image 31. Vascular: Major intracranial vascular flow voids are stable since 2016. Skull and upper cervical spine: Regressed right posterior skull metastasis since 2016 on series 6, image 14. heterogeneous bone marrow signal elsewhere with no destructive osseous lesion identified. Negative visible cervical spine. Sinuses/Orbits: Large and chronic but substantially increased heterogeneously T2 hyperintense mass centered in the left nasal cavity now estimated up to 47 millimeters diameter (29 millimeters in 2016). Subsequent left side paranasal sinus obstruction with diffuse retained secretions and mucosal thickening. Interestingly, the mass demonstrates facilitated diffusion (series 901, image 36). No contrast administered today. Previously the mass was homogeneously enhancing. The right ethmoid air cells are also obstructed and opacified, but the  remaining right paranasal sinuses are well pneumatized. The bilateral orbits soft tissues remain normal. Other: Grossly negative visible internal auditory structures. Chronic right mastoid effusion is stable. Left mastoids remain clear. Negative scalp soft tissues. IMPRESSION: 1. Negative for acute infarct, and no significant change in the noncontrast MRI appearance of the brain since 2016. 2. 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  Subsequent paranasal sinus obstruction and left-side sinus mucocele formation. 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 Follow-up with ENT recommended. 3. Regressed right parietal bone metastasis since 2016. Electronically Signed   By: Genevie Ann M.D.   On: 01/29/2019 22:47   Mr Brain W Contrast  Result Date: 01/30/2019 CLINICAL DATA:  Follow-up abnormal brain MRI EXAM: MRI HEAD WITH CONTRAST TECHNIQUE: Multiplanar, multiecho pulse sequences of the brain and surrounding structures were obtained with intravenous contrast. CONTRAST:  32m GADAVIST GADOBUTROL 1 MMOL/ML IV SOLN COMPARISON:  Brain MRI from yesterday FINDINGS: Brain: No evidence of hematogenous metastasis to the brain. Vascular: Major vessels are enhancing Skull and upper cervical spine: Decreased enhancement and expansion of right parietal bone metastasis seen on comparison. There is only minimal enhancement today and no dural distortion. Sinuses/Orbits: Large mass centered in the left nasal cavity with cribriform plate extension and dural distortion. Mass measures up to 5 cm craniocaudal and obstructs left-sided sinuses. There is extensive bony erosion at the level of the mass, which has significantly enlarged since 2016. No visible brain invasion. IMPRESSION: 1. 5 cm left nasal cavity mass extending to the cribriform plate and causing postobstructive sinusitis. Given metastatic disease elsewhere has responded in this lesion has enlarged, favor a primary nasal mass rather than metastatic disease. 2. No worrisome findings at the regressed right parietal bone metastasis. 3. No evidence of metastasis to the brain. Electronically Signed   By: JMonte FantasiaM.D.   On: 01/30/2019 08:13   Ct Head Code Stroke Wo Contrast  Result Date: 01/29/2019 CLINICAL DATA:  Code stroke. Cerebral hemorrhage suspected,  possible stroke. [facility] Slurred speech. EXAM: CT HEAD WITHOUT CONTRAST TECHNIQUE: Contiguous axial images were obtained from the base of the skull through the vertex without intravenous contrast. COMPARISON:  Report from head CT 04/11/2015 (images unavailable), brain MRI [id number] FINDINGS: Brain: No evidence of acute intracranial hemorrhage. No demarcated cortical infarction. No midline shift or extra-axial fluid collection. Mild generalized parenchymal atrophy. Partially empty sella turcica. Vascular: No definite hyperdense vessel. Skull: No calvarial fracture. Irregular lucency within the right parietal bone consistent with known right parietal bone metastasis. Sinuses/Orbits: Incompletely imaged partially calcified mass centered in the region of the left cribriform plate and left ethmoid sinuses. As before, there is erosion through the cribriform plate with intracranial extension. Associated bony expansion and remodeling. This includes redemonstrated lateral bowing/erosion of the left lamina papyracea. The mass extends partly into the left nasal passage and left maxillary sinus. Associated complete opacification of the left frontal and sphenoid sinuses. The mass measures 3.8 x 3.1 cm (AP x TV) and at least 3.3 cm in craniocaudal dimension (series 2, image 1) (series 6, image 27). On prior brain MRI 11/11/2014, this mass measured 2.8 x 2.8 x 2.8 cm ASPECTS ([facility] Early CT Score) - Ganglionic level infarction (caudate,  lentiform nuclei, internal capsule, insula, M1-M3 cortex): 7 - Supraganglionic infarction (M4-M6 cortex): 3 Total score (0-10 with 10 being normal): 10 These results were called by telephone at the time of interpretation on 01/29/2019 at 10:53 am to provider [facility] , who verbally acknowledged these results. IMPRESSION: 1. No evidence of intracranial hemorrhage or acute demarcated cortical  infarction 2. Incompletely imaged partially calcified mass centered in the region of the left cribriform plate and left ethmoid sinuses. As before, there is erosion through the cribriform plate with intracranial extension. The mass measures 3.8 x 3.1 cm (AP x TV) and at least 3.3 cm in craniocaudal dimension (previously 2.8 x 2.8 x 2.8 cm on MRI 11/11/2014). Complete opacification of obstructed left frontal and left sphenoid sinuses. 3. Known right parietal bone metastasis. Electronically Signed   By: Kellie Simmering   On: 01/29/2019 11:06     ELIGIBLE FOR AVAILABLE RESEARCH PROTOCOL: no  ASSESSMENT: 78 y.o. 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  (a) bone scan and chest CT 07/09/2016 shows stable disease.  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  She tells me she is recovering well at the SNF and is due to be discharged tomorrow.  I apologized to her about the schedule change and that she is seeing myself instead of Dr. Jana Hakim.  She notes that she is ok with the change, but she really wants to review what her other treatment options are considering what happened with the fulvestrant.    I offered for her to restart treatment today after lab work.  She does not want to restart anything at this point, which sounds reasonable.  I offered to get her labs today, and arrange a virtual appointment with Dr. Jana Hakim next week when he returns.  She does not want to do this either.  She declines lab work, and I recommended that we check her labs in light of her being prescribed new medications, however she declines today.  Instead, and given the fact that her recent chest CT was stable, she wants to continue to work on recovering, and then return in 4 weeks for labs and f/u with Dr. Jana Hakim.  I let her know that if she gets here and doesn't want to have lab work before her appointment  with Dr. Jana Hakim, to just let us know, and we can always change that to afterward.  I also put an injection appt on her schedule in case she needs it, but that it is nothing definite until she meets and talks with Dr. Jana Hakim.    I will reach out to social work about her upcoming discharge and transportation concerns.    [place] is satisfied with the above plan.  She will return in 4 weeks for labs and f/u with Dr. Jana Hakim.  She was recommended to continue with the appropriate pandemic precautions. She knows to call for any questions that may arise between now and her next appointment.  We are happy to see her sooner if needed.  A total of (30) minutes of  face-to-face time was spent with this patient with greater than 50% of that time in counseling and care-coordination.  Wilber Bihari, NP  02/26/19 8:37 AM Medical Oncology and Hematology [facility] [address] [place], [place] [address] Tel. [phone]    Fax. [phone]

## 2019-02-26 NOTE — Addendum Note (Signed)
Addended by: Harless Litten on: 02/26/2019 03:47 PM   Modules accepted: Orders

## 2019-02-26 NOTE — Progress Notes (Addendum)
[place] CSW Progress Notes  Request from NP Wilber Bihari Corn[place] to investigate plan for patient care at home post SNF discharge anticipated for tomorrow.  Per record, patient is at [facility].  Left VM for SW at facility to understand discharge plan.  Per record, patient anticipated [facility] involvement (PT, OT, RN and Aide) at inpatient discharge this month.  Spoke w [facility] liaison Adela Lank [phone]).  He will reach out to facility to see if they can assist.  Also requested [facility] referral for care coordination help and transportation to non [facility] appointments if possible.  Messaged Transportation Services to ask them to contact patient next week when she is home to enroll in [employer].  Scheduled phone visit with patient next week to assess progress after return home.  Spoke w Rudi Rummage at [facility], states that she has arranged [place] PT, OT and RN with [facility], did not set up Aide.  Per [facility] nursing did not recommend a home aide.  Facility has had multiple contacts w son, Delfino Lovett, who has stated he will be in the home daily as well as his children.  Facility has had two care plan meetings with family, no concerns/barriers expressed by family.  Patient set to discharge tomorrow at [place] has ordered wheelchair from [facility] and son says he will provide walker. "She is doing good, independent with all her ADLs, walks 125 feet w rolling walker, independent w toileting and dressing.  PT has not found any limitations with ADLs.     Edwyna Shell, LCSW Clinical Social Worker Phone:  [phone]

## 2019-02-27 ENCOUNTER — Other Ambulatory Visit: Payer: Self-pay

## 2019-02-27 DIAGNOSIS — R55 Syncope and collapse: Secondary | ICD-10-CM | POA: Diagnosis not present

## 2019-02-27 DIAGNOSIS — R531 Weakness: Secondary | ICD-10-CM | POA: Diagnosis not present

## 2019-02-27 DIAGNOSIS — D72819 Decreased white blood cell count, unspecified: Secondary | ICD-10-CM | POA: Diagnosis not present

## 2019-02-27 DIAGNOSIS — C311 Malignant neoplasm of ethmoidal sinus: Secondary | ICD-10-CM | POA: Diagnosis not present

## 2019-02-27 NOTE — Patient Outreach (Signed)
[facility]) Care Management  02/27/2019  Tarea Skillman 1941/03/29 [id number]    High priority referral received from [place], Marthenia Rolling.  "Please assign to [place] for care coordination and complex case management and [facility] Social Worker for meal assistance. Member to dc from [facility] on 02/27/19 with [facility]. Will be home alone with family checking in. Followed closely by [place] LCSW. Will link for ongoing collaboration." Successful outreach to patient today.  Informed patient of meal delivery program through [employer] and she consented to referral.  Talked with patient about transportation needs.  Informed her of SCAT services and offered to complete/submit application.  Patient declined stating that her son can assist with transportation needs.  No other social work needs identified at this time. Closing [facility] Social Work case but happy to get involved again if needed.    Ronn Melena, BSW Social Worker [phone]

## 2019-02-28 DIAGNOSIS — Z87891 Personal history of nicotine dependence: Secondary | ICD-10-CM | POA: Diagnosis not present

## 2019-02-28 DIAGNOSIS — F419 Anxiety disorder, unspecified: Secondary | ICD-10-CM | POA: Diagnosis not present

## 2019-02-28 DIAGNOSIS — Z7982 Long term (current) use of aspirin: Secondary | ICD-10-CM | POA: Diagnosis not present

## 2019-02-28 DIAGNOSIS — E43 Unspecified severe protein-calorie malnutrition: Secondary | ICD-10-CM | POA: Diagnosis not present

## 2019-02-28 DIAGNOSIS — I69328 Other speech and language deficits following cerebral infarction: Secondary | ICD-10-CM | POA: Diagnosis not present

## 2019-02-28 DIAGNOSIS — M6281 Muscle weakness (generalized): Secondary | ICD-10-CM | POA: Diagnosis not present

## 2019-02-28 DIAGNOSIS — I700 Atherosclerosis of aorta: Secondary | ICD-10-CM | POA: Diagnosis not present

## 2019-02-28 DIAGNOSIS — K760 Fatty (change of) liver, not elsewhere classified: Secondary | ICD-10-CM | POA: Diagnosis not present

## 2019-02-28 DIAGNOSIS — K5790 Diverticulosis of intestine, part unspecified, without perforation or abscess without bleeding: Secondary | ICD-10-CM | POA: Diagnosis not present

## 2019-02-28 DIAGNOSIS — Z9181 History of falling: Secondary | ICD-10-CM | POA: Diagnosis not present

## 2019-02-28 DIAGNOSIS — C7951 Secondary malignant neoplasm of bone: Secondary | ICD-10-CM | POA: Diagnosis not present

## 2019-02-28 DIAGNOSIS — E785 Hyperlipidemia, unspecified: Secondary | ICD-10-CM | POA: Diagnosis not present

## 2019-02-28 DIAGNOSIS — D72819 Decreased white blood cell count, unspecified: Secondary | ICD-10-CM | POA: Diagnosis not present

## 2019-02-28 DIAGNOSIS — I5040 Unspecified combined systolic (congestive) and diastolic (congestive) heart failure: Secondary | ICD-10-CM | POA: Diagnosis not present

## 2019-02-28 DIAGNOSIS — I69392 Facial weakness following cerebral infarction: Secondary | ICD-10-CM | POA: Diagnosis not present

## 2019-02-28 DIAGNOSIS — I110 Hypertensive heart disease with heart failure: Secondary | ICD-10-CM | POA: Diagnosis not present

## 2019-02-28 DIAGNOSIS — M545 Low back pain: Secondary | ICD-10-CM | POA: Diagnosis not present

## 2019-02-28 DIAGNOSIS — I69354 Hemiplegia and hemiparesis following cerebral infarction affecting left non-dominant side: Secondary | ICD-10-CM | POA: Diagnosis not present

## 2019-02-28 DIAGNOSIS — C50911 Malignant neoplasm of unspecified site of right female breast: Secondary | ICD-10-CM | POA: Diagnosis not present

## 2019-03-02 ENCOUNTER — Other Ambulatory Visit: Payer: Self-pay | Admitting: *Deleted

## 2019-03-02 NOTE — Patient Outreach (Signed)
[facility]) Care Management  03/02/2019  Loni Delbridge Nov 10, 1940 [id number]   Telephone Assessment/Screen  Referral Date: 02/27/19 Referral Source: [facility] PAC Liaison, Lonn [place] Referral Reason: dx of Heart failure and cancer with mets, htn, cva, anxiety, FTT Please assign to [place] for care coordination and complex case management and [facility] Social Worker for meal assistance. Unable to reach member on mobile. Remains in SNF. Spoke with son [name] at [phone] about [facility] follow up. Member to dc from [facility] on 02/27/19 with [facility]. Will be home alone with family checking in. Followed closely by [place] LCSW. Will link for ongoing collaboration. Inquired about outpatient palliative care services. Please see writer's notes from 10/29 for further details. Please call with questions. Thanks. Marthenia Rolling, MSN-Ed, RN,BSN-THN Post Acute Care Coordinator-[phone]  Edwyna Shell is the [facility] LCSW who is involved in Mrs. Erica Young's care. Insurance:[employer] and [employer] Medicare supplement    Outreach attempt # 1 un successful  THN RN CM received no answer at the home number and left a HIPAA comliant message to include [facility] RN CM return number [facility] RN CM called the mobile number and Mrs Dungan answered but report she is in the call with her son driving home and has difficulty hearing on her mobile phone She states it is better to call her on her home number   [facility] RN CM noted she was assessed by [facility] SW on 02/27/19 with case closure     Plan: [facility] RN CM will follow up with Mrs Dembeck within 1-3 business days if no return call received from her   [place]. Lavina Hamman, RN, BSN, [place] Coordinator Office number [phone] Mobile number [phone]  Main THN number [phone] Fax number [phone]

## 2019-03-03 ENCOUNTER — Telehealth: Payer: Self-pay

## 2019-03-03 ENCOUNTER — Other Ambulatory Visit: Payer: Self-pay | Admitting: *Deleted

## 2019-03-03 NOTE — Patient Outreach (Signed)
[facility]) Care Management  03/03/2019  Altair Stanko 1940/05/22 [id number]   Telephone Assessment/Screen  Referral Date: 02/27/19 Referral Source: [facility] PAC Liaison, Lonn [place] Referral Reason: dx of Heart failure and cancer with mets, ht, CVA, anxiety, FTT Please assign to [place] for care coordination and complex case management and [facility] Social Worker for meal assistance. Unable to reach member on mobile. Remains in SNF. Spoke with son [name] at [phone] about [facility] follow up. Member to dc from [facility] on 02/27/19 with [facility]. Will be home alone with family checking in. Followed closely by [place] link for ongoing collaboration. Inquired about outpatient palliative care services. Please see writer's notes from 10/29 for further details. Please call with questions. Thanks. Marthenia Rolling, MSN-Ed, RN,BSN-THN Post Acute Care [phone]  Edwyna Shell is the [facility] LCSW who is involved in Mrs. Sotto's care. Insurance:[employer] Medicare and [employer] Medicare supplement  Outreach attempt #2   Richard, son answered THN RN CM discussed the [facility] RN CM reason for calling Richard shared with [facility] RN CM that Mr Camiyah Friberg away on 03/27/2019 night at [employer].  Richard confirms Mrs Bettendorf is surrounded and the care of family to include him, his children and other family and friends. He reports medically she has shown improvements since her d/c home. He reports "It may be a while now before she will be able to speak with you" [facility] RN CM offered condolences for the loss of Mr Smoak and to the family  THN RN CM discussed and offered [facility] SW services as a resource during this time of loss and discussed the [facility] unsuccessful letter to be sent with contact information for [facility] RN CM, [facility] services and 24 hour nurse line center. Richard was encouraged to call [facility] prn THN RN CM updated THN PAX Liaison on the passing of Mr  Cesaro    Plan: [facility] RN CM sent an unsuccessful outreach letter and scheduled this patient for another call attempt within 4-7 business days  Routed note to MD s  [name], RN, BSN, [place] Coordinator Office number [phone] Mobile number [phone]  Main THN number [phone] Fax number [phone]

## 2019-03-03 NOTE — Telephone Encounter (Signed)
[name] Encounter  [place] has attempted to contact the patient 3 times to schedule her Ibrance refill.  The patient stated she did not want to take Ibrance right now, she will see [place] on 11/25 and will talk about the Ibrance again.  I re-timed her call for the pharmacy and gave them this information as well.  [place] Patient [place] Phone [phone] Fax [phone] 03/03/2019   1:21 PM

## 2019-03-04 ENCOUNTER — Inpatient Hospital Stay: Payer: Medicare Other | Attending: Adult Health | Admitting: General Practice

## 2019-03-04 DIAGNOSIS — Z170 Estrogen receptor positive status [ER+]: Secondary | ICD-10-CM

## 2019-03-04 DIAGNOSIS — J349 Unspecified disorder of nose and nasal sinuses: Secondary | ICD-10-CM | POA: Insufficient documentation

## 2019-03-04 DIAGNOSIS — C7971 Secondary malignant neoplasm of right adrenal gland: Secondary | ICD-10-CM | POA: Insufficient documentation

## 2019-03-04 DIAGNOSIS — C50411 Malignant neoplasm of upper-outer quadrant of right female breast: Secondary | ICD-10-CM | POA: Insufficient documentation

## 2019-03-04 DIAGNOSIS — C7951 Secondary malignant neoplasm of bone: Secondary | ICD-10-CM | POA: Insufficient documentation

## 2019-03-04 DIAGNOSIS — C771 Secondary and unspecified malignant neoplasm of intrathoracic lymph nodes: Secondary | ICD-10-CM | POA: Insufficient documentation

## 2019-03-04 DIAGNOSIS — Z87891 Personal history of nicotine dependence: Secondary | ICD-10-CM | POA: Insufficient documentation

## 2019-03-04 DIAGNOSIS — C7800 Secondary malignant neoplasm of unspecified lung: Secondary | ICD-10-CM | POA: Insufficient documentation

## 2019-03-04 NOTE — Progress Notes (Signed)
[place] CSW Progress Notes  Scheduled call to patient post SNF discharge to ensure needs are met in the home.  Left VM on home phone, no VM on  mobile phone.  Per information received from [facility], [facility] recently died at [facility].  Per [facility], patient's son stated that all needs are met and that he and family are closely supporting patient w all needs.  Edwyna Shell, LCSW Clinical Social Worker Phone:  [phone]

## 2019-03-09 ENCOUNTER — Other Ambulatory Visit: Payer: Self-pay | Admitting: *Deleted

## 2019-03-09 NOTE — Patient Outreach (Signed)
[facility]) Care Management  03/09/2019  Erica Young [birth year] [id number]   Call attempt #3 for telephone assessment/screen  Referral Date:02/27/19 Referral Source:[facility] PAC Liaison, A [place] Referral Reason:dx of Heart failure and cancer with mets,ht, CVA, anxiety, FTT Please assign to [facility] RN CM for care coordination and complex case management and [facility] Social Worker for meal assistance. Unable to reach member on mobile. Remains in SNF. Spoke with son Erica Young at [phone] about [facility] follow up. Member to dc [facility]. Will be home alone with family checking in. Followed closely by [place] link for ongoing collaboration. Inquired about outpatient palliative care services. Please see writer's notes from 10/29 for further details. Please call with questions. Thanks. Erica Rolling, MSN-Ed, RN,BSN-THN Post Acute Care [phone]  Erica Young the [facility] LCSW who is involved in Erica Young's care. [place] Medicare supplement  Outreach attempt #3   No answer. [facility] RN CM left HIPAA compliant voicemail message along with CM's contact info.   Plan:  [facility] RN CM scheduled this patient for another call attempt within 7-14 business days  [facility] RN CM sent an unsuccessful outreach letter on 03/03/19    Routed note to MD s  [place]. Erica Hamman, RN, BSN, [place] Coordinator Office number [phone] Mobile number [phone]  Main [facility]N number [phone] Fax number [phone]

## 2019-03-10 ENCOUNTER — Ambulatory Visit: Payer: Medicare Other | Admitting: Internal Medicine

## 2019-03-10 ENCOUNTER — Other Ambulatory Visit: Payer: Self-pay | Admitting: *Deleted

## 2019-03-10 ENCOUNTER — Telehealth: Payer: Self-pay | Admitting: Internal Medicine

## 2019-03-10 ENCOUNTER — Encounter: Payer: Self-pay | Admitting: *Deleted

## 2019-03-10 DIAGNOSIS — I5040 Unspecified combined systolic (congestive) and diastolic (congestive) heart failure: Secondary | ICD-10-CM | POA: Diagnosis not present

## 2019-03-10 DIAGNOSIS — I110 Hypertensive heart disease with heart failure: Secondary | ICD-10-CM | POA: Diagnosis not present

## 2019-03-10 DIAGNOSIS — C50911 Malignant neoplasm of unspecified site of right female breast: Secondary | ICD-10-CM | POA: Diagnosis not present

## 2019-03-10 DIAGNOSIS — I69354 Hemiplegia and hemiparesis following cerebral infarction affecting left non-dominant side: Secondary | ICD-10-CM | POA: Diagnosis not present

## 2019-03-10 DIAGNOSIS — C7951 Secondary malignant neoplasm of bone: Secondary | ICD-10-CM | POA: Diagnosis not present

## 2019-03-10 DIAGNOSIS — M6281 Muscle weakness (generalized): Secondary | ICD-10-CM | POA: Diagnosis not present

## 2019-03-10 NOTE — Telephone Encounter (Signed)
[place] with [place] requesting verbal for home health nurse 1 wk 8  [phone]

## 2019-03-10 NOTE — Telephone Encounter (Signed)
Ok to order 

## 2019-03-10 NOTE — Telephone Encounter (Signed)
Left message on machine for [place] with verbal orders.

## 2019-03-10 NOTE — Patient Outreach (Signed)
Erica Young) Care Management  03/10/2019  Erica Young [birth year] [id number]   Telephone Assessment/Screen  Referral Date: 02/27/19 Referral Source: [facility] PAC Liaison, Erica Young Referral Reason: dx of Heart failure and cancer with mets, HTN, CVA, anxiety, FTT Please assign to [place] for care coordination and complex case management and [facility] Social Worker for meal assistance. Unable to reach member on mobile. Remains in SNF. Spoke with son Erica Young at [phone] about [name] follow up. Member to [facility] with [name]. Will be home alone with family checking in. Followed closely by [name] for ongoing collaboration. Inquired about outpatient palliative care services. Please see writer's notes from 10/29 for further details. Please call with questions. Thanks. Erica Rolling, MSN-Ed, Erica Young,Erica [phone]  Erica Young is the [facility] SW who is involved in Erica. Young's care. Insurance:[employer] Medicare and [employer] Medicare supplement    [facility] Erica Young Young received a return call from Erica Young at 81 about the [facility] Erica Young Young message left on 03/09/19 Erica Young returned a call to her at 95- Week 2   HIPAA verified  Erica Young questions were answered about the purpose of the [facility] Erica Young Young call  Erica Young discussed need for slower speech for her to understand the Erica Young offered the assistance of a language interpreter but she stated it was not needed at this time Erica Young spoke slower for her  Reviewed the referral to [place] after her d/c from [place] [facility] for care coordination and complex care [facility] Erica Young Young discussed [facility] program and services available after a patient's discharge from a facility  Erica Young was given time to ventilate her feelings. She was tearful during the call She apologized for " being emotional" support offered  At this time she reports she is grieving  the loss of her husband, Erica Young, "within a three day time" He passed on 2019-03-11 night at [place] offered for the lost of her husband  [facility] Erica Young Young reviewed [facility] SW services and availability for grief  At this time Erica Young reports she wants "to go through this myself" She reports episodes of feeling sad, lonely and then angry about her husband's death Depression scale completed PHQ 2=2 and PHQ 9 = 6  She confirms she is alone at the present time but her family were with her last week but most have returned to work and their routines. She reports time with family to review and set up payment for bills  Erica Young confirms she was seen today 03/10/19 by the [name] Erica Young to check her BP and to review her medications. She did not want to review these with [facility] Erica Young Young at this time   Social Erica Erica Young is a 78 year old retired female who lives at home alone with family checking in on her. Her husband Erica Young passes on 03/11/19 at [name] after she was d/c home from [name] on 02/27/19. She has supportive family and friends. Her son Erica Young is supportive. She is able to complete ADLs and needs assist with iADLs and transportation from her son and family. She does understand and speak english but requests english to be spoken slower so she can understand clearly.   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, bone mets, HTN, Cerebrovascular disease, hilar adenopathy, aortic atherosclerosis, hx of acute systolic CHF, hx of syncope, chronic rhinitis, colon diverticulosis, HLD, back pain, abdominal pain, hx of epistaxis, acute left sided weakness, anxiety, hx of sob, leukopenia due to antineoplastic chemo  Future Appointments 03/13/19 0730 Dr Jerilee Hoh rehab f/u 03/25/19 1200 oncology lab 1230 visit with Dr Jana Hakim oncologist 1315 medonc  flush 03/27/19 oncology medonc flush and labs 05/07/2019 0900 cardiology Dr Debara Pickett 3 month f/u   [facility] Erica Young Young will follow up with Erica Young within the next 7-14 days for further assessment of needs   Routed note to MD/Np/PA  Updated Cancer Young SW via Epic in basket [facility] Young Care Plan Problem One     Most Recent Value  Care Plan Problem One  Breast cancer wtih bone mets home care needs  Role Documenting the Problem One  Care Management Telephonic Young  Care Plan for Problem One  Active  [facility] Long Term Goal   over the next 60 days patient will receive knowledge of services and resources for home care of breast cancer with bone mets  THN Long Term Goal Start Date  03/10/19  Interventions for Problem One Long Term Goal  Assessed needs and allowed time for ventilation  THN Young Short Term Goal #1   over the next 30 days patient will be offered [facility] SW if she chooses for grief related to the loss of her husband  THN Young Short Term Goal #1 Start Date  03/10/19  Interventions for Short Term Goal #1  allowed patient time to ventilate, reviewed [facility] SW services and availability for grief counseling services and resources, listened to patient, offered condolescences and support       Kimberly L. Lavina Hamman, Erica Young, BSN, [place] [facility] Office number [phone] Mobile number [phone]  Main THN number [phone] Fax number [phone]

## 2019-03-11 ENCOUNTER — Other Ambulatory Visit: Payer: Self-pay | Admitting: *Deleted

## 2019-03-11 NOTE — Patient Outreach (Signed)
[facility]) Care Management  03/11/2019  Erica Young [birth year] [id number]  Call from patient/ Grief support   Erica Young called [facility] RN CM HIPAA verified   She was tearful and voiced sadness about the loss of her husband, Erica Young She was allowed to ventilate her feelings and [facility] RN CM discussed the importance of her being able to talk about the loss of her husband.  Support provided  She discussed becoming sad when she looked at Erica Young large picture over the fireplace. She recalled good memories of him taking care of her and bringing her gifts. They were married for 24 years. She met him in the [place] after he and she both were divorced. She discussed the close relationship her husband and son had.  She discussed requesting staff at [facility] place to watch over and care for her husband after she was d/c home on 02/27/19 Her son, Erica Young, visited today 03/11/19, had lunch with her and will visit on 03/12/19 to assist her in completing errands as she is not driving at this time. She discussed not wanting to call him since he just left her home and he has a distance to drive home. She felt he would have returned to her home.  She expresses wanting to have someone to speak with during her moments of sadness Erica Young voiced appreciation for [facility] RN CM listening to her, allowing her to talk about her loss and feelings.  Erica Young was encouraged to call prn    [facility] RN CM referred Erica Young to [facility] SW as she agreed Referral: Patient's husband, Erica Young passed on 03/08/19 at [employer] 3 days after she was d/c from [employer]. Her family visits but when they are not present she has moments of sadness. She agrees today to [facility] SW referral for grief resources. She and [facility]N RN CM discussed getting resources for a list/circle of support people she can call prn to continue to express her feelings about her loss  [facility] RN CM will follow up with Erica Young within the next 7-14 days  for further assessment of needs or if contacted by Erica Young  Routed note to MD/NA/PA  [facility] CM Care Plan Problem One     Most Recent Value  Care Plan Problem One  Breast cancer wtih bone mets home care needs  Role Documenting the Problem One  Care Management Telephonic Coordinator  Care Plan for Problem One  Active  THN Long Term Goal   over the next 60 days patient will receive knowledge of services and resources for home care of breast cancer with bone mets  THN Long Term Goal Start Date  03/10/19  Interventions for Problem One Long Term Goal  assessed needs and allowed time for ventilation of her feelings  THN CM Short Term Goal #1   over the next 30 days patient will be offered [facility] SW if she chooses for grief related to the loss of her husband  [facility] CM Short Term Goal #1 Start Date  03/11/19  Interventions for Short Term Goal #1  Referred to [place]. Erica Hamman, RN, BSN, [place] Coordinator Office number [phone] Mobile number [phone]  Main THN number [phone] Fax number [phone]

## 2019-03-13 ENCOUNTER — Ambulatory Visit (INDEPENDENT_AMBULATORY_CARE_PROVIDER_SITE_OTHER): Payer: Medicare Other | Admitting: Internal Medicine

## 2019-03-13 ENCOUNTER — Encounter: Payer: Self-pay | Admitting: Internal Medicine

## 2019-03-13 ENCOUNTER — Other Ambulatory Visit: Payer: Self-pay

## 2019-03-13 VITALS — BP 108/64 | HR 85 | Temp 97.9°F | Wt 131.1 lb

## 2019-03-13 DIAGNOSIS — F419 Anxiety disorder, unspecified: Secondary | ICD-10-CM | POA: Diagnosis not present

## 2019-03-13 DIAGNOSIS — C50411 Malignant neoplasm of upper-outer quadrant of right female breast: Secondary | ICD-10-CM | POA: Diagnosis not present

## 2019-03-13 DIAGNOSIS — J310 Chronic rhinitis: Secondary | ICD-10-CM

## 2019-03-13 DIAGNOSIS — Z634 Disappearance and death of family member: Secondary | ICD-10-CM | POA: Diagnosis not present

## 2019-03-13 DIAGNOSIS — I5042 Chronic combined systolic (congestive) and diastolic (congestive) heart failure: Secondary | ICD-10-CM

## 2019-03-13 DIAGNOSIS — Z170 Estrogen receptor positive status [ER+]: Secondary | ICD-10-CM

## 2019-03-13 MED ORDER — ONDANSETRON 4 MG PO TBDP: 4.0000 mg | ORAL_TABLET | Freq: Three times a day (TID) | ORAL | 0 refills | Status: DC | PRN

## 2019-03-13 NOTE — Patient Instructions (Signed)
-  Nice seeing you today!!  -Schedule follow up in 3 months. 

## 2019-03-13 NOTE — Progress Notes (Signed)
Established Patient Office Visit     CC/Reason for Visit: Hospital and SNF follow-up  HPI: Erica Young is a 78 y.o. female who is coming in today for the above mentioned reasons. Past Medical History is significant for: Hypertension that has been well controlled, hyperlipidemia, stage IV breast cancer followed by Dr. Jana Hakim.  She was hospitalized from 01/29/2019-02/04/2019 after suffering a syncopal event while receiving an infusion at the [place] for her breast cancer.  She was admitted to the hospital where she was found to have acute combined heart failure with ejection fraction of 30 to 35%.  Cardiology was consulted and they recommended medication management.  She has been started on aspirin, losartan, carvedilol, spironolactone.  From there she was discharged to skilled nursing for rehab purposes.  She feels that she is a little stronger.  Unfortunately while at rehab her husband [name] of 20+ years ended up passing away due to his metastatic malignant melanoma.  She has had an unmeasurable amount of grief.  She states today: "I feel like my body is giving out and it would be better if I could be with him".  She denies active suicidal ideation.  She is being actively managed by [facility], she contacted them 2 days ago due to her grief and it appears like they are going to be sending out a Education officer, museum.  Since being discharged from rehab she has felt very lonely and sad, has had some nausea and mild dizziness and has felt extremely nervous.  She has not been using her alprazolam more than is prescribed as on previous visits she has commented on the fact that she is afraid to "get addicted to it".  She also mentions today that she is unsure that she would want to continue treatment for her breast cancer and states that she is willing to consider hospice but she needs to talk to Dr. Jana Hakim first.   Past Medical/Surgical History: Past Medical History:  Diagnosis Date  . Allergy   .  Anxiety   . Bone cancer ([place]) mri 11/11/14   right parietal bone,left cribriform plate metastases  . Cancer [facility]) 2007   right breat ca  , lumpectomy and radiation tx (declined chemo and additional prophylactic meds)  . Cataract    both eyes  10/2016 Lt.     04/2017 Rt.   . CEREBROVASCULAR DISEASE 03/03/2010  . Diverticulitis   . DIVERTICULOSIS, COLON 03/03/2010  . Fatty liver 08/02/09   as per U/S done by [facility]  . Foramen ovale    positive bubble study  . GERD (gastroesophageal reflux disease)   . Headache(784.0)    she thinks sinus headaches  . History of cerebral artery stenosis    right middle  . HYPERLIPIDEMIA 03/03/2010  . Hypertension   . HYPOTHYROIDISM 03/03/2010   no longer on meds  . Internal hemorrhoid   . Low back pain   . Mastoiditis    noted on brain MRI  . Rib fracture   . Right leg pain   . Stroke [facility] Oct. 2011   TIA  . Ulcer     Past Surgical History:  Procedure Laterality Date  . BREAST LUMPECTOMY     right  . CATARACT EXTRACTION Left    10/2016   Rt 04/2017  . CHOLECYSTECTOMY    . COLONOSCOPY    . POLYPECTOMY     benign cecum  . SHOULDER SURGERY     right shoulder (dislocation)  .  TONSILLECTOMY    . TUBAL LIGATION    . UPPER GASTROINTESTINAL ENDOSCOPY    . VIDEO BRONCHOSCOPY WITH ENDOBRONCHIAL ULTRASOUND N/A 01/07/2014   Procedure: VIDEO BRONCHOSCOPY WITH ENDOBRONCHIAL ULTRASOUND;  Surgeon: Ivin Poot, MD;  Location: [place];  Service: Thoracic;  Laterality: N/A;    Social History:  reports that she quit smoking about 27 years ago. Her smoking use included cigarettes. She has a 10.00 pack-year smoking history. She has never used smokeless tobacco. She reports that she does not drink alcohol or use drugs.  Allergies: Allergies  Allergen Reactions  . Ciprofloxacin Anaphylaxis    Family History:  Family History  Problem Relation Age of Onset  . Heart disease Sister        cerebral vascular disease also  . Heart disease  Brother        cerebral vascular disease also  . Heart disease Brother        cerebral vascular disease  . Heart disease Sister        cebreal vascular disease also  . Heart disease Sister        cebreal vascular diease also  . Heart disease Sister        cerebral vascular diease also  . Stomach cancer Father   . Colon cancer Neg Hx   . Esophageal cancer Neg Hx   . Rectal cancer Neg Hx   . Colon polyps Neg Hx   . Asthma Neg Hx      Current Outpatient Medications:  .  acetaminophen (TYLENOL) 325 MG tablet, Take 650 mg by mouth daily as needed for headache (toothpain)., Disp: , Rfl:  .  ALPRAZolam (XANAX) 0.25 MG tablet, Take 1 tablet (0.25 mg total) by mouth 3 (three) times daily as needed for anxiety., Disp: 10 tablet, Rfl: 0 .  aspirin EC 81 MG tablet, Take 81 mg by mouth daily after breakfast. , Disp: , Rfl:  .  carvedilol (COREG) 12.5 MG tablet, Take 1 tablet (12.5 mg total) by mouth 2 (two) times daily with a meal., Disp: 60 tablet, Rfl: 2 .  Cholecalciferol (VITAMIN D PO), Take 1,000 Units by mouth 2 (two) times daily., Disp: , Rfl:  .  docusate sodium (COLACE) 100 MG capsule, Take 100 mg by mouth daily as needed for mild constipation., Disp: , Rfl:  .  Glucosamine 500 MG CAPS, Take 500 mg by mouth daily. , Disp: , Rfl:  .  loratadine (CLARITIN) 10 MG tablet, Take 1 tablet (10 mg total) by mouth daily., Disp: 30 tablet, Rfl: 11 .  losartan (COZAAR) 25 MG tablet, Take 0.5 tablets (12.5 mg total) by mouth daily., Disp: 30 tablet, Rfl: 2 .  OVER THE COUNTER MEDICATION, [place] for gas, takes twice daily, Disp: , Rfl:  .  Probiotic Product (PROBIOTIC DAILY PO), Take 1 tablet by mouth daily. , Disp: , Rfl:  .  spironolactone (ALDACTONE) 25 MG tablet, Take 0.5 tablets (12.5 mg total) by mouth daily., Disp: 30 tablet, Rfl: 2  Review of Systems:  Constitutional: Denies fever, chills, diaphoresis. HEENT: Denies photophobia, eye pain, redness, hearing loss, ear pain, congestion, sore  throat, sneezing, mouth sores, trouble swallowing, neck pain, neck stiffness and tinnitus.   Respiratory: Denies  cough, chest tightness,  and wheezing.   Cardiovascular: Denies chest pain, palpitations and leg swelling.  Gastrointestinal: Denies  vomiting, abdominal pain, diarrhea, constipation, blood in stool and abdominal distention.  Genitourinary: Denies dysuria, urgency, frequency, hematuria, flank pain and difficulty urinating.  Endocrine: Denies:  hot or cold intolerance, sweats, changes in hair or nails, polyuria, polydipsia. Musculoskeletal: Denies myalgias, back pain, joint swelling, arthralgias and gait problem.  Skin: Denies pallor, rash and wound.  Neurological: Denies dizziness, seizures, syncope, weakness, light-headedness, numbness and headaches.  Hematological: Denies adenopathy. Easy bruising, personal or family bleeding history  Psychiatric/Behavioral: Denies suicidal ideation, mood changes, confusion, nervousness, sleep disturbance and agitation    Physical Exam: Vitals:   03/13/19 0723  BP: 108/64  Pulse: 85  Temp: 97.9 F (36.6 C)  TempSrc: Temporal  SpO2: 98%  Weight: 131 lb 1.6 oz (59.5 kg)    Body mass index is 23.98 kg/m.   Constitutional: NAD, calm, comfortable Eyes: PERRL, lids and conjunctivae normal ENMT: Mucous membranes are moist.  Respiratory: clear to auscultation bilaterally, no wheezing, no crackles. Normal respiratory effort. No accessory muscle use.  Cardiovascular: Regular rate and rhythm, no murmurs / rubs / gallops. No extremity edema. 2+ pedal pulses.  Abdomen: no tenderness, no masses palpated. No hepatosplenomegaly. Bowel sounds positive.  Musculoskeletal: no clubbing / cyanosis. No joint deformity upper and lower extremities. Good ROM, no contractures. Normal muscle tone.  Skin: no rashes, lesions, ulcers. No induration Neurologic: Grossly intact and nonfocal although generally weak Psychiatric: Normal judgment and insight. Alert and  oriented x 3.  Mood is tearful.   Impression and Plan:  Chronic combined systolic and diastolic CHF (congestive heart failure) ([place]) -New diagnosis made during this hospitalization. -Echo with ejection fraction of 30 to 35%. -Continue aspirin, ARB, beta-blocker, spironolactone. -She has been experiencing some dizziness and nausea, which may be related to low blood pressures as she is not due to taking this medication.  Her blood pressure is low normal today and she is orthostatic, feel okay continuing these medications at this time.  Zofran ODT as needed.  Bereavement -Her husband passed away not quite 2 weeks ago. -[facility] is actively following and will send out a Education officer, museum per their last note.  Malignant neoplasm of upper-outer quadrant of right breast in female, estrogen receptor positive ([place]) -Has an appointment scheduled with Dr. Jana Hakim. -She is almost sure that she does not want to continue treatment and is wondering who can request hospice for her if she so decides.  Have advised that either myself or her oncologist can trigger hospice.  Support given, she will contact us with her decision.  Chronic rhinitis -Continue loratadine.  Anxiety -Continue as needed alprazolam.    Patient Instructions  -Nice seeing you today!!  -Schedule follow up in 3 months.     Lelon Frohlich, MD [facility]

## 2019-03-15 ENCOUNTER — Other Ambulatory Visit: Payer: Self-pay | Admitting: Oncology

## 2019-03-16 ENCOUNTER — Other Ambulatory Visit: Payer: Self-pay | Admitting: *Deleted

## 2019-03-16 ENCOUNTER — Encounter: Payer: Self-pay | Admitting: *Deleted

## 2019-03-16 ENCOUNTER — Other Ambulatory Visit: Payer: Self-pay

## 2019-03-16 NOTE — Patient Outreach (Signed)
[place] [facility]) Care Management  03/16/2019  Erica Young 01-30-1941 [id number]   Telephone Assessment/Screen  Referral Date:02/27/19 Referral Source:THN PAC Liaison, A Hall Referral Reason:dx of Heart failure and cancer with mets,HTN, CVA, anxiety, FTT Please assign to [facility] RN CM for care coordination and complex case management and [facility] Social Worker for meal assistance. Unable to reach member on mobile. Remains in SNF. Spoke with son Erica Young at [phone] about [facility] follow up. Member to dc from [facility] on 02/27/19 with [facility]. Will be home alone with family checking in. Followed closely by [place] link for ongoing collaboration. Inquired about outpatient palliative care services. Please see writer's notes from 10/29 for further details. Please call with questions. Thanks. Marthenia Rolling, MSN-Ed, RN,BSN-THN Post Acute Care Coordinator-[phone]  AnneCunninghamis the [facility] SW who is involved in Erica. Young's care. [place] Medicare supplement  Week 3 Call out reach to Erica Young HIPAA verified  [facility] RN CM spoke at a slow pace for Erica Young  Erica Young reports she is doing fair today 03/16/19 She reports not sleeping well on the night of 03/15/19. She reports napping during the day for about 2 hours. She had sob and chest pain during the night. She continues to take her spironolactone She denies weighing nor checking her BP. She reports her husband has a BP cuff at the home but he had difficulty with the BP cuff providing accurate readings. She reports not being able to use the BP cuff and "don't want to use it." [facility] RN CM discussed the importance of checking the BP prior to taking HTN medicine to prevent dizziness, falls, etc and taking HTN medication only when her BP is    WNL. She reports her last wt she recalls was 131 lbs. THN RN CM discussed sodium and fluid intake. She reports liking  and frequent intake of apple juice with her pills. She reports if she has too much apple juice she has increased stools. She shares an episode of having 3 stools before 9 am. She reports a hx of constipation and diverticulosis. THN RN CM discussed low fiber diet and sent EMMI education on low fiber diet, dealing with low blood pressure from the drugs you take, low salt diet and low blood pressure discharge instructions via mail. She does not prefer using computer.   HTN/questionable s/s of hypotension- Erica Charette confirms she continues to be followed by the [place] RN to check her BP She reports the [facility] RN will visit on 03/17/19. Erica Young reports dizziness "in the mornings"  Meal assistance- Erica Lisenby confirms she does not cook and is in need of meal assistance. She reports her husband had spoken with a lady at their church about cooking meals. Erica Young reports this lady arrived to bring 2-3 days of food on 03/16/19 but Erica Weimer reports she is not preferring to use the lady's services in the future related to cost.  [facility] RN CM discussed wheels on wheels program but Erica Young does not prefer services from Meals on wheels. Erica Young likes chicken, fish (salmon)and vegetables. THN SW referral for meal assistance and prefers not to use meals on wheels ordered  Grief counseling Erica Young confirms she received a call from [place] but voiced some confusion on when she would receive another call. She informed CM she would receive a call on 03/17/19 but per Epic she is scheduled to be called by [facility] SW  on 03/23/19. [facility] RN CM updated her that she would be contacted on 03/23/19 She voiced understanding   Social Erica Young is a 78 year old retired female who lives at home alone with family checking in on her. Her husband Erica Young passes on 03/18/19 at [place] [facility] after she was d/c home from [place] [facility] on 02/27/19. She has supportive family and friends. Her son Erica Young is supportive. She is  able to complete ADLs and needs assist with iADLs and transportation from her son and family. She does understand and speak english but requests english to be spoken slower so she can understand clearly.     Conditions: Right Upper quadrant Breast cancer stage 4-estrogen receptor positive, bone mets, HTN, Cerebrovascular disease, hilar adenopathy, aortic atherosclerosis, hx of acute systolic CHF, hx of syncope, chronic rhinitis, colon diverticulosis, HLD, back pain, abdominal pain, hx of epistaxis, acute left sided weakness, anxiety, hx of sob, leukopenia due to antineoplastic chemo  Future Appointments 03/25/19 1200 oncology lab 1230 visit with Dr Jana Hakim oncologist 1315 medonc flush 03/27/19 oncology medonc flush and labs 05/07/2019 0900 cardiology Dr Debara Pickett 3 month f/u   [facility] RN CM will follow up with Erica Young within the next 7-14 days for further assessment of needs   Routed note to MD/Np/PA  [facility] CM Care Plan Problem One     Most Recent Value  Care Plan Problem One  Breast cancer wtih bone mets home care needs  Role Documenting the Problem One  Care Management Telephonic Coordinator  Care Plan for Problem One  Active  [facility] Long Term Goal   over the next 60 days patient will receive knowledge of services and resources for home care of breast cancer with bone mets  THN Long Term Goal Start Date  03/10/19  Interventions for Problem One Long Term Goal  assessed need for DME/services, [facility] SW referral for meal assistance other than meals on wheels  THN CM Short Term Goal #1   over the next 30 days patient will be offered [facility] SW if she chooses for grief related to the loss of her husband  [facility] CM Short Term Goal #1 Start Date  03/11/19  [facility] CM Short Term Goal #1 Met Date  03/16/19      [name], RN, BSN, [place] Coordinator Office number [phone] Mobile number [phone]  Main THN number [phone] Fax number [phone]

## 2019-03-16 NOTE — Patient Outreach (Signed)
[place] [facility] [facility]) Care Management  03/16/2019  Erica Young 10-Oct-1940 [id number]   CSW was able to make initial contact with patient today to perform phone assessment, as well as assess and assist with social work needs and services.  CSW introduced self, explained role and types of services provided through [place] [employer] ([place] [facility]).  CSW further explained to patient that CSW works with patient's RNCM, also with [place] [facility], Erica Young.  CSW then explained the reason for the call, indicating that Mrs. Erica Young thought that patient would benefit from social work services and resources to assist with grief and loss counseling for recent loss of husband, Erica Young and depressive symptoms associated with this loss.  CSW obtained two HIPAA compliant identifiers from patient, which included patient's name and date of birth.  According to patient, Erica Young passed away at [facility] where he was residing to receive short-term rehabilitative services, on Monday, March 02, 2019.  Patient admitted that she feels somewhat responsible for Erica Young death, having left [facility], just three days prior.  CSW provided counseling and supportive services to patient, helping her understand that she is not to blame for Erica Young death, nor should she feel guilty about having to leave Erica Young at the facility to continue rehabilitative services.  Patient reported experiencing moments of sadness and having crying spells.  CSW explained to patient that this is all just a normal part of the grieving process.  Patient denies feeling homicidal or suicidal at present.  Patient indicated that her family is very supportive of her, especially her son, Erica Young, who lives close-by and checks in with her on a daily basis.  Patient has another child but reported that they live  out-of-state, rarely having an opportunity to see them or converse with them.  CSW offered to provide patient with free, short-term telephonic counseling and supportive services, at least until CSW is able to get patient established with a long-term community therapist, or until COVID-19 restrictions are lifted.  Patient admitted that she is not interested in seeing a therapist in the community, not wanting to have to rely on family members for transportation, or worry about having to make a co-payment.  Patient is agreeable to receiving counseling services through CSW, requesting that we begin these services next week.    CSW agreed to follow-up with patient again next week, on Monday, March 23, 2019, around 9:00AM, per patient's request, to begin providing counseling services.  In the meantime, CSW will mail patient the following list of EMMI information for her review:  Coping with a Health Condition:  Signs of Depression; Depression; Depression:  Medications; Depression:  Other Things You Can Do; Dealing with Death, Adult.  CSW will also mail patient the following list of community agencies and resources:  Grief and Loss Counseling Services, Offered through [employer]; List of Counselors/Therapists in the [employer], Accepting [employer] Medicare; Local Grief and Loss Support Groups and On-Line Chat Groups for Spouses of the Young.  Patient voiced understanding and was agreeable to this plan.  Erica Young, BSW, MSW, LCSW  Licensed Education officer, environmental Health System  Mailing [place] [address] [place], [place] [address] Physical Address-[address], [place], [place] [address] Toll Free Main # [phone] Fax # [phone] Cell # [phone]  Office # [phone] [email or web address]

## 2019-03-17 ENCOUNTER — Encounter: Payer: Self-pay | Admitting: *Deleted

## 2019-03-17 ENCOUNTER — Other Ambulatory Visit: Payer: Self-pay | Admitting: *Deleted

## 2019-03-17 NOTE — Patient Outreach (Signed)
[place] [facility]) Care Management  03/17/2019  Erica Young 1941-01-31 [id number]   CSW received a request from patient's RNCM, Jackelyn Poling, also with [place] Management, to provide patient with a list of food services and resources.  [name] reported that patient is not interested in receiving Mobile Meals through [employer].  CSW agreed to mail patient information about the [employer] Well Dine Program, The [place] in [place], as well as The [place] in [place].  [name] thought that this information would be sufficient, reporting that patient is also receiving meals through family members and friends.  All resource information will go out in the mail to patient today.  CSW will make contact with patient next week to ensure that she received the information, as well as answer any questions that she may have at that time.  [name], BSW, MSW, LCSW  Licensed Education officer, environmental Health System  Mailing [place] [address]. [address], [place], [place] [address] Physical Address-[address], [place],  [address] Toll Free Main # [phone] Fax # [phone] Cell # [phone]  Office # [phone] [email or web address]

## 2019-03-20 ENCOUNTER — Other Ambulatory Visit: Payer: Self-pay

## 2019-03-23 ENCOUNTER — Other Ambulatory Visit: Payer: Self-pay | Admitting: *Deleted

## 2019-03-23 ENCOUNTER — Other Ambulatory Visit: Payer: Self-pay

## 2019-03-23 NOTE — Patient Outreach (Addendum)
[facility]) Care Management  03/23/2019  Erica Young 06-May-1940 [id number]   F/u Telephone Assessment/Screen-complex   Referral Date:02/27/19 Referral Source:THN PAC Liaison, A Hall Referral Reason:dx of Heart failure and cancer with mets,HTN,CVA, anxiety, FTT Please assign to [facility] CMfor care coordination and complex case management and [facility] Social Worker for meal assistance. Member to dc from [facility] with [facility]. Will be home alone with family checking in. Followed closely by [place] is involved in Erica. Young's care. [place] Medicare supplement  Week 4 Call out reach to Erica Young HIPAA verified  [facility] RN CM spoke at a slow pace for Erica Young   Today 03/23/19 Erica Young states she is having sinus issues THN RN CM assessed for covid s/s, no positive s/s per pt She reports she has been treated for sinus issues by primary care MD previously Today she reports resting in her bed and having a nare "closed" She reports taking loratadine as ordered.  She informs [facility] RN CM that her sister has come to stay with her for a "few weeks" She reports she, her sister and family are originally from [place], [place]. She states her sister use to live in [place] but had been in [place] for years. Her sister has "five children" and has agree to stay with Erica Young for a while. She reports her sister likes to cook and Erica Young is getting more meals. She states her sister has been at her home to talk to her, keep her company and to "console me" She voiced interest in possible further socialization resources after her sister leaves. She and her sister plan to spend the Thanksgiving holiday at her son's home. Continues to receive grief counseling/resources from [place] RN CM discussed the call attempt by [facility] SW today. Erica Young reports  she may attempt to return the call. She confirms again she does not want meals on wheels assistance, had meals from a lady at church and now her sister is assisting with meals. THN RN Cm reminded her that CM completed the [facility] SW referral to include that her preference is not for meals on wheels. Erica Young states she has received meal assistance information for Mom's meals from [place]. She states she will attempt to try Mom's meals after her sister lives.   SocialMrs Terrion Young is a 78 year old retired female who lives at home alone with family checking in on her. Her husband Sabreen Kitchen passes on 04-01-2019 at [place] place after she was d/c home from [place] place on 02/27/19. She has supportive family and friends. Her son [name] is supportive. She is able to complete ADLs and needs assist with iADLs and transportation from her son and family. She does understand and speak english but requests english to be spoken slower so she can understand clearly.     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  1200 oncology lab 1230 visitwithDr magrinat oncologist 1315 medonc flush 03/27/19 oncology medonc flush and  labs 05/07/2019 0900 cardiology Dr Debara Pickett 3 month f/u  [facility] RN CM will follow up with Erica Young within the next 14-21 days as she agree for follow up after her oncology f/u Pt encouraged to return a call to [facility] RN CM prn  [facility] CM Care Plan Problem One     Most Recent Value  Care Plan Problem One   Breast cancer wtih bone mets home care needs  Role Documenting the Problem One  Care Management Telephonic Coordinator  Care Plan for Problem One  Active  [facility] Long Term Goal   over the next 60 days patient will receive knowledge of services and resources for home care of breast cancer with bone mets  THN Long Term Goal Start Date  03/10/19  Interventions for Problem One Long Term Goal  assessed health, reviewed upcoming 03/25/19 oncology appointment, assessed for transportation to appointment  [facility] CM Short Term Goal #1   over the next 30 days patient will be offered [facility] SW if she chooses for grief related to the loss of her husband  [facility] CM Short Term Goal #1 Start Date  03/11/19  [facility] CM Short Term Goal #1 Met Date  03/16/19      [name], RN, BSN, [place] Coordinator Office number [phone] Mobile number [phone]  Main THN number [phone] Fax number [phone]

## 2019-03-23 NOTE — Patient Outreach (Signed)
[facility] [facility]) Care Management  03/23/2019  Erica Young 1940/06/10 [id number]   CSW made an attempt to try and contact patient today to follow-up regarding social work services and resources, as well as to begin providing counseling and supportive services for symptoms of depression and grief; however, patient was unavailable at the time of CSW's call.  CSW tried calling patient on her home number, but her phone just continued to ring and ring, with no answer.  In addition, CSW tried calling patient on her cell phone number, but received an automated recording that patient was unavailable and CSW was unable to leave a HIPAA compliant message on voicemail.  CSW would typically make a second outreach attempt within the next 3-4 business days; however, due to the Thanksgiving holiday and the office being closed, CSW will outreach to patient again on Tuesday, March 31, 2019, around 9:00am, if a return call is not received from patient in the meantime.  [name], BSW, MSW, LCSW  Licensed Education officer, environmental Health System  Mailing [place] [address], [place], [place] [address] Physical Address-[address], [place], [place] [address] Toll Free Main # [phone] Fax # [phone] Cell # [phone]  Office # [phone] [email or web address]

## 2019-03-24 ENCOUNTER — Other Ambulatory Visit: Payer: Self-pay | Admitting: *Deleted

## 2019-03-24 ENCOUNTER — Telehealth: Payer: Self-pay | Admitting: *Deleted

## 2019-03-24 NOTE — Patient Outreach (Signed)
[facility]) Care Management  03/24/2019  Erica Young 1940-10-18 [id number]   Care coordination- home health   Patient called [facility] RN CM to voice concern with pending Home health services  Patient is able to verify HIPAA, DOB and Address Consent: [facility] RN CM reviewed [facility] services with patient. Patient gave verbal consent for [facility] telephonic RN CM services.   Erica Young voiced concern about awaiting a visit from [facility] staff today without anyone showing up today  [facility] RN CM completed conference calls with Erica Young to [facility] x 2 and left messages for return calls to [facility] RN CM and/or Erica Young Erica Young had the number of a previous visiting [facility] RN.  THN RN CM completed a conference call to [place] at [phone] with Erica Young.  Erica Young was reminded that it had been explained to her before that this [facility] RN did her initial home assessment and [name] was pending approval from her insurance coverage for further visits. On the last call this [facility] RN made to Erica Young she had informed her that her next visit would be on 03/31/19 not 03/24/19  On today [place] also reviewed with Erica Young that she would be contacted on 03/30/19 by the [facility] staff person to scheduled the best time for her to be seen on 03/31/19 Erica Young ventilated her feelings and then voiced understanding that she would be seen on 03/31/19. Erica Young voiced to [facility] RN CM that it was an inconvenience for her to wait for home health staff and they never arrive.   [facility] RN CM called to Dr Jerilee Hoh office at [phone]  Spoke with [name] who sent a message to Dr Jerilee Hoh and her RN [facility] RN CM discussed with [name] that during the last 2 contacts with Erica Young, Erica Young voiced concerns with "sinus" and sob. This patient also reports to [facility] RN CM that she is wanting to see family for the Thanksgiving holiday and [facility] RN CM cautioned her about this as she is an oncology patient with respiratory  s/s. Erica Young informs [facility] RN CM "God will protect me"  She had previously been encouraged on 03/16/19 to call Dr Jerilee Hoh to discuss these s/s and to possibly request medications to be called in to her local pharmacy (like a different sinus medication or an inhaler). Erica Young did not call her MD and on today,03/24/19, she tells [facility] RN CM that she thought [facility] RN CM would call the MD for her to inquire about an inhaler.   [facility] RN CM will follow up with Erica Young within the next 14-21 days as she agree for follow up after her oncology f/u Pt encouraged to return a call to [facility] RN CM prn Reminded her of the 24 hour nurse call center and her primary care MD office  [facility] CM Care Plan Problem One     Most Recent Value  Care Plan Problem One  Breast cancer wtih bone mets home care needs  Role Documenting the Problem One  Care Management Telephonic Coordinator  Care Plan for Problem One  Active  THN Long Term Goal   over the next 60 days patient will receive knowledge of services and resources for home care of breast cancer with bone mets  THN Long Term Goal Start Date  03/10/19  Interventions for Problem One Long Term Goal  assessed sob and sinus concerns, discussed the risks of being in the company of others with respiratory s/s,  discuss her contacting her MD, Called her MD and left a message, encouraged her to call her MD or 24 hour nurse call center prn called with pt to her home health staff to clarify the next scheduled home health visit   THN CM Short Term Goal #1   over the next 30 days patient will be offered [facility] SW if she chooses for grief related to the loss of her husband  [facility] CM Short Term Goal #1 Start Date  03/11/19  [facility] CM Short Term Goal #1 Met Date  03/16/19      [name], RN, BSN, [place] Coordinator Office number [phone] Mobile number [phone]  Main THN number [phone] Fax number [phone]

## 2019-03-24 NOTE — Telephone Encounter (Signed)
She will need a virtual visit to determine next steps.

## 2019-03-24 NOTE — Progress Notes (Signed)
[place]  Telephone:(336) [phone] Fax:(336) [phone]     ID: Erica Young DOB: [birth year]  MR#: [id number]  TWS#:[id number]  Patient Care Team: Isaac Bliss, Rayford Halsted, MD as PCP - General (Internal Medicine) Debara Pickett Nadean Corwin, MD as PCP - Cardiology (Cardiology) Tanda Rockers, MD as Consulting Physician (Pulmonary Disease) Eriberto Felch, Erica Dad, MD as Consulting Physician (Oncology) Armbruster, Carlota Raspberry, MD as Consulting Physician (Gastroenterology) Bobbitt, Sedalia Muta, MD as Consulting Physician (Allergy and Immunology) Barbaraann Faster, RN as [place] Management [place], LCSW as [place] Management OTHER MD:   CHIEF COMPLAINT: 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  Since her last visit here she had a brief admission then went to skilled nursing facility for rehab.  To recap: She received her fulvestrant on 01/29/2019 and with the second dose that day she had a syncopal episode.  She experienced left-sided weakness and dysarthria and evaluation showed no evidence of acute stroke by MRI.  She had the same enhancing bone lesion in the right posterior parietal bone noted in 2016.  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  She was discharged 04/06/2019 to rehab facility.  Meanwhile her husband Erica Young was found to have progression of his cancer to the brain and he died approximately 2 weeks ago.  Erica Young tells me her sister came to visit and is currently in town.  She has a son in [place] who is very attentive and another son from [place] also came to visit for the funeral.  Erica Young was being treated  with fulvestrant with a dose due today.  However she says she is afraid to receive it again since she had that syncopal episode while receiving the medication.  She also is not sure she wants any further treatment.  She wonders if she will simply die if she stops treatment and she is agreeable to that she says.  Her palbociclib also has been on hold since 01/29/2019  REVIEW OF SYSTEMS: Erica Young is living in the townhouse that she and her husband Erica Young had bought for them to live" until we died".  She is now there by herself and this is very difficult for her.  She is terrified of any noise that may happen.  She does not know if she wants to stay there or move somewhere else but she does not know where she would move.  Currently she denies any unusual headaches visual changes cough or phlegm production.  She tells me she needs extensive dental work.  She does not have any cough fever or diarrhea.  She continues to have a variety of mild GI discomforts which are not constant or progressive.  A detailed review of systems was otherwise stable.    BREAST CANCER HISTORY: From the earlier summary note  Erica Young tells me in 2007 while living in [place] she was found to have a very small cancer in the upper-outer quadrant of the right breast, about the size of the P, noted on mammography. It was not palpable. She had a right lumpectomy and full sentinel lymph node sampling. She then received adjuvant radiation, to a total of 33 treatments. She received no systemic therapy.  She then did  well until December 2014, when she had a fall and complain of pain in her left ribs. Rib films and chest x-ray on 04/13/2013 showed no fractures, but CT of the abdomen and pelvis on the same day found subcarinal and right hilar adenopathy. This was followed up with a chest CT scan 05/05/2013 confirming extensive mediastinal and right hilar lymphadenopathy, with multiple pulmonary nodules, the largest measuring 0.9 cm. PET scan  05/21/2013 showed hypermetabolic adenopathy in the right and left paratracheal areas, the precarinal, subcarinal and right hilar areas, but no involvement of the liver, and the lung nodules were not hypermetabolic (although they were below the level of reliable detection). In addition, at L5 there was a lucency measuring 1.2 cm.  The PET scan also showed a hypermetabolic focus on the thyroid gland, which was evaluated with neck ultrasound and biopsy 91/63/8466, showing a follicular lesion of undetermined significance (([id number]).  The patient was referred to pulmonary [Dr Melvyn Novas and Dr Julien Nordmann for further evaluation. Repeat PET scan 12/28/2013 showed, in addition to the adenopathy previously noted, now multiple bony metastases. On 01/07/2014 the patient underwent bronchoscopy and this showed ([id number], together with separate cytology] a low-grade mucinous invasive breast cancer, estrogen receptor 100% positive, progesterone receptor 14% positive, with no HER-2 amplification, the signals ratio being 1.30 and the number per cell 1.95.  The patient was started on anastrozole 01/22/2014; monthly denosumab/Xgeva was added 07/30/2014. She appeared to tolerate this well, and her CA-27-29 (127 at baseline) normalized. Most recent CT scans of chest abdomen and pelvis 10/03/2015 showed continuing response. Despite this good news however, the patient decided to go off anastrozole and denosumab/Xgeva as of June 2017. By the time she saw Dr. Earlie Server again in September 2017 her tumor marker had doubled. At that time the patient was referred to the breast clinic for a second opinion regarding further evaluation and treatment.   PAST MEDICAL HISTORY: Past Medical History:  Diagnosis Date   Allergy    Anxiety    Bone cancer ([place]) mri 11/11/14   right parietal bone,left cribriform plate metastases   Cancer ([place]) 2007   right breat ca  , lumpectomy and radiation tx (declined chemo and additional prophylactic  meds)   Cataract    both eyes  10/2016 Lt.     04/2017 Rt.    CEREBROVASCULAR DISEASE 03/03/2010   Diverticulitis    DIVERTICULOSIS, COLON 03/03/2010   Fatty liver 08/02/09   as per U/S done by [facility]   Foramen ovale    positive bubble study   GERD (gastroesophageal reflux disease)    Headache(784.0)    she thinks sinus headaches   History of cerebral artery stenosis    right middle   HYPERLIPIDEMIA 03/03/2010   Hypertension    HYPOTHYROIDISM 03/03/2010   no longer on meds   Internal hemorrhoid    Low back pain    Mastoiditis    noted on brain MRI   Rib fracture    Right leg pain    Stroke [facility]) Oct. 2011   TIA   Ulcer     PAST SURGICAL HISTORY: Past Surgical History:  Procedure Laterality Date   BREAST LUMPECTOMY     right   CATARACT EXTRACTION Left    10/2016   Rt 04/2017   CHOLECYSTECTOMY     COLONOSCOPY     POLYPECTOMY     benign cecum   SHOULDER SURGERY     right shoulder (dislocation)   TONSILLECTOMY  TUBAL LIGATION     UPPER GASTROINTESTINAL ENDOSCOPY     VIDEO BRONCHOSCOPY WITH ENDOBRONCHIAL ULTRASOUND N/A 01/07/2014   Procedure: VIDEO BRONCHOSCOPY WITH ENDOBRONCHIAL ULTRASOUND;  Surgeon: Ivin Poot, MD;  Location: [facility];  Service: Thoracic;  Laterality: N/A;    FAMILY HISTORY Family History  Problem Relation Age of Onset   Heart disease Sister        cerebral vascular disease also   Heart disease Brother        cerebral vascular disease also   Heart disease Brother        cerebral vascular disease   Heart disease Sister        cebreal vascular disease also   Heart disease Sister        cebreal vascular diease also   Heart disease Sister        cerebral vascular diease also   Stomach cancer Father    Colon cancer Neg Hx    Esophageal cancer Neg Hx    Rectal cancer Neg Hx    Colon polyps Neg Hx    Asthma Neg Hx   The patient's father died from stomach cancer in his late 68s. The  patient's mother died in her 47s from a stroke. The patient has 2 brothers, 4 sisters. One sister had leukemia. There is no history of breast, colon, or ovarian cancer in the family to her knowledge   GYNECOLOGIC HISTORY:  No LMP recorded. Patient is postmenopausal. Menarche age 89, first live birth age 19, the patient is Erica Young. She went through menopause in her late 1s. She took no hormone replacement. She took oral contraceptives for approximately 2 years remotely without complications.   SOCIAL HISTORY: (updated 03/2019) Erica Young is originally from [place], [place]. She worked as a Education officer, museum, particularly, in the field of substance abuse. Her husband, Erica Young, was a retired substance abuse Social worker. He unfortunately passed away in late October/early November 2020. Erica Young has 2 children from her first marriage, Erica Young, who lives in [place], and worked as a Audiological scientist but is now disabled, and Erica Young, who lives in [place] and works in Engineer, mining. Erica Young has 3 children from his first marriage, Erica Young, Erica Young, and Erica Young. Erica Young lives in [place] and has his own trucking business. Mikki Santee tells me he is estranged from his 2 daughters Erica Young (a retired Pharmacist, hospital) and Erica Young (who works in a bank). They are living on [place].The patient has 8 grandchildren. Lawther attends [employer].   ADVANCED DIRECTIVES:    HEALTH MAINTENANCE: Social History   Tobacco Use   Smoking status: Former Smoker    Packs/day: 0.50    Years: 20.00    Pack years: 10.00    Types: Cigarettes    Quit date: 05/01/1991    Years since quitting: 27.9   Smokeless tobacco: Never Used  Substance Use Topics   Alcohol use: No    Alcohol/week: 0.0 standard drinks   Drug use: No     Colonoscopy:  PAP:  Bone density:   Allergies  Allergen Reactions   Ciprofloxacin Anaphylaxis    Current Outpatient Medications  Medication Sig Dispense Refill   ALPRAZolam  (XANAX) 0.25 MG tablet Take 1 tablet (0.25 mg total) by mouth 3 (three) times daily as needed for anxiety. 10 tablet 0   aspirin EC 81 MG tablet Take 81 mg by mouth daily after breakfast.      carvedilol (COREG) 12.5 MG tablet Take 1 tablet (  12.5 mg total) by mouth 2 (two) times daily with a meal. 60 tablet 2   Cholecalciferol (VITAMIN D PO) Take 1,000 Units by mouth 2 (two) times daily.     Glucosamine 500 MG CAPS Take 500 mg by mouth daily.      loratadine (CLARITIN) 10 MG tablet Take 1 tablet (10 mg total) by mouth daily. 30 tablet 11   losartan (COZAAR) 25 MG tablet Take 0.5 tablets (12.5 mg total) by mouth daily. 30 tablet 2   ondansetron (ZOFRAN ODT) 4 MG disintegrating tablet Take 1 tablet (4 mg total) by mouth every 8 (eight) hours as needed for nausea or vomiting. 20 tablet 0   OVER THE COUNTER MEDICATION [place] for gas, takes twice daily     Probiotic Product (PROBIOTIC DAILY PO) Take 1 tablet by mouth daily.      spironolactone (ALDACTONE) 25 MG tablet Take 0.5 tablets (12.5 mg total) by mouth daily. 30 tablet 2   No current facility-administered medications for this visit.     OBJECTIVE: Middle-aged Latin American woman who appears stated age  58:   03/25/19 1213  BP: 128/72  Pulse: 70  Resp: 17  Temp: 98 F (36.7 C)  SpO2: 99%     Body mass index is 23.58 kg/m.    ECOG FS:1 - Symptomatic but completely ambulatory  Sclerae unicteric, EOMs intact Wearing a mask No cervical or supraclavicular adenopathy Lungs no rales or rhonchi Heart regular rate and rhythm Abd soft, nontender, positive bowel sounds MSK no focal spinal tenderness Neuro: nonfocal, well oriented, anxious but appropriate affect Breasts: Deferred  LAB RESULTS:  CMP     Component Value Date/Time   NA 137 02/04/2019 0330   NA 138 04/18/2017 0853   K 4.2 02/04/2019 0330   K 4.4 04/18/2017 0853   CL 103 02/04/2019 0330   CO2 25 02/04/2019 0330   CO2 29 04/18/2017 0853   GLUCOSE 112 (H)  02/04/2019 0330   GLUCOSE 95 04/18/2017 0853   BUN 17 02/04/2019 0330   BUN 16.6 04/18/2017 0853   CREATININE 0.82 02/04/2019 0330   CREATININE 0.77 01/29/2019 0855   CREATININE 0.8 04/18/2017 0853   CALCIUM 8.7 (L) 02/04/2019 0330   CALCIUM 9.2 04/18/2017 0853   PROT 7.1 01/29/2019 1048   PROT 7.0 04/18/2017 0853   ALBUMIN 4.0 01/29/2019 1048   ALBUMIN 3.8 04/18/2017 0853   AST 28 01/29/2019 1048   AST 16 01/29/2019 0855   AST 17 04/18/2017 0853   ALT 16 01/29/2019 1048   ALT 13 01/29/2019 0855   ALT 16 04/18/2017 0853   ALKPHOS 73 01/29/2019 1048   ALKPHOS 90 04/18/2017 0853   BILITOT 0.6 01/29/2019 1048   BILITOT 0.3 01/29/2019 0855   BILITOT 0.63 04/18/2017 0853   GFRNONAA >60 02/04/2019 0330   GFRNONAA >60 01/29/2019 0855   GFRAA >60 02/04/2019 0330   GFRAA >60 01/29/2019 0855    INo results found for: SPEP, UPEP  Lab Results  Component Value Date   WBC 3.0 (L) 02/04/2019   NEUTROABS 1.3 (L) 02/02/2019   HGB 12.6 02/04/2019   HCT 35.3 (L) 02/04/2019   MCV 110.0 (H) 02/04/2019   PLT 237 02/04/2019      Chemistry      Component Value Date/Time   NA 137 02/04/2019 0330   NA 138 04/18/2017 0853   K 4.2 02/04/2019 0330   K 4.4 04/18/2017 0853   CL 103 02/04/2019 0330   CO2 25 02/04/2019 0330  CO2 29 04/18/2017 0853   BUN 17 02/04/2019 0330   BUN 16.6 04/18/2017 0853   CREATININE 0.82 02/04/2019 0330   CREATININE 0.77 01/29/2019 0855   CREATININE 0.8 04/18/2017 0853      Component Value Date/Time   CALCIUM 8.7 (L) 02/04/2019 0330   CALCIUM 9.2 04/18/2017 0853   ALKPHOS 73 01/29/2019 1048   ALKPHOS 90 04/18/2017 0853   AST 28 01/29/2019 1048   AST 16 01/29/2019 0855   AST 17 04/18/2017 0853   ALT 16 01/29/2019 1048   ALT 13 01/29/2019 0855   ALT 16 04/18/2017 0853   BILITOT 0.6 01/29/2019 1048   BILITOT 0.3 01/29/2019 0855   BILITOT 0.63 04/18/2017 0853      No components found for: MEQAS341  No results for input(s): INR in the last 168  hours.  Urinalysis    Component Value Date/Time   COLORURINE STRAW (A) 11/19/2016 1750   APPEARANCEUR CLEAR 11/19/2016 1750   LABSPEC 1.003 (L) 11/19/2016 1750   PHURINE 7.0 11/19/2016 1750   GLUCOSEU NEGATIVE 11/19/2016 1750   HGBUR NEGATIVE 11/19/2016 1750   BILIRUBINUR NEGATIVE 11/19/2016 1750   BILIRUBINUR neg 02/16/2015 1318   KETONESUR NEGATIVE 11/19/2016 1750   PROTEINUR NEGATIVE 11/19/2016 1750   UROBILINOGEN 0.2 02/16/2015 1318   UROBILINOGEN 0.2 11/18/2013 1321   NITRITE NEGATIVE 11/19/2016 1750   LEUKOCYTESUR TRACE (A) 11/19/2016 1750     STUDIES: No results found.   ELIGIBLE FOR AVAILABLE RESEARCH PROTOCOL: no  ASSESSMENT: 78 y.o. [place] woman  (1) status post right breast upper outer quadrant lumpectomy and axillary lymph node dissection in 2007 for what appears to have been a T1 N0, stage IA invasive ductal carcinoma, treated adjuvantly with radiation (33 sessions)  METASTATIC DISEASE definitively documented Sept 2015 (2) bronchoscopic biopsy 01/07/2014 showed a low-grade mucinous breast cancer, strongly estrogen receptor positive, progesterone receptor positive, HER-2 negative; staging studies confirmed extensive hypermetabolic adenopathy, multiple bone lesions, likely early lung involvement, but no liver lesions.  (a) bone scan and chest CT 07/09/2016 shows stable disease.  (b) CA-27-29 is moderately informative  (c) bone scan and CT scan of the chest 04/24/2017 shows essentially stable disease  (3) on Arimidex between September 2015 and June 2017, with evidence of response; discontinued secondary to side effects  (a) bone density 04/10/2016 shows osteopenia with a T score of -2.1  (4) on monthly denosumab/Xgeva between 07/30/2014 and 10/04/2015, discontinued due to patient's concerns regarding osteonecrosis of the jaw  (a) discussed again October 2017, the patient adamantly refusing denosumab  (5) started fulvestrant 01/26/2016    (a) Palbociclib  added at 75 mg M/W/F, beginning mid February 2018  (b) chest CT scan obtained 12/02/2017 shows small but measurable growth of lung lesions; bones are stable  (c) palbociclib dose increased to 75 mg daily 21/7 beginning 12/23/2017  (d) palbociclib and fulvestrant held after 01/29/2019 fulvestrant dose, which was associated with syncope.  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  Since then however she lost her husband which is a major blow.  In addition she tells me she is terrified of having any further fulvestrant treatments, afraid that the same thing that happened last time (syncope and so on as  described above) might occur again.  She refuses treatment today and is not sure she ever will want to resume therapy.  I discussed the fact that she has gone through major loss and it is best not to make any major decisions for the next several months.  That includes thoughts regarding moves or getting rid of property and so on.  She really needs a little bit of time to get into her new reality before she makes any of those decisions.  I think if she were pushed to receive treatment today she would not be able to tell the difference between side effects from the treatment and some of the problems she is facing which are causing her multiple difficulties for example with sleeping and sometimes feeling short of breath due to anxiety.  I think she is sufficiently stable that we can wait until mid January to resume therapy.  She is agreeable to this plan.  This will get her through the holidays.  When she sees me in January hopefully we can resume fulvestrant.  Alternatively we can go to a different aromatase inhibitor, such as  letrozole, and resume palbociclib at that time  At some point she will need ENT referral for biopsy of the left nasal cavity mass although clearly this is of low malignant potential if it is a cancer.  At the next visit also we will discuss healthcare power of attorney issues  She knows to call me for any other problems that may develop before then.  Erica Young. Erica Britain, MD  03/25/19 1:00 PM Medical Oncology and Hematology [facility] [place], [place] [address] Tel. [phone]    Fax. [phone]   I, Wilburn Mylar, am acting as scribe for Dr. Virgie Young. Deaisa Merida.  I, Lurline Del MD, have reviewed the above documentation for accuracy and completeness, and I agree with the above.

## 2019-03-24 NOTE — Telephone Encounter (Signed)
Spoke with patient and a telephone visit scheduled.

## 2019-03-24 NOTE — Telephone Encounter (Signed)
Copied from [place] [id number]. Topic: General - Inquiry >> Mar 24, 2019 11:09 AM Richardo Priest, NT wrote: Reason for CRM: Maudie Mercury, RN with [facility], called to inform PCP that patient is starting to have cold like symptoms and sinus issues. Pt wanted to know if they could have an inhaler called in to local pharmacy. RN encouraged patient to call office but has a feeling she will not since she is expecting family for the holiday. Please advise. Call back is [phone].

## 2019-03-25 ENCOUNTER — Other Ambulatory Visit: Payer: Medicare Other

## 2019-03-25 ENCOUNTER — Ambulatory Visit: Payer: Medicare Other

## 2019-03-25 ENCOUNTER — Inpatient Hospital Stay (HOSPITAL_BASED_OUTPATIENT_CLINIC_OR_DEPARTMENT_OTHER): Payer: Medicare Other | Admitting: Oncology

## 2019-03-25 ENCOUNTER — Inpatient Hospital Stay: Payer: Medicare Other

## 2019-03-25 ENCOUNTER — Inpatient Hospital Stay: Payer: Medicare Other | Admitting: General Practice

## 2019-03-25 ENCOUNTER — Telehealth: Payer: Self-pay | Admitting: Internal Medicine

## 2019-03-25 ENCOUNTER — Other Ambulatory Visit: Payer: Self-pay

## 2019-03-25 VITALS — BP 128/72 | HR 70 | Temp 98.0°F | Resp 17 | Ht 62.0 in | Wt 128.9 lb

## 2019-03-25 DIAGNOSIS — C7951 Secondary malignant neoplasm of bone: Secondary | ICD-10-CM

## 2019-03-25 DIAGNOSIS — C50411 Malignant neoplasm of upper-outer quadrant of right female breast: Secondary | ICD-10-CM

## 2019-03-25 DIAGNOSIS — Z170 Estrogen receptor positive status [ER+]: Secondary | ICD-10-CM | POA: Diagnosis not present

## 2019-03-25 DIAGNOSIS — C50911 Malignant neoplasm of unspecified site of right female breast: Secondary | ICD-10-CM | POA: Diagnosis not present

## 2019-03-25 DIAGNOSIS — C7800 Secondary malignant neoplasm of unspecified lung: Secondary | ICD-10-CM | POA: Diagnosis not present

## 2019-03-25 DIAGNOSIS — J349 Unspecified disorder of nose and nasal sinuses: Secondary | ICD-10-CM | POA: Diagnosis not present

## 2019-03-25 DIAGNOSIS — C771 Secondary and unspecified malignant neoplasm of intrathoracic lymph nodes: Secondary | ICD-10-CM | POA: Diagnosis not present

## 2019-03-25 DIAGNOSIS — Z87891 Personal history of nicotine dependence: Secondary | ICD-10-CM | POA: Diagnosis not present

## 2019-03-25 DIAGNOSIS — C7971 Secondary malignant neoplasm of right adrenal gland: Secondary | ICD-10-CM | POA: Diagnosis not present

## 2019-03-25 NOTE — Progress Notes (Signed)
[place] CSW Progress Notes  Met w patient during her visit here at [facility].  Is struggling w grief after recent (3 weeks ago) and unexpected death of her husband while he was at [facility]  Although husband was ill, she did not expect him to die suddenly and is struggling w fact that she was unable to say goodbye or be with him when he died.  She reports a 7 year married with much mutual support and intimacy.  She now feels alone in their home, has difficulty sleeping/eating, difficulty concentrating on much else other than his death and her sorrow at missing him.  Sister is staying with her for a few weeks and son checks on her daily - both of these are a comfort. Feels like she is "going through the motions" of life, is feeling mostly sorrow and grief at the loss of her valued life partner.  CSW provided supportive listening and empathy.  Normalized expected grief in context of recent loss of significant life partner.  Provided resources from [employer].  Pt declined referral for grief counseling at this time - CSW agrees that the normal process of grief can be allowed to unfold - will check in w patient in two weeks to determine if more support is needed.  Patient also appreciates supportive calls w [facility] RN CM and finds these have been very beneficial.  Edwyna Shell, [place] Worker Phone:  [phone]

## 2019-03-27 ENCOUNTER — Inpatient Hospital Stay: Payer: Medicare Other

## 2019-03-27 ENCOUNTER — Telehealth: Payer: Self-pay | Admitting: Oncology

## 2019-03-27 NOTE — Telephone Encounter (Signed)
Please call patient back regarding scheduled appointments 05/21/2019.   Pt requesting to see Dr. Jana Hakim prior to labs and injection.    Ok to scheduled for MD visit, followed by labs and injection.

## 2019-03-27 NOTE — Telephone Encounter (Signed)
I talk with patient but she would like a call back from the nurse or MD

## 2019-03-30 ENCOUNTER — Other Ambulatory Visit: Payer: Self-pay

## 2019-03-30 ENCOUNTER — Encounter: Payer: Self-pay | Admitting: Family Medicine

## 2019-03-30 ENCOUNTER — Telehealth (INDEPENDENT_AMBULATORY_CARE_PROVIDER_SITE_OTHER): Payer: Medicare Other | Admitting: Family Medicine

## 2019-03-30 DIAGNOSIS — C7951 Secondary malignant neoplasm of bone: Secondary | ICD-10-CM | POA: Diagnosis not present

## 2019-03-30 DIAGNOSIS — E785 Hyperlipidemia, unspecified: Secondary | ICD-10-CM | POA: Diagnosis not present

## 2019-03-30 DIAGNOSIS — I69392 Facial weakness following cerebral infarction: Secondary | ICD-10-CM | POA: Diagnosis not present

## 2019-03-30 DIAGNOSIS — I69354 Hemiplegia and hemiparesis following cerebral infarction affecting left non-dominant side: Secondary | ICD-10-CM | POA: Diagnosis not present

## 2019-03-30 DIAGNOSIS — I5040 Unspecified combined systolic (congestive) and diastolic (congestive) heart failure: Secondary | ICD-10-CM | POA: Diagnosis not present

## 2019-03-30 DIAGNOSIS — E43 Unspecified severe protein-calorie malnutrition: Secondary | ICD-10-CM | POA: Diagnosis not present

## 2019-03-30 DIAGNOSIS — K5790 Diverticulosis of intestine, part unspecified, without perforation or abscess without bleeding: Secondary | ICD-10-CM | POA: Diagnosis not present

## 2019-03-30 DIAGNOSIS — J019 Acute sinusitis, unspecified: Secondary | ICD-10-CM

## 2019-03-30 DIAGNOSIS — C50911 Malignant neoplasm of unspecified site of right female breast: Secondary | ICD-10-CM | POA: Diagnosis not present

## 2019-03-30 DIAGNOSIS — F419 Anxiety disorder, unspecified: Secondary | ICD-10-CM | POA: Diagnosis not present

## 2019-03-30 DIAGNOSIS — I110 Hypertensive heart disease with heart failure: Secondary | ICD-10-CM | POA: Diagnosis not present

## 2019-03-30 DIAGNOSIS — I69328 Other speech and language deficits following cerebral infarction: Secondary | ICD-10-CM | POA: Diagnosis not present

## 2019-03-30 DIAGNOSIS — M6281 Muscle weakness (generalized): Secondary | ICD-10-CM | POA: Diagnosis not present

## 2019-03-30 DIAGNOSIS — Z87891 Personal history of nicotine dependence: Secondary | ICD-10-CM | POA: Diagnosis not present

## 2019-03-30 DIAGNOSIS — D72819 Decreased white blood cell count, unspecified: Secondary | ICD-10-CM | POA: Diagnosis not present

## 2019-03-30 DIAGNOSIS — K760 Fatty (change of) liver, not elsewhere classified: Secondary | ICD-10-CM | POA: Diagnosis not present

## 2019-03-30 DIAGNOSIS — I700 Atherosclerosis of aorta: Secondary | ICD-10-CM | POA: Diagnosis not present

## 2019-03-30 DIAGNOSIS — M545 Low back pain: Secondary | ICD-10-CM | POA: Diagnosis not present

## 2019-03-30 DIAGNOSIS — Z7982 Long term (current) use of aspirin: Secondary | ICD-10-CM | POA: Diagnosis not present

## 2019-03-30 DIAGNOSIS — Z9181 History of falling: Secondary | ICD-10-CM | POA: Diagnosis not present

## 2019-03-30 MED ORDER — AMOXICILLIN-POT CLAVULANATE 875-125 MG PO TABS: 1.0000 | ORAL_TABLET | Freq: Two times a day (BID) | ORAL | 0 refills | Status: DC

## 2019-03-30 NOTE — Progress Notes (Signed)
Virtual Visit via Telephone Note  I connected with the patient on 03/30/19 at  1:15 PM EST by telephone and verified that I am speaking with the correct person using two identifiers. We attempted to connect virtually but we had technical difficulties with the audio and video.     I discussed the limitations, risks, security and privacy concerns of performing an evaluation and management service by telephone and the availability of in person appointments. I also discussed with the patient that there may be a patient responsible charge related to this service. The patient expressed understanding and agreed to proceed.  Location patient: home Location provider: work or home office Participants present for the call: patient, provider Patient did not have a visit in the prior 7 days to address this/these issue(s).   History of Present Illness: Here for 2 days of pressure in the left cheek, left sided headache, and blowing green mucus from the nose. No eye pain or change in vision. No fever or ST or cough. Using Tylenol.    Observations/Objective: Patient sounds cheerful and well on the phone. I do not appreciate any SOB. Speech and thought processing are grossly intact. Patient reported vitals:  Assessment and Plan: Sinusitis, treat with Augmentin. Add Mucinex pen.  Alysia Penna, MD   Follow Up Instructions:     [phone] 11-20 [phone] 21-30 I did not refer this patient for an OV in the next 24 hours for this/these issue(s).  I discussed the assessment and treatment plan with the patient. The patient was provided an opportunity to ask questions and all were answered. The patient agreed with the plan and demonstrated an understanding of the instructions.   The patient was advised to call back or seek an in-person evaluation if the symptoms worsen or if the condition fails to improve as anticipated.  I provided 11 minutes of non-face-to-face time during this  encounter.   Alysia Penna, MD

## 2019-03-31 ENCOUNTER — Telehealth: Payer: Self-pay | Admitting: Internal Medicine

## 2019-03-31 ENCOUNTER — Encounter: Payer: Self-pay | Admitting: *Deleted

## 2019-03-31 ENCOUNTER — Other Ambulatory Visit: Payer: Self-pay | Admitting: *Deleted

## 2019-03-31 DIAGNOSIS — I5040 Unspecified combined systolic (congestive) and diastolic (congestive) heart failure: Secondary | ICD-10-CM | POA: Diagnosis not present

## 2019-03-31 DIAGNOSIS — M6281 Muscle weakness (generalized): Secondary | ICD-10-CM | POA: Diagnosis not present

## 2019-03-31 DIAGNOSIS — C50911 Malignant neoplasm of unspecified site of right female breast: Secondary | ICD-10-CM | POA: Diagnosis not present

## 2019-03-31 DIAGNOSIS — C7951 Secondary malignant neoplasm of bone: Secondary | ICD-10-CM | POA: Diagnosis not present

## 2019-03-31 DIAGNOSIS — I110 Hypertensive heart disease with heart failure: Secondary | ICD-10-CM | POA: Diagnosis not present

## 2019-03-31 DIAGNOSIS — I69354 Hemiplegia and hemiparesis following cerebral infarction affecting left non-dominant side: Secondary | ICD-10-CM | POA: Diagnosis not present

## 2019-03-31 NOTE — Patient Outreach (Signed)
[facility]) Care Management  03/31/2019  Erica Young 29-May-1940 [id number]   CSW made a second attempt to try and contact patient today to follow-up regarding social work services and resources, as well as to provide counseling and supportive services for depression and grief and loss; however, patient was unavailable at the time of CSW's call.  CSW left a HIPAA compliant message for patient on her home number [phone]), but was unable to leave a HIPAA compliant message for patient on her cell phone number [phone]).  CSW continues to await a return call.  CSW will make a third and final outreach attempt within the next 3-4 business days, if a return call is not received from patient in the meantime.  CSW will also mail an "Unsuccessful Outreach Letter" to patient's home, encouraging patient to contact CSW at her earliest convenience if she is interested in continuing to receive social work services through [place] with [facility].    CSW will converse with Jackelyn Poling, patient's RNCM, also with [place] Management, encouraging Erica Young to have patient contact CSW directly if she is able to make successful outreach with patient.  Erica Young seems to have established a great rapport with patient.  CSW noted that patient had an opportunity to converse with Edwyna Shell, Licensed Clinical Social Worker with the [facility], regarding the recent loss of her husband and her struggle with not being able to say goodbye to him before his passing.  According to Ms. Candis Schatz, patient declined grief and loss counseling, as of  Wednesday, March 25, 2019, understanding that she is experiencing symptoms associated with the normal grieving process.  Patient appears to have a great support system, especially through family members, as her sister is currently staying with her for a few weeks and her son checks in with her  daily, either by phone or via home visit.  [name], BSW, MSW, LCSW  Licensed Education officer, environmental Health System  Mailing [place] [address], [place], [place] [address] Physical Address-[address], [place], [place] [address] Toll Free Main # [phone] Fax # [phone] Cell # [phone]  Office # [phone] [email or web address]

## 2019-03-31 NOTE — Telephone Encounter (Signed)
Medication Refill - Medication: Meclizine 25mg  tablets  Pt states she has previous rx for this medication, but they have expired. Please advise.    Preferred Pharmacy:  [facility] STORE [place], [address] [phone] (Phone) [phone] (Fax)     Pt was advised that RX refills may take up to 3 business days. We ask that you follow-up with your pharmacy.

## 2019-04-01 NOTE — Telephone Encounter (Signed)
This is for dizziness. Is she dizzy? If so, ok to fill 20 tabs.

## 2019-04-01 NOTE — Telephone Encounter (Signed)
Please advise. Not on current med list 

## 2019-04-03 NOTE — Telephone Encounter (Signed)
Left message for patient to call back. CRM created 

## 2019-04-06 ENCOUNTER — Other Ambulatory Visit: Payer: Self-pay | Admitting: *Deleted

## 2019-04-06 NOTE — Patient Outreach (Signed)
[facility]) Care Management  04/06/2019  Erica [name] [birth year] [id number]    CSW made a third and final attempt to try and contact patient today to follow-up regarding social work services and resources, as well as to provide counseling and supportive services for symptoms of depression, as well as grief and loss counseling; however, patient was unavailable at the time of CSW's call.  A HIPAA compliant message was left for patient on voicemail on her home phone [phone]), but CSW was unable to leave a HIPAA compliant message for patient on her cell phone (# [phone]).  CSW is currently awaiting a return call.  CSW will proceed with case closure in two business days, if a return call is not received from patient in the meantime, as required number of phone attempts have been made and an outreach letter was mailed to patient's home, allowing 10 business days for a response if patient was interested in continuing to received social work services through [place] with Scientist, clinical (histocompatibility and immunogenetics).  [name], BSW, MSW, LCSW  Licensed Education officer, environmental Health System  Mailing [place] [address] [place], [place] [address] Physical Address-[address], [place], [place] [address] Toll Free Main # [phone] Fax # [phone] Cell # [phone]  Office # [phone] [email or web address]

## 2019-04-06 NOTE — Patient Outreach (Signed)
Erica [name]) [name] Management  04/06/2019  Erica [name] 1941/03/06 [id number]   F/u Telephone Assessment/Screen-complex   Referral Date:02/27/19 Referral Source:[facility] PAC Liaison, Erica [name] Referral Reason:dx of Heart failure and cancer with mets,HTN,CVA, anxiety, FTT Please assign to [facility] CMfor [name] coordination and complex case management and [facility] Social Worker for meal assistance. Member to dc from [facility] on 02/27/19 with [facility]. Will be home alone with family checking in. Followed closely by Erica [name] is involved in Erica [name]'s [name]. [employer] supplement   Unsuccessful Outreach attempt # 1 To the hoe number [phone] and to the mobile number [phone] No answer. [facility] RN CM was not able to leave HIPAA compliant voicemail message along with CM's contact info.   Plan: [facility] RN CM  scheduled this engaged patient for another call attempt within 4 business days  Erica Wery L. Lavina Hamman, RN, BSN, [place] Coordinator Office number [phone] Mobile number [phone]  Main [facility] number [phone] Fax number [phone]

## 2019-04-08 ENCOUNTER — Inpatient Hospital Stay: Payer: No Typology Code available for payment source | Attending: Adult Health | Admitting: General Practice

## 2019-04-08 ENCOUNTER — Other Ambulatory Visit: Payer: Self-pay | Admitting: *Deleted

## 2019-04-08 ENCOUNTER — Encounter: Payer: Self-pay | Admitting: *Deleted

## 2019-04-08 DIAGNOSIS — Z170 Estrogen receptor positive status [ER+]: Secondary | ICD-10-CM

## 2019-04-08 NOTE — Patient Outreach (Signed)
[facility]) Care Management  04/08/2019  Erica Young 07/03/1940 [id number]    CSW will perform a case closure on patient, due to inability to maintain phone contact with patient, despite required number of phone attempts made and outreach letter mailed to patient's home, allowing 10 business days for a response if patient was interested in continuing to receive social work services and resources through [place] with Scientist, clinical (histocompatibility and immunogenetics).  CSW will notify patient's RNCM, Jackelyn Poling, also with [place] Management, of CSW's plans to close patient's case.  CSW will fax an update to patient's Primary Care Physician, Dr. Domingo Mend to ensure that they are aware of CSW's involvement with patient's plan of care.   [name], BSW, MSW, LCSW  Licensed Education officer, environmental Health System  Mailing [place] [address], [place], [place] [address] Physical Address-[address], [place], [place] [address] Toll Free Main # [phone] Fax # [phone] Cell # [phone]  Office # [phone] [email or web address]

## 2019-04-08 NOTE — Progress Notes (Signed)
[place] CSW Progress Notes  Phone call to patient to check in.  Continues to experience grief related to recent death of her husband.  Has sister staying with her now - she is unsure how long sister will stay but is enjoying her presence.  Facing reminders of her husband daily - remembering good times they had throughout marriage.  Sometimes hears his voice/senses his presence - this brings her comfort.  Discussed balance between acceptance and change.  Acceptance of loss brings possibility of taking small actions to make life better.  Feels husband would be encouraging her to "take good care of herself" - pay attention to health maintenance actions.  Expressed that being able to talk about husband and cry has been helpful.  Scheduled another phone session for January.  Can refer for grief counseling if she continues to struggle with moving through normal and expected grief.  Erica Shell, LCSW Clinical Social Worker Phone:  [phone]

## 2019-04-13 ENCOUNTER — Telehealth: Payer: Self-pay

## 2019-04-13 ENCOUNTER — Telehealth: Payer: Self-pay | Admitting: Internal Medicine

## 2019-04-13 NOTE — Telephone Encounter (Signed)
Pt canceled appt. For tomorrow. Please see phone note from 04/13/2019.

## 2019-04-13 NOTE — Telephone Encounter (Signed)
Patient is calling to ask Dr. Jerilee Hoh if she can increase her loratadine (CLARITIN) 10 MG tablet [[id number]]  The medication is not working and she has congestion. Patient has virtual appt scheduled tomorrow. Preferred [phone]

## 2019-04-13 NOTE — Telephone Encounter (Signed)
Spoke to pt and she has agreed to do the virtual visit for 2pm tomorrow.

## 2019-04-13 NOTE — Telephone Encounter (Signed)
Spoke to pt and she stated she is having sore throat, headache and nasal congestion. Pt stated that her head hurts so back and her ears fill clogged. Pt declined VV and was upset that she could not come into the office. Pt saw Dr.Fry 03/30/2019 for some of the issues. Pt stated that she would like some abx called in if she cannot see her provider. Pt was very upset and refused to do VV, UC or ED for sx. PT was advised that if sx worsen or persist to go to local ED. Pt verbalized understanding and stated she will call us if it worsens.

## 2019-04-13 NOTE — Telephone Encounter (Signed)
Copied from [place] [id number]. Topic: Quick Communication - See Telephone Encounter >> Apr 13, 2019  3:53 PM Blase Mess A wrote: CRM for notification. See Telephone encounter for: 04/13/19. She is not happy about virtual appt. She has congestion and cough. She is wanting to come to office for Al[place]. Please advise

## 2019-04-13 NOTE — Telephone Encounter (Signed)
She should wait and discuss this with Dr. Jerilee Hoh at tomorrow's visit

## 2019-04-13 NOTE — Telephone Encounter (Signed)
Noted . Please advise .

## 2019-04-13 NOTE — Telephone Encounter (Signed)
It looks like she has appt with [facility] tomorrow? I haven't seen her and would not send in abx just on note and with no prior relationship. She did see Dr. Sarajane Jews and was treated with augmentin by him 2 weeks ago. You could ask him, but if all sx are worse, he might give same advice that she has already been given.

## 2019-04-14 ENCOUNTER — Telehealth: Payer: Medicare Other | Admitting: Internal Medicine

## 2019-04-14 NOTE — Telephone Encounter (Signed)
Spoke to PCP CMA who advised that pt was called multiple time w/o response. CMA stated pt can reschedule appt for tomorrow 04/15/2019 @3 :00pm. Called pt 4x on provided numbers with no response or vm to update pt and get her rescheduled.

## 2019-04-14 NOTE — Telephone Encounter (Signed)
Noted  

## 2019-04-14 NOTE — Telephone Encounter (Signed)
Copied from [place] [id number]. Topic: Appointment Scheduling - Scheduling Inquiry for Clinic >> Apr 14, 2019  2:38 PM [place] A wrote: Reason for CRM: pt called in and stated she had an appt at 2pm today, she stated someone was suppose call her.  Per office send message to see when she needs to be rech'd >> Apr 14, 2019  2:42 PM [place], [place] A wrote: Pt stated she does not have a cell number and was gone this morning , she stated she was expecting a call around her appt time and not this morning.

## 2019-04-14 NOTE — Telephone Encounter (Signed)
Needs VV to discuss. [place]

## 2019-04-16 ENCOUNTER — Other Ambulatory Visit: Payer: Self-pay | Admitting: Internal Medicine

## 2019-04-16 ENCOUNTER — Telehealth (INDEPENDENT_AMBULATORY_CARE_PROVIDER_SITE_OTHER): Payer: Medicare Other | Admitting: Internal Medicine

## 2019-04-16 ENCOUNTER — Other Ambulatory Visit: Payer: Self-pay

## 2019-04-16 DIAGNOSIS — J3489 Other specified disorders of nose and nasal sinuses: Secondary | ICD-10-CM | POA: Diagnosis not present

## 2019-04-16 DIAGNOSIS — R0981 Nasal congestion: Secondary | ICD-10-CM

## 2019-04-16 MED ORDER — LORATADINE 10 MG PO TABS: 10.0000 mg | ORAL_TABLET | Freq: Every day | ORAL | 11 refills | Status: AC

## 2019-04-16 NOTE — Telephone Encounter (Signed)
Copied from [place] [id number]. Topic: Quick Communication - Rx Refill/Question >> Apr 16, 2019  4:25 PM [place] Price, [place] A wrote: Medication: loratadine (CLARITIN) 10 MG tablet (Patient stated that pharmacy has tried reaching out multiple times to get medication signed off on and sent back over. Patient stated that she only has 1 pill left.)  Has the patient contacted their pharmacy? {Yes (Agent: If no, request that the patient contact the pharmacy for the refill.) (Agent: If yes, when and what did the pharmacy advise?)Contact PCP[  Preferred Pharmacy (with phone number or street name): [facility] [place], [address] [place]  Phone:  [phone] Fax:  [phone]     Agent: Please be advised that RX refills may take up to 3 business days. We ask that you follow-up with your pharmacy.

## 2019-04-16 NOTE — Progress Notes (Signed)
Virtual Visit via Telephone Note  I connected with Erica Young on 04/16/19 at  4:00 PM EST by telephone and verified that I am speaking with the correct person using two identifiers.   I discussed the limitations, risks, security and privacy concerns of performing an evaluation and management service by telephone and the availability of in person appointments. I also discussed with the patient that there may be a patient responsible charge related to this service. The patient expressed understanding and agreed to proceed.  Location patient: home Location provider: work office Participants present for the call: patient, provider Patient did not have a visit in the prior 7 days to address this/these issue(s).   History of Present Illness:  Erica Young is still dealing with sinus issues.  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  She was seen in our office by Dr. Sarajane Jews on November 30 and given a course of Augmentin for 10 days which she thinks helped maybe a little bit.  She has been dealing with the sinus issues for at least 18 months but have gotten more severe this fall.  I did have a chance to review her past imaging.  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  She does not think this is Covid and does not feel like she needs to get tested.   Observations/Objective: Patient sounds cheerful and well on the phone. I do not appreciate any increased work of breathing. Speech and thought processing are grossly intact. Patient reported vitals: None reported   Current Outpatient Medications:  .  ALPRAZolam (XANAX) 0.25 MG tablet, Take 1 tablet (0.25 mg total) by mouth 3 (three) times daily as needed for anxiety., Disp: 10 tablet, Rfl:  0 .  aspirin EC 81 MG tablet, Take 81 mg by mouth daily after breakfast. , Disp: , Rfl:  .  carvedilol (COREG) 12.5 MG tablet, Take 1 tablet (12.5 mg total) by mouth 2 (two) times daily with a meal., Disp: 60 tablet, Rfl: 2 .  Cholecalciferol (VITAMIN D PO), Take 1,000 Units by mouth 2 (two) times daily., Disp: , Rfl:  .  Glucosamine 500 MG CAPS, Take 500 mg by mouth daily. , Disp: , Rfl:  .  loratadine (CLARITIN) 10 MG tablet, Take 1 tablet (10 mg total) by mouth daily., Disp: 30 tablet, Rfl: 11 .  losartan (COZAAR) 25 MG tablet, Take 0.5 tablets (12.5 mg total) by mouth daily., Disp: 30 tablet, Rfl: 2 .  ondansetron (ZOFRAN ODT) 4 MG disintegrating tablet, Take 1 tablet (4 mg total) by mouth every 8 (eight) hours as needed for nausea or vomiting., Disp: 20 tablet, Rfl: 0 .  OVER THE COUNTER MEDICATION, Beno for gas, takes twice daily, Disp: , Rfl:  .  Probiotic Product (PROBIOTIC DAILY PO), Take 1 tablet by mouth daily. , Disp: , Rfl:  .  spironolactone (ALDACTONE) 25 MG tablet, Take 0.5 tablets (12.5 mg total) by mouth daily., Disp: 30 tablet, Rfl: 2  Review of Systems:  Constitutional: Denies fever, chills, diaphoresis, appetite change and fatigue.  HEENT: Denies photophobia, eye pain, redness, hearing loss, ear pain,  mouth sores, trouble swallowing, neck pain, neck stiffness and tinnitus.   Respiratory: Denies SOB, DOE, cough, chest tightness,  and wheezing.   Cardiovascular: Denies chest pain, palpitations and leg swelling.  Gastrointestinal: Denies nausea, vomiting, abdominal pain, diarrhea, constipation, blood in stool and abdominal distention.  Genitourinary: Denies dysuria, urgency, frequency, hematuria, flank pain and difficulty urinating.  Endocrine: Denies: hot or cold intolerance, sweats, changes in hair or nails, polyuria, polydipsia. Musculoskeletal: Denies myalgias, back pain, joint swelling, arthralgias and gait problem.  Skin: Denies pallor, rash and wound.  Neurological:  Denies dizziness, seizures, syncope, weakness, light-headedness, numbness and headaches.  Hematological: Denies adenopathy. 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 -She just completed a course of Augmentin for 10 days, not sure that further antibiotics would be helpful. -I will place a referral to ENT to further evaluate this left nasal cavity mass. -In the meantime have advised Mucinex and Tylenol Sinus in addition to loratadine which may provide some relief. -Of course in the era of the COVID-19 pandemic, infection with COVID-19 is possible.  She does not want or feel like she needs to get tested.    I discussed the assessment and treatment plan with the patient. The patient was provided an opportunity to ask questions and all were answered. The patient agreed with the plan and demonstrated an understanding of the instructions.   The patient was advised to call back or seek an in-person evaluation if the symptoms worsen or if the condition fails to improve as anticipated.  I provided 14 minutes of non-face-to-face time during this encounter.   Lelon Frohlich, MD [facility]

## 2019-04-17 DIAGNOSIS — C7951 Secondary malignant neoplasm of bone: Secondary | ICD-10-CM | POA: Diagnosis not present

## 2019-04-17 DIAGNOSIS — M6281 Muscle weakness (generalized): Secondary | ICD-10-CM | POA: Diagnosis not present

## 2019-04-17 DIAGNOSIS — C50911 Malignant neoplasm of unspecified site of right female breast: Secondary | ICD-10-CM | POA: Diagnosis not present

## 2019-04-17 DIAGNOSIS — I69354 Hemiplegia and hemiparesis following cerebral infarction affecting left non-dominant side: Secondary | ICD-10-CM | POA: Diagnosis not present

## 2019-04-17 DIAGNOSIS — I5040 Unspecified combined systolic (congestive) and diastolic (congestive) heart failure: Secondary | ICD-10-CM | POA: Diagnosis not present

## 2019-04-17 DIAGNOSIS — I110 Hypertensive heart disease with heart failure: Secondary | ICD-10-CM | POA: Diagnosis not present

## 2019-04-22 DIAGNOSIS — M6281 Muscle weakness (generalized): Secondary | ICD-10-CM | POA: Diagnosis not present

## 2019-04-22 DIAGNOSIS — C50911 Malignant neoplasm of unspecified site of right female breast: Secondary | ICD-10-CM | POA: Diagnosis not present

## 2019-04-22 DIAGNOSIS — I110 Hypertensive heart disease with heart failure: Secondary | ICD-10-CM | POA: Diagnosis not present

## 2019-04-22 DIAGNOSIS — I69354 Hemiplegia and hemiparesis following cerebral infarction affecting left non-dominant side: Secondary | ICD-10-CM | POA: Diagnosis not present

## 2019-04-22 DIAGNOSIS — I5040 Unspecified combined systolic (congestive) and diastolic (congestive) heart failure: Secondary | ICD-10-CM | POA: Diagnosis not present

## 2019-04-22 DIAGNOSIS — C7951 Secondary malignant neoplasm of bone: Secondary | ICD-10-CM | POA: Diagnosis not present

## 2019-04-29 ENCOUNTER — Other Ambulatory Visit: Payer: Self-pay | Admitting: Oncology

## 2019-04-29 ENCOUNTER — Ambulatory Visit (INDEPENDENT_AMBULATORY_CARE_PROVIDER_SITE_OTHER): Payer: Medicare Other | Admitting: Otolaryngology

## 2019-04-29 ENCOUNTER — Other Ambulatory Visit: Payer: Self-pay

## 2019-04-29 ENCOUNTER — Encounter (INDEPENDENT_AMBULATORY_CARE_PROVIDER_SITE_OTHER): Payer: Self-pay | Admitting: Otolaryngology

## 2019-04-29 VITALS — Temp 97.3°F

## 2019-04-29 DIAGNOSIS — D385 Neoplasm of uncertain behavior of other respiratory organs: Secondary | ICD-10-CM

## 2019-04-29 NOTE — Progress Notes (Signed)
HPI: Erica Young is a 78 y.o. female who presents is referred by Dr. Deniece Ree for evaluation of left nasal mass.  Patient has had a remote history of breast cancer with apparent mets.  She is followed by Dr. Jana Hakim.  She has been having chronic problems with left-sided nasal obstruction and recurrent sinus infections.  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  She states that she underwent radiation therapy to this region 4 years ago. 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  This extends to the roof of the nose as well as to the lamina preparation as well as the septum and completely occludes the left nasal cavity.  A definitive diagnosis or biopsy of this mass has not been performed.. She has had intermittent sinus infections and has been treated with Augmentin which seems to help resolve the pain and discomfort.  Past Medical History:  Diagnosis Date  . Allergy   . Anxiety   . 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 Cancer [facility]) 2007   right breat ca  , lumpectomy and radiation tx (declined chemo and additional prophylactic meds)  . Cataract    both eyes  10/2016 Lt.     04/2017 Rt.   . CEREBROVASCULAR DISEASE 03/03/2010  . Diverticulitis   . DIVERTICULOSIS, COLON 03/03/2010  . Fatty liver 08/02/09   as per U/S done by [facility]  . Foramen ovale    positive bubble study  . GERD (gastroesophageal reflux disease)   . Headache(784.0)    she thinks sinus headaches  . History of cerebral artery stenosis    right middle  . HYPERLIPIDEMIA 03/03/2010  . Hypertension   . HYPOTHYROIDISM 03/03/2010   no longer on meds  . Internal hemorrhoid   . Low back pain   . Mastoiditis    noted on brain MRI  . Rib fracture   . Right leg pain   . Stroke [facility]) Oct. 2011   TIA  . Ulcer    Past  Surgical History:  Procedure Laterality Date  . BREAST LUMPECTOMY     right  . CATARACT EXTRACTION Left    10/2016   Rt 04/2017  . CHOLECYSTECTOMY    . COLONOSCOPY    . POLYPECTOMY     benign cecum  . SHOULDER SURGERY     right shoulder (dislocation)  . TONSILLECTOMY    . TUBAL LIGATION    . UPPER GASTROINTESTINAL ENDOSCOPY    . VIDEO BRONCHOSCOPY WITH ENDOBRONCHIAL ULTRASOUND N/A 01/07/2014   Procedure: VIDEO BRONCHOSCOPY WITH ENDOBRONCHIAL ULTRASOUND;  Surgeon: Ivin Poot, MD;  Location: [facility] OR;  Service: Thoracic;  Laterality: N/A;   Social History   Socioeconomic History  . Marital status: Widowed    Spouse name: [name] Deceased in 04-10-2023  . Number of children: 2  . Years of education: 64  . Highest education level: Master's degree (e.g., MA, MS, MEng, MEd, MSW, MBA)  Occupational History  . Occupation: Social Worker--Retired  Tobacco Use  . Smoking status: Former Smoker    Packs/day: 0.50    Years: 20.00    Pack years: 10.00    Types: Cigarettes    Start date: 58    Quit date: 05/01/1991    Years since quitting: 28.0  . Smokeless tobacco: Never  Used  Substance and Sexual Activity  . Alcohol use: No    Alcohol/week: 0.0 standard drinks  . Drug use: No  . Sexual activity: Yes  Other Topics Concern  . Not on file  Social History Narrative   Daily caffeine    Social Determinants of Health   Financial Resource Strain: Low Risk   . Difficulty of Paying Living Expenses: Not hard at all  Food Insecurity: No Food Insecurity  . Worried About Charity fundraiser in the Last Year: Never true  . Ran Out of Food in the Last Year: Never true  Transportation Needs: Unmet Transportation Needs  . Lack of Transportation (Medical): No  . Lack of Transportation (Non-Medical): Yes  Physical Activity: Inactive  . Days of Exercise per Week: 0 days  . Minutes of Exercise per Session: 0 min  Stress: Stress Concern Present  . Feeling of Stress : To some extent  Social  Connections: Moderately Isolated  . Frequency of Communication with Friends and Family: Once a week  . Frequency of Social Gatherings with Friends and Family: Once a week  . Attends Religious Services: 1 to 4 times per year  . Active Member of Clubs or Organizations: No  . Attends Archivist Meetings: Never  . Marital Status: Widowed   Family History  Problem Relation Age of Onset  . Heart disease Sister        cerebral vascular disease also  . Heart disease Brother        cerebral vascular disease also  . Heart disease Brother        cerebral vascular disease  . Heart disease Sister        cebreal vascular disease also  . Heart disease Sister        cebreal vascular diease also  . Heart disease Sister        cerebral vascular diease also  . Stomach cancer Father   . Colon cancer Neg Hx   . Esophageal cancer Neg Hx   . Rectal cancer Neg Hx   . Colon polyps Neg Hx   . Asthma Neg Hx    Allergies  Allergen Reactions  . Ciprofloxacin Anaphylaxis   Prior to Admission medications   Medication Sig Start Date End Date Taking? Authorizing Provider  ALPRAZolam (XANAX) 0.25 MG tablet Take 1 tablet (0.25 mg total) by mouth 3 (three) times daily as needed for anxiety. 02/04/19  Yes Danford, Suann Larry, MD  aspirin EC 81 MG tablet Take 81 mg by mouth daily after breakfast.    Yes [provider]  carvedilol (COREG) 12.5 MG tablet Take 1 tablet (12.5 mg total) by mouth 2 (two) times daily with a meal. 02/04/19  Yes Danford, Suann Larry, MD  Cholecalciferol (VITAMIN D PO) Take 1,000 Units by mouth 2 (two) times daily.   Yes [provider]  Glucosamine 500 MG CAPS Take 500 mg by mouth daily.    Yes [provider]  loratadine (CLARITIN) 10 MG tablet Take 1 tablet (10 mg total) by mouth daily. 04/16/19  Yes Isaac Bliss, Rayford Halsted, MD  losartan (COZAAR) 25 MG tablet Take 0.5 tablets (12.5 mg total) by mouth daily. 02/05/19  Yes Danford, Suann Larry,  MD  ondansetron (ZOFRAN ODT) 4 MG disintegrating tablet Take 1 tablet (4 mg total) by mouth every 8 (eight) hours as needed for nausea or vomiting. 03/13/19  Yes Erline Hau, MD  OVER THE COUNTER MEDICATION [place] for gas, takes  twice daily   Yes [provider]  Probiotic Product (PROBIOTIC DAILY PO) Take 1 tablet by mouth daily.    Yes [provider]  spironolactone (ALDACTONE) 25 MG tablet Take 0.5 tablets (12.5 mg total) by mouth daily. 02/05/19  Yes Danford, Suann Larry, MD     Positive ROS: Otherwise negative  All other systems have been reviewed and were otherwise negative with the exception of those mentioned in the HPI and as above.  Physical Exam: Constitutional: Alert, well-appearing, no acute distress Ears: External ears without lesions or tenderness. Ear canals are clear bilaterally with intact, clear TMs.  Nasal: External nose without lesions. Septum slightly deviated to the right.. Clear nasal passage on the right side.  Left side is completely occluded with intranasal mass.  This is mucosal covered. Oral: Lips and gums without lesions. Tongue and palate mucosa without lesions. Posterior oropharynx clear. Neck: No palpable adenopathy or masses Respiratory: Breathing comfortably  Skin: No facial/neck lesions or rash noted.  Procedures  Assessment: Left nasal cavity tumor  Plan: Discussed this with Dr. Jana Hakim.  He recommended obtaining a biopsy for diagnosis of the tumor. Discussed this with the patient on the phone and we will plan on scheduling this next week.   Radene Journey, MD   CC:

## 2019-04-30 ENCOUNTER — Other Ambulatory Visit: Payer: Self-pay | Admitting: *Deleted

## 2019-04-30 NOTE — Patient Outreach (Signed)
  [facility]) Care Management  04/30/2019  Erica Young 04-04-1941 [id number]   F/uTelephone Assessment/Screen-complex  Referral Date:02/27/19 Referral Source:[facility] PAC Liaison, [facility] [place] Referral Reason:dx of Heart failure and cancer with mets,HTN,CVA, anxiety, FTT Please assign to [facility] CMfor care coordination and complex case management and [facility] Social Worker for meal assistance. Member to dc from [facility]. Will be home alone with family checking in. Followed closely by [place] is involved in Erica Young's care. [place] Medicare supplement   Outreach successful at the home number [place]   Erica Young is able to verify HIPAA  [facility] RN CM discussed the reason for the follow up call   Erica Young reports she is doing "fine" She reports some issues with her telephone services and she has not been sure who to contacted since her husband has passes. She reports others have informed her also the they have attempted to call her unsuccessfully [facility] RN CM discussed the [facility] SW also made unsuccessful outreaches to her  [facility] RN CM encouraged her to contact the phone number on her telephone bill to have someone to assist her or to go to a local office of the telephone provider if possible  Erica Young informs [facility] RN CM she is about to eat her dinner and prefers a return call     Plan: [facility] RN CM  scheduled this engaged patient for another call attempt within 7-14 business days  Pt encouraged to return a call to [facility] RN CM prn  Routed note to MDs/NP/PA   [facility] CM Care Plan Problem One     Most Recent Value  Care Plan Problem One  Breast cancer wtih bone mets home care needs  Role Documenting the Problem One  Care Management Telephonic Coordinator  Care Plan for Problem One  Active  [facility] Long Term Goal   over the next 60 days patient will  receive knowledge of services and resources for home care of breast cancer with bone mets  [facility] Long Term Goal Start Date  03/10/19  Interventions for Problem One Long Term Goal  Pt unable to speak today  [facility] CM Short Term Goal #1   over the next 30 days patient will be offered [facility] if she chooses for grief related to the loss of her husband  [facility] CM Short Term Goal #1 Start Date  03/11/19  [facility] CM Short Term Goal #1 Met Date  03/16/19      [name], RN, BSN, [place] Coordinator Office number [phone] Mobile number [phone]  Main [facility] number [phone] Fax number [phone]

## 2019-05-06 ENCOUNTER — Telehealth: Payer: Self-pay | Admitting: *Deleted

## 2019-05-06 ENCOUNTER — Inpatient Hospital Stay: Payer: Medicare Other | Attending: Adult Health | Admitting: General Practice

## 2019-05-06 DIAGNOSIS — Z170 Estrogen receptor positive status [ER+]: Secondary | ICD-10-CM

## 2019-05-06 DIAGNOSIS — C771 Secondary and unspecified malignant neoplasm of intrathoracic lymph nodes: Secondary | ICD-10-CM | POA: Insufficient documentation

## 2019-05-06 DIAGNOSIS — Z87891 Personal history of nicotine dependence: Secondary | ICD-10-CM | POA: Insufficient documentation

## 2019-05-06 DIAGNOSIS — C7971 Secondary malignant neoplasm of right adrenal gland: Secondary | ICD-10-CM | POA: Insufficient documentation

## 2019-05-06 DIAGNOSIS — J3489 Other specified disorders of nose and nasal sinuses: Secondary | ICD-10-CM | POA: Insufficient documentation

## 2019-05-06 DIAGNOSIS — C7800 Secondary malignant neoplasm of unspecified lung: Secondary | ICD-10-CM | POA: Insufficient documentation

## 2019-05-06 DIAGNOSIS — C7951 Secondary malignant neoplasm of bone: Secondary | ICD-10-CM | POA: Insufficient documentation

## 2019-05-06 DIAGNOSIS — C50411 Malignant neoplasm of upper-outer quadrant of right female breast: Secondary | ICD-10-CM

## 2019-05-06 DIAGNOSIS — R634 Abnormal weight loss: Secondary | ICD-10-CM | POA: Insufficient documentation

## 2019-05-06 NOTE — Progress Notes (Signed)
[facility] CSW Progress Notes  :Phone call to patient to check in/assess progress towards meeting goals.  "I have good days and bad days."  "Overwhelmed" by dealing with the financial and emotional aftermath of death of her husband.  Is managing affairs by herself.  Is losing weight (5 pounds recent weight loss), not sleeping well (perhaps due to nasal congestion), difficulty concentrating. Concerned about possible tumor in her nose.  Will follow up w Dr Jana Hakim on 1/12 to discuss her options. Relied heavily on her husband while he was alive, "now I feel so alone." Will consider grief counseling - has been linked with Recruitment consultant and has an appointment on Thursday. Encouraged her to consider working with individual counselor until groups are able to meet again.  Edwyna Shell, LCSW Clinical Social Worker Phone:  [phone] Cell:  [phone]

## 2019-05-06 NOTE — Telephone Encounter (Signed)
Copied from [place] [id number]. Topic: General - Other >> May 06, 2019  4:31 PM Mcneil, Ja-Kwan wrote: Reason for CRM: Pt stated she needs a Rx refill for ALPRAZolam (XANAX) 0.25 MG tablet and she normally gets 30 to 90 tablets. Pt stated 10 tablets is not enough. Pt requests call back

## 2019-05-07 ENCOUNTER — Other Ambulatory Visit: Payer: Self-pay | Admitting: Internal Medicine

## 2019-05-07 ENCOUNTER — Ambulatory Visit: Payer: Medicare Other | Admitting: Internal Medicine

## 2019-05-07 DIAGNOSIS — C50411 Malignant neoplasm of upper-outer quadrant of right female breast: Secondary | ICD-10-CM

## 2019-05-07 MED ORDER — ALPRAZOLAM 0.25 MG PO TABS: 0.2500 mg | ORAL_TABLET | Freq: Three times a day (TID) | ORAL | 0 refills | Status: DC | PRN

## 2019-05-07 NOTE — Telephone Encounter (Signed)
Last refill info:  Edwin Dada, MD None  Outpatient Medication Detail   Disp Refills Start End   ALPRAZolam (XANAX) 0.25 MG tablet 10 tablet 0 02/04/2019    Sig - Route: Take 1 tablet (0.25 mg total) by mouth 3 (three) times daily as needed for anxiety. - Oral   Class: Print

## 2019-05-07 NOTE — Telephone Encounter (Signed)
Message Routed to PCP CMA 

## 2019-05-07 NOTE — Telephone Encounter (Signed)
Left message on machine for the pharmacy.

## 2019-05-07 NOTE — Telephone Encounter (Signed)
Patient called in stating that script is still not at pharmacy and would like it sent today. Please advise and call back.

## 2019-05-11 ENCOUNTER — Encounter (HOSPITAL_COMMUNITY): Admission: RE | Payer: Self-pay | Source: Home / Self Care

## 2019-05-11 ENCOUNTER — Ambulatory Visit (HOSPITAL_COMMUNITY): Admission: RE | Admit: 2019-05-11 | Payer: Medicare Other | Source: Home / Self Care | Admitting: Otolaryngology

## 2019-05-11 SURGERY — ENDOSCOPY, NOSE
Anesthesia: General

## 2019-05-11 NOTE — Progress Notes (Signed)
[name]  Telephone:(336) [phone] Fax:(336) [phone]     ID: Erica Young DOB: [birth year]  MR#: [id number]  XLK#:[id number]  Patient Care Team: Isaac Bliss, Rayford Halsted, MD as PCP - General (Internal Medicine) Debara Pickett Nadean Corwin, MD as PCP - Cardiology (Cardiology) Tanda Rockers, MD as Consulting Physician (Pulmonary Disease) Jayci Ellefson, Virgie Dad, MD as Consulting Physician (Oncology) Armbruster, Carlota Raspberry, MD as Consulting Physician (Gastroenterology) Bobbitt, Sedalia Muta, MD as Consulting Physician (Allergy and Immunology) Barbaraann Faster, RN as [place] [facility] OTHER MD:   CHIEF COMPLAINT: 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   Erica Young.  Her last dose was 01/29/2019.  She had a syncopal episode that may have been related to that.  She was also on palbociclib.  She feels that this weakened her immune system, which is certainly one of the possible side effects.  She has had some sinus issues and she wonders if that is related.  Her palbociclib has been on hold since 01/29/2019.  She met with Dr. Radene Journey on 04/29/2019 to discuss her nasal cavity mass. She was scheduled to undergo nasal endoscopy with biopsy yesterday, 05/11/2019, but this was cancelled by the patient.  She was living at [facility] for rehab, but is now back home.  Her sister had been visiting but left yesterday.  Her last imaging studies were a CT scan of the chest which showed stable mild right hilar and internal mammary adenopathy and stable right adrenal lesions, with a possible new lesion in the dome of the right liver; brain MRI on 01/30/2019 showed no brain mets but 5 cm left nasal cavity mass which as noted above was to have been biopsied  yesterday.   REVIEW OF SYSTEMS: Erica Young denies any bony pain.  She has no nausea or vomiting no altered taste and no loss of appetite although she has lost a little weight.  She is aware that she is grieving for her husband and it appears to me that this is very normal at this point in other words I do not see obvious evidence of depression.  What is bothering her is the mass in the sinus area.  She has some dull headaches and some issue with the left nostril blockage.  She put a little bit of Vicks VapoRub in the left nostril area and there was some drainage and she wondered what that meant.  She has not had any falls or gait imbalance problem.  She continues to have various GI concerns but these are really stable.  A detailed review of systems today was otherwise noncontributory    BREAST CANCER HISTORY: From the earlier summary note  Erica Young tells me in 2007 while living in [place] she was found to have a very small cancer in the upper-outer quadrant of the right breast, about the size of the P, noted on mammography. It was not palpable. She had a right lumpectomy and full sentinel lymph node sampling. She then received adjuvant radiation, to a total of 33 treatments. She received no systemic therapy.  She then did well until December 2014, when she had a fall and complain of pain in her left ribs. Rib films and chest x-ray on 04/13/2013 showed no fractures, but CT of the abdomen and pelvis on the same  day found subcarinal and right hilar adenopathy. This was followed up with a chest CT scan 05/05/2013 confirming extensive mediastinal and right hilar lymphadenopathy, with multiple pulmonary nodules, the largest measuring 0.9 cm. PET scan 05/21/2013 showed hypermetabolic adenopathy in the right and left paratracheal areas, the precarinal, subcarinal and right hilar areas, but no involvement of the liver, and the lung nodules were not hypermetabolic (although they were below the level of reliable  detection). In addition, at L5 there was a lucency measuring 1.2 cm.  The PET scan also showed a hypermetabolic focus on the thyroid gland, which was evaluated with neck ultrasound and biopsy 13/24/4010, showing a follicular lesion of undetermined significance (([id number]).  The patient was referred to pulmonary [Dr Melvyn Novas and Dr Julien Nordmann for further evaluation. Repeat PET scan 12/28/2013 showed, in addition to the adenopathy previously noted, now multiple bony metastases. On 01/07/2014 the patient underwent bronchoscopy and this showed ([id number], together with separate cytology] a low-grade mucinous invasive breast cancer, estrogen receptor 100% positive, progesterone receptor 14% positive, with no HER-2 amplification, the signals ratio being 1.30 and the number per cell 1.95.  The patient was started on anastrozole 01/22/2014; monthly denosumab/Xgeva was added 07/30/2014. She appeared to tolerate this well, and her CA-27-29 (127 at baseline) normalized. Most recent CT scans of chest abdomen and pelvis 10/03/2015 showed continuing response. Despite this good news however, the patient decided to go off anastrozole and denosumab/Xgeva as of June 2017. By the time she saw Dr. Earlie Server again in September 2017 her tumor marker had doubled. At that time the patient was referred to the breast clinic for a second opinion regarding further evaluation and treatment.   PAST MEDICAL HISTORY: Past Medical History:  Diagnosis Date  . Allergy   . Anxiety   . Bone cancer ([place]) mri 11/11/14   right parietal bone,left cribriform plate metastases  . Cancer [facility]) 2007   right breat ca  , lumpectomy and radiation tx (declined chemo and additional prophylactic meds)  . Cataract    both eyes  10/2016 Lt.     04/2017 Rt.   . CEREBROVASCULAR DISEASE 03/03/2010  . Diverticulitis   . DIVERTICULOSIS, COLON 03/03/2010  . Fatty liver 08/02/09   as per U/S done by [facility]  . Foramen ovale    positive bubble  study  . GERD (gastroesophageal reflux disease)   . Headache(784.0)    she thinks sinus headaches  . History of cerebral artery stenosis    right middle  . HYPERLIPIDEMIA 03/03/2010  . Hypertension   . HYPOTHYROIDISM 03/03/2010   no longer on meds  . Internal hemorrhoid   . Low back pain   . Mastoiditis    noted on brain MRI  . Rib fracture   . Right leg pain   . Stroke [facility]) Oct. 2011   TIA  . Ulcer     PAST SURGICAL HISTORY: Past Surgical History:  Procedure Laterality Date  . BREAST LUMPECTOMY     right  . CATARACT EXTRACTION Left    10/2016   Rt 04/2017  . CHOLECYSTECTOMY    . COLONOSCOPY    . POLYPECTOMY     benign cecum  . SHOULDER SURGERY     right shoulder (dislocation)  . TONSILLECTOMY    . TUBAL LIGATION    . UPPER GASTROINTESTINAL ENDOSCOPY    . VIDEO BRONCHOSCOPY WITH ENDOBRONCHIAL ULTRASOUND N/A 01/07/2014   Procedure: VIDEO BRONCHOSCOPY WITH ENDOBRONCHIAL ULTRASOUND;  Surgeon: Ivin Poot, MD;  Location:  [facility] OR;  Service: Thoracic;  Laterality: N/A;    FAMILY HISTORY Family History  Problem Relation Age of Onset  . Heart disease Sister        cerebral vascular disease also  . Heart disease Brother        cerebral vascular disease also  . Heart disease Brother        cerebral vascular disease  . Heart disease Sister        cebreal vascular disease also  . Heart disease Sister        cebreal vascular diease also  . Heart disease Sister        cerebral vascular diease also  . Stomach cancer Father   . Colon cancer Neg Hx   . Esophageal cancer Neg Hx   . Rectal cancer Neg Hx   . Colon polyps Neg Hx   . Asthma Neg Hx   The patient's father died from stomach cancer in his late 75s. The patient's mother died in her 47s from a stroke. The patient has 2 brothers, 4 sisters. One sister had leukemia. There is no history of breast, colon, or ovarian cancer in the family to her knowledge   GYNECOLOGIC HISTORY:  No LMP recorded. Patient is  postmenopausal. Menarche age 26, first live birth age 24, the patient is Erica Young. She went through menopause in her late 3s. She took no hormone replacement. She took oral contraceptives for approximately 2 years remotely without complications.   SOCIAL HISTORY: (updated 03/2019) Erica Young is originally from [place], [place]. She worked as a Education officer, museum, particularly, in the field of substance abuse. Her husband, Erica Young, was a retired substance abuse Social worker. He unfortunately passed away in late October/early November 2020. Altizer has 2 children from her first marriage, Erica Young, who lives in [place], and worked as a Audiological scientist but is now disabled, and Erica Young, who lives in [place] and works in Engineer, mining. Erica Young has 3 children from his first marriage, Erica Young, Erica Young, and Erica Young. Erica Young lives in [place] and has his own trucking business. Erica Young tells me he is estranged from his 2 daughters Erica Young (a retired Pharmacist, hospital) and Erica Young (who works in a bank). They are living on [place].The patient has 8 grandchildren. Janelle attends [employer].   ADVANCED DIRECTIVES:    HEALTH MAINTENANCE: Social History   Tobacco Use  . Smoking status: Former Smoker    Packs/day: 0.50    Years: 20.00    Pack years: 10.00    Types: Cigarettes    Start date: 14    Quit date: 05/01/1991    Years since quitting: 28.0  . Smokeless tobacco: Never Used  Substance Use Topics  . Alcohol use: No    Alcohol/week: 0.0 standard drinks  . Drug use: No     Colonoscopy:  PAP:  Bone density:   Allergies  Allergen Reactions  . Ciprofloxacin Anaphylaxis    Current Outpatient Medications  Medication Sig Dispense Refill  . ALPRAZolam (XANAX) 0.25 MG tablet Take 1 tablet (0.25 mg total) by mouth 3 (three) times daily as needed for anxiety. 90 tablet 0  . aspirin EC 81 MG tablet Take 81 mg by mouth daily after breakfast.     . carvedilol (COREG) 12.5 MG tablet Take  1 tablet (12.5 mg total) by mouth 2 (two) times daily with a meal. 60 tablet 2  . Cholecalciferol (VITAMIN D PO) Take 1,000 Units by mouth 2 (two) times daily.    Marland Kitchen  Glucosamine 500 MG CAPS Take 500 mg by mouth daily.     Marland Kitchen loratadine (CLARITIN) 10 MG tablet Take 1 tablet (10 mg total) by mouth daily. 30 tablet 11  . losartan (COZAAR) 25 MG tablet Take 0.5 tablets (12.5 mg total) by mouth daily. 30 tablet 2  . ondansetron (ZOFRAN ODT) 4 MG disintegrating tablet Take 1 tablet (4 mg total) by mouth every 8 (eight) hours as needed for nausea or vomiting. 20 tablet 0  . OVER THE COUNTER MEDICATION Beno for gas, takes twice daily    . Probiotic Product (PROBIOTIC DAILY PO) Take 1 tablet by mouth daily.     Marland Kitchen spironolactone (ALDACTONE) 25 MG tablet Take 0.5 tablets (12.5 mg total) by mouth daily. 30 tablet 2   No current facility-administered medications for this visit.    OBJECTIVE: Middle-aged Latin American woman in no acute distress  Vitals:   05/12/19 1103  BP: (!) 109/59  Pulse: 77  Resp: 18  Temp: 98.3 F (36.8 C)  SpO2: 97%     Body mass index is 23.12 kg/m.    ECOG FS:1 - Symptomatic but completely ambulatory  Sclerae unicteric, EOMs intact Wearing a mask No cervical or supraclavicular adenopathy Lungs no rales or rhonchi Heart regular rate and rhythm Abd soft, nontender, positive bowel sounds MSK no focal spinal tenderness, no upper extremity lymphedema Neuro: nonfocal, well oriented, appropriate affect Breasts: Deferred   LAB RESULTS:  CMP     Component Value Date/Time   NA 137 02/04/2019 0330   NA 138 04/18/2017 0853   K 4.2 02/04/2019 0330   K 4.4 04/18/2017 0853   CL 103 02/04/2019 0330   CO2 25 02/04/2019 0330   CO2 29 04/18/2017 0853   GLUCOSE 112 (H) 02/04/2019 0330   GLUCOSE 95 04/18/2017 0853   BUN 17 02/04/2019 0330   BUN 16.6 04/18/2017 0853   CREATININE 0.82 02/04/2019 0330   CREATININE 0.77 01/29/2019 0855   CREATININE 0.8 04/18/2017 0853    CALCIUM 8.7 (L) 02/04/2019 0330   CALCIUM 9.2 04/18/2017 0853   PROT 7.1 01/29/2019 1048   PROT 7.0 04/18/2017 0853   ALBUMIN 4.0 01/29/2019 1048   ALBUMIN 3.8 04/18/2017 0853   AST 28 01/29/2019 1048   AST 16 01/29/2019 0855   AST 17 04/18/2017 0853   ALT 16 01/29/2019 1048   ALT 13 01/29/2019 0855   ALT 16 04/18/2017 0853   ALKPHOS 73 01/29/2019 1048   ALKPHOS 90 04/18/2017 0853   BILITOT 0.6 01/29/2019 1048   BILITOT 0.3 01/29/2019 0855   BILITOT 0.63 04/18/2017 0853   GFRNONAA >60 02/04/2019 0330   GFRNONAA >60 01/29/2019 0855   GFRAA >60 02/04/2019 0330   GFRAA >60 01/29/2019 0855    INo results found for: SPEP, UPEP  Lab Results  Component Value Date   WBC 3.0 (L) 02/04/2019   NEUTROABS 1.3 (L) 02/02/2019   HGB 12.6 02/04/2019   HCT 35.3 (L) 02/04/2019   MCV 110.0 (H) 02/04/2019   PLT 237 02/04/2019      Chemistry      Component Value Date/Time   NA 137 02/04/2019 0330   NA 138 04/18/2017 0853   K 4.2 02/04/2019 0330   K 4.4 04/18/2017 0853   CL 103 02/04/2019 0330   CO2 25 02/04/2019 0330   CO2 29 04/18/2017 0853   BUN 17 02/04/2019 0330   BUN 16.6 04/18/2017 0853   CREATININE 0.82 02/04/2019 0330   CREATININE 0.77 01/29/2019 0855  CREATININE 0.8 04/18/2017 0853      Component Value Date/Time   CALCIUM 8.7 (L) 02/04/2019 0330   CALCIUM 9.2 04/18/2017 0853   ALKPHOS 73 01/29/2019 1048   ALKPHOS 90 04/18/2017 0853   AST 28 01/29/2019 1048   AST 16 01/29/2019 0855   AST 17 04/18/2017 0853   ALT 16 01/29/2019 1048   ALT 13 01/29/2019 0855   ALT 16 04/18/2017 0853   BILITOT 0.6 01/29/2019 1048   BILITOT 0.3 01/29/2019 0855   BILITOT 0.63 04/18/2017 0853      No components found for: [id number]  No results for input(s): INR in the last 168 hours.  Urinalysis    Component Value Date/Time   COLORURINE STRAW (A) 11/19/2016 1750   APPEARANCEUR CLEAR 11/19/2016 1750   LABSPEC 1.003 (L) 11/19/2016 1750   PHURINE 7.0 11/19/2016 1750   GLUCOSEU  NEGATIVE 11/19/2016 1750   HGBUR NEGATIVE 11/19/2016 1750   BILIRUBINUR NEGATIVE 11/19/2016 1750   BILIRUBINUR neg 02/16/2015 1318   KETONESUR NEGATIVE 11/19/2016 1750   PROTEINUR NEGATIVE 11/19/2016 1750   UROBILINOGEN 0.2 02/16/2015 1318   UROBILINOGEN 0.2 11/18/2013 1321   NITRITE NEGATIVE 11/19/2016 1750   LEUKOCYTESUR TRACE (A) 11/19/2016 1750     STUDIES: No results found.   ELIGIBLE FOR AVAILABLE RESEARCH PROTOCOL: no  ASSESSMENT: 79 y.o. Erica Young woman  (1) status post right breast upper outer quadrant lumpectomy and axillary lymph node dissection in 2007 for what appears to have been a T1 N0, stage IA invasive ductal carcinoma, treated adjuvantly with radiation (33 sessions)  METASTATIC DISEASE definitively documented Sept 2015 (2) bronchoscopic biopsy 01/07/2014 showed a low-grade mucinous breast cancer, strongly estrogen receptor positive, progesterone receptor positive, HER-2 negative; staging studies confirmed extensive hypermetabolic adenopathy, multiple bone lesions, likely early lung involvement, but no liver lesions.  (a) bone scan and chest CT 07/09/2016 shows stable disease.  (b) CA-27-29 is moderately informative  (c) bone scan and CT scan of the chest 04/24/2017 shows essentially stable disease  (3) on Arimidex between September 2015 and June 2017, with evidence of response; discontinued secondary to side effects  (a) bone density 04/10/2016 shows osteopenia with a T score of -2.1  (4) on monthly denosumab/Xgeva between 07/30/2014 and 10/04/2015, discontinued due to patient's concerns regarding osteonecrosis of the jaw  (a) discussed again October 2017, the patient adamantly refusing denosumab  (5) started Young 01/26/2016, stopped after 01/29/2019 dose at patient's request   (a) Palbociclib added at 75 mg M/W/F, beginning mid February 2018, also stopped OCT 2020  (b) chest CT scan obtained 12/02/2017 shows small but measurable growth of lung lesions;  bones are stable  (c) palbociclib dose increased to 75 mg daily 21/7 beginning 12/23/2017  (d) palbociclib and Young held after 01/29/2019 Young dose, which was associated with syncope.  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  Specifically she certainly would not want any kind of chemo, and she is concerned that "pills" like the treatments that she was receiving before would lower her immune system and make it more difficult for her in several ways.  She does not want any lab work either.  She does want to see me however.  What is bothering her is the sinus mass.  This was irradiated in the past.  She saw Dr. Lisbeth Renshaw then but since Dr. Lisbeth Renshaw was her husband radiation Dr. And her husband died she would prefer to see a different radiation doctor so we will set that up for her.  The question is whether any further treatment to that mass is possible and if so whether it would solve some of her symptoms which include is stuffed up left nostril area and some headaches  Otherwise she looks remarkably well and she certainly does not appear depressed.  She is grieving appropriately about her husband's recent death.  I gave her a copy of the healthcare power of attorney document and living will document and she will work on this.  At this point she is thinking of naming her son Erica Young as healthcare power of attorney and Erica Young (who is in [place]) as second, but she may reverse that.  She has lost a little weight.  We discussed nutrition and hydration issues.  We also discussed pandemic precautions and the upcoming vaccine.  She will see me again in May.  She knows to call for any other issue that may develop before the next visit  Total encounter time 33 minutes.Sarajane Jews C.  Jakeria Caissie, MD  05/12/19 11:46 AM Medical Oncology and Hematology [facility] [place], [place] [address] Tel. [phone]    Fax. [phone]   I, Wilburn Mylar, am acting as scribe for Dr. Virgie Dad. Analis Distler.  I, Lurline Del MD, have reviewed the above documentation for accuracy and completeness, and I agree with the above.   *Total Encounter Time as defined by the Centers for Medicare and Medicaid Services includes, in addition to the face-to-face time of a patient visit (documented in the note above) non-face-to-face time: obtaining and reviewing outside history, ordering and reviewing medications, tests or procedures, care coordination (communications with other health care professionals or caregivers) and documentation in the medical record.

## 2019-05-12 ENCOUNTER — Inpatient Hospital Stay: Payer: Medicare Other

## 2019-05-12 ENCOUNTER — Other Ambulatory Visit: Payer: Self-pay | Admitting: Oncology

## 2019-05-12 ENCOUNTER — Inpatient Hospital Stay (HOSPITAL_BASED_OUTPATIENT_CLINIC_OR_DEPARTMENT_OTHER): Payer: Medicare Other | Admitting: Oncology

## 2019-05-12 ENCOUNTER — Other Ambulatory Visit: Payer: Self-pay

## 2019-05-12 VITALS — BP 109/59 | HR 77 | Temp 98.3°F | Resp 18 | Ht 62.0 in | Wt 126.4 lb

## 2019-05-12 DIAGNOSIS — Z7189 Other specified counseling: Secondary | ICD-10-CM

## 2019-05-12 DIAGNOSIS — R634 Abnormal weight loss: Secondary | ICD-10-CM | POA: Diagnosis not present

## 2019-05-12 DIAGNOSIS — Z170 Estrogen receptor positive status [ER+]: Secondary | ICD-10-CM | POA: Diagnosis not present

## 2019-05-12 DIAGNOSIS — C50911 Malignant neoplasm of unspecified site of right female breast: Secondary | ICD-10-CM | POA: Diagnosis not present

## 2019-05-12 DIAGNOSIS — J3489 Other specified disorders of nose and nasal sinuses: Secondary | ICD-10-CM | POA: Diagnosis not present

## 2019-05-12 DIAGNOSIS — C50411 Malignant neoplasm of upper-outer quadrant of right female breast: Secondary | ICD-10-CM | POA: Diagnosis not present

## 2019-05-12 DIAGNOSIS — C771 Secondary and unspecified malignant neoplasm of intrathoracic lymph nodes: Secondary | ICD-10-CM | POA: Diagnosis not present

## 2019-05-12 DIAGNOSIS — C7800 Secondary malignant neoplasm of unspecified lung: Secondary | ICD-10-CM | POA: Diagnosis not present

## 2019-05-12 DIAGNOSIS — C7951 Secondary malignant neoplasm of bone: Secondary | ICD-10-CM

## 2019-05-12 DIAGNOSIS — Z87891 Personal history of nicotine dependence: Secondary | ICD-10-CM | POA: Diagnosis not present

## 2019-05-12 DIAGNOSIS — C7971 Secondary malignant neoplasm of right adrenal gland: Secondary | ICD-10-CM | POA: Diagnosis not present

## 2019-05-13 ENCOUNTER — Telehealth: Payer: Self-pay | Admitting: Oncology

## 2019-05-13 NOTE — Telephone Encounter (Signed)
In talk with patient regarding schedule

## 2019-05-14 ENCOUNTER — Other Ambulatory Visit: Payer: Self-pay | Admitting: Oncology

## 2019-05-18 ENCOUNTER — Other Ambulatory Visit: Payer: Self-pay | Admitting: Radiation Therapy

## 2019-05-18 NOTE — Progress Notes (Signed)
Radiation Oncology         [phone] ________________________________  Initial Outpatient Consultation  Name: Erica Young MRN: [id number]  Date: 05/20/2019  DOB: 06-15-1940  WU:[id number] Erica Beals, MD  Magrinat, Erica Dad, MD   REFERRING PHYSICIAN: Magrinat, Erica Dad, MD  DIAGNOSIS: The encounter diagnosis was Cancer of right breast, stage 4 ([place]).  Metastatic stage IV right breast cancer  HISTORY OF PRESENT ILLNESS::Erica Young is a 79 y.o. female who is accompanied by no one due to COVID-19 restrictions and husband recently passing away. The patient has an extensive history of metastatic right breast cancer that was first diagnosed in 2007. Patient underwent a right lumpectomy with sentinel lymph node biopsy followed by adjuvant radiation in [place].  In December of 2014, a CT of abdomen/pelvis incidentally found subcarinal and right hilar adenopathy. CT of chest on 05/05/2013 confirmed extensive mediastinal and right hilar lymphadenopathy with multiple pulmonary nodules. PET scan on 05/21/2013 showed hypermetabolic adenopathy in the right and left paratracheal area, the precarinal, subcarinal and right hilar areas without involvement of the liver. The lung nodules were not hypermetabolic. Finally, it showed a hypermetabolic focus on the thyroid gland, which was evaluated with neck ultrasound and biopsy on 06/02/2013 that showed a follicular lesion of undetermined significance.  PET scan on 12/28/2013 showed multiple bony metastases in addition to the adenopathy that was previously noted. The patient underwent bronchoscopy on 01/07/2014, which showed a low-grade mucinous invasive breast cancer, ER+ / PR+, Her2 negative.  The patient was started  On Anastrozole on 01/22/2014. Monthly Denosumab/Xgeva was added on 07/30/2014, which she tolerated well.  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 Results showed an enhancing bone lesion  on the right posterior parietal bone that was consistent with metastatic disease with epidural extension. It also showed a 28 mm enhancing mass of the left cribriform plate that was compatible with metastatic disease with intracranial extension.  She was treated with palliative radiation therapy under the care of Dr. Lisbeth Renshaw from 12/06/2014 - 12/17/2014.  CT of chest/abomen/pelvis on 10/03/2015 showed significant regression of disease as evidenced by regression of mediastinal and right hilar lymphadenopathy. Metastatic disease in the lungs also appeared slightly regressed, while bony lesions appeared essentially unchanged. There was one nodule in the right lower lobe that measured 6 mm and was slightly more apparent than on prior studies.   Patient decided to go off of the Anastrozole and Denosumab/Xgena in June of 2017.  Most recently, the patient presented to the [facility] on 01/29/2019 for Faslodex injection when she began to experience sudden left-sided weakness. CT of head showed no evidence of intracranial hemorrhage or acute demarcated cortical infarction. However, it did show a partially calcified 3.8 x 3.1 cm mass centered in the region of the left cribriform plate and left ethmoid sinuses. As seen on MRI of brain from 2016, there is still an erosion through the cribriform plate with intracranial extension that is at least 3.3 cm in craniocaudal dimension. Finally, it showed known right parietal bone metastasis.  CT of chest on 02/02/2019 showed mediastinal, right hilar, and right internal mammary adenopathy that was significantly unchanged from multiple prior studies. It also showed stable scattered pulmonary nodularity, stable right adrenal metastasis, and stable osseous metastatic disease. Finally, it showed a possible new lesion medially in the dome of the right hepatic lobe, potentially a metastasis.  Patient was since been under the care of Dr. Jana Hakim, whom she  last saw on 05/12/2019.  During that time, she refused any labs and/or chemotherapy treatments for the breast cancer. Patient is more-so concerned about the sinus mass.  PREVIOUS RADIATION THERAPY: Yes  2007: Adjuvant radiation therapy in [place] for breast cancer (total of 33 treatments)  Radiation treatment dates:  12/06/2014 - 12/17/2014  Site/dose:  The patient was treated to 2 separate target regions concurrently consisting of a metastasis within the ethmoid sinus as well as a metastasis within the right skull. Both of these areas were treated using a IMRT technique to a dose of 30 gray in 10 fractions.  PAST MEDICAL HISTORY:  Past Medical History:  Diagnosis Date  . Allergy   . Anxiety   . Bone cancer ([place]) mri 11/11/14   right parietal bone,left cribriform plate metastases  . Cancer [facility]) 2007   right breat ca  , lumpectomy and radiation tx (declined chemo and additional prophylactic meds)  . Cataract    both eyes  10/2016 Lt.     04/2017 Rt.   . CEREBROVASCULAR DISEASE 03/03/2010  . Diverticulitis   . DIVERTICULOSIS, COLON 03/03/2010  . Fatty liver 08/02/09   as per U/S done by [facility]  . Foramen ovale    positive bubble study  . GERD (gastroesophageal reflux disease)   . Headache(784.0)    she thinks sinus headaches  . History of cerebral artery stenosis    right middle  . HYPERLIPIDEMIA 03/03/2010  . Hypertension   . HYPOTHYROIDISM 03/03/2010   no longer on meds  . Internal hemorrhoid   . Low back pain   . Mastoiditis    noted on brain MRI  . Rib fracture   . Right leg pain   . Stroke [facility]) Oct. 2011   TIA  . Ulcer     PAST SURGICAL HISTORY: Past Surgical History:  Procedure Laterality Date  . BREAST LUMPECTOMY     right  . CATARACT EXTRACTION Left    10/2016   Rt 04/2017  . CHOLECYSTECTOMY    . COLONOSCOPY    . POLYPECTOMY     benign cecum  . SHOULDER SURGERY     right shoulder (dislocation)  . TONSILLECTOMY    . TUBAL LIGATION    . UPPER GASTROINTESTINAL  ENDOSCOPY    . VIDEO BRONCHOSCOPY WITH ENDOBRONCHIAL ULTRASOUND N/A 01/07/2014   Procedure: VIDEO BRONCHOSCOPY WITH ENDOBRONCHIAL ULTRASOUND;  Surgeon: Ivin Poot, MD;  Location: [facility] OR;  Service: Thoracic;  Laterality: N/A;    FAMILY HISTORY:  Family History  Problem Relation Age of Onset  . Heart disease Sister        cerebral vascular disease also  . Heart disease Brother        cerebral vascular disease also  . Heart disease Brother        cerebral vascular disease  . Heart disease Sister        cebreal vascular disease also  . Heart disease Sister        cebreal vascular diease also  . Heart disease Sister        cerebral vascular diease also  . Stomach cancer Father   . Colon cancer Neg Hx   . Esophageal cancer Neg Hx   . Rectal cancer Neg Hx   . Colon polyps Neg Hx   . Asthma Neg Hx     SOCIAL HISTORY:  Social History   Tobacco Use  . Smoking status: Former Smoker    Packs/day: 0.50  Years: 20.00    Pack years: 10.00    Types: Cigarettes    Start date: 91    Quit date: 05/01/1991    Years since quitting: 28.0  . Smokeless tobacco: Never Used  Substance Use Topics  . Alcohol use: No    Alcohol/week: 0.0 standard drinks  . Drug use: No    ALLERGIES:  Allergies  Allergen Reactions  . Ciprofloxacin Anaphylaxis    MEDICATIONS:  Current Outpatient Medications  Medication Sig Dispense Refill  . ALPRAZolam (XANAX) 0.25 MG tablet Take 1 tablet (0.25 mg total) by mouth 3 (three) times daily as needed for anxiety. 90 tablet 0  . aspirin EC 81 MG tablet Take 81 mg by mouth daily after breakfast.     . carvedilol (COREG) 12.5 MG tablet Take 1 tablet (12.5 mg total) by mouth 2 (two) times daily with a meal. 60 tablet 2  . Cholecalciferol (VITAMIN D PO) Take 1,000 Units by mouth 2 (two) times daily.    . Glucosamine 500 MG CAPS Take 500 mg by mouth daily.     Marland Kitchen loratadine (CLARITIN) 10 MG tablet Take 1 tablet (10 mg total) by mouth daily. 30 tablet 11  .  losartan (COZAAR) 25 MG tablet Take 0.5 tablets (12.5 mg total) by mouth daily. 30 tablet 2  . OVER THE COUNTER MEDICATION [place] for gas, takes twice daily    . Probiotic Product (PROBIOTIC DAILY PO) Take 1 tablet by mouth daily.     Marland Kitchen spironolactone (ALDACTONE) 25 MG tablet Take 0.5 tablets (12.5 mg total) by mouth daily. 30 tablet 2  . ondansetron (ZOFRAN ODT) 4 MG disintegrating tablet Take 1 tablet (4 mg total) by mouth every 8 (eight) hours as needed for nausea or vomiting. (Patient not taking: Reported on 05/20/2019) 20 tablet 0   No current facility-administered medications for this encounter.    REVIEW OF SYSTEMS:  A 10+ POINT REVIEW OF SYSTEMS WAS OBTAINED including neurology, dermatology, psychiatry, cardiac, respiratory, lymph, extremities, GI, GU, musculoskeletal, constitutional, reproductive, HEENT.    PHYSICAL EXAM:  height is '5\' 2"'$  (1.575 m) and weight is 127 lb 12.8 oz (58 kg). Her temporal temperature is 98.5 F (36.9 C). Her blood pressure is 118/72 and her pulse is 72. Her respiration is 18 and oxygen saturation is 99%.   General: Alert and oriented, in no acute distress HEENT: Head is normocephalic. Extraocular movements are intact. Oropharynx is clear.   erythematous mass is obstructing the left nasal cavity.  No active bleeding at this time. Neck: Neck is supple, no palpable cervical or supraclavicular lymphadenopathy. Heart: Regular in rate and rhythm with no murmurs, rubs, or gallops. Chest: Clear to auscultation bilaterally, with no rhonchi, wheezes, or rales. Abdomen: Soft, nontender, nondistended, with no rigidity or guarding. Extremities: No cyanosis or edema. Lymphatics: see Neck Exam Skin: No concerning lesions. Musculoskeletal: symmetric strength and muscle tone throughout. Neurologic: Cranial nerves II through XII are grossly intact. No obvious focalities. Speech is fluent. Coordination is intact. Psychiatric: Judgment and insight are intact. Affect is  appropriate.   ECOG = 1    LABORATORY DATA:  Lab Results  Component Value Date   WBC 3.0 (L) 02/04/2019   HGB 12.6 02/04/2019   HCT 35.3 (L) 02/04/2019   MCV 110.0 (H) 02/04/2019   PLT 237 02/04/2019   NEUTROABS 1.3 (L) 02/02/2019   Lab Results  Component Value Date   NA 137 02/04/2019   K 4.2 02/04/2019   CL 103 02/04/2019  CO2 25 02/04/2019   GLUCOSE 112 (H) 02/04/2019   CREATININE 0.82 02/04/2019   CALCIUM 8.7 (L) 02/04/2019      RADIOGRAPHY:   Sinuses/Orbits: Large mass centered in the left nasal cavity with cribriform plate extension and dural distortion. Mass measures up to 5 cm craniocaudal and obstructs left-sided sinuses. There is extensive bony erosion at the level of the mass, which has significantly enlarged since 2016. No visible brain invasion.  IMPRESSION: 1. 5 cm left nasal cavity mass extending to the cribriform plate and causing postobstructive sinusitis. Given metastatic disease elsewhere has responded in this lesion has enlarged, favor a primary nasal mass rather than metastatic disease. 2. No worrisome findings at the regressed right parietal bone metastasis. 3. No evidence of metastasis to the brain.  IMPRESSION: Metastatic stage IV right breast cancer  Patient is becoming symptomatic from her nasal cavity mass.  Reports having some difficulty breathing through her nose but no active bleeding at this time or significant pain.  Discussed with the patient that her most recent MRI images are consistent with the primary nasal mass rather than metastatic disease although the only way to know for sure will be to biopsy this area.  She has decided against biopsy and was recently scheduled to see Dr. Radene Journey for this issue.  Options at this time to consider would be continue close follow-up with medical oncology and recommendations for therapy as above.  Another option to consider would be retreating the left nasal cavity mass given its continued  progression.  Presently the patient is having little symptoms from this area at this time.  Treating this area will be somewhat complicated given her previous treatment to this area several years ago with increased risk of complications.  Discussed with her that she potentially could become blind in the left eye with additional radiation therapy,  but with continued tumor progression  this could be an issue also.    PLAN: The patient is undecided whether she would like to proceed with treatment for her nasal cavity mass.  I have asked her to call me if she decides to proceed with treatment to this area otherwise continue close follow-up with Dr. Jana Hakim.    ------------------------------------------------  Blair Promise, PhD, MD  This document serves as a record of services personally performed by Gery Pray, MD. It was created on his behalf by Clerance Lav, a trained medical scribe. The creation of this record is based on the scribe's personal observations and the provider's statements to them. This document has been checked and approved by the attending provider.

## 2019-05-20 ENCOUNTER — Ambulatory Visit
Admission: RE | Admit: 2019-05-20 | Discharge: 2019-05-20 | Disposition: A | Discharge status: Home / Self Care | Payer: Medicare Other | Source: Ambulatory Visit | Attending: Radiation Oncology | Admitting: Radiation Oncology

## 2019-05-20 ENCOUNTER — Encounter: Payer: Self-pay | Admitting: Radiation Oncology

## 2019-05-20 ENCOUNTER — Other Ambulatory Visit: Payer: Self-pay

## 2019-05-20 VITALS — BP 118/72 | HR 72 | Temp 98.5°F | Resp 18 | Ht 62.0 in | Wt 127.8 lb

## 2019-05-20 DIAGNOSIS — I10 Essential (primary) hypertension: Secondary | ICD-10-CM | POA: Diagnosis not present

## 2019-05-20 DIAGNOSIS — R0600 Dyspnea, unspecified: Secondary | ICD-10-CM | POA: Insufficient documentation

## 2019-05-20 DIAGNOSIS — F419 Anxiety disorder, unspecified: Secondary | ICD-10-CM | POA: Insufficient documentation

## 2019-05-20 DIAGNOSIS — C7951 Secondary malignant neoplasm of bone: Secondary | ICD-10-CM | POA: Insufficient documentation

## 2019-05-20 DIAGNOSIS — Z8673 Personal history of transient ischemic attack (TIA), and cerebral infarction without residual deficits: Secondary | ICD-10-CM | POA: Insufficient documentation

## 2019-05-20 DIAGNOSIS — Z7982 Long term (current) use of aspirin: Secondary | ICD-10-CM | POA: Insufficient documentation

## 2019-05-20 DIAGNOSIS — Z79899 Other long term (current) drug therapy: Secondary | ICD-10-CM | POA: Diagnosis not present

## 2019-05-20 DIAGNOSIS — E785 Hyperlipidemia, unspecified: Secondary | ICD-10-CM | POA: Diagnosis not present

## 2019-05-20 DIAGNOSIS — R220 Localized swelling, mass and lump, head: Secondary | ICD-10-CM | POA: Diagnosis not present

## 2019-05-20 DIAGNOSIS — J349 Unspecified disorder of nose and nasal sinuses: Secondary | ICD-10-CM | POA: Diagnosis not present

## 2019-05-20 DIAGNOSIS — C7971 Secondary malignant neoplasm of right adrenal gland: Secondary | ICD-10-CM | POA: Diagnosis not present

## 2019-05-20 DIAGNOSIS — C50411 Malignant neoplasm of upper-outer quadrant of right female breast: Secondary | ICD-10-CM

## 2019-05-20 DIAGNOSIS — C50911 Malignant neoplasm of unspecified site of right female breast: Secondary | ICD-10-CM | POA: Diagnosis not present

## 2019-05-20 DIAGNOSIS — Z800 Family history of malignant neoplasm of digestive organs: Secondary | ICD-10-CM | POA: Diagnosis not present

## 2019-05-20 DIAGNOSIS — E039 Hypothyroidism, unspecified: Secondary | ICD-10-CM | POA: Insufficient documentation

## 2019-05-20 DIAGNOSIS — K219 Gastro-esophageal reflux disease without esophagitis: Secondary | ICD-10-CM | POA: Insufficient documentation

## 2019-05-20 DIAGNOSIS — Z923 Personal history of irradiation: Secondary | ICD-10-CM | POA: Diagnosis not present

## 2019-05-20 DIAGNOSIS — Z87891 Personal history of nicotine dependence: Secondary | ICD-10-CM | POA: Diagnosis not present

## 2019-05-20 DIAGNOSIS — Z853 Personal history of malignant neoplasm of breast: Secondary | ICD-10-CM | POA: Diagnosis not present

## 2019-05-20 NOTE — Progress Notes (Signed)
Histology and Location of Primary Cancer:  Estrogen receptor positive stage IV breast cancer   Location(s) of Symptomatic tumor(s): left nasal cavity mass with cribriform plate extension first noted on brain MRI 11/11/2014 (2.8 cm), increased to 5 cm on brain MRI 01/30/2019  Past/Anticipated chemotherapy by medical oncology, if any: Per Dr. Jana Hakim 05/12/19:  PLAN Marlena tells me that she does not want any treatment for the breast cancer.  Specifically she certainly would not want any kind of chemo, and she is concerned that "pills" like the treatments that she was receiving before would lower her immune system and make it more difficult for her in several ways.  She does not want any lab work either.  She does want to see me however.  What is bothering her is the sinus mass.  This was irradiated in the past.  She saw Dr. Lisbeth Renshaw then but since Dr. Lisbeth Renshaw was her husband radiation Dr. And her husband died she would prefer to see a different radiation doctor so we will set that up for her.  The question is whether any further treatment to that mass is possible and if so whether it would solve some of her symptoms which include is stuffed up left nostril area and some headaches  Otherwise she looks remarkably well and she certainly does not appear depressed.  She is grieving appropriately about her husband's recent death.  I gave her a copy of the healthcare power of attorney document and living will document and she will work on this.  At this point she is thinking of naming her son Shanon Brow as healthcare power of attorney and Delfino Lovett (who is in [place]) as second, but she may reverse that.  She has lost a little weight.  We discussed nutrition and hydration issues.  We also discussed pandemic precautions and the upcoming vaccine.  She will see me again in May.  She knows to call for any other issue that may develop before the next visit    Pain on a scale of 0-10 is:  Pt denies c/o pain but  reports she is "stressed out" by being here. Pt became tearful, stating her husband passed away 19-Mar-2019 and she doesn't think "anything matters" to her anymore.    Ambulatory status? Walker? Wheelchair?: steady gait without assistive device  SAFETY ISSUES: Prior radiation? Radiation treatment dates:   8/08/2016through 12/17/2014   Site/dose:   The patient was treated to 2 separate target regions concurrently consisting o fa metastasis within the ethmoid sinus as well as a metastasis within the right skull. Both of these areas were treated using a IMRT technique to a dose of 30 gray in 10 fractions. PREVIOUS RADIATION THERAPY: Yes, 2007 in [place] for breast cancer   Pacemaker/ICD? No  Possible current pregnancy? No  Is the patient on methotrexate? No  Additional Complaints / other details:  Pt presents today for initial consult with Dr. Sondra Come. Pt is former pt of Dr. Lisbeth Renshaw but does not want to see him. Pt is anxious and "stressed" about consult.   BP 118/72 (BP Location: Right Arm, Patient Position: Sitting)   Pulse 72   Temp 98.5 F (36.9 C) (Temporal)   Resp 18   Ht 5\' 2"  (1.575 m)   Wt 127 lb 12.8 oz (58 kg)   SpO2 99%   BMI 23.37 kg/m   Wt Readings from Last 3 Encounters:  05/20/19 127 lb 12.8 oz (58 kg)  05/12/19 126 lb 6.4 oz (57.3 kg)  03/25/19  128 lb 14.4 oz (58.5 kg)   Loma Sousa, RN BSN

## 2019-05-20 NOTE — Patient Instructions (Signed)
Coronavirus (COVID-19) Are you at risk?  Are you at risk for the Coronavirus (COVID-19)?  To be considered HIGH RISK for Coronavirus (COVID-19), you have to meet the following criteria:  . Traveled to [place], [place], [place], [place] or [place]; or in the [place] to [place], [place], [place], or [place]; and have fever, cough, and shortness of breath within the last 2 weeks of travel OR . Been in close contact with a person diagnosed with COVID-19 within the last 2 weeks and have fever, cough, and shortness of breath . IF YOU DO NOT MEET THESE CRITERIA, YOU ARE CONSIDERED LOW RISK FOR COVID-19.  What to do if you are HIGH RISK for COVID-19?  . If you are having a medical emergency, call 911. . Seek medical care right away. Before you go to a doctor's office, urgent care or emergency department, call ahead and tell them about your recent travel, contact with someone diagnosed with COVID-19, and your symptoms. You should receive instructions from your physician's office regarding next steps of care.  . When you arrive at healthcare provider, tell the healthcare staff immediately you have returned from visiting [place], [place], [place], [place] or [place]; or traveled in the [place] to [place], [place], [place], or [place]; in the last two weeks or you have been in close contact with a person diagnosed with COVID-19 in the last 2 weeks.   . Tell the health care staff about your symptoms: fever, cough and shortness of breath. . After you have been seen by a medical provider, you will be either: o Tested for (COVID-19) and discharged home on quarantine except to seek medical care if symptoms worsen, and asked to  - Stay home and avoid contact with others until you get your results (4-5 days)  - Avoid travel on public transportation if possible (such as bus, train, or airplane) or o Sent to the Emergency Department by EMS for evaluation, COVID-19 testing, and possible  admission depending on your condition and test results.  What to do if you are LOW RISK for COVID-19?  Reduce your risk of any infection by using the same precautions used for avoiding the common cold or flu:  . Wash your hands often with soap and warm water for at least 20 seconds.  If soap and water are not readily available, use an alcohol-based hand sanitizer with at least 60% alcohol.  . If coughing or sneezing, cover your mouth and nose by coughing or sneezing into the elbow areas of your shirt or coat, into a tissue or into your sleeve (not your hands). . Avoid shaking hands with others and consider head nods or verbal greetings only. . Avoid touching your eyes, nose, or mouth with unwashed hands.  . Avoid close contact with people who are sick. . Avoid places or events with large numbers of people in one location, like concerts or sporting events. . Carefully consider travel plans you have or are making. . If you are planning any travel outside or inside the [place], visit the CDC's Travelers' Health webpage for the latest health notices. . If you have some symptoms but not all symptoms, continue to monitor at home and seek medical attention if your symptoms worsen. . If you are having a medical emergency, call 911.   ADDITIONAL HEALTHCARE OPTIONS FOR PATIENTS  [facility] / e-Visit: [email or web address]         [facility]: [phone]  [facility]: [phone]                   [facility]: [phone]   

## 2019-05-26 ENCOUNTER — Telehealth: Payer: Self-pay | Admitting: Internal Medicine

## 2019-05-26 ENCOUNTER — Ambulatory Visit: Payer: Self-pay | Admitting: *Deleted

## 2019-05-26 ENCOUNTER — Inpatient Hospital Stay: Payer: Medicare Other

## 2019-05-26 ENCOUNTER — Encounter: Payer: Self-pay | Admitting: Radiation Oncology

## 2019-05-26 NOTE — Telephone Encounter (Signed)
If diarrhea and cramping will need VV. OK to schedule next available.

## 2019-05-26 NOTE — Telephone Encounter (Signed)
Could this be related to cancer treatment? Please advise

## 2019-05-26 NOTE — Telephone Encounter (Signed)
Pt called back in to check on her call from earlier. Informed pt that the nurse is waiting on advice from [place] and she will be in touch with her when she hears back from her. Pt can be contacted at [phone]

## 2019-05-26 NOTE — Telephone Encounter (Signed)
The patient called to make an appointment to be seen by Dr. Jerilee Hoh for:  last week diarrhea/this week no diarrhea/now waking up with cramp like full of gas/today keep going to the bathroom/losing weight/so cold/so tired.   I told her that we will have to schedule a virtual visit and she is refusing a virtual and wants to be seen by Dr. Jerilee Hoh since she is her provider.  I advise the patient that Jerilee Hoh nurse will give her a call back.  Please Advise

## 2019-05-26 NOTE — Progress Notes (Signed)
  Radiation Oncology         [phone]) [phone] ________________________________  Name: Erica Young MRN: [id number]  Date: 05/26/2019  DOB: 1941-04-15  Follow-Up Visit Note-phone contact  CC: Isaac Bliss, Rayford Halsted, MD  No ref. provider found   Diagnosis:  Metastatic  right breast cancer    Narrative:   Earlier today I spoke with Erica Young  concerning her management.  After careful consideration the patient has decided not to pursue palliative radiation therapy for her left nasal cavity mass. She will continue to follow-up with medical oncology and is scheduled to see Dr. Jana Hakim in May of this year. She will reconsider consider palliative radiation therapy if she develops more symptoms.                        ____________________________________ Gery Pray, MD

## 2019-05-27 ENCOUNTER — Other Ambulatory Visit: Payer: Self-pay | Admitting: *Deleted

## 2019-05-27 ENCOUNTER — Other Ambulatory Visit: Payer: Self-pay

## 2019-05-27 NOTE — Patient Outreach (Signed)
  [facility]) Care Management  05/27/2019  Erica Young [birth year] [id number]   [facility]N follow up attempt forTelephone Assessment/Screen-complex  Erica Young was referred to [facility] on 02/27/19 Referral Source:THN PAC Liaison, [facility] Referral Reason:dx of Heart failure and cancer with mets,HTN,CVA, anxiety, FTT Please assign to [facility] CMfor care coordination and complex case management and [facility] Social Worker for meal assistance. Member to dc from [facility] on 02/27/19 with [facility]. Will be home alone with family checking in. Followed closely by [place] is involved in Erica. Young's care. [place] Medicare supplement  Last contact was made to Erica Young in December 2020 after various attempts were unsuccessful. She voiced concerns with difficulty in using her phone and not being wise with technology. During that time she was preferring monthly [facility] outreach   Outreach attempt made on today successful but again  She is able to verify HIPAA (date of birth (DOB) and address)  She reports "not good" when [facility] RN CM inquired how she was doing and "I try not to think about these things" when [facility] RN CM inquired about an elaboration of her statement. She reports today she is home alone and attempting to cook for herself. She states  There: really is not anything, no one can help me with with this" Erica Young informs [facility] RN CM as she did in December 2020 that it is her meal time and she will return a call to [place] CM at another time.[facility] RN CM inquired about case closure or quarterly contact and she reports preference to call [facility] RN CM back prn.  She is able to verbalize the correct number for [facility] RN CM   Plan: [facility] RN CM scheduled this patient for another call attempt within 91-98 business days unless she changes her mind if she returns a call to [facility] RN CM   Routed note to MD  [facility] CM  Care Plan Problem One     Most Recent Value  Care Plan Problem One  Breast cancer wtih bone mets home care needs  Role Documenting the Problem One  Care Management Telephonic Coordinator  Care Plan for Problem One  Active  THN Long Term Goal   over the next 60 days patient will receive knowledge of services and resources for home care of breast cancer with bone mets  THN Long Term Goal Start Date  05/27/19  Interventions for Problem One Long Term Goal  Pt unable to speak today   THN CM Short Term Goal #1   over the next 30 days patient will be offered [facility] SW if she chooses for grief related to the loss of her husband  THN CM Short Term Goal #1 Start Date  03/11/19  [facility] CM Short Term Goal #1 Met Date  03/16/19      [name], RN, BSN, [place] Coordinator Office number [phone] Mobile number [phone]  Main [facility]N number [phone] Fax number [phone]

## 2019-05-27 NOTE — Telephone Encounter (Signed)
Spoke with patient. Patient reports the diarrhea has subsided, but she is having multiple bowel movements daily. Patient verbalizes she is having to change pants almost 6 times a day. Patient reports she has been on multiple antibiotics and thinks it could be related to the anitbiotics. Patient declined to have a VV at this time and will call back if not better.

## 2019-05-27 NOTE — Telephone Encounter (Signed)
Thank you :)

## 2019-05-29 ENCOUNTER — Telehealth (INDEPENDENT_AMBULATORY_CARE_PROVIDER_SITE_OTHER): Payer: Medicare Other | Admitting: Internal Medicine

## 2019-05-29 ENCOUNTER — Other Ambulatory Visit: Payer: Self-pay

## 2019-05-29 DIAGNOSIS — R197 Diarrhea, unspecified: Secondary | ICD-10-CM | POA: Diagnosis not present

## 2019-05-29 MED ORDER — SACCHAROMYCES BOULARDII 250 MG PO CAPS: 250.0000 mg | ORAL_CAPSULE | Freq: Two times a day (BID) | ORAL | 2 refills | Status: DC

## 2019-05-29 NOTE — Progress Notes (Signed)
Virtual Visit via Telephone Note  I connected with Erica Young on 05/29/19 at  2:30 PM EST by telephone and verified that I am speaking with the correct person using two identifiers.   I discussed the limitations, risks, security and privacy concerns of performing an evaluation and management service by telephone and the availability of in person appointments. I also discussed with the patient that there may be a patient responsible charge related to this service. The patient expressed understanding and agreed to proceed.  Location patient: home Location provider: work office Participants present for the call: patient, provider Patient did not have a visit in the prior 7 days to address this/these issue(s).   History of Present Illness:  She has scheduled this visit to discuss some acute issues.  Last week she had stomach pains and diarrhea and started taking daily Imodium.  Over the weekend the diarrhea abated, now she is having cramps with the gas that wakes her up in the middle of the night.  She has not had any fevers, nausea or vomiting.  She thinks that she might have got floor and balance due to multiple courses of antibiotics for her presumed sinus infections.  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  She was referred to ENT and biopsy was recommended, she has refused.  She also saw a radiation oncology and was concerned due to high risk of complications including blindness.  She has elected to not pursue further treatment.  She wants to know more about palliative care.   Observations/Objective: Patient sounds cheerful and well on the phone. I do not appreciate any increased work of breathing. Speech and thought processing are grossly intact. Patient reported vitals: none reported   Current Outpatient Medications:  .  ALPRAZolam (XANAX) 0.25 MG tablet, Take 1 tablet (0.25 mg total) by mouth 3 (three) times daily as needed for anxiety., Disp: 90 tablet, Rfl: 0 .   aspirin EC 81 MG tablet, Take 81 mg by mouth daily after breakfast. , Disp: , Rfl:  .  carvedilol (COREG) 12.5 MG tablet, Take 1 tablet (12.5 mg total) by mouth 2 (two) times daily with a meal., Disp: 60 tablet, Rfl: 2 .  Cholecalciferol (VITAMIN D PO), Take 1,000 Units by mouth 2 (two) times daily., Disp: , Rfl:  .  Glucosamine 500 MG CAPS, Take 500 mg by mouth daily. , Disp: , Rfl:  .  loratadine (CLARITIN) 10 MG tablet, Take 1 tablet (10 mg total) by mouth daily., Disp: 30 tablet, Rfl: 11 .  losartan (COZAAR) 25 MG tablet, Take 0.5 tablets (12.5 mg total) by mouth daily., Disp: 30 tablet, Rfl: 2 .  ondansetron (ZOFRAN ODT) 4 MG disintegrating tablet, Take 1 tablet (4 mg total) by mouth every 8 (eight) hours as needed for nausea or vomiting. (Patient not taking: Reported on 05/20/2019), Disp: 20 tablet, Rfl: 0 .  OVER THE COUNTER MEDICATION, Beno for gas, takes twice daily, Disp: , Rfl:  .  Probiotic Product (PROBIOTIC DAILY PO), Take 1 tablet by mouth daily. , Disp: , Rfl:  .  saccharomyces boulardii (FLORASTOR) 250 MG capsule, Take 1 capsule (250 mg total) by mouth 2 (two) times daily., Disp: 60 capsule, Rfl: 2 .  spironolactone (ALDACTONE) 25 MG tablet, Take 0.5 tablets (12.5 mg total) by mouth daily., Disp: 30 tablet, Rfl: 2  Review of Systems:  Constitutional: Denies fever, chills, diaphoresis. HEENT: Denies photophobia, eye pain, redness, hearing loss, ear pain, congestion, sore throat, rhinorrhea,  sneezing, mouth sores, trouble swallowing, neck pain, neck stiffness and tinnitus.   Respiratory: Denies SOB, DOE, cough, chest tightness,  and wheezing.   Cardiovascular: Denies chest pain, palpitations and leg swelling.  Gastrointestinal: Denies nausea, vomiting, constipation, blood in stool and abdominal distention.  Genitourinary: Denies dysuria, urgency, frequency, hematuria, flank pain and difficulty urinating.  Endocrine: Denies: hot or cold intolerance, sweats, changes in hair or nails,  polyuria, polydipsia. Musculoskeletal: Denies myalgias, back pain, joint swelling, arthralgias and gait problem.  Skin: Denies pallor, rash and wound.  Neurological: Denies dizziness, seizures, syncope, weakness, light-headedness, numbness and headaches.  Hematological: Denies adenopathy. Easy bruising, personal or family bleeding history  Psychiatric/Behavioral: Denies suicidal ideation, mood changes, confusion, nervousness, sleep disturbance and agitation   Assessment and Plan:  Diarrhea, unspecified type -Try florastor.  I have encouraged her to look into palliative care as suggested especially if she will not pursue diagnosis and treatment of her nasal cavity mass and breast cancer    I discussed the assessment and treatment plan with the patient. The patient was provided an opportunity to ask questions and all were answered. The patient agreed with the plan and demonstrated an understanding of the instructions.   The patient was advised to call back or seek an in-person evaluation if the symptoms worsen or if the condition fails to improve as anticipated.  I provided 22 minutes of non-face-to-face time during this encounter.   Lelon Frohlich, MD [facility]

## 2019-06-02 ENCOUNTER — Telehealth: Payer: Self-pay | Admitting: Internal Medicine

## 2019-06-02 NOTE — Telephone Encounter (Signed)
I would not change a thing. Probiotics can take 10-14 days before starting to work

## 2019-06-02 NOTE — Telephone Encounter (Signed)
Pt called stating the florastor medication not working. Still having diarrhea. Pt would like to know what else to do.

## 2019-06-02 NOTE — Telephone Encounter (Signed)
Please advise. Patient had a telephone visit 05/29/19

## 2019-06-03 NOTE — Telephone Encounter (Signed)
Spoke with patient and she states that the new medication makes her "feel sick". Telephone visit scheduled.

## 2019-06-03 NOTE — Telephone Encounter (Signed)
Pt is calling back wanting to speak with someone b/c she states that she is very sick and need to speak with someone.  I will send the call to [facility]

## 2019-06-04 ENCOUNTER — Encounter: Payer: Self-pay | Admitting: Family Medicine

## 2019-06-04 ENCOUNTER — Other Ambulatory Visit: Payer: Self-pay

## 2019-06-04 ENCOUNTER — Telehealth (INDEPENDENT_AMBULATORY_CARE_PROVIDER_SITE_OTHER): Payer: Medicare Other | Admitting: Family Medicine

## 2019-06-04 DIAGNOSIS — R197 Diarrhea, unspecified: Secondary | ICD-10-CM

## 2019-06-04 NOTE — Progress Notes (Signed)
Virtual Visit via Telephone Note  I connected with Erica Young on 06/04/19 at 10:00 AM EST by telephone and verified that I am speaking with the correct person using two identifiers.   I discussed the limitations, risks, security and privacy concerns of performing an evaluation and management service by telephone and the availability of in person appointments. I also discussed with the patient that there may be a patient responsible charge related to this service. The patient expressed understanding and agreed to proceed.  Location patient: home Location provider: work or home office Participants present for the call: patient, provider Patient did not have a visit in the prior 7 days to address this/these issue(s).   History of Present Illness:  Diarrhea: -started about 1.5 weeks ago -symptoms include intermittent diarrhea, 2-3 episodes of loose to watery stools for 1-2 days, then ok for a few days, then recurs -denies abd pain, blood in the stools, nausea, vomiting, fevers, resp symptoms, malaise -no sick exposures -reports she did take antibiotics for a tooth infection recently 3 weeks ago -saw her PCP last week for this and started a probiotic - but she did not take it because had immediate diarrhea after one dose -not taking imodium, but it helped when she took it in the past  Observations/Objective: Patient sounds cheerful and well on the phone. I do not appreciate any SOB. Speech and thought processing are grossly intact. Patient reported vitals:  Assessment and Plan:  Diarrhea, unspecified type  -we discussed possible serious and likely etiologies, options for evaluation and workup, limitations of telemedicine visit vs in person visit, treatment, treatment risks and precautions. Pt prefers to treat via telemedicine empirically rather then risking or undertaking an in person visit at this moment. Suspect recovering from mild gastroenteritis vs imbalance from the antibiotic vs  other. Discussed possibility of C. Diff, other bowel infectious etiologies and other causes. Denies greater then 3 episodes per day or symptoms lasting longer than 1-2 days before normal for a few days, denies foul smelling stool. She opted to try avoiding dairy, sticking with probiotic for longer, imodium prn but no more than 2-3 times daily and follow up in 1-2 weeks to see how she is doing. Patient agrees to seek prompt in person care if worsening, new symptoms arise, or if is not improving with treatment.  Follow Up Instructions:  I did not refer this patient for an OV in the next 24 hours for this/these issue(s).  I discussed the assessment and treatment plan with the patient. The patient was provided an opportunity to ask questions and all were answered. The patient agreed with the plan and demonstrated an understanding of the instructions.   The patient was advised to call back or seek an in-person evaluation if the symptoms worsen or if the condition fails to improve as anticipated.  I provided 23 minutes of non-face-to-face time during this encounter.   Lucretia Kern, DO

## 2019-06-05 ENCOUNTER — Other Ambulatory Visit: Payer: Self-pay | Admitting: Radiation Therapy

## 2019-06-05 ENCOUNTER — Telehealth: Payer: Self-pay | Admitting: Internal Medicine

## 2019-06-05 MED ORDER — CARVEDILOL 12.5 MG PO TABS: 12.5000 mg | ORAL_TABLET | Freq: Two times a day (BID) | ORAL | 1 refills | Status: AC

## 2019-06-05 NOTE — Telephone Encounter (Signed)
Patient is needing a refill on  carvedilol (COREG) 12.5 MG tablet  The patient and the pharmacy has been trying to contact us since Tuesday and no response.  The patient only has one pill left and this is an important medication that the patient is concerned about running out of.  Please Advise

## 2019-06-05 NOTE — Addendum Note (Signed)
Addended by: Westley Hummer B on: 06/05/2019 11:00 AM   Modules accepted: Orders

## 2019-06-09 ENCOUNTER — Other Ambulatory Visit: Payer: Self-pay | Admitting: Adult Health

## 2019-06-09 ENCOUNTER — Inpatient Hospital Stay: Payer: Medicare Other | Attending: Adult Health

## 2019-06-09 DIAGNOSIS — C50911 Malignant neoplasm of unspecified site of right female breast: Secondary | ICD-10-CM

## 2019-06-09 DIAGNOSIS — C50411 Malignant neoplasm of upper-outer quadrant of right female breast: Secondary | ICD-10-CM

## 2019-06-09 DIAGNOSIS — R531 Weakness: Secondary | ICD-10-CM

## 2019-06-09 DIAGNOSIS — Z170 Estrogen receptor positive status [ER+]: Secondary | ICD-10-CM

## 2019-06-09 DIAGNOSIS — C7951 Secondary malignant neoplasm of bone: Secondary | ICD-10-CM

## 2019-06-11 ENCOUNTER — Telehealth: Payer: Self-pay | Admitting: Internal Medicine

## 2019-06-11 ENCOUNTER — Telehealth: Payer: Self-pay | Admitting: Licensed Clinical Social Worker

## 2019-06-11 NOTE — Telephone Encounter (Signed)
Palliative Care SW left a vm with patient to schedule a visit.

## 2019-06-11 NOTE — Telephone Encounter (Signed)
Patient needs a refill on Alprazolam. Patient states she usually gets 90 days.  Patient has 3 days left.  Pharmacy:  [facility] that is in her chart she states

## 2019-06-11 NOTE — Telephone Encounter (Signed)
Rx called in and patient is aware 

## 2019-06-18 ENCOUNTER — Other Ambulatory Visit: Payer: Medicare Other | Admitting: Licensed Clinical Social Worker

## 2019-06-18 ENCOUNTER — Encounter: Payer: Self-pay | Admitting: Family Medicine

## 2019-06-18 ENCOUNTER — Other Ambulatory Visit: Payer: Self-pay

## 2019-06-18 ENCOUNTER — Telehealth (INDEPENDENT_AMBULATORY_CARE_PROVIDER_SITE_OTHER): Payer: Medicare Other | Admitting: Family Medicine

## 2019-06-18 DIAGNOSIS — Z515 Encounter for palliative care: Secondary | ICD-10-CM

## 2019-06-18 DIAGNOSIS — F439 Reaction to severe stress, unspecified: Secondary | ICD-10-CM | POA: Diagnosis not present

## 2019-06-18 DIAGNOSIS — R197 Diarrhea, unspecified: Secondary | ICD-10-CM | POA: Diagnosis not present

## 2019-06-18 DIAGNOSIS — K5900 Constipation, unspecified: Secondary | ICD-10-CM | POA: Diagnosis not present

## 2019-06-18 NOTE — Progress Notes (Signed)
Virtual Visit via Telephone Note  I connected with Erica Young on 06/18/19 at 10:00 AM EST by telephone and verified that I am speaking with the correct person using two identifiers.   I discussed the limitations, risks, security and privacy concerns of performing an evaluation and management service by telephone and the availability of in person appointments. I also discussed with the patient that there may be a patient responsible charge related to this service. The patient expressed understanding and agreed to proceed.  Location patient: home Location provider: work or home office Participants present for the call: patient, provider Patient did not have a visit in the prior 7 days to address this/these issue(s).   History of Present Illness:  Acute visit for f/u diarrhea: -reports the diarrhea stopped -but then was going more frequently but normal bowels -last BM was 2 days ago, now is stopped up some - has swung the other way -she has constipation at baseline and now is constipated again, reports was taking stool softeners twice daily prior to the diarrhea -denies fevers, bleeding, pain -she has been very stressed out by the diarrhea and not that she is back to constipation she is stressed about this, she is also dealing with a nose tumor (sees specialist for this) and reports this is stressful and draining as well -PCP is treating her for anxiety -she wants help with diet for her bowels  Observations/Objective: Patient sounds cheerful and well on the phone. I do not appreciate any SOB. Speech and thought processing are grossly intact. Patient reported vitals:  Assessment and Plan: -we discussed possible serious and likely etiologies, options for evaluation and workup, limitations of telemedicine visit vs in person visit, treatment, treatment risks and precautions. Pt prefers to treat via telemedicine empirically rather then risking or undertaking an in person visit at this  moment. Patient agrees to seek prompt in person care if worsening, new symptoms arise, or if is not improving with treatment.  Diarrhea, unspecified type -I am glad this has resolved, may have been related to the abx use  Constipation, unspecified constipation type -seems from her report this is something she struggles with chronically -advised could stop the probiotic now that diarrhea is resolved, sometimes helps constipation - but for some makes constipation worse. She has stopped the imodium -advised daily fiber supplement in water in the morning and adding mirilax if not successful over the next 1-2 days -advised if the constipation is worsening, different from her baseline, not resolving with treatment or new concerns she needs to be seen in person or may need to see GI  Stress -supported and counseled, she is seeing PCP for management -advised follow up promptly if worsening or not improving   Follow Up Instructions:  I did not refer this patient for an OV in the next 24 hours for this/these issue(s).  I discussed the assessment and treatment plan with the patient. The patient was provided an opportunity to ask questions and all were answered. The patient agreed with the plan and demonstrated an understanding of the instructions.   The patient was advised to call back or seek an in-person evaluation if the symptoms worsen or if the condition fails to improve as anticipated.  I provided  23 minutes of non-face-to-face time during this encounter.   Lucretia Kern, DO

## 2019-06-18 NOTE — Progress Notes (Signed)
COMMUNITY PALLIATIVE CARE SW NOTE  PATIENT NAME: Erica Young DOB: 1940-09-21 MRN: [id number]  PRIMARY CARE PROVIDER: Isaac Bliss, Rayford Halsted, MD  RESPONSIBLE PARTY:  Acct ID - Guarantor Home Phone Work Phone Relationship [place] Type  [id number] Durwin [phone]  Self P/F     [place], [place], [place] [address]   Due to the COVID-19 crisis, this virtual check-in visit was done via telephone from my office and it was initiated and consent given by this patient and or family.   PLAN OF CARE and INTERVENTIONS:             1. GOALS OF CARE/ ADVANCE CARE PLANNING:  Patient's goal is to continue receiving bereavement counseling.  She has a prior DNR. 2. SOCIAL/EMOTIONAL/SPIRITUAL ASSESSMENT/ INTERVENTIONS:  SW conducted a virtual check-in visit with patient in her home.  SW provided education regarding the [facility]N/Palliative Care program and she stated she understood.  She lives alone.  Her husband of 24 years died in 04/04/19.  She sounded tearful when discussing his death.  Patient is currently receiving bereavement counseling from [facility] [place], at [facility].  She said it is helping her both emotionally and physically.  She reported having memory issues.  A home visit is scheduled for 2/22, at 12:00. 3. PATIENT/CAREGIVER EDUCATION/ COPING:  Provided education regarding the Palliative Care program.  Patient copes by problem-solving. 4. PERSONAL EMERGENCY PLAN:  She will contact EMS. 5. COMMUNITY RESOURCES COORDINATION/ HEALTH CARE NAVIGATION:  None. 6. FINANCIAL/LEGAL CONCERNS/INTERVENTIONS:  None expressed by patient.     SOCIAL HX:  Social History   Tobacco Use  . Smoking status: Former Smoker    Packs/day: 0.50    Years: 20.00    Pack years: 10.00    Types: Cigarettes    Start date: 22    Quit date: 05/01/1991    Years since quitting: 28.1  . Smokeless tobacco: Never Used  Substance Use Topics  . Alcohol use: No    Alcohol/week: 0.0 standard  drinks    CODE STATUS:  Prior DNR ADVANCED DIRECTIVES: N MOST FORM COMPLETE:  N HOSPICE EDUCATION PROVIDED:  Y Duration of visit and documentation:  30 minutes.       Creola Corn Imogean Ciampa, LCSW

## 2019-06-22 ENCOUNTER — Other Ambulatory Visit: Payer: Medicare Other | Admitting: Licensed Clinical Social Worker

## 2019-06-22 ENCOUNTER — Other Ambulatory Visit: Payer: Self-pay

## 2019-06-22 ENCOUNTER — Other Ambulatory Visit: Payer: Medicare Other | Admitting: *Deleted

## 2019-06-22 ENCOUNTER — Telehealth: Payer: Self-pay | Admitting: Internal Medicine

## 2019-06-22 DIAGNOSIS — Z515 Encounter for palliative care: Secondary | ICD-10-CM

## 2019-06-22 NOTE — Telephone Encounter (Signed)
Pt bp was 85/50 during Monisha (from [employer] in [place]) visit. She is on 3 different heart medications. She is not sure what the PCP would like to do regarding the BP and heart medication.  Pt would like to know if it ok to discontinue Clartin cause she feels that it is not helping her sinuses? Pt is complaining that her skin is very dry and is not concerned about using lotion but wants to know why it is dry? If she is dehydrated how do they go about finding that out? If possible could lab work be done to find that out?  She also stated that she is not feeling well, tired, diarrhea, head hurts and doesn't feel right and wonder if that might be from dehydration.   Malachy Mood can be reached at [phone] Pt can be reached at [phone]   Both aware that Jerilee Hoh is working in hospital rn

## 2019-06-23 NOTE — Progress Notes (Signed)
COMMUNITY PALLIATIVE CARE SW NOTE  PATIENT NAME: Erica Young DOB: 03-18-1941 MRN: [id number]  PRIMARY CARE PROVIDER: Isaac Bliss, Rayford Halsted, MD  RESPONSIBLE PARTY:  Acct ID - Guarantor Home Phone Work Phone Relationship Acct Type  [id number] - [name],[name]  Self P/F     [place], [place], [place] [address]     PLAN OF CARE and INTERVENTIONS:             1. GOALS OF CARE/ ADVANCE CARE PLANNING:  Patient wishes to remain in her home.  She has a prior DNR. 2. SOCIAL/EMOTIONAL/SPIRITUAL ASSESSMENT/ INTERVENTIONS:  SW and Palliative Care RN, Daryl Eastern, met with patient in her home.  She was alert and oriented.  Patient complained of headaches and diarrhea.  Patient openly discussed her cancer and reports not wishing any further treatment.  Patient was born in [place] and has been the [place] for 57 years.  She has a son that lives locally and a son that lives in [place].  She reports being tired and cold most of the time.  She is continuing to adjust to the loss of her husband and has telehealth visis with Bradd Burner, Bereavement Counselor at [employer]. 3. PATIENT/CAREGIVER EDUCATION/ COPING:  Provided education regarding the Palliative Care program and gave her information folder.  She copes by problem-solving. 4. PERSONAL EMERGENCY PLAN:  She will contact her son for assistance. 5. COMMUNITY RESOURCES COORDINATION/ HEALTH CARE NAVIGATION:  None. 6. FINANCIAL/LEGAL CONCERNS/INTERVENTIONS:  None.     SOCIAL HX:  Social History   Tobacco Use  . Smoking status: Former Smoker    Packs/day: 0.50    Years: 20.00    Pack years: 10.00    Types: Cigarettes    Start date: 23    Quit date: 05/01/1991    Years since quitting: 28.1  . Smokeless tobacco: Never Used  Substance Use Topics  . Alcohol use: No    Alcohol/week: 0.0 standard drinks    CODE STATUS:  Prior DNR ADVANCED DIRECTIVES: N MOST FORM COMPLETE:  N HOSPICE EDUCATION PROVIDED:  N PPS:   Patient's appetite has decreased.  She ambulates independently. Duration of visit and documentation:  75 minutes.      Creola Corn Shavon Zenz, LCSW

## 2019-06-23 NOTE — Telephone Encounter (Signed)
Spoke with patient and an appointment scheduled. Verbal orders given to [facility]

## 2019-06-24 ENCOUNTER — Encounter: Payer: Self-pay | Admitting: Family Medicine

## 2019-06-24 ENCOUNTER — Ambulatory Visit (INDEPENDENT_AMBULATORY_CARE_PROVIDER_SITE_OTHER): Payer: Medicare Other | Admitting: Family Medicine

## 2019-06-24 ENCOUNTER — Other Ambulatory Visit: Payer: Self-pay

## 2019-06-24 VITALS — BP 120/80 | HR 79 | Temp 97.7°F | Wt 126.6 lb

## 2019-06-24 DIAGNOSIS — R197 Diarrhea, unspecified: Secondary | ICD-10-CM | POA: Diagnosis not present

## 2019-06-24 DIAGNOSIS — Z853 Personal history of malignant neoplasm of breast: Secondary | ICD-10-CM

## 2019-06-24 DIAGNOSIS — I10 Essential (primary) hypertension: Secondary | ICD-10-CM

## 2019-06-24 DIAGNOSIS — R42 Dizziness and giddiness: Secondary | ICD-10-CM

## 2019-06-24 DIAGNOSIS — I951 Orthostatic hypotension: Secondary | ICD-10-CM | POA: Diagnosis not present

## 2019-06-24 DIAGNOSIS — R531 Weakness: Secondary | ICD-10-CM

## 2019-06-24 LAB — COMPREHENSIVE METABOLIC PANEL
ALT: 13 U/L (ref 0–35)
AST: 17 U/L (ref 0–37)
Albumin: 3.8 g/dL (ref 3.5–5.2)
Alkaline Phosphatase: 114 U/L (ref 39–117)
BUN: 16 mg/dL (ref 6–23)
CO2: 24 mEq/L (ref 19–32)
Calcium: 9.3 mg/dL (ref 8.4–10.5)
Chloride: 101 mEq/L (ref 96–112)
Creatinine, Ser: 0.77 mg/dL (ref 0.40–1.20)
GFR: 72.29 mL/min (ref >60.00)
Glucose, Bld: 117 mg/dL — ABNORMAL HIGH (ref 70–99)
Potassium: 4.5 mEq/L (ref 3.5–5.1)
Sodium: 134 mEq/L — ABNORMAL LOW (ref 135–145)
Total Bilirubin: 0.5 mg/dL (ref 0.2–1.2)
Total Protein: 7 g/dL (ref 6.0–8.3)

## 2019-06-24 LAB — CBC WITH DIFFERENTIAL/PLATELET
Basophils Absolute: 0 10*3/uL (ref 0.0–0.1)
Basophils Relative: 0.8 % (ref 0.0–3.0)
Eosinophils Absolute: 0.2 10*3/uL (ref 0.0–0.7)
Eosinophils Relative: 3.5 % (ref 0.0–5.0)
HCT: 38.4 % (ref 36.0–46.0)
Hemoglobin: 13 g/dL (ref 12.0–15.0)
Lymphocytes Relative: 24.5 % (ref 12.0–46.0)
Lymphs Abs: 1.4 10*3/uL (ref 0.7–4.0)
MCHC: 33.7 g/dL (ref 30.0–36.0)
MCV: 95.9 fl (ref 78.0–100.0)
Monocytes Absolute: 0.4 10*3/uL (ref 0.1–1.0)
Monocytes Relative: 6.9 % (ref 3.0–12.0)
Neutro Abs: 3.6 10*3/uL (ref 1.4–7.7)
Neutrophils Relative %: 64.3 % (ref 43.0–77.0)
Platelets: 226 10*3/uL (ref 150.0–400.0)
RBC: 4 Mil/uL (ref 3.87–5.11)
RDW: 13.3 % (ref 11.5–15.5)
WBC: 5.6 10*3/uL (ref 4.0–10.5)

## 2019-06-24 NOTE — Progress Notes (Signed)
Subjective:     Patient ID: Erica Young, female   DOB: 1940-11-12, 80 y.o.   MRN: [id number]  HPI   Erica Young is seen a work in.  She apparently had home health nurse visit yesterday with low blood pressure.  Her history is that she has metastatic breast cancer.  She also had MRI of the brain back in October which showed a 5 cm nasal cavity mass which may possibly be a primary separate from her breast cancer.  She decided several months ago to forego any further treatment such as radiation.  She has been losing some weight.  We do not have specific blood pressure measurements but she states that her readings were "low "yesterday.  She is not had any recent syncope.  She had hemoglobin 12.6 back in October.  She is due for follow-up labs now.  She has had some intermittent diarrhea symptoms but none currently.  No recent nausea or vomiting.  She does have history of systolic failure and by most recent echo back in October had EF of 30 to 35%.  She is on low-dose carvedilol, spironolactone and low-dose losartan.  Past Medical History:  Diagnosis Date  . Allergy   . Anxiety   . Bone cancer ([place]) mri 11/11/14   right parietal bone,left cribriform plate metastases  . Cancer [facility]) 2007   right breat ca  , lumpectomy and radiation tx (declined chemo and additional prophylactic meds)  . Cataract    both eyes  10/2016 Lt.     04/2017 Rt.   . CEREBROVASCULAR DISEASE 03/03/2010  . Diverticulitis   . DIVERTICULOSIS, COLON 03/03/2010  . Fatty liver 08/02/09   as per U/S done by [facility]  . Foramen ovale    positive bubble study  . GERD (gastroesophageal reflux disease)   . Headache(784.0)    she thinks sinus headaches  . History of cerebral artery stenosis    right middle  . HYPERLIPIDEMIA 03/03/2010  . Hypertension   . HYPOTHYROIDISM 03/03/2010   no longer on meds  . Internal hemorrhoid   . Low back pain   . Mastoiditis    noted on brain MRI  . Rib fracture   . Right leg pain    . Stroke [facility]) Oct. 2011   TIA  . Ulcer    Past Surgical History:  Procedure Laterality Date  . BREAST LUMPECTOMY     right  . CATARACT EXTRACTION Left    10/2016   Rt 04/2017  . CHOLECYSTECTOMY    . COLONOSCOPY    . POLYPECTOMY     benign cecum  . SHOULDER SURGERY     right shoulder (dislocation)  . TONSILLECTOMY    . TUBAL LIGATION    . UPPER GASTROINTESTINAL ENDOSCOPY    . VIDEO BRONCHOSCOPY WITH ENDOBRONCHIAL ULTRASOUND N/A 01/07/2014   Procedure: VIDEO BRONCHOSCOPY WITH ENDOBRONCHIAL ULTRASOUND;  Surgeon: Ivin Poot, MD;  Location: [facility];  Service: Thoracic;  Laterality: N/A;    reports that she quit smoking about 28 years ago. Her smoking use included cigarettes. She started smoking about 48 years ago. She has a 10.00 pack-year smoking history. She has never used smokeless tobacco. She reports that she does not drink alcohol or use drugs. family history includes Heart disease in her brother, brother, sister, sister, sister, and sister; Stomach cancer in her father. Allergies  Allergen Reactions  . Ciprofloxacin Anaphylaxis   Wt Readings from Last 3 Encounters:  06/24/19 126 lb 9.6 oz (57.4  kg)  05/20/19 127 lb 12.8 oz (58 kg)  05/12/19 126 lb 6.4 oz (57.3 kg)     Review of Systems  Constitutional: Negative for chills and fever.  Respiratory: Negative for shortness of breath.   Cardiovascular: Negative for chest pain.  Gastrointestinal: Negative for abdominal pain, nausea and vomiting.  Neurological: Positive for dizziness and weakness.  Psychiatric/Behavioral: Negative for confusion.       Objective:   Physical Exam Vitals reviewed.  Constitutional:      Appearance: Normal appearance.  Cardiovascular:     Rate and Rhythm: Normal rate and regular rhythm.  Pulmonary:     Effort: Pulmonary effort is normal.     Breath sounds: Normal breath sounds.  Musculoskeletal:     Right lower leg: No edema.     Left lower leg: No edema.  Neurological:      General: No focal deficit present.     Mental Status: She is alert.        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  She has decided against any further interventions at this point.  She has some orthostatic changes with supine blood pressure 140/80 with seated 100/72 and standing 104/70.  We suggested the following    Plan:     -Reduce carvedilol from 12.5 mg twice daily to 6.25 mg twice daily.  May need to restrict her medications further but will try to maintain low doses for her heart failure history  -Recommend daily weights  -She has labs pending today with CBC and comprehensive metabolic panel  -We discussed changing positions slowly and consider walker use which she has at home currently  Erica Post MD [facility]

## 2019-06-24 NOTE — Patient Instructions (Addendum)
Drink more fluids  Change positions slowly  REDUCE THE CARVEDILOL TO ONE HALF TABLET TWICE DAILY   Food Choices to Help Relieve Diarrhea, Adult When you have diarrhea, the foods you eat and your eating habits are very important. Choosing the right foods and drinks can help:  Relieve diarrhea.  Replace lost fluids and nutrients.  Prevent dehydration. What general guidelines should I follow?  Relieving diarrhea  Choose foods with less than 2 g or .07 oz. of fiber per serving.  Limit fats to less than 8 tsp (38 g or 1.34 oz.) a day.  Avoid the following: ? Foods and beverages sweetened with high-fructose corn syrup, honey, or sugar alcohols such as xylitol, sorbitol, and mannitol. ? Foods that contain a lot of fat or sugar. ? Fried, greasy, or spicy foods. ? High-fiber grains, breads, and cereals. ? Raw fruits and vegetables.  Eat foods that are rich in probiotics. These foods include dairy products such as yogurt and fermented milk products. They help increase healthy bacteria in the stomach and intestines (gastrointestinal tract, or GI tract).  If you have lactose intolerance, avoid dairy products. These may make your diarrhea worse.  Take medicine to help stop diarrhea (antidiarrheal medicine) only as told by your health care provider. Replacing nutrients  Eat small meals or snacks every 3-4 hours.  Eat bland foods, such as white rice, toast, or baked potato, until your diarrhea starts to get better. Gradually reintroduce nutrient-rich foods as tolerated or as told by your health care provider. This includes: ? Well-cooked protein foods. ? Peeled, seeded, and soft-cooked fruits and vegetables. ? Low-fat dairy products.  Take vitamin and mineral supplements as told by your health care provider. Preventing dehydration  Start by sipping water or a special solution to prevent dehydration (oral rehydration solution, ORS). Urine that is clear or pale yellow means that you are  getting enough fluid.  Try to drink at least 8-10 cups of fluid each day to help replace lost fluids.  You may add other liquids in addition to water, such as clear juice or decaffeinated sports drinks, as tolerated or as told by your health care provider.  Avoid drinks with caffeine, such as coffee, tea, or soft drinks.  Avoid alcohol. What foods are recommended?     The items listed may not be a complete list. Talk with your health care provider about what dietary choices are best for you. Grains White rice. White, [place], or pita breads (fresh or toasted), including plain rolls, buns, or bagels. White pasta. Saltine, soda, or graham crackers. Pretzels. Low-fiber cereal. Cooked cereals made with water (such as cornmeal, farina, or cream cereals). Plain muffins. Matzo. Melba toast. Zwieback. Vegetables Potatoes (without the skin). Most well-cooked and canned vegetables without skins or seeds. Tender lettuce. Fruits Apple sauce. Fruits canned in juice. Cooked apricots, cherries, grapefruit, peaches, pears, or plums. Fresh bananas and cantaloupe. Meats and other protein foods Baked or boiled chicken. Eggs. Tofu. Fish. Seafood. Smooth nut butters. Ground or well-cooked tender beef, ham, veal, lamb, pork, or poultry. Dairy Plain yogurt, kefir, and unsweetened liquid yogurt. Lactose-free milk, buttermilk, skim milk, or soy milk. Low-fat or nonfat hard cheese. Beverages Water. Low-calorie sports drinks. Fruit juices without pulp. Strained tomato and vegetable juices. Decaffeinated teas. Sugar-free beverages not sweetened with sugar alcohols. Oral rehydration solutions, if approved by your health care provider. Seasoning and other foods Bouillon, broth, or soups made from recommended foods. What foods are not recommended? The items listed may not be  a complete list. Talk with your health care provider about what dietary choices are best for you. Grains Whole grain, whole wheat, bran, or rye  breads, rolls, pastas, and crackers. Wild or brown rice. Whole grain or bran cereals. Barley. Oats and oatmeal. Corn tortillas or taco shells. Granola. Popcorn. Vegetables Raw vegetables. Fried vegetables. Cabbage, broccoli, Brussels sprouts, artichokes, baked beans, beet greens, corn, kale, legumes, peas, sweet potatoes, and yams. Potato skins. Cooked spinach and cabbage. Fruits Dried fruit, including raisins and dates. Raw fruits. Stewed or dried prunes. Canned fruits with syrup. Meat and other protein foods Fried or fatty meats. Deli meats. Chunky nut butters. Nuts and seeds. Beans and lentils. Berniece Salines. Hot dogs. Sausage. Dairy High-fat cheeses. Whole milk, chocolate milk, and beverages made with milk, such as milk shakes. Half-and-half. Cream. sour cream. Ice cream. Beverages Caffeinated beverages (such as coffee, tea, soda, or energy drinks). Alcoholic beverages. Fruit juices with pulp. Prune juice. Soft drinks sweetened with high-fructose corn syrup or sugar alcohols. High-calorie sports drinks. Fats and oils Butter. Cream sauces. Margarine. Salad oils. Plain salad dressings. Olives. Avocados. Mayonnaise. Sweets and desserts Sweet rolls, doughnuts, and sweet breads. Sugar-free desserts sweetened with sugar alcohols such as xylitol and sorbitol. Seasoning and other foods Honey. Hot sauce. Chili powder. Gravy. Cream-based or milk-based soups. Pancakes and waffles. Summary  When you have diarrhea, the foods you eat and your eating habits are very important.  Make sure you get at least 8-10 cups of fluid each day, or enough to keep your urine clear or pale yellow.  Eat bland foods and gradually reintroduce healthy, nutrient-rich foods as tolerated, or as told by your health care provider.  Avoid high-fiber, fried, greasy, or spicy foods. This information is not intended to replace advice given to you by your health care provider. Make sure you discuss any questions you have with your health  care provider. Document Revised: 08/07/2018 Document Reviewed: 04/13/2016 Elsevier Patient Education  [place].

## 2019-06-26 NOTE — Addendum Note (Signed)
Addended by: Gwenyth Ober R on: 06/26/2019 08:29 AM   Modules accepted: Orders

## 2019-06-28 ENCOUNTER — Other Ambulatory Visit: Payer: Self-pay

## 2019-06-28 ENCOUNTER — Encounter (HOSPITAL_COMMUNITY): Payer: Self-pay | Admitting: Emergency Medicine

## 2019-06-28 ENCOUNTER — Emergency Department (HOSPITAL_COMMUNITY)
Admission: EM | Admit: 2019-06-28 | Discharge: 2019-06-28 | Disposition: A | Discharge status: Home / Self Care | Payer: Medicare Other | Attending: Emergency Medicine | Admitting: Emergency Medicine

## 2019-06-28 DIAGNOSIS — J3489 Other specified disorders of nose and nasal sinuses: Secondary | ICD-10-CM | POA: Insufficient documentation

## 2019-06-28 DIAGNOSIS — I110 Hypertensive heart disease with heart failure: Secondary | ICD-10-CM | POA: Diagnosis not present

## 2019-06-28 DIAGNOSIS — Z853 Personal history of malignant neoplasm of breast: Secondary | ICD-10-CM | POA: Diagnosis not present

## 2019-06-28 DIAGNOSIS — E039 Hypothyroidism, unspecified: Secondary | ICD-10-CM | POA: Diagnosis not present

## 2019-06-28 DIAGNOSIS — Z923 Personal history of irradiation: Secondary | ICD-10-CM | POA: Insufficient documentation

## 2019-06-28 DIAGNOSIS — Z87891 Personal history of nicotine dependence: Secondary | ICD-10-CM | POA: Insufficient documentation

## 2019-06-28 DIAGNOSIS — K0889 Other specified disorders of teeth and supporting structures: Secondary | ICD-10-CM | POA: Diagnosis not present

## 2019-06-28 DIAGNOSIS — Z85830 Personal history of malignant neoplasm of bone: Secondary | ICD-10-CM | POA: Insufficient documentation

## 2019-06-28 DIAGNOSIS — Z8673 Personal history of transient ischemic attack (TIA), and cerebral infarction without residual deficits: Secondary | ICD-10-CM | POA: Diagnosis not present

## 2019-06-28 DIAGNOSIS — Z9221 Personal history of antineoplastic chemotherapy: Secondary | ICD-10-CM | POA: Insufficient documentation

## 2019-06-28 DIAGNOSIS — K089 Disorder of teeth and supporting structures, unspecified: Secondary | ICD-10-CM

## 2019-06-28 DIAGNOSIS — G8929 Other chronic pain: Secondary | ICD-10-CM

## 2019-06-28 DIAGNOSIS — Z79899 Other long term (current) drug therapy: Secondary | ICD-10-CM | POA: Diagnosis not present

## 2019-06-28 DIAGNOSIS — I5020 Unspecified systolic (congestive) heart failure: Secondary | ICD-10-CM | POA: Diagnosis not present

## 2019-06-28 DIAGNOSIS — H532 Diplopia: Secondary | ICD-10-CM | POA: Diagnosis present

## 2019-06-28 DIAGNOSIS — Z7982 Long term (current) use of aspirin: Secondary | ICD-10-CM | POA: Insufficient documentation

## 2019-06-28 LAB — CBC WITH DIFFERENTIAL/PLATELET
Abs Immature Granulocytes: 0.01 10*3/uL (ref 0.00–0.07)
Basophils Absolute: 0 10*3/uL (ref 0.0–0.1)
Basophils Relative: 0 %
Eosinophils Absolute: 0.2 10*3/uL (ref 0.0–0.5)
Eosinophils Relative: 3 %
HCT: 42.2 % (ref 36.0–46.0)
Hemoglobin: 14 g/dL (ref 12.0–15.0)
Immature Granulocytes: 0 %
Lymphocytes Relative: 23 %
Lymphs Abs: 1.3 10*3/uL (ref 0.7–4.0)
MCH: 32.1 pg (ref 26.0–34.0)
MCHC: 33.2 g/dL (ref 30.0–36.0)
MCV: 96.8 fL (ref 80.0–100.0)
Monocytes Absolute: 0.5 10*3/uL (ref 0.1–1.0)
Monocytes Relative: 8 %
Neutro Abs: 3.8 10*3/uL (ref 1.7–7.7)
Neutrophils Relative %: 66 %
Platelets: 220 10*3/uL (ref 150–400)
RBC: 4.36 MIL/uL (ref 3.87–5.11)
RDW: 12.3 % (ref 11.5–15.5)
WBC: 5.8 10*3/uL (ref 4.0–10.5)
nRBC: 0 % (ref 0.0–0.2)

## 2019-06-28 LAB — BASIC METABOLIC PANEL
Anion gap: 11 (ref 5–15)
BUN: 15 mg/dL (ref 8–23)
CO2: 24 mmol/L (ref 22–32)
Calcium: 8.9 mg/dL (ref 8.9–10.3)
Chloride: 102 mmol/L (ref 98–111)
Creatinine, Ser: 0.65 mg/dL (ref 0.44–1.00)
GFR calc Af Amer: >60 mL/min (ref >60)
GFR calc non Af Amer: >60 mL/min (ref >60)
Glucose, Bld: 95 mg/dL (ref 70–99)
Potassium: 4 mmol/L (ref 3.5–5.1)
Sodium: 137 mmol/L (ref 135–145)

## 2019-06-28 NOTE — ED Provider Notes (Signed)
Erica Young   CSN: [id number] Arrival date & time: 06/28/19  1251     History Chief Complaint  Patient presents with  . Diplopia    Erica Young is a 79 y.o. 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  States that she woke up this morning and started experiencing diplopia in her left eye.  This has been intermittent for the past several months but she has not seen her oncologist or neurologist about the symptoms.  She was told that it could be due to the mass found "between my eyes."  She also complains of ongoing left upper dental pain for the past 6 to 7 months that has not improved with several rounds of antibiotics.  At the time of my evaluation, patient not experiencing any blurry vision or diplopia.  Denies a headache, neck pain or stiffness, fever, injuries or falls, numbness in arms or legs, changes to gait, shortness of breath.  HPI     Past Medical History:  Diagnosis Date  . Allergy   . Anxiety   . Bone cancer ([place]) mri 11/11/14   right parietal bone,left cribriform plate metastases  . Cancer [facility]) 2007   right breat ca  , lumpectomy and radiation tx (declined chemo and additional prophylactic meds)  . Cataract    both eyes  10/2016 Lt.     04/2017 Rt.   . CEREBROVASCULAR DISEASE 03/03/2010  . Diverticulitis   . DIVERTICULOSIS, COLON 03/03/2010  . Fatty liver 08/02/09   as per U/S done by [facility]  . Foramen ovale    positive bubble study  . GERD (gastroesophageal reflux disease)   . Headache(784.0)    she thinks sinus headaches  . History of cerebral artery stenosis    right middle  . HYPERLIPIDEMIA 03/03/2010  . Hypertension   . HYPOTHYROIDISM 03/03/2010   no longer on meds  . Internal hemorrhoid     . Low back pain   . Mastoiditis    noted on brain MRI  . Rib fracture   . Right leg pain   . Stroke [facility]) Oct. 2011   TIA  . Ulcer     Patient Active Problem List   Diagnosis Date Noted  . Goals of care, counseling/discussion 05/12/2019  . Syncope   . Acute systolic (congestive) heart failure ([place])   . Shortness of breath   . Acute left-sided weakness 01/29/2019  . Leukopenia due to antineoplastic chemotherapy ([place]) 01/29/2019  . Anxiety 01/29/2019  . Aortic atherosclerosis ([place]) 01/03/2018  . History of epistaxis 10/15/2017  . GI symptoms/possible food intolerance 06/18/2017  . Malignant neoplasm of upper-outer quadrant of right breast in female, estrogen receptor positive ([place]) 01/19/2016  . Bone metastases ([place]) 11/22/2014  . Perennial allergic rhinitis with a nonallergic component 02/12/2014  . Cancer of right breast, stage 4 ([place]) 01/23/2014  . History of breast cancer 01/11/2014  . Chronic rhinitis 09/17/2013  . Hilar adenopathy 05/12/2013  . Abdominal pain 05/07/2011  . Hypertension 06/09/2010  . BACK PAIN 04/10/2010  . Dyslipidemia 03/03/2010  . Cerebrovascular disease 03/03/2010  . DIVERTICULOSIS, COLON 03/03/2010    Past Surgical History:  Procedure Laterality Date  . BREAST LUMPECTOMY     right  . CATARACT EXTRACTION Left  10/2016   Rt 04/2017  . CHOLECYSTECTOMY    . COLONOSCOPY    . POLYPECTOMY     benign cecum  . SHOULDER SURGERY     right shoulder (dislocation)  . TONSILLECTOMY    . TUBAL LIGATION    . UPPER GASTROINTESTINAL ENDOSCOPY    . VIDEO BRONCHOSCOPY WITH ENDOBRONCHIAL ULTRASOUND N/A 01/07/2014   Procedure: VIDEO BRONCHOSCOPY WITH ENDOBRONCHIAL ULTRASOUND;  Surgeon: Ivin Poot, MD;  Location: [facility] OR;  Service: Thoracic;  Laterality: N/A;     OB History    Gravida  4   Para  2   Term      Preterm      AB      Living        SAB      TAB      Ectopic      Multiple      Live Births              Family History   Problem Relation Age of Onset  . Heart disease Sister        cerebral vascular disease also  . Heart disease Brother        cerebral vascular disease also  . Heart disease Brother        cerebral vascular disease  . Heart disease Sister        cebreal vascular disease also  . Heart disease Sister        cebreal vascular diease also  . Heart disease Sister        cerebral vascular diease also  . Stomach cancer Father   . Colon cancer Neg Hx   . Esophageal cancer Neg Hx   . Rectal cancer Neg Hx   . Colon polyps Neg Hx   . Asthma Neg Hx     Social History   Tobacco Use  . Smoking status: Former Smoker    Packs/day: 0.50    Years: 20.00    Pack years: 10.00    Types: Cigarettes    Start date: 76    Quit date: 05/01/1991    Years since quitting: 28.1  . Smokeless tobacco: Never Used  Substance Use Topics  . Alcohol use: No    Alcohol/week: 0.0 standard drinks  . Drug use: No    Home Medications Prior to Admission medications   Medication Sig Start Date End Date Taking? Authorizing Provider  ALPRAZolam (XANAX) 0.25 MG tablet Take 1 tablet (0.25 mg total) by mouth 3 (three) times daily as needed for anxiety. 05/07/19  Yes Isaac Bliss, Rayford Halsted, MD  aspirin EC 81 MG tablet Take 81 mg by mouth daily after breakfast.    Yes [provider]  carvedilol (COREG) 12.5 MG tablet Take 1 tablet (12.5 mg total) by mouth 2 (two) times daily with a meal. Patient taking differently: Take 6.25 mg by mouth 2 (two) times daily with a meal. Take ONE HALF tablet twice daily. 06/05/19  Yes Isaac Bliss, Rayford Halsted, MD  Cholecalciferol (VITAMIN D PO) Take 1,000 Units by mouth 2 (two) times daily.   Yes [provider]  docusate sodium (COLACE) 100 MG capsule Take 100 mg by mouth daily.   Yes [provider]  Glucosamine 500 MG CAPS Take 500 mg by mouth daily.    Yes [provider]  loratadine (CLARITIN) 10 MG tablet Take 1 tablet (10 mg total) by mouth  daily. 04/16/19  Yes Isaac Bliss, Rayford Halsted, MD  losartan (  COZAAR) 25 MG tablet Take 0.5 tablets (12.5 mg total) by mouth daily. 02/05/19  Yes Danford, Suann Larry, MD  ondansetron (ZOFRAN ODT) 4 MG disintegrating tablet Take 1 tablet (4 mg total) by mouth every 8 (eight) hours as needed for nausea or vomiting. 03/13/19  Yes Isaac Bliss, Rayford Halsted, MD  spironolactone (ALDACTONE) 25 MG tablet Take 0.5 tablets (12.5 mg total) by mouth daily. 02/05/19  Yes Danford, Suann Larry, MD  saccharomyces boulardii (FLORASTOR) 250 MG capsule Take 1 capsule (250 mg total) by mouth 2 (two) times daily. Patient not taking: Reported on 06/28/2019 05/29/19   Isaac Bliss, Rayford Halsted, MD    Allergies    Ciprofloxacin  Review of Systems   Review of Systems  Constitutional: Negative for appetite change, chills and fever.  HENT: Negative for ear pain, rhinorrhea, sneezing and sore throat.   Eyes: Positive for visual disturbance. Negative for photophobia.  Respiratory: Negative for cough, chest tightness, shortness of breath and wheezing.   Cardiovascular: Negative for chest pain and palpitations.  Gastrointestinal: Negative for abdominal pain, blood in stool, constipation, diarrhea, nausea and vomiting.  Genitourinary: Negative for dysuria, hematuria and urgency.  Musculoskeletal: Negative for myalgias.  Skin: Negative for rash.  Neurological: Negative for dizziness, weakness and light-headedness.    Physical Exam Updated Vital Signs BP 131/76 (BP Location: Right Arm)   Pulse 82   Temp 97.6 F (36.4 C) (Oral)   Resp 14   SpO2 95%   Physical Exam Vitals and nursing Young reviewed.  Constitutional:      General: She is not in acute distress.    Appearance: She is well-developed.  HENT:     Head: Normocephalic and atraumatic.     Nose: Nose normal.  Eyes:     General: No scleral icterus.       Right eye: No discharge.        Left eye: No discharge.     Extraocular Movements: Extraocular  movements intact.     Conjunctiva/sclera: Conjunctivae normal.     Pupils: Pupils are equal, round, and reactive to light.     Comments: No abnormal EOMs.  Cardiovascular:     Rate and Rhythm: Normal rate and regular rhythm.     Heart sounds: Normal heart sounds. No murmur. No friction rub. No gallop.   Pulmonary:     Effort: Pulmonary effort is normal. No respiratory distress.     Breath sounds: Normal breath sounds.  Abdominal:     General: Bowel sounds are normal. There is no distension.     Palpations: Abdomen is soft.     Tenderness: There is no abdominal tenderness. There is no guarding.  Musculoskeletal:        General: Normal range of motion.     Cervical back: Normal range of motion and neck supple.  Skin:    General: Skin is warm and dry.     Findings: No rash.  Neurological:     General: No focal deficit present.     Mental Status: She is alert and oriented to person, place, and time.     Cranial Nerves: No cranial nerve deficit.     Sensory: No sensory deficit.     Motor: No weakness or abnormal muscle tone.     Coordination: Coordination normal.     Comments: Pupils reactive. No facial asymmetry noted. Cranial nerves appear grossly intact. Sensation intact to light touch on face, BUE and BLE. Strength 5/5 in BUE and BLE. Normal patellar  reflexes bilaterally.     ED Results / Procedures / Treatments   Labs (all labs ordered are listed, but only abnormal results are displayed) Labs Reviewed  BASIC METABOLIC PANEL  CBC WITH DIFFERENTIAL/PLATELET    EKG None  Radiology No results found.  Procedures Procedures (including critical care time)  Medications Ordered in ED Medications - No data to display  ED Course  I have reviewed the triage vital signs and the nursing notes.  Pertinent labs & imaging results that were available during my care of the patient were reviewed by me and considered in my medical decision making (see chart for details).  Clinical  Course as of Jun 27 1508  Sun Jun 28, 2019  1338 Patient declining MRI.   [HK]    Clinical Course User Index [HK] Delia Heady, [place]   MDM Rules/Calculators/A&P                      79 year old female with past medical history of breast cancer, metastasis to the brain and a nasal cavity mass noted in October 2020 presenting to the ED for diplopia and left-sided dental pain.  Essentially the dental pain has been there for the past 6 to 7 months and she has been on 4 courses of antibiotics for this.  She has not seen a dentist about this.  She was told in October that she has this mass in her left nasal cavity but she is not willing to pursue any further treatment for this due to risks.  She has had this left-sided diplopia for the past 1 to 2 months.  She has seen an ENT specialist for the mass as well.  States that he continues to have dental pain and diplopia.  On my exam there are no abnormal EOMs noted.  No weakness or sensory deficit noted on exam.  No facial asymmetry noted.  She denies any headache.  Lab work here is unremarkable.  I did offer imaging including CT or MRI to evaluate for worsening of this mass.  Patient declines imaging as she is unsure if this will help in any way. States again that she does not want to pursue treatment or management of this mass.  I feel this is reasonable but did offer her pain medication which she declines.  I suspect that both the diplopia and the dental pain could be secondary to this mass in her head.  I will refer her to a dentist which he is appreciative of.  We will have her continue her home medications and over-the-counter pain medications.  At this time I doubt retinal detachment, iritis, keratitis, CVA or other emergent cause of her symptoms.  Patient is hemodynamically stable, in NAD, and able to ambulate in the ED. Evaluation does not show pathology that would require ongoing emergent intervention or inpatient treatment. I have personally reviewed  and interpreted all lab work and imaging at today's ED visit. I explained the diagnosis to the patient. Pain has been managed and has no complaints prior to discharge. Patient is comfortable with above plan and is stable for discharge at this time. All questions were answered prior to disposition. Strict return precautions for returning to the ED were discussed. Encouraged follow up with PCP.   An After Visit Summary was printed and given to the patient.   Portions of this Young were generated with [employer]. Dictation errors may occur despite best attempts at proofreading.  Final Clinical Impression(s) / ED  Diagnoses Final diagnoses:  Nasal cavity mass  Chronic dental pain    Rx / DC Orders ED Discharge Orders    None       Delia Heady, PA-C 06/28/19 1510    Dorie Rank, MD 06/29/19 1018

## 2019-06-28 NOTE — Discharge Instructions (Addendum)
Follow-up with the dentist listed below for further evaluation of your dental pain. You can continue taking your medications as prescribed. Return to the ED if you start to develop worsening vision changes, numbness in your arms or legs, injuries or falls, chest pain.

## 2019-06-28 NOTE — ED Triage Notes (Addendum)
Pt reports known history of cancer/brain tumor, states she woke up this morning at 7 am with double vision in her L eye. She states that vision improved some as the morning went on but has gotten worse again. She also endorses pain and swelling to her L jaw/upper teeth that she states she has been on multiple courses of abx for. Pt a/ox4, speech clear, grip strength equal, no arm drift, face symmetrical.

## 2019-06-29 NOTE — Progress Notes (Signed)
COMMUNITY PALLIATIVE CARE RN NOTE  PATIENT NAME: Erica Young DOB: 1940-06-15 MRN: [id number]  PRIMARY CARE PROVIDER: Isaac Bliss, Rayford Halsted, MD  RESPONSIBLE PARTY:  Acct ID - Guarantor Home Phone Work Phone Relationship Acct Type  [id number] Durwin [phone]  Self P/F     [place], [place], [place] [address]   Covid-19 Pre-screening Negative  PLAN OF CARE and INTERVENTION:  1. ADVANCE CARE PLANNING/GOALS OF CARE: Goal is for patient to feel better overall. She says she is hoping for a miracle. She has a DNR. 2. PATIENT/CAREGIVER EDUCATION: Explained Palliative care services, symptom management 3. DISEASE STATUS: Joint visit made with Palliative care SW, Lynn Duffy. Met with patient in her home. Patient is alert and oriented x 3 and able to engage in appropriate conversation. She has some mild word finding difficulties. She reports pain in her left shoulder and arm. She feels this is d/t her carrying a grocery bag recently on her left arm. She does not take any type of pain medication, stating that she does not like to. She also c/o a headache and some dizziness. She does experience some dyspnea on exertion, but recovers fairly quickly while at rest. She reports always feeling cold. She is having difficulties with her vision at times d/t a mass she says is located between her eyes. She says this makes breathing in through her nose on the left side difficult. She continues to drive to do her grocery shopping. She is independent with bathing, dressing, feeding and hygiene. She says her appetite has declined. She is continent of both bowel and bladder. She has been having issues with alternating between diarrhea and constipation. It has been ok for the past 2 days. She also c/o dryness to her forehead around her temples and feels that her dry skin is indicative of her being dehydrated. Her BP is currently 85/50. She is also on BP medications. She says that she is tired most of the time,  and spends much of day lying around. I contacted her PCPs office and made them aware of her issues with diarrhea vs constipation, low BP and patient also questions whether she should continue Claritin for her sinuses, as she does not feel that this is helping. PCPs office advised they will contact patient to arrange an office visit to assess her and to address her concerns. She speaks of her husband dying recently, which has caused some feelings of sadness. She is currently being counseled weekly by [facility] with Manufacturing engineer, and feels that this is helpful. She is agreeable to future Palliative care visits. Will continue to monitor.   HISTORY OF PRESENT ILLNESS: This is a 79 yo female who resides at home alone. She has a supportive family. Palliative care was asked to follow patient for additional support. Will visit patient monthly and PRN.    CODE STATUS: DNR ADVANCED DIRECTIVES: N MOST FORM: no PPS: 60%   PHYSICAL EXAM:   VITALS: Today's Vitals   06/22/19 1319  BP: (!) 85/50  Pulse: 76  Resp: 20  Temp: 97.9 F (36.6 C)  TempSrc: Other (Comment)  SpO2: 96%  PainSc: 4   PainLoc: Arm    LUNGS: clear to auscultation  CARDIAC: Cor RRR EXTREMITIES: No edema SKIN: Exposed skin is dry and intact  NEURO: Alert and oriented x 3, double vision at times, generalized weakness, ambulatory   (Duration of visit and documentation 90 minutes)   Daryl Eastern, RN BSN

## 2019-07-02 ENCOUNTER — Encounter: Payer: Self-pay | Admitting: *Deleted

## 2019-07-02 ENCOUNTER — Other Ambulatory Visit: Payer: Self-pay | Admitting: *Deleted

## 2019-07-02 NOTE — Patient Outreach (Signed)
[facility]) Care Management  07/02/2019  Erica Young Jun 30, 1940 [id number]   [facility] outreach to complex care patient (recent ED visit)  Insurance:[employer] and [employer] Medicare supplement Erica Young was referred to [facility] on 02/27/19 by [facility] acute coordinator [facility]) Lonn [place] after her discharge from [facility] skilled nursing facility (snf) with [facility] home health services.  She also is being followed by [facility] LCSW, Edwyna Shell  Initial outreach attempts were not successful from 02/27/19 to 03/10/19 after several messages and an unsuccessful outreach letter was sent  Erica Young returned a call to [place] on 03/10/19 and was given time to ventilate her feelings as her husband had passed on 03/22/19 at [place] [facility] after her discharge Erica Young had been outreach by [facility] SW after her discharge home as [facility] RN CM had referred her to [place]. During the [facility] SW contact Erica Young had been offered various services and resources but she declined stating her son could assist her with transportation, meals, etc  After successful [facility] RN CM outreach, [facility] RN CM referred her back to [place] services for grief sharing and counseling as Erica Young agreed to a referral  Erica Young at intervals outreached to [place] and was provided support and allowed time to ventilate her feelings [facility] SW outreached to Erica Young on 03/15/20. Erica Young agreed to [facility] SW short term telephonic supportive services from [facility] SW until [facility] SW was able to assist with establishment of long term community therapist.  Erica Young later declined therapy community services. There were several unsuccessful [facility] SW outreaches. THN SW collaborated with [facility] SW, [facility] to attempt to maintain contact and services for Erica Young. Erica Young had successful outreach with [facility] health cancer center SW [facility] in December 2020 with unsuccessful outreaches by [facility] RN CM and SW (leading to  [facility] SW case closure for not being able to maintain contact with Erica Young- Calls and letter sent without response) Erica Young has received outreach from [place] as she agreed to monthly on 04/30/19 and 05/2719 but each of these outreaches have concluded with Erica Young informing [facility] RN CM she needs to conclude the outreach call [facility] RN CM noted audible and voiced sinus symptoms during 03/23/19 and 03/24/19 outreaches to Erica Young and collaborated with her primary care provider (PCP) to discuss her symptoms. A telephone visit was scheduled on 03/30/19 and she received Augmentin treatment for sinusitis provided by Dr Sarajane Jews. 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 Erica Young was seen on 05/20/19 but Dr Melina Modena, ENT but was undecided about proceeding with treatment for the nasal cavity mass and has been encouraged to contact Dr Sondra Come if she decides to proceed with treatment    Outreach successful today n 07/02/19  Erica Young was able to verify HIPAA ([place] and Accountability Act) identifiers, date of birth (DOB) and address [facility]N RN CM spoke slower for better understanding as she requests  Follow up ED visit for nasal cavity mass and chronic dental pain  Audible nasal congestion is heard/noted  Erica Young informs [facility] RN CM she is not doing well today [facility] RN CM offered assistance to help her with medications, follow up appointments, disease management, etc She states she is in pain but preferred not  to provide answers for description, level of pain, and states she does not like to take medications. [facility] RN CM discusses heat, cold applications or possible alternative methods to reduce pain. Erica Young discussed that she has decided she does not prefer to have the offered oncology treatments for right stage 4 breast cancer as she does not feel she would be able  to tolerate them. It is noted on 03/25/19 Dr Jana Hakim office visit that she voiced not wanting to continue oncology treatment She confirms she is to follow up with Dr Jana Hakim in May 2021 and her pcp in April 2021 She is maintaining contact telephonically with her pcp office related to worsening symptoms like diarrhea, renewal of medications, etc [facility] RN CM attempted to inquire of her knowledge or interest of palliative care services to provide her with support and to honor her decision not to have further oncology treatment but Erica Hew informed [facility] RN CM she had to conclude the call.   She reports "there is nothing I think you can do to help me right now"   [facility] RN CM provided [facility] RN CM's contact number and discussed with Erica Young that she was welcome to outreach to the [facility] RN CM as needed She agrees to return a call to [place] CM as needed  After conclusion of the call [facility] RN CM noted in Epic a successful Palliative care SW outreach to Erica Young on 06/18/19 with connectin to [place] is a 80 year old retired female who lives at home alone with family checking in on her. Her husband Erica Young passes on 03-15-2019 at [place] place after she was d/c home from [facility] on 02/27/19. She has supportive family and friends. Her son Erica Young is supportive. She is able to complete ADLs and needs assist with iADLs and transportation from her son and family. She does understand and speak english but requests english to be spoken slower so she can understand clearly.    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  Appointments 07/06/19 Gastroenterology- [facility] [place] 07/2019 plans to see pcp 08/31/19 Dr Darnell Level [facility]  Plan [facility] RN CM will pend Erica Scullin for a follow up outreach within the next 4- 40 days and pending a possible return call from her as no care coordination need was reported and offer to provide disease education and management declined   [facility] CM Care Plan Problem One     Most Recent Value  Care Plan Problem One  Breast cancer wtih bone mets home care needs  Role Documenting the Problem One  Care Management Telephonic Coordinator  Care Plan for Problem One  Active  [facility] Long Term Goal   over the next 60 days patient will receive knowledge of services and resources for home care of breast cancer with bone mets  [facility] Long Term Goal Start Date  05/27/19  Interventions for Problem One Long Term Goal  Assess for care ccodination, disease and symptom management needs, offer assistance and contact for [facility] RN CM   THN CM Short Term Goal #1   over the next 30 days patient will be  offered [facility] SW if she chooses for grief related to the loss of her husband  [facility] CM Short Term Goal #1 Start Date  03/11/19  [facility] CM Short Term Goal #1 Met Date  03/16/19       [name], RN, BSN, [place] Coordinator Office number [phone] Mobile number [phone]  Main [facility]N number [phone] Fax number [phone]

## 2019-07-04 NOTE — Progress Notes (Signed)
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 Past cholecystectomy. She presents today with complaints of having central generalized abdominal pain for the past 1 to 2 months which occurs at night. She questions if this is gas pain. No noticeable abdominal pain during the day. She is often awakened from sleep by this pain. Occasional nausea, no vomiting. She has lost 6lbs over the past 5 months. No fever, sweats of chills. She had diarrhea a few weeks ago which resolved. She is passing 3 to 4 BMs daily. The first BM is solid and the next 2 to 3 stools are soft. No rectal bleeding or black stools.  Her most recent colonoscopy was 08/29/2017 which was incomplete due to solid stool present in the sigmoid colon. She does not wish to pursue any further colonoscopies.   She was seen in office by Dr. Elease Hashimoto 2/24 with dizziness, her Carvedilol dose was reduced and her dizziness has improved. She was evaluated in the ED 06/27/2018 with diplopia and left sided dental pain. She declined further imaging/MRI.  Neuro status was stable. Labs were normal. She was discharged home.   CBC Latest Ref Rng & Units 06/28/2019 06/24/2019 02/04/2019  WBC 4.0 - 10.5 K/uL 5.8 5.6 3.0(L)  Hemoglobin 12.0 - 15.0 g/dL 14.0 13.0 12.6  Hematocrit 36.0 - 46.0 % 42.2 38.4 35.3(L)  Platelets 150 - 400 K/uL 220 226.0 237   CMP Latest Ref Rng & Units 06/28/2019 06/24/2019 02/04/2019    Glucose 70 - 99 mg/dL 95 117(H) 112(H)  BUN 8 - 23 mg/dL 15 16 17   Creatinine 0.44 - 1.00 mg/dL 0.65 0.77 0.82  Sodium 135 - 145 mmol/L 137 134(L) 137  Potassium 3.5 - 5.1 mmol/L 4.0 4.5 4.2  Chloride 98 - 111 mmol/L 102 101 103  CO2 22 - 32 mmol/L 24 24 25   Calcium 8.9 - 10.3 mg/dL 8.9 9.3 8.7(L)  Total Protein 6.0 - 8.3 g/dL - 7.0 -  Total Bilirubin 0.2 - 1.2 mg/dL - 0.5 -  Alkaline Phos 39 - 117 U/L - 114 -  AST 0 - 37 U/L - 17 -  ALT 0 - 35 U/L - 13 -   Colonoscopy 08/29/2017 by Dr. Havery Moros: - Preparation of the colon was inadequate, stool in the sigmoid colon which could not be   cleared and the procedure was aborted.. - Diverticulosis in the sigmoid colon. - The examination was otherwise normal. - No specimens collected.  Colonoscopy 02/13/2012: There was moderate diverticulosis noted throughout the examined colon, narrowing and tortuosity of the sigmoid colon but no evidence of diverticulitis, patient's symptoms seem to be out of proportion to objective findings, suspect irritable bowel syndrome contributing to patient's abdominal pan.   ECHO 01/30/2019: 1. Left ventricular ejection fraction, by visual estimation, is 30 to 35%. The left ventricle has moderate to severely decreased function. There is mildly increased left ventricular hypertrophy. 2. Multiple segmental abnormalities exist. See findings. 3. Left ventricular diastolic Doppler parameters are consistent with impaired  relaxation pattern of LV diastolic filling. 4. Global right ventricle has moderately reduced systolic function.The right ventricular size is normal. No increase in right ventricular wall thickness.  Chest CT 02/02/2019: 1. Mediastinal, right hilar and right internal mammary adenopathy has not significantly changed, present on multiple prior studies. 2. Scattered pulmonary nodularity is stable as well. 3. Stable right adrenal metastasis. Possible new lesion medially in the dome of the right  hepatic lobe, potentially a metastasis. 4. Stable osseous metastatic disease. 5. No acute chest findings.   Current Outpatient Medications on File Prior to Visit  Medication Sig Dispense Refill  . ALPRAZolam (XANAX) 0.25 MG tablet Take 1 tablet (0.25 mg total) by mouth 3 (three) times daily as needed for anxiety. 90 tablet 0  . aspirin EC 81 MG tablet Take 81 mg by mouth daily after breakfast.     . carvedilol (COREG) 12.5 MG tablet Take 1 tablet (12.5 mg total) by mouth 2 (two) times daily with a meal. (Patient taking differently: Take 6.25 mg by mouth 2 (two) times daily with a meal. Take ONE HALF tablet twice daily.) 180 tablet 1  . Cholecalciferol (VITAMIN D PO) Take 1,000 Units by mouth 2 (two) times daily.    Marland Kitchen docusate sodium (COLACE) 100 MG capsule Take 100 mg by mouth daily.    . Glucosamine 500 MG CAPS Take 500 mg by mouth daily.     Marland Kitchen loratadine (CLARITIN) 10 MG tablet Take 1 tablet (10 mg total) by mouth daily. 30 tablet 11  . losartan (COZAAR) 25 MG tablet Take 0.5 tablets (12.5 mg total) by mouth daily. 30 tablet 2  . ondansetron (ZOFRAN ODT) 4 MG disintegrating tablet Take 1 tablet (4 mg total) by mouth every 8 (eight) hours as needed for nausea or vomiting. 20 tablet 0  . spironolactone (ALDACTONE) 25 MG tablet Take 0.5 tablets (12.5 mg total) by mouth daily. 30 tablet 2   No current facility-administered medications on file prior to visit.   Allergies  Allergen Reactions  . Ciprofloxacin Anaphylaxis    Current Medications, Allergies, Past Medical History, Past Surgical History, Family History and Social History were reviewed in [employer] record.  Physical Exam:BP 100/70 (BP Location: Left Arm, Patient Position: Sitting, Cuff Size: Normal)   Pulse 76   Temp (!) 97.5 F (36.4 C)   Ht 5\' 1"  (1.549 m) Comment: height measured without shoes  Wt 123 lb (55.8 kg)   BMI 23.24 kg/m  General: Fatigued appearing female in no acute distress. Head:  Normocephalic and atraumatic. Eyes:  No scleral icterus. Conjunctiva pink . Ears: Normal auditory acuity. Lungs: Clear throughout to auscultation. Heart: Regular rate and rhythm, no murmur. Abdomen: Soft, nondistended. Periumbilical tenderness without rebound or guarding. No masses or hepatomegaly. Normal bowel sounds x 4 quadrants. No bruit.  Rectal: Deferred.  Musculoskeletal: Symmetrical with no gross deformities. Extremities: No edema. Neurological: Alert oriented x 4. No focal deficits.  Psychological:  Alert and cooperative. Normal mood and affect  Assessment and Recommendations:  33. 79 year old female with generalized  abdominal pan which occurs at night. She wishes to pursue an abd/pelvic CT as her abdominal pain interferes with her sleep. -Abdominal/pelvic CT with contrast  -Bacteria probiotic once of choice once daily -Gas X at bed time -Further recommendations to be determined after CT results received -Patient to call our office if her symptoms worsen -Defer pain management to oncology and palliative care   2. Nausea -Ondansetron PRN (patient has supply)  3. Metastatic breast cancer. She has decided against any further interventions/treatments. She is followed by  Oncologist Dr. Jana Hakim and  by palliative care.

## 2019-07-06 ENCOUNTER — Encounter: Payer: Self-pay | Admitting: Nurse Practitioner

## 2019-07-06 ENCOUNTER — Other Ambulatory Visit: Payer: Self-pay | Admitting: *Deleted

## 2019-07-06 ENCOUNTER — Ambulatory Visit (INDEPENDENT_AMBULATORY_CARE_PROVIDER_SITE_OTHER): Payer: Medicare Other | Admitting: Nurse Practitioner

## 2019-07-06 ENCOUNTER — Other Ambulatory Visit: Payer: Self-pay

## 2019-07-06 VITALS — BP 100/70 | HR 76 | Temp 97.5°F | Ht 61.0 in | Wt 123.0 lb

## 2019-07-06 DIAGNOSIS — R1033 Periumbilical pain: Secondary | ICD-10-CM

## 2019-07-06 DIAGNOSIS — Z853 Personal history of malignant neoplasm of breast: Secondary | ICD-10-CM

## 2019-07-06 DIAGNOSIS — R1084 Generalized abdominal pain: Secondary | ICD-10-CM | POA: Diagnosis not present

## 2019-07-06 NOTE — Patient Instructions (Addendum)
If you are age 79 or older, your body mass index should be between 23-30. Your Body mass index is 23.24 kg/m. If this is out of the aforementioned range listed, please consider follow up with your Primary Care Provider.  If you are age 22 or younger, your body mass index should be between 19-25. Your Body mass index is 23.24 kg/m. If this is out of the aformentioned range listed, please consider follow up with your Primary Care Provider.    Please purchase the following medications over the counter and take as directed:  Probiotic of choice once daily. Gas X 1 tablet at bedtime.  You are scheduled on 07/14/2019 at 2:30 pm at [facility]. You should arrive 15 minutes prior to your appointment time for registration. Please follow the written instructions below on the day of your exam:  WARNING: IF YOU ARE ALLERGIC TO IODINE/X-RAY DYE, PLEASE NOTIFY RADIOLOGY IMMEDIATELY AT [phone]! YOU WILL BE GIVEN A 13 HOUR PREMEDICATION PREP.  1) Do not eat or drink anything after 10:30 am (4 hours prior to your test) 2) You have been given 2 bottles of oral contrast to drink. The solution may taste better if refrigerated, but do NOT add ice or any other liquid to this solution. Shake well before drinking.    Drink 1 bottle of contrast @ 12:30 pm (2 hours prior to your exam)  Drink 1 bottle of contrast @ 1:30 pm (1 hour prior to your exam)  You may take any medications as prescribed with a small amount of water, if necessary. If you take any of the following medications: METFORMIN, GLUCOPHAGE, GLUCOVANCE, AVANDAMET, RIOMET, FORTAMET, [place] MET, JANUMET, GLUMETZA or METAGLIP, you MAY be asked to HOLD this medication 48 hours AFTER the exam.  The purpose of you drinking the oral contrast is to aid in the visualization of your intestinal tract. The contrast solution may cause some diarrhea. Depending on your individual set of symptoms, you may also receive an intravenous injection of x-ray  contrast/dye. Plan on being at [facility] for 30 minutes or longer, depending on the type of exam you are having performed.  This test typically takes 30-45 minutes to complete.  If you have any questions regarding your exam or if you need to reschedule, you may call the CT department at [phone] between the hours of 8:00 am and 5:00 pm, Monday-Friday.  ________________________________________________________________________ Due to recent changes in healthcare laws, you may see the results of your imaging and laboratory studies on MyChart before your provider has had a chance to review them.  We understand that in some cases there may be results that are confusing or concerning to you. Not all laboratory results come back in the same time frame and the provider may be waiting for multiple results in order to interpret others.  Please give [place] 48 hours in order for your provider to thoroughly review all the results before contacting the office for clarification of your results.   Thank you for choosing [facility] Noralyn Pick, CRNP

## 2019-07-06 NOTE — Patient Outreach (Signed)
  [facility]) Care Management  07/06/2019  Erica Young [birth year] [id number]  Outreach from complex care patient   Patient called [facility] RN CM Patient is able to verify HIPAA, DOB and Address  Consent: [facility] RN CM reviewed [facility] services with patient. Patient gave verbal consent for [facility] telephonic RN CM services.  [name] informs [facility] RN CM she called [facility] RN CM but did not leave a message recently  [name] reports that after the conclusion of the 07/02/19 outreach she was able to realize who [facility] RN CM was [name] calls to voice appreciation for [facility] RN CM outreach on 07/02/19 [facility] RN CM informed Mrs Lodico she was welcome. [facility] RN CM reviewed then 07/02/19 [facility] outreach with then noted active palliative care services.  [name] confirms after [facility] RN CM's inquiry today that she does have palliative care services, had a visit from palliative care [place] on 06/22/19 and presently is receiving weekly calls from "[place]"  She confirms she has an appointment to speak with "Joelene Millin" on 07/07/19 at 1 pm  [name] reports that with "everything I have going on I get confused", "I get lots of calls" [name] again confirms she does not think [facility] RN CM can assist her at this time as she progresses without oncology treatment [name] again today confirms she does not prefer to have oncology treatment "so I have to suffer"  [facility] RN CM offered interventions palliative care providers are available to assist with as she progresses like comfort care prn. She voiced understanding. [facility] RN CM commended [name] for agreeing and participating to palliative care services. [name] inquired if palliative care was covered by medicare and [facility] RN CM discussed this with her.   [facility] RN CM discuss [facility] services available while palliative care services are active. [name] voiced understanding and agreed to follow up services from [facility] to inquire if any [facility] services or support needed   Plan [facility] RN  CM will follow up with [name] for a follow up outreach within the next 60- 75 days as she agrees to on today to offer any support possible. She continues to have palliative care services who provides primary care coordination and support at this time  Straughn. Lavina Hamman, RN, BSN, [place] Coordinator Office number [phone] Mobile number [phone]  Main [facility] number [phone] Fax number [phone]

## 2019-07-07 NOTE — Progress Notes (Signed)
Agree with assessment and plan as outlined.  

## 2019-07-08 ENCOUNTER — Encounter: Payer: Self-pay | Admitting: Internal Medicine

## 2019-07-09 ENCOUNTER — Other Ambulatory Visit: Payer: Self-pay | Admitting: Internal Medicine

## 2019-07-09 DIAGNOSIS — C50411 Malignant neoplasm of upper-outer quadrant of right female breast: Secondary | ICD-10-CM

## 2019-07-09 DIAGNOSIS — Z170 Estrogen receptor positive status [ER+]: Secondary | ICD-10-CM

## 2019-07-10 ENCOUNTER — Telehealth: Payer: Self-pay | Admitting: *Deleted

## 2019-07-10 NOTE — Telephone Encounter (Signed)
Pt is calling in stating that she is out of her Rx alprazolam (XANAX) 0.25 MG and would like to see if she can get it called in today.  Pt is aware that Dr. Jerilee Hoh is not in the office but I will send the message back.  Pharm:  [place] [facility] [employer]

## 2019-07-10 NOTE — Telephone Encounter (Signed)
Called and spoke with patient to arrange a Palliative care home visit. Visit scheduled for 07/21/19 at [place].

## 2019-07-13 ENCOUNTER — Telehealth: Payer: Self-pay | Admitting: Internal Medicine

## 2019-07-13 ENCOUNTER — Telehealth: Payer: Self-pay | Admitting: *Deleted

## 2019-07-13 NOTE — Telephone Encounter (Signed)
2:18p Received a phone call from patient stating that she is extremely dizzy with standing and ambulation and having difficulties getting around because of this. She says that she was on a medication in the past for dizziness, but was unsure of what it was. She requests that I contact her PCP to make aware and see if something can be prescribed.   2:27p Called Dr. Cresenciano Lick office to make aware of above noted information. Receptionist advised she will send a message to MD with patient's concerns.

## 2019-07-13 NOTE — Telephone Encounter (Signed)
Daryl Eastern nurse from [place] stated that the pt told her she is expeirncing extreme dizziness and it is making it hard for her to get around. Pt also told her that she was prescribed medicine in the past that helped her with the dizziness and wondering if her PCP will send in a prescription at [facility] Dr. [phone])   Pt can be reached at [phone]

## 2019-07-14 ENCOUNTER — Encounter (HOSPITAL_COMMUNITY): Payer: Self-pay

## 2019-07-14 ENCOUNTER — Ambulatory Visit (HOSPITAL_COMMUNITY)
Admission: RE | Admit: 2019-07-14 | Discharge: 2019-07-14 | Disposition: A | Discharge status: Home / Self Care | Payer: Medicare Other | Source: Ambulatory Visit | Attending: Nurse Practitioner | Admitting: Nurse Practitioner

## 2019-07-14 ENCOUNTER — Other Ambulatory Visit: Payer: Self-pay

## 2019-07-14 DIAGNOSIS — R1033 Periumbilical pain: Secondary | ICD-10-CM | POA: Diagnosis not present

## 2019-07-14 DIAGNOSIS — C7971 Secondary malignant neoplasm of right adrenal gland: Secondary | ICD-10-CM | POA: Diagnosis not present

## 2019-07-14 DIAGNOSIS — Z853 Personal history of malignant neoplasm of breast: Secondary | ICD-10-CM | POA: Diagnosis not present

## 2019-07-14 DIAGNOSIS — R1084 Generalized abdominal pain: Secondary | ICD-10-CM

## 2019-07-14 MED ORDER — IOHEXOL 300 MG/ML  SOLN
100.0000 mL | Freq: Once | INTRAMUSCULAR | Status: AC | PRN
  Administered 2019-07-14: 100 mL via INTRAVENOUS

## 2019-07-14 MED ORDER — SODIUM CHLORIDE (PF) 0.9 % IJ SOLN
INTRAMUSCULAR | Status: AC
  Filled 2019-07-14: qty 50

## 2019-07-14 MED ORDER — MECLIZINE HCL 25 MG PO TABS: 25.0000 mg | ORAL_TABLET | Freq: Three times a day (TID) | ORAL | 0 refills | Status: DC | PRN

## 2019-07-14 NOTE — Telephone Encounter (Signed)
Patient is aware of the Rx and will call back as needed.

## 2019-07-16 ENCOUNTER — Telehealth: Payer: Self-pay | Admitting: Internal Medicine

## 2019-07-16 NOTE — Telephone Encounter (Signed)
Spoke with patient and explained looked as though when she was d/c from hospital in October  Patient was in facility and did not know about appointment  Advised would send message to IT to see about getting removed Did offer to schedule appointment for follow up secondary, patient declined Advised to discuss possible follow up with PCP, verbalized understanding

## 2019-07-16 NOTE — Telephone Encounter (Signed)
Patient states she is not a patient of Dr. Debara Pickett, nor has she ever seen him in our office; however, she has a past appointment with the status of "no show" for 05/07/19 at 9:00 AM with Dr. Debara Pickett in error. Please assist.

## 2019-07-17 DIAGNOSIS — Z23 Encounter for immunization: Secondary | ICD-10-CM | POA: Diagnosis not present

## 2019-07-20 ENCOUNTER — Encounter: Payer: Self-pay | Admitting: Oncology

## 2019-07-21 ENCOUNTER — Telehealth: Payer: Self-pay | Admitting: Oncology

## 2019-07-21 ENCOUNTER — Other Ambulatory Visit: Payer: Medicare Other | Admitting: *Deleted

## 2019-07-21 ENCOUNTER — Other Ambulatory Visit: Payer: Self-pay

## 2019-07-21 DIAGNOSIS — Z515 Encounter for palliative care: Secondary | ICD-10-CM

## 2019-07-21 NOTE — Telephone Encounter (Signed)
Scheduled appt 3/22 sch message - pt aware of appt date and time

## 2019-07-22 NOTE — Progress Notes (Signed)
COMMUNITY PALLIATIVE CARE NOTE  PATIENT NAME: Erica Young  DOB: 1940-08-01 MRN: [id number]  PRIMARY CARE PROVIDER: Isaac Bliss, Rayford Halsted, MD  RESPONSIBLE PARTY:  Acct ID - Guarantor Home Phone Work Phone Relationship Acct Type  [id number] Durwin [phone]  Self P/F     [place], [place], [place] [address]   Covid-19 Pre-screening Negative  PLAN OF CARE and INTERVENTION:  1. ADVANCE CARE PLANNING/GOALS OF CARE: Goal is for patient to feel better and avoid hospitalizations. Gave patient a signed DNR form to have in the home.  2. PATIENT/CAREGIVER EDUCATION: Pain management, symptom management, safe mobility 3. DISEASE STATUS: Met with patient in her home. Upon arrival, patient answered the door and says that she is not feeling well. Her gait is unsteady and she is holding onto the walls and furniture for stability. She walks at a slow pace and is very careful not to fall. She does not like using the walker, as she feels it is more unsafe than walking without it. She c/o being dizzy all the time and low energy. She now has Meclizine to take to help with dizziness, but she says it is ineffective so she stopped taking this. She also c/o diplopia and blurred vision. 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 She declined treatment as she was told that radiation could cause her to become blind and surgery would cause her nose to be deformed, so she decided against this. She is no longer driving. She does have a runny nose, especially during the night. Some of the drainage is blood tinged at times. She continues to take Claritin daily. She c/o generalized pain and says that her pain moves to various places throughout her body, then will go away all of a sudden. She does not want to take any pain medications unless pain becomes intolerable. She has been having issues with constipation alternating with loose stools at times. However lately she  states that she has a bowel movement several times per day, but it is formed. She says that she received bad news from her recent Abdominal CT scan. 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 She has an appointment at the cancer center next week to discuss findings further. She is having difficulties sleeping at night d/t having so much on her mind. She continues to have counseling sessions with [facility] weekly, which has been helpful. She does eat 3 small meals/day and tries to drink Ensure daily for nutritional supplementation.She received papers regarding Advanced directives (Living Will and HCPOA) from the cancer center. I did assist patient with questions she had regarding filling out this paperwork. She also says that she wants to be a DNR. Provided patient with a copy to keep on her refrigerator. She is appreciative of visit. Will continue to monitor.    HISTORY OF PRESENT ILLNESS:  This is a 79 yo female who resides at home alone. She has a very supportive family. Palliative care to continue to follow patient and visit monthly and PRN.  CODE STATUS: DNR ADVANCED DIRECTIVES: N (will take completed form with her to her MD appointment on 07/27/19) Needs notarized and 2 witnesses MOST FORM: no PPS: 50%   PHYSICAL EXAM:   VITALS: Today's Vitals   07/21/19 1037  BP: (!) 98/59  Pulse: 76  Resp: 18  Temp: (!) 97.5 F (36.4 C)  TempSrc: Temporal  SpO2: 96%  PainSc: 4   PainLoc: Generalized  LUNGS: clear to auscultation  CARDIAC: Cor RRR EXTREMITIES: No edema SKIN: Exposed skin is dry and intact  NEURO: Alert and oriented x 3, flat affect, increased generalized weakness, unsteady gait, ambulatory   (Duration of visit and documentation 90 minutes)   Daryl Eastern, RN BSN

## 2019-07-24 ENCOUNTER — Telehealth: Payer: Self-pay | Admitting: Internal Medicine

## 2019-07-26 NOTE — Progress Notes (Signed)
[place]  [phone] [phone]     ID: [name] DOB: 19-Sep-1940  MR#: [id number]  YBW#:[id number]  Patient Care Team: Isaac Bliss, Rayford Halsted, MD as PCP - General (Internal Medicine) Debara Pickett Nadean Corwin, MD as PCP - Cardiology (Cardiology) Tanda Rockers, MD as Consulting Physician (Pulmonary Disease) Venna Berberich, Virgie Dad, MD as Consulting Physician (Oncology) Armbruster, Carlota Raspberry, MD as Consulting Physician (Gastroenterology) Bobbitt, Sedalia Muta, MD as Consulting Physician (Allergy and Immunology) Barbaraann Faster, RN as [place] Management Rozetta Nunnery, MD as Consulting Physician (Otolaryngology) Gery Pray, MD as Consulting Physician (Radiation Oncology) OTHER MD:   CHIEF COMPLAINT: 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 She is currently under observation.  Since her last visit, she met with Dr. Sondra Come on 05/20/2019 to discuss radiation therapy to her nasal cavity mass. Per Dr. Clabe Seal note, the patient remained unsure as to whether or not she wanted to pursue radiation therapy at that time.  She accordingly was not treated and is now having more symptoms.  She says that only one nostril is open and if not it can close off and then it makes her breathing more difficult.  She is blowing some mucus with dried blood at times.  All this worsens her insomnia.  On the plus side her headache is gone she says.  She presented to the ED on 06/28/2019 with left-sided dental pain and diplopia. At that time, she declined imaging studies and pain medication. However, she did accept referral to a dentist.  She presented to her gastroenterologist with generalized abdominal pain. She underwent CT abdomen/pelvis on 07/14/2019, which showed:  interval progression of bone metastasis involving L1 and L5 with posterior extension of tumor into ventral canal; interval increase in size of heterogeneous lesion within dome of liver, worrisome for metastatic disease; stable right adrenal gland metastasis; stable subpleural nodule within lingula.   REVIEW OF SYSTEMS: Jeanny continues to live by herself.  She does not like to bother her son who lives here in town as she says he has significant disabilities and also children in a very busy life.  She is walking in the home and not outside.  She is afraid of falling and is very careful regarding that.  She has significant pain in her lower back and it runs down her right leg primarily.  This is worse when she lies down and better when she stands.  She is concerned that her insurance is changed from Medicare/[employer] to [facility] and this may mean she has a higher co-pay she says.  She has lost 10 pounds since November 2020    BREAST CANCER HISTORY: From the earlier summary note  Rosezella tells me in 2007 while living in [place] she was found to have a very small cancer in the upper-outer quadrant of the right breast, about the size of the P, noted on mammography. It was not palpable. She had a right lumpectomy and full sentinel lymph node sampling. She then received adjuvant radiation, to a total of 33 treatments. She received no systemic therapy.  She then did well until December 2014, when she had a fall and complain of pain in her left ribs. Rib films and chest x-ray on 04/13/2013 showed no fractures, but CT of the abdomen and pelvis on the same day found  subcarinal and right hilar adenopathy. This was followed up with a chest CT scan 05/05/2013 confirming extensive mediastinal and right hilar lymphadenopathy, with multiple pulmonary nodules, the largest measuring 0.9 cm. PET scan 05/21/2013 showed hypermetabolic adenopathy in the right and left paratracheal areas, the precarinal, subcarinal and  right hilar areas, but no involvement of the liver, and the lung nodules were not hypermetabolic (although they were below the level of reliable detection). In addition, at L5 there was a lucency measuring 1.2 cm.  The PET scan also showed a hypermetabolic focus on the thyroid gland, which was evaluated with neck ultrasound and biopsy 01/60/1093, showing a follicular lesion of undetermined significance (([id number]).  The patient was referred to pulmonary [Dr Melvyn Novas and Dr Julien Nordmann for further evaluation. Repeat PET scan 12/28/2013 showed, in addition to the adenopathy previously noted, now multiple bony metastases. On 01/07/2014 the patient underwent bronchoscopy and this showed ([id number], together with separate cytology] a low-grade mucinous invasive breast cancer, estrogen receptor 100% positive, progesterone receptor 14% positive, with no HER-2 amplification, the signals ratio being 1.30 and the number per cell 1.95.  The patient was started on anastrozole 01/22/2014; monthly denosumab/Xgeva was added 07/30/2014. She appeared to tolerate this well, and her CA-27-29 (127 at baseline) normalized. Most recent CT scans of chest abdomen and pelvis 10/03/2015 showed continuing response. Despite this good news however, the patient decided to go off anastrozole and denosumab/Xgeva as of June 2017. By the time she saw Dr. Earlie Server again in September 2017 her tumor marker had doubled. At that time the patient was referred to the breast clinic for a second opinion regarding further evaluation and treatment.   PAST MEDICAL HISTORY: Past Medical History:  Diagnosis Date  . Allergy   . Anxiety   . Bone cancer ([place]) mri 11/11/14   right parietal bone,left cribriform plate metastases  . Cancer [facility]) 2007   right breat ca  , lumpectomy and radiation tx (declined chemo and additional prophylactic meds)  . Cataract    both eyes  10/2016 Lt.     04/2017 Rt.   . CEREBROVASCULAR DISEASE 03/03/2010  . Diverticulitis    . DIVERTICULOSIS, COLON 03/03/2010  . Fatty liver 08/02/09   as per U/S done by [facility]  . Foramen ovale    positive bubble study  . GERD (gastroesophageal reflux disease)   . Headache(784.0)    she thinks sinus headaches  . History of cerebral artery stenosis    right middle  . HYPERLIPIDEMIA 03/03/2010  . Hypertension   . HYPOTHYROIDISM 03/03/2010   no longer on meds  . Internal hemorrhoid   . Low back pain   . Mass of sinus   . Mastoiditis    noted on brain MRI  . Rib fracture   . Right leg pain   . Stroke [facility]) Oct. 2011   TIA  . Ulcer     PAST SURGICAL HISTORY: Past Surgical History:  Procedure Laterality Date  . BREAST LUMPECTOMY     right  . CATARACT EXTRACTION Left    10/2016   Rt 04/2017  . CHOLECYSTECTOMY    . COLONOSCOPY    . POLYPECTOMY     benign cecum  . SHOULDER SURGERY     right shoulder (dislocation)  . TONSILLECTOMY    . TUBAL LIGATION    . UPPER GASTROINTESTINAL ENDOSCOPY    . VIDEO BRONCHOSCOPY WITH ENDOBRONCHIAL ULTRASOUND N/A 01/07/2014   Procedure: VIDEO BRONCHOSCOPY WITH ENDOBRONCHIAL ULTRASOUND;  Surgeon: Tharon Aquas  Kerby Less, MD;  Location: [facility] OR;  Service: Thoracic;  Laterality: N/A;    FAMILY HISTORY Family History  Problem Relation Age of Onset  . Heart disease Sister        cerebral vascular disease also  . Heart disease Brother        cerebral vascular disease also  . Heart disease Brother        cerebral vascular disease  . Heart disease Sister        cebreal vascular disease also  . Heart disease Sister        cebreal vascular diease also  . Heart disease Sister        cerebral vascular diease also  . Stomach cancer Father   . Colon cancer Neg Hx   . Esophageal cancer Neg Hx   . Rectal cancer Neg Hx   . Colon polyps Neg Hx   . Asthma Neg Hx   The patient's father died from stomach cancer in his late 62s. The patient's mother died in her 8s from a stroke. The patient has 2 brothers, 4 sisters. One sister had  leukemia. There is no history of breast, colon, or ovarian cancer in the family to her knowledge   GYNECOLOGIC HISTORY:  No LMP recorded. Patient is postmenopausal. Menarche age 51, first live birth age 56, the patient is Con[place]ion P2. She went through menopause in her late 85s. She took no hormone replacement. She took oral contraceptives for approximately 2 years remotely without complications.   SOCIAL HISTORY: (updated 03/2019) Rambo is originally from [place], [place]. She worked as a Education officer, museum, particularly, in the field of substance abuse. Her husband, Mortimer Fries, was a retired substance abuse Social worker. He unfortunately passed away in late October/early November 2020. Polinsky has 2 children from her first marriage, [name], who lives in [place], and worked as a Audiological scientist but is now disabled, and Lawrence Santiago, who lives in [place] and works in Engineer, mining. Mortimer Fries has 3 children from his first marriage, Tanna Furry, [place], and [place]. [name] Junior lives in [place] and has his own trucking business. Mikki Santee tells me he is estranged from his 2 daughters Amedeo Gory (a retired Pharmacist, hospital) and Salena Saner (who works in a bank). They are living on [place].The patient has 8 grandchildren. Petkus attends [employer].   ADVANCED DIRECTIVES:    HEALTH MAINTENANCE: Social History   Tobacco Use  . Smoking status: Former Smoker    Packs/day: 0.50    Years: 20.00    Pack years: 10.00    Types: Cigarettes    Start date: 57    Quit date: 05/01/1991    Years since quitting: 28.2  . Smokeless tobacco: Never Used  Substance Use Topics  . Alcohol use: No    Alcohol/week: 0.0 standard drinks  . Drug use: No     Colonoscopy:  PAP:  Bone density:   Allergies  Allergen Reactions  . Ciprofloxacin Anaphylaxis    Current Outpatient Medications  Medication Sig Dispense Refill  . ALPRAZolam (XANAX) 0.25 MG tablet TAKE 1 TABLET BY MOUTH THREE TIMES DAILY AS NEEDED FOR  ANXIETY 90 tablet 0  . aspirin EC 81 MG tablet Take 81 mg by mouth daily after breakfast.     . carvedilol (COREG) 12.5 MG tablet Take 1 tablet (12.5 mg total) by mouth 2 (two) times daily with a meal. (Patient taking differently: Take 6.25 mg by mouth 2 (two) times daily with a meal. Take ONE HALF  tablet twice daily.) 180 tablet 1  . Cholecalciferol (VITAMIN D PO) Take 1,000 Units by mouth 2 (two) times daily.    Marland Kitchen docusate sodium (COLACE) 100 MG capsule Take 100 mg by mouth daily.    Marland Kitchen exemestane (AROMASIN) 25 MG tablet Take 1 tablet (25 mg total) by mouth daily after breakfast. 60 tablet 6  . Glucosamine 500 MG CAPS Take 500 mg by mouth daily.     Marland Kitchen loratadine (CLARITIN) 10 MG tablet Take 1 tablet (10 mg total) by mouth daily. 30 tablet 11  . losartan (COZAAR) 25 MG tablet Take 0.5 tablets (12.5 mg total) by mouth daily. 30 tablet 2  . meclizine (ANTIVERT) 25 MG tablet Take 1 tablet (25 mg total) by mouth 3 (three) times daily as needed for dizziness. 15 tablet 0  . ondansetron (ZOFRAN ODT) 4 MG disintegrating tablet Take 1 tablet (4 mg total) by mouth every 8 (eight) hours as needed for nausea or vomiting. 20 tablet 0  . spironolactone (ALDACTONE) 25 MG tablet Take 0.5 tablets (12.5 mg total) by mouth daily. 30 tablet 2   No current facility-administered medications for this visit.    OBJECTIVE: Middle-aged Latin American woman who appears stated age  38:   07/27/19 1349  BP: 127/64  Pulse: 79  Resp: 19  Temp: 98.3 F (36.8 C)  SpO2: 98%     Body mass index is 23.03 kg/m.    ECOG FS:1 - Symptomatic but completely ambulatory  Sclerae unicteric, EOMs intact Wearing a mask No cervical or supraclavicular adenopathy Lungs no rales or rhonchi Heart regular rate and rhythm Abd soft, nontender, positive bowel sounds, no masses palpated MSK no focal spinal tenderness including over lumbar area to moderate palpation Neuro: nonfocal, well oriented, anxious affect Breasts:  Deferred  LAB RESULTS:  CMP     Component Value Date/Time   NA 137 06/28/2019 1358   NA 138 04/18/2017 0853   K 4.0 06/28/2019 1358   K 4.4 04/18/2017 0853   CL 102 06/28/2019 1358   CO2 24 06/28/2019 1358   CO2 29 04/18/2017 0853   GLUCOSE 95 06/28/2019 1358   GLUCOSE 95 04/18/2017 0853   BUN 15 06/28/2019 1358   BUN 16.6 04/18/2017 0853   CREATININE 0.65 06/28/2019 1358   CREATININE 0.77 01/29/2019 0855   CREATININE 0.8 04/18/2017 0853   CALCIUM 8.9 06/28/2019 1358   CALCIUM 9.2 04/18/2017 0853   PROT 7.0 06/24/2019 0808   PROT 7.0 04/18/2017 0853   ALBUMIN 3.8 06/24/2019 0808   ALBUMIN 3.8 04/18/2017 0853   AST 17 06/24/2019 0808   AST 16 01/29/2019 0855   AST 17 04/18/2017 0853   ALT 13 06/24/2019 0808   ALT 13 01/29/2019 0855   ALT 16 04/18/2017 0853   ALKPHOS 114 06/24/2019 0808   ALKPHOS 90 04/18/2017 0853   BILITOT 0.5 06/24/2019 0808   BILITOT 0.3 01/29/2019 0855   BILITOT 0.63 04/18/2017 0853   GFRNONAA >60 06/28/2019 1358   GFRNONAA >60 01/29/2019 0855   GFRAA >60 06/28/2019 1358   GFRAA >60 01/29/2019 0855    INo results found for: SPEP, UPEP  Lab Results  Component Value Date   WBC 5.8 06/28/2019   NEUTROABS 3.8 06/28/2019   HGB 14.0 06/28/2019   HCT 42.2 06/28/2019   MCV 96.8 06/28/2019   PLT 220 06/28/2019      Chemistry      Component Value Date/Time   NA 137 06/28/2019 1358   NA 138 04/18/2017 0853  K 4.0 06/28/2019 1358   K 4.4 04/18/2017 0853   CL 102 06/28/2019 1358   CO2 24 06/28/2019 1358   CO2 29 04/18/2017 0853   BUN 15 06/28/2019 1358   BUN 16.6 04/18/2017 0853   CREATININE 0.65 06/28/2019 1358   CREATININE 0.77 01/29/2019 0855   CREATININE 0.8 04/18/2017 0853      Component Value Date/Time   CALCIUM 8.9 06/28/2019 1358   CALCIUM 9.2 04/18/2017 0853   ALKPHOS 114 06/24/2019 0808   ALKPHOS 90 04/18/2017 0853   AST 17 06/24/2019 0808   AST 16 01/29/2019 0855   AST 17 04/18/2017 0853   ALT 13 06/24/2019 0808   ALT  13 01/29/2019 0855   ALT 16 04/18/2017 0853   BILITOT 0.5 06/24/2019 0808   BILITOT 0.3 01/29/2019 0855   BILITOT 0.63 04/18/2017 0853      No components found for: [id number]  No results for input(s): INR in the last 168 hours.  Urinalysis    Component Value Date/Time   COLORURINE STRAW (A) 11/19/2016 1750   APPEARANCEUR CLEAR 11/19/2016 1750   LABSPEC 1.003 (L) 11/19/2016 1750   PHURINE 7.0 11/19/2016 1750   GLUCOSEU NEGATIVE 11/19/2016 1750   HGBUR NEGATIVE 11/19/2016 1750   BILIRUBINUR NEGATIVE 11/19/2016 1750   BILIRUBINUR neg 02/16/2015 1318   KETONESUR NEGATIVE 11/19/2016 1750   PROTEINUR NEGATIVE 11/19/2016 1750   UROBILINOGEN 0.2 02/16/2015 1318   UROBILINOGEN 0.2 11/18/2013 1321   NITRITE NEGATIVE 11/19/2016 1750   LEUKOCYTESUR TRACE (A) 11/19/2016 1750     STUDIES: CT Abdomen Pelvis W Contrast  Result Date: 07/15/2019 CLINICAL DATA:  generalized abdominal pain. History of breast cancer. EXAM: CT ABDOMEN AND PELVIS WITH CONTRAST TECHNIQUE: Multidetector CT imaging of the abdomen and pelvis was performed using the standard protocol following bolus administration of intravenous contrast. CONTRAST:  184m OMNIPAQUE IOHEXOL 300 MG/ML  SOLN COMPARISON:  CT AP 11/19/2016 in CT chest 02/02/19 FINDINGS: Lower chest: No acute abnormality identified. Subpleural nodule within the lingula measures 1 cm and is unchanged from previous exam, image number 5/6. Hepatobiliary: Heterogeneous low-density lesion in the posteromedial dome of liver measures 3.1 cm. On the previous exam from 02/02/2019 this measured 0.8 cm. Cholecystectomy. No intrahepatic bile duct dilatation. Mild increase caliber of the CBD which measures 0.7 cm. Pancreas: Unremarkable. No pancreatic ductal dilatation or surrounding inflammatory changes. Spleen: Normal in size without focal abnormality. Adrenals/Urinary Tract: Normal left adrenal gland. Right adrenal metastasis measures 2.8 x 1.8 cm, image 21/2. Stable from  previous exam. Several small right kidney cysts. No mass or hydronephrosis identified. Urinary bladder is unremarkable. Stomach/Bowel: Stomach is within normal limits. Appendix appears normal. No evidence of bowel wall thickening, distention, or inflammatory changes. Sigmoid diverticulosis. Vascular/Lymphatic: Aortic atherosclerosis. No adenopathy. Reproductive: Uterus and bilateral adnexa are unremarkable. Other: No free fluid or fluid collections. Musculoskeletal: Mixed lytic and sclerotic bone metastases are identified involving L1 and L5. Tumor involving the L1 vertebra extends into the right posterior elements as well as the ventral aspect of the canal, image 31/2. Similarly, at L5 there is posterior extension of tumor into the ventral canal, image 49/5. Compared with 11/19/2016 tumor involvement at these levels has progressed. IMPRESSION: 1. Interval progression of bone metastasis involving L1 and L5 with posterior extension of tumor into the ventral canal at these levels. 2. Interval increase in size of heterogeneous lesion within the dome of liver worrisome for metastatic disease. 3. Stable right adrenal gland metastasis. 4. Stable subpleural nodule within the lingula measuring  1 cm. Aortic Atherosclerosis (ICD10-I70.0). Electronically Signed   By: Kerby Moors M.D.   On: 07/15/2019 09:27     ELIGIBLE FOR AVAILABLE RESEARCH PROTOCOL: no  ASSESSMENT: 79 y.o. [place] woman  (1) status post right breast upper outer quadrant lumpectomy and axillary lymph node dissection in 2007 for what appears to have been a T1 N0, stage IA invasive ductal carcinoma, treated adjuvantly with radiation (33 sessions)  METASTATIC DISEASE definitively documented Sept 2015 (2) bronchoscopic biopsy 01/07/2014 showed a low-grade mucinous breast cancer, strongly estrogen receptor positive, progesterone receptor positive, HER-2 negative; staging studies confirmed extensive hypermetabolic adenopathy, multiple bone lesions,  likely early lung involvement, but no liver lesions.  (a) bone scan and chest CT 07/09/2016 shows stable disease.  (b) CA-27-29 is moderately informative  (c) bone scan and CT scan of the chest 04/24/2017 shows essentially stable disease  (3) on Arimidex between September 2015 and June 2017, with evidence of response; discontinued secondary to side effects  (a) bone density 04/10/2016 shows osteopenia with a T score of -2.1  (4) on monthly denosumab/Xgeva between 07/30/2014 and 10/04/2015, discontinued due to patient's concerns regarding osteonecrosis of the jaw  (a) discussed again October 2017, the patient adamantly refusing denosumab  (5) started fulvestrant 01/26/2016, stopped after 01/29/2019 dose at patient's request   (a) Palbociclib added at 75 mg M/W/F, beginning mid February 2018, also stopped OCT 2020  (b) chest CT scan obtained 12/02/2017 shows small but measurable growth of lung lesions; bones are stable  (c) palbociclib dose increased to 75 mg daily 21/7 beginning 12/23/2017  (d) palbociclib and fulvestrant held after 01/29/2019 fulvestrant dose, which was associated with syncope.  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  At the same time she is not actively dying and certainly does not look like she would die anytime soon.  Even if the disease in the liver is left untreated it is likely to be a year or more before she would start to have significant disabilities from that.  She is having significant pain from the lumbar metastases and I think she would benefit from adjuvant radiation to that area.  I have placed the referral to radiation oncology and she is  agreeable.  If she wishes to reconsider the possibility of radiation to the sinus mass, I would encourage that since it also may well control some of the symptoms she is experiencing in the evening with breathing.  I suggested she start exemestane to control the liver lesion.  She initially said yes but later told by nurse that she is thinking that she does not want that treatment.  In any case I have placed the prescription in for her after discussing the possible toxicities side effects and complications.  I have encouraged her to make sure to keep her son abreast of her situation.  I have let our social worker know that she is going to need help with transportation.  She will see me again in about 8 weeks.  She knows to call us earlier as needed.  Total encounter time 35 minutes.Sarajane Jews C. Quill Grinder, MD  07/27/19 4:20 PM Medical Oncology and Hematology [facility]  [place], [place] [address] Tel. [phone]    Fax. [phone]   I, Wilburn Mylar, am acting as scribe for Dr. Virgie Dad. Charita Lindenberger.  I, Lurline Del MD, have reviewed the above documentation for accuracy and completeness, and I agree with the above.   *Total Encounter Time as defined by the Centers for Medicare and Medicaid Services includes, in addition to the face-to-face time of a patient visit (documented in the note above) non-face-to-face time: obtaining and reviewing outside history, ordering and reviewing medications, tests or procedures, care coordination (communications with other health care professionals or caregivers) and documentation in the medical record.

## 2019-07-27 ENCOUNTER — Inpatient Hospital Stay: Payer: Medicare Other | Attending: Adult Health | Admitting: Oncology

## 2019-07-27 ENCOUNTER — Telehealth: Payer: Self-pay | Admitting: Oncology

## 2019-07-27 ENCOUNTER — Other Ambulatory Visit: Payer: Self-pay

## 2019-07-27 VITALS — BP 127/64 | HR 79 | Temp 98.3°F | Resp 19 | Ht 61.0 in | Wt 121.9 lb

## 2019-07-27 DIAGNOSIS — G4700 Insomnia, unspecified: Secondary | ICD-10-CM | POA: Diagnosis not present

## 2019-07-27 DIAGNOSIS — C7951 Secondary malignant neoplasm of bone: Secondary | ICD-10-CM | POA: Insufficient documentation

## 2019-07-27 DIAGNOSIS — Z170 Estrogen receptor positive status [ER+]: Secondary | ICD-10-CM

## 2019-07-27 DIAGNOSIS — C50411 Malignant neoplasm of upper-outer quadrant of right female breast: Secondary | ICD-10-CM | POA: Diagnosis present

## 2019-07-27 DIAGNOSIS — C787 Secondary malignant neoplasm of liver and intrahepatic bile duct: Secondary | ICD-10-CM | POA: Diagnosis not present

## 2019-07-27 DIAGNOSIS — M545 Low back pain: Secondary | ICD-10-CM | POA: Diagnosis not present

## 2019-07-27 DIAGNOSIS — C50911 Malignant neoplasm of unspecified site of right female breast: Secondary | ICD-10-CM | POA: Insufficient documentation

## 2019-07-27 DIAGNOSIS — Z87891 Personal history of nicotine dependence: Secondary | ICD-10-CM | POA: Insufficient documentation

## 2019-07-27 DIAGNOSIS — R634 Abnormal weight loss: Secondary | ICD-10-CM | POA: Insufficient documentation

## 2019-07-27 DIAGNOSIS — K769 Liver disease, unspecified: Secondary | ICD-10-CM | POA: Diagnosis not present

## 2019-07-27 DIAGNOSIS — J329 Chronic sinusitis, unspecified: Secondary | ICD-10-CM | POA: Diagnosis not present

## 2019-07-27 MED ORDER — EXEMESTANE 25 MG PO TABS: 25.0000 mg | ORAL_TABLET | Freq: Every day | ORAL | 6 refills | Status: AC

## 2019-07-27 NOTE — Telephone Encounter (Signed)
Scheduled appt per 3/29 los. Left voicemail with new appt details. Mailed reminder letter and calendar.  

## 2019-07-30 DIAGNOSIS — L821 Other seborrheic keratosis: Secondary | ICD-10-CM | POA: Diagnosis not present

## 2019-07-30 DIAGNOSIS — L309 Dermatitis, unspecified: Secondary | ICD-10-CM | POA: Diagnosis not present

## 2019-08-03 ENCOUNTER — Telehealth: Payer: Self-pay | Admitting: *Deleted

## 2019-08-03 NOTE — Telephone Encounter (Signed)
This RN spoke with pt per her call stating she is unable to afford the exemestane presently costing almost $300 a bottle.  She states it is because she has not met her out of pocket deductible.  This RN informed pt above would be forwarded to personnel in this office who may have resources for assistance.  Of note she also stated she has tried to contact [place] but has not been able to " get thru to anyone " - she states she forgot to let them know she needed assistance with transportation.  This RN informed pt request will be sent by this RN for appointment on 08/06/2019.  This RN sent an email to [employer] per transportation request.

## 2019-08-04 ENCOUNTER — Telehealth: Payer: Self-pay | Admitting: Internal Medicine

## 2019-08-04 ENCOUNTER — Encounter: Payer: Self-pay | Admitting: Licensed Clinical Social Worker

## 2019-08-04 ENCOUNTER — Encounter: Payer: Self-pay | Admitting: *Deleted

## 2019-08-04 ENCOUNTER — Encounter: Payer: Self-pay | Admitting: Oncology

## 2019-08-04 NOTE — Telephone Encounter (Signed)
Pt called in upset about how sick she has been. She is experiencing diarrhea and she does not know what to eat or how to control it. She is wanting to see Dr. Jerilee Hoh in person. Offered virtual she explained she does not know how to use the internet. Please follow up with pt.

## 2019-08-04 NOTE — Progress Notes (Signed)
[place] Work  Clinical Social Work was referred by Therapist, sports for assessment of financial needs, especially related to cost of medication (exemestane) until deductible is met.  Clinical Social Worker contacted patient by phone  to offer support and assess for needs.  Patient feeling unwell today with diarrhea and not having slept last night so unable to talk much today. She is waiting to hear back from her primary care regarding that issue.  CSW will call back tomorrow and, if unable to speak then, will check in when patient is next in clinic (scheduled for 4/8). Options may be [facility] in [place]. No other co-pay assistance funds open at this time for her diagnosis.      Erica Young, [place], [place] Worker [facility]

## 2019-08-04 NOTE — Patient Instructions (Signed)
Food Choices to Help Relieve Diarrhea, Adult When you have diarrhea, the foods you eat and your eating habits are very important. Choosing the right foods and drinks can help:  Relieve diarrhea.  Replace lost fluids and nutrients.  Prevent dehydration. What general guidelines should I follow?  Relieving diarrhea  Choose foods with less than 2 g or .07 oz. of fiber per serving.  Limit fats to less than 8 tsp (38 g or 1.34 oz.) a day.  Avoid the following: ? Foods and beverages sweetened with high-fructose corn syrup, honey, or sugar alcohols such as xylitol, sorbitol, and mannitol. ? Foods that contain a lot of fat or sugar. ? Fried, greasy, or spicy foods. ? High-fiber grains, breads, and cereals. ? Raw fruits and vegetables.  Eat foods that are rich in probiotics. These foods include dairy products such as yogurt and fermented milk products. They help increase healthy bacteria in the stomach and intestines (gastrointestinal tract, or GI tract).  If you have lactose intolerance, avoid dairy products. These may make your diarrhea worse.  Take medicine to help stop diarrhea (antidiarrheal medicine) only as told by your health care provider. Replacing nutrients  Eat small meals or snacks every 3-4 hours.  Eat bland foods, such as white rice, toast, or baked potato, until your diarrhea starts to get better. Gradually reintroduce nutrient-rich foods as tolerated or as told by your health care provider. This includes: ? Well-cooked protein foods. ? Peeled, seeded, and soft-cooked fruits and vegetables. ? Low-fat dairy products.  Take vitamin and mineral supplements as told by your health care provider. Preventing dehydration  Start by sipping water or a special solution to prevent dehydration (oral rehydration solution, ORS). Urine that is clear or pale yellow means that you are getting enough fluid.  Try to drink at least 8-10 cups of fluid each day to help replace lost  fluids.  You may add other liquids in addition to water, such as clear juice or decaffeinated sports drinks, as tolerated or as told by your health care provider.  Avoid drinks with caffeine, such as coffee, tea, or soft drinks.  Avoid alcohol. What foods are recommended?     The items listed may not be a complete list. Talk with your health care provider about what dietary choices are best for you. Grains White rice. White, French, or pita breads (fresh or toasted), including plain rolls, buns, or bagels. White pasta. Saltine, soda, or graham crackers. Pretzels. Low-fiber cereal. Cooked cereals made with water (such as cornmeal, farina, or cream cereals). Plain muffins. Matzo. Melba toast. Zwieback. Vegetables Potatoes (without the skin). Most well-cooked and canned vegetables without skins or seeds. Tender lettuce. Fruits Apple sauce. Fruits canned in juice. Cooked apricots, cherries, grapefruit, peaches, pears, or plums. Fresh bananas and cantaloupe. Meats and other protein foods Baked or boiled chicken. Eggs. Tofu. Fish. Seafood. Smooth nut butters. Ground or well-cooked tender beef, ham, veal, lamb, pork, or poultry. Dairy Plain yogurt, kefir, and unsweetened liquid yogurt. Lactose-free milk, buttermilk, skim milk, or soy milk. Low-fat or nonfat hard cheese. Beverages Water. Low-calorie sports drinks. Fruit juices without pulp. Strained tomato and vegetable juices. Decaffeinated teas. Sugar-free beverages not sweetened with sugar alcohols. Oral rehydration solutions, if approved by your health care provider. Seasoning and other foods Bouillon, broth, or soups made from recommended foods. What foods are not recommended? The items listed may not be a complete list. Talk with your health care provider about what dietary choices are best for you. Grains Whole   grain, whole wheat, bran, or rye breads, rolls, pastas, and crackers. Wild or brown rice. Whole grain or bran cereals. Barley.  Oats and oatmeal. Corn tortillas or taco shells. Granola. Popcorn. Vegetables Raw vegetables. Fried vegetables. Cabbage, broccoli, Brussels sprouts, artichokes, baked beans, beet greens, corn, kale, legumes, peas, sweet potatoes, and yams. Potato skins. Cooked spinach and cabbage. Fruits Dried fruit, including raisins and dates. Raw fruits. Stewed or dried prunes. Canned fruits with syrup. Meat and other protein foods Fried or fatty meats. Deli meats. Chunky nut butters. Nuts and seeds. Beans and lentils. Bacon. Hot dogs. Sausage. Dairy High-fat cheeses. Whole milk, chocolate milk, and beverages made with milk, such as milk shakes. Half-and-half. Cream. sour cream. Ice cream. Beverages Caffeinated beverages (such as coffee, tea, soda, or energy drinks). Alcoholic beverages. Fruit juices with pulp. Prune juice. Soft drinks sweetened with high-fructose corn syrup or sugar alcohols. High-calorie sports drinks. Fats and oils Butter. Cream sauces. Margarine. Salad oils. Plain salad dressings. Olives. Avocados. Mayonnaise. Sweets and desserts Sweet rolls, doughnuts, and sweet breads. Sugar-free desserts sweetened with sugar alcohols such as xylitol and sorbitol. Seasoning and other foods Honey. Hot sauce. Chili powder. Gravy. Cream-based or milk-based soups. Pancakes and waffles. Summary  When you have diarrhea, the foods you eat and your eating habits are very important.  Make sure you get at least 8-10 cups of fluid each day, or enough to keep your urine clear or pale yellow.  Eat bland foods and gradually reintroduce healthy, nutrient-rich foods as tolerated, or as told by your health care provider.  Avoid high-fiber, fried, greasy, or spicy foods. This information is not intended to replace advice given to you by your health care provider. Make sure you discuss any questions you have with your health care provider. Document Revised: 08/07/2018 Document Reviewed: 04/13/2016 Elsevier Patient  Education  2020 Elsevier Inc.  

## 2019-08-04 NOTE — Progress Notes (Signed)
Rcvd staff msg from [place] (nurse) that pt needs assistance paying for Aromasin.  Unfortunately there are not foundations offering copay assistance for her Dx and the type of ins she has.  I informed Val of this information.

## 2019-08-04 NOTE — Telephone Encounter (Signed)
Can we send her info on a BRAT diet? She may also schedule visit if she prefers. Diarrhea is chronic and not likely to be a COVID symptom.Marland KitchenMarland Kitchen

## 2019-08-04 NOTE — Telephone Encounter (Signed)
Pt called back and I informed her of Jerilee Hoh message. Pt is scheduled for April 13th at 1:30pm in office   Sending for [facility]

## 2019-08-05 ENCOUNTER — Telehealth: Payer: Self-pay | Admitting: Licensed Clinical Social Worker

## 2019-08-05 NOTE — Telephone Encounter (Signed)
[place] CSW Progress Note  Clinical Education officer, museum contacted patient by phone to follow-up from yesterday. Patient reports feeling better today. She is concerned with the cost of the medicine and that she feels her insurance plan was changed without her knowledge. She wants to know if this is legal. CSW gave contact information for Legal Aid as well as [facility] program through [employer].   Patient is also interested in applying for [place] [employer] [place] in [place]. CSW will provide applications at her visit tomorrow.    Edwinna Areola Stoisits , LCSW

## 2019-08-06 ENCOUNTER — Encounter: Payer: Self-pay | Admitting: Radiation Oncology

## 2019-08-06 ENCOUNTER — Inpatient Hospital Stay: Payer: Medicare Other | Attending: Adult Health | Admitting: Licensed Clinical Social Worker

## 2019-08-06 ENCOUNTER — Encounter: Payer: Self-pay | Admitting: Licensed Clinical Social Worker

## 2019-08-06 ENCOUNTER — Ambulatory Visit
Admission: RE | Admit: 2019-08-06 | Discharge: 2019-08-06 | Disposition: A | Discharge status: Home / Self Care | Payer: Medicare Other | Source: Ambulatory Visit | Attending: Radiation Oncology | Admitting: Radiation Oncology

## 2019-08-06 ENCOUNTER — Other Ambulatory Visit: Payer: Self-pay

## 2019-08-06 VITALS — BP 118/68 | HR 75 | Temp 99.1°F | Resp 18 | Ht 61.0 in | Wt 117.6 lb

## 2019-08-06 DIAGNOSIS — I700 Atherosclerosis of aorta: Secondary | ICD-10-CM | POA: Diagnosis not present

## 2019-08-06 DIAGNOSIS — C787 Secondary malignant neoplasm of liver and intrahepatic bile duct: Secondary | ICD-10-CM | POA: Diagnosis not present

## 2019-08-06 DIAGNOSIS — G893 Neoplasm related pain (acute) (chronic): Secondary | ICD-10-CM | POA: Diagnosis not present

## 2019-08-06 DIAGNOSIS — Z853 Personal history of malignant neoplasm of breast: Secondary | ICD-10-CM | POA: Diagnosis not present

## 2019-08-06 DIAGNOSIS — C7951 Secondary malignant neoplasm of bone: Secondary | ICD-10-CM | POA: Insufficient documentation

## 2019-08-06 DIAGNOSIS — C7971 Secondary malignant neoplasm of right adrenal gland: Secondary | ICD-10-CM | POA: Insufficient documentation

## 2019-08-06 DIAGNOSIS — C50911 Malignant neoplasm of unspecified site of right female breast: Secondary | ICD-10-CM | POA: Insufficient documentation

## 2019-08-06 DIAGNOSIS — Z79899 Other long term (current) drug therapy: Secondary | ICD-10-CM | POA: Insufficient documentation

## 2019-08-06 DIAGNOSIS — Z7982 Long term (current) use of aspirin: Secondary | ICD-10-CM | POA: Diagnosis not present

## 2019-08-06 DIAGNOSIS — N281 Cyst of kidney, acquired: Secondary | ICD-10-CM | POA: Insufficient documentation

## 2019-08-06 DIAGNOSIS — Z170 Estrogen receptor positive status [ER+]: Secondary | ICD-10-CM | POA: Insufficient documentation

## 2019-08-06 NOTE — Progress Notes (Signed)
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  I have placed the referral to radiation oncology and she is agreeable.  If she wishes to reconsider the possibility of radiation to the sinus mass, I would encourage that since it also may well control some of the symptoms she is experiencing in the evening with breathing.  Past/Anticipated chemotherapy by medical oncology, if any: Per Dr. Jana Hakim 07/27/19: I suggested she start exemestane to control the liver lesion.  She initially said yes but later told by nurse that she is thinking that she does not want that treatment.  In any case I have placed the prescription in for her after discussing the possible toxicities side effects and complications.  I have encouraged her to make sure to keep her son abreast of her situation.  I have let our social worker know that she is going to need help with transportation.  She will see me again in about 8 weeks.  She knows to call us earlier as needed.  Patient's main complaints related to symptomatic tumor(s) are: She has significant pain in her lower back and it runs down her right leg primarily.  This is worse when she lies down and better when she stands  Pain on a scale of 0-10 is: Pt reports "gassy" pain in abdomen, rated 5/10. Pt reports cramping, tight pain in hip, lower back, and right leg, rated 10/10. Pt states that last night the pain in right leg was unbearable and could not sleep.   If Spine Met(s), symptoms, if any, include:  Bowel/Bladder retention or incontinence (please describe): denies  Numbness or weakness in extremities (please describe): denies  Current Decadron regimen, if applicable: N/A  Ambulatory status? Walker? Wheelchair?: ambulatory without assistive device  SAFETY  ISSUES: Prior radiation? Radiation treatment dates:8/08/2016through 12/17/2014   Site/dose:The patient was treated to 2 separate target regions concurrently consisting o fa metastasis within the ethmoid sinus as well as a metastasis within the right skull. Both of these areas were treated using a IMRT technique to a dose of 30 gray in 10 fractions.  PREVIOUS RADIATION THERAPY: Yes, 2007 in [place] for breast cancer  Pacemaker/ICD? No  Possible current pregnancy? No  Is the patient on methotrexate? No  Additional Complaints / other details:  Pt presents today for f/u new with Dr. Sondra Come for Radiation Oncology. Pt is unaccompanied.  BP 118/68 (BP Location: Right Arm, Patient Position: Sitting, Cuff Size: Normal)   Pulse 75   Temp 99.1 F (37.3 C)   Resp 18   Ht '5\' 1"'  (1.549 m)   Wt 117 lb 9.6 oz (53.3 kg)   SpO2 98%   BMI 22.22 kg/m   Wt Readings from Last 3 Encounters:  08/06/19 117 lb 9.6 oz (53.3 kg)  07/27/19 121 lb 14.4 oz (55.3 kg)  07/06/19 123 lb (55.8 kg)   Loma Sousa, RN BSN

## 2019-08-06 NOTE — Progress Notes (Signed)
[facility] CSW Progress Note  Clinical Social Worker met with patient to begin foundation assistance applications. CSW and patient completed [employer] application and CSW faxed it today. CSW provided patient with application for [employer] to complete at home and gather supporting documentation. Instructed patient to contact this CSW when she completes the application, if she chooses to do so.     Michelle E Stoisits LCSW, LCSW  

## 2019-08-06 NOTE — Patient Instructions (Signed)
Coronavirus (COVID-19) Are you at risk?  Are you at risk for the Coronavirus (COVID-19)?  To be considered HIGH RISK for Coronavirus (COVID-19), you have to meet the following criteria:  . Traveled to [place], [place], [place], [place] or [place]; or in the [place] to [place], [place], [place], or [place]; and have fever, cough, and shortness of breath within the last 2 weeks of travel OR . Been in close contact with a person diagnosed with COVID-19 within the last 2 weeks and have fever, cough, and shortness of breath . IF YOU DO NOT MEET THESE CRITERIA, YOU ARE CONSIDERED LOW RISK FOR COVID-19.  What to do if you are HIGH RISK for COVID-19?  . If you are having a medical emergency, call 911. . Seek medical care right away. Before you go to a doctor's office, urgent care or emergency department, call ahead and tell them about your recent travel, contact with someone diagnosed with COVID-19, and your symptoms. You should receive instructions from your physician's office regarding next steps of care.  . When you arrive at healthcare provider, tell the healthcare staff immediately you have returned from visiting [place], [place], [place], [place] or [place]; or traveled in the [place] to [place], [place], [place], or [place]; in the last two weeks or you have been in close contact with a person diagnosed with COVID-19 in the last 2 weeks.   . Tell the health care staff about your symptoms: fever, cough and shortness of breath. . After you have been seen by a medical provider, you will be either: o Tested for (COVID-19) and discharged home on quarantine except to seek medical care if symptoms worsen, and asked to  - Stay home and avoid contact with others until you get your results (4-5 days)  - Avoid travel on public transportation if possible (such as bus, train, or airplane) or o Sent to the Emergency Department by EMS for evaluation, COVID-19 testing, and possible  admission depending on your condition and test results.  What to do if you are LOW RISK for COVID-19?  Reduce your risk of any infection by using the same precautions used for avoiding the common cold or flu:  . Wash your hands often with soap and warm water for at least 20 seconds.  If soap and water are not readily available, use an alcohol-based hand sanitizer with at least 60% alcohol.  . If coughing or sneezing, cover your mouth and nose by coughing or sneezing into the elbow areas of your shirt or coat, into a tissue or into your sleeve (not your hands). . Avoid shaking hands with others and consider head nods or verbal greetings only. . Avoid touching your eyes, nose, or mouth with unwashed hands.  . Avoid close contact with people who are sick. . Avoid places or events with large numbers of people in one location, like concerts or sporting events. . Carefully consider travel plans you have or are making. . If you are planning any travel outside or inside the [place], visit the CDC's Travelers' Health webpage for the latest health notices. . If you have some symptoms but not all symptoms, continue to monitor at home and seek medical attention if your symptoms worsen. . If you are having a medical emergency, call 911.   ADDITIONAL HEALTHCARE OPTIONS FOR PATIENTS  [facility] / e-Visit: [email or web address]         [facility]: [phone]  [facility]: [phone]                   [facility]: [phone]   

## 2019-08-06 NOTE — Progress Notes (Signed)
Radiation Oncology         [phone]) [phone] ________________________________  Name: Erica Young MRN: [id number]  Date: 08/06/2019  DOB: 12-27-1940  Re-Evaluation Note  CC: Isaac Bliss, Rayford Halsted, MD  Magrinat, Virgie Dad, MD    ICD-10-CM   1. Bone metastases (HCC)  C79.51     Diagnosis:   Metastatic stage IV right breast cancer.  Narrative:  The patient returns today to discuss radiation treatment options. She was most recently seen in consultation on 05/20/2019 for consideration of radiation therapy to the sinus cavity mass, which she has declined for the time being. She has been referred back to me today to discuss palliative radiation therapy to her lumbar spine.   She presented to her gastroenterologist with generalized abdominal pain. She underwent CT abdomen/pelvis on 07/14/2019, which showed: interval progression of bone metastasis involving L1 and L5 with posterior extension of tumor into ventral canal; interval increase in size of heterogeneous lesion within dome of liver, worrisome for metastatic disease; stable right adrenal gland metastasis; stable subpleural nodule within lingula.  On review of systems, the patient reports significant pain her her right hip, lower back, and right leg, which she describes as cramping and tight and rates at 10/10. The pain is interfering with her sleep. She also reports abdominal pain, which she describes as "gassy" and rates at 5/10. She denies any other symptoms.    Allergies:  is allergic to ciprofloxacin.  Meds: Current Outpatient Medications  Medication Sig Dispense Refill  . ALPRAZolam (XANAX) 0.25 MG tablet TAKE 1 TABLET BY MOUTH THREE TIMES DAILY AS NEEDED FOR ANXIETY 90 tablet 0  . amoxicillin (AMOXIL) 500 MG capsule     . aspirin EC 81 MG tablet Take 81 mg by mouth daily after breakfast.     . carvedilol (COREG) 12.5 MG tablet Take 1 tablet (12.5 mg total) by mouth 2 (two) times daily with a meal. (Patient taking differently: Take  6.25 mg by mouth 2 (two) times daily with a meal. Take ONE HALF tablet twice daily.) 180 tablet 1  . Cholecalciferol (VITAMIN D PO) Take 1,000 Units by mouth 2 (two) times daily.    Marland Kitchen docusate sodium (COLACE) 100 MG capsule Take 100 mg by mouth daily.    Marland Kitchen exemestane (AROMASIN) 25 MG tablet Take 1 tablet (25 mg total) by mouth daily after breakfast. 60 tablet 6  . Glucosamine 500 MG CAPS Take 500 mg by mouth daily.     . hydrocortisone 2.5 % cream     . loratadine (CLARITIN) 10 MG tablet Take 1 tablet (10 mg total) by mouth daily. 30 tablet 11  . losartan (COZAAR) 25 MG tablet Take 0.5 tablets (12.5 mg total) by mouth daily. 30 tablet 2  . meclizine (ANTIVERT) 25 MG tablet Take 1 tablet (25 mg total) by mouth 3 (three) times daily as needed for dizziness. 15 tablet 0  . ondansetron (ZOFRAN ODT) 4 MG disintegrating tablet Take 1 tablet (4 mg total) by mouth every 8 (eight) hours as needed for nausea or vomiting. 20 tablet 0  . spironolactone (ALDACTONE) 25 MG tablet Take 0.5 tablets (12.5 mg total) by mouth daily. 30 tablet 2   No current facility-administered medications for this encounter.    Physical Findings: The patient is in no acute distress. Patient is alert and oriented.  height is 5\' 1"  (1.549 m) and weight is 117 lb 9.6 oz (53.3 kg). Her temperature is 99.1 F (37.3 C). Her blood pressure is 118/68 and  her pulse is 75. Her respiration is 18 and oxygen saturation is 98%.  No significant changes. Lungs are clear to auscultation bilaterally. Heart has regular rate and rhythm. No palpable cervical, supraclavicular, or axillary adenopathy. Abdomen soft, non-tender, normal bowel sounds.  Patient walks with a limp.  She appears to have some weakness in the proximal muscle groups of the right lower extremity, 3/5  Lab Findings: Lab Results  Component Value Date   WBC 5.8 06/28/2019   HGB 14.0 06/28/2019   HCT 42.2 06/28/2019   MCV 96.8 06/28/2019   PLT 220 06/28/2019    Radiographic  Findings: CT Abdomen Pelvis W Contrast  Result Date: 07/15/2019 CLINICAL DATA:  generalized abdominal pain. History of breast cancer. EXAM: CT ABDOMEN AND PELVIS WITH CONTRAST TECHNIQUE: Multidetector CT imaging of the abdomen and pelvis was performed using the standard protocol following bolus administration of intravenous contrast. CONTRAST:  128mL OMNIPAQUE IOHEXOL 300 MG/ML  SOLN COMPARISON:  CT AP 11/19/2016 in CT chest 02/02/19 FINDINGS: Lower chest: No acute abnormality identified. Subpleural nodule within the lingula measures 1 cm and is unchanged from previous exam, image number 5/6. Hepatobiliary: Heterogeneous low-density lesion in the posteromedial dome of liver measures 3.1 cm. On the previous exam from 02/02/2019 this measured 0.8 cm. Cholecystectomy. No intrahepatic bile duct dilatation. Mild increase caliber of the CBD which measures 0.7 cm. Pancreas: Unremarkable. No pancreatic ductal dilatation or surrounding inflammatory changes. Spleen: Normal in size without focal abnormality. Adrenals/Urinary Tract: Normal left adrenal gland. Right adrenal metastasis measures 2.8 x 1.8 cm, image 21/2. Stable from previous exam. Several small right kidney cysts. No mass or hydronephrosis identified. Urinary bladder is unremarkable. Stomach/Bowel: Stomach is within normal limits. Appendix appears normal. No evidence of bowel wall thickening, distention, or inflammatory changes. Sigmoid diverticulosis. Vascular/Lymphatic: Aortic atherosclerosis. No adenopathy. Reproductive: Uterus and bilateral adnexa are unremarkable. Other: No free fluid or fluid collections. Musculoskeletal: Mixed lytic and sclerotic bone metastases are identified involving L1 and L5. Tumor involving the L1 vertebra extends into the right posterior elements as well as the ventral aspect of the canal, image 31/2. Similarly, at L5 there is posterior extension of tumor into the ventral canal, image 49/5. Compared with 11/19/2016 tumor  involvement at these levels has progressed. IMPRESSION: 1. Interval progression of bone metastasis involving L1 and L5 with posterior extension of tumor into the ventral canal at these levels. 2. Interval increase in size of heterogeneous lesion within the dome of liver worrisome for metastatic disease. 3. Stable right adrenal gland metastasis. 4. Stable subpleural nodule within the lingula measuring 1 cm. Aortic Atherosclerosis (ICD10-I70.0). Electronically Signed   By: Kerby Moors M.D.   On: 07/15/2019 09:27    Impression:  Metastatic stage IV breast cancer  Patient is symptomatic from her osseous metastasis in the lumbar spine area.  She appears to have radicular pain radiating into her right leg and pelvis region.  She also reports some difficulties with bowel movements. sHe denies any fecal or urinary incontinence.  She does not wish to consider treatment to the sinus area at this time.  Plan:  Patient is scheduled for CT simulation on April 12 at 3 PM.  We discussed initiation of treatment April 13 or 14 but the patient would like to wait to start her radiation therapy after her second Covid vaccine injection on April 15.  We also discussed consideration for steroids which may help through pain in the short-term she does not wish to consider this medication at this time.  35 minutes of total time was spent for this patient encounter, including preparation, face-to-face counseling with the patient and coordination of care, physical exam, and documentation of the encounter.  -----------------------------------  Blair Promise, PhD, MD   This document serves as a record of services personally performed by Gery Pray, MD. It was created on his behalf by Wilburn Mylar, a trained medical scribe. The creation of this record is based on the scribe's personal observations and the provider's statements to them. This document has been checked and approved by the attending provider.

## 2019-08-10 ENCOUNTER — Other Ambulatory Visit: Payer: Self-pay

## 2019-08-10 ENCOUNTER — Ambulatory Visit
Admission: RE | Admit: 2019-08-10 | Discharge: 2019-08-10 | Disposition: A | Discharge status: Home / Self Care | Payer: Medicare Other | Source: Ambulatory Visit | Attending: Radiation Oncology | Admitting: Radiation Oncology

## 2019-08-10 DIAGNOSIS — Z510 Encounter for antineoplastic radiation therapy: Secondary | ICD-10-CM | POA: Insufficient documentation

## 2019-08-10 DIAGNOSIS — C7951 Secondary malignant neoplasm of bone: Secondary | ICD-10-CM | POA: Diagnosis not present

## 2019-08-10 DIAGNOSIS — C50411 Malignant neoplasm of upper-outer quadrant of right female breast: Secondary | ICD-10-CM | POA: Insufficient documentation

## 2019-08-10 NOTE — Progress Notes (Signed)
  Radiation Oncology         [phone]) [phone] ________________________________  Name: Erica Young MRN: [id number]  Date: 08/10/2019  DOB: Sep 30, 1940  SIMULATION AND TREATMENT PLANNING NOTE    ICD-10-CM   1. Bone metastases (HCC)  C79.51     DIAGNOSIS: Metastatic stage IV right breast cancer  NARRATIVE:  The patient was brought to the [facility].  Identity was confirmed.  All relevant records and images related to the planned course of therapy were reviewed.  The patient freely provided informed written consent to proceed with treatment after reviewing the details related to the planned course of therapy. The consent form was witnessed and verified by the simulation staff.  Then, the patient was set-up in a stable reproducible  supine position for radiation therapy.  CT images were obtained.  Surface markings were placed.  The CT images were loaded into the planning software.  Then the target and avoidance structures were contoured.  Treatment planning then occurred.  The radiation prescription was entered and confirmed.  Then, I designed and supervised the construction of a total of 5 medically necessary complex treatment devices.  I have requested : 3D Simulation  I have requested a DVH of the following structures: GTV.  Spinal cord and kidneys.  I have ordered:dose calc.  PLAN:  The patient will receive 35 Gy in 14 fractions directed at the lumbar spine.  -----------------------------------  Blair Promise, PhD, MD  This document serves as a record of services personally performed by Gery Pray, MD. It was created on his behalf by Clerance Lav, a trained medical scribe. The creation of this record is based on the scribe's personal observations and the provider's statements to them. This document has been checked and approved by the attending provider.

## 2019-08-11 ENCOUNTER — Ambulatory Visit (INDEPENDENT_AMBULATORY_CARE_PROVIDER_SITE_OTHER): Payer: Medicare Other | Admitting: Internal Medicine

## 2019-08-11 ENCOUNTER — Encounter: Payer: Self-pay | Admitting: Internal Medicine

## 2019-08-11 VITALS — BP 110/70 | HR 85 | Temp 97.9°F | Wt 117.7 lb

## 2019-08-11 DIAGNOSIS — C787 Secondary malignant neoplasm of liver and intrahepatic bile duct: Secondary | ICD-10-CM

## 2019-08-11 DIAGNOSIS — K5900 Constipation, unspecified: Secondary | ICD-10-CM | POA: Diagnosis not present

## 2019-08-11 DIAGNOSIS — D649 Anemia, unspecified: Secondary | ICD-10-CM | POA: Diagnosis not present

## 2019-08-11 DIAGNOSIS — R4589 Other symptoms and signs involving emotional state: Secondary | ICD-10-CM | POA: Diagnosis not present

## 2019-08-11 DIAGNOSIS — C50411 Malignant neoplasm of upper-outer quadrant of right female breast: Secondary | ICD-10-CM | POA: Diagnosis not present

## 2019-08-11 DIAGNOSIS — Z170 Estrogen receptor positive status [ER+]: Secondary | ICD-10-CM | POA: Diagnosis not present

## 2019-08-11 DIAGNOSIS — C7951 Secondary malignant neoplasm of bone: Secondary | ICD-10-CM | POA: Diagnosis not present

## 2019-08-11 DIAGNOSIS — F419 Anxiety disorder, unspecified: Secondary | ICD-10-CM

## 2019-08-11 LAB — CBC WITH DIFFERENTIAL/PLATELET
Basophils Absolute: 0 10*3/uL (ref 0.0–0.1)
Basophils Relative: 0.8 % (ref 0.0–3.0)
Eosinophils Absolute: 0.1 10*3/uL (ref 0.0–0.7)
Eosinophils Relative: 1.7 % (ref 0.0–5.0)
HCT: 37.9 % (ref 36.0–46.0)
Hemoglobin: 12.8 g/dL (ref 12.0–15.0)
Lymphocytes Relative: 21.6 % (ref 12.0–46.0)
Lymphs Abs: 1.3 10*3/uL (ref 0.7–4.0)
MCHC: 33.8 g/dL (ref 30.0–36.0)
MCV: 94.6 fl (ref 78.0–100.0)
Monocytes Absolute: 0.4 10*3/uL (ref 0.1–1.0)
Monocytes Relative: 6.5 % (ref 3.0–12.0)
Neutro Abs: 4.2 10*3/uL (ref 1.4–7.7)
Neutrophils Relative %: 69.4 % (ref 43.0–77.0)
Platelets: 285 10*3/uL (ref 150.0–400.0)
RBC: 4.01 Mil/uL (ref 3.87–5.11)
RDW: 14.4 % (ref 11.5–15.5)
WBC: 6 10*3/uL (ref 4.0–10.5)

## 2019-08-11 NOTE — Patient Instructions (Signed)
-Nice seeing you today!!  -For constipation: drink plenty of fluids, miralax daily (can drop to every other day if bowel movements become too frequent. Would also benefit from a fiber supplement daily like metamucil.  -Consider starting treatment for depression. I think you would benefit from this.   Chronic Constipation  Chronic constipation is a condition in which a person has three or fewer bowel movements a week, for three months or longer. This condition is especially common in older adults. The two main kinds of chronic constipation are secondary constipation and functional constipation. Secondary constipation results from another condition or a treatment. Functional constipation, also called primary or idiopathic constipation, is divided into three types:  Normal transit constipation. In this type, movement of stool through the colon (stool transit) occurs normally.  Slow transit constipation. In this type, stool moves slowly through the colon.  Outlet constipation or pelvic floor dysfunction. In this type, the nerves and muscles that empty the rectum do not work normally. What are the causes? Causes of secondary constipation may include:  Failing to drink enough fluid, eat enough food or fiber, or get physically active.  Pregnancy.  A tear in the anus (anal fissure).  Blockage in the bowel (bowel obstruction).  Narrowing of the bowel (bowel stricture).  Having a long-term medical condition, such as: ? Diabetes. ? Hypothyroidism. ? Multiple sclerosis. ? Parkinson disease. ? Stroke. ? Spinal cord injury. ? Dementia. ? Colon cancer. ? Inflammatory bowel disease (IBD). ? Iron-deficiency anemia. ? Outward collapse of the rectum (rectal prolapse). ? Hemorrhoids.  Taking certain medicines, including: ? Narcotics. These are a certain type of prescription pain medicine. ? Antacids. ? Iron supplements. ? Water pills (diuretics). ? Certain blood pressure  medicines. ? Anti-seizure medicines. ? Antidepressants. ? Medicines for Parkinson disease. The cause of functional constipation is not known, but some conditions are associated with it. These conditions include:  Stress.  Problems in the nerves and muscles that control stool transit.  Weak or impaired pelvic floor muscles. What increases the risk? You may be at higher risk for chronic constipation if you:  Are older than age 44.  Are female.  Live in a long-term care facility.  Do not get much exercise or physical activity (have a sedentary lifestyle).  Do not drink enough fluids.  Do not eat enough food, especially fiber.  Have a long-term disease.  Have a mental health disorder or eating disorder.  Take many medicines. What are the signs or symptoms? The main symptom of chronic constipation is having three or fewer bowel movements a week for several weeks. Other signs and symptoms may vary from person to person. These include:  Pushing hard (straining) to pass stool.  Painful bowel movements.  Having hard or lumpy stools.  Having lower belly discomfort, such as cramps or bloating.  Being unable to have a bowel movement when you feel the urge.  Feeling like you still need to pass stool after a bowel movement.  Feeling that you have something in your rectum that is blocking or preventing bowel movements.  Seeing blood on the toilet paper or in your stool.  Worsening confusion (in older adults). How is this diagnosed? This condition may be diagnosed based on:  Symptoms and medical history. You will be asked about your symptoms, lifestyle, diet, and any medicines that you are taking.  Physical exam. ? Your belly (abdomen) will be examined. ? A digital rectal exam may be done. For this exam, a health care provider  places a lubricated, gloved finger into the rectum.  Other tests to check for any underlying causes of your constipation. These may be ordered if  you have bleeding in your rectum, weight loss, or a family history of colon cancer. In these cases, you may have: ? Imaging studies of the colon. These may include X-ray, ultrasound, or CT scan. ? Blood tests. ? A procedure to examine the inside of your colon (colonoscopy). ? More specialized tests to check:  Whether your anal sphincter works well. This is a ring-shaped muscle that controls the closing of the anus.  How well food moves through your colon. ? Tests to measure the nerve signal in your pelvic floor muscles (electromyography). How is this treated? Treatment for chronic constipation depends on the cause. Most often, treatment starts with:  Being more active and getting regular exercise.  Drinking more fluids.  Adding fiber to your diet. Sources of fiber include fruits, vegetables, whole grains, and fiber supplements.  Using medicines such as stool softeners or medicines that increase contractions in your digestive system (pro-motility agents).  Training your pelvic muscles with biofeedback.  Surgery, if there is obstruction. Treatment for secondary chronic constipation depends on the underlying condition. You may need to:  Stop or change some medicines if they cause constipation.  Use a fiber supplement (bulk laxative) or stool softener.  Use prescription laxative. This works by [employer] into your colon (osmotic laxative). You may also need to see a specialist who treats conditions of the digestive system (gastroenterologist). Follow these instructions at home:   Take over-the-counter and prescription medicines only as told by your health care provider.  If you are taking a laxative, take it as told by your health care provider.  Eat a balanced diet that includes enough fiber. Ask your health care provider to recommend a diet that is right for you.  Drink clear fluids, especially water. Avoid drinking alcohol, caffeine, and soda.  Drink enough fluid to  keep your urine pale yellow.  Get some physical activity every day. Ask your health care provider what physical activities are safe for you.  Get colon cancer screenings as told by your health care provider.  Keep all follow-up visits as told by your health care provider. This is important. Contact a health care provider if:  You are having three or fewer bowel movements a week.  Your stools are hard or lumpy.  You notice blood on the toilet paper or in your stool after you have a bowel movement.  You have unexplained weight loss.  You have rectum (rectal) pain.  You have stool leakage.  You experience nausea or vomiting. Get help right away if:  You have rectal bleeding or you pass blood clots.  You have severe rectal pain.  You have body tissue that pushes out (protrudes) from your anus.  You have severe pain or bloating (distension) in your abdomen.  You have vomiting that you cannot control. Summary  Chronic constipation is a condition in which a person has three or fewer bowel movements a week, for three months or longer.  You may have a higher risk for this condition if you are an older adult, or if you do not drink enough water or get enough physical activity (are sedentary).  Treatment for this condition depends on the cause. Most treatments for chronic constipation include adding fiber to your diet, drinking more fluids, and getting more physical activity. You may also need to treat any underlying medical conditions  or stop or change certain medicines if they cause constipation.  If lifestyle changes do not relieve constipation, your health care provider may recommend taking a laxative. This information is not intended to replace advice given to you by your health care provider. Make sure you discuss any questions you have with your health care provider. Document Revised: 03/29/2017 Document Reviewed: 01/01/2017 Elsevier Patient Education  [place].

## 2019-08-11 NOTE — Progress Notes (Signed)
Established Patient Office Visit     This visit occurred during the SARS-CoV-2 public health emergency.  Safety protocols were in place, including screening questions prior to the visit, additional usage of staff PPE, and extensive cleaning of exam room while observing appropriate contact time as indicated for disinfecting solutions.    CC/Reason for Visit: Discuss constipation and some other acute concerns  HPI: Erica Young is a 79 y.o. female who is coming in today for the above mentioned reasons.  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  She feels very depressed since her husband passed away, she really has no support system.  She has lost 5 pounds.  Feels like her memory is suffering.  [facility] has been involved in her care.  She has been very constipated and wonders what she can do about this.   Past Medical/Surgical History: Past Medical History:  Diagnosis Date  . Allergy   . Anxiety   . 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 Cancer [facility]) 2007   right breat ca  , lumpectomy and radiation tx (declined chemo and additional prophylactic meds)  . Cataract    both eyes  10/2016 Lt.     04/2017 Rt.   . CEREBROVASCULAR DISEASE 03/03/2010  . Diverticulitis   . DIVERTICULOSIS, COLON 03/03/2010  . Fatty liver 08/02/09   as per U/S done by [facility]  . Foramen ovale    positive bubble study  . GERD (gastroesophageal reflux disease)   . Headache(784.0)    she thinks sinus headaches  . History of cerebral artery stenosis    right middle  . HYPERLIPIDEMIA 03/03/2010  . Hypertension   . HYPOTHYROIDISM 03/03/2010   no longer on meds  . Internal hemorrhoid   . Low back pain   . Mass of sinus   . Mastoiditis    noted on brain MRI  . Rib fracture   .  Right leg pain   . Stroke [facility]) Oct. 2011   TIA  . Ulcer     Past Surgical History:  Procedure Laterality Date  . BREAST LUMPECTOMY     right  . CATARACT EXTRACTION Left    10/2016   Rt 04/2017  . CHOLECYSTECTOMY    . COLONOSCOPY    . POLYPECTOMY     benign cecum  . SHOULDER SURGERY     right shoulder (dislocation)  . TONSILLECTOMY    . TUBAL LIGATION    . UPPER GASTROINTESTINAL ENDOSCOPY    . VIDEO BRONCHOSCOPY WITH ENDOBRONCHIAL ULTRASOUND N/A 01/07/2014   Procedure: VIDEO BRONCHOSCOPY WITH ENDOBRONCHIAL ULTRASOUND;  Surgeon: Ivin Poot, MD;  Location: [place];  Service: Thoracic;  Laterality: N/A;    Social History:  reports that she quit smoking about 28 years ago. Her smoking use included cigarettes. She started smoking about 48 years ago. She has a 10.00 pack-year smoking history. She has never used smokeless tobacco. She reports that she does not drink alcohol or use drugs.  Allergies: Allergies  Allergen Reactions  . Ciprofloxacin Anaphylaxis    Family History:  Family History  Problem Relation Age of Onset  . Heart disease Sister        cerebral vascular disease also  . Heart disease Brother  cerebral vascular disease also  . Heart disease Brother        cerebral vascular disease  . Heart disease Sister        cebreal vascular disease also  . Heart disease Sister        cebreal vascular diease also  . Heart disease Sister        cerebral vascular diease also  . Stomach cancer Father   . Colon cancer Neg Hx   . Esophageal cancer Neg Hx   . Rectal cancer Neg Hx   . Colon polyps Neg Hx   . Asthma Neg Hx      Current Outpatient Medications:  .  ALPRAZolam (XANAX) 0.25 MG tablet, TAKE 1 TABLET BY MOUTH THREE TIMES DAILY AS NEEDED FOR ANXIETY, Disp: 90 tablet, Rfl: 0 .  aspirin EC 81 MG tablet, Take 81 mg by mouth daily after breakfast. , Disp: , Rfl:  .  carvedilol (COREG) 12.5 MG tablet, Take 1 tablet (12.5 mg total) by mouth 2 (two) times daily  with a meal. (Patient taking differently: Take 6.25 mg by mouth 2 (two) times daily with a meal. Take ONE HALF tablet twice daily.), Disp: 180 tablet, Rfl: 1 .  Cholecalciferol (VITAMIN D PO), Take 1,000 Units by mouth 2 (two) times daily., Disp: , Rfl:  .  docusate sodium (COLACE) 100 MG capsule, Take 100 mg by mouth daily., Disp: , Rfl:  .  exemestane (AROMASIN) 25 MG tablet, Take 1 tablet (25 mg total) by mouth daily after breakfast., Disp: 60 tablet, Rfl: 6 .  Glucosamine 500 MG CAPS, Take 500 mg by mouth daily. , Disp: , Rfl:  .  hydrocortisone 2.5 % cream, , Disp: , Rfl:  .  loratadine (CLARITIN) 10 MG tablet, Take 1 tablet (10 mg total) by mouth daily., Disp: 30 tablet, Rfl: 11 .  losartan (COZAAR) 25 MG tablet, Take 0.5 tablets (12.5 mg total) by mouth daily., Disp: 30 tablet, Rfl: 2 .  meclizine (ANTIVERT) 25 MG tablet, Take 1 tablet (25 mg total) by mouth 3 (three) times daily as needed for dizziness., Disp: 15 tablet, Rfl: 0 .  ondansetron (ZOFRAN ODT) 4 MG disintegrating tablet, Take 1 tablet (4 mg total) by mouth every 8 (eight) hours as needed for nausea or vomiting., Disp: 20 tablet, Rfl: 0 .  spironolactone (ALDACTONE) 25 MG tablet, Take 0.5 tablets (12.5 mg total) by mouth daily., Disp: 30 tablet, Rfl: 2  Review of Systems:  Constitutional: Denies fever, chills, diaphoresis, appetite change and fatigue.  HEENT: Denies photophobia, eye pain, redness, hearing loss, ear pain, congestion, sore throat, rhinorrhea, sneezing, mouth sores, trouble swallowing, neck pain, neck stiffness and tinnitus.   Respiratory: Denies SOB, DOE, cough, chest tightness,  and wheezing.   Cardiovascular: Denies chest pain, palpitations and leg swelling.  Gastrointestinal: Denies nausea, vomiting, abdominal pain, diarrhea,  blood in stool and abdominal distention.  Genitourinary: Denies dysuria, urgency, frequency, hematuria, flank pain and difficulty urinating.  Endocrine: Denies: hot or cold intolerance,  sweats, changes in hair or nails, polyuria, polydipsia. Musculoskeletal: Denies myalgias, back pain, joint swelling, arthralgias and gait problem.  Skin: Denies pallor, rash and wound.  Neurological: Denies dizziness, seizures, syncope, weakness, light-headedness, numbness and headaches.  Hematological: Denies adenopathy. Easy bruising, personal or family bleeding history  Psychiatric/Behavioral: Denies suicidal ideation,  confusion, nervousness, sleep disturbance and agitation    Physical Exam: Vitals:   08/11/19 1324  BP: 110/70  Pulse: 85  Temp: 97.9 F (36.6 C)  TempSrc:  Temporal  SpO2: 97%  Weight: 117 lb 11.2 oz (53.4 kg)    Body mass index is 22.24 kg/m.   Constitutional: NAD, calm, comfortable Eyes: PERRL, lids and conjunctivae normal ENMT: Mucous membranes are moist. Neurologic: CN 2-12 grossly intact. Sensation intact, DTR normal. Strength 5/5 in all 4.  Psychiatric: Normal judgment and insight. Alert and oriented x 3.  Mood is depressed  Impression and Plan:  Depressed mood    Office Visit from 08/11/2019 in [place] at [place]  PHQ-9 Total Score  12     -I think she would benefit from starting an antidepressant, she is very resistant as usual to starting new medications and would like to think about this beforehand. -I think that depression is probably affecting her memory.  She has been encouraged to get more involved at church to create a support system. She states she really has no local friends, it was just her and her husband who died of metastatic melanoma in November.  Liver metastasis ([place]) Bone metastases ([place]) Malignant neoplasm of upper-outer quadrant of right breast in female, estrogen receptor positive ([place]) -Will be undergoing lumbar spine radiation starting next week.  Anxiety -Continues to use benzodiazepines as needed  Constipation, unspecified constipation type -Advised to increase water intake, start daily fiber supplement,  MiraLAX daily or every other day.    Patient Instructions  -Nice seeing you today!!  -For constipation: drink plenty of fluids, miralax daily (can drop to every other day if bowel movements become too frequent. Would also benefit from a fiber supplement daily like metamucil.  -Consider starting treatment for depression. I think you would benefit from this.   Chronic Constipation  Chronic constipation is a condition in which a person has three or fewer bowel movements a week, for three months or longer. This condition is especially common in older adults. The two main kinds of chronic constipation are secondary constipation and functional constipation. Secondary constipation results from another condition or a treatment. Functional constipation, also called primary or idiopathic constipation, is divided into three types:  Normal transit constipation. In this type, movement of stool through the colon (stool transit) occurs normally.  Slow transit constipation. In this type, stool moves slowly through the colon.  Outlet constipation or pelvic floor dysfunction. In this type, the nerves and muscles that empty the rectum do not work normally. What are the causes? Causes of secondary constipation may include:  Failing to drink enough fluid, eat enough food or fiber, or get physically active.  Pregnancy.  A tear in the anus (anal fissure).  Blockage in the bowel (bowel obstruction).  Narrowing of the bowel (bowel stricture).  Having a long-term medical condition, such as: ? Diabetes. ? Hypothyroidism. ? Multiple sclerosis. ? Parkinson disease. ? Stroke. ? Spinal cord injury. ? Dementia. ? Colon cancer. ? Inflammatory bowel disease (IBD). ? Iron-deficiency anemia. ? Outward collapse of the rectum (rectal prolapse). ? Hemorrhoids.  Taking certain medicines, including: ? Narcotics. These are a certain type of prescription pain medicine. ? Antacids. ? Iron  supplements. ? Water pills (diuretics). ? Certain blood pressure medicines. ? Anti-seizure medicines. ? Antidepressants. ? Medicines for Parkinson disease. The cause of functional constipation is not known, but some conditions are associated with it. These conditions include:  Stress.  Problems in the nerves and muscles that control stool transit.  Weak or impaired pelvic floor muscles. What increases the risk? You may be at higher risk for chronic constipation if you:  Are older than age  41.  Are female.  Live in a long-term care facility.  Do not get much exercise or physical activity (have a sedentary lifestyle).  Do not drink enough fluids.  Do not eat enough food, especially fiber.  Have a long-term disease.  Have a mental health disorder or eating disorder.  Take many medicines. What are the signs or symptoms? The main symptom of chronic constipation is having three or fewer bowel movements a week for several weeks. Other signs and symptoms may vary from person to person. These include:  Pushing hard (straining) to pass stool.  Painful bowel movements.  Having hard or lumpy stools.  Having lower belly discomfort, such as cramps or bloating.  Being unable to have a bowel movement when you feel the urge.  Feeling like you still need to pass stool after a bowel movement.  Feeling that you have something in your rectum that is blocking or preventing bowel movements.  Seeing blood on the toilet paper or in your stool.  Worsening confusion (in older adults). How is this diagnosed? This condition may be diagnosed based on:  Symptoms and medical history. You will be asked about your symptoms, lifestyle, diet, and any medicines that you are taking.  Physical exam. ? Your belly (abdomen) will be examined. ? A digital rectal exam may be done. For this exam, a health care provider places a lubricated, gloved finger into the rectum.  Other tests to check for any  underlying causes of your constipation. These may be ordered if you have bleeding in your rectum, weight loss, or a family history of colon cancer. In these cases, you may have: ? Imaging studies of the colon. These may include X-ray, ultrasound, or CT scan. ? Blood tests. ? A procedure to examine the inside of your colon (colonoscopy). ? More specialized tests to check:  Whether your anal sphincter works well. This is a ring-shaped muscle that controls the closing of the anus.  How well food moves through your colon. ? Tests to measure the nerve signal in your pelvic floor muscles (electromyography). How is this treated? Treatment for chronic constipation depends on the cause. Most often, treatment starts with:  Being more active and getting regular exercise.  Drinking more fluids.  Adding fiber to your diet. Sources of fiber include fruits, vegetables, whole grains, and fiber supplements.  Using medicines such as stool softeners or medicines that increase contractions in your digestive system (pro-motility agents).  Training your pelvic muscles with biofeedback.  Surgery, if there is obstruction. Treatment for secondary chronic constipation depends on the underlying condition. You may need to:  Stop or change some medicines if they cause constipation.  Use a fiber supplement (bulk laxative) or stool softener.  Use prescription laxative. This works by [employer] into your colon (osmotic laxative). You may also need to see a specialist who treats conditions of the digestive system (gastroenterologist). Follow these instructions at home:   Take over-the-counter and prescription medicines only as told by your health care provider.  If you are taking a laxative, take it as told by your health care provider.  Eat a balanced diet that includes enough fiber. Ask your health care provider to recommend a diet that is right for you.  Drink clear fluids, especially water. Avoid  drinking alcohol, caffeine, and soda.  Drink enough fluid to keep your urine pale yellow.  Get some physical activity every day. Ask your health care provider what physical activities are safe for you.  Get  colon cancer screenings as told by your health care provider.  Keep all follow-up visits as told by your health care provider. This is important. Contact a health care provider if:  You are having three or fewer bowel movements a week.  Your stools are hard or lumpy.  You notice blood on the toilet paper or in your stool after you have a bowel movement.  You have unexplained weight loss.  You have rectum (rectal) pain.  You have stool leakage.  You experience nausea or vomiting. Get help right away if:  You have rectal bleeding or you pass blood clots.  You have severe rectal pain.  You have body tissue that pushes out (protrudes) from your anus.  You have severe pain or bloating (distension) in your abdomen.  You have vomiting that you cannot control. Summary  Chronic constipation is a condition in which a person has three or fewer bowel movements a week, for three months or longer.  You may have a higher risk for this condition if you are an older adult, or if you do not drink enough water or get enough physical activity (are sedentary).  Treatment for this condition depends on the cause. Most treatments for chronic constipation include adding fiber to your diet, drinking more fluids, and getting more physical activity. You may also need to treat any underlying medical conditions or stop or change certain medicines if they cause constipation.  If lifestyle changes do not relieve constipation, your health care provider may recommend taking a laxative. This information is not intended to replace advice given to you by your health care provider. Make sure you discuss any questions you have with your health care provider. Document Revised: 03/29/2017 Document Reviewed:  01/01/2017 Elsevier Patient Education  2020 [place], [place] [facility]

## 2019-08-12 ENCOUNTER — Other Ambulatory Visit: Payer: Self-pay | Admitting: Family

## 2019-08-12 DIAGNOSIS — Z170 Estrogen receptor positive status [ER+]: Secondary | ICD-10-CM

## 2019-08-12 DIAGNOSIS — Z510 Encounter for antineoplastic radiation therapy: Secondary | ICD-10-CM | POA: Diagnosis not present

## 2019-08-12 DIAGNOSIS — C7951 Secondary malignant neoplasm of bone: Secondary | ICD-10-CM | POA: Diagnosis not present

## 2019-08-12 DIAGNOSIS — C50411 Malignant neoplasm of upper-outer quadrant of right female breast: Secondary | ICD-10-CM | POA: Diagnosis not present

## 2019-08-14 ENCOUNTER — Other Ambulatory Visit: Payer: Self-pay

## 2019-08-14 ENCOUNTER — Telehealth: Payer: Self-pay

## 2019-08-14 ENCOUNTER — Other Ambulatory Visit: Payer: Medicare Other | Admitting: *Deleted

## 2019-08-14 ENCOUNTER — Telehealth: Payer: Self-pay | Admitting: Licensed Clinical Social Worker

## 2019-08-14 DIAGNOSIS — Z23 Encounter for immunization: Secondary | ICD-10-CM | POA: Diagnosis not present

## 2019-08-14 DIAGNOSIS — Z515 Encounter for palliative care: Secondary | ICD-10-CM

## 2019-08-14 NOTE — Telephone Encounter (Signed)
RN returned call, voicemail left for return call.

## 2019-08-14 NOTE — Telephone Encounter (Signed)
Received TC from patient stating she is worried about transportation for her radiation appointments starting Monday. She states that she is set up for a car service (Lyft/ [employer] through [place] [employer]). However, she is having more trouble ambulating, is using her walker more, and does not feel she can get herself to the car. She has spoken with transportation but is worried that she has not heard an update.  This CSW contacted transportation department to check status. They will notify of the ambulation issue and see if they can get approval for a door-to-door service.   Edwinna Areola Kyera Felan, LCSW

## 2019-08-17 ENCOUNTER — Telehealth: Payer: Self-pay | Admitting: Internal Medicine

## 2019-08-17 ENCOUNTER — Ambulatory Visit
Admission: RE | Admit: 2019-08-17 | Discharge: 2019-08-17 | Disposition: A | Discharge status: Home / Self Care | Payer: Medicare Other | Source: Ambulatory Visit | Attending: Radiation Oncology | Admitting: Radiation Oncology

## 2019-08-17 ENCOUNTER — Other Ambulatory Visit: Payer: Self-pay

## 2019-08-17 DIAGNOSIS — C7951 Secondary malignant neoplasm of bone: Secondary | ICD-10-CM | POA: Diagnosis not present

## 2019-08-17 DIAGNOSIS — C50411 Malignant neoplasm of upper-outer quadrant of right female breast: Secondary | ICD-10-CM

## 2019-08-17 DIAGNOSIS — Z170 Estrogen receptor positive status [ER+]: Secondary | ICD-10-CM

## 2019-08-17 DIAGNOSIS — Z510 Encounter for antineoplastic radiation therapy: Secondary | ICD-10-CM | POA: Diagnosis not present

## 2019-08-17 MED ORDER — ALPRAZOLAM 0.25 MG PO TABS: 0.2500 mg | ORAL_TABLET | Freq: Three times a day (TID) | ORAL | 0 refills | Status: DC | PRN

## 2019-08-17 NOTE — Telephone Encounter (Signed)
I sent in 6 tablets.

## 2019-08-17 NOTE — Telephone Encounter (Signed)
Medication:Alprazolam & Losartan  Pharmacy: [address] [address] [place]    Pt is requesting medication be sent in today.

## 2019-08-17 NOTE — Progress Notes (Signed)
  Radiation Oncology         [phone]) [phone] ________________________________  Name: Erica Young MRN: [id number]  Date: 08/17/2019  DOB: March 24, 1941  Simulation Verification Note    ICD-10-CM   1. Bone metastases ([place])  C79.51     NARRATIVE: The patient was brought to the treatment unit and placed in the planned treatment position. The clinical setup was verified. Then port films were obtained and uploaded to the radiation oncology medical record software.  The treatment beams were carefully compared against the planned radiation fields. The position location and shape of the radiation fields was reviewed. They targeted volume of tissue appears to be appropriately covered by the radiation beams. Organs at risk appear to be excluded as planned.  Based on my personal review, I approved the simulation verification. The patient's treatment will proceed as planned.  -----------------------------------  Blair Promise, PhD, MD  This document serves as a record of services personally performed by Gery Pray, MD. It was created on his behalf by Clerance Lav, a trained medical scribe. The creation of this record is based on the scribe's personal observations and the provider's statements to them. This document has been checked and approved by the attending provider.

## 2019-08-17 NOTE — Telephone Encounter (Signed)
Pt would like a call once this is sent in. [phone]

## 2019-08-17 NOTE — Telephone Encounter (Signed)
Pt wants this filled today. I informed her Erica Young does not work Mondays and being that one of her prescriptions is a controlled substance we will have to wait until she comes back into the office tomorrow. Pt is upset b/c she states she thought it would be filled last week but never received noticed. She wants this sent to the Manager in order to get this filled today. Informed pt that I will send it back but we cannot guarantee that it can be done.   Pt would like a call back at [phone]

## 2019-08-17 NOTE — Telephone Encounter (Signed)
Spoke with patient. Patient was crying uncontrollably. Patient reports she is out of medication. Patient pleading with RN to see if someone can prescribe her just 3-5 pills until PCP returns tomorrow. Patient reports she has been on this medication since her husband died in April 05, 2019 and found out she has metastatic cancer. Dr. Elease Hashimoto do you mind sending in 3 pills til tomorrow when PCP arrives?

## 2019-08-17 NOTE — Telephone Encounter (Signed)
Patient notified that 6 tablets were sent in

## 2019-08-18 ENCOUNTER — Emergency Department (HOSPITAL_COMMUNITY)
Admission: EM | Admit: 2019-08-18 | Discharge: 2019-08-18 | Disposition: A | Discharge status: Home / Self Care | Payer: Medicare Other | Attending: Emergency Medicine | Admitting: Emergency Medicine

## 2019-08-18 ENCOUNTER — Emergency Department (HOSPITAL_COMMUNITY): Payer: Medicare Other

## 2019-08-18 ENCOUNTER — Other Ambulatory Visit: Payer: Self-pay

## 2019-08-18 ENCOUNTER — Ambulatory Visit: Payer: Medicare Other

## 2019-08-18 ENCOUNTER — Encounter (HOSPITAL_COMMUNITY): Payer: Self-pay

## 2019-08-18 ENCOUNTER — Other Ambulatory Visit: Payer: Self-pay | Admitting: Internal Medicine

## 2019-08-18 DIAGNOSIS — M549 Dorsalgia, unspecified: Secondary | ICD-10-CM | POA: Diagnosis present

## 2019-08-18 DIAGNOSIS — I10 Essential (primary) hypertension: Secondary | ICD-10-CM | POA: Diagnosis not present

## 2019-08-18 DIAGNOSIS — E039 Hypothyroidism, unspecified: Secondary | ICD-10-CM | POA: Insufficient documentation

## 2019-08-18 DIAGNOSIS — Z79899 Other long term (current) drug therapy: Secondary | ICD-10-CM | POA: Diagnosis not present

## 2019-08-18 DIAGNOSIS — Z87891 Personal history of nicotine dependence: Secondary | ICD-10-CM | POA: Diagnosis not present

## 2019-08-18 DIAGNOSIS — Z7982 Long term (current) use of aspirin: Secondary | ICD-10-CM | POA: Insufficient documentation

## 2019-08-18 DIAGNOSIS — G893 Neoplasm related pain (acute) (chronic): Secondary | ICD-10-CM | POA: Diagnosis not present

## 2019-08-18 DIAGNOSIS — M545 Low back pain: Secondary | ICD-10-CM | POA: Diagnosis not present

## 2019-08-18 DIAGNOSIS — C50411 Malignant neoplasm of upper-outer quadrant of right female breast: Secondary | ICD-10-CM

## 2019-08-18 DIAGNOSIS — Z8673 Personal history of transient ischemic attack (TIA), and cerebral infarction without residual deficits: Secondary | ICD-10-CM | POA: Diagnosis not present

## 2019-08-18 DIAGNOSIS — Z170 Estrogen receptor positive status [ER+]: Secondary | ICD-10-CM

## 2019-08-18 DIAGNOSIS — Z853 Personal history of malignant neoplasm of breast: Secondary | ICD-10-CM | POA: Insufficient documentation

## 2019-08-18 LAB — CBC
HCT: 39.8 % (ref 36.0–46.0)
Hemoglobin: 13.4 g/dL (ref 12.0–15.0)
MCH: 31.9 pg (ref 26.0–34.0)
MCHC: 33.7 g/dL (ref 30.0–36.0)
MCV: 94.8 fL (ref 80.0–100.0)
Platelets: 245 10*3/uL (ref 150–400)
RBC: 4.2 MIL/uL (ref 3.87–5.11)
RDW: 13.1 % (ref 11.5–15.5)
WBC: 5.1 10*3/uL (ref 4.0–10.5)
nRBC: 0 % (ref 0.0–0.2)

## 2019-08-18 LAB — BASIC METABOLIC PANEL
Anion gap: 10 (ref 5–15)
BUN: 11 mg/dL (ref 8–23)
CO2: 25 mmol/L (ref 22–32)
Calcium: 9.1 mg/dL (ref 8.9–10.3)
Chloride: 94 mmol/L — ABNORMAL LOW (ref 98–111)
Creatinine, Ser: 0.69 mg/dL (ref 0.44–1.00)
GFR calc Af Amer: >60 mL/min (ref >60)
GFR calc non Af Amer: >60 mL/min (ref >60)
Glucose, Bld: 128 mg/dL — ABNORMAL HIGH (ref 70–99)
Potassium: 4.3 mmol/L (ref 3.5–5.1)
Sodium: 129 mmol/L — ABNORMAL LOW (ref 135–145)

## 2019-08-18 MED ORDER — HYDROMORPHONE HCL 1 MG/ML IJ SOLN
0.5000 mg | Freq: Once | INTRAMUSCULAR | Status: AC
  Administered 2019-08-18: 0.5 mg via INTRAVENOUS
  Filled 2019-08-18: qty 1

## 2019-08-18 MED ORDER — ONDANSETRON HCL 4 MG/2ML IJ SOLN
4.0000 mg | Freq: Once | INTRAMUSCULAR | Status: AC
  Administered 2019-08-18: 4 mg via INTRAVENOUS
  Filled 2019-08-18: qty 2

## 2019-08-18 MED ORDER — HYDROCODONE-ACETAMINOPHEN 5-325 MG PO TABS: 1.0000 | ORAL_TABLET | Freq: Four times a day (QID) | ORAL | 0 refills | Status: DC | PRN

## 2019-08-18 MED ORDER — ALPRAZOLAM 0.25 MG PO TABS: 0.2500 mg | ORAL_TABLET | Freq: Three times a day (TID) | ORAL | 2 refills | Status: DC | PRN

## 2019-08-18 MED ORDER — ONDANSETRON 4 MG PO TBDP: 4.0000 mg | ORAL_TABLET | Freq: Three times a day (TID) | ORAL | 0 refills | Status: AC | PRN

## 2019-08-18 NOTE — ED Notes (Signed)
Pt refusing CT scan. States she "has cancer and that's why it hurts". Dr. Alvino Chapel made aware.

## 2019-08-18 NOTE — ED Provider Notes (Signed)
[facility] DEPT Provider Note   CSN: [id number] Arrival date & time: 08/18/19  7253     History Chief Complaint  Patient presents with  . Back Pain  . Leg Pain    Erica Young is a 79 y.o. female.  HPI Patient presents with back pain.  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  States pain is severe.  States been worse for the last 3 to 4 days.  She has been taking Tylenol for but does not work.  States she did not want other medicines that would make her constipated.  No fevers.  States the pain goes down the right leg.  States that has a cane she can use to walk but states she really cannot walk because the pain is so severe at this time.  No fevers or chills.  No lack bladder or bowel control.  Patient states the pain goes down to her lower leg and foot.    Past Medical History:  Diagnosis Date  . Allergy   . Anxiety   . Bone cancer ([place]) mri 11/11/14   right parietal bone,left cribriform plate metastases  . Cancer [facility]) 2007   right breat ca  , lumpectomy and radiation tx (declined chemo and additional prophylactic meds)  . Cataract    both eyes  10/2016 Lt.     04/2017 Rt.   . CEREBROVASCULAR DISEASE 03/03/2010  . Diverticulitis   . DIVERTICULOSIS, COLON 03/03/2010  . Fatty liver 08/02/09   as per U/S done by [facility]  . Foramen ovale    positive bubble study  . GERD (gastroesophageal reflux disease)   . Headache(784.0)    she thinks sinus headaches  . History of cerebral artery stenosis    right middle  . HYPERLIPIDEMIA 03/03/2010  . Hypertension   . HYPOTHYROIDISM 03/03/2010   no longer on meds  . Internal hemorrhoid   . Low back pain   . Mass of sinus   . Mastoiditis    noted on brain MRI  . Rib fracture   . Right leg pain   . Stroke [facility] Oct. 2011   TIA  . Ulcer     Patient Active Problem List   Diagnosis Date Noted  . Liver metastasis ([place]) 07/27/2019  . Goals  of care, counseling/discussion 05/12/2019  . Syncope   . Acute systolic (congestive) heart failure ([place])   . Shortness of breath   . Acute left-sided weakness 01/29/2019  . Leukopenia due to antineoplastic chemotherapy ([place]) 01/29/2019  . Anxiety 01/29/2019  . Aortic atherosclerosis ([place]) 01/03/2018  . History of epistaxis 10/15/2017  . GI symptoms/possible food intolerance 06/18/2017  . Malignant neoplasm of upper-outer quadrant of right breast in female, estrogen receptor positive ([place]) 01/19/2016  . Bone metastases ([place]) 11/22/2014  . Perennial allergic rhinitis with a nonallergic component 02/12/2014  . Cancer of right breast, stage 4 ([place]) 01/23/2014  . History of breast cancer 01/11/2014  . Chronic rhinitis 09/17/2013  . Hilar adenopathy 05/12/2013  . Abdominal pain 05/07/2011  . Hypertension 06/09/2010  . BACK PAIN 04/10/2010  . Dyslipidemia 03/03/2010  . Cerebrovascular disease 03/03/2010  . DIVERTICULOSIS, COLON 03/03/2010    Past Surgical History:  Procedure Laterality Date  . BREAST LUMPECTOMY     right  . CATARACT EXTRACTION Left    10/2016   Rt 04/2017  . CHOLECYSTECTOMY    . COLONOSCOPY    . POLYPECTOMY     benign  cecum  . SHOULDER SURGERY     right shoulder (dislocation)  . TONSILLECTOMY    . TUBAL LIGATION    . UPPER GASTROINTESTINAL ENDOSCOPY    . VIDEO BRONCHOSCOPY WITH ENDOBRONCHIAL ULTRASOUND N/A 01/07/2014   Procedure: VIDEO BRONCHOSCOPY WITH ENDOBRONCHIAL ULTRASOUND;  Surgeon: Ivin Poot, MD;  Location: [facility] OR;  Service: Thoracic;  Laterality: N/A;     OB History    Gravida  4   Para  2   Term      Preterm      AB      Living        SAB      TAB      Ectopic      Multiple      Live Births              Family History  Problem Relation Age of Onset  . Heart disease Sister        cerebral vascular disease also  . Heart disease Brother        cerebral vascular disease also  . Heart disease Brother        cerebral  vascular disease  . Heart disease Sister        cebreal vascular disease also  . Heart disease Sister        cebreal vascular diease also  . Heart disease Sister        cerebral vascular diease also  . Stomach cancer Father   . Colon cancer Neg Hx   . Esophageal cancer Neg Hx   . Rectal cancer Neg Hx   . Colon polyps Neg Hx   . Asthma Neg Hx     Social History   Tobacco Use  . Smoking status: Former Smoker    Packs/day: 0.50    Years: 20.00    Pack years: 10.00    Types: Cigarettes    Start date: 47    Quit date: 05/01/1991    Years since quitting: 28.3  . Smokeless tobacco: Never Used  Substance Use Topics  . Alcohol use: No    Alcohol/week: 0.0 standard drinks  . Drug use: No    Home Medications Prior to Admission medications   Medication Sig Start Date End Date Taking? Authorizing Provider  ALPRAZolam (XANAX) 0.25 MG tablet Take 1 tablet (0.25 mg total) by mouth 3 (three) times daily as needed. for anxiety Patient taking differently: Take 0.25 mg by mouth 3 (three) times daily. for anxiety 08/17/19  Yes Burchette, Alinda Sierras, MD  aspirin EC 81 MG tablet Take 81 mg by mouth daily after breakfast.    Yes [provider]  carvedilol (COREG) 12.5 MG tablet Take 1 tablet (12.5 mg total) by mouth 2 (two) times daily with a meal. 06/05/19  Yes Isaac Bliss, Rayford Halsted, MD  Cholecalciferol (VITAMIN D PO) Take 1,000 Units by mouth daily.    Yes [provider]  docusate sodium (COLACE) 100 MG capsule Take 100 mg by mouth daily.   Yes [provider]  Glucosamine 500 MG CAPS Take 500 mg by mouth in the morning and at bedtime.    Yes [provider]  loratadine (CLARITIN) 10 MG tablet Take 1 tablet (10 mg total) by mouth daily. 04/16/19  Yes Isaac Bliss, Rayford Halsted, MD  losartan (COZAAR) 25 MG tablet Take 0.5 tablets (12.5 mg total) by mouth daily. 02/05/19  Yes Danford, Suann Larry, MD  spironolactone (ALDACTONE) 25 MG tablet Take 0.5 tablets  (  12.5 mg total) by mouth daily. 02/05/19  Yes Danford, Suann Larry, MD  ALPRAZolam Duanne Moron) 0.25 MG tablet TAKE 1 TABLET BY MOUTH THREE TIMES DAILY AS NEEDED FOR ANXIETY Patient not taking: Reported on 08/18/2019 08/18/19   Isaac Bliss, Rayford Halsted, MD  exemestane (AROMASIN) 25 MG tablet Take 1 tablet (25 mg total) by mouth daily after breakfast. 07/27/19   Magrinat, Virgie Dad, MD  HYDROcodone-acetaminophen (NORCO/VICODIN) 5-325 MG tablet Take 1 tablet by mouth every 6 (six) hours as needed. 08/18/19   Davonna Belling, MD  meclizine (ANTIVERT) 25 MG tablet Take 1 tablet (25 mg total) by mouth 3 (three) times daily as needed for dizziness. Patient not taking: Reported on 08/18/2019 07/14/19   Kennyth Arnold, FNP  ondansetron (ZOFRAN-ODT) 4 MG disintegrating tablet Take 1 tablet (4 mg total) by mouth every 8 (eight) hours as needed for nausea or vomiting. 08/18/19   Davonna Belling, MD    Allergies    Ciprofloxacin  Review of Systems   Review of Systems  Constitutional: Negative for appetite change.  HENT: Negative for congestion.   Cardiovascular: Negative for chest pain.  Gastrointestinal: Negative for abdominal pain.  Musculoskeletal: Positive for back pain. Negative for neck pain.  Neurological: Negative for weakness and headaches.    Physical Exam Updated Vital Signs BP 139/74 (BP Location: Left Arm)   Pulse 73   Temp 98.3 F (36.8 C) (Oral)   Resp 15   Ht 5\' 1"  (1.549 m)   Wt 53.1 kg   SpO2 93%   BMI 22.11 kg/m   Physical Exam Vitals and nursing note reviewed.  Eyes:     Extraocular Movements: Extraocular movements intact.  Cardiovascular:     Rate and Rhythm: Normal rate.  Abdominal:     Tenderness: There is no abdominal tenderness.  Musculoskeletal:     Cervical back: Neck supple.     Comments: Tenderness over lumbar spine.  Pain with flexion at the hip.  Sensation grossly intact in right lower extremity.  No tenderness over hip.  Good flexion and extension at the  foot.  Able to extend at the knee but somewhat limited by pain in the back.  Skin:    General: Skin is warm.     Capillary Refill: Capillary refill takes less than 2 seconds.  Neurological:     Mental Status: She is alert.     Comments: Patient is awake and appropriate.     ED Results / Procedures / Treatments   Labs (all labs ordered are listed, but only abnormal results are displayed) Labs Reviewed  BASIC METABOLIC PANEL - Abnormal; Notable for the following components:      Result Value   Sodium 129 (*)    Chloride 94 (*)    Glucose, Bld 128 (*)    All other components within normal limits  CBC    EKG None  Radiology No results found.  Procedures Procedures (including critical care time)  Medications Ordered in ED Medications  HYDROmorphone (DILAUDID) injection 0.5 mg (0.5 mg Intravenous Given 08/18/19 1038)  ondansetron (ZOFRAN) injection 4 mg (4 mg Intravenous Given 08/18/19 1210)    ED Course  I have reviewed the triage vital signs and the nursing notes.  Pertinent labs & imaging results that were available during my care of the patient were reviewed by me and considered in my medical decision making (see chart for details).    MDM Rules/Calculators/A&P  Patient presents with back pain.  Has known mets to the spine.  First dose of radiation yesterday.  Some radiation down leg but no loss of bladder or bowel control.  Is on only Tylenol for pain control.  Initially was going to order CT but after discussion with patient she states she knows what is causing the pain does not feel like she needs further imaging this time.  I do agree that it is likely the cause.  No new injury.  Does have follow-up with radiation oncology later today.  Has been given pain medicine here and feel somewhat better.  Discussed with the patient's son on the phone and he stated that she has been having some difficulty at home and has been having difficulty getting her to  exercise move around.  Consulted transitions of care and they have arranged for some home health.  Face-to-face filled out.  Discharge home with pain control. Final Clinical Impression(s) / ED Diagnoses Final diagnoses:  Cancer associated pain    Rx / DC Orders ED Discharge Orders         [place]     08/18/19 1402    Face-to-face encounter (required for Medicare/Medicaid patients)    Comments: I Davonna Belling certify that this patient is under my care and that I, or a nurse practitioner or physician's assistant working with me, had a face-to-face encounter that meets the physician face-to-face encounter requirements with this patient on 08/18/2019. The encounter with the patient was in whole, or in part for the following medical condition(s) which is the primary reason for home health care (List medical condition): metastatic cancer   08/18/19 1402    HYDROcodone-acetaminophen (NORCO/VICODIN) 5-325 MG tablet  Every 6 hours PRN     08/18/19 1403    ondansetron (ZOFRAN-ODT) 4 MG disintegrating tablet  Every 8 hours PRN     08/18/19 1404           Davonna Belling, MD 08/18/19 1424

## 2019-08-18 NOTE — Telephone Encounter (Signed)
Alprazolam sent. Can we get more info about what she would like to speak to me about? May need to schedule a visit if complex situation.

## 2019-08-18 NOTE — ED Notes (Signed)
Contacted patient's son to notify of discharge , son states he is on the way to get her.

## 2019-08-18 NOTE — Progress Notes (Signed)
COMMUNITY PALLIATIVE CARE RN NOTE  PATIENT NAME: Erica Young DOB: 05/18/1940 MRN: [id number]  PRIMARY CARE PROVIDER: Isaac Bliss, Rayford Halsted, MD  RESPONSIBLE PARTY:  Acct ID - Guarantor Home Phone Work Phone Relationship Industry Type  [id number] [place] [phone]  Self P/F     [place], [place], [place] [address]   Due to the COVID-19 crisis, this virtual check-in visit was done via telephone from my office and it was initiated and consent by this patient and or family.  PLAN OF CARE and INTERVENTION:  1. ADVANCE CARE PLANNING/GOALS OF CARE: Goal is for patient to remain in her home. She has a DNR. 2. PATIENT/CAREGIVER EDUCATION: Symptom management, bowel management, safe mobility 3. DISEASE STATUS: Virtual check-in visit completed via telephone. Patient reports that she has been experiencing increased pain in her lower back for the past 2 days to the point where it is becoming more difficult for her to walk. She also complains of headaches, sinus pressure and blurred vision from a mass in her nasal cavity, but has stated numerous times that she does not want to treat this d/t the side effects of the options given. She says that she does not like taking pain medication. She states that she has placed a call to the cancer center regarding transportation to her radiation appointments starting next week to treat her spine mets, because she is having difficulty walking . She is more tired and fatigued and spending more time in bed.  Also reports dizziness when standing and moving around. She also speaks about not being able to think clearly. She is having issues with constipation. She has started taking Miralax to help. She is also drinking apple juice, which she feels usually helps her. Performing ADLs is becoming more difficult for her. Her appetite is poor. She feels that she is just getting sicker and feels bad all the time. She says her son helps her with grocery shopping. She continues to  speak with a Bereavement counselor weekly to discuss her feelings of sadness and she feels this is helpful. Will continue to monitor.  HISTORY OF PRESENT ILLNESS:  This is a 79 yo female who resides at home alone. Palliative care continues to follow patient and visits monthly and PRN.  CODE STATUS: DNR ADVANCED DIRECTIVES: Y MOST FORM: no PPS: 50%   (Duration of visit and documentation 30 minutes)   Daryl Eastern, RN BSN

## 2019-08-18 NOTE — TOC Initial Note (Signed)
Transition of Care [facility]) - Initial/Assessment Note    Patient Details  Name: Erica Young MRN: [id number] Date of Birth: 1940-07-05  Transition of Care [facility]) CM/SW Contact:    Erenest Rasher, RN Phone Number: [phone] 08/18/2019, 1:56 PM  Clinical Narrative:                 TOC CM spoke to pt and states she lives alone. [place] arranges her transport to her appts for radiation. Pt states she does not want to miss any of her appts for radiation. Offered choice for [facility]. Pt agreeable to [place]. She declines rehab but is agreeable to being evaluated by PT. Attending ED sent message for PT evaluation for possible SNF. States she is having more difficulty walking. She uses her RW at home. Referral sent to [facility] rep, Cora. Waiting confirmation.   Expected Discharge Plan: [place] Barriers to Discharge: Continued Medical Work up   Patient Goals and CMS Choice Patient states their goals for this hospitalization and ongoing recovery are:: prefers to go home but will consider rehab. states she just does not want to miss her treatments CMS Medicare.gov Compare Post Acute Care list provided to:: Patient Choice offered to / list presented to : Patient  Expected Discharge Plan and Services Expected Discharge Plan: [place] In-house Referral: Clinical Social Work Discharge Planning Services: CM Consult Post Acute Care Choice: [place] arrangements for the past 2 months: Single Family Home                           HH Arranged: RN, PT, Nurse's Aide, Social Work [place] Agency: [place] Date [place]: 08/18/19 Time [place]: 74 Representative spoke with at [place]: Adela Lank  Prior Living Arrangements/Services Living arrangements for the past 2 months: [place] with:: Self Patient language and need for interpreter reviewed:: Yes Do you feel safe going back to the  place where you live?: Yes      Need for Family Participation in Patient Care: Yes (Comment) Care giver support system in place?: No (comment) Current home services: DME(rolling walker) Criminal Activity/Legal Involvement Pertinent to Current Situation/Hospitalization: No - Comment as needed  Activities of Daily Living      Permission Sought/Granted Permission sought to share information with : Case Manager, PCP, Facility Sport and exercise psychologist, Family Supports Permission granted to share information with : Yes, Verbal Permission Granted  Share Information with NAME: [name]  Permission granted to share info w AGENCY: [place], SNF rehab  Permission granted to share info w Relationship: son  Permission granted to share info w Contact Information: [phone]  Emotional Assessment       Orientation: : Oriented to Self, Oriented to Place, Oriented to Situation, Oriented to  Time      Admission diagnosis:  Back Pain; Leg Pain Cancer Pt. Patient Active Problem List   Diagnosis Date Noted  . Liver metastasis ([place]) 07/27/2019  . Goals of care, counseling/discussion 05/12/2019  . Syncope   . Acute systolic (congestive) heart failure ([place])   . Shortness of breath   . Acute left-sided weakness 01/29/2019  . Leukopenia due to antineoplastic chemotherapy ([place]) 01/29/2019  . Anxiety 01/29/2019  . Aortic atherosclerosis ([place]) 01/03/2018  . History of epistaxis 10/15/2017  . GI symptoms/possible food intolerance 06/18/2017  . Malignant neoplasm of upper-outer quadrant of right breast  in female, estrogen receptor positive ([place]) 01/19/2016  . Bone metastases ([place]) 11/22/2014  . Perennial allergic rhinitis with a nonallergic component 02/12/2014  . Cancer of right breast, stage 4 ([place]) 01/23/2014  . History of breast cancer 01/11/2014  . Chronic rhinitis 09/17/2013  . Hilar adenopathy 05/12/2013  . Abdominal pain 05/07/2011  . Hypertension 06/09/2010  . BACK PAIN 04/10/2010   . Dyslipidemia 03/03/2010  . Cerebrovascular disease 03/03/2010  . DIVERTICULOSIS, COLON 03/03/2010   PCP:  Isaac Bliss, Rayford Halsted, MD Pharmacy:   [facility] [place], [address] [place] [employer] [place] [address] Phone: [phone] Fax: [phone]     Social Determinants of Health (SDOH) Interventions    Readmission Risk Interventions No flowsheet data found.

## 2019-08-18 NOTE — Addendum Note (Signed)
Addended by: Westley Hummer B on: 08/18/2019 05:01 PM   Modules accepted: Orders

## 2019-08-18 NOTE — Progress Notes (Signed)
Patient states her son will pick her up today. [place], [place] ED TOC CM [phone]

## 2019-08-18 NOTE — ED Triage Notes (Signed)
Patient is a cancer patient. Patient states she started chemo yesterday. Patient c/o right lower back pain that radiates into the right leg 3-4 days ago.

## 2019-08-18 NOTE — Discharge Instructions (Signed)
Your PCP and cancer doctors can help with your pain medicine regimen.  Home health has been arranged to help with pain control and they will manage more at home.  Take Colace or MiraLAX if you are taking pain medicine.

## 2019-08-18 NOTE — Telephone Encounter (Signed)
Pt is wondering if her PCP will send in the rest of the Alprazolam. She received 6 tablets yesterday and her son is with her now and would like for Jerilee Hoh to fill her medication now. She would also like for her to call her, she states she just left the hospital (did not say what for).   Pt can be reached at [phone]

## 2019-08-18 NOTE — Telephone Encounter (Signed)
Spoke with patient and the pain has increased.  Patient request help at home because she is by herself. [facility] referral placed.

## 2019-08-19 ENCOUNTER — Other Ambulatory Visit: Payer: Self-pay | Admitting: Oncology

## 2019-08-19 ENCOUNTER — Ambulatory Visit
Admission: RE | Admit: 2019-08-19 | Discharge: 2019-08-19 | Disposition: A | Discharge status: Home / Self Care | Payer: Medicare Other | Source: Ambulatory Visit | Attending: Radiation Oncology | Admitting: Radiation Oncology

## 2019-08-19 ENCOUNTER — Other Ambulatory Visit: Payer: Self-pay | Admitting: Internal Medicine

## 2019-08-19 DIAGNOSIS — C7951 Secondary malignant neoplasm of bone: Secondary | ICD-10-CM | POA: Diagnosis not present

## 2019-08-19 DIAGNOSIS — Z510 Encounter for antineoplastic radiation therapy: Secondary | ICD-10-CM | POA: Diagnosis not present

## 2019-08-19 DIAGNOSIS — C50411 Malignant neoplasm of upper-outer quadrant of right female breast: Secondary | ICD-10-CM | POA: Diagnosis not present

## 2019-08-19 MED ORDER — SPIRONOLACTONE 25 MG PO TABS: 12.5000 mg | ORAL_TABLET | Freq: Every day | ORAL | 2 refills | Status: DC

## 2019-08-19 MED ORDER — LOSARTAN POTASSIUM 25 MG PO TABS: 12.5000 mg | ORAL_TABLET | Freq: Every day | ORAL | 2 refills | Status: DC

## 2019-08-19 NOTE — Telephone Encounter (Signed)
Medication:Losartan & Spironolactone  Pharmacy: [facility] [address] [place]

## 2019-08-19 NOTE — Telephone Encounter (Signed)
Okay to fill spironolactone (ALDACTONE) 25 MG tablet?

## 2019-08-20 ENCOUNTER — Telehealth: Payer: Self-pay | Admitting: Internal Medicine

## 2019-08-20 ENCOUNTER — Ambulatory Visit
Admission: RE | Admit: 2019-08-20 | Discharge: 2019-08-20 | Disposition: A | Discharge status: Home / Self Care | Payer: Medicare Other | Source: Ambulatory Visit | Attending: Radiation Oncology | Admitting: Radiation Oncology

## 2019-08-20 ENCOUNTER — Other Ambulatory Visit: Payer: Self-pay

## 2019-08-20 DIAGNOSIS — C7951 Secondary malignant neoplasm of bone: Secondary | ICD-10-CM | POA: Diagnosis not present

## 2019-08-20 DIAGNOSIS — Z510 Encounter for antineoplastic radiation therapy: Secondary | ICD-10-CM | POA: Diagnosis not present

## 2019-08-20 DIAGNOSIS — C50411 Malignant neoplasm of upper-outer quadrant of right female breast: Secondary | ICD-10-CM | POA: Diagnosis not present

## 2019-08-20 NOTE — Telephone Encounter (Signed)
She is receiving palliative radiation to a metastasis to her spine for pain management. I am 100% agreeable to hospice care.

## 2019-08-20 NOTE — Telephone Encounter (Signed)
Verbal orders given to [facility].  They will send someone to the home ASAP.

## 2019-08-20 NOTE — Telephone Encounter (Signed)
Amber from [facility] stated the pt called them and said she is in a lot of pain and very sick. She was very emotional and stated that she spoke to her PCP about Hospice. She wants someone to come out and see her. Amber states the pt is still receiving radation and sees order for home health with [facility]. Amber is not sure what to do from here b/c if she is still receiving radiation then they will not be able to provide her care through Hospice.   Amber can be reached at [phone] - ok to leave a detailed message per [facility] secure VM

## 2019-08-21 ENCOUNTER — Other Ambulatory Visit: Payer: Self-pay

## 2019-08-21 ENCOUNTER — Ambulatory Visit
Admission: RE | Admit: 2019-08-21 | Discharge: 2019-08-21 | Disposition: A | Discharge status: Home / Self Care | Payer: Medicare Other | Source: Ambulatory Visit | Attending: Radiation Oncology | Admitting: Radiation Oncology

## 2019-08-21 DIAGNOSIS — C50411 Malignant neoplasm of upper-outer quadrant of right female breast: Secondary | ICD-10-CM | POA: Diagnosis not present

## 2019-08-21 DIAGNOSIS — C7951 Secondary malignant neoplasm of bone: Secondary | ICD-10-CM | POA: Diagnosis not present

## 2019-08-21 DIAGNOSIS — Z510 Encounter for antineoplastic radiation therapy: Secondary | ICD-10-CM | POA: Diagnosis not present

## 2019-08-21 NOTE — Telephone Encounter (Signed)
Sreejesh from [place] stated he went to see the pt today and upon arrival the pt informed him that she does not want to do PT and asked for hospice to come out. He wanted to inform her PCP that she has denied his services and pt request a referral to hospice.    Andreas [place] can be reached at [phone] -ok to leave a detailed message

## 2019-08-21 NOTE — Telephone Encounter (Signed)
Pt wanted to let Dr. Jerilee Young know that Erica Young came for physical therapy today and she did not want to do physical therapy. She said that is not the correct treatment for her she was expecting hospice care. She is in a lot of pain and she needs hospice as soon as possible.

## 2019-08-21 NOTE — Telephone Encounter (Signed)
Spoke with patient and explained that PT is not needed at this time.  reassured patient that Hospice will be in touch with her soon.

## 2019-08-24 ENCOUNTER — Telehealth: Payer: Self-pay | Admitting: *Deleted

## 2019-08-24 ENCOUNTER — Ambulatory Visit
Admission: RE | Admit: 2019-08-24 | Discharge: 2019-08-24 | Disposition: A | Discharge status: Home / Self Care | Payer: Medicare Other | Source: Ambulatory Visit | Attending: Radiation Oncology | Admitting: Radiation Oncology

## 2019-08-24 ENCOUNTER — Other Ambulatory Visit: Payer: Self-pay | Admitting: Internal Medicine

## 2019-08-24 ENCOUNTER — Other Ambulatory Visit: Payer: Self-pay | Admitting: *Deleted

## 2019-08-24 ENCOUNTER — Other Ambulatory Visit: Payer: Self-pay | Admitting: Oncology

## 2019-08-24 ENCOUNTER — Other Ambulatory Visit: Payer: Self-pay

## 2019-08-24 DIAGNOSIS — C50411 Malignant neoplasm of upper-outer quadrant of right female breast: Secondary | ICD-10-CM | POA: Diagnosis not present

## 2019-08-24 DIAGNOSIS — Z510 Encounter for antineoplastic radiation therapy: Secondary | ICD-10-CM | POA: Diagnosis not present

## 2019-08-24 DIAGNOSIS — C7951 Secondary malignant neoplasm of bone: Secondary | ICD-10-CM | POA: Diagnosis not present

## 2019-08-24 MED ORDER — HYDROCODONE-ACETAMINOPHEN 5-325 MG PO TABS: 1.0000 | ORAL_TABLET | Freq: Four times a day (QID) | ORAL | 0 refills | Status: DC | PRN

## 2019-08-24 NOTE — Telephone Encounter (Signed)
Patient went to the ED for her pain and they gave her pain medication and she's wanting to know if Dr. Jerilee Hoh can refill the Rx because she is in so much pain.  HYDROcodone-acetaminophen (NORCO/VICODIN) 5-325 MG tablet   [id number] - [place], [place] - [address] AT [address] Phone:  [phone]  Fax:  [phone]     I advised the patient that Jerilee Hoh is out of the office today and the patient was wanting to know what is she supposed to do till tomorrow. I sent the patient to the nurse triage line to help with suggestions for the patient.

## 2019-08-24 NOTE — Patient Outreach (Addendum)
[place] [facility]) Care Management  08/24/2019  Erica Young 1941-01-30 [id number]   [facility] outreach to complex care patient (referred to [facility] 02/27/19 after Erica Young  skilled nursing facility (snf) discharge by [facility] Erica Samples MSN-Ed, RN, BSN [facility]) coordinator)  Plus  Recent MD referral (Dr Erica Young per on 08/19/19 for active [facility] patient  for 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   Erica Young reports she has been having pain for a long time in her back and leg. reports having pain before starting radiation. Today more in her back  She reports  radiation treatment stated on last week She reports the pain increased more with her ambulation. She has pain medication but reports it cause constipation and she is having to take Maalox with the pain medicine She informs [facility] RN CM her back pain is at 10 on a scale of 1-10 with 10 being the worst pain she can endure. She reports pain in her leg is not as bad today  Support system Son has problem with his knees and does not live locally, and does not feel son's wife will allow her to stay with them. She does not want to climb stairs in their home Her home is single level Lives alone. She reports not having anyone she can rely on. She previously had a sister from [place] to stay with her after the passing of her husband. She informs [facility] RN CM she has not spoken with her family about her present need. She was encouraged to speak with her family   "I would like to go some place  to stay that would take me to my treatment " She reports she is to have 2 weeks of treatment. She reports her treatment finishes on "Thursday" Sep 03 2019    [facility] RN CM discussed personal care services and/or facility placement.  THN RN CM discussed possible out of pocket expense to her. THN RN CM discussed the process of personal care services and facility placement does take some time.  She voiced understanding  Note sent to pcp, oncology and oncology radiation provider via Epic in basket Sent message to Oncology SW.  [facility] account planner) involved for palliative care at this tim As discussed with Erica Young earlier today Hospice care not active as Erica Young  is actively receiving daily radiation. Patient had called [facility] on Saturday 08/22/19 requesting hospice for increasing pain but decided to continue radiation until next week  [facility] SW consult ordered    [facility] RN CM reminded Erica Young of the on call MD services and emergency services contact numbers. She tells [facility] RN CM she will be okay this evening as she is going to bed to rest.    [facility] RN CM will follow up with Erica Young within the next 7-10 business days  [facility] RN CM sent a referral/consult to [facility] for Patient needs assistance with personal care services or facility placement. Breast cancer patient with mets to spine receiving daily palliative radiation with unmanaged lower back and leg pain.  [facility] RN  CM discussed possible out of pocket expense to her She denies support system. Reports her son does not live local, has medical problems of his own and does not feel son's wife will allow her to stay with them. Lives alone. [facility] account planner) involved Pt encouraged to return a call to [facility] RN CM prn Routed note to MD  [facility] CM Care Plan Problem One     Most Recent Value  Care Plan Problem One  Breast cancer with bone mets home care needs  Role Documenting the Problem One  Care Management Telephonic Coordinator  Care Plan for Problem One   Active  THN Long Term Goal   over the next 60 days patient will receive knowledge of services and resources for home care of breast cancer with spine mets  THN Long Term Goal Start Date  08/24/19  [facility] CM Short Term Goal #1   over the next 30 days patient will be offered [facility] SW if she chooses for grief related to the loss of her husband  THN CM Short Term Goal #1 Start Date  03/11/19  [facility] CM Short Term Goal #1 Met Date  03/16/19  THN CM Short Term Goal #2   over the next 30 days patient will SW assistance from [facility] or [facility] oncology for personal care services ior facility placement  [facility] CM Short Term Goal #2 Start Date  08/24/19  Interventions for Short Term Goal #2  assessed present concerns, encourage contact of on call MD or EMS discussed personal care services and/or facility placement.  THN RN CM discussed possible out of pocket expense to her. THN RN CM discussed the process of personal care services and facility placement does take some time. [facility] SW referral Note sent to pcp, oncology and oncology radiation provider via Epic in basket Sent message to [place] Lavina Hamman, RN, BSN, [place] Coordinator Office number [phone] Mobile number [phone]  Main THN number [phone] Fax number [phone] .

## 2019-08-24 NOTE — Telephone Encounter (Signed)
Returned patient's phone call, lvm for a return call 

## 2019-08-24 NOTE — Addendum Note (Signed)
Addended by: Barbaraann Faster on: 08/24/2019 07:36 PM   Modules accepted: Orders

## 2019-08-24 NOTE — Patient Outreach (Signed)
[facility] [facility]) Care Management  08/24/2019  Franchesca Veneziano 1940/07/24 [id number]   [facility] outreach to complex care patient ([facility] 02/27/19 after Miquel Dunn  skilled nursing facility (snf) discharge by [facility] Pollyann Samples MSN-Ed, RN, BSN [facility]) coordinator)    Recent MD referral (Dr Isaac Bliss per  on 08/19/19 for active [facility] patient  for consult cancer with mets C50.411,Z17.0 (ICD-10-CM) - Malignant neoplasm of upper-outer quadrant of right breast in female, estrogen receptor positive      THN RN CM outreach to Mrs Trueheart to for agreed upon follow up outreach to her after last outreach and to complete 08/19/19 MD referral [facility] RN CM greeted Mrs Ragsdale and discussed the follow up outreach Mrs Dilday informs [facility] RN CM she is not able to speak at this time as she is awaiting a call about transportation to her 08/24/19 treatment clinic and voiced wanting to keep her telephone line open. She discusses she may be available later to speak [place] voiced understanding and concluded the call     [facility] RN CM notes an [facility] ED visit note on 08/18/19 for cancer associated pain and a listed 08/18/19 no show to a daily radiology oncology treatment (related to ED visit for pain) since last allowed [facility] outreach  Noted a 08/21/19 Epic note refusing [facility]ters PT   Outreach to [employer] staff for clarity on palliative/hospice services [facility] RN CM called and spoke with [name] at [phone] to review and clarified [facility] services. [name] confirmed on today 08/19/19 Mrs Parslow is still palliative care NOT hospice [name] explained Mrs Woodson had called [facility] requesting hospice on 08/18/19 related to pain but when an [facility] hospice nurse arrived on 08/22/19, Saturday, Mrs Mccarn decided she wanted to continue her radiation treatments. She will continue radiation treatments until 09/03/19 and then it will be determined if she will be enrolled in hospice. THN RN CM will not be able to provide  services once patient becomes a hospice patient. THN RN CM thanked [employer]  Plan [facility] RN CM will follow up with Mrs Munoz within the next 4-7 days if no return calls from her Pt encouraged to return a call to [place] CM prn Routed note to MD    [name], RN, BSN, [place] Coordinator Office number [phone] Mobile number [phone]  Main THN number [phone] Fax number [phone]

## 2019-08-25 ENCOUNTER — Other Ambulatory Visit: Payer: Self-pay

## 2019-08-25 ENCOUNTER — Encounter: Payer: Self-pay | Admitting: *Deleted

## 2019-08-25 ENCOUNTER — Ambulatory Visit
Admission: RE | Admit: 2019-08-25 | Discharge: 2019-08-25 | Disposition: A | Discharge status: Home / Self Care | Payer: Medicare Other | Source: Ambulatory Visit | Attending: Radiation Oncology | Admitting: Radiation Oncology

## 2019-08-25 ENCOUNTER — Other Ambulatory Visit: Payer: Self-pay | Admitting: *Deleted

## 2019-08-25 ENCOUNTER — Encounter: Payer: Self-pay | Admitting: Licensed Clinical Social Worker

## 2019-08-25 ENCOUNTER — Ambulatory Visit: Payer: Medicare Other | Admitting: *Deleted

## 2019-08-25 DIAGNOSIS — C7951 Secondary malignant neoplasm of bone: Secondary | ICD-10-CM | POA: Diagnosis not present

## 2019-08-25 DIAGNOSIS — Z510 Encounter for antineoplastic radiation therapy: Secondary | ICD-10-CM | POA: Diagnosis not present

## 2019-08-25 DIAGNOSIS — C50411 Malignant neoplasm of upper-outer quadrant of right female breast: Secondary | ICD-10-CM | POA: Diagnosis not present

## 2019-08-25 MED ORDER — HYDROCODONE-ACETAMINOPHEN 5-325 MG PO TABS: 1.0000 | ORAL_TABLET | Freq: Four times a day (QID) | ORAL | 0 refills | Status: DC | PRN

## 2019-08-25 NOTE — Patient Outreach (Signed)
[facility]) Care Management  08/25/2019  Erica Young 07-Mar-1941 [id number]   CSW was only able to make brief contact with patient today, as patient reported that she was on her way out the door to attend her radiation oncology appointment [facility].  Patient remembered having spoken with CSW in the past, encouraging CSW to call her back at another time, but did not indicate when, reporting that she has radiation oncology appointments scheduled 5 days per week, at varying times.  CSW agreed to try and contact patient again tomorrow, Wednesday, August 26, 2019, around 10:00am, as patient's treatment appointment tomorrow is not scheduled until 1:15pm.  Patient voiced understanding and was agreeable to this plan.  Erica Young, BSW, MSW, LCSW  Licensed Education officer, environmental Health System  Mailing [place] [address], [place], [place] [address] Physical Address-[address], [place], [place] [address] Toll Free Main # [phone] Fax # [phone] Cell # [phone]  Office # [phone] [email or web address]

## 2019-08-25 NOTE — Telephone Encounter (Signed)
Ok to pend 30 tabs of hydrocodone.

## 2019-08-25 NOTE — Addendum Note (Signed)
Addended by: Westley Hummer B on: 08/25/2019 10:20 AM   Modules accepted: Orders

## 2019-08-25 NOTE — Telephone Encounter (Signed)
Rx sent 

## 2019-08-25 NOTE — Progress Notes (Signed)
[place] CSW Progress Note  Clinical Education officer, museum received message from Learta Codding, [place], stating that "Mrs. Cullars states "I would like to go to stay somewhere" that will also assist her with transportation to her daily radiation appointments and have someone to assist her manage her pain." She does not feel that her son will be able to help due to his own medical concerns."  Patient currently receives transportation to and from appointments [facility] through the transportation department. [place] service.  Clinical Social Worker contacted patient by phone to discuss other options for in-home assistance or respite care facilities. Patient agreed to referrals to [place] [facility]. CSW made referral to [facility] through [place] portal. Called in referral to Doretha Sou with [facility]. They will contact patient to discuss options.    Edwinna Areola Krishna Dancel , LCSW

## 2019-08-26 ENCOUNTER — Other Ambulatory Visit: Payer: Self-pay

## 2019-08-26 ENCOUNTER — Other Ambulatory Visit: Payer: Self-pay | Admitting: *Deleted

## 2019-08-26 ENCOUNTER — Encounter: Payer: Self-pay | Admitting: *Deleted

## 2019-08-26 ENCOUNTER — Ambulatory Visit
Admission: RE | Admit: 2019-08-26 | Discharge: 2019-08-26 | Disposition: A | Discharge status: Home / Self Care | Payer: Medicare Other | Source: Ambulatory Visit | Attending: Radiation Oncology | Admitting: Radiation Oncology

## 2019-08-26 DIAGNOSIS — C50411 Malignant neoplasm of upper-outer quadrant of right female breast: Secondary | ICD-10-CM | POA: Diagnosis not present

## 2019-08-26 DIAGNOSIS — Z510 Encounter for antineoplastic radiation therapy: Secondary | ICD-10-CM | POA: Diagnosis not present

## 2019-08-26 DIAGNOSIS — C7951 Secondary malignant neoplasm of bone: Secondary | ICD-10-CM | POA: Diagnosis not present

## 2019-08-26 NOTE — Patient Outreach (Signed)
[place] [facility]) Care Management  08/26/2019  Catalea Labrecque April 02, 1941 [id number]   CSW was able to make contact with patient today to perform the phone assessment, as well as assess and assist with social work needs and services.  CSW explained to patient that CSW works with patient's RNCM, also with [place] [facility], Jackelyn Poling.  CSW then explained the reason for the call, indicating that [name] thought that patient would benefit from social work services and resources to assist with arranging in-home care services versus pursuing long-term care placement into an assisted living facility.  Patient remembered having worked with [facility] in the past, prior to the recent death of her husband, in 03/22/2019.  CSW obtained two HIPAA compliant identifiers from patient, which included patient's name and date of birth.  Patient admitted that she definitely needs assistance obtaining in-home care services, not at all interested in pursing placement, of any kind, at this time.  Patient indicated that she is currently receiving palliative radiation treatment for Breast Cancer, with Metastasis to the Spine, making it extremely difficult for her to perform activities of daily living independently, due to excruciating and unmanageable lower back and leg pain.  Patient denies having any local support, through family members or friends, and that her son, [name] is suffering from his own medical problems, unable to provide any type of assistance or support to patient.  CSW is aware that patient is working with Maximino Greenland, Licensed Holiday representative with the [facility].  Patient lives alone and reported receiving home health nursing and social work services through [employer], for palliative care services.  CSW inquired as to whether or not patient would be interested in having CSW assist her with applying for Adult Medicaid, through the [place], for which patient denied.  Patient was very adamant about not wanting to apply for Adult Medicaid, even though CSW explained to her that she may be eligible to receive [employer] Forensic scientist), also through the [place], and/or [employer] ([place]), through the [place].  Otherwise, patient is aware that she will need to pay for in-home care services out-of-pocket.  Patient indicated that Ms. Stoisits is also assisting her with referrals for respite care services, as well as options for in-home care services.  Mrs. Stoisits has already referred patient to [employer], via [employer] portal and [facility], by contacting [employer].  CSW agreed to mail patient a list of in-home care providers and respite care agencies, as well as home health care agencies.  CSW will make arrangements to follow-up with patient again next week, on Thursday, Sep 03, 2019, around 10:00am, not only to follow-up regarding social work services and resources, but also to ensure that patient received the packet of resource information mailed to her home by CSW today.  CSW was able to confirm that patient has the correct contact information for CSW, encouraging patient to contact CSW if additional social work needs arise in the meantime.  [name], BSW, MSW, LCSW  Licensed Education officer, environmental Health System  Mailing [place] [address] [place], [place] [address] Physical Address-[address], [place], [place] [address] Toll Free Main # [phone] Fax # [phone] Cell # [phone]  Office # [phone] [email or web address]

## 2019-08-27 ENCOUNTER — Other Ambulatory Visit: Payer: Self-pay | Admitting: *Deleted

## 2019-08-27 ENCOUNTER — Telehealth: Payer: Self-pay | Admitting: Internal Medicine

## 2019-08-27 ENCOUNTER — Other Ambulatory Visit: Payer: Self-pay

## 2019-08-27 ENCOUNTER — Ambulatory Visit
Admission: RE | Admit: 2019-08-27 | Discharge: 2019-08-27 | Disposition: A | Discharge status: Home / Self Care | Payer: Medicare Other | Source: Ambulatory Visit | Attending: Radiation Oncology | Admitting: Radiation Oncology

## 2019-08-27 ENCOUNTER — Other Ambulatory Visit: Payer: Medicare Other | Admitting: *Deleted

## 2019-08-27 DIAGNOSIS — Z510 Encounter for antineoplastic radiation therapy: Secondary | ICD-10-CM | POA: Diagnosis not present

## 2019-08-27 DIAGNOSIS — C7951 Secondary malignant neoplasm of bone: Secondary | ICD-10-CM | POA: Diagnosis not present

## 2019-08-27 DIAGNOSIS — C50411 Malignant neoplasm of upper-outer quadrant of right female breast: Secondary | ICD-10-CM | POA: Diagnosis not present

## 2019-08-27 DIAGNOSIS — Z515 Encounter for palliative care: Secondary | ICD-10-CM

## 2019-08-27 NOTE — Progress Notes (Signed)
COMMUNITY PALLIATIVE CARE RN NOTE  PATIENT NAME: Erica Young DOB: 1940-11-26 MRN: [id number]  PRIMARY CARE PROVIDER: Isaac Bliss, Rayford Halsted, MD  RESPONSIBLE PARTY:  Acct ID - Guarantor Home Phone Work Phone Relationship Acct Type  [id number] Durwin [phone]  Self P/F     [place], [place], [place] [address]   Covid-19 Pre-screening Negative  PLAN OF CARE and INTERVENTION:  1. ADVANCE CARE PLANNING/GOALS OF CARE: Goal is for patient to remain in her home and complete radiation treatments to her spine. She has a DNR. 2. PATIENT/CAREGIVER EDUCATION: Symptom management, pain management, safe mobility/transfers 3. DISEASE STATUS: Met with patient in her home. Her granddaughter was also present during visit. Upon arrival, patient is sitting on the edge of her bed awake and alert. She remains able to engage in appropriate conversation, but is forgetful at times. She speaks about her increased difficulty standing, ambulating and moving around in general d/t pain in her lower back. She is currently receiving palliative radiation to her spine. She feels that radiation is slowly starting to help ease her pain. She has started taking her Hydrocodone more regularly, as she notices it helps with her pain and she is able to move better. She is taking Miralax every other day to help prevent constipation. Observed her stand up from her bed and ambulate into the dining area using her walker. Her granddaughter stood by to make sure she did not fall. She is often dizzy and gait is unsteady at times. She is walking at a much slower rate. She completes her radiation treatments on 09/03/19. She then will have a visit on 09/04/19 from [place] admission nurse to begin hospice services. She has radiation today at 1:15 pm. When she returns home, she has a meeting with a hired caregiver to discuss care needs and schedule. She had a question regarding a new prescription her son picked up from  the pharmacy for Spironolactone 25 mg tabs, 1/2 tab (12.5 mg) po daily. She was unsure if she was supposed to be taking this medication. Reviewed her medication list and this medication was prescribed on 08/19/19. I cut the pills in half, as requested by patient. She is appreciative. Will continue to monitor.    HISTORY OF PRESENT ILLNESS: This is a 79 yo female who resides at home. 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 Palliative care team continues to follow patient. Patient to be admitted to hospice services on 09/04/19 once radiation is complete.  CODE STATUS: DNR ADVANCED DIRECTIVES: Y MOST FORM: no PPS: 40%   PHYSICAL EXAM:   VITALS: Today's Vitals   08/27/19 1047  BP: 107/72  Pulse: 71  Resp: 19  Temp: 97.9 F (36.6 C)  TempSrc: Temporal  SpO2: 96%  PainSc: 6   PainLoc: Back    LUNGS: clear to auscultation  CARDIAC: Cor RRR EXTREMITIES: No edema SKIN: Exposed skin is dry and intact  NEURO: Alert and oriented x 3, increased generalized weakness, ambulatory w/walker    (Duration of visit and documentation 45 minutes)   Daryl Eastern, RN BSN

## 2019-08-27 NOTE — Telephone Encounter (Signed)
No need to take spironolactone if she hasn't been taking it.

## 2019-08-27 NOTE — Telephone Encounter (Signed)
Pt would like a  Call back from [place]. She found a medication called Spironolactone 25mg  and she has not been taking this medication. She feels that may be why she has been so dizzy, she may need to take the mediation.   Pt informed that she is dizzy right now, tried to transfer nurse triage. Pt declined said she only wants to speak to [place].

## 2019-08-27 NOTE — Telephone Encounter (Signed)
Patient is aware and would like to know if there are medications on her list that may make her dizzy and that she can stop taking? She would also like an updated list mailed to home address.

## 2019-08-27 NOTE — Telephone Encounter (Signed)
BP meds can make her dizzy ONLY if BP is low, which it hasn't been recently. Pain meds can cause dizziness too.

## 2019-08-27 NOTE — Patient Outreach (Signed)
[facility]) Care Management  08/27/2019  Erica Young 01-18-1941 [id number]   Return [facility] outreach to Patient and Erica Young daughter Pain, care services   Erica message was left 08/27/19 0900 from Erica Young's home number by Erica female stating "She isn't doing well at all and she could use your help" Erica Young was audible in the background "She knows my case"  Erica Young is being followed by Erica Young since 02/27/19 since her discharged from [facility] (sn[facility]) Referred by [place] coordinator and for Erica recent 08/19/19 MD referral from Dr Erica Young. 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 On 08/24/19 [facility] outreach she expressed concerns with pain and  "I would like to go some place to stay that would take me to my treatment " She reports she is to have 2 weeks of treatment. She reports her treatment finishes on "Thursday" Sep 03 2019    Messages were sent to her pcp, oncologist, radiation oncologist staff, cancer Young social worker (SW) and referred to [place] after reviewing options for management of her pain and home or facility services with her.   Per Epic notes she has been contacted by the cancer Young SW on 08/25/19 and [facility] SW on 08/26/19. Both have assessed and provided options of care to Erica Young. Per their notes her preference as of those notes are now for home services vs facility services. Each SW continue to outreach to Erica Young  Erica Young was noted to have Erica completed oncology treatment on 08/26/19  [facility] RN CM returns Erica call to Erica Young home number She answers  Patient is able to verify HIPAA ([place]) identifiers, date of birth (DOB) and address [facility] RN CM discussed receiving Erica message and inquires how [facility] RN CM can assist her  [facility] RN CM allows Erica Young time to ventilate her feelings. She discusses her treatments, being alone, "wanting to go  somewhere to stay". She informs [facility] RN CM her granddaughter, Erica Young came in to see her this morning and "is planning to leave for Erica trip" She states she has difficulty ambulating fast enough to answer Erica phone in different room and checking voice messages She states she spoke on 08/26/19 with Erica "nurse last night" and Erica "Education officer, museum". She reports since her grand daughter has arrived she has had her call "My two Kimberlys that I trust" (This [facility] RN CM and Erica "therapist")   [facility] RN CM asked permission to speak with her grand daughter, Erica Young. Erica Young gave [facility] RN CM permission to speak with Erica Young.  Erica Young states she has been visiting Erica Young at times in the mornings  [facility] RN CM reviewed with Erica Young information from the [facility] RN CM 08/24/19 outreach, the cancer Young SW outreach on 08/25/19 and the [facility] SW outreach on 08/26/19. [facility] RN CM answered questions and discussed Erica Young discussing "wanting to go somewhere to stay" but per each SW note she informs them she is wanting home services. [facility] RN CM reviewed the home services offered (with referrals sent or contacted) to include Erica Young, Erica Young. [facility] RN CM discussed with Erica Young that Erica Young reports having difficulty answering and retrieving voice message for her phone. Erica Young informs [facility] RN CM she will assist with checking the phone and message to see if one of the agencies has outreached to try to assist Erica Young. [facility]  SW and [facility] RN CM contact numbers were provided to Erica Young plus the name of the cancer Young SW, Erica Young  Erica Young thanked [facility] RN CM for reviewing this information with her   [facility] RN CM discussed the outreach to Erica Young's pcp, oncologist and radiation oncology this week about Erica Young concerns with pain. [facility] RN CM discussed the importance of seeking emergency services if the pain is not manageable or outreach to the pcp or oncologist for changes in the pain management treatments.    Erica Young voiced  understanding and appreciation  Plans [facility] RN CM will follow up with Erica Young within 4-7 business days  Pt encouraged to return Erica call to [facility] RN CM prn Routed note to MDs  Erica Young CM Care Plan Problem One     Most Recent Value  Care Plan Problem One  Breast cancer with bone mets home care needs  Role Documenting the Problem One  Care Management Telephonic Coordinator  Care Plan for Problem One  Active  [facility] Long Term Goal   over the next 60 days patient will receive knowledge of services and resources for home care of breast cancer with spine mets  [facility] Long Term Goal Start Date  08/24/19  Interventions for Problem One Long Term Goal  returned Erica call to patient allowee her to ventilate her feelings, provided an update to her granddaughter Erica Young about all outreaches, options and offers for home services this week answered questions, encouraged calls to MDs or emergency services for concerns with unmanageable pain left [facility] staff contact numbers  [facility] CM Short Term Goal #1   over the next 30 days patient will be offered [facility] SW if she chooses for grief related to the loss of her husband  [facility] CM Short Term Goal #1 Start Date  03/11/19  [facility] CM Short Term Goal #1 Met Date  03/16/19  [facility] CM Short Term Goal #2   over the next 30 days patient will SW assistance from [facility] or [facility] oncology for personal care services ior facility placement  [facility] CM Short Term Goal #2 Start Date  08/24/19  Interventions for Short Term Goal #2  returned Erica call to patient allowee her to ventilate her feelings, provided an update to her granddaughter Erica Young about all outreaches, options and offers for home services this week answered questions, encouraged calls to MDs or emergency services for concerns with unmanageable pain left [place] [facility] staff contact numbers      Wilfred Siverson L. Lavina Hamman, RN, BSN, [place] Coordinator Office number [phone] Mobile number [phone]  Main [facility] number  [phone] Fax number [phone]

## 2019-08-28 ENCOUNTER — Telehealth: Payer: Self-pay | Admitting: *Deleted

## 2019-08-28 ENCOUNTER — Telehealth: Payer: Self-pay | Admitting: Internal Medicine

## 2019-08-28 ENCOUNTER — Other Ambulatory Visit: Payer: Self-pay | Admitting: *Deleted

## 2019-08-28 ENCOUNTER — Other Ambulatory Visit: Payer: Self-pay

## 2019-08-28 ENCOUNTER — Ambulatory Visit
Admission: RE | Admit: 2019-08-28 | Discharge: 2019-08-28 | Disposition: A | Discharge status: Home / Self Care | Payer: Medicare Other | Source: Ambulatory Visit | Attending: Radiation Oncology | Admitting: Radiation Oncology

## 2019-08-28 DIAGNOSIS — Z510 Encounter for antineoplastic radiation therapy: Secondary | ICD-10-CM | POA: Diagnosis not present

## 2019-08-28 DIAGNOSIS — C50411 Malignant neoplasm of upper-outer quadrant of right female breast: Secondary | ICD-10-CM | POA: Diagnosis not present

## 2019-08-28 DIAGNOSIS — R42 Dizziness and giddiness: Secondary | ICD-10-CM

## 2019-08-28 DIAGNOSIS — C7951 Secondary malignant neoplasm of bone: Secondary | ICD-10-CM | POA: Diagnosis not present

## 2019-08-28 DIAGNOSIS — Z9181 History of falling: Secondary | ICD-10-CM

## 2019-08-28 DIAGNOSIS — C50911 Malignant neoplasm of unspecified site of right female breast: Secondary | ICD-10-CM

## 2019-08-28 NOTE — Telephone Encounter (Signed)
Erica Young from [facility] is requesting a wheelchair for pt, they are in hopes that it will relieve some of her pain.   [id number]:[phone]

## 2019-08-28 NOTE — Telephone Encounter (Signed)
This RN spoke with pt per her call- stating she is having increased weakness, pain and dizziness.  She spoke with her primary MD- who told her to stop some of her medications but she is not noticing improvement.  She feels above is worsening.  Per discussion - advised if she is having uncontrolled pain and weakness including " I cannot walk "- she needs to proceed to the emergency room- ideally at [facility] due to need to complete radiation therapy.

## 2019-08-28 NOTE — Telephone Encounter (Signed)
Patient is aware and medication list mailed to home address

## 2019-08-28 NOTE — Telephone Encounter (Signed)
Ok to order 

## 2019-08-28 NOTE — Patient Outreach (Signed)
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 C50.411,Z17.0 (ICD-10-CM) - Malignant neoplasm of upper-outer quadrant of right breast in female, estrogen receptor positive  [facility] RN CM outreach to Erica Young successfully  Patient is able to verify HIPAA ([place]) identifiers, date of birth (DOB) and address  Erica Young is a breast cancer patient with mets to spine receiving daily palliative radiation with reported unmanaged lower back and right leg pain   [facility] outreach call assessment  Erica Young informs [facility] RN CM she is doing "no good, my pain" She discussed that using her walker in the home causes increase pain Pain level today is a 9. Pain only relieved with rest/lying down and pain medication. Her pain in her back and right leg are ongoing She states she believes her right foot is now being affected. She reports the pain in her back feels "like it is going to break into, Young apart" She states the only thing that relieves the pain at intervals is medication,not walking and resting.. She reports "no pain when sitting" She reports not discussing this pain with providers "They know I am in pain" she was encouraged to speak with providers about her pain in case pain medication need adjustment [facility] RN CM spoke with her about the option of her using a Wheelchair (W/C) at home to decrease some of the ambulation. She reports she thinks that might help and agrees for [facility] RN CM to contact her MD for assistance with orders for a w/c to possibly help with decreasing ambulation that she reports  increases her pain. She is not able to verify the DME company used to get her walker   Plan of care [facility] RN CM inquired about Erica Young goals/plan of care/wishes  [facility] RN CM inquired if she wanted to remain with palliative care  She reports "I don't know"  She confirms she still receives calls from her palliative care RN and "therapist Erica Young"    Care coordination  [facility] CM called Erica Young office Spoke with Erica Young who took a message to give to the medical assistant, Erica Young Request for assistance in getting Erica Young a w/c for home use that may also assist with possible decrease of pain she states ambulating with a walker causes to her back, right leg and right foot    SocialMrs [name] is a 79 year old retired female who lives at home alone with family checking in on her. Her husband RobertStacerpasses on 03/02/19 at [facility] after she was d/c home from [facility] on 02/27/19. She has supportive family and friends. Her son Erica Young is supportive. She is able to complete ADLs and needs assist with iADLs and transportation from her son and family. She does understand and speak english but requests english to be spoken slower so she can understand clearly.    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  Appointments 08/28/19 radiation daily treatment at the cancer center 08/31/19 Erica [facility]a [facility] Oncology 08/31/19 to 09/03/19 radiation daily treatments Erica Young at the cancer center 11/10/19 primary care provider (PCP) Erica Young  Plan [facility]  RN CM will follow up with Erica Young within  the next 7-10 business days to follow up on status of hospice services Pt encouraged to return a call to [facility] RN CM prn Routed note to MD Erica Young CM Care Plan Problem One     Most Recent Value  Care Plan Problem One  Breast cancer with bone mets home care needs  Role Documenting the Problem One  Care Management Telephonic Coordinator  Care Plan for Problem One  Active  [facility] Long Term Goal   over the next 60 days patient will receive knowledge of services and resources for home care of breast cancer with spine mets  [facility] Long Term Goal Start Date  08/24/19  Interventions for Problem One Long Term Goal  asessed for home care status, needs assisted by calling pcp to request a order for w/c to possibly assist with decreasing pain related to ambulation with walker  [facility] CM Short Term Goal #1   over the next 30 days patient will be offered [facility] if she chooses for grief related to the loss of her husband  THN CM Short Term Goal #1 Start Date  03/11/19  [facility] CM Short Term Goal #1 Met Date  03/16/19  [facility]N CM Short Term Goal #2   over the next 30 days patient will SW assistance from [facility] or [facility] oncology for personal care services ior facility placement  [facility] CM Short Term Goal #2 Start Date  08/24/19  Interventions for Short Term Goal #2  asessed for home care status, needs assisted by calling pcp to request a order for w/c to possibly assist with decreasing pain related to ambulation with walker  [facility]N CM Short Term Goal #3  patient will report she has received a wheelchair during outreach to assist  [facility] CM Short Term Goal #3 Start Date  08/28/19  Interventions for Short Tern Goal #3  asessed for home care status, needs assisted by calling pcp to request a order for w/c to possibly assist with decreasing pain related to ambulation with walker      Vernis Eid L. Lavina Hamman, RN, BSN, [place] Coordinator Office number [phone] Mobile number [phone]  Main [facility] number  [phone] Fax number [phone]

## 2019-08-28 NOTE — Telephone Encounter (Signed)
Order placed

## 2019-08-31 ENCOUNTER — Other Ambulatory Visit: Payer: Self-pay

## 2019-08-31 ENCOUNTER — Inpatient Hospital Stay: Payer: Medicare Other | Attending: Adult Health | Admitting: Oncology

## 2019-08-31 ENCOUNTER — Other Ambulatory Visit: Payer: Self-pay | Admitting: Oncology

## 2019-08-31 ENCOUNTER — Ambulatory Visit
Admission: RE | Admit: 2019-08-31 | Discharge: 2019-08-31 | Disposition: A | Discharge status: Home / Self Care | Payer: Medicare Other | Source: Ambulatory Visit | Attending: Radiation Oncology | Admitting: Radiation Oncology

## 2019-08-31 ENCOUNTER — Other Ambulatory Visit: Payer: Self-pay | Admitting: *Deleted

## 2019-08-31 DIAGNOSIS — C787 Secondary malignant neoplasm of liver and intrahepatic bile duct: Secondary | ICD-10-CM | POA: Diagnosis not present

## 2019-08-31 DIAGNOSIS — M8458XA Pathological fracture in neoplastic disease, other specified site, initial encounter for fracture: Secondary | ICD-10-CM | POA: Diagnosis not present

## 2019-08-31 DIAGNOSIS — Z20822 Contact with and (suspected) exposure to covid-19: Secondary | ICD-10-CM | POA: Diagnosis not present

## 2019-08-31 DIAGNOSIS — I5022 Chronic systolic (congestive) heart failure: Secondary | ICD-10-CM | POA: Diagnosis not present

## 2019-08-31 DIAGNOSIS — Z66 Do not resuscitate: Secondary | ICD-10-CM | POA: Diagnosis not present

## 2019-08-31 DIAGNOSIS — C7951 Secondary malignant neoplasm of bone: Secondary | ICD-10-CM | POA: Insufficient documentation

## 2019-08-31 DIAGNOSIS — M545 Low back pain: Secondary | ICD-10-CM | POA: Diagnosis not present

## 2019-08-31 DIAGNOSIS — C50411 Malignant neoplasm of upper-outer quadrant of right female breast: Secondary | ICD-10-CM | POA: Insufficient documentation

## 2019-08-31 DIAGNOSIS — M79661 Pain in right lower leg: Secondary | ICD-10-CM | POA: Diagnosis not present

## 2019-08-31 DIAGNOSIS — Z03818 Encounter for observation for suspected exposure to other biological agents ruled out: Secondary | ICD-10-CM | POA: Diagnosis not present

## 2019-08-31 DIAGNOSIS — Z510 Encounter for antineoplastic radiation therapy: Secondary | ICD-10-CM | POA: Insufficient documentation

## 2019-08-31 MED ORDER — HYDROCODONE-ACETAMINOPHEN 5-325 MG PO TABS: 1.0000 | ORAL_TABLET | Freq: Four times a day (QID) | ORAL | 0 refills | Status: DC | PRN

## 2019-09-01 ENCOUNTER — Other Ambulatory Visit: Payer: Self-pay

## 2019-09-01 ENCOUNTER — Ambulatory Visit
Admission: RE | Admit: 2019-09-01 | Discharge: 2019-09-01 | Disposition: A | Discharge status: Home / Self Care | Payer: Medicare Other | Source: Ambulatory Visit | Attending: Radiation Oncology | Admitting: Radiation Oncology

## 2019-09-01 DIAGNOSIS — C7951 Secondary malignant neoplasm of bone: Secondary | ICD-10-CM | POA: Diagnosis not present

## 2019-09-02 ENCOUNTER — Emergency Department (HOSPITAL_COMMUNITY): Payer: Medicare Other

## 2019-09-02 ENCOUNTER — Encounter (HOSPITAL_COMMUNITY): Payer: Self-pay | Admitting: Emergency Medicine

## 2019-09-02 ENCOUNTER — Inpatient Hospital Stay (HOSPITAL_COMMUNITY)
Admission: EM | Admit: 2019-09-02 | Discharge: 2019-09-13 | DRG: 478 | Disposition: A | Discharge status: Skilled Nursing Facility | Payer: Medicare Other | Attending: Internal Medicine | Admitting: Internal Medicine

## 2019-09-02 ENCOUNTER — Other Ambulatory Visit: Payer: Self-pay

## 2019-09-02 ENCOUNTER — Ambulatory Visit
Admission: RE | Admit: 2019-09-02 | Discharge: 2019-09-02 | Disposition: A | Discharge status: Home / Self Care | Payer: Medicare Other | Source: Ambulatory Visit | Attending: Radiation Oncology | Admitting: Radiation Oncology

## 2019-09-02 DIAGNOSIS — E860 Dehydration: Secondary | ICD-10-CM | POA: Diagnosis present

## 2019-09-02 DIAGNOSIS — M79604 Pain in right leg: Secondary | ICD-10-CM | POA: Diagnosis not present

## 2019-09-02 DIAGNOSIS — Z170 Estrogen receptor positive status [ER+]: Secondary | ICD-10-CM

## 2019-09-02 DIAGNOSIS — Q211 Atrial septal defect: Secondary | ICD-10-CM

## 2019-09-02 DIAGNOSIS — Z66 Do not resuscitate: Secondary | ICD-10-CM | POA: Diagnosis present

## 2019-09-02 DIAGNOSIS — Z8719 Personal history of other diseases of the digestive system: Secondary | ICD-10-CM

## 2019-09-02 DIAGNOSIS — R52 Pain, unspecified: Secondary | ICD-10-CM | POA: Diagnosis not present

## 2019-09-02 DIAGNOSIS — Z9049 Acquired absence of other specified parts of digestive tract: Secondary | ICD-10-CM

## 2019-09-02 DIAGNOSIS — Z86010 Personal history of colonic polyps: Secondary | ICD-10-CM

## 2019-09-02 DIAGNOSIS — R590 Localized enlarged lymph nodes: Secondary | ICD-10-CM | POA: Diagnosis present

## 2019-09-02 DIAGNOSIS — Z20822 Contact with and (suspected) exposure to covid-19: Secondary | ICD-10-CM | POA: Diagnosis present

## 2019-09-02 DIAGNOSIS — Z8249 Family history of ischemic heart disease and other diseases of the circulatory system: Secondary | ICD-10-CM

## 2019-09-02 DIAGNOSIS — R918 Other nonspecific abnormal finding of lung field: Secondary | ICD-10-CM | POA: Diagnosis present

## 2019-09-02 DIAGNOSIS — Z9089 Acquired absence of other organs: Secondary | ICD-10-CM

## 2019-09-02 DIAGNOSIS — C7951 Secondary malignant neoplasm of bone: Secondary | ICD-10-CM | POA: Diagnosis present

## 2019-09-02 DIAGNOSIS — I10 Essential (primary) hypertension: Secondary | ICD-10-CM

## 2019-09-02 DIAGNOSIS — Z9841 Cataract extraction status, right eye: Secondary | ICD-10-CM

## 2019-09-02 DIAGNOSIS — K5900 Constipation, unspecified: Secondary | ICD-10-CM | POA: Diagnosis not present

## 2019-09-02 DIAGNOSIS — Z79891 Long term (current) use of opiate analgesic: Secondary | ICD-10-CM

## 2019-09-02 DIAGNOSIS — E876 Hypokalemia: Secondary | ICD-10-CM | POA: Diagnosis present

## 2019-09-02 DIAGNOSIS — M8458XA Pathological fracture in neoplastic disease, other specified site, initial encounter for fracture: Principal | ICD-10-CM | POA: Diagnosis present

## 2019-09-02 DIAGNOSIS — Z87891 Personal history of nicotine dependence: Secondary | ICD-10-CM

## 2019-09-02 DIAGNOSIS — Z79811 Long term (current) use of aromatase inhibitors: Secondary | ICD-10-CM

## 2019-09-02 DIAGNOSIS — Z03818 Encounter for observation for suspected exposure to other biological agents ruled out: Secondary | ICD-10-CM | POA: Diagnosis not present

## 2019-09-02 DIAGNOSIS — R0902 Hypoxemia: Secondary | ICD-10-CM | POA: Diagnosis not present

## 2019-09-02 DIAGNOSIS — Z881 Allergy status to other antibiotic agents status: Secondary | ICD-10-CM

## 2019-09-02 DIAGNOSIS — F419 Anxiety disorder, unspecified: Secondary | ICD-10-CM | POA: Diagnosis present

## 2019-09-02 DIAGNOSIS — M4850XA Collapsed vertebra, not elsewhere classified, site unspecified, initial encounter for fracture: Secondary | ICD-10-CM | POA: Diagnosis present

## 2019-09-02 DIAGNOSIS — M545 Low back pain: Secondary | ICD-10-CM | POA: Diagnosis not present

## 2019-09-02 DIAGNOSIS — I878 Other specified disorders of veins: Secondary | ICD-10-CM

## 2019-09-02 DIAGNOSIS — G893 Neoplasm related pain (acute) (chronic): Secondary | ICD-10-CM | POA: Diagnosis present

## 2019-09-02 DIAGNOSIS — M79661 Pain in right lower leg: Secondary | ICD-10-CM | POA: Diagnosis not present

## 2019-09-02 DIAGNOSIS — J9811 Atelectasis: Secondary | ICD-10-CM | POA: Diagnosis present

## 2019-09-02 DIAGNOSIS — I110 Hypertensive heart disease with heart failure: Secondary | ICD-10-CM | POA: Diagnosis present

## 2019-09-02 DIAGNOSIS — Z6821 Body mass index (BMI) 21.0-21.9, adult: Secondary | ICD-10-CM

## 2019-09-02 DIAGNOSIS — Z8673 Personal history of transient ischemic attack (TIA), and cerebral infarction without residual deficits: Secondary | ICD-10-CM

## 2019-09-02 DIAGNOSIS — Z923 Personal history of irradiation: Secondary | ICD-10-CM

## 2019-09-02 DIAGNOSIS — Z9842 Cataract extraction status, left eye: Secondary | ICD-10-CM

## 2019-09-02 DIAGNOSIS — C50411 Malignant neoplasm of upper-outer quadrant of right female breast: Secondary | ICD-10-CM | POA: Diagnosis present

## 2019-09-02 DIAGNOSIS — Z79899 Other long term (current) drug therapy: Secondary | ICD-10-CM

## 2019-09-02 DIAGNOSIS — C787 Secondary malignant neoplasm of liver and intrahepatic bile duct: Secondary | ICD-10-CM | POA: Diagnosis present

## 2019-09-02 DIAGNOSIS — C50919 Malignant neoplasm of unspecified site of unspecified female breast: Secondary | ICD-10-CM

## 2019-09-02 DIAGNOSIS — K760 Fatty (change of) liver, not elsewhere classified: Secondary | ICD-10-CM | POA: Diagnosis present

## 2019-09-02 DIAGNOSIS — I5022 Chronic systolic (congestive) heart failure: Secondary | ICD-10-CM | POA: Diagnosis present

## 2019-09-02 DIAGNOSIS — M4846XA Fatigue fracture of vertebra, lumbar region, initial encounter for fracture: Secondary | ICD-10-CM

## 2019-09-02 DIAGNOSIS — S32010A Wedge compression fracture of first lumbar vertebra, initial encounter for closed fracture: Secondary | ICD-10-CM | POA: Diagnosis present

## 2019-09-02 DIAGNOSIS — I700 Atherosclerosis of aorta: Secondary | ICD-10-CM | POA: Diagnosis present

## 2019-09-02 DIAGNOSIS — Z800 Family history of malignant neoplasm of digestive organs: Secondary | ICD-10-CM

## 2019-09-02 DIAGNOSIS — E440 Moderate protein-calorie malnutrition: Secondary | ICD-10-CM | POA: Insufficient documentation

## 2019-09-02 DIAGNOSIS — Z7982 Long term (current) use of aspirin: Secondary | ICD-10-CM

## 2019-09-02 DIAGNOSIS — Z9851 Tubal ligation status: Secondary | ICD-10-CM

## 2019-09-02 MED ORDER — DEXAMETHASONE SODIUM PHOSPHATE 10 MG/ML IJ SOLN
10.0000 mg | Freq: Once | INTRAMUSCULAR | Status: AC
  Administered 2019-09-02: 10 mg via INTRAVENOUS
  Filled 2019-09-02: qty 1

## 2019-09-02 NOTE — ED Notes (Addendum)
Xray at bedside. Patient stating she does not want xrays done as she had them done recently.

## 2019-09-02 NOTE — Progress Notes (Signed)
[place]  Telephone:([phone]) [phone] Fax:(336) [phone]   Stolarz did not show for her visit 09/03/2019  ID: [name] DOB: [birth year]  MR#: [id number]  GSU#:[id number]  Patient Care Team: Isaac Bliss, Rayford Halsted, MD as PCP - General (Internal Medicine) Debara Pickett Nadean Corwin, MD as PCP - Cardiology (Cardiology) Tanda Rockers, MD as Consulting Physician (Pulmonary Disease) Ky Rumple, Virgie Dad, MD as Consulting Physician (Oncology) Armbruster, Carlota Raspberry, MD as Consulting Physician (Gastroenterology) Bobbitt, Sedalia Muta, MD as Consulting Physician (Allergy and Immunology) Barbaraann Faster, RN as [place] Management Rozetta Nunnery, MD as Consulting Physician (Otolaryngology) Gery Pray, MD as Consulting Physician (Radiation Oncology) Saporito, Maree Erie, LCSW as [place] Management OTHER MD:   CHIEF COMPLAINT: 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   She was prescribed exemestane at her last visit on 07/27/2019.  It is not clear whether she has started this.  Since her last visit, she met with Dr. Sondra Come on 08/06/2019 to discuss radiation therapy to her lumbar spine. She agreed to palliative radiation therapy to her spine and continues to decline radiation to her sinus mass. She began treatment on 08/17/2019 and is scheduled to finish tomorrow, 09/04/2019.   REVIEW OF SYSTEMS: Marlena     BREAST CANCER HISTORY: From the earlier summary note  Enza tells me in 2007 while living in [place] she was found to have a very small cancer in the upper-outer quadrant of the right breast, about the size of the P, noted on mammography. It was not palpable. She had a right lumpectomy and full sentinel lymph node sampling. She then received  adjuvant radiation, to a total of 33 treatments. She received no systemic therapy.  She then did well until December 2014, when she had a fall and complain of pain in her left ribs. Rib films and chest x-ray on 04/13/2013 showed no fractures, but CT of the abdomen and pelvis on the same day found subcarinal and right hilar adenopathy. This was followed up with a chest CT scan 05/05/2013 confirming extensive mediastinal and right hilar lymphadenopathy, with multiple pulmonary nodules, the largest measuring 0.9 cm. PET scan 05/21/2013 showed hypermetabolic adenopathy in the right and left paratracheal areas, the precarinal, subcarinal and right hilar areas, but no involvement of the liver, and the lung nodules were not hypermetabolic (although they were below the level of reliable detection). In addition, at L5 there was a lucency measuring 1.2 cm.  The PET scan also showed a hypermetabolic focus on the thyroid gland, which was evaluated with neck ultrasound and biopsy 85/92/9244, showing a follicular lesion of undetermined significance (([id number]).  The patient was referred to pulmonary [Dr Melvyn Novas and Dr Julien Nordmann for further evaluation. Repeat PET scan 12/28/2013 showed, in addition to the adenopathy previously noted, now multiple bony metastases. On 01/07/2014 the patient underwent bronchoscopy and this showed (SZA [id number], together with separate cytology] a low-grade mucinous invasive breast cancer, estrogen receptor 100% positive, progesterone receptor 14% positive, with no HER-2 amplification, the signals ratio being 1.30 and the number per cell 1.95.  The patient was started on anastrozole 01/22/2014; monthly denosumab/Xgeva was added 07/30/2014. She appeared to tolerate this well, and her CA-27-29 (127 at baseline) normalized. Most recent CT scans of chest abdomen and pelvis 10/03/2015 showed continuing response. Despite this good  news however, the patient decided to go off anastrozole and  denosumab/Xgeva as of June 2017. By the time she saw Dr. Earlie Server again in September 2017 her tumor marker had doubled. At that time the patient was referred to the breast clinic for a second opinion regarding further evaluation and treatment.   PAST MEDICAL HISTORY: Past Medical History:  Diagnosis Date  . Allergy   . Anxiety   . Bone cancer ([place]) mri 11/11/14   right parietal bone,left cribriform plate metastases  . Cancer [facility]) 2007   right breat ca  , lumpectomy and radiation tx (declined chemo and additional prophylactic meds)  . Cataract    both eyes  10/2016 Lt.     04/2017 Rt.   . CEREBROVASCULAR DISEASE 03/03/2010  . Diverticulitis   . DIVERTICULOSIS, COLON 03/03/2010  . Fatty liver 08/02/09   as per U/S done by [facility]  . Foramen ovale    positive bubble study  . GERD (gastroesophageal reflux disease)   . Headache(784.0)    she thinks sinus headaches  . History of cerebral artery stenosis    right middle  . HYPERLIPIDEMIA 03/03/2010  . Hypertension   . HYPOTHYROIDISM 03/03/2010   no longer on meds  . Internal hemorrhoid   . Low back pain   . Mass of sinus   . Mastoiditis    noted on brain MRI  . Rib fracture   . Right leg pain   . Stroke [facility]) Oct. 2011   TIA  . Ulcer     PAST SURGICAL HISTORY: Past Surgical History:  Procedure Laterality Date  . BREAST LUMPECTOMY     right  . CATARACT EXTRACTION Left    10/2016   Rt 04/2017  . CHOLECYSTECTOMY    . COLONOSCOPY    . POLYPECTOMY     benign cecum  . SHOULDER SURGERY     right shoulder (dislocation)  . TONSILLECTOMY    . TUBAL LIGATION    . UPPER GASTROINTESTINAL ENDOSCOPY    . VIDEO BRONCHOSCOPY WITH ENDOBRONCHIAL ULTRASOUND N/A 01/07/2014   Procedure: VIDEO BRONCHOSCOPY WITH ENDOBRONCHIAL ULTRASOUND;  Surgeon: Ivin Poot, MD;  [facility]: [facility] OR;  Service: Thoracic;  Laterality: N/A;    FAMILY HISTORY Family History  Problem Relation Age of Onset  . Heart disease Sister        cerebral  vascular disease also  . Heart disease Brother        cerebral vascular disease also  . Heart disease Brother        cerebral vascular disease  . Heart disease Sister        cebreal vascular disease also  . Heart disease Sister        cebreal vascular diease also  . Heart disease Sister        cerebral vascular diease also  . Stomach cancer Father   . Colon cancer Neg Hx   . Esophageal cancer Neg Hx   . Rectal cancer Neg Hx   . Colon polyps Neg Hx   . Asthma Neg Hx   The patient's father died from stomach cancer in his late 57s. The patient's mother died in her 68s from a stroke. The patient has 2 brothers, 4 sisters. One sister had leukemia. There is no history of breast, colon, or ovarian cancer in the family to her knowledge   GYNECOLOGIC HISTORY:  No LMP recorded. Patient is postmenopausal. Menarche age 68, first live birth age 42, the patient is [place]  P2. She went through menopause in her late 55s. She took no hormone replacement. She took oral contraceptives for approximately 2 years remotely without complications.   SOCIAL HISTORY: (updated 03/2019) Quinlivan is originally from [place], [place]. She worked as a Education officer, museum, particularly, in the field of substance abuse. Her husband, Mortimer Fries, was a retired substance abuse Social worker. He unfortunately passed away in late October/early November 2020. Waren has 2 children from her first marriage, [name], who lives in [place], and worked as a Audiological scientist but is now disabled, and Lawrence Santiago, who lives in [place] and works in Engineer, mining. Mortimer Fries has 3 children from his first marriage, Tanna Furry, [place], and [place]. [name] Junior lives in [place] and has his own trucking business. Mikki Santee tells me he is estranged from his 2 daughters Amedeo Gory (a retired Pharmacist, hospital) and Salena Saner (who works in a bank). They are living on [place].The patient has 8 grandchildren. Mancebo attends [employer].   ADVANCED DIRECTIVES:     HEALTH MAINTENANCE: Social History   Tobacco Use  . Smoking status: Former Smoker    Packs/day: 0.50    Years: 20.00    Pack years: 10.00    Types: Cigarettes    Start date: 63    Quit date: 05/01/1991    Years since quitting: 28.3  . Smokeless tobacco: Never Used  Substance Use Topics  . Alcohol use: No    Alcohol/week: 0.0 standard drinks  . Drug use: No     Colonoscopy:  PAP:  Bone density:   Allergies  Allergen Reactions  . Ciprofloxacin Anaphylaxis    Current Outpatient Medications  Medication Sig Dispense Refill  . ALPRAZolam (XANAX) 0.25 MG tablet Take 1 tablet (0.25 mg total) by mouth 3 (three) times daily as needed. for anxiety 90 tablet 2  . aluminum-magnesium hydroxide 200-200 MG/5ML suspension Take by mouth every 6 (six) hours as needed for indigestion.    Marland Kitchen aspirin EC 81 MG tablet Take 81 mg by mouth daily after breakfast.     . carvedilol (COREG) 12.5 MG tablet Take 1 tablet (12.5 mg total) by mouth 2 (two) times daily with a meal. 180 tablet 1  . Cholecalciferol (VITAMIN D PO) Take 1,000 Units by mouth daily.     Marland Kitchen docusate sodium (COLACE) 100 MG capsule Take 100 mg by mouth daily.    Marland Kitchen exemestane (AROMASIN) 25 MG tablet Take 1 tablet (25 mg total) by mouth daily after breakfast. 60 tablet 6  . Glucosamine 500 MG CAPS Take 500 mg by mouth in the morning and at bedtime.     Marland Kitchen HYDROcodone-acetaminophen (NORCO/VICODIN) 5-325 MG tablet Take 1 tablet by mouth every 6 (six) hours as needed. 30 tablet 0  . loratadine (CLARITIN) 10 MG tablet Take 1 tablet (10 mg total) by mouth daily. 30 tablet 11  . losartan (COZAAR) 25 MG tablet Take 0.5 tablets (12.5 mg total) by mouth daily. 30 tablet 2  . meclizine (ANTIVERT) 25 MG tablet Take 1 tablet (25 mg total) by mouth 3 (three) times daily as needed for dizziness. (Patient not taking: Reported on 08/18/2019) 15 tablet 0  . ondansetron (ZOFRAN-ODT) 4 MG disintegrating tablet Take 1 tablet (4 mg total) by mouth every 8  (eight) hours as needed for nausea or vomiting. 8 tablet 0   No current facility-administered medications for this visit.    OBJECTIVE:  Latin American woman   There were no vitals filed for this visit.   There is  no height or weight on file to calculate BMI.        LAB RESULTS:  CMP     Component Value Date/Time   NA 129 (L) 08/18/2019 1029   NA 138 04/18/2017 0853   K 4.3 08/18/2019 1029   K 4.4 04/18/2017 0853   CL 94 (L) 08/18/2019 1029   CO2 25 08/18/2019 1029   CO2 29 04/18/2017 0853   GLUCOSE 128 (H) 08/18/2019 1029   GLUCOSE 95 04/18/2017 0853   BUN 11 08/18/2019 1029   BUN 16.6 04/18/2017 0853   CREATININE 0.69 08/18/2019 1029   CREATININE 0.77 01/29/2019 0855   CREATININE 0.8 04/18/2017 0853   CALCIUM 9.1 08/18/2019 1029   CALCIUM 9.2 04/18/2017 0853   PROT 7.0 06/24/2019 0808   PROT 7.0 04/18/2017 0853   ALBUMIN 3.8 06/24/2019 0808   ALBUMIN 3.8 04/18/2017 0853   AST 17 06/24/2019 0808   AST 16 01/29/2019 0855   AST 17 04/18/2017 0853   ALT 13 06/24/2019 0808   ALT 13 01/29/2019 0855   ALT 16 04/18/2017 0853   ALKPHOS 114 06/24/2019 0808   ALKPHOS 90 04/18/2017 0853   BILITOT 0.5 06/24/2019 0808   BILITOT 0.3 01/29/2019 0855   BILITOT 0.63 04/18/2017 0853   GFRNONAA >60 08/18/2019 1029   GFRNONAA >60 01/29/2019 0855   GFRAA >60 08/18/2019 1029   GFRAA >60 01/29/2019 0855    INo results found for: SPEP, UPEP  Lab Results  Component Value Date   WBC 5.1 08/18/2019   NEUTROABS 4.2 08/11/2019   HGB 13.4 08/18/2019   HCT 39.8 08/18/2019   MCV 94.8 08/18/2019   PLT 245 08/18/2019      Chemistry      Component Value Date/Time   NA 129 (L) 08/18/2019 1029   NA 138 04/18/2017 0853   K 4.3 08/18/2019 1029   K 4.4 04/18/2017 0853   CL 94 (L) 08/18/2019 1029   CO2 25 08/18/2019 1029   CO2 29 04/18/2017 0853   BUN 11 08/18/2019 1029   BUN 16.6 04/18/2017 0853   CREATININE 0.69 08/18/2019 1029   CREATININE 0.77 01/29/2019 0855   CREATININE  0.8 04/18/2017 0853      Component Value Date/Time   CALCIUM 9.1 08/18/2019 1029   CALCIUM 9.2 04/18/2017 0853   ALKPHOS 114 06/24/2019 0808   ALKPHOS 90 04/18/2017 0853   AST 17 06/24/2019 0808   AST 16 01/29/2019 0855   AST 17 04/18/2017 0853   ALT 13 06/24/2019 0808   ALT 13 01/29/2019 0855   ALT 16 04/18/2017 0853   BILITOT 0.5 06/24/2019 0808   BILITOT 0.3 01/29/2019 0855   BILITOT 0.63 04/18/2017 0853      No components found for: [id number]  No results for input(s): INR in the last 168 hours.  Urinalysis    Component Value Date/Time   COLORURINE STRAW (A) 11/19/2016 1750   APPEARANCEUR CLEAR 11/19/2016 1750   LABSPEC 1.003 (L) 11/19/2016 1750   PHURINE 7.0 11/19/2016 1750   GLUCOSEU NEGATIVE 11/19/2016 1750   HGBUR NEGATIVE 11/19/2016 1750   BILIRUBINUR NEGATIVE 11/19/2016 1750   BILIRUBINUR neg 02/16/2015 1318   KETONESUR NEGATIVE 11/19/2016 1750   PROTEINUR NEGATIVE 11/19/2016 1750   UROBILINOGEN 0.2 02/16/2015 1318   UROBILINOGEN 0.2 11/18/2013 1321   NITRITE NEGATIVE 11/19/2016 1750   LEUKOCYTESUR TRACE (A) 11/19/2016 1750    STUDIES: No results found.   ELIGIBLE FOR AVAILABLE RESEARCH PROTOCOL: no  ASSESSMENT: 79 y.o. [place] woman  (1)  status post right breast upper outer quadrant lumpectomy and axillary lymph node dissection in 2007 for what appears to have been a T1 N0, stage IA invasive ductal carcinoma, treated adjuvantly with radiation (33 sessions)  METASTATIC DISEASE definitively documented Sept 2015 (2) bronchoscopic biopsy 01/07/2014 showed a low-grade mucinous breast cancer, strongly estrogen receptor positive, progesterone receptor positive, HER-2 negative; staging studies confirmed extensive hypermetabolic adenopathy, multiple bone lesions, likely early lung involvement, but no liver lesions.  (a) bone scan and chest CT 07/09/2016 shows stable disease.  (b) CA-27-29 is moderately informative  (c) bone scan and CT scan of the chest  04/24/2017 shows essentially stable disease  (3) on anastrozole between September 2015 and June 2017, with evidence of response; discontinued secondary to side effects  (a) bone density 04/10/2016 shows osteopenia with a T score of -2.1  (4) on monthly denosumab/Xgeva between 07/30/2014 and 10/04/2015, discontinued due to patient's concerns regarding osteonecrosis of the jaw  (a) discussed again October 2017, the patient adamantly refusing denosumab  (5) started fulvestrant 01/26/2016, stopped after 01/29/2019 dose at patient's request   (a) Palbociclib added at 75 mg M/W/F, beginning mid February 2018, also stopped OCT 2020  (b) chest CT scan obtained 12/02/2017 shows small but measurable growth of lung lesions; bones are stable  (c) palbociclib dose increased to 75 mg daily 21/7 beginning 12/23/2017  (d) palbociclib and fulvestrant held after 01/29/2019 fulvestrant dose, which was associated with syncope.  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  It is not clear if she had refilled it.  We can certainly try letrozole which is less expensive.  However she did not show for her appointment 09/03/2019.  We will try to reschedule this for her.   Virgie Dad. Lulubelle Simcoe, MD  09/02/19 8:09 PM Medical Oncology and Hematology [facility] [place], [place] [address] Tel. [phone]    Fax. [phone]   I, Wilburn Mylar, am acting as scribe for Dr. Virgie Dad. Coltan Spinello.  I, Lurline Del MD, have reviewed the above documentation for accuracy and completeness, and I agree with the above.   *Total Encounter Time as defined by the  Centers for Medicare and Medicaid Services includes, in addition to the face-to-face time of a patient visit (documented in the note above) non-face-to-face time: obtaining and reviewing outside history, ordering and reviewing medications, tests or procedures, care coordination (communications with other health care professionals or caregivers) and documentation in the medical record.

## 2019-09-02 NOTE — ED Triage Notes (Signed)
Patient is form home and present with low back pain radiating into the right leg. Patient had radiation treatment about 24 hrs ago. After chemo treatments she tends to have an increase in pain. Pain is due to a tumor. When EMS arrived the patient was no longer ambulatory. Pain medication has not been helping. Patient would also like a social work consult.    EMS vitals: 168/80 BP 98 HR 92% O2 sat on room air  97.7 Temp 18 Resp Rate

## 2019-09-03 ENCOUNTER — Other Ambulatory Visit: Payer: Self-pay | Admitting: *Deleted

## 2019-09-03 ENCOUNTER — Emergency Department (HOSPITAL_COMMUNITY): Payer: Medicare Other

## 2019-09-03 ENCOUNTER — Ambulatory Visit: Payer: Medicare Other

## 2019-09-03 ENCOUNTER — Ambulatory Visit
Admission: RE | Admit: 2019-09-03 | Discharge: 2019-09-03 | Disposition: A | Discharge status: Home / Self Care | Payer: Medicare Other | Source: Ambulatory Visit | Attending: Radiation Oncology | Admitting: Radiation Oncology

## 2019-09-03 ENCOUNTER — Encounter (HOSPITAL_COMMUNITY): Payer: Self-pay

## 2019-09-03 ENCOUNTER — Encounter: Payer: Self-pay | Admitting: *Deleted

## 2019-09-03 ENCOUNTER — Inpatient Hospital Stay (HOSPITAL_BASED_OUTPATIENT_CLINIC_OR_DEPARTMENT_OTHER): Payer: Medicare Other | Admitting: Oncology

## 2019-09-03 DIAGNOSIS — R2689 Other abnormalities of gait and mobility: Secondary | ICD-10-CM | POA: Diagnosis not present

## 2019-09-03 DIAGNOSIS — F419 Anxiety disorder, unspecified: Secondary | ICD-10-CM | POA: Diagnosis present

## 2019-09-03 DIAGNOSIS — K5900 Constipation, unspecified: Secondary | ICD-10-CM | POA: Diagnosis not present

## 2019-09-03 DIAGNOSIS — C50919 Malignant neoplasm of unspecified site of unspecified female breast: Secondary | ICD-10-CM | POA: Diagnosis not present

## 2019-09-03 DIAGNOSIS — R0902 Hypoxemia: Secondary | ICD-10-CM | POA: Diagnosis present

## 2019-09-03 DIAGNOSIS — M4850XA Collapsed vertebra, not elsewhere classified, site unspecified, initial encounter for fracture: Secondary | ICD-10-CM | POA: Diagnosis present

## 2019-09-03 DIAGNOSIS — Z20822 Contact with and (suspected) exposure to covid-19: Secondary | ICD-10-CM | POA: Diagnosis present

## 2019-09-03 DIAGNOSIS — M4846XA Fatigue fracture of vertebra, lumbar region, initial encounter for fracture: Secondary | ICD-10-CM | POA: Diagnosis not present

## 2019-09-03 DIAGNOSIS — C7951 Secondary malignant neoplasm of bone: Secondary | ICD-10-CM

## 2019-09-03 DIAGNOSIS — R918 Other nonspecific abnormal finding of lung field: Secondary | ICD-10-CM | POA: Diagnosis present

## 2019-09-03 DIAGNOSIS — C801 Malignant (primary) neoplasm, unspecified: Secondary | ICD-10-CM | POA: Diagnosis not present

## 2019-09-03 DIAGNOSIS — Q211 Atrial septal defect: Secondary | ICD-10-CM | POA: Diagnosis not present

## 2019-09-03 DIAGNOSIS — C787 Secondary malignant neoplasm of liver and intrahepatic bile duct: Secondary | ICD-10-CM

## 2019-09-03 DIAGNOSIS — Z170 Estrogen receptor positive status [ER+]: Secondary | ICD-10-CM

## 2019-09-03 DIAGNOSIS — Z7401 Bed confinement status: Secondary | ICD-10-CM | POA: Diagnosis not present

## 2019-09-03 DIAGNOSIS — R590 Localized enlarged lymph nodes: Secondary | ICD-10-CM | POA: Diagnosis present

## 2019-09-03 DIAGNOSIS — M6281 Muscle weakness (generalized): Secondary | ICD-10-CM | POA: Diagnosis not present

## 2019-09-03 DIAGNOSIS — S32010A Wedge compression fracture of first lumbar vertebra, initial encounter for closed fracture: Secondary | ICD-10-CM | POA: Diagnosis present

## 2019-09-03 DIAGNOSIS — I69928 Other speech and language deficits following unspecified cerebrovascular disease: Secondary | ICD-10-CM | POA: Diagnosis not present

## 2019-09-03 DIAGNOSIS — M545 Low back pain: Secondary | ICD-10-CM | POA: Diagnosis not present

## 2019-09-03 DIAGNOSIS — I10 Essential (primary) hypertension: Secondary | ICD-10-CM

## 2019-09-03 DIAGNOSIS — E876 Hypokalemia: Secondary | ICD-10-CM | POA: Diagnosis present

## 2019-09-03 DIAGNOSIS — Z923 Personal history of irradiation: Secondary | ICD-10-CM | POA: Diagnosis not present

## 2019-09-03 DIAGNOSIS — C50411 Malignant neoplasm of upper-outer quadrant of right female breast: Secondary | ICD-10-CM | POA: Diagnosis present

## 2019-09-03 DIAGNOSIS — I700 Atherosclerosis of aorta: Secondary | ICD-10-CM | POA: Diagnosis present

## 2019-09-03 DIAGNOSIS — E860 Dehydration: Secondary | ICD-10-CM | POA: Diagnosis present

## 2019-09-03 DIAGNOSIS — M8458XA Pathological fracture in neoplastic disease, other specified site, initial encounter for fracture: Secondary | ICD-10-CM | POA: Diagnosis present

## 2019-09-03 DIAGNOSIS — R41841 Cognitive communication deficit: Secondary | ICD-10-CM | POA: Diagnosis not present

## 2019-09-03 DIAGNOSIS — E440 Moderate protein-calorie malnutrition: Secondary | ICD-10-CM | POA: Diagnosis present

## 2019-09-03 DIAGNOSIS — G893 Neoplasm related pain (acute) (chronic): Secondary | ICD-10-CM | POA: Diagnosis present

## 2019-09-03 DIAGNOSIS — K760 Fatty (change of) liver, not elsewhere classified: Secondary | ICD-10-CM | POA: Diagnosis present

## 2019-09-03 DIAGNOSIS — I5022 Chronic systolic (congestive) heart failure: Secondary | ICD-10-CM | POA: Diagnosis present

## 2019-09-03 DIAGNOSIS — S32010S Wedge compression fracture of first lumbar vertebra, sequela: Secondary | ICD-10-CM | POA: Diagnosis not present

## 2019-09-03 DIAGNOSIS — Z66 Do not resuscitate: Secondary | ICD-10-CM | POA: Diagnosis present

## 2019-09-03 DIAGNOSIS — Z6821 Body mass index (BMI) 21.0-21.9, adult: Secondary | ICD-10-CM | POA: Diagnosis not present

## 2019-09-03 DIAGNOSIS — S32010G Wedge compression fracture of first lumbar vertebra, subsequent encounter for fracture with delayed healing: Secondary | ICD-10-CM | POA: Diagnosis not present

## 2019-09-03 DIAGNOSIS — M4846XS Fatigue fracture of vertebra, lumbar region, sequela of fracture: Secondary | ICD-10-CM | POA: Diagnosis not present

## 2019-09-03 DIAGNOSIS — R5381 Other malaise: Secondary | ICD-10-CM | POA: Diagnosis not present

## 2019-09-03 DIAGNOSIS — M2550 Pain in unspecified joint: Secondary | ICD-10-CM | POA: Diagnosis not present

## 2019-09-03 DIAGNOSIS — I110 Hypertensive heart disease with heart failure: Secondary | ICD-10-CM | POA: Diagnosis present

## 2019-09-03 DIAGNOSIS — J9811 Atelectasis: Secondary | ICD-10-CM | POA: Diagnosis present

## 2019-09-03 DIAGNOSIS — R2681 Unsteadiness on feet: Secondary | ICD-10-CM | POA: Diagnosis not present

## 2019-09-03 LAB — CBC WITH DIFFERENTIAL/PLATELET
Abs Immature Granulocytes: 0.01 10*3/uL (ref 0.00–0.07)
Basophils Absolute: 0 10*3/uL (ref 0.0–0.1)
Basophils Relative: 1 %
Eosinophils Absolute: 0.5 10*3/uL (ref 0.0–0.5)
Eosinophils Relative: 12 %
HCT: 35.6 % — ABNORMAL LOW (ref 36.0–46.0)
Hemoglobin: 11.7 g/dL — ABNORMAL LOW (ref 12.0–15.0)
Immature Granulocytes: 0 %
Lymphocytes Relative: 11 %
Lymphs Abs: 0.4 10*3/uL — ABNORMAL LOW (ref 0.7–4.0)
MCH: 31.4 pg (ref 26.0–34.0)
MCHC: 32.9 g/dL (ref 30.0–36.0)
MCV: 95.4 fL (ref 80.0–100.0)
Monocytes Absolute: 0.4 10*3/uL (ref 0.1–1.0)
Monocytes Relative: 10 %
Neutro Abs: 2.8 10*3/uL (ref 1.7–7.7)
Neutrophils Relative %: 66 %
Platelets: 160 10*3/uL (ref 150–400)
RBC: 3.73 MIL/uL — ABNORMAL LOW (ref 3.87–5.11)
RDW: 14 % (ref 11.5–15.5)
WBC: 4.1 10*3/uL (ref 4.0–10.5)
nRBC: 0 % (ref 0.0–0.2)

## 2019-09-03 LAB — URINALYSIS, ROUTINE W REFLEX MICROSCOPIC
Bilirubin Urine: NEGATIVE
Glucose, UA: NEGATIVE mg/dL
Ketones, ur: 5 mg/dL — AB
Leukocytes,Ua: NEGATIVE
Nitrite: NEGATIVE
Protein, ur: NEGATIVE mg/dL
Specific Gravity, Urine: 1.012 (ref 1.005–1.030)
pH: 6 (ref 5.0–8.0)

## 2019-09-03 LAB — I-STAT CHEM 8, ED
BUN: 8 mg/dL (ref 8–23)
Calcium, Ion: 1.15 mmol/L (ref 1.15–1.40)
Chloride: 97 mmol/L — ABNORMAL LOW (ref 98–111)
Creatinine, Ser: 0.4 mg/dL — ABNORMAL LOW (ref 0.44–1.00)
Glucose, Bld: 108 mg/dL — ABNORMAL HIGH (ref 70–99)
HCT: 35 % — ABNORMAL LOW (ref 36.0–46.0)
Hemoglobin: 11.9 g/dL — ABNORMAL LOW (ref 12.0–15.0)
Potassium: 3.4 mmol/L — ABNORMAL LOW (ref 3.5–5.1)
Sodium: 135 mmol/L (ref 135–145)
TCO2: 26 mmol/L (ref 22–32)

## 2019-09-03 LAB — RESPIRATORY PANEL BY RT PCR (FLU A&B, COVID)
Influenza A by PCR: NEGATIVE
Influenza B by PCR: NEGATIVE
SARS Coronavirus 2 by RT PCR: NEGATIVE

## 2019-09-03 LAB — MAGNESIUM: Magnesium: 1.9 mg/dL (ref 1.7–2.4)

## 2019-09-03 MED ORDER — DOCUSATE SODIUM 100 MG PO CAPS
100.0000 mg | ORAL_CAPSULE | Freq: Every day | ORAL | Status: DC
  Administered 2019-09-04 – 2019-09-07 (×3): 100 mg via ORAL
  Filled 2019-09-03 (×5): qty 1

## 2019-09-03 MED ORDER — PANTOPRAZOLE SODIUM 40 MG PO TBEC
40.0000 mg | DELAYED_RELEASE_TABLET | Freq: Every day | ORAL | Status: DC
  Administered 2019-09-03 – 2019-09-13 (×9): 40 mg via ORAL
  Filled 2019-09-03 (×11): qty 1

## 2019-09-03 MED ORDER — DEXTROSE IN LACTATED RINGERS 5 % IV SOLN
INTRAVENOUS | Status: DC
  Filled 2019-09-03: qty 1000

## 2019-09-03 MED ORDER — ENSURE ENLIVE PO LIQD
237.0000 mL | Freq: Two times a day (BID) | ORAL | Status: DC
  Administered 2019-09-04: 237 mL via ORAL

## 2019-09-03 MED ORDER — VITAMIN D 25 MCG (1000 UNIT) PO TABS
1000.0000 [IU] | ORAL_TABLET | Freq: Every day | ORAL | Status: DC
  Administered 2019-09-03 – 2019-09-13 (×10): 1000 [IU] via ORAL
  Filled 2019-09-03 (×10): qty 1

## 2019-09-03 MED ORDER — ACETAMINOPHEN 650 MG RE SUPP: 650.0000 mg | Freq: Four times a day (QID) | RECTAL | Status: DC | PRN

## 2019-09-03 MED ORDER — LORATADINE 10 MG PO TABS
10.0000 mg | ORAL_TABLET | Freq: Every day | ORAL | Status: DC
  Administered 2019-09-03 – 2019-09-12 (×10): 10 mg via ORAL
  Filled 2019-09-03 (×10): qty 1

## 2019-09-03 MED ORDER — ACETAMINOPHEN 325 MG PO TABS
650.0000 mg | ORAL_TABLET | Freq: Four times a day (QID) | ORAL | Status: DC | PRN
  Filled 2019-09-03: qty 2

## 2019-09-03 MED ORDER — CARVEDILOL 12.5 MG PO TABS
12.5000 mg | ORAL_TABLET | Freq: Two times a day (BID) | ORAL | Status: DC
  Administered 2019-09-03 – 2019-09-10 (×14): 12.5 mg via ORAL
  Filled 2019-09-03 (×15): qty 1

## 2019-09-03 MED ORDER — KETOROLAC TROMETHAMINE 15 MG/ML IJ SOLN
15.0000 mg | Freq: Four times a day (QID) | INTRAMUSCULAR | Status: DC
  Administered 2019-09-03 (×2): 15 mg via INTRAVENOUS
  Filled 2019-09-03 (×2): qty 1

## 2019-09-03 MED ORDER — HYDROCODONE-ACETAMINOPHEN 5-325 MG PO TABS: 1.0000 | ORAL_TABLET | ORAL | Status: DC | PRN

## 2019-09-03 MED ORDER — ENOXAPARIN SODIUM 40 MG/0.4ML ~~LOC~~ SOLN
40.0000 mg | SUBCUTANEOUS | Status: DC
  Administered 2019-09-03 – 2019-09-10 (×8): 40 mg via SUBCUTANEOUS
  Filled 2019-09-03 (×8): qty 0.4

## 2019-09-03 MED ORDER — KETOROLAC TROMETHAMINE 30 MG/ML IJ SOLN
30.0000 mg | Freq: Once | INTRAMUSCULAR | Status: AC
  Administered 2019-09-03: 30 mg via INTRAVENOUS
  Filled 2019-09-03: qty 1

## 2019-09-03 MED ORDER — MORPHINE SULFATE (PF) 2 MG/ML IV SOLN
2.0000 mg | INTRAVENOUS | Status: DC | PRN
  Administered 2019-09-04 – 2019-09-12 (×5): 2 mg via INTRAVENOUS
  Filled 2019-09-03 (×5): qty 1

## 2019-09-03 MED ORDER — IBUPROFEN 400 MG PO TABS
400.0000 mg | ORAL_TABLET | Freq: Three times a day (TID) | ORAL | Status: DC
  Administered 2019-09-03 – 2019-09-12 (×20): 400 mg via ORAL
  Filled 2019-09-03 (×22): qty 1

## 2019-09-03 MED ORDER — ACETAMINOPHEN 325 MG PO TABS
650.0000 mg | ORAL_TABLET | Freq: Four times a day (QID) | ORAL | Status: DC
  Administered 2019-09-03 – 2019-09-12 (×29): 650 mg via ORAL
  Filled 2019-09-03 (×32): qty 2

## 2019-09-03 MED ORDER — ONDANSETRON HCL 4 MG/2ML IJ SOLN: 4.0000 mg | Freq: Four times a day (QID) | INTRAMUSCULAR | Status: DC | PRN

## 2019-09-03 MED ORDER — OXYCODONE HCL ER 10 MG PO T12A
10.0000 mg | EXTENDED_RELEASE_TABLET | Freq: Two times a day (BID) | ORAL | Status: DC
  Administered 2019-09-03 – 2019-09-07 (×10): 10 mg via ORAL
  Filled 2019-09-03 (×12): qty 1

## 2019-09-03 MED ORDER — MORPHINE SULFATE (PF) 2 MG/ML IV SOLN
1.0000 mg | INTRAVENOUS | Status: DC | PRN
  Administered 2019-09-03: 1 mg via INTRAVENOUS
  Filled 2019-09-03 (×2): qty 1

## 2019-09-03 MED ORDER — EXEMESTANE 25 MG PO TABS
25.0000 mg | ORAL_TABLET | Freq: Every day | ORAL | Status: DC
  Administered 2019-09-03 – 2019-09-13 (×10): 25 mg via ORAL
  Filled 2019-09-03 (×11): qty 1

## 2019-09-03 MED ORDER — POTASSIUM CHLORIDE CRYS ER 20 MEQ PO TBCR
40.0000 meq | EXTENDED_RELEASE_TABLET | Freq: Once | ORAL | Status: AC
  Administered 2019-09-03: 40 meq via ORAL
  Filled 2019-09-03 (×2): qty 2

## 2019-09-03 MED ORDER — POTASSIUM CHLORIDE CRYS ER 20 MEQ PO TBCR
20.0000 meq | EXTENDED_RELEASE_TABLET | Freq: Once | ORAL | Status: AC
  Administered 2019-09-03: 20 meq via ORAL
  Filled 2019-09-03: qty 1

## 2019-09-03 MED ORDER — FENTANYL CITRATE (PF) 100 MCG/2ML IJ SOLN
50.0000 ug | Freq: Once | INTRAMUSCULAR | Status: AC
  Administered 2019-09-03: 50 ug via INTRAVENOUS
  Filled 2019-09-03: qty 2

## 2019-09-03 MED ORDER — LOSARTAN POTASSIUM 25 MG PO TABS
12.5000 mg | ORAL_TABLET | Freq: Every day | ORAL | Status: DC
  Administered 2019-09-03 – 2019-09-12 (×8): 12.5 mg via ORAL
  Filled 2019-09-03 (×3): qty 1
  Filled 2019-09-03: qty 0.5
  Filled 2019-09-03 (×6): qty 1

## 2019-09-03 MED ORDER — ALPRAZOLAM 0.25 MG PO TABS
0.2500 mg | ORAL_TABLET | Freq: Three times a day (TID) | ORAL | Status: DC | PRN
  Administered 2019-09-03 – 2019-09-13 (×15): 0.25 mg via ORAL
  Filled 2019-09-03 (×16): qty 1

## 2019-09-03 NOTE — Progress Notes (Signed)
Patient admitted to the hospital after she presented to the ER with excruciating back pain. Spoke with Leafy Ro, RN caring for patient today and she confirms the patient is stable. Explained our transporters will be up around 1300 today to bring her down for radiation therapy. Informed L4 it is safe to treat patient today. Informed Dr. Sondra Come of all these findings and he is in agreement the patient should be treated today.

## 2019-09-03 NOTE — Plan of Care (Signed)
  Problem: Education: Goal: Knowledge of General Education information will improve Description: Including pain rating scale, medication(s)/side effects and non-pharmacologic comfort measures Outcome: Progressing   Problem: Coping: Goal: Level of anxiety will decrease Outcome: Progressing   

## 2019-09-03 NOTE — Patient Outreach (Signed)
[facility]) Care Management  09/03/2019  Erica Young 1941/02/15 [id number]   CSW was scheduled to outreach to patient today to follow-up regarding social work services and resources, as well as to confirm that patient received the packet of resource information mailed to her home by CSW last week; however, CSW noted that patient is currently hospitalized.  Patient presented to the Emergency Department at [facility] yesterday, Wednesday, Sep 02, 2019, due to experiencing excruciating low back pain.  Patient has requested a social work consult while hospitalized, now wanting assistance in pursuing long-term care placement into an assisted living facility.  CSW sent an In [employer], via Epic, to [facility] Liaison with [place] Management, requesting that she communicate patient's wishes with the assigned inpatient hospital social worker.    Due to patient currently being involved with Maximino Greenland, Licensed Clinical Social Worker with the [facility], as well as a Licensed Clinical Social Worker with [employer], and now an inpatient hospital social worker, [place] will remove self from care team and close patient's case, as to avoid duplication of services and further confusion for patient.  CSW will notify patient's RNCM with [place] Management, Jackelyn Poling of CSW's plans to close patient's case.  CSW will fax an update to patient's Primary Care Physician, Dr. Isaac Bliss to ensure that they are aware of CSW's involvement with patient's plan of care.  Please place new referral for CSW services if additional social work needs are identified in the near future or if patient is discharged back into the community.  [name], BSW, MSW, LCSW  Licensed Education officer, [facility]  Mailing [place] [address],  [place], [place] [address] Physical Address-[address], [place], [place] [address] Toll Free Main # [phone] Fax # [phone] Cell # [phone]  Office # [phone] [email or web address]

## 2019-09-03 NOTE — ED Notes (Signed)
Per charge nurse do not delay transport further.

## 2019-09-03 NOTE — Consult Note (Signed)
   [facility] Inpatient Consult   09/03/2019  Sophonie Goforth [birth year] [id number]   [facility] ACO Patient:  Medicare NextGen  Alerted of patient's admission to [facility] from [facility] Management  LCSW, [name] of patient's admission.  Patient is currently active with [place] Management for chronic disease management services.  Patient has been engaged by a [place] Management Coordinator.  Our community based plan of care has focused on disease management and community resource support.    Plan: Follow for progress and needs with [facility], as patient is just inpatient on the floor and if patient remains at [facility] as well as [facility] CM team for disposition and needs.     [facility] Liaison(s) will follow up with  Inpatient Transition Of Care [TOC] team member [when sign on to Care Team]  to make aware that [place] Management following and barriers to care as stated by [facility] Ronny Bacon see her notes in Epic for details].      Of note, [facility] Care Management services does not replace or interfere with any services that are needed or arranged by inpatient [facility] care management team.  For additional questions or referrals please contact:  Natividad Brood, RN BSN [facility] Liaison  [phone] business mobile phone Toll free office [phone]  Fax number: [phone] [email or web address]

## 2019-09-03 NOTE — Progress Notes (Signed)
Initial Nutrition Assessment  DOCUMENTATION CODES:   Not applicable  INTERVENTION:  Ensure Enlive po BID, each supplement provides 350 kcal and 20 grams of protein Magic cup BID with meals, each supplement provides 290 kcal and 9 grams of protein  NUTRITION DIAGNOSIS:   Inadequate oral intake related to cancer and cancer related treatments(stage IV breast cancer with mets to liver and bone) as evidenced by (meal completion 25-50%)  GOAL:   Patient will meet greater than or equal to 90% of their needs    MONITOR:   PO intake, Supplement acceptance, Weight trends, Labs  REASON FOR ASSESSMENT:   Malnutrition Screening Tool, Consult Assessment of nutrition requirement/status  ASSESSMENT:   79 year old female with past medical history of stage IV breast cancer with mets to liver and bone, CVA, GERD, HTN, HLD, systolic CHF, hypothyroidism presenting with complaints of back pain admitted with vertebral compression fracture.  5/6 RXT - completed #13 out of planned course of 14 treatments  Patient with poor po intake of meals since admission. Per flowsheets, she consumed 50% of breakfast, 25% of lunch, and 50% of dinner this evening. Will provide Ensure and Magic Cup to aid with meeting needs.  Current wt 114.84 lbs Per history, on 05/12/19 pt weighed 126.06 lbs, on 06/24/19 pt weighed 126.28 lbs, on 07/27/19 pt weighed 121.66 lbs, on 08/11/19 pt weighed 117.48 lbs and on 08/18/19 she weighed 116.82 lbs. This indicates a 11.22 lb (8.9%) wt loss over the past 4 months which is significant.  Per notes: -patient moved to inpatient status -failed outpt management of pain control -add long acting oxycodone 10 mg bid -transient hypoxemia likely d/t atelectasis - Medications reviewed and include: Vit D3, Colace, Protonix, Oxycontin IVF: D5 @ 75 ml/hr Labs: K 3.4 (L)  NUTRITION - FOCUSED PHYSICAL EXAM: Unable to complete at this time   Diet Order:   Diet Order            DIET SOFT  Room service appropriate? Yes; Fluid consistency: Thin  Diet effective now              EDUCATION NEEDS:   No education needs have been identified at this time  Skin:  Skin Assessment: Reviewed RN Assessment  Last BM:  5/5 type 4  Height:   Ht Readings from Last 1 Encounters:  09/02/19 5\' 1"  (1.549 m)    Weight:   Wt Readings from Last 1 Encounters:  09/02/19 52.2 kg    BMI:  Body mass index is 21.73 kg/m.  Estimated Nutritional Needs:   Kcal:  2035-5974  Protein:  80-90  Fluid:  >/= 1.5 L/day   Lajuan Lines, RD, LDN Clinical Nutrition After Hours/Weekend Pager # in [phone]

## 2019-09-03 NOTE — ED Notes (Signed)
EDP requesting admitting provider see pt in ED before transporting upstairs.

## 2019-09-03 NOTE — Progress Notes (Addendum)
PROGRESS NOTE    Erica Young  VFI:[id number] DOB: 1940/05/15 DOA: 09/02/2019 PCP: Isaac Bliss, Rayford Halsted, MD    Brief Narrative:  Patient was admitted to the hospital with a working diagnosis of intractable back pain due to acute vertebral L1 compression fracture in the setting of osseous metastasis,  79 year old female who presented with low back pain.  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  Patient currently receiving radiation therapy for bony metastasis.  Her back pain has been worse over the last 2 weeks, 10 out of 10 in intensity, to the point where patient was having difficulty ambulating.  On her initial physical examination blood pressure 153/78, pulse rate 87, respirate 16, oxygen saturation 93%, her lungs were clear to auscultation bilaterally, heart S1-S2 present and rhythmic, abdomen soft, no lower extremity edema.  Lower extremity strength was intact. Sodium 135, potassium 3.4, chloride 97, glucose 108, BUN 8, creatinine 0.4, white count 4.1, hemoglobin 11.7, hematocrit 35.6, platelets 160.  SARS COVID-19 negative.  Urinalysis specific gravity 1.012, 0-5 white cells, 6-10 red cells.  Lumbar spine x-rays with pathologic L1 compression fracture with 50% height loss, new since 07/14/2019.  L5 metastatic disease.   Assessment & Plan:   Principal Problem:   Vertebral compression fracture (HCC) Active Problems:   HTN (hypertension)   Metastatic breast cancer (HCC)   Bone metastases ([place])   Hypoxia   1. Acute L1 compression fracture with intractable pain. Patient continue with significant pain, associated with poor mobility, poor oral intake. Worse symptoms with movement. Patient with persistent pain despite IV fentanyl x1. Morphine X1 and IV ketorolac x2.   Patient has failed outpatient pain management. Will add long acting  oxycodone 10 mg bid, will scheduled q 6 h acetaminophen and q 8 H ibuprofen. Increase morphine dose to 2 mg IV q 2 h as needed for break through pain. Once pain improves will order physical and occupational therapy.   2. Dehydration with hypokalemia and dyspepsia. Add Gi prophylaxis with proton pump inhibitors. Patient with very poor oral intake, will add IV fluids with dextrose. K at 3,4 with serum bicarb at 25 and cr at 0,40, BUN 8. Consult nutrition.   Patient with high risk for worsening dehydration.   3. Metastatic breast cancer/ bont mets. Patient will continue with radiation therapy. Follow with radiation and oncology as outpatient.   Continue with exemestane.   4. HTN. Continue blood pressure control with carvedilol and losartan.   5. Anxiety. Continue with as needed alprazolam.   6. Transient hypoxemia. Likely due to atelectasis, will add incentive spirometer and continue oxymetry.   Status is: Observation  The patient will require care spanning > 2 midnights and should be moved to inpatient because: IV treatments appropriate due to intensity of illness or inability to take PO. Patient has failed outpatient management of pain control, despite multiple IV analgesics and antiinflammatory agents, poor oral intake and severe generalized weakness.  Patient will need more than one midnight for pain control.   Dispo: The patient is from: Home              Anticipated d/c is to: Home              Anticipated d/c date is: 2 days              Patient currently is not medically stable to d/c.  DVT prophylaxis: enoxaparin  Code Status:   dnr  Family Communication:  No family at the bedside        Consultants:   Radiation oncology      Subjective: Patient continue to have significant back pain, despite analgesics, worse with movement, associated with dyspepsia and poor oral intake. She feels very weak and deconditioned.   Objective: Vitals:   09/03/19 0500 09/03/19  0530 09/03/19 0634 09/03/19 0955  BP: 126/75 132/79 (!) 148/82 125/64  Pulse: 71 78 83 92  Resp:  18  17  Temp:   (!) 97.3 F (36.3 C) 98 F (36.7 C)  TempSrc:   Oral   SpO2: 95% 93% 98% 95%  Weight:      Height:        Intake/Output Summary (Last 24 hours) at 09/03/2019 1152 Last data filed at 09/03/2019 0911 Gross per 24 hour  Intake 100 ml  Output --  Net 100 ml   Filed Weights   09/02/19 2326  Weight: 52.2 kg    Examination:   General: Not in pain or dyspnea, deconditioned and ill looking appearing  Neurology: Awake and alert, non focal  E ENT: no pallor, no icterus, oral mucosa dry Cardiovascular: No JVD. S1-S2 present, rhythmic, no gallops, rubs, or murmurs. No lower extremity edema. Pulmonary: positive breath sounds bilaterally, adequate air movement, no wheezing, rhonchi or rales. Gastrointestinal. Abdomen with no organomegaly, non tender, no rebound or guarding Skin. No rashes Musculoskeletal: no joint deformities     Data Reviewed: I have personally reviewed following labs and imaging studies  CBC: Recent Labs  Lab 09/02/19 2351 09/03/19 0000  WBC 4.1  --   NEUTROABS 2.8  --   HGB 11.7* 11.9*  HCT 35.6* 35.0*  MCV 95.4  --   PLT 160  --    Basic Metabolic Panel: Recent Labs  Lab 09/02/19 2351 09/03/19 0000  NA  --  135  K  --  3.4*  CL  --  97*  GLUCOSE  --  108*  BUN  --  8  CREATININE  --  0.40*  MG 1.9  --    GFR: Estimated Creatinine Clearance: 43 mL/min (A) (by C-G formula based on SCr of 0.4 mg/dL (L)). Liver Function Tests: No results for input(s): AST, ALT, ALKPHOS, BILITOT, PROT, ALBUMIN in the last 168 hours. No results for input(s): LIPASE, AMYLASE in the last 168 hours. No results for input(s): AMMONIA in the last 168 hours. Coagulation Profile: No results for input(s): INR, PROTIME in the last 168 hours. Cardiac Enzymes: No results for input(s): CKTOTAL, CKMB, CKMBINDEX, TROPONINI in the last 168 hours. BNP (last 3  results) No results for input(s): PROBNP in the last 8760 hours. HbA1C: No results for input(s): HGBA1C in the last 72 hours. CBG: No results for input(s): GLUCAP in the last 168 hours. Lipid Profile: No results for input(s): CHOL, HDL, LDLCALC, TRIG, CHOLHDL, LDLDIRECT in the last 72 hours. Thyroid Function Tests: No results for input(s): TSH, T4TOTAL, FREET4, T3FREE, THYROIDAB in the last 72 hours. Anemia Panel: No results for input(s): VITAMINB12, FOLATE, FERRITIN, TIBC, IRON, RETICCTPCT in the last 72 hours.    Radiology Studies: I have reviewed all of the imaging during this hospital visit personally     Scheduled Meds: . carvedilol  12.5 mg Oral BID WC  . cholecalciferol  1,000 Units Oral Daily  . docusate sodium  100 mg Oral Daily  . enoxaparin (LOVENOX) injection  40 mg Subcutaneous Q24H  .  exemestane  25 mg Oral QPC breakfast  . ketorolac  15 mg Intravenous Q6H  . loratadine  10 mg Oral Daily  . losartan  12.5 mg Oral Daily   Continuous Infusions:   LOS: 0 days        Zerrick Hanssen Gerome Apley, MD

## 2019-09-03 NOTE — Progress Notes (Signed)
[facility] Therapy Treatment Record Phone [phone]   Radiation Therapy was administered to Erica Young on: 09/03/2019  1:44 PM and was treatment # 13 out of a planned course of 14 treatments.  Radiation Treatment  1). Beam photons with 6-10 energy and Photons 10-19 MeV  2). Brachytherapy None  3). Stereotactic Radiosurgery None  4). Other Radiation None     Shabazz Mckey, RT (T)

## 2019-09-03 NOTE — H&P (Signed)
History and Physical    Erica Young YIA:[id number] DOB: [birth year] DOA: 09/02/2019  PCP: Isaac Bliss, Rayford Halsted, MD Patient coming from: Home  Chief Complaint: Low back pain  HPI: Erica Young is a 79 y.o. 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  Patient states she has chronic lower back pain which radiates to her right lower extremity and is associated with numbness and tingling in this leg.  States she has a tumor in her back for which he is currently receiving radiation by Dr. Sondra Come.  For the past few weeks the pain has been excruciating.  It has been very difficult for her to ambulate even with her walker.  Denies any recent falls.  Denies saddle anesthesia or bowel/bladder dysfunction.  She currently lives by herself as her husband recently passed away.  Denies fevers or chills.  Denies cough, shortness of breath, or chest pain.  States she has already received her Covid vaccines.  ED Course: Afebrile.  No leukocytosis on labs.  Potassium 3.4.  UA not suggestive of infection.  SARS-CoV-2 PCR test negative. Chest x-ray showing right hilar adenopathy and multiple known pulmonary nodules.  No acute cardiopulmonary abnormality.  Chronic elevation of the right hemidiaphragm. X-ray of lumbar spine showing a pathologic L1 compression fracture with 50% height loss, new since abdominal CT done 07/14/2019.  L5 metastatic disease. Patient was given IV Decadron 10 mg, fentanyl, and Toradol.  Review of Systems:  Young systems reviewed and apart from history of presenting illness, are negative.  Past Medical History:  Diagnosis Date  . Allergy   . Anxiety   . Bone cancer ([place]) mri 11/11/14   right parietal bone,left cribriform plate metastases  . Cancer [facility]) 2007   right breat ca  , lumpectomy and radiation tx (declined chemo and  additional prophylactic meds)  . Cataract    both eyes  10/2016 Lt.     04/2017 Rt.   . CEREBROVASCULAR DISEASE 03/03/2010  . Diverticulitis   . DIVERTICULOSIS, COLON 03/03/2010  . Fatty liver 08/02/09   as per U/S done by [facility]  . Foramen ovale    positive bubble study  . GERD (gastroesophageal reflux disease)   . Headache(784.0)    she thinks sinus headaches  . History of cerebral artery stenosis    right middle  . HYPERLIPIDEMIA 03/03/2010  . Hypertension   . HYPOTHYROIDISM 03/03/2010   no longer on meds  . Internal hemorrhoid   . Low back pain   . Mass of sinus   . Mastoiditis    noted on brain MRI  . Rib fracture   . Right leg pain   . Stroke [facility]) Oct. 2011   TIA  . Ulcer     Past Surgical History:  Procedure Laterality Date  . BREAST LUMPECTOMY     right  . CATARACT EXTRACTION Left    10/2016   Rt 04/2017  . CHOLECYSTECTOMY    . COLONOSCOPY    . POLYPECTOMY     benign cecum  . SHOULDER SURGERY     right shoulder (dislocation)  . TONSILLECTOMY    . TUBAL LIGATION    . UPPER GASTROINTESTINAL ENDOSCOPY    . VIDEO BRONCHOSCOPY WITH ENDOBRONCHIAL ULTRASOUND N/A 01/07/2014   Procedure: VIDEO BRONCHOSCOPY WITH ENDOBRONCHIAL ULTRASOUND;  Surgeon: Ivin Poot, MD;  Location: [place];  Service: Thoracic;  Laterality: N/A;     reports that she quit smoking about 28 years ago. Her smoking use included cigarettes. She started smoking about 48 years ago. She has a 10.00 pack-year smoking history. She has never used smokeless tobacco. She reports that she does not drink alcohol or use drugs.  Allergies  Allergen Reactions  . Ciprofloxacin Anaphylaxis    Family History  Problem Relation Age of Onset  . Heart disease Sister        cerebral vascular disease also  . Heart disease Brother        cerebral vascular disease also  . Heart disease Brother        cerebral vascular disease  . Heart disease Sister        cebreal vascular disease also  . Heart  disease Sister        cebreal vascular diease also  . Heart disease Sister        cerebral vascular diease also  . Stomach cancer Father   . Colon cancer Neg Hx   . Esophageal cancer Neg Hx   . Rectal cancer Neg Hx   . Colon polyps Neg Hx   . Asthma Neg Hx     Prior to Admission medications   Medication Sig Start Date End Date Taking? Authorizing Provider  ALPRAZolam (XANAX) 0.25 MG tablet Take 1 tablet (0.25 mg total) by mouth 3 (three) times daily as needed. for anxiety 08/18/19  Yes Isaac Bliss, Rayford Halsted, MD  aluminum-magnesium hydroxide 200-200 MG/5ML suspension Take by mouth every 6 (six) hours as needed for indigestion.   Yes [provider]  aspirin EC 81 MG tablet Take 81 mg by mouth daily after breakfast.    Yes [provider]  carvedilol (COREG) 12.5 MG tablet Take 1 tablet (12.5 mg total) by mouth 2 (two) times daily with a meal. 06/05/19  Yes Isaac Bliss, Rayford Halsted, MD  Cholecalciferol (VITAMIN D PO) Take 1,000 Units by mouth daily.    Yes [provider]  docusate sodium (COLACE) 100 MG capsule Take 100 mg by mouth daily.   Yes [provider]  exemestane (AROMASIN) 25 MG tablet Take 1 tablet (25 mg total) by mouth daily after breakfast. 07/27/19  Yes Magrinat, Virgie Dad, MD  Glucosamine 500 MG CAPS Take 500 mg by mouth in the morning and at bedtime.    Yes [provider]  HYDROcodone-acetaminophen (NORCO/VICODIN) 5-325 MG tablet Take 1 tablet by mouth every 6 (six) hours as needed. Patient taking differently: Take 1 tablet by mouth every 6 (six) hours as needed for moderate pain.  08/31/19  Yes Magrinat, Virgie Dad, MD  loratadine (CLARITIN) 10 MG tablet Take 1 tablet (10 mg total) by mouth daily. 04/16/19  Yes Isaac Bliss, Rayford Halsted, MD  losartan (COZAAR) 25 MG tablet Take 0.5 tablets (12.5 mg total) by mouth daily. 08/19/19  Yes Isaac Bliss, Rayford Halsted, MD  ondansetron (ZOFRAN-ODT) 4 MG disintegrating tablet Take 1 tablet  (4 mg total) by mouth every 8 (eight) hours as needed for nausea or vomiting. 08/18/19  Yes Davonna Belling, MD  meclizine (ANTIVERT) 25 MG tablet Take 1 tablet (25 mg total) by mouth 3 (three) times daily as needed for dizziness. Patient not taking: Reported on 08/18/2019 07/14/19   Kennyth Arnold, FNP    Physical Exam: Vitals:   09/03/19 0400 09/03/19 0427 09/03/19 0500 09/03/19 0530  BP: (!) 153/78 (!) 148/84 126/75 132/79  Pulse: 87 78 71 78  Resp: 16  16  18  Temp:      TempSrc:      SpO2: 94% 93% 95% 93%  Weight:      Height:        Physical Exam  Constitutional: She is oriented to person, place, and time. She appears well-developed and well-nourished.  HENT:  Head: Normocephalic.  Eyes: Right eye exhibits no discharge. Left eye exhibits no discharge.  Cardiovascular: Normal rate, regular rhythm and intact distal pulses.  Pulmonary/Chest: Effort normal and breath sounds normal. No respiratory distress. She has no wheezes. She has no rales.  Abdominal: Soft. Bowel sounds are normal. She exhibits no distension. There is no abdominal tenderness. There is no guarding.  Musculoskeletal:        General: No edema.     Cervical back: Neck supple.  Neurological: She is alert and oriented to person, place, and time.  Strength grossly intact in bilateral lower extremities.  Able to raise both her legs up in the bed against gravity.  Skin: Skin is warm and dry. She is not diaphoretic.    Labs on Admission: I have personally reviewed following labs and imaging studies  CBC: Recent Labs  Lab 09/02/19 2351 09/03/19 0000  WBC 4.1  --   NEUTROABS 2.8  --   HGB 11.7* 11.9*  HCT 35.6* 35.0*  MCV 95.4  --   PLT 160  --    Basic Metabolic Panel: Recent Labs  Lab 09/02/19 2351 09/03/19 0000  NA  --  135  K  --  3.4*  CL  --  97*  GLUCOSE  --  108*  BUN  --  8  CREATININE  --  0.40*  MG 1.9  --    GFR: Estimated Creatinine Clearance: 43 mL/min (A) (by C-G formula based on  SCr of 0.4 mg/dL (L)). Liver Function Tests: No results for input(s): AST, ALT, ALKPHOS, BILITOT, PROT, ALBUMIN in the last 168 hours. No results for input(s): LIPASE, AMYLASE in the last 168 hours. No results for input(s): AMMONIA in the last 168 hours. Coagulation Profile: No results for input(s): INR, PROTIME in the last 168 hours. Cardiac Enzymes: No results for input(s): CKTOTAL, CKMB, CKMBINDEX, TROPONINI in the last 168 hours. BNP (last 3 results) No results for input(s): PROBNP in the last 8760 hours. HbA1C: No results for input(s): HGBA1C in the last 72 hours. CBG: No results for input(s): GLUCAP in the last 168 hours. Lipid Profile: No results for input(s): CHOL, HDL, LDLCALC, TRIG, CHOLHDL, LDLDIRECT in the last 72 hours. Thyroid Function Tests: No results for input(s): TSH, T4TOTAL, FREET4, T3FREE, THYROIDAB in the last 72 hours. Anemia Panel: No results for input(s): VITAMINB12, FOLATE, FERRITIN, TIBC, IRON, RETICCTPCT in the last 72 hours. Urine analysis:    Component Value Date/Time   COLORURINE YELLOW 09/03/2019 0241   APPEARANCEUR CLEAR 09/03/2019 0241   LABSPEC 1.012 09/03/2019 0241   PHURINE 6.0 09/03/2019 0241   GLUCOSEU NEGATIVE 09/03/2019 0241   HGBUR SMALL (A) 09/03/2019 0241   BILIRUBINUR NEGATIVE 09/03/2019 0241   BILIRUBINUR neg 02/16/2015 1318   KETONESUR 5 (A) 09/03/2019 0241   PROTEINUR NEGATIVE 09/03/2019 0241   UROBILINOGEN 0.2 02/16/2015 1318   UROBILINOGEN 0.2 11/18/2013 1321   NITRITE NEGATIVE 09/03/2019 0241   LEUKOCYTESUR NEGATIVE 09/03/2019 0241    Radiological Exams on Admission: DG Chest 2 View  Result Date: 09/03/2019 CLINICAL DATA:  Low back pain and shortness of breath increasing EXAM: CHEST - 2 VIEW COMPARISON:  CT 02/02/2019, radiograph 01/07/2014  FINDINGS: Right hilar adenopathy is similar to comparison CT. Scattered pulmonary nodules seen on cross-sectional imaging are not well visualized on this exam. There is chronic elevation  of the right hemidiaphragm. No consolidation, features of edema, pneumothorax, or effusion. The aorta is calcified. The remaining cardiomediastinal contours are unremarkable. postsurgical changes are noted along the right chest wall. Cholecystectomy clips in the right upper quadrant. No acute osseous or soft tissue abnormality. Degenerative changes are present in the imaged spine and shoulders. IMPRESSION: Right hilar adenopathy, seen on prior cross-sectional imaging. Multiple known pulmonary nodules are poorly visualized on this exam. No acute cardiopulmonary abnormality. Chronic elevation of the right hemidiaphragm. Aortic Atherosclerosis (ICD10-I70.0). Electronically Signed   By: Lovena Le M.D.   On: 09/03/2019 04:03   DG Lumbar Spine Complete  Result Date: 09/03/2019 CLINICAL DATA:  Insert patient with low back pain EXAM: LUMBAR SPINE - COMPLETE 4+ VIEW COMPARISON:  07/14/2019 FINDINGS: Since comparison study the sclerotic L1 body has compressed by 50%. Height loss is right eccentric, where the tumor bulk is greatest by CT. There was extraosseous tumor at this level by CT, with indistinct posterior cortex today. L5 sclerosis which is also metastatic. Mild scoliosis. IMPRESSION: 1. Pathologic L1 compression fracture with 50% height loss, new since abdominal CT 07/14/2019 2. L5 metastatic disease. Electronically Signed   By: Monte Fantasia M.D.   On: 09/03/2019 04:05   Assessment/Plan Principal Problem:   Vertebral compression fracture (HCC) Active Problems:   HTN (hypertension)   Metastatic breast cancer (HCC)   Bone metastases (HCC)   Hypoxia   Vertebral compression fracture in setting of osseous metastasis: X-ray of lumbar spine showing a pathologic L1 compression fracture with 50% height loss, new since abdominal CT done 07/14/2019.  L5 metastatic disease. -Toradol scheduled, morphine as needed for severe pain.  PT and OT evaluation.  May benefit from vertebroplasty/kyphoplasty.  Please  discuss with neuro IR in a.m.  Mild hypokalemia: Replete potassium.  Check magnesium level and replete if low.  Continue to monitor electrolytes.  Mild hypoxia: Oxygen saturation as low as 92% on room air in the ED, currently 98% on room air.  No increased work of breathing.  Lungs clear on exam.  Chest x-ray showing chronic elevation of the right hemidiaphragm and no acute cardiopulmonary abnormality.  SARS-CoV-2 PCR test negative. -Continuous pulse ox, supplemental oxygen if needed.  Breast cancer with mets to liver and bone: Currently on exemestane.  She is receiving radiation for radicular pain due to osseous metastasis in the lumbar spine area.  Per palliative care note, plan is for her to go to hospice on 5/7 once radiation is complete.  Hypertension: Stable.  Continue home Coreg and losartan  DVT prophylaxis: Lovenox Code Status: Patient wishes to be DNR. Family Communication: No family available at this time. Disposition Plan: Status is: Observation  The patient remains OBS appropriate and will d/c before 2 midnights.  Dispo: The patient is from: Home              Anticipated d/c is to: SNF              Anticipated d/c date is: 1 day              Patient currently is not medically stable to d/c.  The medical decision making on this patient was of high complexity and the patient is at high risk for clinical deterioration, therefore this is a level 3 visit.  Shela Leff MD [facility]  If 7PM-7AM, please contact night-coverage [email or web address]  09/03/2019, 5:57 AM

## 2019-09-03 NOTE — Progress Notes (Signed)
OT Cancellation Note  Patient Details Name: Kalandra Masters MRN: [id number] DOB: 1940-06-30   Cancelled Treatment:    Reason Eval/Treat Not Completed: Pain limiting ability to participate(Pain continues to be uncontrolled. Will hold evaluation today and f/u tomorrow.)  Lenward Chancellor 09/03/2019, 4:16 PM

## 2019-09-03 NOTE — ED Provider Notes (Signed)
[place] DEPT Provider Note   CSN: [id number] Arrival date & time: 09/02/19  2257     History No chief complaint on file.   Erica Young is a 79 y.o. female.  The history is provided by the patient.  Back Pain Location:  Lumbar spine Quality:  Shooting Radiates to:  R posterior upper leg Pain severity:  Severe Pain is:  Same all the time Onset quality:  Gradual Timing:  Constant Progression:  Worsening Chronicity:  Recurrent Context comment:  Known cancer with bony mets  Relieved by:  Nothing Worsened by:  Nothing Ineffective treatments:  Narcotics Associated symptoms: leg pain   Associated symptoms: no abdominal pain, no abdominal swelling, no bladder incontinence, no bowel incontinence, no chest pain, no dysuria, no fever, no headaches, no numbness, no paresthesias, no pelvic pain, no perianal numbness, no tingling and no weight loss   Associated symptoms comment:  No difficulty urinating  Risk factors: no hx of cancer   Patient with metastatic breast cancer with bony mets presents with low back pain and RLE pain that is unrelieved by home narcotics.  No bowel nor bladder changes no weakness no numbness.        Past Medical History:  Diagnosis Date  . Allergy   . Anxiety   . Bone cancer ([place]) mri 11/11/14   right parietal bone,left cribriform plate metastases  . Cancer [facility]) 2007   right breat ca  , lumpectomy and radiation tx (declined chemo and additional prophylactic meds)  . Cataract    both eyes  10/2016 Lt.     04/2017 Rt.   . CEREBROVASCULAR DISEASE 03/03/2010  . Diverticulitis   . DIVERTICULOSIS, COLON 03/03/2010  . Fatty liver 08/02/09   as per U/S done by [facility]  . Foramen ovale    positive bubble study  . GERD (gastroesophageal reflux disease)   . Headache(784.0)    she thinks sinus headaches  . History of cerebral artery stenosis    right middle  . HYPERLIPIDEMIA 03/03/2010  . Hypertension   .  HYPOTHYROIDISM 03/03/2010   no longer on meds  . Internal hemorrhoid   . Low back pain   . Mass of sinus   . Mastoiditis    noted on brain MRI  . Rib fracture   . Right leg pain   . Stroke [facility]) Oct. 2011   TIA  . Ulcer     Patient Active Problem List   Diagnosis Date Noted  . Liver metastasis ([place]) 07/27/2019  . Goals of care, counseling/discussion 05/12/2019  . Syncope   . Acute systolic (congestive) heart failure ([place])   . Shortness of breath   . Acute left-sided weakness 01/29/2019  . Leukopenia due to antineoplastic chemotherapy ([place]) 01/29/2019  . Anxiety 01/29/2019  . Aortic atherosclerosis ([place]) 01/03/2018  . History of epistaxis 10/15/2017  . GI symptoms/possible food intolerance 06/18/2017  . Malignant neoplasm of upper-outer quadrant of right breast in female, estrogen receptor positive ([place]) 01/19/2016  . Bone metastases ([place]) 11/22/2014  . Perennial allergic rhinitis with a nonallergic component 02/12/2014  . Cancer of right breast, stage 4 ([place]) 01/23/2014  . History of breast cancer 01/11/2014  . Chronic rhinitis 09/17/2013  . Hilar adenopathy 05/12/2013  . Abdominal pain 05/07/2011  . Hypertension 06/09/2010  . BACK PAIN 04/10/2010  . Dyslipidemia 03/03/2010  . Cerebrovascular disease 03/03/2010  . DIVERTICULOSIS, COLON 03/03/2010    Past Surgical History:  Procedure Laterality Date  . BREAST LUMPECTOMY  right  . CATARACT EXTRACTION Left    10/2016   Rt 04/2017  . CHOLECYSTECTOMY    . COLONOSCOPY    . POLYPECTOMY     benign cecum  . SHOULDER SURGERY     right shoulder (dislocation)  . TONSILLECTOMY    . TUBAL LIGATION    . UPPER GASTROINTESTINAL ENDOSCOPY    . VIDEO BRONCHOSCOPY WITH ENDOBRONCHIAL ULTRASOUND N/A 01/07/2014   Procedure: VIDEO BRONCHOSCOPY WITH ENDOBRONCHIAL ULTRASOUND;  Surgeon: Ivin Poot, MD;  Location: [facility] OR;  Service: Thoracic;  Laterality: N/A;     OB History    Gravida  4   Para  2   Term      Preterm       AB      Living        SAB      TAB      Ectopic      Multiple      Live Births              Family History  Problem Relation Age of Onset  . Heart disease Sister        cerebral vascular disease also  . Heart disease Brother        cerebral vascular disease also  . Heart disease Brother        cerebral vascular disease  . Heart disease Sister        cebreal vascular disease also  . Heart disease Sister        cebreal vascular diease also  . Heart disease Sister        cerebral vascular diease also  . Stomach cancer Father   . Colon cancer Neg Hx   . Esophageal cancer Neg Hx   . Rectal cancer Neg Hx   . Colon polyps Neg Hx   . Asthma Neg Hx     Social History   Tobacco Use  . Smoking status: Former Smoker    Packs/day: 0.50    Years: 20.00    Pack years: 10.00    Types: Cigarettes    Start date: 77    Quit date: 05/01/1991    Years since quitting: 28.3  . Smokeless tobacco: Never Used  Substance Use Topics  . Alcohol use: No    Alcohol/week: 0.0 standard drinks  . Drug use: No    Home Medications Prior to Admission medications   Medication Sig Start Date End Date Taking? Authorizing Provider  ALPRAZolam (XANAX) 0.25 MG tablet Take 1 tablet (0.25 mg total) by mouth 3 (three) times daily as needed. for anxiety 08/18/19  Yes Isaac Bliss, Rayford Halsted, MD  aluminum-magnesium hydroxide 200-200 MG/5ML suspension Take by mouth every 6 (six) hours as needed for indigestion.   Yes [provider]  aspirin EC 81 MG tablet Take 81 mg by mouth daily after breakfast.    Yes [provider]  carvedilol (COREG) 12.5 MG tablet Take 1 tablet (12.5 mg total) by mouth 2 (two) times daily with a meal. 06/05/19  Yes Isaac Bliss, Rayford Halsted, MD  Cholecalciferol (VITAMIN D PO) Take 1,000 Units by mouth daily.    Yes [provider]  docusate sodium (COLACE) 100 MG capsule Take 100 mg by mouth daily.   Yes [provider]  exemestane  (AROMASIN) 25 MG tablet Take 1 tablet (25 mg total) by mouth daily after breakfast. 07/27/19  Yes Magrinat, Virgie Dad, MD  Glucosamine 500 MG CAPS Take 500 mg by mouth  in the morning and at bedtime.    Yes [provider]  HYDROcodone-acetaminophen (NORCO/VICODIN) 5-325 MG tablet Take 1 tablet by mouth every 6 (six) hours as needed. Patient taking differently: Take 1 tablet by mouth every 6 (six) hours as needed for moderate pain.  08/31/19  Yes Magrinat, Virgie Dad, MD  loratadine (CLARITIN) 10 MG tablet Take 1 tablet (10 mg total) by mouth daily. 04/16/19  Yes Isaac Bliss, Rayford Halsted, MD  losartan (COZAAR) 25 MG tablet Take 0.5 tablets (12.5 mg total) by mouth daily. 08/19/19  Yes Isaac Bliss, Rayford Halsted, MD  ondansetron (ZOFRAN-ODT) 4 MG disintegrating tablet Take 1 tablet (4 mg total) by mouth every 8 (eight) hours as needed for nausea or vomiting. 08/18/19  Yes Davonna Belling, MD  meclizine (ANTIVERT) 25 MG tablet Take 1 tablet (25 mg total) by mouth 3 (three) times daily as needed for dizziness. Patient not taking: Reported on 08/18/2019 07/14/19   Dutch Quint B, FNP    Allergies    Ciprofloxacin  Review of Systems   Review of Systems  Constitutional: Negative for fever and weight loss.  HENT: Negative for congestion.   Eyes: Negative for visual disturbance.  Respiratory: Negative for cough and shortness of breath.   Cardiovascular: Negative for chest pain, palpitations and leg swelling.  Gastrointestinal: Negative for abdominal pain and bowel incontinence.  Genitourinary: Negative for bladder incontinence, dysuria and pelvic pain.  Musculoskeletal: Positive for back pain.  Skin: Negative for rash.  Neurological: Negative for tingling, numbness, headaches and paresthesias.  Psychiatric/Behavioral: Negative for agitation.  All other systems reviewed and are negative.   Physical Exam Updated Vital Signs BP (!) 148/84   Pulse 78   Temp 97.7 F (36.5 C) (Oral)   Resp  16   Ht 5\' 1"  (1.549 m)   Wt 52.2 kg   SpO2 93%   BMI 21.73 kg/m   Physical Exam Vitals and nursing note reviewed.  Constitutional:      General: She is not in acute distress.    Appearance: Normal appearance.  HENT:     Head: Normocephalic and atraumatic.     Nose: Nose normal.  Eyes:     Conjunctiva/sclera: Conjunctivae normal.     Pupils: Pupils are equal, round, and reactive to light.  Cardiovascular:     Rate and Rhythm: Normal rate and regular rhythm.     Pulses: Normal pulses.     Heart sounds: Normal heart sounds.  Pulmonary:     Effort: Pulmonary effort is normal.     Breath sounds: Normal breath sounds.  Abdominal:     General: Abdomen is flat. Bowel sounds are normal.     Tenderness: There is no abdominal tenderness. There is no guarding or rebound.  Musculoskeletal:        General: Normal range of motion.     Cervical back: Normal range of motion and neck supple.     Right hip: Normal.     Left hip: Normal.     Right upper leg: Normal.     Left upper leg: Normal.     Right knee: Normal.     Left knee: Normal.     Right lower leg: Normal.     Left lower leg: Normal.     Right ankle: Normal.     Right Achilles Tendon: Normal.     Left ankle: Normal.     Left Achilles Tendon: Normal.     Right foot: Normal.  Left foot: Normal.  Skin:    General: Skin is warm and dry.     Capillary Refill: Capillary refill takes less than 2 seconds.  Neurological:     General: No focal deficit present.     Mental Status: She is alert and oriented to person, place, and time.     Deep Tendon Reflexes: Reflexes normal.  Psychiatric:        Mood and Affect: Mood normal.        Behavior: Behavior normal.     ED Results / Procedures / Treatments   Labs (all labs ordered are listed, but only abnormal results are displayed) Labs Reviewed  CBC WITH DIFFERENTIAL/PLATELET - Abnormal; Notable for the following components:      Result Value   RBC 3.73 (*)    Hemoglobin  11.7 (*)    HCT 35.6 (*)    Lymphs Abs 0.4 (*)    All other components within normal limits  URINALYSIS, ROUTINE W REFLEX MICROSCOPIC - Abnormal; Notable for the following components:   Hgb urine dipstick SMALL (*)    Ketones, ur 5 (*)    Bacteria, UA RARE (*)    All other components within normal limits  I-STAT CHEM 8, ED - Abnormal; Notable for the following components:   Potassium 3.4 (*)    Chloride 97 (*)    Creatinine, Ser 0.40 (*)    Glucose, Bld 108 (*)    Hemoglobin 11.9 (*)    HCT 35.0 (*)    All other components within normal limits  RESPIRATORY PANEL BY RT PCR (FLU A&B, COVID)    EKG None  Radiology DG Chest 2 View  Result Date: 09/03/2019 CLINICAL DATA:  Low back pain and shortness of breath increasing EXAM: CHEST - 2 VIEW COMPARISON:  CT 02/02/2019, radiograph 01/07/2014 FINDINGS: Right hilar adenopathy is similar to comparison CT. Scattered pulmonary nodules seen on cross-sectional imaging are not well visualized on this exam. There is chronic elevation of the right hemidiaphragm. No consolidation, features of edema, pneumothorax, or effusion. The aorta is calcified. The remaining cardiomediastinal contours are unremarkable. postsurgical changes are noted along the right chest wall. Cholecystectomy clips in the right upper quadrant. No acute osseous or soft tissue abnormality. Degenerative changes are present in the imaged spine and shoulders. IMPRESSION: Right hilar adenopathy, seen on prior cross-sectional imaging. Multiple known pulmonary nodules are poorly visualized on this exam. No acute cardiopulmonary abnormality. Chronic elevation of the right hemidiaphragm. Aortic Atherosclerosis (ICD10-I70.0). Electronically Signed   By: Lovena Le M.D.   On: 09/03/2019 04:03   DG Lumbar Spine Complete  Result Date: 09/03/2019 CLINICAL DATA:  Insert patient with low back pain EXAM: LUMBAR SPINE - COMPLETE 4+ VIEW COMPARISON:  07/14/2019 FINDINGS: Since comparison study the  sclerotic L1 body has compressed by 50%. Height loss is right eccentric, where the tumor bulk is greatest by CT. There was extraosseous tumor at this level by CT, with indistinct posterior cortex today. L5 sclerosis which is also metastatic. Mild scoliosis. IMPRESSION: 1. Pathologic L1 compression fracture with 50% height loss, new since abdominal CT 07/14/2019 2. L5 metastatic disease. Electronically Signed   By: Monte Fantasia M.D.   On: 09/03/2019 04:05    Procedures Procedures (including critical care time)  Medications Ordered in ED Medications  dexamethasone (DECADRON) injection 10 mg (10 mg Intravenous Given 09/02/19 2347)  ketorolac (TORADOL) 30 MG/ML injection 30 mg (30 mg Intravenous Given 09/03/19 0423)  fentaNYL (SUBLIMAZE) injection 50 mcg (50 mcg Intravenous  Given 09/03/19 0425)    ED Course  I have reviewed the triage vital signs and the nursing notes.  Pertinent labs & imaging results that were available during my care of the patient were reviewed by me and considered in my medical decision making (see chart for details).    Patient will likely need kyphoplasty of her new compression fracture.  I suspect the patient also needs placement post acute stay in the hospital.  There are no signs of acute cord injury.  She is moving all 4 extremities and has intact sensation.     Final Clinical Impression(s) / ED Diagnoses Admit to medicine    Dredyn Gubbels, MD 09/03/19 [id number]

## 2019-09-03 NOTE — Progress Notes (Signed)
PT Cancellation Note  Patient Details Name: Erica Young MRN: [id number] DOB: June 04, 1940   Cancelled Treatment:     PT order received but eval deferred this date - RN advises pt pain poorly controlled and exacerbated by min activity.  Will follow.  Debe Coder PT Acute Rehabilitation Services Pager [phone] Office [phone]    [facility] 09/03/2019, 4:44 PM

## 2019-09-03 NOTE — ED Notes (Signed)
Pt needed aistance to bedside toilet. PT o2 saturations increased to 97% when standing

## 2019-09-04 ENCOUNTER — Ambulatory Visit
Admission: RE | Admit: 2019-09-04 | Discharge: 2019-09-04 | Disposition: A | Discharge status: Home / Self Care | Payer: Medicare Other | Source: Ambulatory Visit | Attending: Radiation Oncology | Admitting: Radiation Oncology

## 2019-09-04 ENCOUNTER — Encounter: Payer: Self-pay | Admitting: Radiation Oncology

## 2019-09-04 ENCOUNTER — Other Ambulatory Visit: Payer: Self-pay | Admitting: *Deleted

## 2019-09-04 DIAGNOSIS — C7951 Secondary malignant neoplasm of bone: Secondary | ICD-10-CM | POA: Diagnosis not present

## 2019-09-04 LAB — BASIC METABOLIC PANEL
Anion gap: 5 (ref 5–15)
BUN: 12 mg/dL (ref 8–23)
CO2: 24 mmol/L (ref 22–32)
Calcium: 8.4 mg/dL — ABNORMAL LOW (ref 8.9–10.3)
Chloride: 104 mmol/L (ref 98–111)
Creatinine, Ser: 0.59 mg/dL (ref 0.44–1.00)
GFR calc Af Amer: >60 mL/min (ref >60)
GFR calc non Af Amer: >60 mL/min (ref >60)
Glucose, Bld: 117 mg/dL — ABNORMAL HIGH (ref 70–99)
Potassium: 4.3 mmol/L (ref 3.5–5.1)
Sodium: 133 mmol/L — ABNORMAL LOW (ref 135–145)

## 2019-09-04 MED ORDER — ENSURE ENLIVE PO LIQD
237.0000 mL | Freq: Three times a day (TID) | ORAL | Status: DC
  Administered 2019-09-04 – 2019-09-12 (×9): 237 mL via ORAL

## 2019-09-04 MED ORDER — OXYCODONE HCL 5 MG PO TABS
5.0000 mg | ORAL_TABLET | ORAL | Status: DC | PRN
  Administered 2019-09-06 – 2019-09-13 (×8): 5 mg via ORAL
  Filled 2019-09-04 (×10): qty 1

## 2019-09-04 NOTE — TOC Initial Note (Signed)
Transition of Care [facility]) - Initial/Assessment Note    Patient Details  Name: Erica Young MRN: [id number] Date of Birth: 02/06/1941  Transition of Care [facility]) CM/SW Contact:    Lennart Pall, LCSW Phone Number: 09/04/2019, 4:34 PM  Clinical Narrative:    Met with pt to review d/c plans.  Discussed recommendations from PT and OT for SNF.  Pt very focused on an appointment she had with Hospice tomorrow and very much wants to keep this.  She understands recommendation for SNF, however, very much wants to talk with Hospice prior to making decision.  She is also concerned about insurance coverage.  Per chart review, pt is followed currently by CM with [facility] Palliative team.  I contacted [facility] (had recently had a home visit with pt) and informed of pt's hospital admission.  Discussed what appears, per documentation, that plan had been that pt would transition to Hospice care once her radiation txs were complete and she had her final tx today.  Explained to Ms. Nadara Mustard that pt very much wants to meet with Hospice liaison prior to making any decisions about SNF.  Was able to have pt hear our conversation via speaker phone and pt aware that Ms. Nadara Mustard will alert the [facility] hospital team that pt would like to meet with them on Monday.  Pt agreeable that I will follow up with her about d/c plan/ SNF once she has had a chance to make a plan with Hospice.   Pt very concerned that "people are making plans for me without me."  Attempted to reassure her that we want her to be involved as much as she is able.    Contacted pt's son, [name], and informed of the above information.  He is very appreciative and very much in agreement with plan for SNF.               Expected Discharge Plan: Skilled Nursing Facility Barriers to Discharge: Continued Medical Work up   Patient Goals and CMS Choice Patient states their goals for this hospitalization and ongoing recovery are:: Pt is aware of SNF  recommendation - undecided      Expected Discharge Plan and Services Expected Discharge Plan: [place]       Living arrangements for the past 2 months: [place]                                      Prior Living Arrangements/Services Living arrangements for the past 2 months: Single Family Home Lives with:: Self Patient language and need for interpreter reviewed:: Yes Do you feel safe going back to the place where you live?: No   pt requires increased level of assistance and family unable to provide this  Need for Family Participation in Patient Care: Yes (Comment) Care giver support system in place?: No (comment)   Criminal Activity/Legal Involvement Pertinent to Current Situation/Hospitalization: No - Comment as needed  Activities of Daily Living Home Assistive Devices/Equipment: Blood pressure cuff, Eyeglasses, Walker (specify type) ADL Screening (condition at time of admission) Patient's cognitive ability adequate to safely complete daily activities?: Yes Is the patient deaf or have difficulty hearing?: No Does the patient have difficulty seeing, even when wearing glasses/contacts?: No Does the patient have difficulty concentrating, remembering, or making decisions?: No Patient able to express need for assistance with ADLs?: Yes Does the patient have difficulty dressing or bathing?: Yes  Independently performs ADLs?: No Communication: Independent Dressing (OT): Needs assistance Is this a change from baseline?: Pre-admission baseline Grooming: Needs assistance Is this a change from baseline?: Pre-admission baseline Feeding: Independent Bathing: Needs assistance Is this a change from baseline?: Pre-admission baseline Toileting: Needs assistance Is this a change from baseline?: Pre-admission baseline In/Out Bed: Needs assistance Is this a change from baseline?: Pre-admission baseline Walks in Home: Needs assistance Is this a change from  baseline?: Pre-admission baseline Does the patient have difficulty walking or climbing stairs?: Yes Weakness of Legs: Both Weakness of Arms/Hands: Both  Permission Sought/Granted Permission sought to share information with : Case Manager, Family Supports Permission granted to share information with : Yes, Verbal Permission Granted  Share Information with NAME: [name]  Permission granted to share info w AGENCY: [place] granted to share info w Relationship: son  Permission granted to share info w Contact Information: [phone]  Emotional Assessment Appearance:: Appears stated age Attitude/Demeanor/Rapport: Guarded Affect (typically observed): Frustrated Orientation: : Oriented to Self, Oriented to Place, Oriented to  Time, Oriented to Situation Alcohol / Substance Use: Not Applicable Psych Involvement: No (comment)  Admission diagnosis:  Vertebral compression fracture (HCC) [M48.50XA] Compression fracture of L1 lumbar vertebra ([place]) [S32.010A] Patient Active Problem List   Diagnosis Date Noted  . Vertebral compression fracture ([place]) 09/03/2019  . Hypoxia 09/03/2019  . Compression fracture of L1 lumbar vertebra ([place]) 09/03/2019  . Liver metastasis ([place]) 07/27/2019  . Goals of care, counseling/discussion 05/12/2019  . Syncope   . Acute systolic (congestive) heart failure ([place])   . Shortness of breath   . Acute left-sided weakness 01/29/2019  . Leukopenia due to antineoplastic chemotherapy ([place]) 01/29/2019  . Anxiety 01/29/2019  . Aortic atherosclerosis ([place]) 01/03/2018  . History of epistaxis 10/15/2017  . GI symptoms/possible food intolerance 06/18/2017  . Malignant neoplasm of upper-outer quadrant of right breast in female, estrogen receptor positive ([place]) 01/19/2016  . Bone metastases ([place]) 11/22/2014  . Perennial allergic rhinitis with a nonallergic component 02/12/2014  . Metastatic breast cancer ([place]) 01/23/2014  . History of  breast cancer 01/11/2014  . Chronic rhinitis 09/17/2013  . Hilar adenopathy 05/12/2013  . Abdominal pain 05/07/2011  . HTN (hypertension) 06/09/2010  . BACK PAIN 04/10/2010  . Dyslipidemia 03/03/2010  . Cerebrovascular disease 03/03/2010  . DIVERTICULOSIS, COLON 03/03/2010   PCP:  Isaac Bliss, Rayford Halsted, MD Pharmacy:   [facility] [place], [address] [place] [address] Phone: [phone] Fax: [phone]     Social Determinants of Health (SDOH) Interventions    Readmission Risk Interventions No flowsheet data found.

## 2019-09-04 NOTE — Evaluation (Signed)
Occupational Therapy Evaluation Patient Details Name: Erica Young MRN: [id number] DOB: 15-May-1940 Today's Date: 09/04/2019    History of Present Illness Pt admitted 2* intractable back pin 2* acute vertebral L1 compression fx in the setting of osseous metastasis.  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 Patient requires min A with multimodal cues for log roll technique and min A x2 hand held assist to stand edge of bed. Patient tolerate approx 10 seconds standing before having to return to sitting due to pain. Attempt multiple times to educate patient on benefits of rolling walker to alleviate pressure in LEs by weight bearing through arms however patient is resistant. Believes her L hip/back pain started from using her walker. Will continue to follow.    Follow Up Recommendations  SNF    Equipment Recommendations  Other (comment)(defer to next venue)       Precautions / Restrictions Precautions Precautions: Fall Restrictions Weight Bearing Restrictions: No      Mobility Bed Mobility Overal bed mobility: Needs Assistance Bed Mobility: Rolling;Sidelying to Sit;Sit to Sidelying Rolling: Min assist Sidelying to sit: Min assist     Sit to sidelying: Min assist;+2 for physical assistance;+2 for safety/equipment General bed mobility comments: multi-modal cues for log roll technique  Transfers Overall transfer level: Needs assistance Equipment used: 2 person hand held assist Transfers: Sit to/from Stand Sit to Stand: Min assist;+2 physical assistance;+2 safety/equipment;From elevated surface         General transfer comment: resistant to try and use rolling walker, requires x2 HHA to power up to standing and weight shift to gain balance    Balance Overall balance assessment: Needs assistance Sitting-balance support:  Bilateral upper extremity supported Sitting balance-Leahy Scale: Fair     Standing balance support: Bilateral upper extremity supported Standing balance-Leahy Scale: Poor                             ADL either performed or assessed with clinical judgement   ADL Overall ADL's : Needs assistance/impaired Eating/Feeding: Set up;Bed level   Grooming: Set up;Bed level   Upper Body Bathing: Minimal assistance;Bed level;Sitting   Lower Body Bathing: Maximal assistance;Total assistance;Sitting/lateral leans;Bed level   Upper Body Dressing : Minimal assistance;Bed level;Sitting   Lower Body Dressing: Total assistance;Sitting/lateral leans;Bed level Lower Body Dressing Details (indicate cue type and reason): due to pain Toilet Transfer: Minimal assistance;+2 for physical assistance;+2 for safety/equipment(hand held assist) Toilet Transfer Details (indicate cue type and reason): simulated with mobility, unable to tolerate standing for more than ~10 seconds Toileting- Clothing Manipulation and Hygiene: Total assistance;Bed level       Functional mobility during ADLs: Minimal assistance;+2 for safety/equipment;+2 for physical assistance                    Pertinent Vitals/Pain Pain Assessment: Faces Faces Pain Scale: Hurts whole lot Pain Location: L back pain with standing Pain Descriptors / Indicators: Grimacing;Guarding;Moaning Pain Intervention(s): Limited activity within patient's tolerance     Hand Dominance Right   Extremity/Trunk Assessment Upper Extremity Assessment Upper Extremity Assessment: Generalized weakness   Lower Extremity Assessment Lower Extremity Assessment: Defer to PT evaluation   Cervical / Trunk Assessment Cervical / Trunk Assessment: Kyphotic   Communication Communication Communication: No difficulties   Cognition Arousal/Alertness: Awake/alert Behavior During Therapy:  WFL for tasks assessed/performed;Anxious Overall Cognitive  Status: Within Functional Limits for tasks assessed                                 General Comments: patient does perseverate on pain and how it initially started on R side, now on the L "I don't know why"               Home Living Family/patient expects to be discharged to:: Private residence Living Arrangements: Alone   Type of Home: Other(Comment)(townhouse) Home Access: Stairs to enter Entrance Stairs-Number of Steps: 2 Entrance Stairs-Rails: None Home Layout: One level     Bathroom Shower/Tub: Occupational psychologist: Handicapped height     Home Equipment: Grab bars - tub/shower;Shower seat          Prior Functioning/Environment Level of Independence: Independent        Comments: reports using walker x1 month due to back pain but recently stopped using it because she feels it caused her L hip pain        OT Problem List: Decreased strength;Decreased activity tolerance;Impaired balance (sitting and/or standing);Decreased safety awareness;Decreased knowledge of use of DME or AE;Decreased knowledge of precautions;Pain      OT Treatment/Interventions: Self-care/ADL training;Therapeutic exercise;Energy conservation;DME and/or AE instruction;Therapeutic activities;Patient/family education;Balance training    OT Goals(Current goals can be found in the care plan section) Acute Rehab OT Goals Patient Stated Goal: Less pain OT Goal Formulation: With patient Time For Goal Achievement: 09/18/19 Potential to Achieve Goals: Good  OT Frequency: Min 2X/week           Co-evaluation PT/OT/SLP Co-Evaluation/Treatment: Yes Reason for Co-Treatment: To address functional/ADL transfers   OT goals addressed during session: ADL's and self-care      AM-PAC OT "6 Clicks" Daily Activity     Outcome Measure Help from another person eating meals?: None Help from another person taking care of personal grooming?: A Little Help from another person  toileting, which includes using toliet, bedpan, or urinal?: A Lot Help from another person bathing (including washing, rinsing, drying)?: A Lot Help from another person to put on and taking off regular upper body clothing?: A Little Help from another person to put on and taking off regular lower body clothing?: Total 6 Click Score: 15   End of Session Equipment Utilized During Treatment: Gait belt Nurse Communication: Mobility status  Activity Tolerance: Patient limited by pain Patient left: in bed;with call bell/phone within reach  OT Visit Diagnosis: Unsteadiness on feet (R26.81);Muscle weakness (generalized) (M62.81);Pain Pain - Right/Left: Left Pain - part of body: Hip(back)                Time: 6720-9470 OT Time Calculation (min): 21 min Charges:  OT General Charges $OT Visit: 1 Visit OT Evaluation $OT Eval Moderate Complexity: [place] OT Pager: [place] 09/04/2019, 2:10 PM

## 2019-09-04 NOTE — Plan of Care (Signed)

## 2019-09-04 NOTE — Evaluation (Signed)
Physical Therapy Evaluation Patient Details Name: Erica Young MRN: [id number] DOB: 14-May-1940 Today's Date: 09/04/2019   History of Present Illness  Pt admitted 2* intractable back pin 2* acute vertebral L1 compression fx in the setting of osseous metastasis.  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  This date, pt able to achieve standing only but unable to ambulate 2* pain .  Pt would benefit from follow up rehab at SNF level to maximize IND and safety prior to return home ALONE.   Follow Up Recommendations SNF    Equipment Recommendations  None recommended by PT    Recommendations for Other Services       Precautions / Restrictions Precautions Precautions: Fall Restrictions Weight Bearing Restrictions: No      Mobility  Bed Mobility Overal bed mobility: Needs Assistance Bed Mobility: Rolling;Sidelying to Sit;Sit to Sidelying Rolling: Min assist Sidelying to sit: Min assist     Sit to sidelying: Min assist;+2 for physical assistance;+2 for safety/equipment General bed mobility comments: multi-modal cues for log roll technique  Transfers Overall transfer level: Needs assistance Equipment used: 2 person hand held assist Transfers: Sit to/from Stand Sit to Stand: Min assist;Mod assist;+2 physical assistance;+2 safety/equipment;From elevated surface         General transfer comment: Pt resistant to using RW; two person HHA to bring wt up and fwd and to balance in standing  Ambulation/Gait             General Gait Details: Pt stood with HHA of two ~45 seconds and returned to sitting stating standing was too painful  Stairs            Wheelchair Mobility    Modified Rankin (Stroke Patients Only)       Balance Overall balance  assessment: Needs assistance Sitting-balance support: Bilateral upper extremity supported Sitting balance-Leahy Scale: Fair     Standing balance support: Bilateral upper extremity supported Standing balance-Leahy Scale: Poor                               Pertinent Vitals/Pain Pain Assessment: Faces Faces Pain Scale: Hurts whole lot Pain Location: L back pain with standing Pain Descriptors / Indicators: Grimacing;Guarding;Moaning Pain Intervention(s): Limited activity within patient's tolerance;Monitored during session;Premedicated before session    Home Living Family/patient expects to be discharged to:: Unsure Living Arrangements: Alone                    Prior Function Level of Independence: Independent               Hand Dominance        Extremity/Trunk Assessment   Upper Extremity Assessment Upper Extremity Assessment: Defer to OT evaluation    Lower Extremity Assessment Lower Extremity Assessment: Generalized weakness    Cervical / Trunk Assessment Cervical / Trunk Assessment: Kyphotic  Communication   Communication: Prefers language other than [place];No difficulties  Cognition Arousal/Alertness: Awake/alert Behavior During Therapy: WFL for tasks assessed/performed;Anxious Overall Cognitive Status: Within Functional Limits for tasks assessed  General Comments      Exercises     Assessment/Plan    PT Assessment Patient needs continued PT services  PT Problem List Decreased strength;Decreased activity tolerance;Decreased balance;Decreased mobility;Decreased knowledge of use of DME;Decreased safety awareness;Pain;Decreased knowledge of precautions       PT Treatment Interventions DME instruction;Gait training;Functional mobility training;Therapeutic activities;Therapeutic exercise;Stair training;Balance training;Patient/family education    PT Goals (Current goals can be  found in the Care Plan section)  Acute Rehab PT Goals Patient Stated Goal: Less pain PT Goal Formulation: With patient Time For Goal Achievement: 09/18/19 Potential to Achieve Goals: Fair    Frequency Min 3X/week   Barriers to discharge Decreased caregiver support Pt lives alone    Co-evaluation PT/OT/SLP Co-Evaluation/Treatment: Yes Reason for Co-Treatment: For patient/therapist safety;To address functional/ADL transfers PT goals addressed during session: Mobility/safety with mobility OT goals addressed during session: ADL's and self-care       AM-PAC PT "6 Clicks" Mobility  Outcome Measure Help needed turning from your back to your side while in a flat bed without using bedrails?: A Little Help needed moving from lying on your back to sitting on the side of a flat bed without using bedrails?: A Little Help needed moving to and from a bed to a chair (including a wheelchair)?: A Lot Help needed standing up from a chair using your arms (e.g., wheelchair or bedside chair)?: A Lot Help needed to walk in hospital room?: A Lot Help needed climbing 3-5 steps with a railing? : Total 6 Click Score: 13    End of Session Equipment Utilized During Treatment: Gait belt Activity Tolerance: Patient limited by pain Patient left: in bed;with call bell/phone within reach;with bed alarm set Nurse Communication: Mobility status;Patient requests pain meds PT Visit Diagnosis: Muscle weakness (generalized) (M62.81);Difficulty in walking, not elsewhere classified (R26.2);Pain Pain - part of body: (back)    Time: 4158-3094 PT Time Calculation (min) (ACUTE ONLY): 17 min   Charges:   PT Evaluation $PT Eval Low Complexity: 1 Low          [place] Pager [phone] Office [phone]   Ala Kratz 09/04/2019, 10:55 AM

## 2019-09-04 NOTE — Progress Notes (Signed)
PROGRESS NOTE    Erica Young  YHC:[id number] DOB: [birth year] DOA: 09/02/2019 PCP: Isaac Bliss, Rayford Halsted, MD    Brief Narrative:  Patient was admitted to the hospital with a working diagnosis of intractable back pain due to acute vertebral L1 compression fracture in the setting of osseous metastasis,  79 year old female who presented with low back pain.  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  Patient currently receiving radiation therapy for bony metastasis.  Her back pain has been worse over the last 2 weeks, 10 out of 10 in intensity, to the point where patient was having difficulty ambulating.  On her initial physical examination blood pressure 153/78, pulse rate 87, respirate 16, oxygen saturation 93%, her lungs were clear to auscultation bilaterally, heart S1-S2 present and rhythmic, abdomen soft, no lower extremity edema.  Lower extremity strength was intact. Sodium 135, potassium 3.4, chloride 97, glucose 108, BUN 8, creatinine 0.4, white count 4.1, hemoglobin 11.7, hematocrit 35.6, platelets 160.  SARS COVID-19 negative.  Urinalysis specific gravity 1.012, 0-5 white cells, 6-10 red cells.  Lumbar spine x-rays with pathologic L1 compression fracture with 50% height loss, new since 07/14/2019.  L5 metastatic disease.  Patient placed on analgesics regimen with improvement of her back pain, but not yet back to baseline, continue to be very weak and deconditioned.   PT/OT have recommended SNF.    Assessment & Plan:   Principal Problem:   Vertebral compression fracture (HCC) Active Problems:   HTN (hypertension)   Metastatic breast cancer ([place])   Bone metastases ([place])   Hypoxia   Compression fracture of L1 lumbar vertebra ([place])   1. Acute L1 compression fracture with intractable pain. Improve pain but not yet back to  baseline.  Continue pain control with:  1. Oxycodone ER 10 mg bid 2. Scheduled acetaminophen 650 mg q 6  3. Scheduled Ibuprofen 400 mg tid  4. PRN IV morphine for break through severe pain and oxycodone IR for moderate pain.   Patient continue to be very weak and deconditioned, will need SNF. Her symptoms are improving with medical therapy will hold on kyphoplasty referral for now.   2. Dehydration with hypokalemia and dyspepsia. Renal function with serum cr at 0,59 with K at 4,3 and serum Na at 133. Continue to have poor oral intake.   Will continue gentle hydration with balanced electrolyte solutions with dextrose D5 LR at 50 ml per H. Advance diet to regular and will consult nutrition. Continue with pantoprazole.   3. Metastatic breast cancer/ bont mets. Continue with radiation therapy. On exemestane.   Follow up with Dr. Jana Hakim as outpatient.   4. HTN. Blood pressure 111/65 and 125/70 mmHg, will continue with carvedilol and losartan.   5. Anxiety. PRN alprazolam.   6. Transient hypoxemia. Likely due to atelectasis, will add incentive spirometer and continue oxymetry. Resolved.   Status is: Inpatient  Remains inpatient appropriate because:IV treatments appropriate due to intensity of illness or inability to take PO   Dispo: The patient is from: Home              Anticipated d/c is to: SNF              Anticipated d/c date is: 2 days              Patient currently is not medically stable to d/c.  DVT prophylaxis: Enoxaparin   Code Status:   dnr   Family Communication:  No family at the bedside      Nutrition Status: Nutrition Problem: Inadequate oral intake Etiology: cancer and cancer related treatments(stage IV breast cancer with mets to liver and bone) Signs/Symptoms: (meal completion 25-50%) Interventions: Ensure Enlive (each supplement provides 350kcal and 20 grams of protein), Magic cup    Consultants:   Oncology    Subjective: Patient  reports improvement in back pain but not yet back to baseline, her po intake has improved, but continue to be very weak and deconditioned.   Objective: Vitals:   09/03/19 0955 09/03/19 1405 09/03/19 2007 09/04/19 0547  BP: 125/64 138/69 125/70 111/65  Pulse: 92 74 71 76  Resp: 17 18 18 16   Temp: 98 F (36.7 C) 97.7 F (36.5 C) (!) 97.5 F (36.4 C) 97.7 F (36.5 C)  TempSrc:   Oral Oral  SpO2: 95% 96% 97% 94%  Weight:      Height:        Intake/Output Summary (Last 24 hours) at 09/04/2019 1250 Last data filed at 09/04/2019 1020 Gross per 24 hour  Intake 1705.9 ml  Output --  Net 1705.9 ml   Filed Weights   09/02/19 2326  Weight: 52.2 kg    Examination:   General: Not in pain or dyspnea, deconditioned  Neurology: Awake and alert, non focal  E ENT: mild pallor, no icterus, oral mucosa moist Cardiovascular: No JVD. S1-S2 present, rhythmic, no gallops, rubs, or murmurs. No lower extremity edema. Pulmonary: positive breath sounds bilaterally, adequate air movement, no wheezing, rhonchi or rales. Gastrointestinal. Abdomen with, no organomegaly, non tender, no rebound or guarding Skin. No rashes Musculoskeletal: no joint deformities     Data Reviewed: I have personally reviewed following labs and imaging studies  CBC: Recent Labs  Lab 09/02/19 2351 09/03/19 0000  WBC 4.1  --   NEUTROABS 2.8  --   HGB 11.7* 11.9*  HCT 35.6* 35.0*  MCV 95.4  --   PLT 160  --    Basic Metabolic Panel: Recent Labs  Lab 09/02/19 2351 09/03/19 0000 09/04/19 0320  NA  --  135 133*  K  --  3.4* 4.3  CL  --  97* 104  CO2  --   --  24  GLUCOSE  --  108* 117*  BUN  --  8 12  CREATININE  --  0.40* 0.59  CALCIUM  --   --  8.4*  MG 1.9  --   --    GFR: Estimated Creatinine Clearance: 43 mL/min (by C-G formula based on SCr of 0.59 mg/dL). Liver Function Tests: No results for input(s): AST, ALT, ALKPHOS, BILITOT, PROT, ALBUMIN in the last 168 hours. No results for input(s): LIPASE,  AMYLASE in the last 168 hours. No results for input(s): AMMONIA in the last 168 hours. Coagulation Profile: No results for input(s): INR, PROTIME in the last 168 hours. Cardiac Enzymes: No results for input(s): CKTOTAL, CKMB, CKMBINDEX, TROPONINI in the last 168 hours. BNP (last 3 results) No results for input(s): PROBNP in the last 8760 hours. HbA1C: No results for input(s): HGBA1C in the last 72 hours. CBG: No results for input(s): GLUCAP in the last 168 hours. Lipid Profile: No results for input(s): CHOL, HDL, LDLCALC, TRIG, CHOLHDL, LDLDIRECT in the last 72 hours. Thyroid Function Tests: No results for input(s): TSH, T4TOTAL, FREET4, T3FREE, THYROIDAB in the last 72 hours. Anemia Panel: No results for input(s): VITAMINB12,  FOLATE, FERRITIN, TIBC, IRON, RETICCTPCT in the last 72 hours.    Radiology Studies: I have reviewed all of the imaging during this hospital visit personally     Scheduled Meds: . acetaminophen  650 mg Oral Q6H  . carvedilol  12.5 mg Oral BID WC  . cholecalciferol  1,000 Units Oral Daily  . docusate sodium  100 mg Oral Daily  . enoxaparin (LOVENOX) injection  40 mg Subcutaneous Q24H  . exemestane  25 mg Oral QPC breakfast  . feeding supplement (ENSURE ENLIVE)  237 mL Oral BID BM  . ibuprofen  400 mg Oral TID  . loratadine  10 mg Oral Daily  . losartan  12.5 mg Oral Daily  . oxyCODONE  10 mg Oral Q12H  . pantoprazole  40 mg Oral Daily   Continuous Infusions: . dextrose 5% lactated ringers 75 mL/hr at 09/04/19 0400     LOS: 1 day        Marcina Kinnison Gerome Apley, MD

## 2019-09-04 NOTE — Progress Notes (Signed)
Manufacturing engineer [facility])  Hospital Liaison Note  This RN was made aware bythe [facility] [place], SW, that pt was requesting contact from [facility].  Pt had a scheduled admission visit for 09/05/19.  This RN met with pt at bedside to initiate education related to hospice philosophy and services and to answer any questions.  Pt verbalized understanding of information given.  Pt requests f/u visit by [facility] hospital liaison as her plans after d/c evolve.  This RN left contact number for [facility] on call and agreed with plan to make f/u visit on Monday, 09/07/19.  Domenic Moras, BSN, [facility] (in [place], [place]) [phone] (24h on call)

## 2019-09-04 NOTE — Patient Outreach (Signed)
[facility]) Care Management  09/04/2019  Erica Young January 31, 1941 [id number]   Unsuccessful [facility] outreach to complex care patient (ref[facility] by [facility] MSN-Ed, RN, BSN [facility]) coordinator)  Plus  Recent MD referral (Dr Isaac Bliss per on 08/19/19 for active [facility] patient  for consult cancer with metsto her spineC50.411,Z17.0 (ICD-10-CM) - Malignant neoplasm of upper-outer quadrant of right breast in female, estrogen receptor positive  Unsuccessful [facility] RN CM outreachto Erica Young  No answer. [facility] RN CM left HIPAA [facility] Portability and Accountability Act) compliant voicemail message along with CM's contact info.   Plan: [facility] RN CM scheduled this patient for another call attempt within 4 -7 business days  Erica Young L. Lavina Hamman, RN, BSN, [place] Coordinator Office number [phone] Mobile number [phone]  Main [facility] number [phone] Fax number [phone]

## 2019-09-04 NOTE — Progress Notes (Signed)
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  Patient laying quietly in bed, dressed in long sleeve top and bottoms from home, RN in room. Patient endorses decreased appetite, states that she just doesn't feel hungry. Patient shares that her husband passed away 4 months ago, and she has no motivation and feels depressed. Patient reports working with a hospice grief counselor and enjoys therapy sessions. She endorses back pain and unable to stand up for long periods of time to cook meals. RD suggested ready made meals and soups that are easily warmed in the microwave. She reports drinking nutrition supplements, usually once daily. RD encouraged patient to increase supplement intake, patient reports they are too expensive. Provided patient with coupons and suggested trying off brands that offer similar nutritional value and more affordable.  Medications reviewed  and include: Vit D3, Oxycontin, Protonix IVF: D5 in lactated ringers @ 50 ml/hr Labs: Na 133 (L)  NUTRITION - FOCUSED PHYSICAL EXAM:    Most Recent Value  Orbital Region  Moderate depletion  Upper Arm Region  Moderate depletion  Thoracic and Lumbar Region  Unable to assess  Buccal Region  Severe depletion  Temple Region  Moderate depletion  Clavicle Bone Region  Moderate depletion  Clavicle and Acromion Bone Region  Moderate depletion  Scapular Bone Region  Unable to assess  Dorsal Hand  Moderate depletion  Patellar Region  Unable to assess [wearing pajama pants]  Anterior Thigh Region  Unable to assess  Posterior Calf Region  Unable to assess  Edema (RD Assessment)  None  Hair  Reviewed  Eyes  Reviewed  Mouth  Reviewed  Skin  Reviewed  Nails  Reviewed     Diet Order:   Diet Order            Diet regular Room service appropriate? Yes; Fluid consistency: Thin  Diet effective now              EDUCATION NEEDS:   Education needs have been addressed  Skin:  Skin Assessment: Reviewed RN Assessment  Last BM:  5/7  Height:   Ht Readings from Last 1 Encounters:  09/02/19 5\' 1"  (1.549 m)    Weight:   Wt Readings from Last 1 Encounters:  09/02/19 52.2 kg    BMI:  Body mass index is 21.73 kg/m.  Estimated Nutritional Needs:   Kcal:  9485-4627  Protein:  80-90  Fluid:  >/= 1.5 L/day    Lajuan Lines, RD, LDN Clinical Nutrition After Hours/Weekend Pager # in [place]

## 2019-09-05 DIAGNOSIS — E440 Moderate protein-calorie malnutrition: Secondary | ICD-10-CM | POA: Insufficient documentation

## 2019-09-05 LAB — BASIC METABOLIC PANEL
Anion gap: 7 (ref 5–15)
BUN: 10 mg/dL (ref 8–23)
CO2: 26 mmol/L (ref 22–32)
Calcium: 8.1 mg/dL — ABNORMAL LOW (ref 8.9–10.3)
Chloride: 102 mmol/L (ref 98–111)
Creatinine, Ser: 0.52 mg/dL (ref 0.44–1.00)
GFR calc Af Amer: >60 mL/min (ref >60)
GFR calc non Af Amer: >60 mL/min (ref >60)
Glucose, Bld: 103 mg/dL — ABNORMAL HIGH (ref 70–99)
Potassium: 4.1 mmol/L (ref 3.5–5.1)
Sodium: 135 mmol/L (ref 135–145)

## 2019-09-05 MED ORDER — CYCLOBENZAPRINE HCL 5 MG PO TABS
5.0000 mg | ORAL_TABLET | Freq: Three times a day (TID) | ORAL | Status: DC
  Administered 2019-09-05 – 2019-09-13 (×23): 5 mg via ORAL
  Filled 2019-09-05 (×24): qty 1

## 2019-09-05 MED ORDER — DICLOFENAC SODIUM 1 % EX GEL
2.0000 g | Freq: Four times a day (QID) | CUTANEOUS | Status: DC
  Administered 2019-09-05 – 2019-09-12 (×26): 2 g via TOPICAL
  Filled 2019-09-05: qty 100

## 2019-09-05 NOTE — Progress Notes (Signed)
Physical Therapy Treatment Patient Details Name: Erica Young MRN: [id number] DOB: 22-Aug-1940 Today's Date: 09/05/2019    History of Present Illness Pt admitted 2* intractable back pin 2* acute vertebral L1 compression fx in the setting of osseous metastasis.  Pt with hx of Stage IV breast CA with liver mets, CVA, and systolic heart failure    PT Comments    Pt ambulated 12' x 2 with RW, no loss of balance, distance limited by L low back pain. Min A for bed mobility and transfers. Continue to recommend SNF as pt lives alone.   Follow Up Recommendations  SNF     Equipment Recommendations  None recommended by PT    Recommendations for Other Services       Precautions / Restrictions Precautions Precautions: Fall;Back Precaution Booklet Issued: Yes (comment) Restrictions Weight Bearing Restrictions: No    Mobility  Bed Mobility Overal bed mobility: Needs Assistance Bed Mobility: Rolling;Sidelying to Sit Rolling: Min assist Sidelying to sit: Min assist       General bed mobility comments: multi-modal cues for log roll technique  Transfers Overall transfer level: Needs assistance Equipment used: Rolling walker (2 wheeled) Transfers: Sit to/from Stand Sit to Stand: Min assist         General transfer comment: min A to power up, VCs hand placement  Ambulation/Gait   Gait Distance (Feet): 24 Feet Assistive device: Rolling walker (2 wheeled) Gait Pattern/deviations: Step-through pattern;Decreased stride length Gait velocity: decr   General Gait Details: pt ambulated from bed to bathroom with RW, then to recliner. 12' x 2   Stairs             Wheelchair Mobility    Modified Rankin (Stroke Patients Only)       Balance Overall balance assessment: Needs assistance Sitting-balance support: Bilateral upper extremity supported Sitting balance-Leahy Scale: Fair     Standing balance support: Bilateral upper extremity supported Standing balance-Leahy  Scale: Poor                              Cognition Arousal/Alertness: Awake/alert Behavior During Therapy: WFL for tasks assessed/performed;Anxious Overall Cognitive Status: Within Functional Limits for tasks assessed                                        Exercises      General Comments        Pertinent Vitals/Pain Pain Assessment: Faces Faces Pain Scale: Hurts even more Pain Location: L back pain with standing Pain Descriptors / Indicators: Grimacing;Guarding Pain Intervention(s): Limited activity within patient's tolerance;Monitored during session;Premedicated before session;Relaxation(instructed pt in slow, deep breathing)    Home Living                      Prior Function            PT Goals (current goals can now be found in the care plan section) Acute Rehab PT Goals Patient Stated Goal: Less pain PT Goal Formulation: With patient Time For Goal Achievement: 09/18/19 Potential to Achieve Goals: Fair Progress towards PT goals: Progressing toward goals    Frequency    Min 3X/week      PT Plan Current plan remains appropriate    Co-evaluation PT/OT/SLP Co-Evaluation/Treatment: Yes            AM-PAC PT "6 Clicks" Mobility  Outcome Measure  Help needed turning from your back to your side while in a flat bed without using bedrails?: A Little Help needed moving from lying on your back to sitting on the side of a flat bed without using bedrails?: A Little Help needed moving to and from a bed to a chair (including a wheelchair)?: A Little Help needed standing up from a chair using your arms (e.g., wheelchair or bedside chair)?: A Little Help needed to walk in hospital room?: A Little Help needed climbing 3-5 steps with a railing? : A Lot 6 Click Score: 17    End of Session Equipment Utilized During Treatment: Gait belt Activity Tolerance: Patient limited by pain Patient left: in bed;with call bell/phone within  reach;with bed alarm set Nurse Communication: Mobility status;Patient requests pain meds PT Visit Diagnosis: Muscle weakness (generalized) (M62.81);Difficulty in walking, not elsewhere classified (R26.2);Pain Pain - part of body: (back)     Time: 4136-4383 PT Time Calculation (min) (ACUTE ONLY): 27 min  Charges:  $Gait Training: 8-22 mins $Therapeutic Activity: 8-22 mins                     Blondell Reveal Kistler PT 09/05/2019  Acute Rehabilitation Services Pager [phone] Office [phone]

## 2019-09-05 NOTE — Progress Notes (Signed)
PROGRESS NOTE    Erica Young  YBO:[id number] DOB: May 18, 1940 DOA: 09/02/2019 PCP: Isaac Bliss, Rayford Halsted, MD    Brief Narrative:  Patient was admitted to the hospital with a working diagnosis of intractable back pain due to acute vertebralL1compression fracture in the setting of osseous metastasis,  79 year old female who presented with low back pain. 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 Patient currently receiving radiation therapy for bony metastasis. Her back pain has been worse over the last 2 weeks, 10 out of 10 in intensity, to the point where patient was having difficulty ambulating. On her initial physical examination blood pressure 153/78, pulse rate 87, respirate 16, oxygen saturation 93%, her lungs wereclear to auscultation bilaterally, heart S1-S2 present and rhythmic, abdomen soft, no lower extremity edema.Lower extremity strength was intact. Sodium 135, potassium 3.4, chloride 97, glucose 108, BUN 8, creatinine 0.4, white count 4.1, hemoglobin 11.7, hematocrit 35.6, platelets 160. SARS COVID-19 negative. Urinalysis specific gravity 1.012, 0-5 white cells, 6-10 red cells. Lumbar spine x-rays with pathologic L1 compression fracture with 50% height loss, new since 07/14/2019. L5 metastatic disease.  Patient placed on analgesics regimen with improvement of her back pain, but not yet back to baseline, continue to be very weak and deconditioned.   PT/OT have recommended SNF.   Her symptoms are improving with medical therapy will hold on kyphoplasty referral for now.   Assessment & Plan:   Principal Problem:   Vertebral compression fracture (HCC) Active Problems:   HTN (hypertension)   Metastatic breast cancer (HCC)   Bone metastases (HCC)   Hypoxia   Compression fracture of L1 lumbar vertebra (HCC)  Malnutrition of moderate degree   1. Acute L1 compression fracture with intractable pain. Improve pain but not yet back to baseline, analgesics help but continue to have episodic severe pain, mainly with movement.  Continue pain control with:  1. Oxycodone ER 10 mg bid 2. Scheduled acetaminophen 650 mg q 6  3. Scheduled Ibuprofen 400 mg tid  4. PRN IV morphine for break through severe pain and oxycodone IR for moderate pain.  5. Will add topical diclofenac and tid flexeril.   Continue physical and occupational therapy. Out of bed to chair tid with meals. Patient will need SNF.   2. Dehydration with hypokalemia and dyspepsia.  Stable renal function with serum cr at 0,52 with K at 4,1 and serum bicarbonate at 26.  Continue pantoprazole and continue to encourage po intake. Will hold on IV fluids for now.   3. Metastatic breast cancer/ bont mets. SP radiation therapy. Continue with exemestane.   Follow up with Dr. Jana Hakim as outpatient.   4. HTN. Continue with carvedilol and losartan for blood pressure control.   5. Anxiety. Continue with PRN alprazolam.   6. Transient hypoxemia. Likely due to atelectasis, will add incentive spirometer and continue oxymetry.Resolved  7. Moderate calorie protein malnutrition. Continue with nutritional supplements. Patient will need SNF at discharge.   Status is: Inpatient  Remains inpatient appropriate because:IV treatments appropriate due to intensity of illness or inability to take PO   Dispo: The patient is from: Home              Anticipated d/c is to: SNF              Anticipated d/c date is: 2 days  Patient currently is not medically stable to d/c.        DVT prophylaxis: Enoxaparin   Code Status:   full  Family Communication:  I spoke over the phone with the patient's son about patient's  condition, plan of care, prognosis and all questions were addressed.     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 No nausea or vomiting,   Objective: Vitals:   09/04/19 0547 09/04/19 1316 09/04/19 2055 09/05/19 0530  BP: 111/65 (!) 117/58 136/72 116/60  Pulse: 76 65 79 73  Resp: 16 18 18 16   Temp: 97.7 F (36.5 C) (!) 97.5 F (36.4 C) 98.4 F (36.9 C) 97.7 F (36.5 C)  TempSrc: Oral  Oral Oral  SpO2: 94% 96% 95% 95%  Weight:      Height:        Intake/Output Summary (Last 24 hours) at 09/05/2019 1023 Last data filed at 09/05/2019 0600 Gross per 24 hour  Intake 2053.01 ml  Output --  Net 2053.01 ml   Filed Weights   09/02/19 2326  Weight: 52.2 kg    Examination:   General: Not in pain or dyspnea, deconditioned  Neurology: Awake and alert, non focal  E ENT: no pallor, no icterus, oral mucosa moist Cardiovascular: No JVD. S1-S2 present, rhythmic, no gallops, rubs, or murmurs. No lower extremity edema. Pulmonary: positive breath sounds bilaterally, adequate air movement, no wheezing, rhonchi or rales. Gastrointestinal. Abdomen with no organomegaly, non tender, no rebound or guarding Skin. No rashes Musculoskeletal: pain to palpation at the paraspinal muscle at the lumbar region.      Data Reviewed: I have personally reviewed following labs and imaging studies  CBC: Recent Labs  Lab 09/02/19 2351 09/03/19 0000  WBC 4.1  --   NEUTROABS 2.8  --   HGB 11.7* 11.9*  HCT 35.6* 35.0*  MCV 95.4  --   PLT 160  --    Basic Metabolic  Panel: Recent Labs  Lab 09/02/19 2351 09/03/19 0000 09/04/19 0320 09/05/19 0338  NA  --  135 133* 135  K  --  3.4* 4.3 4.1  CL  --  97* 104 102  CO2  --   --  24 26  GLUCOSE  --  108* 117* 103*  BUN  --  8 12 10   CREATININE  --  0.40* 0.59 0.52  CALCIUM  --   --  8.4* 8.1*  MG 1.9  --   --   --    GFR: Estimated Creatinine Clearance: 43 mL/min (by C-G formula based on SCr of 0.52 mg/dL). Liver Function Tests: No results for input(s): AST, ALT, ALKPHOS, BILITOT, PROT, ALBUMIN in the last 168 hours. No results for input(s): LIPASE, AMYLASE in the last 168 hours. No results for input(s): AMMONIA in the last 168 hours. Coagulation Profile: No results for input(s): INR, PROTIME in the last 168 hours. Cardiac Enzymes: No results for input(s): CKTOTAL, CKMB, CKMBINDEX, TROPONINI in the last 168 hours. BNP (last 3 results)  No results for input(s): PROBNP in the last 8760 hours. HbA1C: No results for input(s): HGBA1C in the last 72 hours. CBG: No results for input(s): GLUCAP in the last 168 hours. Lipid Profile: No results for input(s): CHOL, HDL, LDLCALC, TRIG, CHOLHDL, LDLDIRECT in the last 72 hours. Thyroid Function Tests: No results for input(s): TSH, T4TOTAL, FREET4, T3FREE, THYROIDAB in the last 72 hours. Anemia Panel: No results for input(s): VITAMINB12, FOLATE, FERRITIN, TIBC, IRON, RETICCTPCT in the last 72 hours.    Radiology Studies: I have reviewed all of the imaging during this hospital visit personally     Scheduled Meds: . acetaminophen  650 mg Oral Q6H  . carvedilol  12.5 mg Oral BID WC  . cholecalciferol  1,000 Units Oral Daily  . docusate sodium  100 mg Oral Daily  . enoxaparin (LOVENOX) injection  40 mg Subcutaneous Q24H  . exemestane  25 mg Oral QPC breakfast  . feeding supplement (ENSURE ENLIVE)  237 mL Oral TID BM  . ibuprofen  400 mg Oral TID  . loratadine  10 mg Oral Daily  . losartan  12.5 mg Oral Daily  . oxyCODONE  10 mg Oral Q12H  .  pantoprazole  40 mg Oral Daily   Continuous Infusions: . dextrose 5% lactated ringers 50 mL/hr at 09/05/19 0200     LOS: 2 days        Charvi Gammage Gerome Apley, MD

## 2019-09-06 MED ORDER — LIP MEDEX EX OINT
TOPICAL_OINTMENT | CUTANEOUS | Status: AC
  Filled 2019-09-06: qty 7

## 2019-09-06 MED ORDER — BISACODYL 10 MG RE SUPP
10.0000 mg | Freq: Once | RECTAL | Status: AC
  Administered 2019-09-06: 10 mg via RECTAL
  Filled 2019-09-06: qty 1

## 2019-09-06 MED ORDER — POLYETHYLENE GLYCOL 3350 17 G PO PACK: 17.0000 g | PACK | Freq: Two times a day (BID) | ORAL | Status: DC

## 2019-09-06 NOTE — Plan of Care (Signed)
Adequate for discharge.

## 2019-09-06 NOTE — Progress Notes (Signed)
PROGRESS NOTE    Erica Young  GUR:[id number] DOB: 1941-04-16 DOA: 09/02/2019 PCP: Isaac Bliss, Rayford Halsted, MD    Brief Narrative:  Patient was admitted to the hospital with a working diagnosis of intractable back pain due to acute vertebralL1compression fracture in the setting of osseous metastasis,  79 year old female who presented with low back pain. 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 Patient currently receiving radiation therapy for bony metastasis. Her back pain has been worse over the last 2 weeks, 10 out of 10 in intensity, to the point where patient was having difficulty ambulating. On her initial physical examination blood pressure 153/78, pulse rate 87, respirate 16, oxygen saturation 93%, her lungs wereclear to auscultation bilaterally, heart S1-S2 present and rhythmic, abdomen soft, no lower extremity edema.Lower extremity strength was intact. Sodium 135, potassium 3.4, chloride 97, glucose 108, BUN 8, creatinine 0.4, white count 4.1, hemoglobin 11.7, hematocrit 35.6, platelets 160. SARS COVID-19 negative. Urinalysis specific gravity 1.012, 0-5 white cells, 6-10 red cells. Lumbar spine x-rays with pathologic L1 compression fracture with 50% height loss, new since 07/14/2019. L5 metastatic disease.  Patient placed on analgesics regimen with improvement of her back pain, but not yet back to baseline, continue to be very weak and deconditioned.   PT/OT have recommended SNF.   Her symptoms are improving with medical therapy will hold on kyphoplasty referral for now.    Assessment & Plan:   Principal Problem:   Vertebral compression fracture (HCC) Active Problems:   HTN (hypertension)   Metastatic breast cancer (HCC)   Bone metastases (HCC)   Hypoxia   Compression fracture of L1 lumbar vertebra (HCC)  Malnutrition of moderate degree   1. Acute L1 compression fracture with intractable pain.Continue to improve back pain but not yet back to baseline.   Continue pain control with:  1. Oxycodone ER 10 mg bid 2. Scheduled acetaminophen 650 mg q 6  3. Scheduled Ibuprofen 400 mg tid  4. PRN IV morphine for break through severepainand oxycodone IR for moderate pain. (last dose of IV morphine 05/08 (am), no need for IR oxycodone.  5. Topical diclofenac and tid flexeril.   Patient continue to be very weak and deconditioned, plan for SNF.   2. Dehydration with hypokalemia and dyspepsia/ constipation. Patient tolerating po well, reports having constipation  Continue with pantoprazole for acid suppression and will add one dose of dulcolax suppository. Continue mobility per physical therapy.   3. Metastatic breast cancer/ bont mets.SPradiation therapy. Continue with exemestane.   Dr. Jana Hakim will follow as outpatient.  4. HTN.Blood pressure control withcarvedilol and losartan .  5. Anxiety.Tolerating well PRNalprazolam.   6. Transient hypoxemia.Likely due to atelectasis, will add incentive spirometer and continue oxymetry.Resolved  7. Moderate calorie protein malnutrition. On nutritional supplements, will need to continue physical therapy at SNF.   Status is: Inpatient  Remains inpatient appropriate because:Ongoing active pain requiring inpatient pain management   Dispo: The patient is from: Home              Anticipated d/c is to: SNF              Anticipated d/c date is: 1 day              Patient currently is medically stable to d/c. Patient needs SNF, home dc is unsafe due to generalized weakness.  DVT prophylaxis:  enoxaparin   Code Status:   dnr Family Communication:  No family at the bedside       Nutrition Status: Nutrition Problem: Moderate Malnutrition Etiology: chronic illness(stage IV breast cancer with mets to liver and  bone) Signs/Symptoms: energy intake < or equal to 75% for > or equal to 1 month, moderate muscle depletion, moderate fat depletion, severe fat depletion Interventions: Ensure Enlive (each supplement provides 350kcal and 20 grams of protein), Education, Other (Comment)(coupons for ONS)      Subjective: Patient continue to have improved back pain but not yet back to baseline, this am with significant constipation, no nausea or vomiting, no dyspnea.   Objective: Vitals:   09/05/19 0530 09/05/19 1540 09/05/19 2148 09/06/19 0515  BP: 116/60 (!) 146/70 135/70 118/70  Pulse: 73 76 80   Resp: 16 16 18 18   Temp: 97.7 F (36.5 C) 97.7 F (36.5 C) (!) 97.4 F (36.3 C) 97.7 F (36.5 C)  TempSrc: Oral  Oral Oral  SpO2: 95% 94% 94% 95%  Weight:      Height:        Intake/Output Summary (Last 24 hours) at 09/06/2019 1224 Last data filed at 09/06/2019 1223 Gross per 24 hour  Intake 770 ml  Output 1000 ml  Net -230 ml   Filed Weights   09/02/19 2326  Weight: 52.2 kg    Examination:   General: Not in pain or dyspnea, deconditioned  Neurology: Awake and alert, non focal  E ENT: mild pallor, no icterus, oral mucosa moist Cardiovascular: No JVD. S1-S2 present, rhythmic, no gallops, rubs, or murmurs. No lower extremity edema. Pulmonary: vesicular breath sounds bilaterally, adequate air movement, no wheezing, rhonchi or rales. Gastrointestinal. Abdomen with no organomegaly, non tender, no rebound or guarding Skin. No rashes Musculoskeletal: no joint deformities/ decreased tenderness to lower back palpation.      Data Reviewed: I have personally reviewed following labs and imaging studies  CBC: Recent Labs  Lab 09/02/19 2351 09/03/19 0000  WBC 4.1  --   NEUTROABS 2.8  --   HGB 11.7* 11.9*  HCT 35.6* 35.0*  MCV 95.4  --   PLT 160  --    Basic Metabolic Panel: Recent Labs  Lab 09/02/19 2351 09/03/19 0000 09/04/19 0320 09/05/19 0338  NA  --  135 133* 135  K  --  3.4* 4.3  4.1  CL  --  97* 104 102  CO2  --   --  24 26  GLUCOSE  --  108* 117* 103*  BUN  --  8 12 10   CREATININE  --  0.40* 0.59 0.52  CALCIUM  --   --  8.4* 8.1*  MG 1.9  --   --   --    GFR: Estimated Creatinine Clearance: 43 mL/min (by C-G formula based on SCr of 0.52 mg/dL). Liver Function Tests: No results for input(s): AST, ALT, ALKPHOS, BILITOT, PROT, ALBUMIN in the last 168 hours. No results for input(s): LIPASE, AMYLASE in the last 168 hours. No results for input(s): AMMONIA in the last 168 hours. Coagulation Profile: No results for input(s): INR, PROTIME in the last 168 hours. Cardiac Enzymes: No results for input(s): CKTOTAL, CKMB, CKMBINDEX, TROPONINI in the last 168 hours. BNP (last 3 results) No results for input(s): PROBNP in the last 8760 hours. HbA1C: No results for input(s): HGBA1C in the last 72 hours. CBG: No results for input(s): GLUCAP in the last 168 hours. Lipid Profile: No results for input(s):  CHOL, HDL, LDLCALC, TRIG, CHOLHDL, LDLDIRECT in the last 72 hours. Thyroid Function Tests: No results for input(s): TSH, T4TOTAL, FREET4, T3FREE, THYROIDAB in the last 72 hours. Anemia Panel: No results for input(s): VITAMINB12, FOLATE, FERRITIN, TIBC, IRON, RETICCTPCT in the last 72 hours.    Radiology Studies: I have reviewed all of the imaging during this hospital visit personally     Scheduled Meds: . acetaminophen  650 mg Oral Q6H  . carvedilol  12.5 mg Oral BID WC  . cholecalciferol  1,000 Units Oral Daily  . cyclobenzaprine  5 mg Oral TID  . diclofenac Sodium  2 g Topical QID  . docusate sodium  100 mg Oral Daily  . enoxaparin (LOVENOX) injection  40 mg Subcutaneous Q24H  . exemestane  25 mg Oral QPC breakfast  . feeding supplement (ENSURE ENLIVE)  237 mL Oral TID BM  . ibuprofen  400 mg Oral TID  . loratadine  10 mg Oral Daily  . losartan  12.5 mg Oral Daily  . oxyCODONE  10 mg Oral Q12H  . pantoprazole  40 mg Oral Daily  . polyethylene glycol  17  g Oral BID   Continuous Infusions:   LOS: 3 days        Duan Scharnhorst Gerome Apley, MD

## 2019-09-07 ENCOUNTER — Inpatient Hospital Stay (HOSPITAL_COMMUNITY): Payer: Medicare Other

## 2019-09-07 DIAGNOSIS — S32010G Wedge compression fracture of first lumbar vertebra, subsequent encounter for fracture with delayed healing: Secondary | ICD-10-CM

## 2019-09-07 LAB — PROTIME-INR
INR: 1 (ref 0.8–1.2)
Prothrombin Time: 12.4 seconds (ref 11.4–15.2)

## 2019-09-07 NOTE — Progress Notes (Addendum)
Physical Therapy Treatment Patient Details Name: Erica Young MRN: [id number] DOB: [birth year] Today's Date: 09/07/2019    History of Present Illness Pt admitted 2* intractable back pain 2* acute vertebral L1 compression fx in the setting of osseous metastasis.  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 She required some encouragement for OOB activity. She was only able to tolerate side steps along the beside. Total assist to don TLSO. Educated pt that she does not need to wear TLSO in the bed. Will continue to follow and progress activity as tolerate. Pt may possibly have an IR consult for kyphoplasty.    Follow Up Recommendations  SNF     Equipment Recommendations  None recommended by PT    Recommendations for Other Services       Precautions / Restrictions Precautions Precautions: Fall Precaution Comments: back for comfort Required Braces or Orthoses: Spinal Brace Spinal Brace: Thoracolumbosacral orthotic;Applied in sitting position Restrictions Weight Bearing Restrictions: No    Mobility  Bed Mobility Overal bed mobility: Needs Assistance Bed Mobility: Rolling;Sidelying to Sit;Sit to Sidelying Rolling: Min guard Sidelying to sit: Min assist     Sit to sidelying: Min assist General bed mobility comments: multi-modal cues for log roll technique. assist for trunk and LEs. increased time.  Transfers Overall transfer level: Needs assistance Equipment used: Rolling walker (2 wheeled) Transfers: Sit to/from Stand Sit to Stand: Min assist         General transfer comment: min A to power up, VCs hand placement. increased time.  Ambulation/Gait Ambulation/Gait assistance: Min assist; +2 safety/equipment    Assistive device: Rolling walker (2 wheeled)       General Gait Details: pt only able to tolerate side steps along bedside on today.   Stairs              Wheelchair Mobility    Modified Rankin (Stroke Patients Only)       Balance                                            Cognition Arousal/Alertness: Awake/alert Behavior During Therapy: WFL for tasks assessed/performed Overall Cognitive Status: Within Functional Limits for tasks assessed                                 General Comments: patient does perseverate on pain      Exercises      General Comments        Pertinent Vitals/Pain Pain Assessment: Faces Faces Pain Scale: Hurts even more Pain Location: L back pain with standing Pain Descriptors / Indicators: Grimacing;Guarding;Moaning Pain Intervention(s): Limited activity within patient's tolerance;Monitored during session;Repositioned    Home Living                      Prior Function            PT Goals (current goals can now be found in the care plan section) Progress towards PT goals: Progressing toward goals    Frequency    Min 3X/week      PT Plan Current plan remains appropriate    Co-evaluation  AM-PAC PT "6 Clicks" Mobility   Outcome Measure  Help needed turning from your back to your side while in a flat bed without using bedrails?: A Little Help needed moving from lying on your back to sitting on the side of a flat bed without using bedrails?: A Little Help needed moving to and from a bed to a chair (including a wheelchair)?: A Little Help needed standing up from a chair using your arms (e.g., wheelchair or bedside chair)?: A Little Help needed to walk in hospital room?: A Lot Help needed climbing 3-5 steps with a railing? : A Lot 6 Click Score: 16    End of Session Equipment Utilized During Treatment: Gait belt Activity Tolerance: Patient limited by pain Patient left: in bed;with call bell/phone within reach;with bed alarm set   PT Visit Diagnosis: Muscle weakness (generalized) (M62.81);Difficulty in walking, not  elsewhere classified (R26.2);Pain Pain - part of body: (back)     Time: 5894-8347 PT Time Calculation (min) (ACUTE ONLY): 25 min  Charges:  $Gait Training: 8-22 mins $Therapeutic Activity: 8-22 mins                         Doreatha Massed, PT Acute Rehabilitation

## 2019-09-07 NOTE — TOC Progression Note (Signed)
Transition of Care [facility]) - Progression Note    Patient Details  Name: Erica Young MRN: [id number] Date of Birth: 11-23-1940  Transition of Care [facility]) CM/SW Contact  Lennart Pall, LCSW Phone Number: 09/07/2019, 9:29 AM  Clinical Narrative:   Met with pt this morning to discuss SNF plan further.  Pt is very focused on her continued pain and is frustrated by this.  She does reluctantly agree to SNF.  She is concerned about Hospice coverage.  Hopefull someone from [place] will be able to visit with her today.  Will bring SNF bed search.    Expected Discharge Plan: [place] Barriers to Discharge: Continued Medical Work up  Expected Discharge Plan and Services Expected Discharge Plan: [place] arrangements for the past 2 months: Single Family Home                                       Social Determinants of Health (SDOH) Interventions    Readmission Risk Interventions No flowsheet data found.

## 2019-09-07 NOTE — Care Management Important Message (Signed)
Important Message  Patient Details IM Letter given to [place] Case Manager to present to the Patient Name: Erica Young MRN: [id number] Date of Birth: 1940/06/27   Medicare Important Message Given:        Kerin Salen 09/07/2019, 12:56 PM

## 2019-09-07 NOTE — Progress Notes (Signed)
Orthopedic Tech Progress Note Patient Details:  Erica Young 06-09-1940 [id number]  Patient ID: Erica Young, female   DOB: [birth year], 79 y.o.   MRN: [id number]   Erica Young 09/07/2019, 10:10 AMCalled [facility] for TLSO brace

## 2019-09-07 NOTE — Progress Notes (Signed)
Spoke with RN Joslyn Devon, informed her pt will be done tomorrow since we have no power.

## 2019-09-07 NOTE — Progress Notes (Signed)
Orthopedic Tech Progress Note Patient Details:  Erica Young 19-May-1940 [id number]  Patient ID: [name], female   DOB: [birth year], 79 y.o.   MRN: [id number]   [place] Pink 09/07/2019, 8:57 AMRouted brace order to [facility].

## 2019-09-07 NOTE — Progress Notes (Signed)
PROGRESS NOTE    Erica Young  RCV:[id number] DOB: October 12, 1940 DOA: 09/02/2019 PCP: Isaac Bliss, Rayford Halsted, MD   Brief Narrative:  79 year old with history of GERD, HTN, stage IV breast cancer with liver metastases, history of CVA, hypothyroidism, CHF presented with intractable low back pain diagnosed with acute vertebral L1 compression fracture in the setting of osseous metastases.   Assessment & Plan:   Principal Problem:   Vertebral compression fracture (HCC) Active Problems:   HTN (hypertension)   Metastatic breast cancer (HCC)   Bone metastases (HCC)   Hypoxia   Compression fracture of L1 lumbar vertebra (HCC)   Malnutrition of moderate degree  Acute L1 compression fracture with intractable low back pain Bony metastases from stage IV metastatic breast cancer Continue current pain regimen oxycodone ER 10 mg twice daily, scheduled Tylenol and ibuprofen.  Flexeril 3 times daily As needed IV morphine.  Topical diclofenac PT recommends SNF Discussed with IR, will obtain MRI lumbar without contrast to assess for need for kyphoplasty. Aggressive bowel regimen TLSO brace ordered  Intravascular dehydration with hypokalemia Constipation/dyspepsia Aggressive bowel regimen, IV fluids as needed.  Encourage oral intake  Bilateral atelectasis with transient hypoxemia Encourage use of incentive spirometer  Stage IV metastatic breast cancer/liver and bone Status post radiation, continue exemestane.  Follows with Dr. Jana Hakim  Moderate protein calorie malnutrition Encourage p.o. intake  Essential hypertension Coreg and losartan  Anxiety Xanax as needed  PT-recommending SNF  DVT prophylaxis: Lovenox Code Status: DNR Family Communication: None  Status is: Inpatient  Remains inpatient appropriate because:Ongoing active pain requiring inpatient pain management   Dispo: The patient is from: Home              Anticipated d/c is to: SNF              Anticipated d/c date  is: 1 day              Patient currently is not medically stable to d/c.  Still having quite a bit of lower back pain requiring IV pain medication.  Difficulty bearing weight.   Subjective: Still complaining of lower back pain.  Because of that she is having trouble using her walker.  Review of Systems Otherwise negative except as per HPI, including: General: Denies fever, chills, night sweats or unintended weight loss. Resp: Denies cough, wheezing, shortness of breath. Cardiac: Denies chest pain, palpitations, orthopnea, paroxysmal nocturnal dyspnea. GI: Denies abdominal pain, nausea, vomiting, diarrhea or constipation GU: Denies dysuria, frequency, hesitancy or incontinence MS: Denies muscle aches, joint pain or swelling Neuro: Denies headache, neurologic deficits (focal weakness, numbness, tingling), abnormal gait Psych: Denies anxiety, depression, SI/HI/AVH Skin: Denies new rashes or lesions ID: Denies sick contacts, exotic exposures, travel  Examination:  General exam: Mild distress due to lower back pain Respiratory system: Clear to auscultation. Respiratory effort normal. Cardiovascular system: S1 & S2 heard, RRR. No JVD, murmurs, rubs, gallops or clicks. No pedal edema. Gastrointestinal system: Abdomen is nondistended, soft and nontender. No organomegaly or masses felt. Normal bowel sounds heard. Central nervous system: Alert and oriented. No focal neurological deficits. Extremities: Symmetric 5 x 5 power. Skin: No rashes, lesions or ulcers Psychiatry: Judgement and insight appear normal. Mood & affect appropriate.     Objective: Vitals:   09/06/19 0515 09/06/19 1400 09/06/19 2111 09/07/19 0554  BP: 118/70 128/84 135/76 131/75  Pulse:  96 82 88  Resp: 18 16 18 16   Temp: 97.7 F (36.5 C) 97.6 F (36.4 C) 97.9 F (36.6  C) 97.6 F (36.4 C)  TempSrc: Oral Oral Oral Oral  SpO2: 95% 93% 93% 95%  Weight:      Height:        Intake/Output Summary (Last 24 hours) at  09/07/2019 0752 Last data filed at 09/07/2019 0554 Gross per 24 hour  Intake 890 ml  Output 400 ml  Net 490 ml   Filed Weights   09/02/19 2326  Weight: 52.2 kg     Data Reviewed:   CBC: Recent Labs  Lab 09/02/19 2351 09/03/19 0000  WBC 4.1  --   NEUTROABS 2.8  --   HGB 11.7* 11.9*  HCT 35.6* 35.0*  MCV 95.4  --   PLT 160  --    Basic Metabolic Panel: Recent Labs  Lab 09/02/19 2351 09/03/19 0000 09/04/19 0320 09/05/19 0338  NA  --  135 133* 135  K  --  3.4* 4.3 4.1  CL  --  97* 104 102  CO2  --   --  24 26  GLUCOSE  --  108* 117* 103*  BUN  --  8 12 10   CREATININE  --  0.40* 0.59 0.52  CALCIUM  --   --  8.4* 8.1*  MG 1.9  --   --   --    GFR: Estimated Creatinine Clearance: 43 mL/min (by C-G formula based on SCr of 0.52 mg/dL). Liver Function Tests: No results for input(s): AST, ALT, ALKPHOS, BILITOT, PROT, ALBUMIN in the last 168 hours. No results for input(s): LIPASE, AMYLASE in the last 168 hours. No results for input(s): AMMONIA in the last 168 hours. Coagulation Profile: No results for input(s): INR, PROTIME in the last 168 hours. Cardiac Enzymes: No results for input(s): CKTOTAL, CKMB, CKMBINDEX, TROPONINI in the last 168 hours. BNP (last 3 results) No results for input(s): PROBNP in the last 8760 hours. HbA1C: No results for input(s): HGBA1C in the last 72 hours. CBG: No results for input(s): GLUCAP in the last 168 hours. Lipid Profile: No results for input(s): CHOL, HDL, LDLCALC, TRIG, CHOLHDL, LDLDIRECT in the last 72 hours. Thyroid Function Tests: No results for input(s): TSH, T4TOTAL, FREET4, T3FREE, THYROIDAB in the last 72 hours. Anemia Panel: No results for input(s): VITAMINB12, FOLATE, FERRITIN, TIBC, IRON, RETICCTPCT in the last 72 hours. Sepsis Labs: No results for input(s): PROCALCITON, LATICACIDVEN in the last 168 hours.  Recent Results (from the past 240 hour(s))  Respiratory Panel by RT PCR (Flu A&B, Covid) - Nasopharyngeal Swab      Status: None   Collection Time: 09/02/19 11:51 PM   Specimen: Nasopharyngeal Swab  Result Value Ref Range Status   SARS Coronavirus 2 by RT PCR NEGATIVE NEGATIVE Final    Comment: (NOTE) SARS-CoV-2 target nucleic acids are NOT DETECTED. The SARS-CoV-2 RNA is generally detectable in upper respiratoy specimens during the acute phase of infection. The lowest concentration of SARS-CoV-2 viral copies this assay can detect is 131 copies/mL. A negative result does not preclude SARS-Cov-2 infection and should not be used as the sole basis for treatment or other patient management decisions. A negative result may occur with  improper specimen collection/handling, submission of specimen other than nasopharyngeal swab, presence of viral mutation(s) within the areas targeted by this assay, and inadequate number of viral copies (<131 copies/mL). A negative result must be combined with clinical observations, patient history, and epidemiological information. The expected result is Negative. Fact Sheet for Patients:  PinkCheek.be Fact Sheet for Healthcare Providers:  GravelBags.it This test is not yet  ap proved or cleared by the [place] and  has been authorized for detection and/or diagnosis of SARS-CoV-2 by FDA under an Emergency Use Authorization (EUA). This EUA will remain  in effect (meaning this test can be used) for the duration of the COVID-19 declaration under Section 564(b)(1) of the Act, 21 U.S.C. section 360bbb-3(b)(1), unless the authorization is terminated or revoked sooner.    Influenza A by PCR NEGATIVE NEGATIVE Final   Influenza B by PCR NEGATIVE NEGATIVE Final    Comment: (NOTE) The Xpert Xpress SARS-CoV-2/FLU/RSV assay is intended as an aid in  the diagnosis of influenza from Nasopharyngeal swab specimens and  should not be used as a sole basis for treatment. Nasal washings and  aspirates are unacceptable for  Xpert Xpress SARS-CoV-2/FLU/RSV  testing. Fact Sheet for Patients: PinkCheek.be Fact Sheet for Healthcare Providers: GravelBags.it This test is not yet approved or cleared by the [place] FDA and  has been authorized for detection and/or diagnosis of SARS-CoV-2 by  FDA under an Emergency Use Authorization (EUA). This EUA will remain  in effect (meaning this test can be used) for the duration of the  Covid-19 declaration under Section 564(b)(1) of the Act, 21  U.S.C. section 360bbb-3(b)(1), unless the authorization is  terminated or revoked. Performed at [facility], [place] [address] [place], [place] [address]          Radiology Studies: No results found.      Scheduled Meds: . acetaminophen  650 mg Oral Q6H  . carvedilol  12.5 mg Oral BID WC  . cholecalciferol  1,000 Units Oral Daily  . cyclobenzaprine  5 mg Oral TID  . diclofenac Sodium  2 g Topical QID  . docusate sodium  100 mg Oral Daily  . enoxaparin (LOVENOX) injection  40 mg Subcutaneous Q24H  . exemestane  25 mg Oral QPC breakfast  . feeding supplement (ENSURE ENLIVE)  237 mL Oral TID BM  . ibuprofen  400 mg Oral TID  . loratadine  10 mg Oral Daily  . losartan  12.5 mg Oral Daily  . oxyCODONE  10 mg Oral Q12H  . pantoprazole  40 mg Oral Daily   Continuous Infusions:   LOS: 4 days   Time spent= 40 mins    Mechelle Pates Arsenio Loader, MD [facility]  If 7PM-7AM, please contact night-coverage  09/07/2019, 7:52 AM

## 2019-09-07 NOTE — NC FL2 (Signed)
[place] LEVEL OF CARE SCREENING TOOL     IDENTIFICATION  Patient Name: Erica Young Birthdate: [birth year] Sex: female Admission Date (Current Location): 09/02/2019  [facility] and [place] Number:  Herbalist and Address:  [facility],  [place], [place]      Provider Number: [id number]  Attending Physician Name and Address:  Damita Lack, MD  Relative Name and Phone Number:  son, [name] @ [phone]    Current Level of Care: SNF Recommended Level of Care: [place] Prior Approval Number:    Date Approved/Denied:   PASRR Number: [id number]  Discharge Plan: SNF    Current Diagnoses: Patient Active Problem List   Diagnosis Date Noted  . Malnutrition of moderate degree 09/05/2019  . Vertebral compression fracture ([place]) 09/03/2019  . Hypoxia 09/03/2019  . Compression fracture of L1 lumbar vertebra ([place]) 09/03/2019  . Liver metastasis ([place]) 07/27/2019  . Goals of care, counseling/discussion 05/12/2019  . Syncope   . Acute systolic (congestive) heart failure ([place])   . Shortness of breath   . Acute left-sided weakness 01/29/2019  . Leukopenia due to antineoplastic chemotherapy ([place]) 01/29/2019  . Anxiety 01/29/2019  . Aortic atherosclerosis ([place]) 01/03/2018  . History of epistaxis 10/15/2017  . GI symptoms/possible food intolerance 06/18/2017  . Malignant neoplasm of upper-outer quadrant of right breast in female, estrogen receptor positive ([place]) 01/19/2016  . Bone metastases ([place]) 11/22/2014  . Perennial allergic rhinitis with a nonallergic component 02/12/2014  . Metastatic breast cancer ([place]) 01/23/2014  . History of breast cancer 01/11/2014  . Chronic rhinitis 09/17/2013  . Hilar adenopathy 05/12/2013  . Abdominal pain 05/07/2011  . HTN (hypertension) 06/09/2010  . BACK PAIN 04/10/2010  . Dyslipidemia 03/03/2010  . Cerebrovascular disease 03/03/2010  . DIVERTICULOSIS, COLON  03/03/2010    Orientation RESPIRATION BLADDER Height & Weight     Self, Time, Situation, Place  Normal Continent Weight: 115 lb (52.2 kg) Height:  5\' 1"  (154.9 cm)  BEHAVIORAL SYMPTOMS/MOOD NEUROLOGICAL BOWEL NUTRITION STATUS      Continent Diet(Reg)  AMBULATORY STATUS COMMUNICATION OF NEEDS Skin   Extensive Assist Verbally Normal                       Personal Care Assistance Level of Assistance  Bathing, Dressing Bathing Assistance: Limited assistance   Dressing Assistance: Limited assistance     Functional Limitations Info             SPECIAL CARE FACTORS FREQUENCY  PT (By licensed PT), OT (By licensed OT)     PT Frequency: 5x/wk OT Frequency: 5x/wk            Contractures Contractures Info: Not present    Additional Factors Info  Code Status, Allergies Code Status Info: DNR Allergies Info: see MAR           Current Medications (09/07/2019):  This is the current hospital active medication list Current Facility-Administered Medications  Medication Dose Route Frequency Provider Last Rate Last Admin  . acetaminophen (TYLENOL) tablet 650 mg  650 mg Oral Q6H Arrien, Jimmy Picket, MD   650 mg at 09/07/19 0602  . ALPRAZolam Duanne Moron) tablet 0.25 mg  0.25 mg Oral TID PRN Shela Leff, MD   0.25 mg at 09/06/19 2124  . carvedilol (COREG) tablet 12.5 mg  12.5 mg Oral BID WC Shela Leff, MD   12.5 mg at 09/06/19 1610  . cholecalciferol (VITAMIN D3) tablet 1,000 Units  1,000 Units Oral Daily Shela Leff, MD   1,000 Units at 09/06/19 [phone]  . cyclobenzaprine (FLEXERIL) tablet 5 mg  5 mg Oral TID Tawni Millers, MD   5 mg at 09/06/19 2117  . diclofenac Sodium (VOLTAREN) 1 % topical gel 2 g  2 g Topical QID Tawni Millers, MD   2 g at 09/06/19 2118  . docusate sodium (COLACE) capsule 100 mg  100 mg Oral Daily Shela Leff, MD   100 mg at 09/06/19 0903  . enoxaparin (LOVENOX) injection 40 mg  40 mg Subcutaneous Q24H Shela Leff, MD   40 mg at 09/06/19 0904  . exemestane (AROMASIN) tablet 25 mg  25 mg Oral QPC breakfast Shela Leff, MD   25 mg at 09/06/19 0909  . feeding supplement (ENSURE ENLIVE) (ENSURE ENLIVE) liquid 237 mL  237 mL Oral TID BM Arrien, Jimmy Picket, MD   237 mL at 09/06/19 0910  . ibuprofen (ADVIL) tablet 400 mg  400 mg Oral TID Tawni Millers, MD   400 mg at 09/06/19 2117  . loratadine (CLARITIN) tablet 10 mg  10 mg Oral Daily Shela Leff, MD   10 mg at 09/06/19 2117  . losartan (COZAAR) tablet 12.5 mg  12.5 mg Oral Daily Shela Leff, MD   12.5 mg at 09/06/19 0902  . morphine 2 MG/ML injection 2 mg  2 mg Intravenous Q2H PRN Arrien, Jimmy Picket, MD   2 mg at 09/05/19 0907  . ondansetron (ZOFRAN) injection 4 mg  4 mg Intravenous Q6H PRN Arrien, Jimmy Picket, MD      . oxyCODONE (Oxy IR/ROXICODONE) immediate release tablet 5 mg  5 mg Oral Q4H PRN Arrien, Jimmy Picket, MD   5 mg at 09/06/19 1942  . oxyCODONE (OXYCONTIN) 12 hr tablet 10 mg  10 mg Oral Q12H Arrien, Jimmy Picket, MD   10 mg at 09/06/19 2117  . pantoprazole (PROTONIX) EC tablet 40 mg  40 mg Oral Daily Arrien, Jimmy Picket, MD   40 mg at 09/06/19 4580     Discharge Medications: Please see discharge summary for a list of discharge medications.  Relevant Imaging Results:  Relevant Lab Results:   Additional Information SS#: [id number]  Lennart Pall, LCSW

## 2019-09-08 ENCOUNTER — Inpatient Hospital Stay (HOSPITAL_COMMUNITY): Payer: Medicare Other

## 2019-09-08 LAB — BASIC METABOLIC PANEL
Anion gap: 8 (ref 5–15)
BUN: 22 mg/dL (ref 8–23)
CO2: 28 mmol/L (ref 22–32)
Calcium: 8.7 mg/dL — ABNORMAL LOW (ref 8.9–10.3)
Chloride: 99 mmol/L (ref 98–111)
Creatinine, Ser: 0.57 mg/dL (ref 0.44–1.00)
GFR calc Af Amer: >60 mL/min (ref >60)
GFR calc non Af Amer: >60 mL/min (ref >60)
Glucose, Bld: 114 mg/dL — ABNORMAL HIGH (ref 70–99)
Potassium: 4.3 mmol/L (ref 3.5–5.1)
Sodium: 135 mmol/L (ref 135–145)

## 2019-09-08 LAB — CBC
HCT: 35.1 % — ABNORMAL LOW (ref 36.0–46.0)
Hemoglobin: 11.4 g/dL — ABNORMAL LOW (ref 12.0–15.0)
MCH: 31.5 pg (ref 26.0–34.0)
MCHC: 32.5 g/dL (ref 30.0–36.0)
MCV: 97 fL (ref 80.0–100.0)
Platelets: 149 10*3/uL — ABNORMAL LOW (ref 150–400)
RBC: 3.62 MIL/uL — ABNORMAL LOW (ref 3.87–5.11)
RDW: 14.7 % (ref 11.5–15.5)
WBC: 4.3 10*3/uL (ref 4.0–10.5)
nRBC: 0 % (ref 0.0–0.2)

## 2019-09-08 LAB — MAGNESIUM: Magnesium: 2 mg/dL (ref 1.7–2.4)

## 2019-09-08 MED ORDER — SENNOSIDES-DOCUSATE SODIUM 8.6-50 MG PO TABS: 2.0000 | ORAL_TABLET | Freq: Every evening | ORAL | Status: DC | PRN

## 2019-09-08 MED ORDER — OXYCODONE HCL ER 15 MG PO T12A
15.0000 mg | EXTENDED_RELEASE_TABLET | Freq: Two times a day (BID) | ORAL | Status: DC
  Administered 2019-09-08 – 2019-09-13 (×10): 15 mg via ORAL
  Filled 2019-09-08 (×10): qty 1

## 2019-09-08 MED ORDER — BISACODYL 10 MG RE SUPP: 10.0000 mg | Freq: Every day | RECTAL | Status: DC | PRN

## 2019-09-08 MED ORDER — DOCUSATE SODIUM 100 MG PO CAPS
100.0000 mg | ORAL_CAPSULE | Freq: Two times a day (BID) | ORAL | Status: DC
  Administered 2019-09-08 – 2019-09-12 (×8): 100 mg via ORAL
  Filled 2019-09-08 (×8): qty 1

## 2019-09-08 MED ORDER — SENNOSIDES-DOCUSATE SODIUM 8.6-50 MG PO TABS
2.0000 | ORAL_TABLET | Freq: Two times a day (BID) | ORAL | Status: DC
  Administered 2019-09-08 – 2019-09-12 (×8): 2 via ORAL
  Filled 2019-09-08 (×10): qty 2

## 2019-09-08 MED ORDER — POLYETHYLENE GLYCOL 3350 17 G PO PACK
17.0000 g | PACK | Freq: Every day | ORAL | Status: DC | PRN
  Administered 2019-09-08: 17 g via ORAL
  Filled 2019-09-08: qty 1

## 2019-09-08 NOTE — Progress Notes (Signed)
HEMATOLOGY-ONCOLOGY PROGRESS NOTE  Patient Care Team: Isaac Bliss, Rayford Halsted, MD as PCP - General (Internal Medicine) Debara Pickett Nadean Corwin, MD as PCP - Cardiology (Cardiology) Tanda Rockers, MD as Consulting Physician (Pulmonary Disease) Magrinat, Virgie Dad, MD as Consulting Physician (Oncology) Armbruster, Carlota Raspberry, MD as Consulting Physician (Gastroenterology) Bobbitt, Sed[place] Muta, MD as Consulting Physician (Allergy and Immunology) Barbaraann Faster, RN as [place] [facility] Rozetta Nunnery, MD as Consulting Physician (Otolaryngology) Gery Pray, MD as Consulting Physician (Radiation Oncology) Carson Myrtle Maree Erie, LCSW as [place] MD:   CURRENT TREATMENT: Completed palliative radiation on 09/04/2019, exemestane  SUBJECTIVE: Erica Young came to the hospital secondary to back pain.  X-ray of her lumbar spine showed a pathologic L1 compression fracture with 50% height loss which was new since the prior CT performed on 07/14/2019.  She is also noted to have L5 metastatic disease.  She was given dexamethasone, fentanyl, and Toradol.  Dexamethasone has now been stopped.  She is currently receiving OxyContin 15 mg every 12 hours with oxycodone and IV morphine for breakthrough pain. She recently completed radiation to her lumbar spine on 09/04/2019.  She had an MRI of the lumbar spine performed earlier today which has not yet been read.  Depending on these results, she may be considered for kyphoplasty.  CT is recommending SNF placement for rehabilitation.  REVIEW OF SYSTEMS:   Erica Young reports ongoing back pain.  Reports some weakness in her lower extremities and some mild neuropathy to her toes.  She is able to move her lower extremities without any difficulty.  She also reports constipation.  Nursing is already paged the hospitalist for additional stool softener/laxatives.  BREAST CANCER HISTORY: From the earlier summary note  Erica Young  tells me in 2007 while living in [place] she was found to have a very small cancer in the upper-outer quadrant of the right breast, about the size of the P, noted on mammography. It was not palpable. She had a right lumpectomy and full sentinel lymph node sampling. She then received adjuvant radiation, to a total of 33 treatments. She received no systemic therapy.  She then did well until December 2014, when she had a fall and complain of pain in her left ribs. Rib films and chest x-ray on 04/13/2013 showed no fractures, but CT of the abdomen and pelvis on the same day found subcarinal and right hilar adenopathy. This was followed up with a chest CT scan 05/05/2013 confirming extensive mediastinal and right hilar lymphadenopathy, with multiple pulmonary nodules, the largest measuring 0.9 cm. PET scan 05/21/2013 showed hypermetabolic adenopathy in the right and left paratracheal areas, the precarinal, subcarinal and right hilar areas, but no involvement of the liver, and the lung nodules were not hypermetabolic (although they were below the level of reliable detection). In addition, at L5 there was a lucency measuring 1.2 cm.  The PET scan also showed a hypermetabolic focus on the thyroid gland, which was evaluated with neck ultrasound and biopsy 79/06/4095, showing a follicular lesion of undetermined significance (([id number]).  The patient was referred to pulmonary [Dr Melvyn Novas and Dr Julien Nordmann for further evaluation. Repeat PET scan 12/28/2013 showed, in addition to the adenopathy previously noted, now multiple bony metastases. On 01/07/2014 the patient underwent bronchoscopy and this showed ([id number], together with separate cytology] a low-grade mucinous invasive breast cancer, estrogen receptor 100% positive, progesterone receptor 14% positive, with no HER-2 amplification, the signals ratio being 1.30 and the number per  cell 1.95.  The patient was started on anastrozole 01/22/2014; monthly  denosumab/Xgeva was added 07/30/2014. She appeared to tolerate this well, and her CA-27-29 (127 at baseline) normalized. Most recent CT scans of chest abdomen and pelvis 10/03/2015 showed continuing response. Despite this good news however, the patient decided to go off anastrozole and denosumab/Xgeva as of June 2017. By the time she saw Dr. Earlie Server again in September 2017 her tumor marker had doubled. At that time the patient was referred to the breast clinic for a second opinion regarding further evaluation and treatment.   has a past medical history of Allergy, Anxiety, Bone cancer ([place]) (mri 11/11/14), Cancer ([place]) (2007), Cataract, CEREBROVASCULAR DISEASE (03/03/2010), Diverticulitis, DIVERTICULOSIS, COLON (03/03/2010), Fatty liver (08/02/09), Foramen ovale, GERD (gastroesophageal reflux disease), Headache(784.0), History of cerebral artery stenosis, HYPERLIPIDEMIA (03/03/2010), Hypertension, HYPOTHYROIDISM (03/03/2010), Internal hemorrhoid, Low back pain, Mass of sinus, Mastoiditis, Rib fracture, Right leg pain, Stroke [facility]) (Oct. 2011), and Ulcer.   has a past surgical history that includes Cholecystectomy; Breast lumpectomy; Tonsillectomy; Colonoscopy; Tubal ligation; Shoulder surgery; Video bronchoscopy with endobronchial ultrasound (N/A, 01/07/2014); Cataract extraction (Left); Polypectomy; and Upper gastrointestinal endoscopy.   family history includes Heart disease in her brother, brother, sister, sister, sister, and sister; Stomach cancer in her father.   GYNECOLOGIC HISTORY:  No LMP recorded. Patient is postmenopausal. Menarche age 45, first live birth age 9, the patient is [place] P2. She went through menopause in her late 57s. She took no hormone replacement. She took oral contraceptives for approximately 2 years remotely without complications.  SOCIAL HISTORY: (updated 03/2019) [place]uge is originally from [place], [place]. She worked as a Education officer, museum, particularly, in the field of  substance abuse. Her husband, Erica Young, was a retired substance abuse Social worker. He unfortunately passed away in late October/early November 2020. [place] has 2 children from her first marriage, [name], who lives in [place], and worked as a Audiological scientist but is now disabled, and Lawrence Santiago, who lives in [place] and works in Engineer, mining. Erica Young has 3 children from his first marriage, Tanna Furry, [place], and [place]. [name] Junior lives in [place] and has his own trucking business. Mikki Santee tells me he is estranged from his 2 daughters Amedeo Gory (a retired Pharmacist, hospital) and Salena Saner (who works in a bank). They are living on [place].The patient has 8 grandchildren. Jessee attends [employer].  PHYSICAL EXAMINATION: ECOG PERFORMANCE STATUS: 2 - Symptomatic, <50% confined to bed  Vitals:   09/07/19 2056 09/08/19 0548  BP: 108/61 110/63  Pulse: 80 83  Resp: 15 19  Temp: 97.7 F (36.5 C) 97.9 F (36.6 C)  SpO2: 93% 92%   Filed Weights   09/02/19 2326  Weight: 52.2 kg    Intake/Output from previous day: 05/10 0701 - 05/11 0700 In: 480 [P.O.:480] Out: 500 [Urine:500]  GENERAL: Elderly female, no distress OROPHARYNX:no exudate, no erythema and lips, buccal mucosa, and tongue normal  LUNGS: clear to auscultation and percussion with normal breathing effort HEART: regular rate & rhythm and no murmurs and no lower extremity edema ABDOMEN:abdomen soft, non-tender and normal bowel sounds Musculoskeletal:no cyanosis of digits and no clubbing  NEURO: alert & oriented x 3 with fluent speech, no focal motor/sensory deficits  LABORATORY DATA:  I have reviewed the data as listed CMP Latest Ref Rng & Units 09/08/2019 09/05/2019 09/04/2019  Glucose 70 - 99 mg/dL 114(H) 103(H) 117(H)  BUN 8 - 23 mg/dL _0 Creatinine 0.44 - 1.00 mg/dL 0.57 0.52 0.59  Sodium 135 - 145 mmol/L 135 135 133(L)  Potassium 3.5 - 5.1 mmol/L 4.3 4.1 4.3  Chloride 98 - 111 mmol/L 99 102 104  CO2 22 - 32  mmol/L _0 Calcium 8.9 - 10.3 mg/dL 8.7(L) 8.1(L) 8.4(L)  Total Protein 6.0 - 8.3 g/dL - - -  Total Bilirubin 0.2 - 1.2 mg/dL - - -  Alkaline Phos 39 - 117 U/L - - -  AST 0 - 37 U/L - - -  ALT 0 - 35 U/L - - -    Lab Results  Component Value Date   WBC 4.3 09/08/2019   HGB 11.4 (L) 09/08/2019   HCT 35.1 (L) 09/08/2019   MCV 97.0 09/08/2019   PLT 149 (L) 09/08/2019   NEUTROABS 2.8 09/02/2019    DG Chest 2 View  Result Date: 09/03/2019 CLINICAL DATA:  Low back pain and shortness of breath increasing EXAM: CHEST - 2 VIEW COMPARISON:  CT 02/02/2019, radiograph 01/07/2014 FINDINGS: Right hilar adenopathy is similar to comparison CT. Scattered pulmonary nodules seen on cross-sectional imaging are not well visualized on this exam. There is chronic elevation of the right hemidiaphragm. No consolidation, features of edema, pneumothorax, or effusion. The aorta is calcified. The remaining cardiomediastinal contours are unremarkable. postsurgical changes are noted along the right chest wall. Cholecystectomy clips in the right upper quadrant. No acute osseous or soft tissue abnormality. Degenerative changes are present in the imaged spine and shoulders. IMPRESSION: Right hilar adenopathy, seen on prior cross-sectional imaging. Multiple known pulmonary nodules are poorly visualized on this exam. No acute cardiopulmonary abnormality. Chronic elevation of the right hemidiaphragm. Aortic Atherosclerosis (ICD10-I70.0). Electronically Signed   By: Lovena Le M.D.   On: 09/03/2019 04:03   DG Lumbar Spine Complete  Result Date: 09/03/2019 CLINICAL DATA:  Insert patient with low back pain EXAM: LUMBAR SPINE - COMPLETE 4+ VIEW COMPARISON:  07/14/2019 FINDINGS: Since comparison study the sclerotic L1 body has compressed by 50%. Height loss is right eccentric, where the tumor bulk is greatest by CT. There was extraosseous tumor at this level by CT, with indistinct posterior cortex today. L5 sclerosis which is  also metastatic. Mild scoliosis. IMPRESSION: 1. Pathologic L1 compression fracture with 50% height loss, new since abdominal CT 07/14/2019 2. L5 metastatic disease. Electronically Signed   By: Monte Fantasia M.D.   On: 09/03/2019 04:05    ASSESSMENT: 79 y.o. [place] woman  (1) status post right breast upper outer quadrant lumpectomy and axillary lymph node dissection in 2007 for what appears to have been a T1 N0, stage IA invasive ductal carcinoma, treated adjuvantly with radiation (33 sessions)  METASTATIC DISEASE definitively documented Sept 2015 (2) bronchoscopic biopsy 01/07/2014 showed a low-grade mucinous breast cancer, strongly estrogen receptor positive, progesterone receptor positive, HER-2 negative; staging studies confirmed extensive hypermetabolic adenopathy, multiple bone lesions, likely early lung involvement, but no liver lesions.             (a) bone scan and chest CT 07/09/2016 shows stable disease.             (b) CA-27-29 is moderately informative             (c) bone scan and CT scan of the chest 04/24/2017 shows essentially stable disease  (3) on anastrozole between September 2015 and June 2017, with evidence of response; discontinued secondary to side effects             (a) bone density 04/10/2016 shows osteopenia with a T score  of -2.1  (4) on monthly denosumab/Xgeva between 07/30/2014 and 10/04/2015, discontinued due to patient's concerns regarding osteonecrosis of the jaw             (a) discussed again October 2017, the patient adamantly refusing denosumab  (5) started fulvestrant 01/26/2016, stopped after 01/29/2019 dose at patient's request                  (a) Palbociclib added at 75 mg M/W/F, beginning mid February 2018, also stopped OCT 2020             (b) chest CT scan obtained 12/02/2017 shows small but measurable growth of lung lesions; bones are stable             (c) palbociclib dose increased to 75 mg daily 21/7 beginning 12/23/2017              (d) palbociclib and fulvestrant held after 01/29/2019 fulvestrant dose, which was associated with syncope.  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  Lumbar x-ray on admission showed a pathologic fracture of L1.  MRI of the lumbar spine was performed earlier today and results are currently pending.  IR has been consulted for consideration of kyphoplasty.  From our standpoint, the patient may proceed with kyphoplasty if she is deemed to be a candidate.  Her current pain medications seem to be working well for her.  Recommend for these be continued.  She is reporting constipation.  She is currently on Colace 100 mg twice a day.  Hospitalist has also added MiraLAX and Senokot.  Continue exemestane.   LOS: 5 days   Mikey Bussing, DNP, AGPCNP-BC, AOCNP 09/08/19

## 2019-09-08 NOTE — Progress Notes (Signed)
Chief Complaint: Patient was seen in consultation today for pathologic vertebral fratcures   Referring Physician(s): Dr. Gerlean Ren  Supervising Physician: Sandi Mariscal  Patient Status: [facility] - In-pt  History of Present Illness: Erica Young is a 79 y.o. 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 She presented and was admitted with intractable low back pain. She states pain has been present at least a month and became progressively worse. Initial plain film imaging finds L1 compression fracture and L5 lesion both presumed to be pathologic. She has since had MRI which confirms evidence of acute pathologic L1 fx and L5 pathologic fx, though to a lesser extent. IR is asked to eval for VP/KP procedure. Pt reports feeling better since admission and receiving pain meds, the pain is tolerable. She still requires assistance to move from the bed to the commode. Denies pains, parasthesias down her legs. No loss of bowel or bladder function. PMHx, meds, labs, imaging, allergies reviewed. No signs or findings of acute infectious process.     Past Medical History:  Diagnosis Date  . Allergy   . Anxiety   . Bone cancer ([place]) mri 11/11/14   right parietal bone,left cribriform plate metastases  . Cancer [facility]) 2007   right breat ca  , lumpectomy and radiation tx (declined chemo and additional prophylactic meds)  . Cataract    both eyes  10/2016 Lt.     04/2017 Rt.   . CEREBROVASCULAR DISEASE 03/03/2010  . Diverticulitis   . DIVERTICULOSIS, COLON 03/03/2010  . Fatty liver 08/02/09   as per U/S done by [facility]  . Foramen ovale    positive bubble study  . GERD (gastroesophageal reflux disease)   . Headache(784.0)    she thinks sinus headaches  . History of cerebral artery stenosis    right middle  . HYPERLIPIDEMIA 03/03/2010  . Hypertension   . HYPOTHYROIDISM 03/03/2010   no longer on meds  . Internal hemorrhoid   . Low back pain   . Mass of  sinus   . Mastoiditis    noted on brain MRI  . Rib fracture   . Right leg pain   . Stroke [facility]) Oct. 2011   TIA  . Ulcer     Past Surgical History:  Procedure Laterality Date  . BREAST LUMPECTOMY     right  . CATARACT EXTRACTION Left    10/2016   Rt 04/2017  . CHOLECYSTECTOMY    . COLONOSCOPY    . POLYPECTOMY     benign cecum  . SHOULDER SURGERY     right shoulder (dislocation)  . TONSILLECTOMY    . TUBAL LIGATION    . UPPER GASTROINTESTINAL ENDOSCOPY    . VIDEO BRONCHOSCOPY WITH ENDOBRONCHIAL ULTRASOUND N/A 01/07/2014   Procedure: VIDEO BRONCHOSCOPY WITH ENDOBRONCHIAL ULTRASOUND;  Surgeon: Ivin Poot, MD;  Location: [facility] OR;  Service: Thoracic;  Laterality: N/A;    Allergies: Ciprofloxacin  Medications: Prior to Admission medications   Medication Sig Start Date End Date Taking? Authorizing Provider  ALPRAZolam (XANAX) 0.25 MG tablet Take 1 tablet (0.25 mg total) by mouth 3 (three) times daily as needed. for anxiety 08/18/19  Yes Isaac Bliss, Rayford Halsted, MD  aluminum-magnesium hydroxide 200-200 MG/5ML suspension Take by mouth every 6 (six) hours as needed for indigestion.   Yes [provider]  aspirin EC 81 MG tablet Take 81 mg by mouth daily after breakfast.    Yes [provider]  carvedilol (COREG) 12.5  MG tablet Take 1 tablet (12.5 mg total) by mouth 2 (two) times daily with a meal. 06/05/19  Yes Isaac Bliss, Rayford Halsted, MD  Cholecalciferol (VITAMIN D PO) Take 1,000 Units by mouth daily.    Yes [provider]  docusate sodium (COLACE) 100 MG capsule Take 100 mg by mouth daily.   Yes [provider]  exemestane (AROMASIN) 25 MG tablet Take 1 tablet (25 mg total) by mouth daily after breakfast. 07/27/19  Yes Magrinat, Virgie Dad, MD  Glucosamine 500 MG CAPS Take 500 mg by mouth in the morning and at bedtime.    Yes [provider]  HYDROcodone-acetaminophen (NORCO/VICODIN) 5-325 MG tablet Take 1 tablet by mouth every 6 (six)  hours as needed. Patient taking differently: Take 1 tablet by mouth every 6 (six) hours as needed for moderate pain.  08/31/19  Yes Magrinat, Virgie Dad, MD  loratadine (CLARITIN) 10 MG tablet Take 1 tablet (10 mg total) by mouth daily. 04/16/19  Yes Isaac Bliss, Rayford Halsted, MD  losartan (COZAAR) 25 MG tablet Take 0.5 tablets (12.5 mg total) by mouth daily. 08/19/19  Yes Isaac Bliss, Rayford Halsted, MD  ondansetron (ZOFRAN-ODT) 4 MG disintegrating tablet Take 1 tablet (4 mg total) by mouth every 8 (eight) hours as needed for nausea or vomiting. 08/18/19  Yes Davonna Belling, MD  meclizine (ANTIVERT) 25 MG tablet Take 1 tablet (25 mg total) by mouth 3 (three) times daily as needed for dizziness. Patient not taking: Reported on 08/18/2019 07/14/19   Kennyth Arnold, FNP     Family History  Problem Relation Age of Onset  . Heart disease Sister        cerebral vascular disease also  . Heart disease Brother        cerebral vascular disease also  . Heart disease Brother        cerebral vascular disease  . Heart disease Sister        cebreal vascular disease also  . Heart disease Sister        cebreal vascular diease also  . Heart disease Sister        cerebral vascular diease also  . Stomach cancer Father   . Colon cancer Neg Hx   . Esophageal cancer Neg Hx   . Rectal cancer Neg Hx   . Colon polyps Neg Hx   . Asthma Neg Hx     Social History   Socioeconomic History  . Marital status: Widowed    Spouse name: [name] Deceased in 2023-03-26  . Number of children: 2  . Years of education: 34  . Highest education level: Master's degree (e.g., MA, MS, MEng, MEd, MSW, MBA)  Occupational History  . Occupation: Social Worker--Retired  Tobacco Use  . Smoking status: Former Smoker    Packs/day: 0.50    Years: 20.00    Pack years: 10.00    Types: Cigarettes    Start date: 61    Quit date: 05/01/1991    Years since quitting: 28.3  . Smokeless tobacco: Never Used  Substance and Sexual Activity    . Alcohol use: No    Alcohol/week: 0.0 standard drinks  . Drug use: No  . Sexual activity: Yes  Other Topics Concern  . Not on file  Social History Narrative   Daily caffeine    Social Determinants of Health   Financial Resource Strain: Low Risk   . Difficulty of Paying Living Expenses: Not hard at all  Food Insecurity: No Food  Insecurity  . Worried About Charity fundraiser in the Last Year: Never true  . Ran Out of Food in the Last Year: Never true  Transportation Needs: No Transportation Needs  . Lack of Transportation (Medical): No  . Lack of Transportation (Non-Medical): No  Physical Activity: Inactive  . Days of Exercise per Week: 0 days  . Minutes of Exercise per Session: 0 min  Stress: Stress Concern Present  . Feeling of Stress : Rather much  Social Connections: Moderately Isolated  . Frequency of Communication with Friends and Family: Once a week  . Frequency of Social Gatherings with Friends and Family: Once a week  . Attends Religious Services: More than 4 times per year  . Active Member of Clubs or Organizations: No  . Attends Archivist Meetings: Never  . Marital Status: Widowed    Review of Systems: A 12 point ROS discussed and pertinent positives are indicated in the HPI above.  All other systems are negative.  Review of Systems  Vital Signs: BP 114/67 (BP Location: Left Arm)   Pulse 70   Temp 98.5 F (36.9 C)   Resp 17   Ht 5\' 1"  (1.549 m)   Wt 52.2 kg   SpO2 92%   BMI 21.73 kg/m   Physical Exam Constitutional:      General: She is not in acute distress.    Appearance: Normal appearance.  HENT:     Mouth/Throat:     Mouth: Mucous membranes are moist.     Pharynx: Oropharynx is clear.  Cardiovascular:     Rate and Rhythm: Normal rate and regular rhythm.     Heart sounds: Normal heart sounds.  Pulmonary:     Effort: Pulmonary effort is normal. No respiratory distress.     Breath sounds: Normal breath sounds.  Abdominal:      General: Abdomen is flat. There is no distension.     Palpations: Abdomen is soft.     Tenderness: There is no abdominal tenderness.  Musculoskeletal:     Comments: Tender around the L1 region as well as the L5 level, but less so. No palpable defects  Skin:    General: Skin is warm and dry.  Neurological:     General: No focal deficit present.     Mental Status: She is alert and oriented to person, place, and time.     Sensory: No sensory deficit.     Motor: No weakness.  Psychiatric:        Mood and Affect: Mood normal.        Thought Content: Thought content normal.       Imaging: DG Chest 2 View  Result Date: 09/03/2019 CLINICAL DATA:  Low back pain and shortness of breath increasing EXAM: CHEST - 2 VIEW COMPARISON:  CT 02/02/2019, radiograph 01/07/2014 FINDINGS: Right hilar adenopathy is similar to comparison CT. Scattered pulmonary nodules seen on cross-sectional imaging are not well visualized on this exam. There is chronic elevation of the right hemidiaphragm. No consolidation, features of edema, pneumothorax, or effusion. The aorta is calcified. The remaining cardiomediastinal contours are unremarkable. postsurgical changes are noted along the right chest wall. Cholecystectomy clips in the right upper quadrant. No acute osseous or soft tissue abnormality. Degenerative changes are present in the imaged spine and shoulders. IMPRESSION: Right hilar adenopathy, seen on prior cross-sectional imaging. Multiple known pulmonary nodules are poorly visualized on this exam. No acute cardiopulmonary abnormality. Chronic elevation of the right hemidiaphragm. Aortic Atherosclerosis (ICD10-I70.0).  Electronically Signed   By: Lovena Le M.D.   On: 09/03/2019 04:03   DG Lumbar Spine Complete  Result Date: 09/03/2019 CLINICAL DATA:  Insert patient with low back pain EXAM: LUMBAR SPINE - COMPLETE 4+ VIEW COMPARISON:  07/14/2019 FINDINGS: Since comparison study the sclerotic L1 body has compressed by  50%. Height loss is right eccentric, where the tumor bulk is greatest by CT. There was extraosseous tumor at this level by CT, with indistinct posterior cortex today. L5 sclerosis which is also metastatic. Mild scoliosis. IMPRESSION: 1. Pathologic L1 compression fracture with 50% height loss, new since abdominal CT 07/14/2019 2. L5 metastatic disease. Electronically Signed   By: Monte Fantasia M.D.   On: 09/03/2019 04:05   MR LUMBAR SPINE WO CONTRAST  Result Date: 09/08/2019 CLINICAL DATA:  Low back pain. EXAM: MRI LUMBAR SPINE WITHOUT CONTRAST TECHNIQUE: Multiplanar, multisequence MR imaging of the lumbar spine was performed. No intravenous contrast was administered. COMPARISON:  CT of the abdomen July 14, 2019. Lumbar spine radiographs Sep 03, 2019. FINDINGS: Segmentation:  Standard. Alignment:  Physiologic. Vertebrae: Diffuse T1 hypointense signal of the L1 and L5 vertebral body extending into the corresponding pedicles consistent with secondary involvement. Associated pathologic fractures with approximately 60% loss of vertebral body height at L1 and 35% at L5. Retropulsion of the posterior wall into the spinal canal with effacement of the right lateral recess and mild to moderate narrowing of the spinal canal at L1 and effacement of the bilateral lateral recesses at L5. Conus medullaris and cauda equina: Conus extends to the T12-L1 level. Conus and cauda equina appear normal. Paraspinal and other soft tissues: Multiple right renal cysts. Colonic diverticula. Disc levels: T12-L1: Narrowing of the right subarticular zone at the disc level related to L1 expansile lesion. No neural foraminal stenosis. At the level of the L1 vertebral body, there is effacement of the right lateral recess and mild to moderate spinal canal stenosis related to retropulsion of the L1 posterior wall. L1-2: No spinal canal stenosis. Moderate to severe narrowing of the right neural foramen related to expansile L1 lesion. L2-3: Right  asymmetric disc bulge and mild facet degenerative changes without significant spinal canal or neural foraminal stenosis. L3-4: Disc bulge and mild facet degenerative changes. No significant spinal canal or neural foraminal stenosis. L4-5: No neural foraminal stenosis. No spinal canal stenosis at the level of the disc space. Effacement of the bilateral lateral recesses and mild spinal canal stenosis at the level of the L5 vertebral body. L5-S1: No spinal canal or neural foraminal stenosis. IMPRESSION: 1. Pathologic fractures at L1 and L5 with approximately 60% and 35% loss of vertebral body height, respectively. 2. Retropulsion of the posterior wall into the spinal canal resulting in mild to moderate spinal canal stenosis at L1 and mild spinal canal stenosis at L5. 3. Moderate to severe right L1-L2 neural foraminal stenosis related to extensor lesion encroachment. Electronically Signed   By: Pedro Earls M.D.   On: 09/08/2019 09:35    Labs:  CBC: Recent Labs    08/11/19 1414 08/11/19 1414 08/18/19 1029 09/02/19 2351 09/03/19 0000 09/08/19 0249  WBC 6.0  --  5.1 4.1  --  4.3  HGB 12.8   < > 13.4 11.7* 11.9* 11.4*  HCT 37.9   < > 39.8 35.6* 35.0* 35.1*  PLT 285.0  --  245 160  --  149*   < > = values in this interval not displayed.    COAGS: Recent Labs  01/29/19 1048 09/07/19 0910  INR 1.1 1.0  APTT 28  --     BMP: Recent Labs    08/18/19 1029 08/18/19 1029 09/03/19 0000 09/04/19 0320 09/05/19 0338 09/08/19 0249  NA 129*   < > 135 133* 135 135  K 4.3   < > 3.4* 4.3 4.1 4.3  CL 94*   < > 97* 104 102 99  CO2 25  --   --  24 26 28   GLUCOSE 128*   < > 108* 117* 103* 114*  BUN 11   < > 8 12 10 22   CALCIUM 9.1  --   --  8.4* 8.1* 8.7*  CREATININE 0.69   < > 0.40* 0.59 0.52 0.57  GFRNONAA >60  --   --  >60 >60 >60  GFRAA >60  --   --  >60 >60 >60   < > = values in this interval not displayed.    LIVER FUNCTION TESTS: Recent Labs    01/23/19 1516  01/29/19 0855 01/29/19 1048 06/24/19 0808  BILITOT 0.4 0.3 0.6 0.5  AST 22 16 28 17   ALT 19 13 16 13   ALKPHOS 82 82 73 114  PROT 7.2 7.2 7.1 7.0  ALBUMIN 4.0 3.7 4.0 3.8    TUMOR MARKERS: No results for input(s): AFPTM, CEA, CA199, CHROMGRNA in the last 8760 hours.  Assessment and Plan: Acute pathologic fractures at L1 and L5. Case and imaging discussed with Dr. Pascal Lux After review of the imaging and given the underlying metastatic nature of these fractures, feel pt would be a good candidate for Osteocool ablation therapy prior to KP. Treatment options explained in detail to pt and believe she has a good understanding of what co-ablation therapy is intended to provide, with the ultimate goal being pain relief. She is agreeable to move forward. We will seek insurance approval for this and can tentatively plan for the procedure on Fri 5/14.   Thank you for this interesting consult.  I greatly enjoyed meeting [employer] and look forward to participating in their care.  A copy of this report was sent to the requesting provider on this date.  Electronically Signed: Ascencion Dike, PA-C 09/08/2019, 4:11 PM   I spent a total of 20 minutes in face to face in clinical consultation, greater than 50% of which was counseling/coordinating care for L1 and L5 ablation/KP

## 2019-09-08 NOTE — Progress Notes (Signed)
PROGRESS NOTE    Erica Young  YTK:[id number] DOB: 1940/11/28 DOA: 09/02/2019 PCP: Isaac Bliss, Rayford Halsted, MD   Brief Narrative:  79 year old with history of GERD, HTN, stage IV breast cancer with liver metastases, history of CVA, hypothyroidism, CHF presented with intractable low back pain diagnosed with acute vertebral L1 compression fracture in the setting of osseous metastases.  MRI ordered, IR consulted for possible kyphoplasty.   Assessment & Plan:   Principal Problem:   Vertebral compression fracture (HCC) Active Problems:   HTN (hypertension)   Metastatic breast cancer (HCC)   Bone metastases (HCC)   Hypoxia   Compression fracture of L1 lumbar vertebra (HCC)   Malnutrition of moderate degree  Acute L1 compression fracture with intractable low back pain Bony metastases from stage IV metastatic breast cancer Increase oxycodone ER 15 mg twice daily, scheduled Tylenol and ibuprofen.  Flexeril 3 times daily As needed IV morphine.  Topical diclofenac PT recommends SNF, patient is reluctant. IR consulted to evaluate for kyphoplasty. MRI ordered for am.  TLSO brace  Intravascular dehydration with hypokalemia Constipation/dyspepsia Aggressive bowel regimen, IV fluids as needed.  Encourage oral intake Dulcolax suppository as needed  Bilateral atelectasis with transient hypoxemia Encourage use of incentive spirometer  Stage IV metastatic breast cancer/liver and bone Status post radiation, continue exemestane.  Follows with Dr. Jana Hakim  Moderate protein calorie malnutrition Encourage p.o. intake  Essential hypertension Coreg and losartan  Anxiety Xanax as needed  PT-recommending SNF  DVT prophylaxis: Lovenox Code Status: DNR Family Communication: None  Status is: Inpatient  Remains inpatient appropriate because:Ongoing active pain requiring inpatient pain management.  Ongoing pain control and medication adjustments.   Dispo: The patient is from: Home               Anticipated d/c is to: SNF              Anticipated d/c date is: 1 day              Patient currently is not medically stable to d/c.  MRI done this morning, results pending.  IR consulted for need for kyphoplasty.  Ongoing pain control.   Subjective: Complaining of some constipation this morning and back pain.  States back pain is slightly better controlled on oral medications now.  Still has exacerbations with low movement.  Review of Systems Otherwise negative except as per HPI, including: General: Denies fever, chills, night sweats or unintended weight loss. Resp: Denies cough, wheezing, shortness of breath. Cardiac: Denies chest pain, palpitations, orthopnea, paroxysmal nocturnal dyspnea. GI: Denies abdominal pain, nausea, vomiting, diarrhea or constipation GU: Denies dysuria, frequency, hesitancy or incontinence MS: Denies muscle aches, joint pain or swelling Neuro: Denies headache, neurologic deficits (focal weakness, numbness, tingling), abnormal gait Psych: Denies anxiety, depression, SI/HI/AVH Skin: Denies new rashes or lesions ID: Denies sick contacts, exotic exposures, travel  Examination: Constitutional: Elderly frail. Respiratory: Clear to auscultation bilaterally Cardiovascular: Normal sinus rhythm, no rubs Abdomen: Nontender nondistended good bowel sounds Musculoskeletal: No edema noted Skin: No rashes seen Neurologic: CN 2-12 grossly intact.  And nonfocal Psychiatric: Normal judgment and insight. Alert and oriented x 3. Normal mood. Objective: Vitals:   09/07/19 0554 09/07/19 1412 09/07/19 2056 09/08/19 0548  BP: 131/75 120/68 108/61 110/63  Pulse: 88 79 80 83  Resp: 16 18 15 19   Temp: 97.6 F (36.4 C) (!) 97.3 F (36.3 C) 97.7 F (36.5 C) 97.9 F (36.6 C)  TempSrc: Oral     SpO2: 95% 95% 93% 92%  Weight:      Height:        Intake/Output Summary (Last 24 hours) at 09/08/2019 0755 Last data filed at 09/08/2019 0622 Gross per 24 hour  Intake 480  ml  Output 500 ml  Net -20 ml   Filed Weights   09/02/19 2326  Weight: 52.2 kg     Data Reviewed:   CBC: Recent Labs  Lab 09/02/19 2351 09/03/19 0000 09/08/19 0249  WBC 4.1  --  4.3  NEUTROABS 2.8  --   --   HGB 11.7* 11.9* 11.4*  HCT 35.6* 35.0* 35.1*  MCV 95.4  --  97.0  PLT 160  --  778*   Basic Metabolic Panel: Recent Labs  Lab 09/02/19 2351 09/03/19 0000 09/04/19 0320 09/05/19 0338 09/08/19 0249  NA  --  135 133* 135 135  K  --  3.4* 4.3 4.1 4.3  CL  --  97* 104 102 99  CO2  --   --  24 26 28   GLUCOSE  --  108* 117* 103* 114*  BUN  --  8 12 10 22   CREATININE  --  0.40* 0.59 0.52 0.57  CALCIUM  --   --  8.4* 8.1* 8.7*  MG 1.9  --   --   --  2.0   GFR: Estimated Creatinine Clearance: 43 mL/min (by C-G formula based on SCr of 0.57 mg/dL). Liver Function Tests: No results for input(s): AST, ALT, ALKPHOS, BILITOT, PROT, ALBUMIN in the last 168 hours. No results for input(s): LIPASE, AMYLASE in the last 168 hours. No results for input(s): AMMONIA in the last 168 hours. Coagulation Profile: Recent Labs  Lab 09/07/19 0910  INR 1.0   Cardiac Enzymes: No results for input(s): CKTOTAL, CKMB, CKMBINDEX, TROPONINI in the last 168 hours. BNP (last 3 results) No results for input(s): PROBNP in the last 8760 hours. HbA1C: No results for input(s): HGBA1C in the last 72 hours. CBG: No results for input(s): GLUCAP in the last 168 hours. Lipid Profile: No results for input(s): CHOL, HDL, LDLCALC, TRIG, CHOLHDL, LDLDIRECT in the last 72 hours. Thyroid Function Tests: No results for input(s): TSH, T4TOTAL, FREET4, T3FREE, THYROIDAB in the last 72 hours. Anemia Panel: No results for input(s): VITAMINB12, FOLATE, FERRITIN, TIBC, IRON, RETICCTPCT in the last 72 hours. Sepsis Labs: No results for input(s): PROCALCITON, LATICACIDVEN in the last 168 hours.  Recent Results (from the past 240 hour(s))  Respiratory Panel by RT PCR (Flu A&B, Covid) - Nasopharyngeal Swab      Status: None   Collection Time: 09/02/19 11:51 PM   Specimen: Nasopharyngeal Swab  Result Value Ref Range Status   SARS Coronavirus 2 by RT PCR NEGATIVE NEGATIVE Final    Comment: (NOTE) SARS-CoV-2 target nucleic acids are NOT DETECTED. The SARS-CoV-2 RNA is generally detectable in upper respiratoy specimens during the acute phase of infection. The lowest concentration of SARS-CoV-2 viral copies this assay can detect is 131 copies/mL. A negative result does not preclude SARS-Cov-2 infection and should not be used as the sole basis for treatment or other patient management decisions. A negative result may occur with  improper specimen collection/handling, submission of specimen other than nasopharyngeal swab, presence of viral mutation(s) within the areas targeted by this assay, and inadequate number of viral copies (<131 copies/mL). A negative result must be combined with clinical observations, patient history, and epidemiological information. The expected result is Negative. Fact Sheet for Patients:  PinkCheek.be Fact Sheet for Healthcare Providers:  GravelBags.it This test  is not yet ap proved or cleared by the [place] and  has been authorized for detection and/or diagnosis of SARS-CoV-2 by FDA under an Emergency Use Authorization (EUA). This EUA will remain  in effect (meaning this test can be used) for the duration of the COVID-19 declaration under Section 564(b)(1) of the Act, 21 U.S.C. section 360bbb-3(b)(1), unless the authorization is terminated or revoked sooner.    Influenza A by PCR NEGATIVE NEGATIVE Final   Influenza B by PCR NEGATIVE NEGATIVE Final    Comment: (NOTE) The Xpert Xpress SARS-CoV-2/FLU/RSV assay is intended as an aid in  the diagnosis of influenza from Nasopharyngeal swab specimens and  should not be used as a sole basis for treatment. Nasal washings and  aspirates are unacceptable for  Xpert Xpress SARS-CoV-2/FLU/RSV  testing. Fact Sheet for Patients: PinkCheek.be Fact Sheet for Healthcare Providers: GravelBags.it This test is not yet approved or cleared by the [place] FDA and  has been authorized for detection and/or diagnosis of SARS-CoV-2 by  FDA under an Emergency Use Authorization (EUA). This EUA will remain  in effect (meaning this test can be used) for the duration of the  Covid-19 declaration under Section 564(b)(1) of the Act, 21  U.S.C. section 360bbb-3(b)(1), unless the authorization is  terminated or revoked. Performed at [facility], [place] [address], [place], [place] [address]          Radiology Studies: No results found.      Scheduled Meds: . acetaminophen  650 mg Oral Q6H  . carvedilol  12.5 mg Oral BID WC  . cholecalciferol  1,000 Units Oral Daily  . cyclobenzaprine  5 mg Oral TID  . diclofenac Sodium  2 g Topical QID  . docusate sodium  100 mg Oral BID  . enoxaparin (LOVENOX) injection  40 mg Subcutaneous Q24H  . exemestane  25 mg Oral QPC breakfast  . feeding supplement (ENSURE ENLIVE)  237 mL Oral TID BM  . ibuprofen  400 mg Oral TID  . loratadine  10 mg Oral Daily  . losartan  12.5 mg Oral Daily  . oxyCODONE  15 mg Oral Q12H  . pantoprazole  40 mg Oral Daily   Continuous Infusions:   LOS: 5 days   Time spent= 40 mins    Matteus Mcnelly Arsenio Loader, MD [facility]  If 7PM-7AM, please contact night-coverage  09/08/2019, 7:55 AM

## 2019-09-08 NOTE — Progress Notes (Signed)
Was called into room by NT, who was trying to assist patient to the bedside commode. Patient was refusing to use the walker and wanted NT to lift her. She stated that the walker hurt her and we were not physical therapists and she was not trained to use a walker. This nurse educated her on the use of the walker for stability and safety and explained that we use them on a daily basis to assist our patients. The tech and I proceeded to get the patient OOB reluctantly, she was extremely anxious, yelling out, grabbing onto everything but the walker. Once we had her holding onto the walker, she was trying to push it away and turn without using it. Her knees were buckling, she was hunched over due to discomfort in her lower back, and she kept saying that she was small and we needed to just lift her. Patient was contributing about 25% strength in standing and pivoting, NT and I got her safely on the [facility] and again explained the importance of using the walker. She was extremely adamant about not using it, kept repeating that she wants the doctor called to tell us she didn't need the walker. Patient is extremely unsteady, very anxious, and impulsive at this time which makes her a high fall risk. We assisted her back to bed, explained again that we are there to help keep her safe and we would do things for her the safest way possible so as not to hurt her or [place]. Patient not agreeable at this time, will re-evaluate the next time the need arises for toileting. For now, bed locked and low, bed alarm on, door left open per patient request, will continue to monitor.

## 2019-09-08 NOTE — Progress Notes (Signed)
Occupational Therapy Treatment Patient Details Name: Erica Young MRN: [id number] DOB: 09-Sep-1940 Today's Date: 09/08/2019    History of present illness Pt admitted 2* intractable back pain 2* acute vertebral L1 compression fx in the setting of osseous metastasis.  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 Patient requires increased time and close supervision for safety due to shooting pains with patient holding her L side. Will continue to follow.   Follow Up Recommendations  SNF    Equipment Recommendations  Other (comment)(defer to next venue)       Precautions / Restrictions Precautions Precautions: Back;Other (comment) Precaution Comments: back for comfort Required Braces or Orthoses: Spinal Brace Spinal Brace: Thoracolumbosacral orthotic;Applied in sitting position Restrictions Weight Bearing Restrictions: No       Mobility Bed Mobility Overal bed mobility: Needs Assistance Bed Mobility: Rolling;Sidelying to Sit;Sit to Sidelying Rolling: Min guard Sidelying to sit: Min assist     Sit to sidelying: Min assist General bed mobility comments: multi modal cues for log rolling technique, min A for LE management  Transfers Overall transfer level: Needs assistance               General transfer comment: deferred    Balance Overall balance assessment: Needs assistance Sitting-balance support: Single extremity supported;Feet supported Sitting balance-Leahy Scale: Fair Sitting balance - Comments: close supervision due to shooting pain                                   ADL either performed or assessed with clinical judgement   ADL Overall ADL's : Needs assistance/impaired     Grooming: Oral care;Wash/dry face;Wash/dry hands;Sitting;Supervision/safety Grooming Details (indicate cue  type and reason): patient requires increased time to complete activity due to shooting pain down L back and buttock, supervision for safety with sitting balance                                               Cognition Arousal/Alertness: Awake/alert Behavior During Therapy: WFL for tasks assessed/performed Overall Cognitive Status: Within Functional Limits for tasks assessed                                 General Comments: patient does perseverate on pain                   Pertinent Vitals/ Pain       Pain Assessment: Faces Faces Pain Scale: Hurts whole lot Pain Location: L back/buttock pain with sitting Pain Descriptors / Indicators: Grimacing;Guarding;Moaning Pain Intervention(s): Monitored during session;Limited activity within patient's tolerance         Frequency  Min 2X/week        Progress Toward Goals  OT Goals(current goals can now be found in the care plan section)  Progress towards OT goals: Progressing toward goals  Acute Rehab OT Goals Patient Stated Goal: Less pain OT Goal Formulation: With patient Time For Goal Achievement: 09/18/19 Potential to Achieve Goals: Good ADL Goals Pt Will Perform Upper Body Dressing: with set-up;sitting Pt Will Perform Lower Body Dressing:  with min assist;sitting/lateral leans;with adaptive equipment Pt Will Transfer to Toilet: with min guard assist;with supervision;bedside commode;ambulating Pt Will Perform Toileting - Clothing Manipulation and hygiene: with min guard assist;with supervision;sit to/from stand;sitting/lateral leans Additional ADL Goal #1: Patient will demonstrate log roll technique for bed mobility with less than 25% cues in order to minimize pain and increase independence with mobility in preparation for self care tasks.  Plan Discharge plan remains appropriate       AM-PAC OT "6 Clicks" Daily Activity     Outcome Measure   Help from another person eating meals?:  None Help from another person taking care of personal grooming?: A Little Help from another person toileting, which includes using toliet, bedpan, or urinal?: A Lot Help from another person bathing (including washing, rinsing, drying)?: A Lot Help from another person to put on and taking off regular upper body clothing?: A Little Help from another person to put on and taking off regular lower body clothing?: Total 6 Click Score: 15    End of Session  OT Visit Diagnosis: Unsteadiness on feet (R26.81);Muscle weakness (generalized) (M62.81);Pain Pain - Right/Left: Left Pain - part of body: Hip(back)   Activity Tolerance Patient limited by pain   Patient Left in bed;with call bell/phone within reach;with nursing/sitter in room   Nurse Communication Mobility status        Time: 2330-0762 OT Time Calculation (min): 23 min  Charges: OT General Charges $OT Visit: 1 Visit OT Treatments $Self Care/Home Management : 23-37 mins  Delbert Phenix OT Pager: [place] 09/08/2019, 12:20 PM

## 2019-09-08 NOTE — Progress Notes (Signed)
Manufacturing engineer [facility])  Hospital Liaison Note  This RN met with TOC Lucy and pt at bedside to discuss POC for d/c.  Pt verbalizes desire to dc to SNF for rehab.  This RN explained that pt can remain palliative care pt with [facility] until time to dc from rehab, at which time pt can become hospice pt.  Pt again expressed desire to go to [facility] for EOL.  This RN reiterated criteria for BP admission and that pt is not eligible at this time.    Thank you for the opportunity to participate in this pt's care.  Domenic Moras, BSN, [place] [phone] (24h on call)

## 2019-09-09 ENCOUNTER — Other Ambulatory Visit: Payer: Self-pay | Admitting: *Deleted

## 2019-09-09 DIAGNOSIS — S32010S Wedge compression fracture of first lumbar vertebra, sequela: Secondary | ICD-10-CM

## 2019-09-09 MED ORDER — CEFAZOLIN SODIUM-DEXTROSE 2-4 GM/100ML-% IV SOLN: 2.0000 g | INTRAVENOUS | Status: AC

## 2019-09-09 NOTE — Patient Outreach (Signed)
[facility]) Care Management  09/09/2019  Jenilee Franey 08/22/1940 [id number]   Unsuccessful [facility] outreach to complex care patient (referred to [facility] 02/27/19 after [facility] (snf)discharge by [facility] MSN-Ed, RN, BSN [facility]) coordinator)  Plus  Recent MD referral (Dr Isaac Bliss per on 08/19/19 for active [facility] patient  for consult cancer with metsto her spineC50.411,Z17.0 (ICD-10-CM) - Malignant neoplasm of upper-outer quadrant of right breast in female, estrogen receptor positive  Unsuccessful THN RN CM outreachto Erica Young  No answer. THN RN CM left HIPAA [facility] Portability and Accountability Act) compliant voicemail message along with CM's contact info.   Plan: [facility] RN CM sent this [facility] engaged patient an unsuccessful outreach letter and scheduled this patient for another call attempt within 7-14 business days  Lenus Trauger L. Lavina Hamman, RN, BSN, [place] Coordinator Office number [phone] Mobile number [phone]  Main [facility]N number [phone] Fax number [phone]

## 2019-09-09 NOTE — Progress Notes (Signed)
Patient refused morning labs.

## 2019-09-09 NOTE — Plan of Care (Signed)

## 2019-09-09 NOTE — Progress Notes (Signed)
PROGRESS NOTE  Erica Young LFY:[id number] DOB: [birth year] DOA: 09/02/2019 PCP: Isaac Bliss, Rayford Halsted, MD  Brief History   79 year old with history of GERD, HTN, stage IV breast cancer with liver metastases, history of CVA, hypothyroidism, CHF presented with intractable low back pain diagnosed with acute vertebral L1 compression fracture in the setting of osseous metastases.  MRI ordered, IR consulted for possible kyphoplasty. Radiology has been consulted. Their recommendation is for the patient to go for Osteocool ablation prior to kyphoplasty. Radiology is seeking insurance approval for this procedure. It is anticipated that the patient would be scheduled for the procedure on Friday.  The patient has been evaluated by PT/OT. They have recommended SNF.  Consultants  . Interventional radiology . Medical oncology  Procedures  . None  Antibiotics   Anti-infectives (From admission, onward)   Start     Dose/Rate Route Frequency Ordered Stop   09/11/19 1400  ceFAZolin (ANCEF) IVPB 2g/100 mL premix     2 g 200 mL/hr over 30 Minutes Intravenous To Radiology 09/09/19 1340 09/12/19 1400    .  Subjective  The patient is resting comfortably. She is still having pain. No new complaints.  Objective   Vitals:  Vitals:   09/09/19 0815 09/09/19 1332  BP: 116/68 (!) 109/51  Pulse: 85 83  Resp: (!) 22 17  Temp:  97.8 F (36.6 C)  SpO2: 92% 98%    Exam:  Constitutional:  The patient is awake, alert, and oriented x 3. No acute distress. Respiratory:  No increased work of breathing. No wheezes, rales, or rhonchi No tactile fremitus Cardiovascular:  Regular rate and rhythm No murmurs, ectopy, or gallups. No lateral PMI. No thrills. Abdomen:  Abdomen is soft, non-tender, non-distended No hernias, masses, or organomegaly Normoactive bowel sounds.  Musculoskeletal:  No cyanosis, clubbing, or edema Skin:  No rashes, lesions, ulcers palpation of skin: no induration or  nodules Neurologic:  CN 2-12 intact Sensation all 4 extremities intact Psychiatric:  Mental status Mood, affect appropriate Orientation to person, place, time  judgment and insight appear intact  I have personally reviewed the following:   Today's Data  . Vitals  Imaging  . MRI L spine: pathological fx at L1 and L5; retropulsion of the posterior wall into the spinal canal resulting in mild to moderate spinal canal stenosis at L2 and mild spinal canal stnosis at L5. There is also moderate to severe right L1-L2 neural foraminal stenosis related to extensor lesion encroachment.  Scheduled Meds: . acetaminophen  650 mg Oral Q6H  . carvedilol  12.5 mg Oral BID WC  . cholecalciferol  1,000 Units Oral Daily  . cyclobenzaprine  5 mg Oral TID  . diclofenac Sodium  2 g Topical QID  . docusate sodium  100 mg Oral BID  . enoxaparin (LOVENOX) injection  40 mg Subcutaneous Q24H  . exemestane  25 mg Oral QPC breakfast  . feeding supplement (ENSURE ENLIVE)  237 mL Oral TID BM  . ibuprofen  400 mg Oral TID  . loratadine  10 mg Oral Daily  . losartan  12.5 mg Oral Daily  . oxyCODONE  15 mg Oral Q12H  . pantoprazole  40 mg Oral Daily  . senna-docusate  2 tablet Oral BID   Continuous Infusions: . [START ON 09/11/2019]  ceFAZolin (ANCEF) IV      Principal Problem:   Vertebral compression fracture (HCC) Active Problems:   HTN (hypertension)   Metastatic breast cancer ([place])   Bone metastases ([place])   Hypoxia  Compression fracture of L1 lumbar vertebra (HCC)   Malnutrition of moderate degree   LOS: 6 days   A & P  Acute L1 compression fracture with intractable low back pain due to bony metastases from stage IV metastatic breast cancer: Pain control with Oxy ER 15 mg bid with scheduled tylenol and ibuprofen and flexeril tid. IV morphine is available in an as needed fashion. IR is have the patient authorized for Osteocool ablation followed by kyphoplasty. It is thought that this will take  place on Friday. She has been fitted for a TLSO brace.  Intravascular dehydration with hypokalemia with constipation and dyspepsia Aggressive bowel regimen, IV fluids as needed.  Encourage oral intake Dulcolax suppository as needed. She did have one stool this morning.  Bilateral atelectasis with transient hypoxemia: Encourage use of incentive spirometer  Stage IV metastatic breast cancer/liver and bone: Status post radiation, continue exemestane.  Follows with Dr. Jana Hakim  Moderate protein calorie malnutrition: Encourage p.o. intake  Essential hypertension: Coreg and losartan  Anxiety: Xanax as needed  I have seen and examined this patient myself. I have spent 34 minutes in her evaluation and care.  DVT prophylaxis: Lovenox Code Status: DNR Family Communication: None Disposition: The patient is from home. Disposition is anticipated to be to SNF as recommended by PT/OT. Barriers to discharge include patient's ongoing pain and the plan for osteocool procedure to be followed by kyphoplasty by IR on Friday. The patient is not medically cleared for discharged.  Omere Marti, DO [facility] Direct contact: see [email or web address]  7PM-7AM contact night coverage as above 09/09/2019, 4:28 PM  LOS: 6 days

## 2019-09-09 NOTE — Plan of Care (Signed)

## 2019-09-10 LAB — URINE CULTURE

## 2019-09-10 MED ORDER — ADULT MULTIVITAMIN W/MINERALS CH
1.0000 | ORAL_TABLET | Freq: Every day | ORAL | Status: DC
  Administered 2019-09-10: 1 via ORAL
  Filled 2019-09-10 (×3): qty 1

## 2019-09-10 MED ORDER — DRONABINOL 2.5 MG PO CAPS
2.5000 mg | ORAL_CAPSULE | Freq: Every day | ORAL | Status: DC
  Administered 2019-09-10: 2.5 mg via ORAL
  Filled 2019-09-10: qty 1

## 2019-09-10 MED ORDER — CARVEDILOL 6.25 MG PO TABS
6.2500 mg | ORAL_TABLET | Freq: Two times a day (BID) | ORAL | Status: DC
  Administered 2019-09-10 – 2019-09-12 (×3): 6.25 mg via ORAL
  Filled 2019-09-10 (×3): qty 1

## 2019-09-10 NOTE — Progress Notes (Addendum)
Nutrition Follow-up  RD working remotely.   DOCUMENTATION CODES:   Non-severe (moderate) malnutrition in context of chronic illness  INTERVENTION:  - continue Ensure Enlive TID and Magic Cup BID.  - will order snack at 1400 daily. - consider trial of appetite stimulant if PO intakes remain <25%.  * weigh patient today.    NUTRITION DIAGNOSIS:   Moderate Malnutrition related to chronic illness(stage IV breast cancer with mets to liver and bone) as evidenced by energy intake < or equal to 75% for > or equal to 1 month, moderate muscle depletion, moderate fat depletion, severe fat depletion. -ongoing  GOAL:   Patient will meet greater than or equal to 90% of their needs -unmet  MONITOR:   Weight trends, Labs, Supplement acceptance, PO intake  ASSESSMENT:   79 year old female with past medical history of stage IV breast cancer with mets to liver and bone, CVA, GERD, HTN, HLD, systolic CHF, hypothyroidism presenting with complaints of back pain admitted with vertebral compression fracture.  Per flow sheet documentation, patient has been consuming 10-25% of meals since last assessment on 5/7. Appetite remains decreased and she has been experiencing ongoing pain, which is severe at times, which has further decreased appetite/desire to eat. She has been accepting Ensure ~50% of the time offered. She has not been weighed since admission on 5/5.  Per notes: - osteocool ablation to occur possibly on 5/14 - L1 compression fx d/t bony mets for patient with stage 4 metastatic breast cancer - constipation--bowel regimen ordered  - moderate protein calorie malnutrition    Labs reviewed; Ca: 8.7 mg/dl. Medications reviewed; 1000 units cholecalciferol/day, 100 mg colace BID, 40 mg protonix/day, 2 tablets senokot BID.    Diet Order:   Diet Order            Diet NPO time specified Except for: Sips with Meds  Diet effective midnight        Diet regular Room service appropriate? Yes; Fluid  consistency: Thin  Diet effective now              EDUCATION NEEDS:   Education needs have been addressed  Skin:  Skin Assessment: Reviewed RN Assessment  Last BM:  5/12  Height:   Ht Readings from Last 1 Encounters:  09/02/19 5\' 1"  (1.549 m)    Weight:   Wt Readings from Last 1 Encounters:  09/02/19 52.2 kg    Ideal Body Weight:     BMI:  Body mass index is 21.73 kg/m.  Estimated Nutritional Needs:   Kcal:  1638-4665  Protein:  80-90  Fluid:  >/= 1.5 L/day     Jarome Matin, MS, RD, LDN, CNSC Inpatient Clinical Dietitian RD pager # available in AMION  After hours/weekend pager # available in [facility]

## 2019-09-10 NOTE — Progress Notes (Signed)
Physical Therapy Treatment Patient Details Name: Erica Young MRN: [id number] DOB: 1940/06/19 Today's Date: 09/10/2019    History of Present Illness Pt admitted 2* intractable back pain 2* acute vertebral L1 compression fx in the setting of osseous metastasis.  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 She was remains hesitant to participate in OOB activity but is agreeable with encouragement. Patient requires Total Assist to don back brace and pt educated on step for donning this session. She required very light assist to complete sit<>stand and min assist to sequence stand step transfer to recliner. Pt c/o pain sitting and hot packs applied and RN notified. Acute PT will continue to progress pt as able.    Follow Up Recommendations  SNF     Equipment Recommendations  None recommended by PT    Recommendations for Other Services       Precautions / Restrictions Precautions Precautions: Back;Other (comment) Precaution Comments: back for comfort Required Braces or Orthoses: Spinal Brace Spinal Brace: Thoracolumbosacral orthotic;Applied in sitting position;Other (comment)(on for gait) Restrictions Weight Bearing Restrictions: No    Mobility  Bed Mobility Overal bed mobility: Needs Assistance Bed Mobility: Rolling;Sidelying to Sit Rolling: Min guard Sidelying to sit: Min assist       General bed mobility comments: VC for sequencing log roll technique; pt abel to complete roll with min guard. Pt requried assist to sequence bringing LE's off EOB and to raise trunk up.   Transfers Overall transfer level: Needs assistance Equipment used: Rolling walker (2 wheeled) Transfers: Sit to/from [facility] Sit to Stand: Min assist Stand pivot transfers: Min assist       General transfer comment: pt hesitant to complete sit<>stand  transfer at EOB due to pain, however pt able to initaite and complete rise with light min assist. Pt required seated rest to drink water and was unable to maintain standing with 1 UE support. Min assist needed for stand step transfer to recliner, and pt taking slow short steps and some buckling noted at bil knees with SLS (Lt>Rt). TLSO removed after pt seated in recliner.   Ambulation/Gait Ambulation/Gait assistance: Min assist Gait Distance (Feet): 5 Feet Assistive device: Rolling walker (2 wheeled) Gait Pattern/deviations: Step-to pattern;Decreased step length - right;Decreased step length - left;Decreased stride length Gait velocity: decreased   General Gait Details: pt limited to side steps at EOB after initial stand and then able to take small steps to recliner with significant encouragement.    Stairs      Wheelchair Mobility    Modified Rankin (Stroke Patients Only)       Balance Overall balance assessment: Needs assistance Sitting-balance support: Single extremity supported;Feet supported Sitting balance-Leahy Scale: Fair Sitting balance - Comments: close supervision due to shooting pain   Standing balance support: Bilateral upper extremity supported Standing balance-Leahy Scale: Poor Standing balance comment: unable to maintain standing without bil UE support             Cognition Arousal/Alertness: Awake/alert Behavior During Therapy: WFL for tasks assessed/performed Overall Cognitive Status: Within Functional Limits for tasks assessed            General Comments: patient perseverates on pain      Exercises      General Comments        Pertinent Vitals/Pain Pain Assessment: Faces Faces Pain Scale: Hurts whole  lot Pain Location: L back/buttock pain with sitting Pain Descriptors / Indicators: Grimacing;Guarding;Moaning Pain Intervention(s): Monitored during session;Limited activity within patient's tolerance;Repositioned;Heat applied;Patient  requesting pain meds-RN notified           PT Goals (current goals can now be found in the care plan section) Acute Rehab PT Goals Patient Stated Goal: Less pain PT Goal Formulation: With patient Time For Goal Achievement: 09/18/19 Potential to Achieve Goals: Fair Progress towards PT goals: Progressing toward goals(slow, limited by pain)    Frequency    Min 3X/week      PT Plan Current plan remains appropriate       AM-PAC PT "6 Clicks" Mobility   Outcome Measure  Help needed turning from your back to your side while in a flat bed without using bedrails?: A Little Help needed moving from lying on your back to sitting on the side of a flat bed without using bedrails?: A Little Help needed moving to and from a bed to a chair (including a wheelchair)?: A Little Help needed standing up from a chair using your arms (e.g., wheelchair or bedside chair)?: A Little Help needed to walk in hospital room?: A Little Help needed climbing 3-5 steps with a railing? : A Lot 6 Click Score: 17    End of Session Equipment Utilized During Treatment: Gait belt;Back brace Activity Tolerance: Patient limited by pain Patient left: in chair;with call bell/phone within reach(with physician in room) Nurse Communication: Mobility status;Patient requests pain meds PT Visit Diagnosis: Muscle weakness (generalized) (M62.81);Difficulty in walking, not elsewhere classified (R26.2);Pain Pain - part of body: (back)     Time: 7408-1448 PT Time Calculation (min) (ACUTE ONLY): 31 min  Charges:  $Therapeutic Activity: 23-37 mins                     Verner Mould, DPT Physical Therapist with [facility] [phone]  09/10/2019 12:02 PM

## 2019-09-10 NOTE — Plan of Care (Signed)
Plan of care reviewed and discussed with the patient. 

## 2019-09-10 NOTE — Progress Notes (Addendum)
IR requested by Dr. Reesa Chew for management of L1/L5 pathologic compression fractures.  Case/images have been reviewed by Dr. Pascal Lux who recommends osteocool ablation/vertebral augmentation procedure for management. Insurance authorization approved. Plan for image-guided L1 and L5 osteocool ablation with possible biopsy with vertebral augmentation (kyphoplasty/vertebroplasty) tentatively for tomorrow 09/11/2019 in IR. Patient has been seen for procedure, consent signed and in chart (see IR progress note from 5/11 for further information). Patient will be NPO at midnight. INR 1.0 09/07/2019.  Please call IR with questions/concerns.   Bea Graff Kaycen Whitworth, PA-C 09/10/2019, 2:12 PM

## 2019-09-10 NOTE — Progress Notes (Signed)
PROGRESS NOTE    Erica Young  EPP:[id number] DOB: July 31, 1940 DOA: 09/02/2019 PCP: Isaac Bliss, Rayford Halsted, MD  No chief complaint on file.   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 Radiology has been consulted. Their recommendation is for the patient to go for Osteocool ablation prior to kyphoplasty. Radiology is seeking insurance approval for this procedure. It is anticipated that the patient would be scheduled for the procedure on Friday.  The patient has been evaluated by PT/OT. They have recommended SNF.  Subjective: Only consume 10-25% of the meal, Scheduled Tylenol And ibuprofen for pain control, she required 2 as needed doses of Xanax for anxiety and 1 as needed dose oxycodone for pain in the last 24 hours    Assessment & Plan:   Principal Problem:   Vertebral compression fracture (HCC) Active Problems:   HTN (hypertension)   Metastatic breast cancer ([place])   Bone metastases ([place])   Hypoxia   Compression fracture of L1 lumbar vertebra (HCC)   Malnutrition of moderate degree  Acute pathologic fracture at L1 and L5 She has been fitted for a TLSO brace Pain control N.p.o. after midnight, plan for Osteocool ablation therapy prior to KP on May 14 IR input appreciated,   Intravascular dehydration with hypokalemia with constipation and dyspepsia Aggressive bowel regimen, IV fluids as needed. Encourage oral intake Dulcolax suppository as needed.  She is having BM, potassium is normal today  Bilateral atelectasis with transient hypoxemia: Encourage use of incentive spirometer She is on room air at rest  Essential hypertension: Coreg and losartan   Stage IV metastatic breast  cancer/liver and bone: Status post radiation, continue exemestane. Follows with Dr. Jana Hakim  Non-severe (moderate) malnutrition in context of chronic illness With poor appetite, Only consume 10-25% of the meal, Nutrition input appreciated, start Marinol for appetite stimulant  DVT prophylaxis: Lovenox on hold  Due to anticipating IR intervention Code Status: DNR Family Communication: patient Disposition:   Status is: Inpatient  Dispo: The patient is from: home              Anticipated d/c is to:snf              Anticipated d/c date is: in 24-48hrs pending IR intervention              Patient currently is not medically stable to d/c. needs IR intervention then snf placement    Consultants:   IR   oncology  Procedures:  Plan for Osteocool ablation therapy  Antimicrobials:   Ancefx1 for IR procedure on May 14     Objective: Vitals:   09/09/19 1332 09/09/19 1837 09/09/19 2158 09/10/19 0603  BP: (!) 109/51 113/65 109/63 107/62  Pulse: 83 87 82 81  Resp: 17  17 17   Temp: 97.8 F (36.6 C)  97.8 F (36.6 C) 98 F (36.7 C)  TempSrc: Oral  Oral   SpO2: 98% 95% 93% 94%  Weight:      Height:        Intake/Output Summary (Last 24 hours) at 09/10/2019 0848 Last data filed at 09/10/2019 0200 Gross per 24 hour  Intake 360 ml  Output 400 ml  Net -40 ml   Filed Weights   09/02/19 2326  Weight: 52.2 kg    Examination:  General exam: Frail and weak, not in acute distress Respiratory system: Diminished at bases , no wheezing, no rhonchi no rales  Cardiovascular system: S1 & S2 heard, RRR. No JVD, no murmurs. No pedal edema. Gastrointestinal system: Abdomen is nondistended, soft and nontender. No organomegaly or masses felt. Normal bowel sounds heard. Central nervous system: Alert and oriented. No focal neurological deficits. Extremities: Symmetric , sensation in tact Skin: No rashes, lesions or ulcers Psychiatry: Judgement and insight appear normal. Mood & affect  appropriate.     Data Reviewed: I have personally reviewed following labs and imaging studies  CBC: Recent Labs  Lab 09/08/19 0249  WBC 4.3  HGB 11.4*  HCT 35.1*  MCV 97.0  PLT 149*    Basic Metabolic Panel: Recent Labs  Lab 09/04/19 0320 09/05/19 0338 09/08/19 0249  NA 133* 135 135  K 4.3 4.1 4.3  CL 104 102 99  CO2 24 26 28   GLUCOSE 117* 103* 114*  BUN 12 10 22   CREATININE 0.59 0.52 0.57  CALCIUM 8.4* 8.1* 8.7*  MG  --   --  2.0    GFR: Estimated Creatinine Clearance: 43 mL/min (by C-G formula based on SCr of 0.57 mg/dL).  Liver Function Tests: No results for input(s): AST, ALT, ALKPHOS, BILITOT, PROT, ALBUMIN in the last 168 hours.  CBG: No results for input(s): GLUCAP in the last 168 hours.   Recent Results (from the past 240 hour(s))  Respiratory Panel by RT PCR (Flu A&B, Covid) - Nasopharyngeal Swab     Status: None   Collection Time: 09/02/19 11:51 PM   Specimen: Nasopharyngeal Swab  Result Value Ref Range Status   SARS Coronavirus 2 by RT PCR NEGATIVE NEGATIVE Final    Comment: (NOTE) SARS-CoV-2 target nucleic acids are NOT DETECTED. The SARS-CoV-2 RNA is generally detectable in upper respiratoy specimens during the acute phase of infection. The lowest concentration of SARS-CoV-2 viral copies this assay can detect is 131 copies/mL. A negative result does not preclude SARS-Cov-2 infection and should not be used as the sole basis for treatment or other patient management decisions. A negative result may occur with  improper specimen collection/handling, submission of specimen other than nasopharyngeal swab, presence of viral mutation(s) within the areas targeted by this assay, and inadequate number of viral copies (<131 copies/mL). A negative result must be combined with clinical observations, patient history, and epidemiological information. The expected result is Negative. Fact Sheet for Patients:   PinkCheek.be Fact Sheet for Healthcare Providers:  GravelBags.it This test is not yet ap proved or cleared by the [place] FDA and  has been authorized for detection and/or diagnosis of SARS-CoV-2 by FDA under an Emergency Use Authorization (EUA). This EUA will remain  in effect (meaning this test can be used) for the duration of the COVID-19 declaration under Section 564(b)(1) of the Act, 21 U.S.C. section 360bbb-3(b)(1), unless the authorization is terminated or revoked sooner.    Influenza A by PCR NEGATIVE NEGATIVE Final   Influenza B by PCR NEGATIVE NEGATIVE Final    Comment: (NOTE) The Xpert Xpress SARS-CoV-2/FLU/RSV assay is intended as an aid in  the diagnosis of influenza from Nasopharyngeal swab specimens and  should not be used as a sole basis for treatment. Nasal washings and  aspirates are unacceptable for Xpert Xpress SARS-CoV-2/FLU/RSV  testing. Fact Sheet for Patients: PinkCheek.be Fact Sheet for Healthcare Providers: GravelBags.it This test is not yet approved or cleared by the [place] FDA and  has been authorized for detection  and/or diagnosis of SARS-CoV-2 by  FDA under an Emergency Use Authorization (EUA). This EUA will remain  in effect (meaning this test can be used) for the duration of the  Covid-19 declaration under Section 564(b)(1) of the Act, 21  U.S.C. section 360bbb-3(b)(1), unless the authorization is  terminated or revoked. Performed at [facility], [place] [address], [place], [place] [address]          Radiology Studies: MR LUMBAR SPINE WO CONTRAST  Result Date: 09/08/2019 CLINICAL DATA:  Low back pain. EXAM: MRI LUMBAR SPINE WITHOUT CONTRAST TECHNIQUE: Multiplanar, multisequence MR imaging of the lumbar spine was performed. No intravenous contrast was administered. COMPARISON:  CT of the abdomen July 14, 2019. Lumbar spine radiographs Sep 03, 2019. FINDINGS: Segmentation:  Standard. Alignment:  Physiologic. Vertebrae: Diffuse T1 hypointense signal of the L1 and L5 vertebral body extending into the corresponding pedicles consistent with secondary involvement. Associated pathologic fractures with approximately 60% loss of vertebral body height at L1 and 35% at L5. Retropulsion of the posterior wall into the spinal canal with effacement of the right lateral recess and mild to moderate narrowing of the spinal canal at L1 and effacement of the bilateral lateral recesses at L5. Conus medullaris and cauda equina: Conus extends to the T12-L1 level. Conus and cauda equina appear normal. Paraspinal and other soft tissues: Multiple right renal cysts. Colonic diverticula. Disc levels: T12-L1: Narrowing of the right subarticular zone at the disc level related to L1 expansile lesion. No neural foraminal stenosis. At the level of the L1 vertebral body, there is effacement of the right lateral recess and mild to moderate spinal canal stenosis related to retropulsion of the L1 posterior wall. L1-2: No spinal canal stenosis. Moderate to severe narrowing of the right neural foramen related to expansile L1 lesion. L2-3: Right asymmetric disc bulge and mild facet degenerative changes without significant spinal canal or neural foraminal stenosis. L3-4: Disc bulge and mild facet degenerative changes. No significant spinal canal or neural foraminal stenosis. L4-5: No neural foraminal stenosis. No spinal canal stenosis at the level of the disc space. Effacement of the bilateral lateral recesses and mild spinal canal stenosis at the level of the L5 vertebral body. L5-S1: No spinal canal or neural foraminal stenosis. IMPRESSION: 1. Pathologic fractures at L1 and L5 with approximately 60% and 35% loss of vertebral body height, respectively. 2. Retropulsion of the posterior wall into the spinal canal resulting in mild to moderate spinal  canal stenosis at L1 and mild spinal canal stenosis at L5. 3. Moderate to severe right L1-L2 neural foraminal stenosis related to extensor lesion encroachment. Electronically Signed   By: Pedro Earls M.D.   On: 09/08/2019 09:35        Scheduled Meds: . acetaminophen  650 mg Oral Q6H  . carvedilol  6.25 mg Oral BID WC  . cholecalciferol  1,000 Units Oral Daily  . cyclobenzaprine  5 mg Oral TID  . diclofenac Sodium  2 g Topical QID  . docusate sodium  100 mg Oral BID  . dronabinol  2.5 mg Oral QAC supper  . enoxaparin (LOVENOX) injection  40 mg Subcutaneous Q24H  . exemestane  25 mg Oral QPC breakfast  . feeding supplement (ENSURE ENLIVE)  237 mL Oral TID BM  . ibuprofen  400 mg Oral TID  . loratadine  10 mg Oral Daily  . losartan  12.5 mg Oral Daily  . multivitamin with minerals  1 tablet Oral Daily  .  oxyCODONE  15 mg Oral Q12H  . pantoprazole  40 mg Oral Daily  . senna-docusate  2 tablet Oral BID   Continuous Infusions: . [START ON 09/11/2019]  ceFAZolin (ANCEF) IV       LOS: 7 days     Time spent: 21mins I have personally reviewed and interpreted on  09/10/2019 daily labs,  imagings as discussed above under date review session and assessment and plans.  I reviewed all nursing notes, pharmacy notes, consultant notes,  vitals, pertinent old records  I have discussed plan of care as described above with  patient  on 09/10/2019  Voice Recognition /Dragon dictation system was used to create this note, attempts have been made to correct errors. Please contact the author with questions and/or clarifications.   Florencia Reasons, MD PhD FACP [facility]  Available via Epic secure chat 7am-7pm for nonurgent issues Please page for urgent issues To page the attending provider between 7A-7P or the covering provider during after hours 7P-7A, please log into the web site [email or web address] and access using universal [place] password for that web site. If you do not  have the password, please call the hospital operator.    09/10/2019, 8:48 AM

## 2019-09-10 NOTE — TOC Progression Note (Signed)
Transition of Care [facility]) - Progression Note    Patient Details  Name: Erica Young MRN: [id number] Date of Birth: [birth year]  Transition of Care [facility] CM/SW Contact  Lennart Pall, LCSW Phone Number: 09/10/2019, 2:07 PM  Clinical Narrative:   Have spoken with pt and son - both in agreement to move forward with IR procedure tomorrow.  Will continue with d/c plan for SNF once medically ready to transition.    Expected Discharge Plan: [place] Barriers to Discharge: Continued Medical Work up  Expected Discharge Plan and Services Expected Discharge Plan: [place] arrangements for the past 2 months: Single Family Home                                       Social Determinants of Health (SDOH) Interventions    Readmission Risk Interventions No flowsheet data found.

## 2019-09-11 ENCOUNTER — Inpatient Hospital Stay (HOSPITAL_COMMUNITY): Payer: Medicare Other

## 2019-09-11 ENCOUNTER — Other Ambulatory Visit: Payer: Self-pay

## 2019-09-11 HISTORY — PX: IR BONE TUMOR(S)RF ABLATION: IMG2284

## 2019-09-11 HISTORY — PX: IR KYPHO LUMBAR INC FX REDUCE BONE BX UNI/BIL CANNULATION INC/IMAGING: IMG5519

## 2019-09-11 HISTORY — PX: IR KYPHO EA ADDL LEVEL THORACIC OR LUMBAR: IMG5520

## 2019-09-11 LAB — BASIC METABOLIC PANEL
Anion gap: 8 (ref 5–15)
BUN: 18 mg/dL (ref 8–23)
CO2: 28 mmol/L (ref 22–32)
Calcium: 8.7 mg/dL — ABNORMAL LOW (ref 8.9–10.3)
Chloride: 96 mmol/L — ABNORMAL LOW (ref 98–111)
Creatinine, Ser: 0.61 mg/dL (ref 0.44–1.00)
GFR calc Af Amer: >60 mL/min (ref >60)
GFR calc non Af Amer: >60 mL/min (ref >60)
Glucose, Bld: 102 mg/dL — ABNORMAL HIGH (ref 70–99)
Potassium: 4.1 mmol/L (ref 3.5–5.1)
Sodium: 132 mmol/L — ABNORMAL LOW (ref 135–145)

## 2019-09-11 LAB — CBC
HCT: 32.7 % — ABNORMAL LOW (ref 36.0–46.0)
Hemoglobin: 10.5 g/dL — ABNORMAL LOW (ref 12.0–15.0)
MCH: 31.8 pg (ref 26.0–34.0)
MCHC: 32.1 g/dL (ref 30.0–36.0)
MCV: 99.1 fL (ref 80.0–100.0)
Platelets: 154 10*3/uL (ref 150–400)
RBC: 3.3 MIL/uL — ABNORMAL LOW (ref 3.87–5.11)
RDW: 14.7 % (ref 11.5–15.5)
WBC: 3.2 10*3/uL — ABNORMAL LOW (ref 4.0–10.5)
nRBC: 0 % (ref 0.0–0.2)

## 2019-09-11 LAB — MAGNESIUM: Magnesium: 2 mg/dL (ref 1.7–2.4)

## 2019-09-11 MED ORDER — FENTANYL CITRATE (PF) 100 MCG/2ML IJ SOLN
INTRAMUSCULAR | Status: AC | PRN
  Administered 2019-09-11: 25 ug via INTRAVENOUS
  Administered 2019-09-11 (×5): 50 ug via INTRAVENOUS
  Administered 2019-09-11: 25 ug via INTRAVENOUS

## 2019-09-11 MED ORDER — FENTANYL CITRATE (PF) 100 MCG/2ML IJ SOLN
INTRAMUSCULAR | Status: AC
  Filled 2019-09-11: qty 2

## 2019-09-11 MED ORDER — LIDOCAINE HCL (PF) 1 % IJ SOLN
INTRAMUSCULAR | Status: AC | PRN
  Administered 2019-09-11 (×2): 10 mL

## 2019-09-11 MED ORDER — IOHEXOL 300 MG/ML  SOLN
50.0000 mL | Freq: Once | INTRAMUSCULAR | Status: AC | PRN
  Administered 2019-09-11: 50 mL

## 2019-09-11 MED ORDER — LIDOCAINE HCL (PF) 1 % IJ SOLN
INTRAMUSCULAR | Status: AC
  Filled 2019-09-11: qty 30

## 2019-09-11 MED ORDER — KETOROLAC TROMETHAMINE 30 MG/ML IJ SOLN
INTRAMUSCULAR | Status: AC
  Filled 2019-09-11: qty 1

## 2019-09-11 MED ORDER — CHLORHEXIDINE GLUCONATE CLOTH 2 % EX PADS: 6.0000 | MEDICATED_PAD | Freq: Every day | CUTANEOUS | Status: DC

## 2019-09-11 MED ORDER — KETOROLAC TROMETHAMINE 30 MG/ML IJ SOLN
INTRAMUSCULAR | Status: AC | PRN
  Administered 2019-09-11: 30 mg via INTRAVENOUS

## 2019-09-11 MED ORDER — FENTANYL CITRATE (PF) 100 MCG/2ML IJ SOLN
INTRAMUSCULAR | Status: AC
  Filled 2019-09-11: qty 4

## 2019-09-11 MED ORDER — MIDAZOLAM HCL 2 MG/2ML IJ SOLN
INTRAMUSCULAR | Status: AC | PRN
  Administered 2019-09-11 (×4): 1 mg via INTRAVENOUS
  Administered 2019-09-11 (×2): 0.5 mg via INTRAVENOUS
  Administered 2019-09-11: 1 mg via INTRAVENOUS

## 2019-09-11 MED ORDER — ENOXAPARIN SODIUM 40 MG/0.4ML ~~LOC~~ SOLN
40.0000 mg | SUBCUTANEOUS | Status: DC
  Administered 2019-09-13: 40 mg via SUBCUTANEOUS
  Filled 2019-09-11: qty 0.4

## 2019-09-11 MED ORDER — LIDOCAINE HCL 1 % IJ SOLN
INTRAMUSCULAR | Status: AC
  Filled 2019-09-11: qty 20

## 2019-09-11 MED ORDER — MIDAZOLAM HCL 2 MG/2ML IJ SOLN
INTRAMUSCULAR | Status: AC
  Filled 2019-09-11: qty 2

## 2019-09-11 MED ORDER — CEFAZOLIN SODIUM-DEXTROSE 2-4 GM/100ML-% IV SOLN
INTRAVENOUS | Status: AC
  Administered 2019-09-11: 2 g via INTRAVENOUS
  Filled 2019-09-11: qty 100

## 2019-09-11 MED ORDER — LIDOCAINE-EPINEPHRINE 1 %-1:100000 IJ SOLN
INTRAMUSCULAR | Status: AC
  Filled 2019-09-11: qty 1

## 2019-09-11 MED ORDER — MIDAZOLAM HCL 2 MG/2ML IJ SOLN
INTRAMUSCULAR | Status: AC
  Filled 2019-09-11: qty 4

## 2019-09-11 NOTE — Progress Notes (Signed)
PROGRESS NOTE    Erica Young  BOF:[id number] DOB: [birth year] DOA: 09/02/2019 PCP: Isaac Bliss, Rayford Halsted, MD  No chief complaint on file.   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 Radiology has been consulted. Their recommendation is for the patient to go for Osteocool ablation prior to kyphoplasty. Radiology is seeking insurance approval for this procedure. It is anticipated that the patient would be scheduled for the procedure on Friday.  The patient has been evaluated by PT/OT. They have recommended SNF.  Subjective:  She is getting IR procedure today She is seen after returned from the procedure, she still requires prn pain meds  Assessment & Plan:   Principal Problem:   Vertebral compression fracture (HCC) Active Problems:   HTN (hypertension)   Metastatic breast cancer ([place])   Bone metastases ([place])   Hypoxia   Compression fracture of L1 lumbar vertebra (HCC)   Malnutrition of moderate degree  Acute pathologic fracture at L1 and L5 She has been fitted for a TLSO brace Pain control S/p RF ablation (Ostecool) and cement augmentation of the L1 and L5 vertebral bodies on 5/14 IRput appreciated,  Resume Lovenox prophylaxis on May 16 if patient remains in hospital  Intravascular dehydration with hypokalemia with constipation and dyspepsia Aggressive bowel regimen, IV fluids as needed. Encourage oral intake Dulcolax suppository as needed.  She is having BM, potassium is normal today  Bilateral atelectasis with transient hypoxemia: Encourage use of incentive spirometer She is on room air at rest  Essential hypertension: Coreg and losartan   Stage IV metastatic breast cancer/liver and  bone: Status post radiation, continue exemestane. Follows with Dr. Jana Hakim  Non-severe (moderate) malnutrition in context of chronic illness With poor appetite, Only consume 10-25% of the meal, Nutrition input appreciated, start Marinol for appetite stimulant  DVT prophylaxis: Lovenox on hold  Due to needing  IR intervention Code Status: DNR Family Communication: patient Disposition:   Status is: Inpatient  Dispo: The patient is from: home              Anticipated d/c is to:snf              Anticipated d/c date is: in 24-48hrs pending IR intervention              Patient currently is not medically stable to d/c. needs IR intervention then snf placement    Consultants:   IR   oncology  Procedures:  RF ablation (Ostecool) and cement augmentation of the L1 and L5 vertebral bodies.on 5/14 picc line placement by IR on 5/14  Antimicrobials:   Ancefx1 for IR procedure on May 14     Objective: Vitals:   09/11/19 1640 09/11/19 1645 09/11/19 1650 09/11/19 1655  BP: (!) 174/95 (!) 177/102 (!) 172/95 (!) 155/94  Pulse: 95 91 96 91  Resp: (!) 23 16 (!) 24 (!) 25  Temp:      TempSrc:      SpO2: 99% 98% (!) 79% 97%  Weight:      Height:        Intake/Output Summary (Last 24 hours) at 09/11/2019 1658 Last data filed at 09/11/2019 1535 Gross per 24 hour  Intake 150 ml  Output --  Net 150 ml   [employer]   09/02/19  2326 09/10/19 2246  Weight: 52.2 kg 51.1 kg    Examination:  General exam: Frail and weak, not in acute distress Respiratory system: Diminished at bases , no wheezing, no rhonchi no rales  Cardiovascular system: S1 & S2 heard, RRR. No JVD, no murmurs. No pedal edema. Gastrointestinal system: Abdomen is nondistended, soft and nontender. No organomegaly or masses felt. Normal bowel sounds heard. Central nervous system: Alert and oriented. No focal neurological deficits. Extremities: Symmetric , sensation in tact Skin: No rashes, lesions or  ulcers Psychiatry: Judgement and insight appear normal. Mood & affect appropriate.     Data Reviewed: I have personally reviewed following labs and imaging studies  CBC: Recent Labs  Lab 09/08/19 0249 09/11/19 0319  WBC 4.3 3.2*  HGB 11.4* 10.5*  HCT 35.1* 32.7*  MCV 97.0 99.1  PLT 149* 914    Basic Metabolic Panel: Recent Labs  Lab 09/05/19 0338 09/08/19 0249 09/11/19 0319  NA 135 135 132*  K 4.1 4.3 4.1  CL 102 99 96*  CO2 26 28 28   GLUCOSE 103* 114* 102*  BUN 10 22 18   CREATININE 0.52 0.57 0.61  CALCIUM 8.1* 8.7* 8.7*  MG  --  2.0 2.0    GFR: Estimated Creatinine Clearance: 43 mL/min (by C-G formula based on SCr of 0.61 mg/dL).  Liver Function Tests: No results for input(s): AST, ALT, ALKPHOS, BILITOT, PROT, ALBUMIN in the last 168 hours.  CBG: No results for input(s): GLUCAP in the last 168 hours.   Recent Results (from the past 240 hour(s))  Respiratory Panel by RT PCR (Flu A&B, Covid) - Nasopharyngeal Swab     Status: None   Collection Time: 09/02/19 11:51 PM   Specimen: Nasopharyngeal Swab  Result Value Ref Range Status   SARS Coronavirus 2 by RT PCR NEGATIVE NEGATIVE Final    Comment: (NOTE) SARS-CoV-2 target nucleic acids are NOT DETECTED. The SARS-CoV-2 RNA is generally detectable in upper respiratoy specimens during the acute phase of infection. The lowest concentration of SARS-CoV-2 viral copies this assay can detect is 131 copies/mL. A negative result does not preclude SARS-Cov-2 infection and should not be used as the sole basis for treatment or other patient management decisions. A negative result may occur with  improper specimen collection/handling, submission of specimen other than nasopharyngeal swab, presence of viral mutation(s) within the areas targeted by this assay, and inadequate number of viral copies (<131 copies/mL). A negative result must be combined with clinical observations, patient history, and epidemiological  information. The expected result is Negative. Fact Sheet for Patients:  PinkCheek.be Fact Sheet for Healthcare Providers:  GravelBags.it This test is not yet ap proved or cleared by the [place] FDA and  has been authorized for detection and/or diagnosis of SARS-CoV-2 by FDA under an Emergency Use Authorization (EUA). This EUA will remain  in effect (meaning this test can be used) for the duration of the COVID-19 declaration under Section 564(b)(1) of the Act, 21 U.S.C. section 360bbb-3(b)(1), unless the authorization is terminated or revoked sooner.    Influenza A by PCR NEGATIVE NEGATIVE Final   Influenza B by PCR NEGATIVE NEGATIVE Final    Comment: (NOTE) The Xpert Xpress SARS-CoV-2/FLU/RSV assay is intended as an aid in  the diagnosis of influenza from Nasopharyngeal swab specimens and  should not be used as a sole basis for treatment. Nasal washings and  aspirates are unacceptable for Xpert Xpress SARS-CoV-2/FLU/RSV  testing. Fact Sheet for Patients: PinkCheek.be Fact Sheet for Healthcare Providers: GravelBags.it This  test is not yet approved or cleared by the [place] and  has been authorized for detection and/or diagnosis of SARS-CoV-2 by  FDA under an Emergency Use Authorization (EUA). This EUA will remain  in effect (meaning this test can be used) for the duration of the  Covid-19 declaration under Section 564(b)(1) of the Act, 21  U.S.C. section 360bbb-3(b)(1), unless the authorization is  terminated or revoked. Performed at [facility], [place] [address], [place], [place] [address]   Urine Culture     Status: Abnormal   Collection Time: 09/09/19 11:36 AM   Specimen: Urine, Clean Catch  Result Value Ref Range Status   Specimen Description   Final    URINE, CLEAN CATCH Performed at [facility], [place]  [address] [place], [place] [address]    Special Requests   Final    NONE Performed at [facility], [place] [address] [place], [place] [address]    Culture MULTIPLE SPECIES PRESENT, SUGGEST RECOLLECTION (A)  Final   Report Status 09/10/2019 FINAL  Final         Radiology Studies: No results found.      Scheduled Meds: . acetaminophen  650 mg Oral Q6H  . carvedilol  6.25 mg Oral BID WC  . [START ON 09/12/2019] Chlorhexidine Gluconate Cloth  6 each Topical Daily  . cholecalciferol  1,000 Units Oral Daily  . cyclobenzaprine  5 mg Oral TID  . diclofenac Sodium  2 g Topical QID  . docusate sodium  100 mg Oral BID  . dronabinol  2.5 mg Oral QAC supper  . exemestane  25 mg Oral QPC breakfast  . feeding supplement (ENSURE ENLIVE)  237 mL Oral TID BM  . fentaNYL      . fentaNYL      . ibuprofen  400 mg Oral TID  . ketorolac      . lidocaine (PF)      . lidocaine      . loratadine  10 mg Oral Daily  . losartan  12.5 mg Oral Daily  . midazolam      . midazolam      . multivitamin with minerals  1 tablet Oral Daily  . oxyCODONE  15 mg Oral Q12H  . pantoprazole  40 mg Oral Daily  . senna-docusate  2 tablet Oral BID   Continuous Infusions:    LOS: 8 days     Time spent: 50mins I have personally reviewed and interpreted on  09/11/2019 daily labs,  imagings as discussed above under date review session and assessment and plans.  I reviewed all nursing notes, pharmacy notes, consultant notes,  vitals, pertinent old records  I have discussed plan of care as described above with  patient  on 09/11/2019  Voice Recognition /Dragon dictation system was used to create this note, attempts have been made to correct errors. Please contact the author with questions and/or clarifications.   Florencia Reasons, MD PhD FACP [facility]  Available via Epic secure chat 7am-7pm for nonurgent issues Please page for urgent issues To page the attending provider between 7A-7P  or the covering provider during after hours 7P-7A, please log into the web site [email or web address] and access using universal [place] password for that web site. If you do not have the password, please call the hospital operator.    09/11/2019, 4:58 PM

## 2019-09-11 NOTE — Progress Notes (Signed)
MEDICATION-RELATED CONSULT NOTE   IR Procedure Consult - Anticoagulant/Antiplatelet PTA/Inpatient Med List Review by Pharmacist    Procedure:  fluoro guided Bx; RF ablation (Ostecool) and cement augmentation of the L1 and L5 vertebral bodies    Completed: 5/14 @ 0856  Post-Procedural bleeding risk per IR MD assessment:  high  Antithrombotic medications on inpatient or PTA profile prior to procedure:   Lovenox 40 mg    Recommended restart time per IR Post-Procedure Guidelines:     Other considerations:      Plan:    Resume Lovenox 40 mg daily 5/16.   Ulice Dash, PharmD, BCPS

## 2019-09-11 NOTE — Progress Notes (Signed)
VAST consulted to obtain IV access for pt to go to IR. When VAST RN available to assess vasculature, pt was already in IR and PICC placed.

## 2019-09-11 NOTE — Plan of Care (Signed)
  Problem: Health Behavior/Discharge Planning: Goal: Ability to manage health-related needs will improve Outcome: Progressing   Problem: Clinical Measurements: Goal: Ability to maintain clinical measurements within normal limits will improve Outcome: Progressing Goal: Will remain free from infection Outcome: Progressing Goal: Diagnostic test results will improve Outcome: Progressing   

## 2019-09-11 NOTE — Procedures (Signed)
Pre procedural Dx: Metastatic breast cancer; Symptomatic compression malignant compression fractures of L1 and L5. Post procedural Dx: Same  Technically fluoro guided Bx; RF ablation (Ostecool) and cement augmentation of the L1 and L5 vertebral bodies.  Patient is to lie flat for 3 hours.  EBL: Minimal Complications: None immediate.  Ronny Bacon, MD Pager #: [phone]

## 2019-09-11 NOTE — Procedures (Signed)
Pre procedural Diagnosis: Poor venous access Post Procedural Diagnosis: Same  Successful placement of left brachial vein approach single lumen PICC line with tip at the superior caval-atrial junction.    EBL: None  No immediate post procedural complication.  The PICC line is ready for immediate use.  Ronny Bacon, MD Pager #: [phone]

## 2019-09-12 LAB — BASIC METABOLIC PANEL
Anion gap: 8 (ref 5–15)
BUN: 15 mg/dL (ref 8–23)
CO2: 26 mmol/L (ref 22–32)
Calcium: 8.4 mg/dL — ABNORMAL LOW (ref 8.9–10.3)
Chloride: 97 mmol/L — ABNORMAL LOW (ref 98–111)
Creatinine, Ser: 0.5 mg/dL (ref 0.44–1.00)
GFR calc Af Amer: >60 mL/min (ref >60)
GFR calc non Af Amer: >60 mL/min (ref >60)
Glucose, Bld: 95 mg/dL (ref 70–99)
Potassium: 3.7 mmol/L (ref 3.5–5.1)
Sodium: 131 mmol/L — ABNORMAL LOW (ref 135–145)

## 2019-09-12 LAB — CBC
HCT: 32.4 % — ABNORMAL LOW (ref 36.0–46.0)
Hemoglobin: 10.4 g/dL — ABNORMAL LOW (ref 12.0–15.0)
MCH: 31.6 pg (ref 26.0–34.0)
MCHC: 32.1 g/dL (ref 30.0–36.0)
MCV: 98.5 fL (ref 80.0–100.0)
Platelets: 154 10*3/uL (ref 150–400)
RBC: 3.29 MIL/uL — ABNORMAL LOW (ref 3.87–5.11)
RDW: 14.6 % (ref 11.5–15.5)
WBC: 3.6 10*3/uL — ABNORMAL LOW (ref 4.0–10.5)
nRBC: 0 % (ref 0.0–0.2)

## 2019-09-12 LAB — MAGNESIUM: Magnesium: 1.9 mg/dL (ref 1.7–2.4)

## 2019-09-12 MED ORDER — SODIUM CHLORIDE 0.9% FLUSH: 10.0000 mL | INTRAVENOUS | Status: DC | PRN

## 2019-09-12 MED ORDER — POTASSIUM CHLORIDE CRYS ER 20 MEQ PO TBCR
40.0000 meq | EXTENDED_RELEASE_TABLET | Freq: Once | ORAL | Status: AC
  Administered 2019-09-12: 40 meq via ORAL
  Filled 2019-09-12: qty 2

## 2019-09-12 MED ORDER — CARVEDILOL 12.5 MG PO TABS
12.5000 mg | ORAL_TABLET | Freq: Once | ORAL | Status: AC
  Administered 2019-09-12: 12.5 mg via ORAL
  Filled 2019-09-12: qty 1

## 2019-09-12 MED ORDER — LOSARTAN POTASSIUM 25 MG PO TABS
25.0000 mg | ORAL_TABLET | Freq: Every day | ORAL | Status: DC
  Administered 2019-09-13: 25 mg via ORAL
  Filled 2019-09-12: qty 1

## 2019-09-12 MED ORDER — BISACODYL 10 MG RE SUPP
10.0000 mg | Freq: Every day | RECTAL | Status: DC
  Filled 2019-09-12 (×2): qty 1

## 2019-09-12 MED ORDER — IBUPROFEN 400 MG PO TABS: 400.0000 mg | ORAL_TABLET | Freq: Four times a day (QID) | ORAL | Status: DC | PRN

## 2019-09-12 MED ORDER — POLYETHYLENE GLYCOL 3350 17 G PO PACK: 17.0000 g | PACK | Freq: Every day | ORAL | Status: DC

## 2019-09-12 MED ORDER — HYDRALAZINE HCL 20 MG/ML IJ SOLN: 10.0000 mg | Freq: Four times a day (QID) | INTRAMUSCULAR | Status: DC | PRN

## 2019-09-12 MED ORDER — CARVEDILOL 12.5 MG PO TABS
12.5000 mg | ORAL_TABLET | Freq: Two times a day (BID) | ORAL | Status: DC
  Administered 2019-09-13: 12.5 mg via ORAL
  Filled 2019-09-12 (×2): qty 1

## 2019-09-12 NOTE — Progress Notes (Signed)
Supervising Physician: Sandi Mariscal  Patient Status:  [facility] - In-pt  Chief Complaint: Back pain  Subjective:  Resting comfortably. Working through [employer].  Initially she reports her back pain is unchanged, later in our conversation she states she is thrilled with the results of her procedure and her pain is improved. Reports she had some pain in her right leg this AM that was severe, but has since resolved.   She is alert and oriented.   Allergies: Ciprofloxacin  Medications: Prior to Admission medications   Medication Sig Start Date End Date Taking? Authorizing Provider  ALPRAZolam (XANAX) 0.25 MG tablet Take 1 tablet (0.25 mg total) by mouth 3 (three) times daily as needed. for anxiety 08/18/19  Yes Isaac Bliss, Rayford Halsted, MD  aluminum-magnesium hydroxide 200-200 MG/5ML suspension Take by mouth every 6 (six) hours as needed for indigestion.   Yes [provider]  aspirin EC 81 MG tablet Take 81 mg by mouth daily after breakfast.    Yes [provider]  carvedilol (COREG) 12.5 MG tablet Take 1 tablet (12.5 mg total) by mouth 2 (two) times daily with a meal. 06/05/19  Yes Isaac Bliss, Rayford Halsted, MD  Cholecalciferol (VITAMIN D PO) Take 1,000 Units by mouth daily.    Yes [provider]  docusate sodium (COLACE) 100 MG capsule Take 100 mg by mouth daily.   Yes [provider]  exemestane (AROMASIN) 25 MG tablet Take 1 tablet (25 mg total) by mouth daily after breakfast. 07/27/19  Yes Magrinat, Virgie Dad, MD  Glucosamine 500 MG CAPS Take 500 mg by mouth in the morning and at bedtime.    Yes [provider]  HYDROcodone-acetaminophen (NORCO/VICODIN) 5-325 MG tablet Take 1 tablet by mouth every 6 (six) hours as needed. Patient taking differently: Take 1 tablet by mouth every 6 (six) hours as needed for moderate pain.  08/31/19  Yes Magrinat, Virgie Dad, MD  loratadine (CLARITIN) 10 MG tablet Take 1 tablet (10 mg total) by mouth  daily. 04/16/19  Yes Isaac Bliss, Rayford Halsted, MD  losartan (COZAAR) 25 MG tablet Take 0.5 tablets (12.5 mg total) by mouth daily. 08/19/19  Yes Isaac Bliss, Rayford Halsted, MD  ondansetron (ZOFRAN-ODT) 4 MG disintegrating tablet Take 1 tablet (4 mg total) by mouth every 8 (eight) hours as needed for nausea or vomiting. 08/18/19  Yes Davonna Belling, MD  meclizine (ANTIVERT) 25 MG tablet Take 1 tablet (25 mg total) by mouth 3 (three) times daily as needed for dizziness. Patient not taking: Reported on 08/18/2019 07/14/19   Dutch Quint B, FNP     Vital Signs: BP (!) 172/80 (BP Location: Right Leg)   Pulse 88   Temp 98.2 F (36.8 C)   Resp 20   Ht _0  (1.549 m)   Wt 112 lb 10.5 oz (51.1 kg)   SpO2 94%   BMI 21.29 kg/m   Physical Exam Vitals and nursing note reviewed.    NAD, resting comfortably.  MSK: able to roll in bed.  Skin; procedure sites intact.   Imaging: IR Bone Tumor(s)RF Ablation  Result Date: 09/11/2019 CLINICAL DATA:  History of metastatic breast cancer, now with symptomatic L1 and L5 malignant compression fractures. Patient presents today for fluoroscopic guided L1 and L5 vertebral body bone biopsy, RF ablation and kyphoplasty/cement augmentation. EXAM: 1. FLUOROSCOPIC GUIDED L1 VERTEBRAL BODY BIOPSY, RF ABLATION AND KYPHOPLASTY/CEMENT AUGMENTATION. 2. FLUOROSCOPIC GUIDED L5 VERTEBRAL BODY BIOPSY, RF ABLATION AND KYPHOPLASTY/CEMENT AUGMENTATION. COMPARISON:  CT the chest,  abdomen pelvis-02/02/2019; lumbar spine MRI-09/08/2019 MEDICATIONS: Toradol 300 mg IV; Ancef 2 g IV; The antibiotic was administered in an appropriate time interval prior to needle puncture of the skin. ANESTHESIA/SEDATION: Moderate (conscious) sedation was employed during this procedure. A total of Versed 6 mg and Fentanyl 300 mcg was administered intravenously. Moderate Sedation Time: 85 minutes. The patient's level of consciousness and vital signs were monitored continuously by radiology nursing  throughout the procedure under my direct supervision. FLUOROSCOPY TIME:  17 minutes, 36 seconds (034 mGy) COMPLICATIONS: None immediate. TECHNIQUE: Informed written consent was obtained from the patient after a thorough discussion of the procedural risks, benefits and alternatives. All questions were addressed. Maximal Sterile Barrier Technique was utilized including caps, mask, sterile gowns, sterile gloves, sterile drape, hand hygiene and skin antiseptic. A timeout was performed prior to the initiation of the procedure. The patient was placed prone on the fluoroscopic table. The skin overlying the lumbar spine region was then prepped and draped in the usual sterile fashion. Maximal barrier sterile technique was utilized including caps, mask, sterile gowns, sterile gloves, sterile drape, hand hygiene and skin antiseptic. Intravenous Fentanyl and Versed were administered as conscious sedation during continuous cardiorespiratory monitoring by the radiology RN. The left pedicle at L5 was then infiltrated with 1% lidocaine followed by the advancement of a Kyphon trocar needle through the left pedicle into the posterior one-third of the vertebral body. The trocar was removed and a bone biopsy was obtained at this location. Subsequently, the osteo drill was advanced to the anterior third of the vertebral body. The osteo drill was retracted. Through the working cannula, a 20 mm Osetocool RF ablation probe was inserted and positioned under fluoroscopic guidance. In similar fashion, the right L5 pedicle was infiltrated with 1% lidocaine followed by the advancement of a second Kyphon trocar needle through the right pedicle into the posterior third of the vertebral body. Again, a bone biopsy was obtained at this location. Subsequently, the osteo drill was coaxially advanced to the anterior right third. The osteo drill was exchanged for a 20 mm Oseto cool RF ablation probe which was positioned under fluoroscopic guidance. With  both Osteocool ablation probes in place, the L5 ablation was performed for 15 minutes. ------------------------------------------------------------------------ While the L5 ablation was being performed, attention was paid towards the L1 vertebral body. The left pedicle at L1 was then infiltrated with 1% lidocaine followed by the advancement of a Kyphon trocar needle through the left pedicle into the posterior one-third of the vertebral body. The trocar was removed and a bone biopsy was obtained at this location. Subsequently, the osteo drill was advanced to the anterior third of the vertebral body. The osteo drill was retracted. Through the working cannula, a 15 mm Osetocool RF ablation probe was inserted and positioned under fluoroscopic guidance. In similar fashion, the right L1 pedicle was infiltrated with 1% lidocaine followed by the advancement of a second Kyphon trocar needle through the right pedicle into the posterior third of the vertebral body. Again, a bone biopsy was obtained at this location. Subsequently, the osteo drill was coaxially advanced to the anterior right third. The osteo drill was exchanged for a 15 mm Oseto cool RF ablation probe which was positioned under fluoroscopic guidance. With both Osteocool ablation probes in place, the L1 ablation was performed for 11.5 minutes. ------------------------------------------------------------------------ Attention was now paid towards the kyphoplasty portion of the procedure. Beginning at the L5 vertebral body level, Kyphon balloons measuring 15 mm in length were advanced through both  working cannulas and positioned with the distal markers approximately 5 mm from the anterior aspect of the cortex at both the L1 and L5 vertebral body levels. Appropriate positioning was confirmed on the AP projection. At this time, both balloons were expanded using contrast via a Kyphon inflation syringe device via micro tubing. At this time, methylmethacrylate mixture  was reconstituted in the Kyphon bone mixing device system. This was then loaded into the delivery mechanism, attached to Kyphon bone fillers. The balloons were deflated and removed followed by the instillation of methylmethacrylate mixture with excellent filling in the AP and lateral projections beginning at the L1 and subsequently at the L5 vertebral body. The working cannulae and the bone filler were then retrieved and removed. Multiple spot radiographic images were obtained in various obliquities. Hemostasis was achieved with manual compression. The patient tolerated the procedure well without immediate postprocedural complication though not surprising was uncomfortable during the kyphoplasty portion of the procedure, especially at the L5 vertebral body. FINDINGS: Completion images demonstrate a technically excellent result with adequate cement filling of the L1 vertebral body both on the AP and lateral radiographs. No extravasation is noted in the disc space are posteriorly into the spinal canal. No epidural venous contamination seen. There is adequate filling of the L5 vertebral body, primarily at the location of the kyphoplasty balloons. A small amount cement is noted potentially external to the left lateral aspect of the L5 vertebral body. No extravasation is noted within the disc space or posteriorly into the spinal canal. IMPRESSION: 1. Technically successful L1 and L5 vertebral body ablation and cement augmentation using balloon kyphoplasty. 2. Technically successful L1 and L5 vertebral body biopsy. PLAN: - Continued in patient management as per the providing clinical team. - The patient will be seen in follow-up consultation at the IR clinic in approximately 3-4 weeks. Electronically Signed   By: Sandi Mariscal M.D.   On: 09/11/2019 17:43   IR Bone Tumor(s)RF Ablation  Result Date: 09/11/2019 CLINICAL DATA:  History of metastatic breast cancer, now with symptomatic L1 and L5 malignant compression  fractures. Patient presents today for fluoroscopic guided L1 and L5 vertebral body bone biopsy, RF ablation and kyphoplasty/cement augmentation. EXAM: 1. FLUOROSCOPIC GUIDED L1 VERTEBRAL BODY BIOPSY, RF ABLATION AND KYPHOPLASTY/CEMENT AUGMENTATION. 2. FLUOROSCOPIC GUIDED L5 VERTEBRAL BODY BIOPSY, RF ABLATION AND KYPHOPLASTY/CEMENT AUGMENTATION. COMPARISON:  CT the chest, abdomen pelvis-02/02/2019; lumbar spine MRI-09/08/2019 MEDICATIONS: Toradol 300 mg IV; Ancef 2 g IV; The antibiotic was administered in an appropriate time interval prior to needle puncture of the skin. ANESTHESIA/SEDATION: Moderate (conscious) sedation was employed during this procedure. A total of Versed 6 mg and Fentanyl 300 mcg was administered intravenously. Moderate Sedation Time: 85 minutes. The patient's level of consciousness and vital signs were monitored continuously by radiology nursing throughout the procedure under my direct supervision. FLUOROSCOPY TIME:  17 minutes, 36 seconds (193 mGy) COMPLICATIONS: None immediate. TECHNIQUE: Informed written consent was obtained from the patient after a thorough discussion of the procedural risks, benefits and alternatives. All questions were addressed. Maximal Sterile Barrier Technique was utilized including caps, mask, sterile gowns, sterile gloves, sterile drape, hand hygiene and skin antiseptic. A timeout was performed prior to the initiation of the procedure. The patient was placed prone on the fluoroscopic table. The skin overlying the lumbar spine region was then prepped and draped in the usual sterile fashion. Maximal barrier sterile technique was utilized including caps, mask, sterile gowns, sterile gloves, sterile drape, hand hygiene and skin antiseptic. Intravenous Fentanyl and Versed  were administered as conscious sedation during continuous cardiorespiratory monitoring by the radiology RN. The left pedicle at L5 was then infiltrated with 1% lidocaine followed by the advancement of a  Kyphon trocar needle through the left pedicle into the posterior one-third of the vertebral body. The trocar was removed and a bone biopsy was obtained at this location. Subsequently, the osteo drill was advanced to the anterior third of the vertebral body. The osteo drill was retracted. Through the working cannula, a 20 mm Osetocool RF ablation probe was inserted and positioned under fluoroscopic guidance. In similar fashion, the right L5 pedicle was infiltrated with 1% lidocaine followed by the advancement of a second Kyphon trocar needle through the right pedicle into the posterior third of the vertebral body. Again, a bone biopsy was obtained at this location. Subsequently, the osteo drill was coaxially advanced to the anterior right third. The osteo drill was exchanged for a 20 mm Oseto cool RF ablation probe which was positioned under fluoroscopic guidance. With both Osteocool ablation probes in place, the L5 ablation was performed for 15 minutes. ------------------------------------------------------------------------ While the L5 ablation was being performed, attention was paid towards the L1 vertebral body. The left pedicle at L1 was then infiltrated with 1% lidocaine followed by the advancement of a Kyphon trocar needle through the left pedicle into the posterior one-third of the vertebral body. The trocar was removed and a bone biopsy was obtained at this location. Subsequently, the osteo drill was advanced to the anterior third of the vertebral body. The osteo drill was retracted. Through the working cannula, a 15 mm Osetocool RF ablation probe was inserted and positioned under fluoroscopic guidance. In similar fashion, the right L1 pedicle was infiltrated with 1% lidocaine followed by the advancement of a second Kyphon trocar needle through the right pedicle into the posterior third of the vertebral body. Again, a bone biopsy was obtained at this location. Subsequently, the osteo drill was coaxially  advanced to the anterior right third. The osteo drill was exchanged for a 15 mm Oseto cool RF ablation probe which was positioned under fluoroscopic guidance. With both Osteocool ablation probes in place, the L1 ablation was performed for 11.5 minutes. ------------------------------------------------------------------------ Attention was now paid towards the kyphoplasty portion of the procedure. Beginning at the L5 vertebral body level, Kyphon balloons measuring 15 mm in length were advanced through both working cannulas and positioned with the distal markers approximately 5 mm from the anterior aspect of the cortex at both the L1 and L5 vertebral body levels. Appropriate positioning was confirmed on the AP projection. At this time, both balloons were expanded using contrast via a Kyphon inflation syringe device via micro tubing. At this time, methylmethacrylate mixture was reconstituted in the Kyphon bone mixing device system. This was then loaded into the delivery mechanism, attached to Kyphon bone fillers. The balloons were deflated and removed followed by the instillation of methylmethacrylate mixture with excellent filling in the AP and lateral projections beginning at the L1 and subsequently at the L5 vertebral body. The working cannulae and the bone filler were then retrieved and removed. Multiple spot radiographic images were obtained in various obliquities. Hemostasis was achieved with manual compression. The patient tolerated the procedure well without immediate postprocedural complication though not surprising was uncomfortable during the kyphoplasty portion of the procedure, especially at the L5 vertebral body. FINDINGS: Completion images demonstrate a technically excellent result with adequate cement filling of the L1 vertebral body both on the AP and lateral radiographs. No extravasation is  noted in the disc space are posteriorly into the spinal canal. No epidural venous contamination seen. There is  adequate filling of the L5 vertebral body, primarily at the location of the kyphoplasty balloons. A small amount cement is noted potentially external to the left lateral aspect of the L5 vertebral body. No extravasation is noted within the disc space or posteriorly into the spinal canal. IMPRESSION: 1. Technically successful L1 and L5 vertebral body ablation and cement augmentation using balloon kyphoplasty. 2. Technically successful L1 and L5 vertebral body biopsy. PLAN: - Continued in patient management as per the providing clinical team. - The patient will be seen in follow-up consultation at the IR clinic in approximately 3-4 weeks. Electronically Signed   By: Sandi Mariscal M.D.   On: 09/11/2019 17:43   IR KYPHO LUMBAR INC FX REDUCE BONE BX UNI/BIL CANNULATION INC/IMAGING  Result Date: 09/11/2019 CLINICAL DATA:  History of metastatic breast cancer, now with symptomatic L1 and L5 malignant compression fractures. Patient presents today for fluoroscopic guided L1 and L5 vertebral body bone biopsy, RF ablation and kyphoplasty/cement augmentation. EXAM: 1. FLUOROSCOPIC GUIDED L1 VERTEBRAL BODY BIOPSY, RF ABLATION AND KYPHOPLASTY/CEMENT AUGMENTATION. 2. FLUOROSCOPIC GUIDED L5 VERTEBRAL BODY BIOPSY, RF ABLATION AND KYPHOPLASTY/CEMENT AUGMENTATION. COMPARISON:  CT the chest, abdomen pelvis-02/02/2019; lumbar spine MRI-09/08/2019 MEDICATIONS: Toradol 300 mg IV; Ancef 2 g IV; The antibiotic was administered in an appropriate time interval prior to needle puncture of the skin. ANESTHESIA/SEDATION: Moderate (conscious) sedation was employed during this procedure. A total of Versed 6 mg and Fentanyl 300 mcg was administered intravenously. Moderate Sedation Time: 85 minutes. The patient's level of consciousness and vital signs were monitored continuously by radiology nursing throughout the procedure under my direct supervision. FLUOROSCOPY TIME:  17 minutes, 36 seconds (161 mGy) COMPLICATIONS: None immediate. TECHNIQUE:  Informed written consent was obtained from the patient after a thorough discussion of the procedural risks, benefits and alternatives. All questions were addressed. Maximal Sterile Barrier Technique was utilized including caps, mask, sterile gowns, sterile gloves, sterile drape, hand hygiene and skin antiseptic. A timeout was performed prior to the initiation of the procedure. The patient was placed prone on the fluoroscopic table. The skin overlying the lumbar spine region was then prepped and draped in the usual sterile fashion. Maximal barrier sterile technique was utilized including caps, mask, sterile gowns, sterile gloves, sterile drape, hand hygiene and skin antiseptic. Intravenous Fentanyl and Versed were administered as conscious sedation during continuous cardiorespiratory monitoring by the radiology RN. The left pedicle at L5 was then infiltrated with 1% lidocaine followed by the advancement of a Kyphon trocar needle through the left pedicle into the posterior one-third of the vertebral body. The trocar was removed and a bone biopsy was obtained at this location. Subsequently, the osteo drill was advanced to the anterior third of the vertebral body. The osteo drill was retracted. Through the working cannula, a 20 mm Osetocool RF ablation probe was inserted and positioned under fluoroscopic guidance. In similar fashion, the right L5 pedicle was infiltrated with 1% lidocaine followed by the advancement of a second Kyphon trocar needle through the right pedicle into the posterior third of the vertebral body. Again, a bone biopsy was obtained at this location. Subsequently, the osteo drill was coaxially advanced to the anterior right third. The osteo drill was exchanged for a 20 mm Oseto cool RF ablation probe which was positioned under fluoroscopic guidance. With both Osteocool ablation probes in place, the L5 ablation was performed for 15 minutes.  ------------------------------------------------------------------------ While  the L5 ablation was being performed, attention was paid towards the L1 vertebral body. The left pedicle at L1 was then infiltrated with 1% lidocaine followed by the advancement of a Kyphon trocar needle through the left pedicle into the posterior one-third of the vertebral body. The trocar was removed and a bone biopsy was obtained at this location. Subsequently, the osteo drill was advanced to the anterior third of the vertebral body. The osteo drill was retracted. Through the working cannula, a 15 mm Osetocool RF ablation probe was inserted and positioned under fluoroscopic guidance. In similar fashion, the right L1 pedicle was infiltrated with 1% lidocaine followed by the advancement of a second Kyphon trocar needle through the right pedicle into the posterior third of the vertebral body. Again, a bone biopsy was obtained at this location. Subsequently, the osteo drill was coaxially advanced to the anterior right third. The osteo drill was exchanged for a 15 mm Oseto cool RF ablation probe which was positioned under fluoroscopic guidance. With both Osteocool ablation probes in place, the L1 ablation was performed for 11.5 minutes. ------------------------------------------------------------------------ Attention was now paid towards the kyphoplasty portion of the procedure. Beginning at the L5 vertebral body level, Kyphon balloons measuring 15 mm in length were advanced through both working cannulas and positioned with the distal markers approximately 5 mm from the anterior aspect of the cortex at both the L1 and L5 vertebral body levels. Appropriate positioning was confirmed on the AP projection. At this time, both balloons were expanded using contrast via a Kyphon inflation syringe device via micro tubing. At this time, methylmethacrylate mixture was reconstituted in the Kyphon bone mixing device system. This was then loaded into the  delivery mechanism, attached to Kyphon bone fillers. The balloons were deflated and removed followed by the instillation of methylmethacrylate mixture with excellent filling in the AP and lateral projections beginning at the L1 and subsequently at the L5 vertebral body. The working cannulae and the bone filler were then retrieved and removed. Multiple spot radiographic images were obtained in various obliquities. Hemostasis was achieved with manual compression. The patient tolerated the procedure well without immediate postprocedural complication though not surprising was uncomfortable during the kyphoplasty portion of the procedure, especially at the L5 vertebral body. FINDINGS: Completion images demonstrate a technically excellent result with adequate cement filling of the L1 vertebral body both on the AP and lateral radiographs. No extravasation is noted in the disc space are posteriorly into the spinal canal. No epidural venous contamination seen. There is adequate filling of the L5 vertebral body, primarily at the location of the kyphoplasty balloons. A small amount cement is noted potentially external to the left lateral aspect of the L5 vertebral body. No extravasation is noted within the disc space or posteriorly into the spinal canal. IMPRESSION: 1. Technically successful L1 and L5 vertebral body ablation and cement augmentation using balloon kyphoplasty. 2. Technically successful L1 and L5 vertebral body biopsy. PLAN: - Continued in patient management as per the providing clinical team. - The patient will be seen in follow-up consultation at the IR clinic in approximately 3-4 weeks. Electronically Signed   By: Sandi Mariscal M.D.   On: 09/11/2019 17:43   IR KYPHO EA ADDL LEVEL THORACIC OR LUMBAR  Result Date: 09/11/2019 CLINICAL DATA:  History of metastatic breast cancer, now with symptomatic L1 and L5 malignant compression fractures. Patient presents today for fluoroscopic guided L1 and L5 vertebral body  bone biopsy, RF ablation and kyphoplasty/cement augmentation. EXAM: 1. FLUOROSCOPIC GUIDED L1  VERTEBRAL BODY BIOPSY, RF ABLATION AND KYPHOPLASTY/CEMENT AUGMENTATION. 2. FLUOROSCOPIC GUIDED L5 VERTEBRAL BODY BIOPSY, RF ABLATION AND KYPHOPLASTY/CEMENT AUGMENTATION. COMPARISON:  CT the chest, abdomen pelvis-02/02/2019; lumbar spine MRI-09/08/2019 MEDICATIONS: Toradol 300 mg IV; Ancef 2 g IV; The antibiotic was administered in an appropriate time interval prior to needle puncture of the skin. ANESTHESIA/SEDATION: Moderate (conscious) sedation was employed during this procedure. A total of Versed 6 mg and Fentanyl 300 mcg was administered intravenously. Moderate Sedation Time: 85 minutes. The patient's level of consciousness and vital signs were monitored continuously by radiology nursing throughout the procedure under my direct supervision. FLUOROSCOPY TIME:  17 minutes, 36 seconds (324 mGy) COMPLICATIONS: None immediate. TECHNIQUE: Informed written consent was obtained from the patient after a thorough discussion of the procedural risks, benefits and alternatives. All questions were addressed. Maximal Sterile Barrier Technique was utilized including caps, mask, sterile gowns, sterile gloves, sterile drape, hand hygiene and skin antiseptic. A timeout was performed prior to the initiation of the procedure. The patient was placed prone on the fluoroscopic table. The skin overlying the lumbar spine region was then prepped and draped in the usual sterile fashion. Maximal barrier sterile technique was utilized including caps, mask, sterile gowns, sterile gloves, sterile drape, hand hygiene and skin antiseptic. Intravenous Fentanyl and Versed were administered as conscious sedation during continuous cardiorespiratory monitoring by the radiology RN. The left pedicle at L5 was then infiltrated with 1% lidocaine followed by the advancement of a Kyphon trocar needle through the left pedicle into the posterior one-third of the  vertebral body. The trocar was removed and a bone biopsy was obtained at this location. Subsequently, the osteo drill was advanced to the anterior third of the vertebral body. The osteo drill was retracted. Through the working cannula, a 20 mm Osetocool RF ablation probe was inserted and positioned under fluoroscopic guidance. In similar fashion, the right L5 pedicle was infiltrated with 1% lidocaine followed by the advancement of a second Kyphon trocar needle through the right pedicle into the posterior third of the vertebral body. Again, a bone biopsy was obtained at this location. Subsequently, the osteo drill was coaxially advanced to the anterior right third. The osteo drill was exchanged for a 20 mm Oseto cool RF ablation probe which was positioned under fluoroscopic guidance. With both Osteocool ablation probes in place, the L5 ablation was performed for 15 minutes. ------------------------------------------------------------------------ While the L5 ablation was being performed, attention was paid towards the L1 vertebral body. The left pedicle at L1 was then infiltrated with 1% lidocaine followed by the advancement of a Kyphon trocar needle through the left pedicle into the posterior one-third of the vertebral body. The trocar was removed and a bone biopsy was obtained at this location. Subsequently, the osteo drill was advanced to the anterior third of the vertebral body. The osteo drill was retracted. Through the working cannula, a 15 mm Osetocool RF ablation probe was inserted and positioned under fluoroscopic guidance. In similar fashion, the right L1 pedicle was infiltrated with 1% lidocaine followed by the advancement of a second Kyphon trocar needle through the right pedicle into the posterior third of the vertebral body. Again, a bone biopsy was obtained at this location. Subsequently, the osteo drill was coaxially advanced to the anterior right third. The osteo drill was exchanged for a 15 mm Oseto  cool RF ablation probe which was positioned under fluoroscopic guidance. With both Osteocool ablation probes in place, the L1 ablation was performed for 11.5 minutes. ------------------------------------------------------------------------ Attention was now paid  towards the kyphoplasty portion of the procedure. Beginning at the L5 vertebral body level, Kyphon balloons measuring 15 mm in length were advanced through both working cannulas and positioned with the distal markers approximately 5 mm from the anterior aspect of the cortex at both the L1 and L5 vertebral body levels. Appropriate positioning was confirmed on the AP projection. At this time, both balloons were expanded using contrast via a Kyphon inflation syringe device via micro tubing. At this time, methylmethacrylate mixture was reconstituted in the Kyphon bone mixing device system. This was then loaded into the delivery mechanism, attached to Kyphon bone fillers. The balloons were deflated and removed followed by the instillation of methylmethacrylate mixture with excellent filling in the AP and lateral projections beginning at the L1 and subsequently at the L5 vertebral body. The working cannulae and the bone filler were then retrieved and removed. Multiple spot radiographic images were obtained in various obliquities. Hemostasis was achieved with manual compression. The patient tolerated the procedure well without immediate postprocedural complication though not surprising was uncomfortable during the kyphoplasty portion of the procedure, especially at the L5 vertebral body. FINDINGS: Completion images demonstrate a technically excellent result with adequate cement filling of the L1 vertebral body both on the AP and lateral radiographs. No extravasation is noted in the disc space are posteriorly into the spinal canal. No epidural venous contamination seen. There is adequate filling of the L5 vertebral body, primarily at the location of the kyphoplasty  balloons. A small amount cement is noted potentially external to the left lateral aspect of the L5 vertebral body. No extravasation is noted within the disc space or posteriorly into the spinal canal. IMPRESSION: 1. Technically successful L1 and L5 vertebral body ablation and cement augmentation using balloon kyphoplasty. 2. Technically successful L1 and L5 vertebral body biopsy. PLAN: - Continued in patient management as per the providing clinical team. - The patient will be seen in follow-up consultation at the IR clinic in approximately 3-4 weeks. Electronically Signed   By: Sandi Mariscal M.D.   On: 09/11/2019 17:43   [place] PLACEMENT LEFT >5 YRS INC IMG GUIDE  Result Date: 09/11/2019 INDICATION: Poor venous access. In need of durable intravenous access for medication and blood draws while hospitalized. EXAM: ULTRASOUND AND FLUOROSCOPIC GUIDED PICC LINE INSERTION MEDICATIONS: None. CONTRAST:  None FLUOROSCOPY TIME:  1 minute, 42 seconds (3 mGy) COMPLICATIONS: None immediate. TECHNIQUE: The procedure, risks, benefits, and alternatives were explained to the patient and informed written consent was obtained. A timeout was performed prior to the initiation of the procedure. The left upper extremity was prepped with chlorhexidine in a sterile fashion, and a sterile drape was applied covering the operative field. Maximum barrier sterile technique with sterile gowns and gloves were used for the procedure. A timeout was performed prior to the initiation of the procedure. Local anesthesia was provided with 1% lidocaine. Under direct ultrasound guidance, the brachial vein was accessed with a micropuncture kit after the overlying soft tissues were anesthetized with 1% lidocaine. After the overlying soft tissues were anesthetized, a small venotomy incision was created and a micropuncture kit was utilized to access the left brachial vein. Real-time ultrasound guidance was utilized for vascular access including the  acquisition of a permanent ultrasound image documenting patency of the accessed vessel. A guidewire was advanced to the level of the superior caval-atrial junction for measurement purposes and the PICC line was cut to length. A peel-away sheath was placed and a 39 cm, 5 [place], single lumen  was inserted to level of the superior caval-atrial junction. A post procedure spot fluoroscopic was obtained. The catheter easily aspirated and flushed and was secured in place with stat lock device. A dressing was applied. The patient tolerated the procedure well without immediate post procedural complication. FINDINGS: After catheter placement, the tip lies within the superior cavoatrial junction. The catheter aspirates and flushes normally and is ready for immediate use. IMPRESSION: Successful ultrasound and fluoroscopic guided placement of a left brachial vein approach, 39 cm, 5 [place], single lumen PICC with tip at the superior caval-atrial junction. The PICC line is ready for immediate use. Electronically Signed   By: Sandi Mariscal M.D.   On: 09/11/2019 17:45    Labs:  CBC: Recent Labs    09/02/19 2351 09/02/19 2351 09/03/19 0000 09/08/19 0249 09/11/19 0319 09/12/19 0329  WBC 4.1  --   --  4.3 3.2* 3.6*  HGB 11.7*   < > 11.9* 11.4* 10.5* 10.4*  HCT 35.6*   < > 35.0* 35.1* 32.7* 32.4*  PLT 160  --   --  149* 154 154   < > = values in this interval not displayed.    COAGS: Recent Labs    01/29/19 1048 09/07/19 0910  INR 1.1 1.0  APTT 28  --     BMP: Recent Labs    09/05/19 0338 09/08/19 0249 09/11/19 0319 09/12/19 0329  NA 135 135 132* 131*  K 4.1 4.3 4.1 3.7  CL 102 99 96* 97*  CO2 _0 GLUCOSE 103* 114* 102* 95  BUN _1 CALCIUM 8.1* 8.7* 8.7* 8.4*  CREATININE 0.52 0.57 0.61 0.50  GFRNONAA >60 >60 >60 >60  GFRAA >60 >60 >60 >60    LIVER FUNCTION TESTS: Recent Labs    01/23/19 1516 01/29/19 0855 01/29/19 1048 06/24/19 0808  BILITOT 0.4 0.3 0.6 0.5  AST _2 ALT _3 ALKPHOS 82 82 73 114  PROT 7.2 7.2 7.1 7.0  ALBUMIN 4.0 3.7 4.0 3.8    Assessment and Plan: L1 and L5 compression fractures Patient assessed at bedside this afternoon.   Difficult to tell whether she has pain relief from the procedure, however this appears to be in part due to the extent of her pain and difficulty in distinguishing her compression fracture pain from additional cancer-related pain.  She is otherwise recovering well.  Stable from a procedure standpoint.  Follow-up has been ordered.  Patient aware.   Electronically Signed: Docia Barrier, PA 09/12/2019, 2:23 PM   I spent a total of 15 Minutes at the the patient's bedside AND on the patient's hospital floor or unit, greater than 50% of which was counseling/coordinating care for L1 and L5 compression fractures.

## 2019-09-12 NOTE — Progress Notes (Signed)
PROGRESS NOTE    Erica Young  ZOX:[id number] DOB: 04-26-1941 DOA: 09/02/2019 PCP: Isaac Bliss, Rayford Halsted, MD  No chief complaint on file.   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 Radiology has been consulted. Their recommendation is for the patient to go for Osteocool ablation prior to kyphoplasty. Radiology is seeking insurance approval for this procedure. It is anticipated that the patient would be scheduled for the procedure on Friday.  The patient has been evaluated by PT/OT. They have recommended SNF.  Subjective:  Post kyphoplasty day one , report some improvement in pain, but still reluctant to get up Still  requires prn pain meds with poor oral intake bp elevated  Assessment & Plan:   Principal Problem:   Vertebral compression fracture (HCC) Active Problems:   HTN (hypertension)   Metastatic breast cancer ([place])   Bone metastases ([place])   Hypoxia   Compression fracture of L1 lumbar vertebra (HCC)   Malnutrition of moderate degree  Acute pathologic fracture at L1 and L5 She has been fitted for a TLSO brace Pain control S/p RF ablation (Ostecool) and cement augmentation of the L1 and L5 vertebral bodies on 5/14 IRput appreciated,  Resume Lovenox prophylaxis on May 16 if patient remains in hospital  Intravascular dehydration with hypokalemia with constipation and dyspepsia Aggressive bowel regimen, IV fluids as needed. Encourage oral intake Dulcolax suppository as needed.  She is having BM, potassium is normal   Bilateral atelectasis with transient hypoxemia: Encourage use of incentive spirometer She is on room air at rest  Essential hypertension: Coreg and losartan   Stage  IV metastatic breast cancer/liver and bone: Status post radiation, continue exemestane. Follows with Dr. Jana Hakim  Non-severe (moderate) malnutrition in context of chronic illness With poor appetite, Only consume 10-25% of the meal, Nutrition input appreciated, start Marinol for appetite stimulant  Body mass index is 21.29 kg/m.   DVT prophylaxis: Lovenox on hold  Due to needing  IR intervention Code Status: DNR Family Communication: patient Disposition:   Status is: Inpatient  Dispo: The patient is from: home              Anticipated d/c is to:snf              Anticipated d/c date is: 5/16 if pain improves, order covid screening today, need to remove picc line prior to discharge                  Consultants:   IR   oncology  Procedures:  RF ablation (Ostecool) and cement augmentation of the L1 and L5 vertebral bodies.on 5/14 picc line placement by IR on 5/14  Antimicrobials:   Ancefx1 for IR procedure on May 14     Objective: Vitals:   09/11/19 1941 09/11/19 2110 09/12/19 0110 09/12/19 1105  BP: 124/80 (!) 182/89 (!) 160/93 (!) 172/80  Pulse: 94 98 85 88  Resp: _0 Temp: 97.8 F (36.6 C) 98 F (36.7 C) (!) 97.5 F (36.4 C) 98.2 F (36.8 C)  TempSrc: Oral Oral Oral   SpO2: 92% 92% 95% 94%  Weight:      Height:        Intake/Output Summary (Last 24 hours) at 09/12/2019 1255 Last data filed at 09/12/2019 1000  Gross per 24 hour  Intake 120 ml  Output 800 ml  Net -680 ml   Filed Weights   09/02/19 2326 09/10/19 2246  Weight: 52.2 kg 51.1 kg    Examination:  General exam: Frail and weak, not in acute distress Respiratory system: Diminished at bases , no wheezing, no rhonchi no rales  Cardiovascular system: S1 & S2 heard, RRR. No JVD, no murmurs. No pedal edema. Gastrointestinal system: Abdomen is nondistended, soft and nontender. No organomegaly or masses felt. Normal bowel sounds heard. Central nervous system: Alert and oriented. No focal  neurological deficits. Extremities: Symmetric , sensation in tact Skin: No rashes, lesions or ulcers Psychiatry: Judgement and insight appear normal. Mood & affect appropriate.     Data Reviewed: I have personally reviewed following labs and imaging studies  CBC: Recent Labs  Lab 09/08/19 0249 09/11/19 0319 09/12/19 0329  WBC 4.3 3.2* 3.6*  HGB 11.4* 10.5* 10.4*  HCT 35.1* 32.7* 32.4*  MCV 97.0 99.1 98.5  PLT 149* 154 962    Basic Metabolic Panel: Recent Labs  Lab 09/08/19 0249 09/11/19 0319 09/12/19 0329  NA 135 132* 131*  K 4.3 4.1 3.7  CL 99 96* 97*  CO2 _0 GLUCOSE 114* 102* 95  BUN _1 CREATININE 0.57 0.61 0.50  CALCIUM 8.7* 8.7* 8.4*  MG 2.0 2.0 1.9    GFR: Estimated Creatinine Clearance: 43 mL/min (by C-G formula based on SCr of 0.5 mg/dL).  Liver Function Tests: No results for input(s): AST, ALT, ALKPHOS, BILITOT, PROT, ALBUMIN in the last 168 hours.  CBG: No results for input(s): GLUCAP in the last 168 hours.   Recent Results (from the past 240 hour(s))  Respiratory Panel by RT PCR (Flu A&B, Covid) - Nasopharyngeal Swab     Status: None   Collection Time: 09/02/19 11:51 PM   Specimen: Nasopharyngeal Swab  Result Value Ref Range Status   SARS Coronavirus 2 by RT PCR NEGATIVE NEGATIVE Final    Comment: (NOTE) SARS-CoV-2 target nucleic acids are NOT DETECTED. The SARS-CoV-2 RNA is generally detectable in upper respiratoy specimens during the acute phase of infection. The lowest concentration of SARS-CoV-2 viral copies this assay can detect is 131 copies/mL. A negative result does not preclude SARS-Cov-2 infection and should not be used as the sole basis for treatment or other patient management decisions. A negative result may occur with  improper specimen collection/handling, submission of specimen other than nasopharyngeal swab, presence of viral mutation(s) within the areas targeted by this assay, and inadequate number of viral  copies (<131 copies/mL). A negative result must be combined with clinical observations, patient history, and epidemiological information. The expected result is Negative. Fact Sheet for Patients:  PinkCheek.be Fact Sheet for Healthcare Providers:  GravelBags.it This test is not yet ap proved or cleared by the [place] FDA and  has been authorized for detection and/or diagnosis of SARS-CoV-2 by FDA under an Emergency Use Authorization (EUA). This EUA will remain  in effect (meaning this test can be used) for the duration of the COVID-19 declaration under Section 564(b)(1) of the Act, 21 U.S.C. section 360bbb-3(b)(1), unless the authorization is terminated or revoked sooner.    Influenza A by PCR NEGATIVE NEGATIVE Final   Influenza B by PCR NEGATIVE NEGATIVE Final    Comment: (NOTE) The Xpert Xpress SARS-CoV-2/FLU/RSV assay is intended as an aid in  the diagnosis of influenza from Nasopharyngeal swab specimens and  should not be used as a sole  basis for treatment. Nasal washings and  aspirates are unacceptable for Xpert Xpress SARS-CoV-2/FLU/RSV  testing. Fact Sheet for Patients: PinkCheek.be Fact Sheet for Healthcare Providers: GravelBags.it This test is not yet approved or cleared by the [place] FDA and  has been authorized for detection and/or diagnosis of SARS-CoV-2 by  FDA under an Emergency Use Authorization (EUA). This EUA will remain  in effect (meaning this test can be used) for the duration of the  Covid-19 declaration under Section 564(b)(1) of the Act, 21  U.S.C. section 360bbb-3(b)(1), unless the authorization is  terminated or revoked. Performed at [facility], [place] [address], [place], [place] [address]   Urine Culture     Status: Abnormal   Collection Time: 09/09/19 11:36 AM   Specimen: Urine, Clean Catch  Result Value  Ref Range Status   Specimen Description   Final    URINE, CLEAN CATCH Performed at [facility], [place] [address] [place], [place] [address]    Special Requests   Final    NONE Performed at [facility], [place] [address] [place], [place] [address]    Culture MULTIPLE SPECIES PRESENT, SUGGEST RECOLLECTION (A)  Final   Report Status 09/10/2019 FINAL  Final         Radiology Studies: IR Bone Tumor(s)RF Ablation  Result Date: 09/11/2019 CLINICAL DATA:  History of metastatic breast cancer, now with symptomatic L1 and L5 malignant compression fractures. Patient presents today for fluoroscopic guided L1 and L5 vertebral body bone biopsy, RF ablation and kyphoplasty/cement augmentation. EXAM: 1. FLUOROSCOPIC GUIDED L1 VERTEBRAL BODY BIOPSY, RF ABLATION AND KYPHOPLASTY/CEMENT AUGMENTATION. 2. FLUOROSCOPIC GUIDED L5 VERTEBRAL BODY BIOPSY, RF ABLATION AND KYPHOPLASTY/CEMENT AUGMENTATION. COMPARISON:  CT the chest, abdomen pelvis-02/02/2019; lumbar spine MRI-09/08/2019 MEDICATIONS: Toradol 300 mg IV; Ancef 2 g IV; The antibiotic was administered in an appropriate time interval prior to needle puncture of the skin. ANESTHESIA/SEDATION: Moderate (conscious) sedation was employed during this procedure. A total of Versed 6 mg and Fentanyl 300 mcg was administered intravenously. Moderate Sedation Time: 85 minutes. The patient's level of consciousness and vital signs were monitored continuously by radiology nursing throughout the procedure under my direct supervision. FLUOROSCOPY TIME:  17 minutes, 36 seconds (914 mGy) COMPLICATIONS: None immediate. TECHNIQUE: Informed written consent was obtained from the patient after a thorough discussion of the procedural risks, benefits and alternatives. All questions were addressed. Maximal Sterile Barrier Technique was utilized including caps, mask, sterile gowns, sterile gloves, sterile drape, hand hygiene and skin antiseptic. A timeout  was performed prior to the initiation of the procedure. The patient was placed prone on the fluoroscopic table. The skin overlying the lumbar spine region was then prepped and draped in the usual sterile fashion. Maximal barrier sterile technique was utilized including caps, mask, sterile gowns, sterile gloves, sterile drape, hand hygiene and skin antiseptic. Intravenous Fentanyl and Versed were administered as conscious sedation during continuous cardiorespiratory monitoring by the radiology RN. The left pedicle at L5 was then infiltrated with 1% lidocaine followed by the advancement of a Kyphon trocar needle through the left pedicle into the posterior one-third of the vertebral body. The trocar was removed and a bone biopsy was obtained at this location. Subsequently, the osteo drill was advanced to the anterior third of the vertebral body. The osteo drill was retracted. Through the working cannula, a 20 mm Osetocool RF ablation probe was inserted and positioned under fluoroscopic guidance. In similar fashion, the right L5 pedicle was infiltrated with 1% lidocaine  followed by the advancement of a second Kyphon trocar needle through the right pedicle into the posterior third of the vertebral body. Again, a bone biopsy was obtained at this location. Subsequently, the osteo drill was coaxially advanced to the anterior right third. The osteo drill was exchanged for a 20 mm Oseto cool RF ablation probe which was positioned under fluoroscopic guidance. With both Osteocool ablation probes in place, the L5 ablation was performed for 15 minutes. ------------------------------------------------------------------------ While the L5 ablation was being performed, attention was paid towards the L1 vertebral body. The left pedicle at L1 was then infiltrated with 1% lidocaine followed by the advancement of a Kyphon trocar needle through the left pedicle into the posterior one-third of the vertebral body. The trocar was removed  and a bone biopsy was obtained at this location. Subsequently, the osteo drill was advanced to the anterior third of the vertebral body. The osteo drill was retracted. Through the working cannula, a 15 mm Osetocool RF ablation probe was inserted and positioned under fluoroscopic guidance. In similar fashion, the right L1 pedicle was infiltrated with 1% lidocaine followed by the advancement of a second Kyphon trocar needle through the right pedicle into the posterior third of the vertebral body. Again, a bone biopsy was obtained at this location. Subsequently, the osteo drill was coaxially advanced to the anterior right third. The osteo drill was exchanged for a 15 mm Oseto cool RF ablation probe which was positioned under fluoroscopic guidance. With both Osteocool ablation probes in place, the L1 ablation was performed for 11.5 minutes. ------------------------------------------------------------------------ Attention was now paid towards the kyphoplasty portion of the procedure. Beginning at the L5 vertebral body level, Kyphon balloons measuring 15 mm in length were advanced through both working cannulas and positioned with the distal markers approximately 5 mm from the anterior aspect of the cortex at both the L1 and L5 vertebral body levels. Appropriate positioning was confirmed on the AP projection. At this time, both balloons were expanded using contrast via a Kyphon inflation syringe device via micro tubing. At this time, methylmethacrylate mixture was reconstituted in the Kyphon bone mixing device system. This was then loaded into the delivery mechanism, attached to Kyphon bone fillers. The balloons were deflated and removed followed by the instillation of methylmethacrylate mixture with excellent filling in the AP and lateral projections beginning at the L1 and subsequently at the L5 vertebral body. The working cannulae and the bone filler were then retrieved and removed. Multiple spot radiographic images  were obtained in various obliquities. Hemostasis was achieved with manual compression. The patient tolerated the procedure well without immediate postprocedural complication though not surprising was uncomfortable during the kyphoplasty portion of the procedure, especially at the L5 vertebral body. FINDINGS: Completion images demonstrate a technically excellent result with adequate cement filling of the L1 vertebral body both on the AP and lateral radiographs. No extravasation is noted in the disc space are posteriorly into the spinal canal. No epidural venous contamination seen. There is adequate filling of the L5 vertebral body, primarily at the location of the kyphoplasty balloons. A small amount cement is noted potentially external to the left lateral aspect of the L5 vertebral body. No extravasation is noted within the disc space or posteriorly into the spinal canal. IMPRESSION: 1. Technically successful L1 and L5 vertebral body ablation and cement augmentation using balloon kyphoplasty. 2. Technically successful L1 and L5 vertebral body biopsy. PLAN: - Continued in patient management as per the providing clinical team. - The patient will be  seen in follow-up consultation at the IR clinic in approximately 3-4 weeks. Electronically Signed   By: Sandi Mariscal M.D.   On: 09/11/2019 17:43   IR Bone Tumor(s)RF Ablation  Result Date: 09/11/2019 CLINICAL DATA:  History of metastatic breast cancer, now with symptomatic L1 and L5 malignant compression fractures. Patient presents today for fluoroscopic guided L1 and L5 vertebral body bone biopsy, RF ablation and kyphoplasty/cement augmentation. EXAM: 1. FLUOROSCOPIC GUIDED L1 VERTEBRAL BODY BIOPSY, RF ABLATION AND KYPHOPLASTY/CEMENT AUGMENTATION. 2. FLUOROSCOPIC GUIDED L5 VERTEBRAL BODY BIOPSY, RF ABLATION AND KYPHOPLASTY/CEMENT AUGMENTATION. COMPARISON:  CT the chest, abdomen pelvis-02/02/2019; lumbar spine MRI-09/08/2019 MEDICATIONS: Toradol 300 mg IV; Ancef 2 g IV;  The antibiotic was administered in an appropriate time interval prior to needle puncture of the skin. ANESTHESIA/SEDATION: Moderate (conscious) sedation was employed during this procedure. A total of Versed 6 mg and Fentanyl 300 mcg was administered intravenously. Moderate Sedation Time: 85 minutes. The patient's level of consciousness and vital signs were monitored continuously by radiology nursing throughout the procedure under my direct supervision. FLUOROSCOPY TIME:  17 minutes, 36 seconds (272 mGy) COMPLICATIONS: None immediate. TECHNIQUE: Informed written consent was obtained from the patient after a thorough discussion of the procedural risks, benefits and alternatives. All questions were addressed. Maximal Sterile Barrier Technique was utilized including caps, mask, sterile gowns, sterile gloves, sterile drape, hand hygiene and skin antiseptic. A timeout was performed prior to the initiation of the procedure. The patient was placed prone on the fluoroscopic table. The skin overlying the lumbar spine region was then prepped and draped in the usual sterile fashion. Maximal barrier sterile technique was utilized including caps, mask, sterile gowns, sterile gloves, sterile drape, hand hygiene and skin antiseptic. Intravenous Fentanyl and Versed were administered as conscious sedation during continuous cardiorespiratory monitoring by the radiology RN. The left pedicle at L5 was then infiltrated with 1% lidocaine followed by the advancement of a Kyphon trocar needle through the left pedicle into the posterior one-third of the vertebral body. The trocar was removed and a bone biopsy was obtained at this location. Subsequently, the osteo drill was advanced to the anterior third of the vertebral body. The osteo drill was retracted. Through the working cannula, a 20 mm Osetocool RF ablation probe was inserted and positioned under fluoroscopic guidance. In similar fashion, the right L5 pedicle was infiltrated with 1%  lidocaine followed by the advancement of a second Kyphon trocar needle through the right pedicle into the posterior third of the vertebral body. Again, a bone biopsy was obtained at this location. Subsequently, the osteo drill was coaxially advanced to the anterior right third. The osteo drill was exchanged for a 20 mm Oseto cool RF ablation probe which was positioned under fluoroscopic guidance. With both Osteocool ablation probes in place, the L5 ablation was performed for 15 minutes. ------------------------------------------------------------------------ While the L5 ablation was being performed, attention was paid towards the L1 vertebral body. The left pedicle at L1 was then infiltrated with 1% lidocaine followed by the advancement of a Kyphon trocar needle through the left pedicle into the posterior one-third of the vertebral body. The trocar was removed and a bone biopsy was obtained at this location. Subsequently, the osteo drill was advanced to the anterior third of the vertebral body. The osteo drill was retracted. Through the working cannula, a 15 mm Osetocool RF ablation probe was inserted and positioned under fluoroscopic guidance. In similar fashion, the right L1 pedicle was infiltrated with 1% lidocaine followed by the advancement of a second  Kyphon trocar needle through the right pedicle into the posterior third of the vertebral body. Again, a bone biopsy was obtained at this location. Subsequently, the osteo drill was coaxially advanced to the anterior right third. The osteo drill was exchanged for a 15 mm Oseto cool RF ablation probe which was positioned under fluoroscopic guidance. With both Osteocool ablation probes in place, the L1 ablation was performed for 11.5 minutes. ------------------------------------------------------------------------ Attention was now paid towards the kyphoplasty portion of the procedure. Beginning at the L5 vertebral body level, Kyphon balloons measuring 15 mm in  length were advanced through both working cannulas and positioned with the distal markers approximately 5 mm from the anterior aspect of the cortex at both the L1 and L5 vertebral body levels. Appropriate positioning was confirmed on the AP projection. At this time, both balloons were expanded using contrast via a Kyphon inflation syringe device via micro tubing. At this time, methylmethacrylate mixture was reconstituted in the Kyphon bone mixing device system. This was then loaded into the delivery mechanism, attached to Kyphon bone fillers. The balloons were deflated and removed followed by the instillation of methylmethacrylate mixture with excellent filling in the AP and lateral projections beginning at the L1 and subsequently at the L5 vertebral body. The working cannulae and the bone filler were then retrieved and removed. Multiple spot radiographic images were obtained in various obliquities. Hemostasis was achieved with manual compression. The patient tolerated the procedure well without immediate postprocedural complication though not surprising was uncomfortable during the kyphoplasty portion of the procedure, especially at the L5 vertebral body. FINDINGS: Completion images demonstrate a technically excellent result with adequate cement filling of the L1 vertebral body both on the AP and lateral radiographs. No extravasation is noted in the disc space are posteriorly into the spinal canal. No epidural venous contamination seen. There is adequate filling of the L5 vertebral body, primarily at the location of the kyphoplasty balloons. A small amount cement is noted potentially external to the left lateral aspect of the L5 vertebral body. No extravasation is noted within the disc space or posteriorly into the spinal canal. IMPRESSION: 1. Technically successful L1 and L5 vertebral body ablation and cement augmentation using balloon kyphoplasty. 2. Technically successful L1 and L5 vertebral body biopsy. PLAN: -  Continued in patient management as per the providing clinical team. - The patient will be seen in follow-up consultation at the IR clinic in approximately 3-4 weeks. Electronically Signed   By: Sandi Mariscal M.D.   On: 09/11/2019 17:43   IR KYPHO LUMBAR INC FX REDUCE BONE BX UNI/BIL CANNULATION INC/IMAGING  Result Date: 09/11/2019 CLINICAL DATA:  History of metastatic breast cancer, now with symptomatic L1 and L5 malignant compression fractures. Patient presents today for fluoroscopic guided L1 and L5 vertebral body bone biopsy, RF ablation and kyphoplasty/cement augmentation. EXAM: 1. FLUOROSCOPIC GUIDED L1 VERTEBRAL BODY BIOPSY, RF ABLATION AND KYPHOPLASTY/CEMENT AUGMENTATION. 2. FLUOROSCOPIC GUIDED L5 VERTEBRAL BODY BIOPSY, RF ABLATION AND KYPHOPLASTY/CEMENT AUGMENTATION. COMPARISON:  CT the chest, abdomen pelvis-02/02/2019; lumbar spine MRI-09/08/2019 MEDICATIONS: Toradol 300 mg IV; Ancef 2 g IV; The antibiotic was administered in an appropriate time interval prior to needle puncture of the skin. ANESTHESIA/SEDATION: Moderate (conscious) sedation was employed during this procedure. A total of Versed 6 mg and Fentanyl 300 mcg was administered intravenously. Moderate Sedation Time: 85 minutes. The patient's level of consciousness and vital signs were monitored continuously by radiology nursing throughout the procedure under my direct supervision. FLUOROSCOPY TIME:  17 minutes, 36 seconds (935 mGy)  COMPLICATIONS: None immediate. TECHNIQUE: Informed written consent was obtained from the patient after a thorough discussion of the procedural risks, benefits and alternatives. All questions were addressed. Maximal Sterile Barrier Technique was utilized including caps, mask, sterile gowns, sterile gloves, sterile drape, hand hygiene and skin antiseptic. A timeout was performed prior to the initiation of the procedure. The patient was placed prone on the fluoroscopic table. The skin overlying the lumbar spine region was  then prepped and draped in the usual sterile fashion. Maximal barrier sterile technique was utilized including caps, mask, sterile gowns, sterile gloves, sterile drape, hand hygiene and skin antiseptic. Intravenous Fentanyl and Versed were administered as conscious sedation during continuous cardiorespiratory monitoring by the radiology RN. The left pedicle at L5 was then infiltrated with 1% lidocaine followed by the advancement of a Kyphon trocar needle through the left pedicle into the posterior one-third of the vertebral body. The trocar was removed and a bone biopsy was obtained at this location. Subsequently, the osteo drill was advanced to the anterior third of the vertebral body. The osteo drill was retracted. Through the working cannula, a 20 mm Osetocool RF ablation probe was inserted and positioned under fluoroscopic guidance. In similar fashion, the right L5 pedicle was infiltrated with 1% lidocaine followed by the advancement of a second Kyphon trocar needle through the right pedicle into the posterior third of the vertebral body. Again, a bone biopsy was obtained at this location. Subsequently, the osteo drill was coaxially advanced to the anterior right third. The osteo drill was exchanged for a 20 mm Oseto cool RF ablation probe which was positioned under fluoroscopic guidance. With both Osteocool ablation probes in place, the L5 ablation was performed for 15 minutes. ------------------------------------------------------------------------ While the L5 ablation was being performed, attention was paid towards the L1 vertebral body. The left pedicle at L1 was then infiltrated with 1% lidocaine followed by the advancement of a Kyphon trocar needle through the left pedicle into the posterior one-third of the vertebral body. The trocar was removed and a bone biopsy was obtained at this location. Subsequently, the osteo drill was advanced to the anterior third of the vertebral body. The osteo drill was  retracted. Through the working cannula, a 15 mm Osetocool RF ablation probe was inserted and positioned under fluoroscopic guidance. In similar fashion, the right L1 pedicle was infiltrated with 1% lidocaine followed by the advancement of a second Kyphon trocar needle through the right pedicle into the posterior third of the vertebral body. Again, a bone biopsy was obtained at this location. Subsequently, the osteo drill was coaxially advanced to the anterior right third. The osteo drill was exchanged for a 15 mm Oseto cool RF ablation probe which was positioned under fluoroscopic guidance. With both Osteocool ablation probes in place, the L1 ablation was performed for 11.5 minutes. ------------------------------------------------------------------------ Attention was now paid towards the kyphoplasty portion of the procedure. Beginning at the L5 vertebral body level, Kyphon balloons measuring 15 mm in length were advanced through both working cannulas and positioned with the distal markers approximately 5 mm from the anterior aspect of the cortex at both the L1 and L5 vertebral body levels. Appropriate positioning was confirmed on the AP projection. At this time, both balloons were expanded using contrast via a Kyphon inflation syringe device via micro tubing. At this time, methylmethacrylate mixture was reconstituted in the Kyphon bone mixing device system. This was then loaded into the delivery mechanism, attached to Kyphon bone fillers. The balloons were deflated and removed followed  by the instillation of methylmethacrylate mixture with excellent filling in the AP and lateral projections beginning at the L1 and subsequently at the L5 vertebral body. The working cannulae and the bone filler were then retrieved and removed. Multiple spot radiographic images were obtained in various obliquities. Hemostasis was achieved with manual compression. The patient tolerated the procedure well without immediate  postprocedural complication though not surprising was uncomfortable during the kyphoplasty portion of the procedure, especially at the L5 vertebral body. FINDINGS: Completion images demonstrate a technically excellent result with adequate cement filling of the L1 vertebral body both on the AP and lateral radiographs. No extravasation is noted in the disc space are posteriorly into the spinal canal. No epidural venous contamination seen. There is adequate filling of the L5 vertebral body, primarily at the location of the kyphoplasty balloons. A small amount cement is noted potentially external to the left lateral aspect of the L5 vertebral body. No extravasation is noted within the disc space or posteriorly into the spinal canal. IMPRESSION: 1. Technically successful L1 and L5 vertebral body ablation and cement augmentation using balloon kyphoplasty. 2. Technically successful L1 and L5 vertebral body biopsy. PLAN: - Continued in patient management as per the providing clinical team. - The patient will be seen in follow-up consultation at the IR clinic in approximately 3-4 weeks. Electronically Signed   By: Sandi Mariscal M.D.   On: 09/11/2019 17:43   IR KYPHO EA ADDL LEVEL THORACIC OR LUMBAR  Result Date: 09/11/2019 CLINICAL DATA:  History of metastatic breast cancer, now with symptomatic L1 and L5 malignant compression fractures. Patient presents today for fluoroscopic guided L1 and L5 vertebral body bone biopsy, RF ablation and kyphoplasty/cement augmentation. EXAM: 1. FLUOROSCOPIC GUIDED L1 VERTEBRAL BODY BIOPSY, RF ABLATION AND KYPHOPLASTY/CEMENT AUGMENTATION. 2. FLUOROSCOPIC GUIDED L5 VERTEBRAL BODY BIOPSY, RF ABLATION AND KYPHOPLASTY/CEMENT AUGMENTATION. COMPARISON:  CT the chest, abdomen pelvis-02/02/2019; lumbar spine MRI-09/08/2019 MEDICATIONS: Toradol 300 mg IV; Ancef 2 g IV; The antibiotic was administered in an appropriate time interval prior to needle puncture of the skin. ANESTHESIA/SEDATION: Moderate  (conscious) sedation was employed during this procedure. A total of Versed 6 mg and Fentanyl 300 mcg was administered intravenously. Moderate Sedation Time: 85 minutes. The patient's level of consciousness and vital signs were monitored continuously by radiology nursing throughout the procedure under my direct supervision. FLUOROSCOPY TIME:  17 minutes, 36 seconds (245 mGy) COMPLICATIONS: None immediate. TECHNIQUE: Informed written consent was obtained from the patient after a thorough discussion of the procedural risks, benefits and alternatives. All questions were addressed. Maximal Sterile Barrier Technique was utilized including caps, mask, sterile gowns, sterile gloves, sterile drape, hand hygiene and skin antiseptic. A timeout was performed prior to the initiation of the procedure. The patient was placed prone on the fluoroscopic table. The skin overlying the lumbar spine region was then prepped and draped in the usual sterile fashion. Maximal barrier sterile technique was utilized including caps, mask, sterile gowns, sterile gloves, sterile drape, hand hygiene and skin antiseptic. Intravenous Fentanyl and Versed were administered as conscious sedation during continuous cardiorespiratory monitoring by the radiology RN. The left pedicle at L5 was then infiltrated with 1% lidocaine followed by the advancement of a Kyphon trocar needle through the left pedicle into the posterior one-third of the vertebral body. The trocar was removed and a bone biopsy was obtained at this location. Subsequently, the osteo drill was advanced to the anterior third of the vertebral body. The osteo drill was retracted. Through the working cannula, a 20  mm Osetocool RF ablation probe was inserted and positioned under fluoroscopic guidance. In similar fashion, the right L5 pedicle was infiltrated with 1% lidocaine followed by the advancement of a second Kyphon trocar needle through the right pedicle into the posterior third of the  vertebral body. Again, a bone biopsy was obtained at this location. Subsequently, the osteo drill was coaxially advanced to the anterior right third. The osteo drill was exchanged for a 20 mm Oseto cool RF ablation probe which was positioned under fluoroscopic guidance. With both Osteocool ablation probes in place, the L5 ablation was performed for 15 minutes. ------------------------------------------------------------------------ While the L5 ablation was being performed, attention was paid towards the L1 vertebral body. The left pedicle at L1 was then infiltrated with 1% lidocaine followed by the advancement of a Kyphon trocar needle through the left pedicle into the posterior one-third of the vertebral body. The trocar was removed and a bone biopsy was obtained at this location. Subsequently, the osteo drill was advanced to the anterior third of the vertebral body. The osteo drill was retracted. Through the working cannula, a 15 mm Osetocool RF ablation probe was inserted and positioned under fluoroscopic guidance. In similar fashion, the right L1 pedicle was infiltrated with 1% lidocaine followed by the advancement of a second Kyphon trocar needle through the right pedicle into the posterior third of the vertebral body. Again, a bone biopsy was obtained at this location. Subsequently, the osteo drill was coaxially advanced to the anterior right third. The osteo drill was exchanged for a 15 mm Oseto cool RF ablation probe which was positioned under fluoroscopic guidance. With both Osteocool ablation probes in place, the L1 ablation was performed for 11.5 minutes. ------------------------------------------------------------------------ Attention was now paid towards the kyphoplasty portion of the procedure. Beginning at the L5 vertebral body level, Kyphon balloons measuring 15 mm in length were advanced through both working cannulas and positioned with the distal markers approximately 5 mm from the anterior  aspect of the cortex at both the L1 and L5 vertebral body levels. Appropriate positioning was confirmed on the AP projection. At this time, both balloons were expanded using contrast via a Kyphon inflation syringe device via micro tubing. At this time, methylmethacrylate mixture was reconstituted in the Kyphon bone mixing device system. This was then loaded into the delivery mechanism, attached to Kyphon bone fillers. The balloons were deflated and removed followed by the instillation of methylmethacrylate mixture with excellent filling in the AP and lateral projections beginning at the L1 and subsequently at the L5 vertebral body. The working cannulae and the bone filler were then retrieved and removed. Multiple spot radiographic images were obtained in various obliquities. Hemostasis was achieved with manual compression. The patient tolerated the procedure well without immediate postprocedural complication though not surprising was uncomfortable during the kyphoplasty portion of the procedure, especially at the L5 vertebral body. FINDINGS: Completion images demonstrate a technically excellent result with adequate cement filling of the L1 vertebral body both on the AP and lateral radiographs. No extravasation is noted in the disc space are posteriorly into the spinal canal. No epidural venous contamination seen. There is adequate filling of the L5 vertebral body, primarily at the location of the kyphoplasty balloons. A small amount cement is noted potentially external to the left lateral aspect of the L5 vertebral body. No extravasation is noted within the disc space or posteriorly into the spinal canal. IMPRESSION: 1. Technically successful L1 and L5 vertebral body ablation and cement augmentation using balloon kyphoplasty. 2. Technically  successful L1 and L5 vertebral body biopsy. PLAN: - Continued in patient management as per the providing clinical team. - The patient will be seen in follow-up consultation at  the IR clinic in approximately 3-4 weeks. Electronically Signed   By: Sandi Mariscal M.D.   On: 09/11/2019 17:43   [place] PLACEMENT LEFT >5 YRS INC IMG GUIDE  Result Date: 09/11/2019 INDICATION: Poor venous access. In need of durable intravenous access for medication and blood draws while hospitalized. EXAM: ULTRASOUND AND FLUOROSCOPIC GUIDED PICC LINE INSERTION MEDICATIONS: None. CONTRAST:  None FLUOROSCOPY TIME:  1 minute, 42 seconds (3 mGy) COMPLICATIONS: None immediate. TECHNIQUE: The procedure, risks, benefits, and alternatives were explained to the patient and informed written consent was obtained. A timeout was performed prior to the initiation of the procedure. The left upper extremity was prepped with chlorhexidine in a sterile fashion, and a sterile drape was applied covering the operative field. Maximum barrier sterile technique with sterile gowns and gloves were used for the procedure. A timeout was performed prior to the initiation of the procedure. Local anesthesia was provided with 1% lidocaine. Under direct ultrasound guidance, the brachial vein was accessed with a micropuncture kit after the overlying soft tissues were anesthetized with 1% lidocaine. After the overlying soft tissues were anesthetized, a small venotomy incision was created and a micropuncture kit was utilized to access the left brachial vein. Real-time ultrasound guidance was utilized for vascular access including the acquisition of a permanent ultrasound image documenting patency of the accessed vessel. A guidewire was advanced to the level of the superior caval-atrial junction for measurement purposes and the PICC line was cut to length. A peel-away sheath was placed and a 39 cm, 5 [place], single lumen was inserted to level of the superior caval-atrial junction. A post procedure spot fluoroscopic was obtained. The catheter easily aspirated and flushed and was secured in place with stat lock device. A dressing was applied. The  patient tolerated the procedure well without immediate post procedural complication. FINDINGS: After catheter placement, the tip lies within the superior cavoatrial junction. The catheter aspirates and flushes normally and is ready for immediate use. IMPRESSION: Successful ultrasound and fluoroscopic guided placement of a left brachial vein approach, 39 cm, 5 [place], single lumen PICC with tip at the superior caval-atrial junction. The PICC line is ready for immediate use. Electronically Signed   By: Sandi Mariscal M.D.   On: 09/11/2019 17:45        Scheduled Meds: . acetaminophen  650 mg Oral Q6H  . carvedilol  12.5 mg Oral BID WC  . Chlorhexidine Gluconate Cloth  6 each Topical Daily  . cholecalciferol  1,000 Units Oral Daily  . cyclobenzaprine  5 mg Oral TID  . diclofenac Sodium  2 g Topical QID  . docusate sodium  100 mg Oral BID  . dronabinol  2.5 mg Oral QAC supper  . [START ON 09/13/2019] enoxaparin (LOVENOX) injection  40 mg Subcutaneous Q24H  . exemestane  25 mg Oral QPC breakfast  . feeding supplement (ENSURE ENLIVE)  237 mL Oral TID BM  . loratadine  10 mg Oral Daily  . [START ON 09/13/2019] losartan  25 mg Oral Daily  . multivitamin with minerals  1 tablet Oral Daily  . oxyCODONE  15 mg Oral Q12H  . pantoprazole  40 mg Oral Daily  . potassium chloride  40 mEq Oral Once  . senna-docusate  2 tablet Oral BID   Continuous Infusions:    LOS: 9 days  Time spent: 31mns I have personally reviewed and interpreted on  09/12/2019 daily labs,  imagings as discussed above under date review session and assessment and plans.  I reviewed all nursing notes, pharmacy notes, consultant notes,  vitals, pertinent old records  I have discussed plan of care as described above with  patient  on 09/12/2019  Voice Recognition /Dragon dictation system was used to create this note, attempts have been made to correct errors. Please contact the author with questions and/or  clarifications.   FFlorencia Reasons MD PhD FACP [facility]  Available via Epic secure chat 7am-7pm for nonurgent issues Please page for urgent issues To page the attending provider between 7A-7P or the covering provider during after hours 7P-7A, please log into the web site [email or web address] and access using universal [place] password for that web site. If you do not have the password, please call the hospital operator.    09/12/2019, 12:55 PM

## 2019-09-12 NOTE — TOC Progression Note (Signed)
Transition of Care [facility]) - Progression Note    Patient Details  Name: Erica Young MRN: [id number] Date of Birth: [birth year]  Transition of [facility]) CM/SW [place], [place] Phone Number: 09/12/2019, 2:44 PM  Clinical Narrative:    CSW confirm with [facility], they will accept the patient on 5/16. Pending a negative covid test. CSW reached out to son per patient request to confirm SNF choice is [facility], and for him to anticiapte the patient discharge tomorrow. He was appreciative of "heads up."  [facility] staff will assist with discharge tomorrow. Facility will need the d/c summary before 12:00pm.    Expected Discharge Plan: [place] Barriers to Discharge: Continued Medical Work up, Other (comment)(Covid test)  Expected Discharge Plan and Services Expected Discharge Plan: [place] arrangements for the past 2 months: Single Family Home                                       Social Determinants of Health (SDOH) Interventions    Readmission Risk Interventions No flowsheet data found.

## 2019-09-12 NOTE — Plan of Care (Signed)
  Problem: Clinical Measurements: Goal: Will remain free from infection Outcome: Progressing   Problem: Activity: Goal: Risk for activity intolerance will decrease Outcome: Not Progressing   Problem: Nutrition: Goal: Adequate nutrition will be maintained Outcome: Progressing   Problem: Coping: Goal: Level of anxiety will decrease Outcome: Not Progressing   Problem: Elimination: Goal: Will not experience complications related to bowel motility Outcome: Not Progressing   Problem: Pain Managment: Goal: General experience of comfort will improve Outcome: Not Progressing

## 2019-09-12 NOTE — Progress Notes (Signed)
Patient upset that PA removed the dressing on her back and left it hanging.  I reassured her that I could fix it, and showed her the tiny bit of drainage on the old gauze.  Redressed the top two wounds with gauze and tape.  No drainage noted at the sites at all.

## 2019-09-13 DIAGNOSIS — R2689 Other abnormalities of gait and mobility: Secondary | ICD-10-CM | POA: Diagnosis not present

## 2019-09-13 DIAGNOSIS — I69928 Other speech and language deficits following unspecified cerebrovascular disease: Secondary | ICD-10-CM | POA: Diagnosis not present

## 2019-09-13 DIAGNOSIS — Z7401 Bed confinement status: Secondary | ICD-10-CM | POA: Diagnosis not present

## 2019-09-13 DIAGNOSIS — M4846XA Fatigue fracture of vertebra, lumbar region, initial encounter for fracture: Secondary | ICD-10-CM | POA: Diagnosis not present

## 2019-09-13 DIAGNOSIS — E440 Moderate protein-calorie malnutrition: Secondary | ICD-10-CM | POA: Diagnosis not present

## 2019-09-13 DIAGNOSIS — M4846XS Fatigue fracture of vertebra, lumbar region, sequela of fracture: Secondary | ICD-10-CM | POA: Diagnosis not present

## 2019-09-13 DIAGNOSIS — C50919 Malignant neoplasm of unspecified site of unspecified female breast: Secondary | ICD-10-CM | POA: Diagnosis not present

## 2019-09-13 DIAGNOSIS — C787 Secondary malignant neoplasm of liver and intrahepatic bile duct: Secondary | ICD-10-CM | POA: Diagnosis not present

## 2019-09-13 DIAGNOSIS — C50411 Malignant neoplasm of upper-outer quadrant of right female breast: Secondary | ICD-10-CM | POA: Diagnosis not present

## 2019-09-13 DIAGNOSIS — S32010S Wedge compression fracture of first lumbar vertebra, sequela: Secondary | ICD-10-CM | POA: Diagnosis not present

## 2019-09-13 DIAGNOSIS — M2550 Pain in unspecified joint: Secondary | ICD-10-CM | POA: Diagnosis not present

## 2019-09-13 DIAGNOSIS — R41841 Cognitive communication deficit: Secondary | ICD-10-CM | POA: Diagnosis not present

## 2019-09-13 DIAGNOSIS — C7951 Secondary malignant neoplasm of bone: Secondary | ICD-10-CM | POA: Diagnosis not present

## 2019-09-13 DIAGNOSIS — S32010A Wedge compression fracture of first lumbar vertebra, initial encounter for closed fracture: Secondary | ICD-10-CM | POA: Diagnosis not present

## 2019-09-13 DIAGNOSIS — M6281 Muscle weakness (generalized): Secondary | ICD-10-CM | POA: Diagnosis not present

## 2019-09-13 DIAGNOSIS — R5381 Other malaise: Secondary | ICD-10-CM | POA: Diagnosis not present

## 2019-09-13 DIAGNOSIS — R2681 Unsteadiness on feet: Secondary | ICD-10-CM | POA: Diagnosis not present

## 2019-09-13 LAB — BASIC METABOLIC PANEL
Anion gap: 8 (ref 5–15)
BUN: 11 mg/dL (ref 8–23)
CO2: 28 mmol/L (ref 22–32)
Calcium: 8.4 mg/dL — ABNORMAL LOW (ref 8.9–10.3)
Chloride: 98 mmol/L (ref 98–111)
Creatinine, Ser: 0.46 mg/dL (ref 0.44–1.00)
GFR calc Af Amer: >60 mL/min (ref >60)
GFR calc non Af Amer: >60 mL/min (ref >60)
Glucose, Bld: 105 mg/dL — ABNORMAL HIGH (ref 70–99)
Potassium: 3.5 mmol/L (ref 3.5–5.1)
Sodium: 134 mmol/L — ABNORMAL LOW (ref 135–145)

## 2019-09-13 LAB — SARS CORONAVIRUS 2 (TAT 6-24 HRS): SARS Coronavirus 2: NEGATIVE

## 2019-09-13 LAB — MAGNESIUM: Magnesium: 1.9 mg/dL (ref 1.7–2.4)

## 2019-09-13 MED ORDER — ENSURE ENLIVE PO LIQD: 237.0000 mL | Freq: Three times a day (TID) | ORAL | 12 refills | Status: AC

## 2019-09-13 MED ORDER — HYDROCODONE-ACETAMINOPHEN 5-325 MG PO TABS: 1.0000 | ORAL_TABLET | Freq: Four times a day (QID) | ORAL | 0 refills | Status: DC | PRN

## 2019-09-13 MED ORDER — GABAPENTIN 100 MG PO CAPS: 100.0000 mg | ORAL_CAPSULE | Freq: Three times a day (TID) | ORAL | 0 refills | Status: DC

## 2019-09-13 MED ORDER — LOSARTAN POTASSIUM 25 MG PO TABS: 25.0000 mg | ORAL_TABLET | Freq: Every day | ORAL | Status: AC

## 2019-09-13 MED ORDER — OXYCODONE HCL ER 15 MG PO T12A: 15.0000 mg | EXTENDED_RELEASE_TABLET | Freq: Two times a day (BID) | ORAL | 0 refills | Status: DC

## 2019-09-13 MED ORDER — SENNOSIDES-DOCUSATE SODIUM 8.6-50 MG PO TABS: 2.0000 | ORAL_TABLET | Freq: Two times a day (BID) | ORAL | 0 refills | Status: AC

## 2019-09-13 MED ORDER — ADULT MULTIVITAMIN W/MINERALS CH: 1.0000 | ORAL_TABLET | Freq: Every day | ORAL | Status: AC

## 2019-09-13 MED ORDER — PANTOPRAZOLE SODIUM 40 MG PO TBEC: 40.0000 mg | DELAYED_RELEASE_TABLET | Freq: Every day | ORAL | Status: AC

## 2019-09-13 MED ORDER — IBUPROFEN 400 MG PO TABS: 400.0000 mg | ORAL_TABLET | Freq: Four times a day (QID) | ORAL | 0 refills | Status: AC | PRN

## 2019-09-13 MED ORDER — POLYETHYLENE GLYCOL 3350 17 G PO PACK: 17.0000 g | PACK | Freq: Two times a day (BID) | ORAL | 0 refills | Status: AC

## 2019-09-13 MED ORDER — DRONABINOL 2.5 MG PO CAPS: 2.5000 mg | ORAL_CAPSULE | Freq: Every day | ORAL | Status: AC

## 2019-09-13 MED ORDER — ALPRAZOLAM 0.25 MG PO TABS: 0.2500 mg | ORAL_TABLET | Freq: Three times a day (TID) | ORAL | 0 refills | Status: DC | PRN

## 2019-09-13 MED ORDER — DICLOFENAC SODIUM 1 % EX GEL: 2.0000 g | Freq: Four times a day (QID) | CUTANEOUS | 0 refills | Status: AC

## 2019-09-13 MED ORDER — CYCLOBENZAPRINE HCL 5 MG PO TABS: 5.0000 mg | ORAL_TABLET | Freq: Three times a day (TID) | ORAL | 0 refills | Status: AC

## 2019-09-13 MED ORDER — POLYETHYLENE GLYCOL 3350 17 G PO PACK
17.0000 g | PACK | Freq: Two times a day (BID) | ORAL | Status: DC
  Filled 2019-09-13: qty 1

## 2019-09-13 NOTE — Progress Notes (Signed)
[facility] and gave report to Simmie Davies, nurse at [facility].  Reviewed history and allergies.  Questions asked and answered.  Expecting PTAR around 3 pm for transport out.

## 2019-09-13 NOTE — TOC Transition Note (Signed)
Transition of Care [facility]) - CM/SW Discharge Note   Patient Details  Name: Carsynn Bethune MRN: [id number] Date of Birth: November 14, 1940  Transition of Care [facility]) CM/SW Contact:  Servando Snare, LCSW Phone Number: 09/13/2019, 11:38 AM   Clinical Narrative:   Patient to transfer to [facility]. Patient can transfer around 3pm. Patient to report to room 105P. RN report number: [phone]    Final next level of care: Skilled Nursing Facility Barriers to Discharge: No Barriers Identified   Patient Goals and CMS Choice Patient states their goals for this hospitalization and ongoing recovery are:: Pt is aware of SNF recommendation - undecided   Choice offered to / list presented to : NA  Discharge Placement              Patient chooses bed at: [facility] Patient to be transferred to facility by: EMS Name of family member notified: Patient to notify family    Discharge Plan and Services                DME Arranged: N/A DME Agency: NA       HH Arranged: NA HH Agency: NA        Social Determinants of Health (SDOH) Interventions     Readmission Risk Interventions No flowsheet data found.

## 2019-09-13 NOTE — Discharge Summary (Signed)
Discharge Summary  Erica Young OZH:[id number] DOB: 1941-01-28  PCP: Erica Young, Erica Halsted, MD  Admit date: 09/02/2019 Discharge date: 09/13/2019  Time spent: 43mns, more than 50% time spent on coordination of care.   Recommendations for Outpatient Follow-up:  1. F/u with SNF MD  for hospital discharge follow up, repeat cbc/bmp at follow up 2. F/u with oncology Dr Erica Young 3. F/u with radiation oncology Dr Erica Come4. F/u with interventional radiology Dr Erica Young  Discharge Diagnoses:  Active Hospital Problems   Diagnosis Date Noted  . Vertebral compression fracture ([facility] 09/03/2019  . Malnutrition of moderate degree 09/05/2019  . Hypoxia 09/03/2019  . Compression fracture of L1 lumbar vertebra ([facility] 09/03/2019  . Bone metastases ([facility] 11/22/2014  . Metastatic breast cancer ([facility] 01/23/2014  . HTN (hypertension) 06/09/2010    Resolved Hospital Problems  No resolved problems to display.    Discharge Condition: stable  Diet recommendation: heart healthy  Filed Weights   09/02/19 2326 09/10/19 2246  Weight: 52.2 kg 51.1 kg    History of present illness: (per admitting MD Dr RMarlowe Young PCP: HIsaac Young ERayford Halsted MD Patient coming from: Home  Chief Complaint: Low back pain  HPI: MLesta Limbertis a 79y.o. 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  Patient states she has chronic lower back pain which radiates to her right lower extremity and is associated with numbness and tingling in this leg.  States she has a tumor in her back for which he is currently receiving radiation by Dr. KSondra Young  For the past few weeks the pain has been excruciating.  It has been very difficult for her to ambulate even with her walker.  Denies any recent falls.  Denies saddle anesthesia or bowel/bladder dysfunction.  She currently  lives by herself as her husband recently passed away.  Denies fevers or chills.  Denies cough, shortness of breath, or chest pain.  States she has already received her Covid vaccines.  ED Course: Afebrile.  No leukocytosis on labs.  Potassium 3.4.  UA not suggestive of infection.  SARS-CoV-2 PCR test negative. Chest x-ray showing right hilar adenopathy and multiple known pulmonary nodules.  No acute cardiopulmonary abnormality.  Chronic elevation of the right hemidiaphragm. X-ray of lumbar spine showing a pathologic L1 compression fracture with 50% height loss, new since abdominal CT done 07/14/2019.  L5 metastatic disease. Patient was given IV Decadron 10 mg, fentanyl, and Toradol.  Hospital Course:  Principal Problem:   Vertebral compression fracture (HCC) Active Problems:   HTN (hypertension)   Metastatic breast cancer (HCC)   Bone metastases (HCC)   Hypoxia   Compression fracture of L1 lumbar vertebra (HCC)   Malnutrition of moderate degree   Acute pathologic fracture at L1 and L5 -S/p RF ablation (Ostecool) andcement augmentation of theL1 and L5vertebral bodies on 5/14 -She has been fitted for a TLSO brace -Pain control, PT eval -f/u with oncology/radiation oncology and IR  Intravascular dehydration with hypokalemiawith constipationanddyspepsia Aggressive bowel regimen, Encourage oral intake She is having BM, potassium is normal   Bilateral atelectasis with transient hypoxemia:Encourage use of incentive spirometer She is on room air at rest  Essential hypertension:Coreg and losartan   Stage IV metastatic breast cancer/liver and bone:Status post radiation, continue exemestane. Follows with Dr. MJana Young Non-severe (moderate) malnutrition in context of chronic illness With poor appetite, Only consume 10-25%  of the meal, Nutrition input appreciated, start Marinol for appetite stimulant  Body mass index is 21.29 kg/m.    Code Status: DNR Family  Communication: patient, she  Declined my offer to call her family Disposition:    Dispo: The patient is from: home  Anticipated d/c is to:snf       Consultants:   IR   oncology  Procedures:  RF ablation (Ostecool) andcement augmentation of theL1 and L5vertebral bodies.on 5/14 picc line placement by IR on 5/14  Antimicrobials:   Ancefx1 for IR procedure on May 14   Discharge Exam: BP (!) 152/88 (BP Location: Left Arm)   Pulse 97   Temp 98 F (36.7 C)   Resp 16   Ht _0  (1.549 m)   Wt 51.1 kg   SpO2 95%   BMI 21.29 kg/m   General: frail and weak, anxious Cardiovascular: RRR Respiratory: diminished, no wheezing, no rhonchi ,no rales   Discharge Instructions You were cared for by a hospitalist during your hospital stay. If you have any questions about your discharge medications or the care you received while you were in the hospital after you are discharged, you can call the unit and asked to speak with the hospitalist on call if the hospitalist that took care of you is not available. Once you are discharged, your primary care physician will handle any further medical issues. Please note that NO REFILLS for any discharge medications will be authorized once you are discharged, as it is imperative that you return to your primary care physician (or establish a relationship with a primary care physician if you do not have one) for your aftercare needs so that they can reassess your need for medications and monitor your lab values.  Discharge Instructions    Diet - low sodium heart healthy   Complete by: As directed    Increase activity slowly   Complete by: As directed      Allergies as of 09/13/2019      Reactions   Ciprofloxacin Anaphylaxis      Medication List    STOP taking these medications   docusate sodium 100 MG capsule Commonly known as: COLACE   meclizine 25 MG tablet Commonly known as: ANTIVERT     TAKE  these medications   ALPRAZolam 0.25 MG tablet Commonly known as: XANAX Take 1 tablet (0.25 mg total) by mouth 3 (three) times daily as needed. for anxiety   aluminum-magnesium hydroxide 200-200 MG/5ML suspension Take by mouth every 6 (six) hours as needed for indigestion.   aspirin EC 81 MG tablet Take 81 mg by mouth daily after breakfast.   carvedilol 12.5 MG tablet Commonly known as: COREG Take 1 tablet (12.5 mg total) by mouth 2 (two) times daily with a meal.   cyclobenzaprine 5 MG tablet Commonly known as: FLEXERIL Take 1 tablet (5 mg total) by mouth 3 (three) times daily.   diclofenac Sodium 1 % Gel Commonly known as: VOLTAREN Apply 2 g topically 4 (four) times daily. To affected area   dronabinol 2.5 MG capsule Commonly known as: MARINOL Take 1 capsule (2.5 mg total) by mouth daily before supper.   exemestane 25 MG tablet Commonly known as: AROMASIN Take 1 tablet (25 mg total) by mouth daily after breakfast.   feeding supplement (ENSURE ENLIVE) Liqd Take 237 mLs by mouth 3 (three) times daily between meals.   gabapentin 100 MG capsule Commonly known as: Neurontin Take 1 capsule (100 mg total) by mouth 3 (three)  times daily.   Glucosamine 500 MG Caps Take 500 mg by mouth in the morning and at bedtime.   HYDROcodone-acetaminophen 5-325 MG tablet Commonly known as: NORCO/VICODIN Take 1 tablet by mouth every 6 (six) hours as needed. What changed: reasons to take this   ibuprofen 400 MG tablet Commonly known as: ADVIL Take 1 tablet (400 mg total) by mouth every 6 (six) hours as needed for moderate pain.   loratadine 10 MG tablet Commonly known as: CLARITIN Take 1 tablet (10 mg total) by mouth daily.   losartan 25 MG tablet Commonly known as: COZAAR Take 1 tablet (25 mg total) by mouth daily. Start taking on: Sep 14, 2019 What changed: how much to take   multivitamin with minerals Tabs tablet Take 1 tablet by mouth daily. Start taking on: Sep 14, 2019     ondansetron 4 MG disintegrating tablet Commonly known as: ZOFRAN-ODT Take 1 tablet (4 mg total) by mouth every 8 (eight) hours as needed for nausea or vomiting.   oxyCODONE 15 mg 12 hr tablet Commonly known as: OXYCONTIN Take 1 tablet (15 mg total) by mouth every 12 (twelve) hours.   pantoprazole 40 MG tablet Commonly known as: PROTONIX Take 1 tablet (40 mg total) by mouth daily. Start taking on: Sep 14, 2019   polyethylene glycol 17 g packet Commonly known as: MIRALAX / GLYCOLAX Take 17 g by mouth 2 (two) times daily.   senna-docusate 8.6-50 MG tablet Commonly known as: Senokot-S Take 2 tablets by mouth 2 (two) times daily. Hold if diarrhea   VITAMIN D PO Take 1,000 Units by mouth daily.      Allergies  Allergen Reactions  . Ciprofloxacin Anaphylaxis    Contact information for follow-up providers    Sandi Mariscal, MD Follow up.   Specialties: Interventional Radiology, Radiology Why: Schedulers will call with date and time of follow-up appointment.  Contact information: [place] [address] [phone]        Magrinat, Virgie Dad, MD Follow up.   Specialty: Oncology Contact information: [place] [address] [phone]            Contact information for after-discharge care    Destination    [facility] .   Service: Skilled Nursing Contact information: [place] [phone]                   The results of significant diagnostics from this hospitalization (including imaging, microbiology, ancillary and laboratory) are listed below for reference.    Significant Diagnostic Studies: DG Chest 2 View  Result Date: 09/03/2019 CLINICAL DATA:  Low back pain and shortness of breath increasing EXAM: CHEST - 2 VIEW COMPARISON:  CT 02/02/2019, radiograph 01/07/2014 FINDINGS: Right hilar adenopathy is similar to comparison CT. Scattered  pulmonary nodules seen on cross-sectional imaging are not well visualized on this exam. There is chronic elevation of the right hemidiaphragm. No consolidation, features of edema, pneumothorax, or effusion. The aorta is calcified. The remaining cardiomediastinal contours are unremarkable. postsurgical changes are noted along the right chest wall. Cholecystectomy clips in the right upper quadrant. No acute osseous or soft tissue abnormality. Degenerative changes are present in the imaged spine and shoulders. IMPRESSION: Right hilar adenopathy, seen on prior cross-sectional imaging. Multiple known pulmonary nodules are poorly visualized on this exam. No acute cardiopulmonary abnormality. Chronic elevation of the right hemidiaphragm. Aortic Atherosclerosis (ICD10-I70.0). Electronically Signed   By: March Rummage  [facility] M.D.   On: 09/03/2019 04:03   DG Lumbar Spine Complete  Result Date: 09/03/2019 CLINICAL DATA:  Insert patient with low back pain EXAM: LUMBAR SPINE - COMPLETE 4+ VIEW COMPARISON:  07/14/2019 FINDINGS: Since comparison study the sclerotic L1 body has compressed by 50%. Height loss is right eccentric, where the tumor bulk is greatest by CT. There was extraosseous tumor at this level by CT, with indistinct posterior cortex today. L5 sclerosis which is also metastatic. Mild scoliosis. IMPRESSION: 1. Pathologic L1 compression fracture with 50% height loss, new since abdominal CT 07/14/2019 2. L5 metastatic disease. Electronically Signed   By: Monte Fantasia M.D.   On: 09/03/2019 04:05   MR LUMBAR SPINE WO CONTRAST  Result Date: 09/08/2019 CLINICAL DATA:  Low back pain. EXAM: MRI LUMBAR SPINE WITHOUT CONTRAST TECHNIQUE: Multiplanar, multisequence MR imaging of the lumbar spine was performed. No intravenous contrast was administered. COMPARISON:  CT of the abdomen July 14, 2019. Lumbar spine radiographs Sep 03, 2019. FINDINGS: Segmentation:  Standard. Alignment:  Physiologic. Vertebrae: Diffuse T1 hypointense  signal of the L1 and L5 vertebral body extending into the corresponding pedicles consistent with secondary involvement. Associated pathologic fractures with approximately 60% loss of vertebral body height at L1 and 35% at L5. Retropulsion of the posterior wall into the spinal canal with effacement of the right lateral recess and mild to moderate narrowing of the spinal canal at L1 and effacement of the bilateral lateral recesses at L5. Conus medullaris and cauda equina: Conus extends to the T12-L1 level. Conus and cauda equina appear normal. Paraspinal and other soft tissues: Multiple right renal cysts. Colonic diverticula. Disc levels: T12-L1: Narrowing of the right subarticular zone at the disc level related to L1 expansile lesion. No neural foraminal stenosis. At the level of the L1 vertebral body, there is effacement of the right lateral recess and mild to moderate spinal canal stenosis related to retropulsion of the L1 posterior wall. L1-2: No spinal canal stenosis. Moderate to severe narrowing of the right neural foramen related to expansile L1 lesion. L2-3: Right asymmetric disc bulge and mild facet degenerative changes without significant spinal canal or neural foraminal stenosis. L3-4: Disc bulge and mild facet degenerative changes. No significant spinal canal or neural foraminal stenosis. L4-5: No neural foraminal stenosis. No spinal canal stenosis at the level of the disc space. Effacement of the bilateral lateral recesses and mild spinal canal stenosis at the level of the L5 vertebral body. L5-S1: No spinal canal or neural foraminal stenosis. IMPRESSION: 1. Pathologic fractures at L1 and L5 with approximately 60% and 35% loss of vertebral body height, respectively. 2. Retropulsion of the posterior wall into the spinal canal resulting in mild to moderate spinal canal stenosis at L1 and mild spinal canal stenosis at L5. 3. Moderate to severe right L1-L2 neural foraminal stenosis related to extensor lesion  encroachment. Electronically Signed   By: Pedro Earls M.D.   On: 09/08/2019 09:35   IR Bone Tumor(s)RF Ablation  Result Date: 09/11/2019 CLINICAL DATA:  History of metastatic breast cancer, now with symptomatic L1 and L5 malignant compression fractures. Patient presents today for fluoroscopic guided L1 and L5 vertebral body bone biopsy, RF ablation and kyphoplasty/cement augmentation. EXAM: 1. FLUOROSCOPIC GUIDED L1 VERTEBRAL BODY BIOPSY, RF ABLATION AND KYPHOPLASTY/CEMENT AUGMENTATION. 2. FLUOROSCOPIC GUIDED L5 VERTEBRAL BODY BIOPSY, RF ABLATION AND KYPHOPLASTY/CEMENT AUGMENTATION. COMPARISON:  CT the chest, abdomen pelvis-02/02/2019; lumbar spine MRI-09/08/2019 MEDICATIONS: Toradol 300 mg IV; Ancef 2 g IV; The antibiotic was administered in  an appropriate time interval prior to needle puncture of the skin. ANESTHESIA/SEDATION: Moderate (conscious) sedation was employed during this procedure. A total of Versed 6 mg and Fentanyl 300 mcg was administered intravenously. Moderate Sedation Time: 85 minutes. The patient's level of consciousness and vital signs were monitored continuously by radiology nursing throughout the procedure under my direct supervision. FLUOROSCOPY TIME:  17 minutes, 36 seconds (557 mGy) COMPLICATIONS: None immediate. TECHNIQUE: Informed written consent was obtained from the patient after a thorough discussion of the procedural risks, benefits and alternatives. All questions were addressed. Maximal Sterile Barrier Technique was utilized including caps, mask, sterile gowns, sterile gloves, sterile drape, hand hygiene and skin antiseptic. A timeout was performed prior to the initiation of the procedure. The patient was placed prone on the fluoroscopic table. The skin overlying the lumbar spine region was then prepped and draped in the usual sterile fashion. Maximal barrier sterile technique was utilized including caps, mask, sterile gowns, sterile gloves, sterile drape, hand  hygiene and skin antiseptic. Intravenous Fentanyl and Versed were administered as conscious sedation during continuous cardiorespiratory monitoring by the radiology RN. The left pedicle at L5 was then infiltrated with 1% lidocaine followed by the advancement of a Kyphon trocar needle through the left pedicle into the posterior one-third of the vertebral body. The trocar was removed and a bone biopsy was obtained at this location. Subsequently, the osteo drill was advanced to the anterior third of the vertebral body. The osteo drill was retracted. Through the working cannula, a 20 mm Osetocool RF ablation probe was inserted and positioned under fluoroscopic guidance. In similar fashion, the right L5 pedicle was infiltrated with 1% lidocaine followed by the advancement of a second Kyphon trocar needle through the right pedicle into the posterior third of the vertebral body. Again, a bone biopsy was obtained at this location. Subsequently, the osteo drill was coaxially advanced to the anterior right third. The osteo drill was exchanged for a 20 mm Oseto cool RF ablation probe which was positioned under fluoroscopic guidance. With both Osteocool ablation probes in place, the L5 ablation was performed for 15 minutes. ------------------------------------------------------------------------ While the L5 ablation was being performed, attention was paid towards the L1 vertebral body. The left pedicle at L1 was then infiltrated with 1% lidocaine followed by the advancement of a Kyphon trocar needle through the left pedicle into the posterior one-third of the vertebral body. The trocar was removed and a bone biopsy was obtained at this location. Subsequently, the osteo drill was advanced to the anterior third of the vertebral body. The osteo drill was retracted. Through the working cannula, a 15 mm Osetocool RF ablation probe was inserted and positioned under fluoroscopic guidance. In similar fashion, the right L1 pedicle was  infiltrated with 1% lidocaine followed by the advancement of a second Kyphon trocar needle through the right pedicle into the posterior third of the vertebral body. Again, a bone biopsy was obtained at this location. Subsequently, the osteo drill was coaxially advanced to the anterior right third. The osteo drill was exchanged for a 15 mm Oseto cool RF ablation probe which was positioned under fluoroscopic guidance. With both Osteocool ablation probes in place, the L1 ablation was performed for 11.5 minutes. ------------------------------------------------------------------------ Attention was now paid towards the kyphoplasty portion of the procedure. Beginning at the L5 vertebral body level, Kyphon balloons measuring 15 mm in length were advanced through both working cannulas and positioned with the distal markers approximately 5 mm from the anterior aspect of the cortex at  both the L1 and L5 vertebral body levels. Appropriate positioning was confirmed on the AP projection. At this time, both balloons were expanded using contrast via a Kyphon inflation syringe device via micro tubing. At this time, methylmethacrylate mixture was reconstituted in the Kyphon bone mixing device system. This was then loaded into the delivery mechanism, attached to Kyphon bone fillers. The balloons were deflated and removed followed by the instillation of methylmethacrylate mixture with excellent filling in the AP and lateral projections beginning at the L1 and subsequently at the L5 vertebral body. The working cannulae and the bone filler were then retrieved and removed. Multiple spot radiographic images were obtained in various obliquities. Hemostasis was achieved with manual compression. The patient tolerated the procedure well without immediate postprocedural complication though not surprising was uncomfortable during the kyphoplasty portion of the procedure, especially at the L5 vertebral body. FINDINGS: Completion images  demonstrate a technically excellent result with adequate cement filling of the L1 vertebral body both on the AP and lateral radiographs. No extravasation is noted in the disc space are posteriorly into the spinal canal. No epidural venous contamination seen. There is adequate filling of the L5 vertebral body, primarily at the location of the kyphoplasty balloons. A small amount cement is noted potentially external to the left lateral aspect of the L5 vertebral body. No extravasation is noted within the disc space or posteriorly into the spinal canal. IMPRESSION: 1. Technically successful L1 and L5 vertebral body ablation and cement augmentation using balloon kyphoplasty. 2. Technically successful L1 and L5 vertebral body biopsy. PLAN: - Continued in patient management as per the providing clinical team. - The patient will be seen in follow-up consultation at the IR clinic in approximately 3-4 weeks. Electronically Signed   By: Sandi Mariscal M.D.   On: 09/11/2019 17:43   IR Bone Tumor(s)RF Ablation  Result Date: 09/11/2019 CLINICAL DATA:  History of metastatic breast cancer, now with symptomatic L1 and L5 malignant compression fractures. Patient presents today for fluoroscopic guided L1 and L5 vertebral body bone biopsy, RF ablation and kyphoplasty/cement augmentation. EXAM: 1. FLUOROSCOPIC GUIDED L1 VERTEBRAL BODY BIOPSY, RF ABLATION AND KYPHOPLASTY/CEMENT AUGMENTATION. 2. FLUOROSCOPIC GUIDED L5 VERTEBRAL BODY BIOPSY, RF ABLATION AND KYPHOPLASTY/CEMENT AUGMENTATION. COMPARISON:  CT the chest, abdomen pelvis-02/02/2019; lumbar spine MRI-09/08/2019 MEDICATIONS: Toradol 300 mg IV; Ancef 2 g IV; The antibiotic was administered in an appropriate time interval prior to needle puncture of the skin. ANESTHESIA/SEDATION: Moderate (conscious) sedation was employed during this procedure. A total of Versed 6 mg and Fentanyl 300 mcg was administered intravenously. Moderate Sedation Time: 85 minutes. The patient's level of  consciousness and vital signs were monitored continuously by radiology nursing throughout the procedure under my direct supervision. FLUOROSCOPY TIME:  17 minutes, 36 seconds (262 mGy) COMPLICATIONS: None immediate. TECHNIQUE: Informed written consent was obtained from the patient after a thorough discussion of the procedural risks, benefits and alternatives. All questions were addressed. Maximal Sterile Barrier Technique was utilized including caps, mask, sterile gowns, sterile gloves, sterile drape, hand hygiene and skin antiseptic. A timeout was performed prior to the initiation of the procedure. The patient was placed prone on the fluoroscopic table. The skin overlying the lumbar spine region was then prepped and draped in the usual sterile fashion. Maximal barrier sterile technique was utilized including caps, mask, sterile gowns, sterile gloves, sterile drape, hand hygiene and skin antiseptic. Intravenous Fentanyl and Versed were administered as conscious sedation during continuous cardiorespiratory monitoring by the radiology RN. The left pedicle at L5 was  then infiltrated with 1% lidocaine followed by the advancement of a Kyphon trocar needle through the left pedicle into the posterior one-third of the vertebral body. The trocar was removed and a bone biopsy was obtained at this location. Subsequently, the osteo drill was advanced to the anterior third of the vertebral body. The osteo drill was retracted. Through the working cannula, a 20 mm Osetocool RF ablation probe was inserted and positioned under fluoroscopic guidance. In similar fashion, the right L5 pedicle was infiltrated with 1% lidocaine followed by the advancement of a second Kyphon trocar needle through the right pedicle into the posterior third of the vertebral body. Again, a bone biopsy was obtained at this location. Subsequently, the osteo drill was coaxially advanced to the anterior right third. The osteo drill was exchanged for a 20 mm  Oseto cool RF ablation probe which was positioned under fluoroscopic guidance. With both Osteocool ablation probes in place, the L5 ablation was performed for 15 minutes. ------------------------------------------------------------------------ While the L5 ablation was being performed, attention was paid towards the L1 vertebral body. The left pedicle at L1 was then infiltrated with 1% lidocaine followed by the advancement of a Kyphon trocar needle through the left pedicle into the posterior one-third of the vertebral body. The trocar was removed and a bone biopsy was obtained at this location. Subsequently, the osteo drill was advanced to the anterior third of the vertebral body. The osteo drill was retracted. Through the working cannula, a 15 mm Osetocool RF ablation probe was inserted and positioned under fluoroscopic guidance. In similar fashion, the right L1 pedicle was infiltrated with 1% lidocaine followed by the advancement of a second Kyphon trocar needle through the right pedicle into the posterior third of the vertebral body. Again, a bone biopsy was obtained at this location. Subsequently, the osteo drill was coaxially advanced to the anterior right third. The osteo drill was exchanged for a 15 mm Oseto cool RF ablation probe which was positioned under fluoroscopic guidance. With both Osteocool ablation probes in place, the L1 ablation was performed for 11.5 minutes. ------------------------------------------------------------------------ Attention was now paid towards the kyphoplasty portion of the procedure. Beginning at the L5 vertebral body level, Kyphon balloons measuring 15 mm in length were advanced through both working cannulas and positioned with the distal markers approximately 5 mm from the anterior aspect of the cortex at both the L1 and L5 vertebral body levels. Appropriate positioning was confirmed on the AP projection. At this time, both balloons were expanded using contrast via a Kyphon  inflation syringe device via micro tubing. At this time, methylmethacrylate mixture was reconstituted in the Kyphon bone mixing device system. This was then loaded into the delivery mechanism, attached to Kyphon bone fillers. The balloons were deflated and removed followed by the instillation of methylmethacrylate mixture with excellent filling in the AP and lateral projections beginning at the L1 and subsequently at the L5 vertebral body. The working cannulae and the bone filler were then retrieved and removed. Multiple spot radiographic images were obtained in various obliquities. Hemostasis was achieved with manual compression. The patient tolerated the procedure well without immediate postprocedural complication though not surprising was uncomfortable during the kyphoplasty portion of the procedure, especially at the L5 vertebral body. FINDINGS: Completion images demonstrate a technically excellent result with adequate cement filling of the L1 vertebral body both on the AP and lateral radiographs. No extravasation is noted in the disc space are posteriorly into the spinal canal. No epidural venous contamination seen. There is adequate  filling of the L5 vertebral body, primarily at the location of the kyphoplasty balloons. A small amount cement is noted potentially external to the left lateral aspect of the L5 vertebral body. No extravasation is noted within the disc space or posteriorly into the spinal canal. IMPRESSION: 1. Technically successful L1 and L5 vertebral body ablation and cement augmentation using balloon kyphoplasty. 2. Technically successful L1 and L5 vertebral body biopsy. PLAN: - Continued in patient management as per the providing clinical team. - The patient will be seen in follow-up consultation at the IR clinic in approximately 3-4 weeks. Electronically Signed   By: Sandi Mariscal M.D.   On: 09/11/2019 17:43   IR KYPHO LUMBAR INC FX REDUCE BONE BX UNI/BIL CANNULATION INC/IMAGING  Result  Date: 09/11/2019 CLINICAL DATA:  History of metastatic breast cancer, now with symptomatic L1 and L5 malignant compression fractures. Patient presents today for fluoroscopic guided L1 and L5 vertebral body bone biopsy, RF ablation and kyphoplasty/cement augmentation. EXAM: 1. FLUOROSCOPIC GUIDED L1 VERTEBRAL BODY BIOPSY, RF ABLATION AND KYPHOPLASTY/CEMENT AUGMENTATION. 2. FLUOROSCOPIC GUIDED L5 VERTEBRAL BODY BIOPSY, RF ABLATION AND KYPHOPLASTY/CEMENT AUGMENTATION. COMPARISON:  CT the chest, abdomen pelvis-02/02/2019; lumbar spine MRI-09/08/2019 MEDICATIONS: Toradol 300 mg IV; Ancef 2 g IV; The antibiotic was administered in an appropriate time interval prior to needle puncture of the skin. ANESTHESIA/SEDATION: Moderate (conscious) sedation was employed during this procedure. A total of Versed 6 mg and Fentanyl 300 mcg was administered intravenously. Moderate Sedation Time: 85 minutes. The patient's level of consciousness and vital signs were monitored continuously by radiology nursing throughout the procedure under my direct supervision. FLUOROSCOPY TIME:  17 minutes, 36 seconds (811 mGy) COMPLICATIONS: None immediate. TECHNIQUE: Informed written consent was obtained from the patient after a thorough discussion of the procedural risks, benefits and alternatives. All questions were addressed. Maximal Sterile Barrier Technique was utilized including caps, mask, sterile gowns, sterile gloves, sterile drape, hand hygiene and skin antiseptic. A timeout was performed prior to the initiation of the procedure. The patient was placed prone on the fluoroscopic table. The skin overlying the lumbar spine region was then prepped and draped in the usual sterile fashion. Maximal barrier sterile technique was utilized including caps, mask, sterile gowns, sterile gloves, sterile drape, hand hygiene and skin antiseptic. Intravenous Fentanyl and Versed were administered as conscious sedation during continuous cardiorespiratory  monitoring by the radiology RN. The left pedicle at L5 was then infiltrated with 1% lidocaine followed by the advancement of a Kyphon trocar needle through the left pedicle into the posterior one-third of the vertebral body. The trocar was removed and a bone biopsy was obtained at this location. Subsequently, the osteo drill was advanced to the anterior third of the vertebral body. The osteo drill was retracted. Through the working cannula, a 20 mm Osetocool RF ablation probe was inserted and positioned under fluoroscopic guidance. In similar fashion, the right L5 pedicle was infiltrated with 1% lidocaine followed by the advancement of a second Kyphon trocar needle through the right pedicle into the posterior third of the vertebral body. Again, a bone biopsy was obtained at this location. Subsequently, the osteo drill was coaxially advanced to the anterior right third. The osteo drill was exchanged for a 20 mm Oseto cool RF ablation probe which was positioned under fluoroscopic guidance. With both Osteocool ablation probes in place, the L5 ablation was performed for 15 minutes. ------------------------------------------------------------------------ While the L5 ablation was being performed, attention was paid towards the L1 vertebral body. The left pedicle at L1 was  then infiltrated with 1% lidocaine followed by the advancement of a Kyphon trocar needle through the left pedicle into the posterior one-third of the vertebral body. The trocar was removed and a bone biopsy was obtained at this location. Subsequently, the osteo drill was advanced to the anterior third of the vertebral body. The osteo drill was retracted. Through the working cannula, a 15 mm Osetocool RF ablation probe was inserted and positioned under fluoroscopic guidance. In similar fashion, the right L1 pedicle was infiltrated with 1% lidocaine followed by the advancement of a second Kyphon trocar needle through the right pedicle into the posterior  third of the vertebral body. Again, a bone biopsy was obtained at this location. Subsequently, the osteo drill was coaxially advanced to the anterior right third. The osteo drill was exchanged for a 15 mm Oseto cool RF ablation probe which was positioned under fluoroscopic guidance. With both Osteocool ablation probes in place, the L1 ablation was performed for 11.5 minutes. ------------------------------------------------------------------------ Attention was now paid towards the kyphoplasty portion of the procedure. Beginning at the L5 vertebral body level, Kyphon balloons measuring 15 mm in length were advanced through both working cannulas and positioned with the distal markers approximately 5 mm from the anterior aspect of the cortex at both the L1 and L5 vertebral body levels. Appropriate positioning was confirmed on the AP projection. At this time, both balloons were expanded using contrast via a Kyphon inflation syringe device via micro tubing. At this time, methylmethacrylate mixture was reconstituted in the Kyphon bone mixing device system. This was then loaded into the delivery mechanism, attached to Kyphon bone fillers. The balloons were deflated and removed followed by the instillation of methylmethacrylate mixture with excellent filling in the AP and lateral projections beginning at the L1 and subsequently at the L5 vertebral body. The working cannulae and the bone filler were then retrieved and removed. Multiple spot radiographic images were obtained in various obliquities. Hemostasis was achieved with manual compression. The patient tolerated the procedure well without immediate postprocedural complication though not surprising was uncomfortable during the kyphoplasty portion of the procedure, especially at the L5 vertebral body. FINDINGS: Completion images demonstrate a technically excellent result with adequate cement filling of the L1 vertebral body both on the AP and lateral radiographs. No  extravasation is noted in the disc space are posteriorly into the spinal canal. No epidural venous contamination seen. There is adequate filling of the L5 vertebral body, primarily at the location of the kyphoplasty balloons. A small amount cement is noted potentially external to the left lateral aspect of the L5 vertebral body. No extravasation is noted within the disc space or posteriorly into the spinal canal. IMPRESSION: 1. Technically successful L1 and L5 vertebral body ablation and cement augmentation using balloon kyphoplasty. 2. Technically successful L1 and L5 vertebral body biopsy. PLAN: - Continued in patient management as per the providing clinical team. - The patient will be seen in follow-up consultation at the IR clinic in approximately 3-4 weeks. Electronically Signed   By: Sandi Mariscal M.D.   On: 09/11/2019 17:43   IR KYPHO EA ADDL LEVEL THORACIC OR LUMBAR  Result Date: 09/11/2019 CLINICAL DATA:  History of metastatic breast cancer, now with symptomatic L1 and L5 malignant compression fractures. Patient presents today for fluoroscopic guided L1 and L5 vertebral body bone biopsy, RF ablation and kyphoplasty/cement augmentation. EXAM: 1. FLUOROSCOPIC GUIDED L1 VERTEBRAL BODY BIOPSY, RF ABLATION AND KYPHOPLASTY/CEMENT AUGMENTATION. 2. FLUOROSCOPIC GUIDED L5 VERTEBRAL BODY BIOPSY, RF ABLATION AND KYPHOPLASTY/CEMENT AUGMENTATION.  COMPARISON:  CT the chest, abdomen pelvis-02/02/2019; lumbar spine MRI-09/08/2019 MEDICATIONS: Toradol 300 mg IV; Ancef 2 g IV; The antibiotic was administered in an appropriate time interval prior to needle puncture of the skin. ANESTHESIA/SEDATION: Moderate (conscious) sedation was employed during this procedure. A total of Versed 6 mg and Fentanyl 300 mcg was administered intravenously. Moderate Sedation Time: 85 minutes. The patient's level of consciousness and vital signs were monitored continuously by radiology nursing throughout the procedure under my direct  supervision. FLUOROSCOPY TIME:  17 minutes, 36 seconds (818 mGy) COMPLICATIONS: None immediate. TECHNIQUE: Informed written consent was obtained from the patient after a thorough discussion of the procedural risks, benefits and alternatives. All questions were addressed. Maximal Sterile Barrier Technique was utilized including caps, mask, sterile gowns, sterile gloves, sterile drape, hand hygiene and skin antiseptic. A timeout was performed prior to the initiation of the procedure. The patient was placed prone on the fluoroscopic table. The skin overlying the lumbar spine region was then prepped and draped in the usual sterile fashion. Maximal barrier sterile technique was utilized including caps, mask, sterile gowns, sterile gloves, sterile drape, hand hygiene and skin antiseptic. Intravenous Fentanyl and Versed were administered as conscious sedation during continuous cardiorespiratory monitoring by the radiology RN. The left pedicle at L5 was then infiltrated with 1% lidocaine followed by the advancement of a Kyphon trocar needle through the left pedicle into the posterior one-third of the vertebral body. The trocar was removed and a bone biopsy was obtained at this location. Subsequently, the osteo drill was advanced to the anterior third of the vertebral body. The osteo drill was retracted. Through the working cannula, a 20 mm Osetocool RF ablation probe was inserted and positioned under fluoroscopic guidance. In similar fashion, the right L5 pedicle was infiltrated with 1% lidocaine followed by the advancement of a second Kyphon trocar needle through the right pedicle into the posterior third of the vertebral body. Again, a bone biopsy was obtained at this location. Subsequently, the osteo drill was coaxially advanced to the anterior right third. The osteo drill was exchanged for a 20 mm Oseto cool RF ablation probe which was positioned under fluoroscopic guidance. With both Osteocool ablation probes in place,  the L5 ablation was performed for 15 minutes. ------------------------------------------------------------------------ While the L5 ablation was being performed, attention was paid towards the L1 vertebral body. The left pedicle at L1 was then infiltrated with 1% lidocaine followed by the advancement of a Kyphon trocar needle through the left pedicle into the posterior one-third of the vertebral body. The trocar was removed and a bone biopsy was obtained at this location. Subsequently, the osteo drill was advanced to the anterior third of the vertebral body. The osteo drill was retracted. Through the working cannula, a 15 mm Osetocool RF ablation probe was inserted and positioned under fluoroscopic guidance. In similar fashion, the right L1 pedicle was infiltrated with 1% lidocaine followed by the advancement of a second Kyphon trocar needle through the right pedicle into the posterior third of the vertebral body. Again, a bone biopsy was obtained at this location. Subsequently, the osteo drill was coaxially advanced to the anterior right third. The osteo drill was exchanged for a 15 mm Oseto cool RF ablation probe which was positioned under fluoroscopic guidance. With both Osteocool ablation probes in place, the L1 ablation was performed for 11.5 minutes. ------------------------------------------------------------------------ Attention was now paid towards the kyphoplasty portion of the procedure. Beginning at the L5 vertebral body level, Kyphon balloons measuring 15 mm in  length were advanced through both working cannulas and positioned with the distal markers approximately 5 mm from the anterior aspect of the cortex at both the L1 and L5 vertebral body levels. Appropriate positioning was confirmed on the AP projection. At this time, both balloons were expanded using contrast via a Kyphon inflation syringe device via micro tubing. At this time, methylmethacrylate mixture was reconstituted in the Kyphon bone  mixing device system. This was then loaded into the delivery mechanism, attached to Kyphon bone fillers. The balloons were deflated and removed followed by the instillation of methylmethacrylate mixture with excellent filling in the AP and lateral projections beginning at the L1 and subsequently at the L5 vertebral body. The working cannulae and the bone filler were then retrieved and removed. Multiple spot radiographic images were obtained in various obliquities. Hemostasis was achieved with manual compression. The patient tolerated the procedure well without immediate postprocedural complication though not surprising was uncomfortable during the kyphoplasty portion of the procedure, especially at the L5 vertebral body. FINDINGS: Completion images demonstrate a technically excellent result with adequate cement filling of the L1 vertebral body both on the AP and lateral radiographs. No extravasation is noted in the disc space are posteriorly into the spinal canal. No epidural venous contamination seen. There is adequate filling of the L5 vertebral body, primarily at the location of the kyphoplasty balloons. A small amount cement is noted potentially external to the left lateral aspect of the L5 vertebral body. No extravasation is noted within the disc space or posteriorly into the spinal canal. IMPRESSION: 1. Technically successful L1 and L5 vertebral body ablation and cement augmentation using balloon kyphoplasty. 2. Technically successful L1 and L5 vertebral body biopsy. PLAN: - Continued in patient management as per the providing clinical team. - The patient will be seen in follow-up consultation at the IR clinic in approximately 3-4 weeks. Electronically Signed   By: Sandi Mariscal M.D.   On: 09/11/2019 17:43   [place] PLACEMENT LEFT >5 YRS INC IMG GUIDE  Result Date: 09/11/2019 INDICATION: Poor venous access. In need of durable intravenous access for medication and blood draws while hospitalized. EXAM:  ULTRASOUND AND FLUOROSCOPIC GUIDED PICC LINE INSERTION MEDICATIONS: None. CONTRAST:  None FLUOROSCOPY TIME:  1 minute, 42 seconds (3 mGy) COMPLICATIONS: None immediate. TECHNIQUE: The procedure, risks, benefits, and alternatives were explained to the patient and informed written consent was obtained. A timeout was performed prior to the initiation of the procedure. The left upper extremity was prepped with chlorhexidine in a sterile fashion, and a sterile drape was applied covering the operative field. Maximum barrier sterile technique with sterile gowns and gloves were used for the procedure. A timeout was performed prior to the initiation of the procedure. Local anesthesia was provided with 1% lidocaine. Under direct ultrasound guidance, the brachial vein was accessed with a micropuncture kit after the overlying soft tissues were anesthetized with 1% lidocaine. After the overlying soft tissues were anesthetized, a small venotomy incision was created and a micropuncture kit was utilized to access the left brachial vein. Real-time ultrasound guidance was utilized for vascular access including the acquisition of a permanent ultrasound image documenting patency of the accessed vessel. A guidewire was advanced to the level of the superior caval-atrial junction for measurement purposes and the PICC line was cut to length. A peel-away sheath was placed and a 39 cm, 5 [place], single lumen was inserted to level of the superior caval-atrial junction. A post procedure spot fluoroscopic was obtained. The catheter easily aspirated  and flushed and was secured in place with stat lock device. A dressing was applied. The patient tolerated the procedure well without immediate post procedural complication. FINDINGS: After catheter placement, the tip lies within the superior cavoatrial junction. The catheter aspirates and flushes normally and is ready for immediate use. IMPRESSION: Successful ultrasound and fluoroscopic guided  placement of a left brachial vein approach, 39 cm, 5 [place], single lumen PICC with tip at the superior caval-atrial junction. The PICC line is ready for immediate use. Electronically Signed   By: Sandi Mariscal M.D.   On: 09/11/2019 17:45    Microbiology: Recent Results (from the past 240 hour(s))  Urine Culture     Status: Abnormal   Collection Time: 09/09/19 11:36 AM   Specimen: Urine, Clean Catch  Result Value Ref Range Status   Specimen Description   Final    URINE, CLEAN CATCH Performed at [facility], [place] [address], [place], [place] [address]    Special Requests   Final    NONE Performed at [facility], [place] [address], [place], [place] [address]    Culture MULTIPLE SPECIES PRESENT, SUGGEST RECOLLECTION (A)  Final   Report Status 09/10/2019 FINAL  Final  SARS CORONAVIRUS 2 (TAT 6-24 HRS) Nasopharyngeal Nasopharyngeal Swab     Status: None   Collection Time: 09/12/19 12:51 PM   Specimen: Nasopharyngeal Swab  Result Value Ref Range Status   SARS Coronavirus 2 NEGATIVE NEGATIVE Final    Comment: (NOTE) SARS-CoV-2 target nucleic acids are NOT DETECTED. The SARS-CoV-2 RNA is generally detectable in upper and lower respiratory specimens during the acute phase of infection. Negative results do not preclude SARS-CoV-2 infection, do not rule out co-infections with other pathogens, and should not be used as the sole basis for treatment or other patient management decisions. Negative results must be combined with clinical observations, patient history, and epidemiological information. The expected result is Negative. Fact Sheet for Patients: SugarRoll.be Fact Sheet for Healthcare Providers: https://www.woods-mathews.com/ This test is not yet approved or cleared by the [place] FDA and  has been authorized for detection and/or diagnosis of SARS-CoV-2 by FDA under an Emergency Use Authorization (EUA).  This EUA will remain  in effect (meaning this test can be used) for the duration of the COVID-19 declaration under Section 56 4(b)(1) of the Act, 21 U.S.C. section 360bbb-3(b)(1), unless the authorization is terminated or revoked sooner. Performed at [facility], [place] [address], [place], [place] [address]      Labs: Basic Metabolic Panel: Recent Labs  Lab 09/08/19 0249 09/11/19 0319 09/12/19 0329 09/13/19 0335  NA 135 132* 131* 134*  K 4.3 4.1 3.7 3.5  CL 99 96* 97* 98  CO2 _0 GLUCOSE 114* 102* 95 105*  BUN _1 CREATININE 0.57 0.61 0.50 0.46  CALCIUM 8.7* 8.7* 8.4* 8.4*  MG 2.0 2.0 1.9 1.9   Liver Function Tests: No results for input(s): AST, ALT, ALKPHOS, BILITOT, PROT, ALBUMIN in the last 168 hours. No results for input(s): LIPASE, AMYLASE in the last 168 hours. No results for input(s): AMMONIA in the last 168 hours. CBC: Recent Labs  Lab 09/08/19 0249 09/11/19 0319 09/12/19 0329  WBC 4.3 3.2* 3.6*  HGB 11.4* 10.5* 10.4*  HCT 35.1* 32.7* 32.4*  MCV 97.0 99.1 98.5  PLT 149* 154 154   Cardiac Enzymes: No results for input(s): CKTOTAL, CKMB, CKMBINDEX, TROPONINI in the last 168 hours. BNP: BNP (last 3  results) Recent Labs    01/31/19 1154  BNP 13.4    ProBNP (last 3 results) No results for input(s): PROBNP in the last 8760 hours.  CBG: No results for input(s): GLUCAP in the last 168 hours.     Signed:  Florencia Reasons MD, PhD, FACP  [facility] 09/13/2019, 11:29 AM

## 2019-09-14 ENCOUNTER — Other Ambulatory Visit: Payer: Self-pay | Admitting: *Deleted

## 2019-09-14 ENCOUNTER — Other Ambulatory Visit: Payer: Self-pay | Admitting: Interventional Radiology

## 2019-09-14 DIAGNOSIS — S32050A Wedge compression fracture of fifth lumbar vertebra, initial encounter for closed fracture: Secondary | ICD-10-CM

## 2019-09-14 DIAGNOSIS — S32010A Wedge compression fracture of first lumbar vertebra, initial encounter for closed fracture: Secondary | ICD-10-CM

## 2019-09-14 NOTE — Patient Outreach (Signed)
[facility]) Care Management  09/14/2019  Erica Young Mar 17, 1941 [id number]   [place] coordination-notification skilled nursing placement [facility] RN CM notified that Erica Young discharged to [facility] skilled nursing facility (snf) on 09/14/19  Plan [facility] RN CM cancelled scheduled [facility] outreach for 09/17/19 and will monitor for case closure per [facility] outreach workflow process Routed to MDs   Seligman. Lavina Hamman, RN, BSN, [place] Coordinator Office number [phone] Mobile number [phone]  Main [facility] number [phone] Fax number [phone]

## 2019-09-16 ENCOUNTER — Ambulatory Visit: Payer: Self-pay | Admitting: *Deleted

## 2019-09-17 LAB — SURGICAL PATHOLOGY

## 2019-09-24 ENCOUNTER — Inpatient Hospital Stay: Payer: Medicare Other

## 2019-09-24 ENCOUNTER — Ambulatory Visit: Payer: Medicare Other | Admitting: Oncology

## 2019-09-25 ENCOUNTER — Other Ambulatory Visit: Payer: Self-pay | Admitting: *Deleted

## 2019-09-25 NOTE — Patient Outreach (Signed)
Screened for potential [facility] Care Management needs as a benefit of  [employer].  Erica Young is receiving skilled therapy at [facility].   Member is from home alone with family support. She was active with [facility] palliative and [facility] Care Management prior.   Anticipated transition plan is to return home with hired caregiver support.   Communication sent to [facility] palliative to make aware member is at [facility].   Will continue to follow while member resides in SNF.   Marthenia Rolling, MSN-Ed, RN,BSN [place] Acute Care Coordinator [phone] [facility]) [phone]  (Toll free office)

## 2019-09-29 ENCOUNTER — Other Ambulatory Visit: Payer: Self-pay | Admitting: *Deleted

## 2019-09-29 NOTE — Patient Outreach (Signed)
[facility]) Care Management  09/29/2019  Erica Young 24-Dec-1940 [id number]   Case closure - Consumer enrolled in an external program   Collaboration with [place] acute care coordinator, A Hall Pt remains at [facility] since 09/13/19    Plan [facility] RN CM will close case as she remains at [place] enrolled in an external program Per [place] post acute care coordinator will refer patient to [facility] RN CM prn  Case closure letters sent to patient and MD- Dr Suzan Slick L. Lavina Hamman, RN, BSN, [place] Coordinator Office number [phone] Mobile number [phone]  Main [facility] number [phone] Fax number [phone]

## 2019-09-30 NOTE — Progress Notes (Incomplete)
  Patient Name: Erica Young MRN: [id number] DOB: [birth year] Referring Physician: Lurline Del (Profile Not Attached) Date of Service: 09/04/2019 [facility], [place]                                                        End Of Treatment Note  Diagnoses: C79.51-Secondary malignant neoplasm of bone  Cancer Staging: Metastatic stage IV right breast cancer  Intent: Palliative  Radiation Treatment Dates: 08/17/2019 through 09/04/2019 Site Technique Total Dose (Gy) Dose per Fx (Gy) Completed Fx Beam Energies  Lumbar Spine: Spine 3D 35/35 2.5 14/14 10X, 15X   Narrative: The patient tolerated radiation therapy relatively well. She continued to report back and hip pain that was not easily relieved with pain medication. She also reported mild fatigue only at times. She denied nausea.  She was seen in the ED on 08/18/2019 for back pain but did not wish to have narcotic pain medications for fear of constipation. She did miss her radiation treatment on that day in light of that ED visit. She was seen in the ED again on 09/03/2019 for back pain. X-ray of lumbar spine showed a pathologic L1 compression fracture with 50% height loss and metastatic disease of L5. Chest x-ray showed right hilar adenopathy and multiple known pulmonary nodules that were poorly visualized. There was no acute cardiopulmonary abnormality. The patient was admitted to the hospital for further management. She completed her radiation therapy as an inpatient.  Plan: The patient will follow-up with radiation oncology in one month.  ________________________________________________   Blair Promise, PhD, MD  This document serves as a record of services personally performed by Gery Pray, MD. It was created on his behalf by Clerance Lav, a trained medical scribe.  The creation of this record is based on the scribe's personal observations and the provider's statements to them. This document has been checked and approved by the attending provider.

## 2019-10-06 ENCOUNTER — Other Ambulatory Visit: Payer: Self-pay | Admitting: *Deleted

## 2019-10-06 ENCOUNTER — Non-Acute Institutional Stay: Payer: Medicare Other

## 2019-10-06 ENCOUNTER — Other Ambulatory Visit: Payer: Self-pay

## 2019-10-06 ENCOUNTER — Telehealth: Payer: Self-pay

## 2019-10-06 DIAGNOSIS — Z515 Encounter for palliative care: Secondary | ICD-10-CM

## 2019-10-06 DIAGNOSIS — R05 Cough: Secondary | ICD-10-CM | POA: Diagnosis not present

## 2019-10-06 NOTE — Progress Notes (Signed)
COMMUNITY PALLIATIVE CARE SW NOTE  PATIENT NAME: Erica Young DOB: June 22, 1940 MRN: [id number]  PRIMARY CARE PROVIDER: Isaac Bliss, Rayford Halsted, MD  RESPONSIBLE PARTY:  Acct ID - Guarantor Home Phone Work Phone Relationship Acct Type  [id number] - Mc[place]urry,[phone]  Self P/F     [address], [place], [place] [address]     PLAN OF CARE and INTERVENTIONS:             1. GOALS OF CARE/ ADVANCE CARE PLANNING:  Patient is a DNR. Goals are undetermined at this time.  2. SOCIAL/EMOTIONAL/SPIRITUAL ASSESSMENT/ INTERVENTIONS:  Palliative care social workers-M.Jowanna Loeffler and A. Henrene Pastor visited with patient at the facility [facility]). Patient was in bed. She reported that she was not doing well. She reported pain to her back, feet and bottom. Patient also reported that she was having shortness of breath and wanted some 02. Patient appeared to be short of breath as she talked. Patient also appeared to display some anxiety. Patient had other concerns that SW addressed with the facility nurse-Fatu. SW's assisted patient with comfort by assisting her with reposition her in bed, raising her head and turning on her air. SW consulted with the nurse and the NP-Lee. They advised that patient had a chest x-ray done that revealed she had a pneumonia. The NP advised that she will be prescribing and antibiotic for patient annd will also order patient to have 1L of 02 as needed. Patient is currently receiving physical therapy. Speech therapy was discontinued. Patient verbalized that she was comfortable by the end of the visit. Patient was able to share her social history around when she initially came to this country. She at one point became tearful and stated she could not longer talk about it. However, she was able to express her appreciation to the team and asked that we return. SW provided supportive presence, active listening, assistance to alleviate pain and discomfort, consulted with the facility staff and reinforced  acces to palliative care support.    3. PATIENT/CAREGIVER EDUCATION/ COPING: Patient was alert and oriented x3, but anxious.She seemed calmer with one-to-one support, and attention to her needs. Overall she seems to be adjusting adequately to the facility.  4. PERSONAL EMERGENCY PLAN:  Per facility protocol.  5. COMMUNITY RESOURCES COORDINATION/ HEALTH CARE NAVIGATION:  Patient is currently receiving physical therapy.  6. FINANCIAL/LEGAL CONCERNS/INTERVENTIONS:  No financial or legal concerns.       SOCIAL HX:  Social History   Tobacco Use  . Smoking status: Former Smoker    Packs/day: 0.50    Years: 20.00    Pack years: 10.00    Types: Cigarettes    Start date: 70    Quit date: 05/01/1991    Years since quitting: 28.4  . Smokeless tobacco: Never Used  Substance Use Topics  . Alcohol use: No    Alcohol/week: 0.0 standard drinks    CODE STATUS: DNR ADVANCED DIRECTIVES: No MOST FORM COMPLETE:  No HOSPICE EDUCATION PROVIDED: Yes  PPS: Patient is currently receiving physical therapy. She is having generalized pain and shortness of breath. She is also being treated for pneumonia.   Duration of visit and documentation: [address], LCSW

## 2019-10-06 NOTE — Patient Outreach (Signed)
[facility] Post- Acute Care Coordinator follow up  Erica Young remains at [facility] and [place].   Telephone call made to son/DPR Erica Young [phone]. Patient identifiers confirmed. Richard states he is not sure what the transition plan is. States member is in quite a bit of pain. States they are trying to get pain under control. Conversation was brief.  Noted [facility] palliative is following member is SNF.   Will continue to follow while member resides in [place].   Marthenia Rolling, MSN-Ed, RN,BSN [place] Acute Care Coordinator [phone] [facility]) [phone]  (Toll free office)

## 2019-10-08 ENCOUNTER — Telehealth: Payer: Self-pay | Admitting: *Deleted

## 2019-10-08 ENCOUNTER — Ambulatory Visit
Admission: RE | Admit: 2019-10-08 | Discharge: 2019-10-08 | Disposition: A | Discharge status: Home / Self Care | Payer: Medicare Other | Source: Ambulatory Visit | Attending: Radiation Oncology | Admitting: Radiation Oncology

## 2019-10-08 NOTE — Telephone Encounter (Signed)
Called patient to reschedule missed fu appt. For 10-08-19, no answer, unable to leave message

## 2019-10-09 ENCOUNTER — Other Ambulatory Visit: Payer: Self-pay | Admitting: *Deleted

## 2019-10-09 NOTE — Patient Outreach (Signed)
Late entry for 10/08/19  Screened for potential [facility] Care Management needs as a benefit of  [employer] Medicare.  Mrs. Jager remains at [facility].   Writer attended telephonic interdisciplinary team meeting to assess for disposition needs and transition plan for resident.   Facility reports dc planning meeting is scheduled for Monday. Palliative care is still following. Continues to have back pain. Level of assistance with therapy varies due to pain. Facility recommending 24/7 at home.   Will continue to follow for transition plans.   Marthenia Rolling, MSN-Ed, RN,BSN [place] Acute Care Coordinator [phone] [facility]) [phone]  (Toll free office)

## 2019-10-20 DIAGNOSIS — I10 Essential (primary) hypertension: Secondary | ICD-10-CM | POA: Diagnosis not present

## 2019-10-20 DIAGNOSIS — Z7982 Long term (current) use of aspirin: Secondary | ICD-10-CM | POA: Diagnosis not present

## 2019-10-20 DIAGNOSIS — E440 Moderate protein-calorie malnutrition: Secondary | ICD-10-CM | POA: Diagnosis not present

## 2019-10-20 DIAGNOSIS — C50919 Malignant neoplasm of unspecified site of unspecified female breast: Secondary | ICD-10-CM | POA: Diagnosis not present

## 2019-10-20 DIAGNOSIS — E039 Hypothyroidism, unspecified: Secondary | ICD-10-CM | POA: Diagnosis not present

## 2019-10-20 DIAGNOSIS — Z8673 Personal history of transient ischemic attack (TIA), and cerebral infarction without residual deficits: Secondary | ICD-10-CM | POA: Diagnosis not present

## 2019-10-20 DIAGNOSIS — F419 Anxiety disorder, unspecified: Secondary | ICD-10-CM | POA: Diagnosis not present

## 2019-10-20 DIAGNOSIS — Z79891 Long term (current) use of opiate analgesic: Secondary | ICD-10-CM | POA: Diagnosis not present

## 2019-10-20 DIAGNOSIS — C7951 Secondary malignant neoplasm of bone: Secondary | ICD-10-CM | POA: Diagnosis not present

## 2019-10-20 DIAGNOSIS — M8458XD Pathological fracture in neoplastic disease, other specified site, subsequent encounter for fracture with routine healing: Secondary | ICD-10-CM | POA: Diagnosis not present

## 2019-10-20 DIAGNOSIS — C787 Secondary malignant neoplasm of liver and intrahepatic bile duct: Secondary | ICD-10-CM | POA: Diagnosis not present

## 2019-10-20 DIAGNOSIS — M5416 Radiculopathy, lumbar region: Secondary | ICD-10-CM | POA: Diagnosis not present

## 2019-10-21 ENCOUNTER — Telehealth: Payer: Self-pay | Admitting: Internal Medicine

## 2019-10-21 NOTE — Telephone Encounter (Signed)
Liji from [place] want a call back for verbal order on this pt  At [phone].

## 2019-10-21 NOTE — Telephone Encounter (Signed)
I am not removing the order as I believe she would benefit from PT. They can always document her refusal to participate.

## 2019-10-21 NOTE — Telephone Encounter (Signed)
Verbal orders for [facility], PT, and nursing given to [place].

## 2019-10-21 NOTE — Telephone Encounter (Signed)
Meredith with [facility] called for dr. Jerilee Hoh to do verbal order to discharge order for PT. Patient refused service.

## 2019-10-21 NOTE — Telephone Encounter (Signed)
Verbal orders for [facility], PT, and nursing given to [place].

## 2019-10-22 ENCOUNTER — Other Ambulatory Visit: Payer: Self-pay | Admitting: *Deleted

## 2019-10-22 ENCOUNTER — Ambulatory Visit
Admission: RE | Admit: 2019-10-22 | Discharge: 2019-10-22 | Disposition: A | Discharge status: Home / Self Care | Payer: Medicare Other | Source: Ambulatory Visit | Attending: Interventional Radiology | Admitting: Interventional Radiology

## 2019-10-22 ENCOUNTER — Telehealth: Payer: Self-pay | Admitting: *Deleted

## 2019-10-22 ENCOUNTER — Other Ambulatory Visit: Payer: Self-pay

## 2019-10-22 DIAGNOSIS — S32050A Wedge compression fracture of fifth lumbar vertebra, initial encounter for closed fracture: Secondary | ICD-10-CM

## 2019-10-22 DIAGNOSIS — S32010A Wedge compression fracture of first lumbar vertebra, initial encounter for closed fracture: Secondary | ICD-10-CM

## 2019-10-22 DIAGNOSIS — I10 Essential (primary) hypertension: Secondary | ICD-10-CM

## 2019-10-22 NOTE — Patient Outreach (Signed)
[facility] Post-Acute Care Coordinator follow up  Mrs. Swartzlander transitioned from [facility] on 10/19/19 with caregiver assistance thru [facility] [place] [facility]. She was active with [facility] palliative and [facility] RNCM prior.   Will re-refer to [facility] as member does not appear to be active with hospice at this time.  Communication of member's dc from [place] also sent to [facility] palliative.  Marthenia Rolling, MSN-Ed, RN,BSN [place] Acute Care Coordinator [phone] [facility]) [phone]  (Toll free office)

## 2019-10-22 NOTE — Telephone Encounter (Signed)
Received a communication from [place], RN with [facility] who advised that patient has returned home from [facility] on 10/19/19 and questioned if there were plans for her to go under hospice care. She was scheduled for a hospice AV previously, however she was admitted at the hospital at that time and then discharged to [facility] for rehab.  I called patient and asked if she wanted to schedule another hospice AV visit and she says that she wants to speak with her son regarding this first. She does not want to make this decision as of yet. She asked that I reach out to her again in a few weeks to see how she is doing. I agreed. She will remain under [facility] Palliative care services for now.

## 2019-10-22 NOTE — Progress Notes (Signed)
Patient ID: Erica Young, female   DOB: [birth year], 79 y.o.   MRN: [id number]        Chief Complaint: Post L1 and L5 biopsy, RF ablation and cement augmentation  Referring Physician(s): Magrinat (Oncology)  History of Present Illness: Erica Young is a 79 y.o. female with past medical history significant for cerebrovascular disease, hyperlipidemia, hypertension, hypothyroidism and stage IV breast cancer with liver and osseous metastasis who underwent technically successful L1 and L5 vertebral body biopsy, RF ablation and kyphoplasty/cement augmentation on 09/11/2019 (note, L1 biopsy was nondiagnostic however L5 biopsy confirmed metastatic breast cancer).  Attempt was made to see the patient via telemedicine consultation today however the patient remains within a facility and was unable to make today's appointment.     Past Medical History:  Diagnosis Date   Allergy    Anxiety    Bone cancer ([place]) mri 11/11/14   right parietal bone,left cribriform plate metastases   Cancer ([place]) 2007   right breat ca  , lumpectomy and radiation tx (declined chemo and additional prophylactic meds)   Cataract    both eyes  10/2016 Lt.     04/2017 Rt.    CEREBROVASCULAR DISEASE 03/03/2010   Diverticulitis    DIVERTICULOSIS, COLON 03/03/2010   Fatty liver 08/02/09   as per U/S done by [facility] Radiology   Foramen ovale    positive bubble study   GERD (gastroesophageal reflux disease)    Headache(784.0)    she thinks sinus headaches   History of cerebral artery stenosis    right middle   HYPERLIPIDEMIA 03/03/2010   Hypertension    HYPOTHYROIDISM 03/03/2010   no longer on meds   Internal hemorrhoid    Low back pain    Mass of sinus    Mastoiditis    noted on brain MRI   Rib fracture    Right leg pain    Stroke [facility]) Oct. 2011   TIA   Ulcer     Past Surgical History:  Procedure Laterality Date   BREAST LUMPECTOMY     right   CATARACT EXTRACTION Left    10/2016   Rt 04/2017    CHOLECYSTECTOMY     COLONOSCOPY     IR BONE TUMOR(S)RF ABLATION  09/11/2019   IR BONE TUMOR(S)RF ABLATION  09/11/2019   IR KYPHO EA ADDL LEVEL THORACIC OR LUMBAR  09/11/2019   IR KYPHO LUMBAR INC FX REDUCE BONE BX UNI/BIL CANNULATION INC/IMAGING  09/11/2019   POLYPECTOMY     benign cecum   SHOULDER SURGERY     right shoulder (dislocation)   TONSILLECTOMY     TUBAL LIGATION     UPPER GASTROINTESTINAL ENDOSCOPY     VIDEO BRONCHOSCOPY WITH ENDOBRONCHIAL ULTRASOUND N/A 01/07/2014   Procedure: VIDEO BRONCHOSCOPY WITH ENDOBRONCHIAL ULTRASOUND;  Surgeon: Ivin Poot, MD;  Location: [facility] OR;  Service: Thoracic;  Laterality: N/A;    Allergies: Ciprofloxacin  Medications: Prior to Admission medications   Medication Sig Start Date End Date Taking? Authorizing Provider  ALPRAZolam (XANAX) 0.25 MG tablet Take 1 tablet (0.25 mg total) by mouth 3 (three) times daily as needed. for anxiety 09/13/19   Florencia Reasons, MD  aluminum-magnesium hydroxide 200-200 MG/5ML suspension Take by mouth every 6 (six) hours as needed for indigestion.    [provider]  aspirin EC 81 MG tablet Take 81 mg by mouth daily after breakfast.     [provider]  carvedilol (COREG) 12.5 MG tablet Take 1 tablet (12.5 mg total)  by mouth 2 (two) times daily with a meal. 06/05/19   Isaac Bliss, Rayford Halsted, MD  Cholecalciferol (VITAMIN D PO) Take 1,000 Units by mouth daily.     [provider]  cyclobenzaprine (FLEXERIL) 5 MG tablet Take 1 tablet (5 mg total) by mouth 3 (three) times daily. 09/13/19   Florencia Reasons, MD  diclofenac Sodium (VOLTAREN) 1 % GEL Apply 2 g topically 4 (four) times daily. To affected area 09/13/19   Florencia Reasons, MD  dronabinol (MARINOL) 2.5 MG capsule Take 1 capsule (2.5 mg total) by mouth daily before supper. 09/13/19   Florencia Reasons, MD  exemestane (AROMASIN) 25 MG tablet Take 1 tablet (25 mg total) by mouth daily after breakfast. 07/27/19   Magrinat, Virgie Dad, MD  feeding supplement, ENSURE ENLIVE,  (ENSURE ENLIVE) LIQD Take 237 mLs by mouth 3 (three) times daily between meals. 09/13/19   Florencia Reasons, MD  gabapentin (NEURONTIN) 100 MG capsule Take 1 capsule (100 mg total) by mouth 3 (three) times daily. 09/13/19 10/13/19  Florencia Reasons, MD  Glucosamine 500 MG CAPS Take 500 mg by mouth in the morning and at bedtime.     [provider]  HYDROcodone-acetaminophen (NORCO/VICODIN) 5-325 MG tablet Take 1 tablet by mouth every 6 (six) hours as needed. 09/13/19   Florencia Reasons, MD  ibuprofen (ADVIL) 400 MG tablet Take 1 tablet (400 mg total) by mouth every 6 (six) hours as needed for moderate pain. 09/13/19   Florencia Reasons, MD  loratadine (CLARITIN) 10 MG tablet Take 1 tablet (10 mg total) by mouth daily. 04/16/19   Isaac Bliss, Rayford Halsted, MD  losartan (COZAAR) 25 MG tablet Take 1 tablet (25 mg total) by mouth daily. 09/14/19   Florencia Reasons, MD  Multiple Vitamin (MULTIVITAMIN WITH MINERALS) TABS tablet Take 1 tablet by mouth daily. 09/14/19   Florencia Reasons, MD  ondansetron (ZOFRAN-ODT) 4 MG disintegrating tablet Take 1 tablet (4 mg total) by mouth every 8 (eight) hours as needed for nausea or vomiting. 08/18/19   Davonna Belling, MD  oxyCODONE (OXYCONTIN) 15 mg 12 hr tablet Take 1 tablet (15 mg total) by mouth every 12 (twelve) hours. 09/13/19   Florencia Reasons, MD  pantoprazole (PROTONIX) 40 MG tablet Take 1 tablet (40 mg total) by mouth daily. 09/14/19   Florencia Reasons, MD  polyethylene glycol (MIRALAX / GLYCOLAX) 17 g packet Take 17 g by mouth 2 (two) times daily. 09/13/19   Florencia Reasons, MD  senna-docusate (SENOKOT-S) 8.6-50 MG tablet Take 2 tablets by mouth 2 (two) times daily. Hold if diarrhea 09/13/19   Florencia Reasons, MD     Family History  Problem Relation Age of Onset   Heart disease Sister        cerebral vascular disease also   Heart disease Brother        cerebral vascular disease also   Heart disease Brother        cerebral vascular disease   Heart disease Sister        cebreal vascular disease also   Heart disease Sister         cebreal vascular diease also   Heart disease Sister        cerebral vascular diease also   Stomach cancer Father    Colon cancer Neg Hx    Esophageal cancer Neg Hx    Rectal cancer Neg Hx    Colon polyps Neg Hx    Asthma Neg Hx     Social History  Socioeconomic History   Marital status: Widowed    Spouse name: [name] Deceased in April 10, 2023   Number of children: 2   Years of education: 88   Highest education level: Master's degree (e.g., MA, MS, MEng, MEd, MSW, MBA)  Occupational History   Occupation: Social Worker--Retired  Tobacco Use   Smoking status: Former Smoker    Packs/day: 0.50    Years: 20.00    Pack years: 10.00    Types: Cigarettes    Start date: 1973    Quit date: 05/01/1991    Years since quitting: 28.4   Smokeless tobacco: Never Used  Vaping Use   Vaping Use: Never used  Substance and Sexual Activity   Alcohol use: No    Alcohol/week: 0.0 standard drinks   Drug use: No   Sexual activity: Yes  Other Topics Concern   Not on file  Social History Narrative   Daily caffeine    Social Determinants of Health   Financial Resource Strain: Low Risk    Difficulty of Paying Living Expenses: Not hard at all  Food Insecurity: No Food Insecurity   Worried About Charity fundraiser in the Last Year: Never true   [place] in the Last Year: Never true  Transportation Needs: No Transportation Needs   Lack of Transportation (Medical): No   Lack of Transportation (Non-Medical): No  Physical Activity: Inactive   Days of Exercise per Week: 0 days   Minutes of Exercise per Session: 0 min  Stress: Stress Concern Present   Feeling of Stress : Rather much  Social Connections: Socially Isolated   Frequency of Communication with Friends and Family: Once a week   Frequency of Social Gatherings with Friends and Family: Once a week   Attends Religious Services: More than 4 times per year   Active Member of [employer] or Organizations: No   Attends Archivist  Meetings: Never   Marital Status: Widowed    ECOG Status: 2 - Symptomatic, <50% confined to bed  Review of Systems  Review of Systems: A 12 point ROS discussed and pertinent positives are indicated in the HPI above.  All other systems are negative.  Physical Exam No direct physical exam was performed (except for noted visual exam findings with Video Visits).   Vital Signs: There were no vitals taken for this visit.  Imaging:  Intraprocedural images during L1 and L5 vertebral body biopsy, RF ablation and cement augmentation-09/11/2019 Lumbar spine MRI-09/08/2019  Labs:  CBC: Recent Labs    09/02/19 2351 09/02/19 2351 09/03/19 0000 09/08/19 0249 09/11/19 0319 09/12/19 0329  WBC 4.1  --   --  4.3 3.2* 3.6*  HGB 11.7*   < > 11.9* 11.4* 10.5* 10.4*  HCT 35.6*   < > 35.0* 35.1* 32.7* 32.4*  PLT 160  --   --  149* 154 154   < > = values in this interval not displayed.    COAGS: Recent Labs    01/29/19 1048 09/07/19 0910  INR 1.1 1.0  APTT 28  --     BMP: Recent Labs    09/08/19 0249 09/11/19 0319 09/12/19 0329 09/13/19 0335  NA 135 132* 131* 134*  K 4.3 4.1 3.7 3.5  CL 99 96* 97* 98  CO2 28 28 26 28   GLUCOSE 114* 102* 95 105*  BUN 22 18 15 11   CALCIUM 8.7* 8.7* 8.4* 8.4*  CREATININE 0.57 0.61 0.50 0.46  GFRNONAA >60 >60 >60 >60  GFRAA >60 >  60 >60 >60    LIVER FUNCTION TESTS: Recent Labs    01/23/19 1516 01/29/19 0855 01/29/19 1048 06/24/19 0808  BILITOT 0.4 0.3 0.6 0.5  AST 22 16 28 17   ALT 19 13 16 13   ALKPHOS 82 82 73 114  PROT 7.2 7.2 7.1 7.0  ALBUMIN 4.0 3.7 4.0 3.8    TUMOR MARKERS: No results for input(s): AFPTM, CEA, CA199, CHROMGRNA in the last 8760 hours.  Assessment and Plan:  Marvia Troost is a 79 y.o. female with past medical history significant for cerebrovascular disease, hyperlipidemia, hypertension, hypothyroidism and stage IV breast cancer with liver and osseous metastasis who underwent technically successful L1 and L5  vertebral body biopsy, RF ablation and kyphoplasty/cement augmentation on 09/11/2019 (note, L1 biopsy was nondiagnostic however L5 biopsy confirmed metastatic breast cancer).  Attempt was made to see the patient via telemedicine consultation today however the patient remains within a facility and was unable to make today's appointment.  As such, the patient may follow-up with the interventional radiology clinic at the discretion of Dr. Jana Hakim  A copy of this report was sent to the requesting provider on this date.  Electronically Signed: Sandi Mariscal 10/22/2019, 10:15 AM

## 2019-10-23 ENCOUNTER — Other Ambulatory Visit: Payer: Self-pay | Admitting: *Deleted

## 2019-10-23 ENCOUNTER — Encounter: Payer: Self-pay | Admitting: *Deleted

## 2019-10-23 DIAGNOSIS — C50919 Malignant neoplasm of unspecified site of unspecified female breast: Secondary | ICD-10-CM | POA: Diagnosis not present

## 2019-10-23 DIAGNOSIS — E440 Moderate protein-calorie malnutrition: Secondary | ICD-10-CM | POA: Diagnosis not present

## 2019-10-23 DIAGNOSIS — M8458XD Pathological fracture in neoplastic disease, other specified site, subsequent encounter for fracture with routine healing: Secondary | ICD-10-CM | POA: Diagnosis not present

## 2019-10-23 DIAGNOSIS — M5416 Radiculopathy, lumbar region: Secondary | ICD-10-CM | POA: Diagnosis not present

## 2019-10-23 DIAGNOSIS — C787 Secondary malignant neoplasm of liver and intrahepatic bile duct: Secondary | ICD-10-CM | POA: Diagnosis not present

## 2019-10-23 DIAGNOSIS — C7951 Secondary malignant neoplasm of bone: Secondary | ICD-10-CM | POA: Diagnosis not present

## 2019-10-23 NOTE — Patient Outreach (Addendum)
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 Member transitioned from [facility] on 10/19/19. Previously active with [facility] RN CM and [facility] palliative. Could benefit from hospice services. Has hhc with Erica Young and Erica Young for private duty.  Pt reported not ready for hospice and that [facility] palliative will continue to follow  Insurance: [place] noted today 10/23/19 in Epic that [employer] is listed as primary coverage [facility] RN CM reviewed with Erica Young who states she had been informed of the change but it is NOT correct  Erica Young reports her [employer] Medicare is primary and confirms she has a [place] discussed with Erica Young that at anytime her [place]ur Rossetti is found to be primary she will be transitioned to designated [place] staff She voiced understanding   This [facility] RN CM has assisted this patient previously with referrals to [facility] on 02/27/19 after a discharge from [place] facility (snf) and then during that time an additional MD referral in 4/21/*21 from her primary care provider (PCP) office  Her [facility] telephonic case was closed on 09/29/19 when she admitted [facility]      Outreach successful to her home number  Patient is able to verify HIPAA, DOB and address Erica Young also gave permission during this 08/27/19 outreach for [facility] RN CM to speak with her grand daughter Erica Young Reviewed referral to [facility] Young with patient Discussed Transition of care assessment since discharge from snf    Pt has staff visiting with her during this outreach  who is assisting with her  Activities of daily living (ADLs) This staff is assisting her to make a meal  Pt stopped at intervals to address the staff She confirms this is staff from Erica Young private duty  she reports she is "doing well considering my circumstances" Pt is noted to pause in conversation and to cough at interval [facility] RN CM inquired about this cough She denies coughing up discolored mucus and states with increased speaking her mouth gets dry and initiates her cough She denies any concerns   With assessment, Erica Young confirms she has assistance at home from Erica Young and Erica Young for private duty She reports "they are treating me well"  She confirms she also hs palliative care visits  She confirms she has all prescriptions filled, has necessary DME, has food and pcp follow up appointment  She is not sure on her specialist follow up appointment and states she will speak with her home staff today about this  Agrees to further [facility] follow  Up calls     SocialMrs Erica Young is a 79 year old retired female who lives at home alone with family checking in on her. Her husband RobertStacerpasses on 03/02/19 at [place] place after she was d/c home from [facility] place on 02/27/19. She has supportive family and friends. Her son Erica Young and grand daughter, Erica Young, are supportive. She is able to complete ADLs and needs assist with iADLs and transportation from her son and family. She does understand and speak english but requests english to be spoken slower so she can  understand clearly.  As of her discharge home from [place] on 10/19/19 she has home health with Erica Young and [place] for private duty.  She has continual palliative care visits  Conditions:Breast cancer stage 4-estrogen receptor positive, liver and osseous metastasis who underwent technically successful L1 and L5 vertebral body biopsy, RF ablation and kyphoplasty/cement augmentation on 09/11/2019 (note, L1 biopsy was  nondiagnostic however L5 biopsy confirmed metastatic breast cancer). 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 Patient gave verbal consent for services [facility] Young telephonic RN CM.   Plan: [facility] RN CM will follow up with Erica Young as agreed on today within the next 7-10 days for further post snf transition of care services [facility] RN CM sent a Welcome outreach letter with [facility] brochure, [place], [facility] consent form with return envelope and know before you go sheet enclosed for review MD involvement barriers letter sent   Routed note to MD  [place] Problem One     Most Recent Value  Care Plan Problem One Breast cancer with bone,liver mets, CHF, HTN home care needs  Role Documenting the Problem One Care Management Telephonic Coordinator  Care Plan for Problem One Active  THN Long Term Goal  over the next 60 days patient will receive knowledge of services and resources for home care of breast cancer with spine mets, chf, htn as verbalized during outreach calls  [place] Term Goal Start Date 10/23/19  Interventions for Problem One Long Term Goal transition of care assessment after snf d/c, answered questions, verify primary insurance  THN CM Short Term Goal #1   over the next 30 days patient will have follow up MD appointments to pcp and specialists prn as verbalized in outreach calls  [facility] CM Short Term Goal #1 Start Date 10/23/19  Interventions for Short Term Goal #1 transition of care assessment after snf d/c, answered questions, verify primary insurance       Xabi Wittler L. Lavina Hamman, RN, BSN, [place] Coordinator Office number [phone] Mobile number [phone]  Main THN number [phone] Fax number [phone]

## 2019-10-27 ENCOUNTER — Other Ambulatory Visit: Payer: Self-pay | Admitting: *Deleted

## 2019-10-27 NOTE — Telephone Encounter (Signed)
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   " it is getting worse and is swollen and bulging out " " I am afraid I am going to loose my vision and maybe even my eye "  She was seen by Rad Onc for above - but she declined due to " he said I would loose my vision in both my eyes because I had radiation to this area before "  She is asking if she should see an " eye doctor " - " so maybe I can put some medication on it "  Note pt was d/ced from [facility]  On 6/21 after being there for completion of radiation and rehab for kyphoplasty to her back.  This RN will review above with covering provider and return call to the patient.

## 2019-10-28 DIAGNOSIS — C7951 Secondary malignant neoplasm of bone: Secondary | ICD-10-CM | POA: Diagnosis not present

## 2019-10-28 DIAGNOSIS — C50919 Malignant neoplasm of unspecified site of unspecified female breast: Secondary | ICD-10-CM | POA: Diagnosis not present

## 2019-10-28 DIAGNOSIS — C787 Secondary malignant neoplasm of liver and intrahepatic bile duct: Secondary | ICD-10-CM | POA: Diagnosis not present

## 2019-10-28 DIAGNOSIS — M5416 Radiculopathy, lumbar region: Secondary | ICD-10-CM | POA: Diagnosis not present

## 2019-10-28 DIAGNOSIS — E440 Moderate protein-calorie malnutrition: Secondary | ICD-10-CM | POA: Diagnosis not present

## 2019-10-28 DIAGNOSIS — M8458XD Pathological fracture in neoplastic disease, other specified site, subsequent encounter for fracture with routine healing: Secondary | ICD-10-CM | POA: Diagnosis not present

## 2019-10-30 ENCOUNTER — Ambulatory Visit: Payer: Self-pay | Admitting: *Deleted

## 2019-10-30 DIAGNOSIS — C787 Secondary malignant neoplasm of liver and intrahepatic bile duct: Secondary | ICD-10-CM | POA: Diagnosis not present

## 2019-10-30 DIAGNOSIS — M8458XD Pathological fracture in neoplastic disease, other specified site, subsequent encounter for fracture with routine healing: Secondary | ICD-10-CM | POA: Diagnosis not present

## 2019-10-30 DIAGNOSIS — C50919 Malignant neoplasm of unspecified site of unspecified female breast: Secondary | ICD-10-CM | POA: Diagnosis not present

## 2019-10-30 DIAGNOSIS — M5416 Radiculopathy, lumbar region: Secondary | ICD-10-CM | POA: Diagnosis not present

## 2019-10-30 DIAGNOSIS — E440 Moderate protein-calorie malnutrition: Secondary | ICD-10-CM | POA: Diagnosis not present

## 2019-10-30 DIAGNOSIS — C7951 Secondary malignant neoplasm of bone: Secondary | ICD-10-CM | POA: Diagnosis not present

## 2019-11-03 ENCOUNTER — Telehealth: Payer: Self-pay | Admitting: Internal Medicine

## 2019-11-03 ENCOUNTER — Other Ambulatory Visit: Payer: Self-pay

## 2019-11-03 ENCOUNTER — Other Ambulatory Visit: Payer: Self-pay | Admitting: *Deleted

## 2019-11-03 NOTE — Telephone Encounter (Signed)
Patient is requesting a refill:  gabapentin (NEURONTIN) 100 MG capsule(Expired)  She is having pain in her legs and feet.  She has used this medication in the past and it helped.

## 2019-11-03 NOTE — Telephone Encounter (Signed)
Related for prescription. I asked what the name of the prescription and she said that she would have to ask Apolonio Schneiders.   Please advise

## 2019-11-03 NOTE — Telephone Encounter (Signed)
Erica Young from [facility] [phone]  Needs verbal orders for discharge from OT.   The patient didn't want any OT.

## 2019-11-03 NOTE — Addendum Note (Signed)
Addended by: Westley Hummer B on: 11/03/2019 04:16 PM   Modules accepted: Orders

## 2019-11-03 NOTE — Patient Outreach (Signed)
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 Member transitioned from [facility] on 10/[facility]. Previously active with [facility] RN CM and [facility]. Could benefit from hospice services. Has hhc with Nanine Means and Michiel [place] for private duty.  Pt reported not ready for hospice and that [facility] palliative will continue to follow  Insurance: [place] noted today 10/23/19 in Epic that [employer] is listed as primary coverage [facility] RN CM reviewed with Erica Young who states she had been informed of the change but it is NOT correct  Erica Young reports her [employer] Medicare is primary and confirms she has a [place] discussed with Erica Young that at anytime her Mcarthur Rossetti is found to be primary she will be transitioned to designated [place] staff She voiced understanding   This [facility] RN CM has assisted this patient previously with referrals to [facility] on 02/27/19 after a discharge from [place] facility (snf) and then during that time an additional MD referral in 4/21/*21 from her primary care provider (PCP) office  Her [facility] telephonic case was closed on 09/29/19 when she admitted to [facility] snf      Outreach successful to her home number  Patient is able to verify HIPAA, DOB  She was able to state her name and date of birth but with further attempts to complete HIPP address she was noted to be short of breath. THN RN CM began assessing for symptoms, distress  THN RN CM inquired if any of her MDs had been notified of her shortness of breath (sob) and she  needed [facility] RN CM to call someone for her. She confirms she is awaitng a call from her "nurse" and denied need of use of home oxygen when [facility] RN CM inquired if she had oxygen at home. "I don't need oxygen" She inquired if [facility] RN CM was her "Joelene Millin, the therapist" She requested to conclude the call to await a call from "my nurse"  SocialMrs Harlo Young is a 79 year old retired female who lives at home alone with family checking in on her. Her husband RobertStacerpasses on 03/02/19 at [place] place after she was d/c home from [place] place on 02/27/19. She has supportive family and friends. Her son Erica Young and grand daughter, Erica Young, are supportive. She is able to complete ADLs and needs assist with iADLs and transportation from her son and family. She does understand and speak english but requests english to be spoken slower so she can understand clearly.  As of her discharge home from [place] on 10/19/19 she has home health with Nanine Means and [place] for private duty.  She has continual palliative care visits  Conditions:Breast cancer stage 4-estrogen receptor positive, liver and osseous metastasis who underwenttechnically successfulL1 and L5 vertebral body biopsy, RF ablation and kyphoplasty/cement augmentation on 09/11/2019 (note, L1 biopsy was nondiagnostic however L5 biopsy confirmed metastatic breast cancer). 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 Lavina Hamman, RN, BSN, [place] Coordinator Office number [phone] Mobile number [phone]  Main THN number [phone] Fax number [phone]

## 2019-11-04 MED ORDER — GABAPENTIN 100 MG PO CAPS: 100.0000 mg | ORAL_CAPSULE | Freq: Three times a day (TID) | ORAL | 0 refills | Status: DC

## 2019-11-04 NOTE — Telephone Encounter (Signed)
They can document patient refusal to participate.

## 2019-11-05 DIAGNOSIS — M5416 Radiculopathy, lumbar region: Secondary | ICD-10-CM | POA: Diagnosis not present

## 2019-11-05 DIAGNOSIS — C50919 Malignant neoplasm of unspecified site of unspecified female breast: Secondary | ICD-10-CM | POA: Diagnosis not present

## 2019-11-05 DIAGNOSIS — C787 Secondary malignant neoplasm of liver and intrahepatic bile duct: Secondary | ICD-10-CM | POA: Diagnosis not present

## 2019-11-05 DIAGNOSIS — E440 Moderate protein-calorie malnutrition: Secondary | ICD-10-CM | POA: Diagnosis not present

## 2019-11-05 DIAGNOSIS — M8458XD Pathological fracture in neoplastic disease, other specified site, subsequent encounter for fracture with routine healing: Secondary | ICD-10-CM | POA: Diagnosis not present

## 2019-11-05 DIAGNOSIS — C7951 Secondary malignant neoplasm of bone: Secondary | ICD-10-CM | POA: Diagnosis not present

## 2019-11-06 DIAGNOSIS — C787 Secondary malignant neoplasm of liver and intrahepatic bile duct: Secondary | ICD-10-CM | POA: Diagnosis not present

## 2019-11-06 DIAGNOSIS — C50919 Malignant neoplasm of unspecified site of unspecified female breast: Secondary | ICD-10-CM | POA: Diagnosis not present

## 2019-11-06 DIAGNOSIS — M8458XD Pathological fracture in neoplastic disease, other specified site, subsequent encounter for fracture with routine healing: Secondary | ICD-10-CM | POA: Diagnosis not present

## 2019-11-06 DIAGNOSIS — M5416 Radiculopathy, lumbar region: Secondary | ICD-10-CM | POA: Diagnosis not present

## 2019-11-06 DIAGNOSIS — E440 Moderate protein-calorie malnutrition: Secondary | ICD-10-CM | POA: Diagnosis not present

## 2019-11-06 DIAGNOSIS — C7951 Secondary malignant neoplasm of bone: Secondary | ICD-10-CM | POA: Diagnosis not present

## 2019-11-09 DIAGNOSIS — M8458XD Pathological fracture in neoplastic disease, other specified site, subsequent encounter for fracture with routine healing: Secondary | ICD-10-CM | POA: Diagnosis not present

## 2019-11-09 DIAGNOSIS — C787 Secondary malignant neoplasm of liver and intrahepatic bile duct: Secondary | ICD-10-CM | POA: Diagnosis not present

## 2019-11-09 DIAGNOSIS — E440 Moderate protein-calorie malnutrition: Secondary | ICD-10-CM | POA: Diagnosis not present

## 2019-11-09 DIAGNOSIS — C7951 Secondary malignant neoplasm of bone: Secondary | ICD-10-CM | POA: Diagnosis not present

## 2019-11-09 DIAGNOSIS — C50919 Malignant neoplasm of unspecified site of unspecified female breast: Secondary | ICD-10-CM | POA: Diagnosis not present

## 2019-11-09 DIAGNOSIS — M5416 Radiculopathy, lumbar region: Secondary | ICD-10-CM | POA: Diagnosis not present

## 2019-11-10 ENCOUNTER — Encounter: Payer: Self-pay | Admitting: Internal Medicine

## 2019-11-10 ENCOUNTER — Other Ambulatory Visit: Payer: Self-pay

## 2019-11-10 ENCOUNTER — Ambulatory Visit (INDEPENDENT_AMBULATORY_CARE_PROVIDER_SITE_OTHER): Payer: Medicare Other | Admitting: Internal Medicine

## 2019-11-10 VITALS — BP 98/64 | HR 115 | Temp 98.2°F

## 2019-11-10 DIAGNOSIS — C50919 Malignant neoplasm of unspecified site of unspecified female breast: Secondary | ICD-10-CM

## 2019-11-10 DIAGNOSIS — M545 Low back pain: Secondary | ICD-10-CM | POA: Diagnosis not present

## 2019-11-10 DIAGNOSIS — C50411 Malignant neoplasm of upper-outer quadrant of right female breast: Secondary | ICD-10-CM

## 2019-11-10 DIAGNOSIS — G8929 Other chronic pain: Secondary | ICD-10-CM

## 2019-11-10 DIAGNOSIS — J3489 Other specified disorders of nose and nasal sinuses: Secondary | ICD-10-CM

## 2019-11-10 DIAGNOSIS — Z170 Estrogen receptor positive status [ER+]: Secondary | ICD-10-CM

## 2019-11-10 DIAGNOSIS — S32010S Wedge compression fracture of first lumbar vertebra, sequela: Secondary | ICD-10-CM

## 2019-11-10 MED ORDER — HYDROCODONE-ACETAMINOPHEN 5-325 MG PO TABS: 1.0000 | ORAL_TABLET | Freq: Four times a day (QID) | ORAL | 0 refills | Status: DC | PRN

## 2019-11-10 MED ORDER — ALPRAZOLAM 0.25 MG PO TABS: 0.2500 mg | ORAL_TABLET | Freq: Three times a day (TID) | ORAL | 0 refills | Status: DC | PRN

## 2019-11-10 MED ORDER — OXYCODONE HCL ER 15 MG PO T12A: 15.0000 mg | EXTENDED_RELEASE_TABLET | Freq: Two times a day (BID) | ORAL | 0 refills | Status: DC

## 2019-11-10 MED ORDER — ALPRAZOLAM 0.25 MG PO TABS: 0.2500 mg | ORAL_TABLET | Freq: Three times a day (TID) | ORAL | 0 refills | Status: AC | PRN

## 2019-11-10 NOTE — Progress Notes (Signed)
Established Patient Office Visit     This visit occurred during the SARS-CoV-2 public health emergency.  Safety protocols were in place, including screening questions prior to the visit, additional usage of staff PPE, and extensive cleaning of exam room while observing appropriate contact time as indicated for disinfecting solutions.    CC/Reason for Visit: Hospital follow-up  HPI: Erica Young is a 79 y.o. female who is coming in today for the above mentioned reasons.  She was hospitalized from 5/5-5/16/2021 due to excruciating lower back pain.  She has a history of stage IV breast cancer metastatic to liver and bone among other issues.  She had been receiving radiation to a metastases to her back.  She was found to have acute pathologic fractures at L1 and L5.  She is status post RF ablation and cement augmentation of the L1 and L5 vertebral bodies on 5/14.  She was fitted for a TLSO brace but tells me she does not like to wear the brace.  Biopsies from those vertebral bodies did not obtain significant for metastatic cancer.  She ended up going to skilled nursing facility for rehab for 4 weeks but has since been discharged.  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  She needs refills of pain medication today.  She appears much more fragile and is now in a wheelchair, she has lost weight, she is very weak.  She is doing home health PT since discharge from SNF.   Past Medical/Surgical History: Past Medical History:  Diagnosis Date  . Allergy   . Anxiety   . Bone cancer ([place]) mri 11/11/14   right parietal bone,left cribriform plate metastases  . Cancer [facility]) 2007   right breat ca  , lumpectomy and radiation tx (declined chemo and additional prophylactic meds)  . Cataract    both eyes  10/2016 Lt.     04/2017 Rt.   . CEREBROVASCULAR DISEASE 03/03/2010  .  Diverticulitis   . DIVERTICULOSIS, COLON 03/03/2010  . Fatty liver 08/02/09   as per U/S done by [facility]  . Foramen ovale    positive bubble study  . GERD (gastroesophageal reflux disease)   . Headache(784.0)    she thinks sinus headaches  . History of cerebral artery stenosis    right middle  . HYPERLIPIDEMIA 03/03/2010  . Hypertension   . HYPOTHYROIDISM 03/03/2010   no longer on meds  . Internal hemorrhoid   . Low back pain   . Mass of sinus   . Mastoiditis    noted on brain MRI  . Rib fracture   . Right leg pain   . Stroke [facility]) Oct. 2011   TIA  . Ulcer     Past Surgical History:  Procedure Laterality Date  . BREAST LUMPECTOMY     right  . CATARACT EXTRACTION Left    10/2016   Rt 04/2017  . CHOLECYSTECTOMY    . COLONOSCOPY    . IR BONE TUMOR(S)RF ABLATION  09/11/2019  . IR BONE TUMOR(S)RF ABLATION  09/11/2019  . IR KYPHO EA ADDL LEVEL THORACIC OR LUMBAR  09/11/2019  . IR KYPHO LUMBAR INC FX REDUCE BONE BX UNI/BIL CANNULATION INC/IMAGING  09/11/2019  . POLYPECTOMY     benign cecum  . SHOULDER SURGERY     right shoulder (dislocation)  . TONSILLECTOMY    .  TUBAL LIGATION    . UPPER GASTROINTESTINAL ENDOSCOPY    . VIDEO BRONCHOSCOPY WITH ENDOBRONCHIAL ULTRASOUND N/A 01/07/2014   Procedure: VIDEO BRONCHOSCOPY WITH ENDOBRONCHIAL ULTRASOUND;  Surgeon: Ivin Poot, MD;  Location: [facility];  Service: Thoracic;  Laterality: N/A;    Social History:  reports that she quit smoking about 28 years ago. Her smoking use included cigarettes. She started smoking about 48 years ago. She has a 10.00 pack-year smoking history. She has never used smokeless tobacco. She reports that she does not drink alcohol and does not use drugs.  Allergies: Allergies  Allergen Reactions  . Ciprofloxacin Anaphylaxis    Family History:  Family History  Problem Relation Age of Onset  . Heart disease Sister        cerebral vascular disease also  . Heart disease Brother        cerebral  vascular disease also  . Heart disease Brother        cerebral vascular disease  . Heart disease Sister        cebreal vascular disease also  . Heart disease Sister        cebreal vascular diease also  . Heart disease Sister        cerebral vascular diease also  . Stomach cancer Father   . Colon cancer Neg Hx   . Esophageal cancer Neg Hx   . Rectal cancer Neg Hx   . Colon polyps Neg Hx   . Asthma Neg Hx      Current Outpatient Medications:  .  ALPRAZolam (XANAX) 0.25 MG tablet, Take 1 tablet (0.25 mg total) by mouth 3 (three) times daily as needed. for anxiety, Disp: 15 tablet, Rfl: 0 .  aluminum-magnesium hydroxide 200-200 MG/5ML suspension, Take by mouth every 6 (six) hours as needed for indigestion., Disp: , Rfl:  .  aspirin EC 81 MG tablet, Take 81 mg by mouth daily after breakfast. , Disp: , Rfl:  .  carvedilol (COREG) 12.5 MG tablet, Take 1 tablet (12.5 mg total) by mouth 2 (two) times daily with a meal., Disp: 180 tablet, Rfl: 1 .  Cholecalciferol (VITAMIN D PO), Take 1,000 Units by mouth daily. , Disp: , Rfl:  .  cyclobenzaprine (FLEXERIL) 5 MG tablet, Take 1 tablet (5 mg total) by mouth 3 (three) times daily., Disp: 30 tablet, Rfl: 0 .  diclofenac Sodium (VOLTAREN) 1 % GEL, Apply 2 g topically 4 (four) times daily. To affected area, Disp: 50 g, Rfl: 0 .  dronabinol (MARINOL) 2.5 MG capsule, Take 1 capsule (2.5 mg total) by mouth daily before supper., Disp:  , Rfl:  .  exemestane (AROMASIN) 25 MG tablet, Take 1 tablet (25 mg total) by mouth daily after breakfast., Disp: 60 tablet, Rfl: 6 .  feeding supplement, ENSURE ENLIVE, (ENSURE ENLIVE) LIQD, Take 237 mLs by mouth 3 (three) times daily between meals., Disp: 237 mL, Rfl: 12 .  gabapentin (NEURONTIN) 100 MG capsule, Take 1 capsule (100 mg total) by mouth 3 (three) times daily., Disp: 90 capsule, Rfl: 0 .  Glucosamine 500 MG CAPS, Take 500 mg by mouth in the morning and at bedtime. , Disp: , Rfl:  .  HYDROcodone-acetaminophen  (NORCO/VICODIN) 5-325 MG tablet, Take 1 tablet by mouth every 6 (six) hours as needed., Disp: 30 tablet, Rfl: 0 .  ibuprofen (ADVIL) 400 MG tablet, Take 1 tablet (400 mg total) by mouth every 6 (six) hours as needed for moderate pain., Disp: 30 tablet, Rfl: 0 .  loratadine (  CLARITIN) 10 MG tablet, Take 1 tablet (10 mg total) by mouth daily., Disp: 30 tablet, Rfl: 11 .  losartan (COZAAR) 25 MG tablet, Take 1 tablet (25 mg total) by mouth daily., Disp:  , Rfl:  .  Multiple Vitamin (MULTIVITAMIN WITH MINERALS) TABS tablet, Take 1 tablet by mouth daily., Disp:  , Rfl:  .  ondansetron (ZOFRAN-ODT) 4 MG disintegrating tablet, Take 1 tablet (4 mg total) by mouth every 8 (eight) hours as needed for nausea or vomiting., Disp: 8 tablet, Rfl: 0 .  oxyCODONE (OXYCONTIN) 15 mg 12 hr tablet, Take 1 tablet (15 mg total) by mouth every 12 (twelve) hours., Disp: 60 tablet, Rfl: 0 .  pantoprazole (PROTONIX) 40 MG tablet, Take 1 tablet (40 mg total) by mouth daily., Disp:  , Rfl:  .  polyethylene glycol (MIRALAX / GLYCOLAX) 17 g packet, Take 17 g by mouth 2 (two) times daily., Disp: 14 each, Rfl: 0 .  senna-docusate (SENOKOT-S) 8.6-50 MG tablet, Take 2 tablets by mouth 2 (two) times daily. Hold if diarrhea, Disp: 60 tablet, Rfl: 0  Review of Systems:  Constitutional: Denies fever, chills, diaphoresis, appetite change. HEENT: Denies photophobia, eye pain, redness, hearing loss, ear pain, congestion, sore throat, rhinorrhea, sneezing, mouth sores, trouble swallowing, neck pain, neck stiffness and tinnitus.   Respiratory: Denies SOB, DOE, cough, chest tightness,  and wheezing.   Cardiovascular: Denies chest pain, palpitations and leg swelling.  Gastrointestinal: Denies nausea, vomiting, abdominal pain, diarrhea, constipation, blood in stool and abdominal distention.  Genitourinary: Denies dysuria, urgency, frequency, hematuria, flank pain and difficulty urinating.  Endocrine: Denies: hot or cold intolerance, sweats,  changes in hair or nails, polyuria, polydipsia. Musculoskeletal: Denies myalgias, back pain, joint swelling, arthralgias and gait problem.  Skin: Denies pallor, rash and wound.  Neurological: Denies dizziness, seizures, syncope, light-headedness, numbness and headaches.  Hematological: Denies adenopathy. Easy bruising, personal or family bleeding history  Psychiatric/Behavioral: Denies suicidal ideation, mood changes, confusion, nervousness, sleep disturbance and agitation    Physical Exam: Vitals:   11/10/19 1448  BP: 98/64  Pulse: (!) 115  Temp: 98.2 F (36.8 C)  TempSrc: Temporal  SpO2: 95%    There is no height or weight on file to calculate BMI.   Constitutional: NAD, calm, comfortable, cachectic Eyes: Left eye exophthalmos with external deviation and inability to completely close eyelid well clinically. ENMT: Mucous membranes are moist.  Respiratory: clear to auscultation bilaterally, no wheezing, no crackles. Normal respiratory effort. No accessory muscle use.  Cardiovascular: Regular rate and rhythm, no murmurs / rubs / gallops. No extremity edema.  Psychiatric: Normal judgment and insight. Alert and oriented x 3.  Mood appears depressed above pleasant.   Impression and Plan:  Chronic bilateral low back pain without sciatica Malignant neoplasm of upper-outer quadrant of right breast in female, estrogen receptor positive ([place]) Metastatic breast cancer (HCC) Mass of nasal sinus Compression fracture of L1 vertebra, sequela -Refill of OxyContin and hydrocodone and Xanax. -Continue home health PT, prognosis is guarded. -Advised to follow-up with radiation oncology as scheduled.     Lelon Frohlich, MD [facility]

## 2019-11-10 NOTE — Addendum Note (Signed)
Addended by: Westley Hummer B on: 11/10/2019 03:37 PM   Modules accepted: Orders

## 2019-11-10 NOTE — Addendum Note (Signed)
Addended by: Lelon Frohlich Y on: 11/10/2019 03:52 PM   Modules accepted: Orders

## 2019-11-11 ENCOUNTER — Telehealth: Payer: Self-pay | Admitting: *Deleted

## 2019-11-11 ENCOUNTER — Other Ambulatory Visit: Payer: Self-pay | Admitting: Internal Medicine

## 2019-11-11 DIAGNOSIS — C50919 Malignant neoplasm of unspecified site of unspecified female breast: Secondary | ICD-10-CM

## 2019-11-11 MED ORDER — MORPHINE SULFATE ER 15 MG PO TBCR: 15.0000 mg | EXTENDED_RELEASE_TABLET | Freq: Two times a day (BID) | ORAL | 0 refills | Status: DC

## 2019-11-11 NOTE — Telephone Encounter (Signed)
Oxycontin 15 mg has been denied Patient is okay with changing RX/

## 2019-11-11 NOTE — Telephone Encounter (Signed)
Rx for MS Contin 15 mg twice daily has been sent in substitution of the OxyContin.

## 2019-11-12 DIAGNOSIS — E440 Moderate protein-calorie malnutrition: Secondary | ICD-10-CM | POA: Diagnosis not present

## 2019-11-12 DIAGNOSIS — C7951 Secondary malignant neoplasm of bone: Secondary | ICD-10-CM | POA: Diagnosis not present

## 2019-11-12 DIAGNOSIS — M8458XD Pathological fracture in neoplastic disease, other specified site, subsequent encounter for fracture with routine healing: Secondary | ICD-10-CM | POA: Diagnosis not present

## 2019-11-12 DIAGNOSIS — M5416 Radiculopathy, lumbar region: Secondary | ICD-10-CM | POA: Diagnosis not present

## 2019-11-12 DIAGNOSIS — C50919 Malignant neoplasm of unspecified site of unspecified female breast: Secondary | ICD-10-CM | POA: Diagnosis not present

## 2019-11-12 DIAGNOSIS — C787 Secondary malignant neoplasm of liver and intrahepatic bile duct: Secondary | ICD-10-CM | POA: Diagnosis not present

## 2019-11-17 ENCOUNTER — Ambulatory Visit: Payer: Self-pay | Admitting: *Deleted

## 2019-11-17 DIAGNOSIS — M5416 Radiculopathy, lumbar region: Secondary | ICD-10-CM | POA: Diagnosis not present

## 2019-11-17 DIAGNOSIS — M8458XD Pathological fracture in neoplastic disease, other specified site, subsequent encounter for fracture with routine healing: Secondary | ICD-10-CM | POA: Diagnosis not present

## 2019-11-17 DIAGNOSIS — C7951 Secondary malignant neoplasm of bone: Secondary | ICD-10-CM | POA: Diagnosis not present

## 2019-11-17 DIAGNOSIS — C50919 Malignant neoplasm of unspecified site of unspecified female breast: Secondary | ICD-10-CM | POA: Diagnosis not present

## 2019-11-17 DIAGNOSIS — E440 Moderate protein-calorie malnutrition: Secondary | ICD-10-CM | POA: Diagnosis not present

## 2019-11-17 DIAGNOSIS — C787 Secondary malignant neoplasm of liver and intrahepatic bile duct: Secondary | ICD-10-CM | POA: Diagnosis not present

## 2019-11-19 DIAGNOSIS — M8458XD Pathological fracture in neoplastic disease, other specified site, subsequent encounter for fracture with routine healing: Secondary | ICD-10-CM | POA: Diagnosis not present

## 2019-11-19 DIAGNOSIS — E039 Hypothyroidism, unspecified: Secondary | ICD-10-CM | POA: Diagnosis not present

## 2019-11-19 DIAGNOSIS — C50919 Malignant neoplasm of unspecified site of unspecified female breast: Secondary | ICD-10-CM | POA: Diagnosis not present

## 2019-11-19 DIAGNOSIS — E440 Moderate protein-calorie malnutrition: Secondary | ICD-10-CM | POA: Diagnosis not present

## 2019-11-19 DIAGNOSIS — I10 Essential (primary) hypertension: Secondary | ICD-10-CM | POA: Diagnosis not present

## 2019-11-19 DIAGNOSIS — F419 Anxiety disorder, unspecified: Secondary | ICD-10-CM | POA: Diagnosis not present

## 2019-11-19 DIAGNOSIS — C787 Secondary malignant neoplasm of liver and intrahepatic bile duct: Secondary | ICD-10-CM | POA: Diagnosis not present

## 2019-11-19 DIAGNOSIS — C7951 Secondary malignant neoplasm of bone: Secondary | ICD-10-CM | POA: Diagnosis not present

## 2019-11-19 DIAGNOSIS — Z79891 Long term (current) use of opiate analgesic: Secondary | ICD-10-CM | POA: Diagnosis not present

## 2019-11-19 DIAGNOSIS — M5416 Radiculopathy, lumbar region: Secondary | ICD-10-CM | POA: Diagnosis not present

## 2019-11-19 DIAGNOSIS — Z8673 Personal history of transient ischemic attack (TIA), and cerebral infarction without residual deficits: Secondary | ICD-10-CM | POA: Diagnosis not present

## 2019-11-19 DIAGNOSIS — Z7982 Long term (current) use of aspirin: Secondary | ICD-10-CM | POA: Diagnosis not present

## 2019-11-20 ENCOUNTER — Other Ambulatory Visit: Payer: Medicare Other | Admitting: *Deleted

## 2019-11-20 ENCOUNTER — Telehealth: Payer: Self-pay | Admitting: Internal Medicine

## 2019-11-20 ENCOUNTER — Other Ambulatory Visit: Payer: Self-pay

## 2019-11-20 DIAGNOSIS — Z515 Encounter for palliative care: Secondary | ICD-10-CM

## 2019-11-20 NOTE — Telephone Encounter (Signed)
Verbal orders given to Monishia per Dr Jerilee Hoh.

## 2019-11-20 NOTE — Telephone Encounter (Signed)
[name] would like to have a verbal order for another Hospice evaluation.  You may leave a msg on her secure voicemail.

## 2019-11-22 ENCOUNTER — Emergency Department (HOSPITAL_COMMUNITY): Payer: Medicare Other

## 2019-11-22 ENCOUNTER — Other Ambulatory Visit: Payer: Self-pay

## 2019-11-22 ENCOUNTER — Emergency Department (HOSPITAL_COMMUNITY)
Admission: EM | Admit: 2019-11-22 | Discharge: 2019-11-22 | Disposition: A | Discharge status: Home / Self Care | Payer: Medicare Other | Attending: Emergency Medicine | Admitting: Emergency Medicine

## 2019-11-22 DIAGNOSIS — Z853 Personal history of malignant neoplasm of breast: Secondary | ICD-10-CM | POA: Diagnosis not present

## 2019-11-22 DIAGNOSIS — R1084 Generalized abdominal pain: Secondary | ICD-10-CM | POA: Diagnosis not present

## 2019-11-22 DIAGNOSIS — I5021 Acute systolic (congestive) heart failure: Secondary | ICD-10-CM | POA: Diagnosis not present

## 2019-11-22 DIAGNOSIS — K5730 Diverticulosis of large intestine without perforation or abscess without bleeding: Secondary | ICD-10-CM | POA: Diagnosis not present

## 2019-11-22 DIAGNOSIS — Z79899 Other long term (current) drug therapy: Secondary | ICD-10-CM | POA: Insufficient documentation

## 2019-11-22 DIAGNOSIS — R3914 Feeling of incomplete bladder emptying: Secondary | ICD-10-CM | POA: Insufficient documentation

## 2019-11-22 DIAGNOSIS — Z7982 Long term (current) use of aspirin: Secondary | ICD-10-CM | POA: Insufficient documentation

## 2019-11-22 DIAGNOSIS — Z7401 Bed confinement status: Secondary | ICD-10-CM | POA: Diagnosis not present

## 2019-11-22 DIAGNOSIS — Z923 Personal history of irradiation: Secondary | ICD-10-CM | POA: Diagnosis not present

## 2019-11-22 DIAGNOSIS — C787 Secondary malignant neoplasm of liver and intrahepatic bile duct: Secondary | ICD-10-CM | POA: Diagnosis not present

## 2019-11-22 DIAGNOSIS — Z87891 Personal history of nicotine dependence: Secondary | ICD-10-CM | POA: Diagnosis not present

## 2019-11-22 DIAGNOSIS — R1030 Lower abdominal pain, unspecified: Secondary | ICD-10-CM | POA: Insufficient documentation

## 2019-11-22 DIAGNOSIS — E039 Hypothyroidism, unspecified: Secondary | ICD-10-CM | POA: Diagnosis not present

## 2019-11-22 DIAGNOSIS — C50411 Malignant neoplasm of upper-outer quadrant of right female breast: Secondary | ICD-10-CM | POA: Diagnosis not present

## 2019-11-22 DIAGNOSIS — K5900 Constipation, unspecified: Secondary | ICD-10-CM | POA: Insufficient documentation

## 2019-11-22 DIAGNOSIS — R52 Pain, unspecified: Secondary | ICD-10-CM | POA: Diagnosis not present

## 2019-11-22 DIAGNOSIS — R000 Tachycardia, unspecified: Secondary | ICD-10-CM | POA: Insufficient documentation

## 2019-11-22 DIAGNOSIS — C7951 Secondary malignant neoplasm of bone: Secondary | ICD-10-CM | POA: Diagnosis not present

## 2019-11-22 DIAGNOSIS — I110 Hypertensive heart disease with heart failure: Secondary | ICD-10-CM | POA: Insufficient documentation

## 2019-11-22 DIAGNOSIS — R410 Disorientation, unspecified: Secondary | ICD-10-CM | POA: Diagnosis not present

## 2019-11-22 DIAGNOSIS — Z515 Encounter for palliative care: Secondary | ICD-10-CM

## 2019-11-22 DIAGNOSIS — M2550 Pain in unspecified joint: Secondary | ICD-10-CM | POA: Diagnosis not present

## 2019-11-22 LAB — COMPREHENSIVE METABOLIC PANEL
ALT: 25 U/L (ref 0–44)
AST: 21 U/L (ref 15–41)
Albumin: 3.7 g/dL (ref 3.5–5.0)
Alkaline Phosphatase: 121 U/L (ref 38–126)
Anion gap: 13 (ref 5–15)
BUN: 7 mg/dL — ABNORMAL LOW (ref 8–23)
CO2: 26 mmol/L (ref 22–32)
Calcium: 9.4 mg/dL (ref 8.9–10.3)
Chloride: 98 mmol/L (ref 98–111)
Creatinine, Ser: 0.58 mg/dL (ref 0.44–1.00)
GFR calc Af Amer: >60 mL/min (ref >60)
GFR calc non Af Amer: >60 mL/min (ref >60)
Glucose, Bld: 116 mg/dL — ABNORMAL HIGH (ref 70–99)
Potassium: 3.5 mmol/L (ref 3.5–5.1)
Sodium: 137 mmol/L (ref 135–145)
Total Bilirubin: 1 mg/dL (ref 0.3–1.2)
Total Protein: 7.4 g/dL (ref 6.5–8.1)

## 2019-11-22 LAB — CBC WITH DIFFERENTIAL/PLATELET
Abs Immature Granulocytes: 0.01 10*3/uL (ref 0.00–0.07)
Basophils Absolute: 0 10*3/uL (ref 0.0–0.1)
Basophils Relative: 0 %
Eosinophils Absolute: 0.1 10*3/uL (ref 0.0–0.5)
Eosinophils Relative: 2 %
HCT: 40.5 % (ref 36.0–46.0)
Hemoglobin: 13.3 g/dL (ref 12.0–15.0)
Immature Granulocytes: 0 %
Lymphocytes Relative: 14 %
Lymphs Abs: 0.8 10*3/uL (ref 0.7–4.0)
MCH: 32.7 pg (ref 26.0–34.0)
MCHC: 32.8 g/dL (ref 30.0–36.0)
MCV: 99.5 fL (ref 80.0–100.0)
Monocytes Absolute: 0.4 10*3/uL (ref 0.1–1.0)
Monocytes Relative: 8 %
Neutro Abs: 4 10*3/uL (ref 1.7–7.7)
Neutrophils Relative %: 76 %
Platelets: 197 10*3/uL (ref 150–400)
RBC: 4.07 MIL/uL (ref 3.87–5.11)
RDW: 13.2 % (ref 11.5–15.5)
WBC: 5.3 10*3/uL (ref 4.0–10.5)
nRBC: 0 % (ref 0.0–0.2)

## 2019-11-22 LAB — URINALYSIS, ROUTINE W REFLEX MICROSCOPIC
Bilirubin Urine: NEGATIVE
Glucose, UA: NEGATIVE mg/dL
Hgb urine dipstick: NEGATIVE
Ketones, ur: NEGATIVE mg/dL
Leukocytes,Ua: NEGATIVE
Nitrite: NEGATIVE
Protein, ur: NEGATIVE mg/dL
Specific Gravity, Urine: 1.004 — ABNORMAL LOW (ref 1.005–1.030)
pH: 7 (ref 5.0–8.0)

## 2019-11-22 MED ORDER — FENTANYL CITRATE (PF) 100 MCG/2ML IJ SOLN
50.0000 ug | INTRAMUSCULAR | Status: DC | PRN
  Administered 2019-11-22: 50 ug via INTRAVENOUS
  Filled 2019-11-22: qty 2

## 2019-11-22 MED ORDER — PHENAZOPYRIDINE HCL 100 MG PO TABS
100.0000 mg | ORAL_TABLET | Freq: Three times a day (TID) | ORAL | 0 refills | Status: AC | PRN
Start: 2019-11-22 — End: ?

## 2019-11-22 MED ORDER — FENTANYL CITRATE (PF) 100 MCG/2ML IJ SOLN
50.0000 ug | Freq: Once | INTRAMUSCULAR | Status: AC
  Administered 2019-11-22: 50 ug via INTRAVENOUS
  Filled 2019-11-22: qty 2

## 2019-11-22 MED ORDER — IOHEXOL 9 MG/ML PO SOLN
ORAL | Status: AC
  Filled 2019-11-22: qty 1000

## 2019-11-22 MED ORDER — MORPHINE SULFATE (PF) 4 MG/ML IV SOLN
4.0000 mg | Freq: Once | INTRAVENOUS | Status: AC
  Administered 2019-11-22: 4 mg via INTRAVENOUS
  Filled 2019-11-22: qty 1

## 2019-11-22 MED ORDER — SODIUM CHLORIDE 0.9 % IV BOLUS
500.0000 mL | Freq: Once | INTRAVENOUS | Status: AC
  Administered 2019-11-22: 500 mL via INTRAVENOUS

## 2019-11-22 MED ORDER — PHENAZOPYRIDINE HCL 100 MG PO TABS
100.0000 mg | ORAL_TABLET | Freq: Once | ORAL | Status: AC
  Administered 2019-11-22: 100 mg via ORAL
  Filled 2019-11-22: qty 1

## 2019-11-22 MED ORDER — CARVEDILOL 12.5 MG PO TABS
12.5000 mg | ORAL_TABLET | Freq: Once | ORAL | Status: AC
  Administered 2019-11-22: 12.5 mg via ORAL
  Filled 2019-11-22: qty 1

## 2019-11-22 MED ORDER — IOHEXOL 300 MG/ML  SOLN
80.0000 mL | Freq: Once | INTRAMUSCULAR | Status: AC | PRN
  Administered 2019-11-22: 80 mL via INTRAVENOUS

## 2019-11-22 MED ORDER — SODIUM CHLORIDE (PF) 0.9 % IJ SOLN
INTRAMUSCULAR | Status: AC
  Filled 2019-11-22: qty 50

## 2019-11-22 NOTE — Discharge Instructions (Addendum)
The Hospice team is planning to come out at 10 AM tomorrow morning to admit you to the home hospice service  You can continue using your home pain medicines as well as the prescribed Pyridium to help with bladder spasms and pain

## 2019-11-22 NOTE — ED Notes (Signed)
Pt was assisted by the tech, with the steady to go to the bathroom due to urine retention. Nurse aware.

## 2019-11-22 NOTE — Progress Notes (Signed)
Manufacturing engineer [facility]) hospital liaison note  This RN made aware by Benedetto Goad, ED PA, that [facility] palliative pt was at [facility].  This RN met with pt at bedside to address symptoms and to discuss goals of care.  Pt verbalized wish for comfort care, shared concern that her two sons Delfino Lovett and Shanon Brow be included in the decision making process.  Per Merleen Nicely, EDP, both sons in agreement.  In chart review a scheduled admission visit for 10am, Monday, 11/23/19, was already scheduled for hospice admission to [facility].  Pt reports forgetting this visit had been made.  This RN spoke by phone with Delfino Lovett, her son who lives in [place], who verbalized knowledge of this appointment and intent to be present for admission visit.    Thank you for the opportunity to participate in this patient's care.  Domenic Moras, BSN, RN [facility] Liaison [phone]  [phone] (24h on call)

## 2019-11-22 NOTE — ED Notes (Signed)
Patient soap suds enema done. Patient did not retain it well.

## 2019-11-22 NOTE — ED Notes (Signed)
Hospice nurse at bedside.

## 2019-11-22 NOTE — ED Provider Notes (Signed)
[facility] DEPT Provider Note   CSN: [id number] Arrival date & time: 11/22/19  4401     History Chief Complaint  Patient presents with  . Abdominal Pain    Erica Young is a 79 y.o. female.  Patient to ED with suprapubic abdominal pain that started 1-2 days ago. It is constant pain without aggravating or alleviating factors. She reports having difficulty with constipation requiring suppository 2 days ago that produced a small, non-melanic stool. She also reports she has had increasing difficulty emptying her urinary bladder. She feels her bladder is full. No nausea, vomiting, fever. No history of urinary retention. 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 She states she received her last radiation treatment several weeks ago.   The history is provided by the patient. No language interpreter was used.  Abdominal Pain Associated symptoms: constipation   Associated symptoms: no chest pain, no chills, no dysuria, no fever, no hematuria, no nausea, no shortness of breath and no vomiting        Past Medical History:  Diagnosis Date  . Allergy   . Anxiety   . 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 Cancer [facility]) 2007   right breat ca  , lumpectomy and radiation tx (declined chemo and additional prophylactic meds)  . Cataract    both eyes  10/2016 Lt.     04/2017 Rt.   . CEREBROVASCULAR DISEASE 03/03/2010  . Diverticulitis   . DIVERTICULOSIS, COLON 03/03/2010  . Fatty liver 08/02/09   as per U/S done by [facility]  . Foramen ovale    positive bubble study  . GERD (gastroesophageal reflux disease)   . Headache(784.0)    she thinks sinus headaches  . History of cerebral artery stenosis    right middle  . HYPERLIPIDEMIA 03/03/2010  . Hypertension   . HYPOTHYROIDISM 03/03/2010   no longer on meds  . Internal hemorrhoid   . Low back pain   . Mass of sinus   . Mastoiditis      noted on brain MRI  . Rib fracture   . Right leg pain   . Stroke [facility]) Oct. 2011   TIA  . Ulcer     Patient Active Problem List   Diagnosis Date Noted  . Malnutrition of moderate degree 09/05/2019  . Vertebral compression fracture ([place]) 09/03/2019  . Hypoxia 09/03/2019  . Compression fracture of L1 lumbar vertebra ([place]) 09/03/2019  . Liver metastasis ([place]) 07/27/2019  . Goals of care, counseling/discussion 05/12/2019  . Syncope   . Acute systolic (congestive) heart failure ([place])   . Shortness of breath   . Acute left-sided weakness 01/29/2019  . Leukopenia due to antineoplastic chemotherapy ([place]) 01/29/2019  . Anxiety 01/29/2019  . Aortic atherosclerosis ([place]) 01/03/2018  . History of epistaxis 10/15/2017  . GI symptoms/possible food intolerance 06/18/2017  . Malignant neoplasm of upper-outer quadrant of right breast in female, estrogen receptor positive ([place]) 01/19/2016  . Bone metastases ([place]) 11/22/2014  . Perennial allergic rhinitis with a nonallergic component 02/12/2014  . Metastatic breast cancer ([place]) 01/23/2014  . History of breast cancer 01/11/2014  . Chronic rhinitis 09/17/2013  . Hilar adenopathy 05/12/2013  . Abdominal pain 05/07/2011  . HTN (hypertension) 06/09/2010  . Backache 04/10/2010  . Dyslipidemia 03/03/2010  . Cerebrovascular disease 03/03/2010  . DIVERTICULOSIS, COLON 03/03/2010    Past Surgical History:  Procedure Laterality Date  . BREAST  LUMPECTOMY     right  . CATARACT EXTRACTION Left    10/2016   Rt 04/2017  . CHOLECYSTECTOMY    . COLONOSCOPY    . IR BONE TUMOR(S)RF ABLATION  09/11/2019  . IR BONE TUMOR(S)RF ABLATION  09/11/2019  . IR KYPHO EA ADDL LEVEL THORACIC OR LUMBAR  09/11/2019  . IR KYPHO LUMBAR INC FX REDUCE BONE BX UNI/BIL CANNULATION INC/IMAGING  09/11/2019  . POLYPECTOMY     benign cecum  . SHOULDER SURGERY     right shoulder (dislocation)  . TONSILLECTOMY    . TUBAL LIGATION    . UPPER GASTROINTESTINAL ENDOSCOPY    .  VIDEO BRONCHOSCOPY WITH ENDOBRONCHIAL ULTRASOUND N/A 01/07/2014   Procedure: VIDEO BRONCHOSCOPY WITH ENDOBRONCHIAL ULTRASOUND;  Surgeon: Ivin Poot, MD;  Location: [facility] OR;  Service: Thoracic;  Laterality: N/A;     OB History    Gravida  4   Para  2   Term      Preterm      AB      Living        SAB      TAB      Ectopic      Multiple      Live Births              Family History  Problem Relation Age of Onset  . Heart disease Sister        cerebral vascular disease also  . Heart disease Brother        cerebral vascular disease also  . Heart disease Brother        cerebral vascular disease  . Heart disease Sister        cebreal vascular disease also  . Heart disease Sister        cebreal vascular diease also  . Heart disease Sister        cerebral vascular diease also  . Stomach cancer Father   . Colon cancer Neg Hx   . Esophageal cancer Neg Hx   . Rectal cancer Neg Hx   . Colon polyps Neg Hx   . Asthma Neg Hx     Social History   Tobacco Use  . Smoking status: Former Smoker    Packs/day: 0.50    Years: 20.00    Pack years: 10.00    Types: Cigarettes    Start date: 48    Quit date: 05/01/1991    Years since quitting: 28.5  . Smokeless tobacco: Never Used  Vaping Use  . Vaping Use: Never used  Substance Use Topics  . Alcohol use: No    Alcohol/week: 0.0 standard drinks  . Drug use: No    Home Medications Prior to Admission medications   Medication Sig Start Date End Date Taking? Authorizing Provider  ALPRAZolam (XANAX) 0.25 MG tablet Take 1 tablet (0.25 mg total) by mouth 3 (three) times daily as needed. for anxiety 11/10/19   Isaac Bliss, Rayford Halsted, MD  aluminum-magnesium hydroxide 200-200 MG/5ML suspension Take by mouth every 6 (six) hours as needed for indigestion.    [provider]  aspirin EC 81 MG tablet Take 81 mg by mouth daily after breakfast.     [provider]  carvedilol (COREG) 12.5 MG tablet Take 1  tablet (12.5 mg total) by mouth 2 (two) times daily with a meal. 06/05/19   Isaac Bliss, Rayford Halsted, MD  Cholecalciferol (VITAMIN D PO) Take 1,000 Units by mouth daily.  [provider]  cyclobenzaprine (FLEXERIL) 5 MG tablet Take 1 tablet (5 mg total) by mouth 3 (three) times daily. 09/13/19   Florencia Reasons, MD  diclofenac Sodium (VOLTAREN) 1 % GEL Apply 2 g topically 4 (four) times daily. To affected area 09/13/19   Florencia Reasons, MD  dronabinol (MARINOL) 2.5 MG capsule Take 1 capsule (2.5 mg total) by mouth daily before supper. 09/13/19   Florencia Reasons, MD  exemestane (AROMASIN) 25 MG tablet Take 1 tablet (25 mg total) by mouth daily after breakfast. 07/27/19   Magrinat, Virgie Dad, MD  feeding supplement, ENSURE ENLIVE, (ENSURE ENLIVE) LIQD Take 237 mLs by mouth 3 (three) times daily between meals. 09/13/19   Florencia Reasons, MD  gabapentin (NEURONTIN) 100 MG capsule Take 1 capsule (100 mg total) by mouth 3 (three) times daily. 11/04/19 12/04/19  Isaac Bliss, Rayford Halsted, MD  Glucosamine 500 MG CAPS Take 500 mg by mouth in the morning and at bedtime.     [provider]  HYDROcodone-acetaminophen (NORCO/VICODIN) 5-325 MG tablet Take 1 tablet by mouth every 6 (six) hours as needed. 11/10/19   Isaac Bliss, Rayford Halsted, MD  ibuprofen (ADVIL) 400 MG tablet Take 1 tablet (400 mg total) by mouth every 6 (six) hours as needed for moderate pain. 09/13/19   Florencia Reasons, MD  loratadine (CLARITIN) 10 MG tablet Take 1 tablet (10 mg total) by mouth daily. 04/16/19   Isaac Bliss, Rayford Halsted, MD  losartan (COZAAR) 25 MG tablet Take 1 tablet (25 mg total) by mouth daily. 09/14/19   Florencia Reasons, MD  morphine (MS CONTIN) 15 MG 12 hr tablet Take 1 tablet (15 mg total) by mouth every 12 (twelve) hours. 11/11/19   Isaac Bliss, Rayford Halsted, MD  Multiple Vitamin (MULTIVITAMIN WITH MINERALS) TABS tablet Take 1 tablet by mouth daily. 09/14/19   Florencia Reasons, MD  ondansetron (ZOFRAN-ODT) 4 MG disintegrating tablet Take 1 tablet (4 mg  total) by mouth every 8 (eight) hours as needed for nausea or vomiting. 08/18/19   Davonna Belling, MD  pantoprazole (PROTONIX) 40 MG tablet Take 1 tablet (40 mg total) by mouth daily. 09/14/19   Florencia Reasons, MD  polyethylene glycol (MIRALAX / GLYCOLAX) 17 g packet Take 17 g by mouth 2 (two) times daily. 09/13/19   Florencia Reasons, MD  senna-docusate (SENOKOT-S) 8.6-50 MG tablet Take 2 tablets by mouth 2 (two) times daily. Hold if diarrhea 09/13/19   Florencia Reasons, MD    Allergies    Ciprofloxacin  Review of Systems   Review of Systems  Constitutional: Negative for chills and fever.  HENT: Negative.   Respiratory: Negative.  Negative for shortness of breath.   Cardiovascular: Negative.  Negative for chest pain.  Gastrointestinal: Positive for abdominal pain and constipation. Negative for nausea and vomiting.  Genitourinary: Positive for difficulty urinating. Negative for dysuria and hematuria.  Musculoskeletal: Positive for back pain (chronic, bony mets).  Skin: Negative.   Neurological: Negative.  Negative for weakness and light-headedness.    Physical Exam Updated Vital Signs BP (!) 149/97 (BP Location: Right Arm)   Pulse 99   Temp 97.7 F (36.5 C) (Oral)   Resp 16   Ht _0  (1.549 m)   Wt 48.1 kg   SpO2 92%   BMI 20.03 kg/m   Physical Exam Vitals and nursing note reviewed.  Constitutional:      Appearance: She is well-developed.  HENT:     Head: Normocephalic.  Cardiovascular:     Rate and  Rhythm: Normal rate and regular rhythm.  Pulmonary:     Effort: Pulmonary effort is normal.     Breath sounds: Normal breath sounds.  Abdominal:     General: Bowel sounds are normal.     Palpations: Abdomen is soft.     Tenderness: There is abdominal tenderness in the suprapubic area. There is no guarding or rebound.  Genitourinary:    Rectum: Normal.     Comments: No fecal impaction. Musculoskeletal:        General: Normal range of motion.     Cervical back: Normal range of motion and  neck supple.  Skin:    General: Skin is warm and dry.     Findings: No rash.  Neurological:     Mental Status: She is alert.     Cranial Nerves: No cranial nerve deficit.     ED Results / Procedures / Treatments   Labs (all labs ordered are listed, but only abnormal results are displayed) Labs Reviewed  URINE CULTURE  CBC WITH DIFFERENTIAL/PLATELET  COMPREHENSIVE METABOLIC PANEL  URINALYSIS, ROUTINE W REFLEX MICROSCOPIC   Results for orders placed or performed during the hospital encounter of 09/02/19  Respiratory Panel by RT PCR (Flu A&B, Covid) - Nasopharyngeal Swab   Specimen: Nasopharyngeal Swab  Result Value Ref Range   SARS Coronavirus 2 by RT PCR NEGATIVE NEGATIVE   Influenza A by PCR NEGATIVE NEGATIVE   Influenza B by PCR NEGATIVE NEGATIVE  Urine Culture   Specimen: Urine, Clean Catch  Result Value Ref Range   Specimen Description      URINE, CLEAN CATCH Performed at [facility] [address] [place], [place] [address]    Special Requests      NONE Performed at [facility], [place] [address] [place], [place] [address]    Culture MULTIPLE SPECIES PRESENT, SUGGEST RECOLLECTION (A)    Report Status 09/10/2019 FINAL   SARS CORONAVIRUS 2 (TAT 6-24 HRS) Nasopharyngeal Nasopharyngeal Swab   Specimen: Nasopharyngeal Swab  Result Value Ref Range   SARS Coronavirus 2 NEGATIVE NEGATIVE  CBC with Differential/Platelet  Result Value Ref Range   WBC 4.1 4.0 - 10.5 K/uL   RBC 3.73 (L) 3.87 - 5.11 MIL/uL   Hemoglobin 11.7 (L) 12.0 - 15.0 g/dL   HCT 35.6 (L) 36 - 46 %   MCV 95.4 80.0 - 100.0 fL   MCH 31.4 26.0 - 34.0 pg   MCHC 32.9 30.0 - 36.0 g/dL   RDW 14.0 11.5 - 15.5 %   Platelets 160 150 - 400 K/uL   nRBC 0.0 0.0 - 0.2 %   Neutrophils Relative % 66 %   Neutro Abs 2.8 1.7 - 7.7 K/uL   Lymphocytes Relative 11 %   Lymphs Abs 0.4 (L) 0.7 - 4.0 K/uL   Monocytes Relative 10 %   Monocytes Absolute 0.4 0 - 1 K/uL   Eosinophils  Relative 12 %   Eosinophils Absolute 0.5 0 - 0 K/uL   Basophils Relative 1 %   Basophils Absolute 0.0 0 - 0 K/uL   Immature Granulocytes 0 %   Abs Immature Granulocytes 0.01 0.00 - 0.07 K/uL  Urinalysis, Routine w reflex microscopic  Result Value Ref Range   Color, Urine YELLOW YELLOW   APPearance CLEAR CLEAR   Specific Gravity, Urine 1.012 1.005 - 1.030   pH 6.0 5.0 - 8.0   Glucose, UA NEGATIVE NEGATIVE mg/dL   Hgb urine dipstick SMALL (A) NEGATIVE   Bilirubin  Urine NEGATIVE NEGATIVE   Ketones, ur 5 (A) NEGATIVE mg/dL   Protein, ur NEGATIVE NEGATIVE mg/dL   Nitrite NEGATIVE NEGATIVE   Leukocytes,Ua NEGATIVE NEGATIVE   RBC / HPF 6-10 0 - 5 RBC/hpf   WBC, UA 0-5 0 - 5 WBC/hpf   Bacteria, UA RARE (A) NONE SEEN   Squamous Epithelial / LPF 0-5 0 - 5   Mucus PRESENT    Hyaline Casts, UA PRESENT   Magnesium  Result Value Ref Range   Magnesium 1.9 1.7 - 2.4 mg/dL  Basic metabolic panel  Result Value Ref Range   Sodium 133 (L) 135 - 145 mmol/L   Potassium 4.3 3.5 - 5.1 mmol/L   Chloride 104 98 - 111 mmol/L   CO2 24 22 - 32 mmol/L   Glucose, Bld 117 (H) 70 - 99 mg/dL   BUN 12 8 - 23 mg/dL   Creatinine, Ser 0.59 0.44 - 1.00 mg/dL   Calcium 8.4 (L) 8.9 - 10.3 mg/dL   GFR calc non Af Amer >60 >60 mL/min   GFR calc Af Amer >60 >60 mL/min   Anion gap 5 5 - 15  Basic metabolic panel  Result Value Ref Range   Sodium 135 135 - 145 mmol/L   Potassium 4.1 3.5 - 5.1 mmol/L   Chloride 102 98 - 111 mmol/L   CO2 26 22 - 32 mmol/L   Glucose, Bld 103 (H) 70 - 99 mg/dL   BUN 10 8 - 23 mg/dL   Creatinine, Ser 0.52 0.44 - 1.00 mg/dL   Calcium 8.1 (L) 8.9 - 10.3 mg/dL   GFR calc non Af Amer >60 >60 mL/min   GFR calc Af Amer >60 >60 mL/min   Anion gap 7 5 - 15  Protime-INR  Result Value Ref Range   Prothrombin Time 12.4 11.4 - 15.2 seconds   INR 1.0 0.8 - 1.2  Basic metabolic panel  Result Value Ref Range   Sodium 135 135 - 145 mmol/L   Potassium 4.3 3.5 - 5.1 mmol/L   Chloride 99 98  - 111 mmol/L   CO2 28 22 - 32 mmol/L   Glucose, Bld 114 (H) 70 - 99 mg/dL   BUN 22 8 - 23 mg/dL   Creatinine, Ser 0.57 0.44 - 1.00 mg/dL   Calcium 8.7 (L) 8.9 - 10.3 mg/dL   GFR calc non Af Amer >60 >60 mL/min   GFR calc Af Amer >60 >60 mL/min   Anion gap 8 5 - 15  CBC  Result Value Ref Range   WBC 4.3 4.0 - 10.5 K/uL   RBC 3.62 (L) 3.87 - 5.11 MIL/uL   Hemoglobin 11.4 (L) 12.0 - 15.0 g/dL   HCT 35.1 (L) 36 - 46 %   MCV 97.0 80.0 - 100.0 fL   MCH 31.5 26.0 - 34.0 pg   MCHC 32.5 30.0 - 36.0 g/dL   RDW 14.7 11.5 - 15.5 %   Platelets 149 (L) 150 - 400 K/uL   nRBC 0.0 0.0 - 0.2 %  Magnesium  Result Value Ref Range   Magnesium 2.0 1.7 - 2.4 mg/dL  Basic metabolic panel  Result Value Ref Range   Sodium 132 (L) 135 - 145 mmol/L   Potassium 4.1 3.5 - 5.1 mmol/L   Chloride 96 (L) 98 - 111 mmol/L   CO2 28 22 - 32 mmol/L   Glucose, Bld 102 (H) 70 - 99 mg/dL   BUN 18 8 - 23 mg/dL   Creatinine, Ser  0.61 0.44 - 1.00 mg/dL   Calcium 8.7 (L) 8.9 - 10.3 mg/dL   GFR calc non Af Amer >60 >60 mL/min   GFR calc Af Amer >60 >60 mL/min   Anion gap 8 5 - 15  CBC  Result Value Ref Range   WBC 3.2 (L) 4.0 - 10.5 K/uL   RBC 3.30 (L) 3.87 - 5.11 MIL/uL   Hemoglobin 10.5 (L) 12.0 - 15.0 g/dL   HCT 32.7 (L) 36 - 46 %   MCV 99.1 80.0 - 100.0 fL   MCH 31.8 26.0 - 34.0 pg   MCHC 32.1 30.0 - 36.0 g/dL   RDW 14.7 11.5 - 15.5 %   Platelets 154 150 - 400 K/uL   nRBC 0.0 0.0 - 0.2 %  Magnesium  Result Value Ref Range   Magnesium 2.0 1.7 - 2.4 mg/dL  Basic metabolic panel  Result Value Ref Range   Sodium 131 (L) 135 - 145 mmol/L   Potassium 3.7 3.5 - 5.1 mmol/L   Chloride 97 (L) 98 - 111 mmol/L   CO2 26 22 - 32 mmol/L   Glucose, Bld 95 70 - 99 mg/dL   BUN 15 8 - 23 mg/dL   Creatinine, Ser 0.50 0.44 - 1.00 mg/dL   Calcium 8.4 (L) 8.9 - 10.3 mg/dL   GFR calc non Af Amer >60 >60 mL/min   GFR calc Af Amer >60 >60 mL/min   Anion gap 8 5 - 15  CBC  Result Value Ref Range   WBC 3.6 (L) 4.0 -  10.5 K/uL   RBC 3.29 (L) 3.87 - 5.11 MIL/uL   Hemoglobin 10.4 (L) 12.0 - 15.0 g/dL   HCT 32.4 (L) 36 - 46 %   MCV 98.5 80.0 - 100.0 fL   MCH 31.6 26.0 - 34.0 pg   MCHC 32.1 30.0 - 36.0 g/dL   RDW 14.6 11.5 - 15.5 %   Platelets 154 150 - 400 K/uL   nRBC 0.0 0.0 - 0.2 %  Magnesium  Result Value Ref Range   Magnesium 1.9 1.7 - 2.4 mg/dL  Basic metabolic panel  Result Value Ref Range   Sodium 134 (L) 135 - 145 mmol/L   Potassium 3.5 3.5 - 5.1 mmol/L   Chloride 98 98 - 111 mmol/L   CO2 28 22 - 32 mmol/L   Glucose, Bld 105 (H) 70 - 99 mg/dL   BUN 11 8 - 23 mg/dL   Creatinine, Ser 0.46 0.44 - 1.00 mg/dL   Calcium 8.4 (L) 8.9 - 10.3 mg/dL   GFR calc non Af Amer >60 >60 mL/min   GFR calc Af Amer >60 >60 mL/min   Anion gap 8 5 - 15  Magnesium  Result Value Ref Range   Magnesium 1.9 1.7 - 2.4 mg/dL  I-stat chem 8, ED (not at [facility] or [facility])  Result Value Ref Range   Sodium 135 135 - 145 mmol/L   Potassium 3.4 (L) 3.5 - 5.1 mmol/L   Chloride 97 (L) 98 - 111 mmol/L   BUN 8 8 - 23 mg/dL   Creatinine, Ser 0.40 (L) 0.44 - 1.00 mg/dL   Glucose, Bld 108 (H) 70 - 99 mg/dL   Calcium, Ion 1.15 1.15 - 1.40 mmol/L   TCO2 26 22 - 32 mmol/L   Hemoglobin 11.9 (L) 12.0 - 15.0 g/dL   HCT 35.0 (L) 36 - 46 %  Surgical pathology  Result Value Ref Range   SURGICAL PATHOLOGY  SURGICAL PATHOLOGY ** THIS IS AN ADDENDUM REPORT ** CASE: [id number] PATIENT: [facility] Surgical Pathology Report   Reason for Addendum #1:  Breast Biomarker Results  Clinical History: History of metastatic breast cancer, now with malignant compression fracture of L1 and L5     FINAL MICROSCOPIC DIAGNOSIS:  A. BONE, L1, NEEDLE CORE BIOPSY: - Scant red blood cells. - No tissue present for evaluation.  B. BONE, L5, NEEDLE CORE BIOPSY: - Mammary carcinoma.  COMMENT:  B.  Immunohistochemistry for CK7, GATA-3 and ER is positive.  CK7, TTF-1 and CDX-2 are negative.  The immunophenotype is compatible with  the provided clinical history of mammary carcinoma.  A breast prognostic profile will be reported separately.  GROSS DESCRIPTION:  A: The specimen consists of blood-tinged formalin.  Tissue is not grossly identified.  The specimen is processed as a cell block.  B: The specimen consists of a 1.4 x 0.2 cm core of tan  bone and a 1.0 x 0.6 x 0.2 cm aggregate of clotted blood.  The specimen is submitted in toto following decalcification.  [facility] 09/14/2019)   Final Diagnosis performed by Gillie Manners, MD.   Electronically signed 09/16/2019 Technical component performed at [place] [address] [place], [place] [address].  Professional component performed at [employer]. [facility], [place] [address], [place], [place] [address].  Immunohistochemistry Technical component (if applicable) was performed at [facility]. [address], [place], [place], [place] [address].   IMMUNOHISTOCHEMISTRY DISCLAIMER (if applicable): Some of these immunohistochemical stains may have been developed and the performance characteristics determine by [facility]. Some may not have been cleared or approved by the U.S. Food and Drug Administration. The FDA has determined that such clearance or approval is not necessary. This test is used for [employer] purposes. It should not be regarded as investigational or for research. This laboratory is certified under the [place] (CLIA-88) as qualified to perform high complexity clinical laboratory testing.  The controls stained appropriately.  ADDENDUM:  PROGNOSTIC INDICATOR RESULTS:  Immunohistochemical and morphometric analysis performed manually  The tumor cells are NEGATIVE for Her2 (1+).  Estrogen Receptor:       POSITIVE, 90%, MODERATE STAINING Progesterone Receptor:   NEGATIVE Proliferation Marker Ki-67:   5%  Comment: The negative hormone receptor  study(ies) in the case has an internal positive control.  Reference Range Estrogen and Progesterone Receptor      Negative  0%      Positive  >1%  All controls stained appropriately.         Addendum #1 performed by Vicente Males, MD.   Electronically signed 09/17/2019 Technical and / or Professional components performed at [facility], [place] [address], [place], [place] [address].  Immunohistochemistry Technical component (if applicable) was performed at [facility]. [address], [place], [place], [place] [address].   IMMUNOHISTOCHEMISTRY DISCLAIMER (if applicable): Some of these immunohistochemical stains may have been developed and the performance characteristics determine by [facility]. Some may not have been cleared or approved by the U.S. Food and Drug Administration. The FDA has determined that such clearance or approval is not necessary. This test is used for clinical purposes. It should not be regarded as investigational or for research. This laboratory is certified under the [place] (CLIA-88) as qualified to perform high complexity clinical laboratory testing.  The controls stained appropriately.    *  Note: Due to a large number of results and/or encounters for the requested time period, some results have not been displayed. A complete set of results can be found in Results Review.    EKG None  Radiology No results found.  Procedures Procedures (including critical care time)  Medications Ordered in ED Medications - No data to display  ED Course  I have reviewed the triage vital signs and the nursing notes.  Pertinent labs & imaging results that were available during my care of the patient were reviewed by me and considered in my medical decision making (see chart for details).    MDM Rules/Calculators/A&P                          Patient to ED with  suprapubic abdominal pain, recent history of constipation and difficulty urinating. No fever, nausea. H/O breast CA with liver and bone mets.   She is uncomfortable appearing. Foley catheter placed with return of 30 cc clear urine. Patient requesting that it be removed. She does not appear to be retaining urine. Foley removed.   Labs and CT pending in evaluation of abdominal pain. Patient care signed out to [facility], PA-C.   Final Clinical Impression(s) / ED Diagnoses Final diagnoses:  None   1. Abdominal pain  Rx / DC Orders ED Discharge Orders    None       Charlann Lange, PA-C 11/22/19 0648    Mol[place]us, Jenny Reichmann, MD 11/22/19 [phone]

## 2019-11-22 NOTE — ED Triage Notes (Signed)
Pt brought in by EMS for abdominal pain. Pt had suppository in and was able to pass a small amount of stool two days ago, states she is now having difficulty passing urine. Pt is a poor historian. DNR at bedside. Home health currently assisting patient on a daily basis at home.

## 2019-11-22 NOTE — ED Provider Notes (Signed)
Care assumed from [place], please see her note for full details, but in brief  Erica Young is a 79 y.o. 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  Is on multiple pain medications for breast cancer, stopped radiation several weeks ago.  No associated fevers, blood in the stool.  Lab work thus far has been reassuring, Foley catheter placed with a small amount of clear urine, and patient asked for the catheter to be removed, she feels like she has had trouble emptying her bladder primarily because of difficulty with pain.  CT pending at shift change  Plan: Follow-up on CT, suspect constipation, may need enema Physical Exam  BP (!) 151/98   Pulse (!) 106   Temp 97.7 F (36.5 C) (Oral)   Resp 16   Ht 5\' 1"  (1.549 m)   Wt 48.1 kg   SpO2 94%   BMI 20.03 kg/m   Physical Exam Constitutional:      Comments: Patient is alert she appears very anxious and uncomfortable  Cardiovascular:     Rate and Rhythm: Regular rhythm. Tachycardia present.  Abdominal:     Tenderness: There is abdominal tenderness in the suprapubic area.     Comments: Abdomen with suprapubic tenderness and some palpable bladder distention  Skin:    General: Skin is warm and dry.  Neurological:     Mental Status: She is alert.     Comments: Alert, oriented, but frequently forgets and has to be reminded of prior conversations Moving all extremities without difficulty  Psychiatric:        Mood and Affect: Mood is anxious.     ED Course/Procedures   Labs Reviewed  COMPREHENSIVE METABOLIC PANEL - Abnormal; Notable for the following components:      Result Value   Glucose, Bld 116 (*)    BUN 7 (*)    All other  components within normal limits  URINALYSIS, ROUTINE W REFLEX MICROSCOPIC - Abnormal; Notable for the following components:   Specific Gravity, Urine 1.004 (*)    All other components within normal limits  URINE CULTURE  CBC WITH DIFFERENTIAL/PLATELET   CT ABDOMEN PELVIS W CONTRAST  Result Date: 11/22/2019 CLINICAL DATA:  Suprapubic abdominal pain beginning 2 days ago. Constipation. History of breast cancer and known metastatic disease. EXAM: CT ABDOMEN AND PELVIS WITH CONTRAST TECHNIQUE: Multidetector CT imaging of the abdomen and pelvis was performed using the standard protocol following bolus administration of intravenous contrast. CONTRAST:  43mL OMNIPAQUE IOHEXOL 300 MG/ML  SOLN COMPARISON:  Abdominopelvic CT 07/14/2019 and chest CT 02/02/2019 FINDINGS: Lower chest: Pleural based nodularity over the lingula slightly more prominent measuring 1.2 x 2.4 cm. Two subpleural nodular densities over the posterior right lower lobe measuring 8 and 10 mm with interval increase in size. 2 mm subpleural nodule adjacent the minor fissure. No effusion. Evidence of bulky adenopathy over the visualized right hilar region and mediastinum/subcarinal region likely worse compared to the previous study, although incompletely evaluated as only well visualized on the most superior image. Overall findings suggest worsening metastatic disease. Calcified plaque over the descending thoracic aorta. Surgical clips over the lower right axilla. Hepatobiliary: Previous cholecystectomy. Moderate interval enlargement of heterogeneous hypoechoic mass over  the dome of the right lobe of the liver now measuring 6.3 cm (previously 3.1 cm). Findings compatible with worsening metastatic disease. Biliary tree unremarkable. Pancreas: Normal. Spleen: Normal. Adrenals/Urinary Tract: Left adrenal gland is normal. Slight increase in size of known right adrenal metastasis measuring 3.4 cm. Kidneys are normal in size without hydronephrosis. Several  small right renal cysts unchanged. Ureters are normal. Mild bladder distention with single focus of air over the nondependent portion. This air may be due to recent instrumentation and less likely enterovesical fistula or infection. Stomach/Bowel: Stomach and small bowel are unremarkable. Mild to moderate fecal retention throughout the colon. Mild colonic diverticulosis. Appendix is normal. Vascular/Lymphatic: Mild calcified plaque over the abdominal aorta which is normal in caliber. Remaining vascular structures are unremarkable. No significant adenopathy. Reproductive: Normal. Other: No significant free fluid or focal inflammatory change. Subcutaneous edema over the medial inferior left gluteal region. Musculoskeletal: Patchy areas of sclerosis within the spine compatible with known metastatic disease. Evidence of patient's pathologic compression fractures post kyphoplasty involving L1 and L5. Mild mixed lucency and sclerosis over the pelvic bones and proximal femur suggesting metastatic disease. 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 IMPRESSION: 1.  No definite acute findings in the abdomen/pelvis. 2. Evidence of overall progression of patient's metastatic breast cancer as described including mediastinal/right hilar adenopathy, pulmonary nodules, right adrenal and liver metastases. Evidence of known osseous metastatic disease with pathologic L1 and L5 compression fractures post kyphoplasty. 3.  Right renal cysts unchanged. 4. Colonic diverticulosis. Mild fecal retention throughout the colon without obstruction. 5. Mild bladder distention. Single small focus of air over the nondependent portion of the bladder which is likely due to recent instrumentation and less likely can be seen with infection or enterovesical fistula. 6.  Aortic Atherosclerosis (ICD10-I70.0). Electronically Signed   By: Marin Olp M.D.   On: 11/22/2019 09:37     Procedures  MDM   Patient CT scan shows no acute findings within  the abdomen or pelvis, there is progression of patient's metastatic breast cancer including mediastinal and hilar lymphadenopathy, pulmonary nodules and increased size and right adrenal and liver metastasis.  Known osseous metastatic disease including pathologic L1 and L5 fractures unchanged post kyphoplasty.  There is mild to moderate stool retention throughout the colon without evidence of obstruction or fecal impaction.  There is a small focus of air over the bladder likely in the setting of catheterization earlier this evening, but patient requested that Foley catheter be removed due to discomfort.  On my exam patient appears uncomfortable.  She has been able to urinate but only small amounts, and does not seem to be able to fully empty her bladder.  She is extremely anxious and adamantly refuses having I&O catheterization or Foley catheter replaced.  After patient got up to the toilet she was able to urinate more, and reported improving suprapubic discomfort.  Attempted enema for constipation and patient was able to pass a small amount of stool but did not have good retention of the enema.  Long discussion with patient, she is distraught and does not know what she wants her goals to be initially, but then endorses interest in hospice.  Patient has already had home consultation with palliative care.  I discussed this plan with both of her sons Delfino Lovett and Shanon Brow and discussed the progressed metastatic disease.  They are in agreement with plan for hospice consultation.  I called and spoke with the on-call hospice liaison RN Chrislyn Edison Pace who has seen patient in  the hospital multiple times previously.  She will come and evaluate patient in the emergency department.  Hospice RN at bedside, she agrees patient would benefit from hospice services and more help in interventions at home to help her remain comfortable.  She notes that patient actually has a hospice intake visit scheduled at her home for 10 AM  tomorrow, but the patient states she remembered talking to her palliative nurse about this but did not remember having this appointment scheduled.  Patient states she has been very forgetful lately with the metastatic brain mass and pain medications.  Patient was provided with reassurance and is in agreement with plan for hospice admission appointment tomorrow morning at home.  She is much more comfortable now, has had some intermittent bladder spasms.  Will order Pyridium as needed as well as.  Pain medications and will plan for discharge with transport home with [facility] ambulance service.  I called and discussed this plan with her son Delfino Lovett who is in agreement and he is now aware of hospice appointment tomorrow morning.       Jacqlyn Larsen, PA-C 11/22/19 1624    Daleen Bo, MD 11/23/19 [phone]

## 2019-11-22 NOTE — ED Notes (Signed)
[facility] notified to transport patient home.

## 2019-11-22 NOTE — ED Provider Notes (Signed)
  Face-to-face evaluation   History: She presents for evaluation of, and difficulty walking.  She has history of metastatic breast cancer with metastases to the spine, and lives alone, currently.  Patient relates to me that she feels her cancer is severe and she wants to be placed into "hospice."  Physical exam: Alert elderly female.  She is uncomfortable.  Abdomen is mildly tender.  Back is  tender in the lumbar region.  Patient is lucid.  No dysarthria or aphasia.  Medical screening examination/treatment/procedure(s) were conducted as a shared visit with non-physician practitioner(s) and myself.  I personally evaluated the patient during the encounter    Daleen Bo, MD 11/23/19 1753

## 2019-11-23 DIAGNOSIS — C7800 Secondary malignant neoplasm of unspecified lung: Secondary | ICD-10-CM | POA: Diagnosis not present

## 2019-11-23 DIAGNOSIS — C50411 Malignant neoplasm of upper-outer quadrant of right female breast: Secondary | ICD-10-CM | POA: Diagnosis not present

## 2019-11-23 DIAGNOSIS — C787 Secondary malignant neoplasm of liver and intrahepatic bile duct: Secondary | ICD-10-CM | POA: Diagnosis not present

## 2019-11-23 DIAGNOSIS — C7970 Secondary malignant neoplasm of unspecified adrenal gland: Secondary | ICD-10-CM | POA: Diagnosis not present

## 2019-11-23 DIAGNOSIS — I10 Essential (primary) hypertension: Secondary | ICD-10-CM | POA: Diagnosis not present

## 2019-11-23 DIAGNOSIS — K219 Gastro-esophageal reflux disease without esophagitis: Secondary | ICD-10-CM | POA: Diagnosis not present

## 2019-11-23 DIAGNOSIS — I69318 Other symptoms and signs involving cognitive functions following cerebral infarction: Secondary | ICD-10-CM | POA: Diagnosis not present

## 2019-11-23 DIAGNOSIS — Z6820 Body mass index (BMI) 20.0-20.9, adult: Secondary | ICD-10-CM | POA: Diagnosis not present

## 2019-11-23 DIAGNOSIS — E785 Hyperlipidemia, unspecified: Secondary | ICD-10-CM | POA: Diagnosis not present

## 2019-11-23 DIAGNOSIS — C7951 Secondary malignant neoplasm of bone: Secondary | ICD-10-CM | POA: Diagnosis not present

## 2019-11-23 LAB — URINE CULTURE: Culture: NO GROWTH

## 2019-11-24 DIAGNOSIS — C7800 Secondary malignant neoplasm of unspecified lung: Secondary | ICD-10-CM | POA: Diagnosis not present

## 2019-11-24 DIAGNOSIS — I69318 Other symptoms and signs involving cognitive functions following cerebral infarction: Secondary | ICD-10-CM | POA: Diagnosis not present

## 2019-11-24 DIAGNOSIS — C50411 Malignant neoplasm of upper-outer quadrant of right female breast: Secondary | ICD-10-CM | POA: Diagnosis not present

## 2019-11-24 DIAGNOSIS — C787 Secondary malignant neoplasm of liver and intrahepatic bile duct: Secondary | ICD-10-CM | POA: Diagnosis not present

## 2019-11-24 DIAGNOSIS — C7951 Secondary malignant neoplasm of bone: Secondary | ICD-10-CM | POA: Diagnosis not present

## 2019-11-24 DIAGNOSIS — C7970 Secondary malignant neoplasm of unspecified adrenal gland: Secondary | ICD-10-CM | POA: Diagnosis not present

## 2019-11-26 ENCOUNTER — Ambulatory Visit: Payer: Self-pay | Admitting: *Deleted

## 2019-11-26 DIAGNOSIS — I69318 Other symptoms and signs involving cognitive functions following cerebral infarction: Secondary | ICD-10-CM | POA: Diagnosis not present

## 2019-11-26 DIAGNOSIS — C7970 Secondary malignant neoplasm of unspecified adrenal gland: Secondary | ICD-10-CM | POA: Diagnosis not present

## 2019-11-26 DIAGNOSIS — C7800 Secondary malignant neoplasm of unspecified lung: Secondary | ICD-10-CM | POA: Diagnosis not present

## 2019-11-26 DIAGNOSIS — C50411 Malignant neoplasm of upper-outer quadrant of right female breast: Secondary | ICD-10-CM | POA: Diagnosis not present

## 2019-11-26 DIAGNOSIS — C787 Secondary malignant neoplasm of liver and intrahepatic bile duct: Secondary | ICD-10-CM | POA: Diagnosis not present

## 2019-11-26 DIAGNOSIS — C7951 Secondary malignant neoplasm of bone: Secondary | ICD-10-CM | POA: Diagnosis not present

## 2019-11-27 ENCOUNTER — Telehealth: Payer: Self-pay | Admitting: *Deleted

## 2019-11-27 DIAGNOSIS — C50411 Malignant neoplasm of upper-outer quadrant of right female breast: Secondary | ICD-10-CM

## 2019-11-27 DIAGNOSIS — C7970 Secondary malignant neoplasm of unspecified adrenal gland: Secondary | ICD-10-CM | POA: Diagnosis not present

## 2019-11-27 DIAGNOSIS — C787 Secondary malignant neoplasm of liver and intrahepatic bile duct: Secondary | ICD-10-CM | POA: Diagnosis not present

## 2019-11-27 DIAGNOSIS — I69318 Other symptoms and signs involving cognitive functions following cerebral infarction: Secondary | ICD-10-CM | POA: Diagnosis not present

## 2019-11-27 DIAGNOSIS — C7800 Secondary malignant neoplasm of unspecified lung: Secondary | ICD-10-CM | POA: Diagnosis not present

## 2019-11-27 DIAGNOSIS — C7951 Secondary malignant neoplasm of bone: Secondary | ICD-10-CM | POA: Diagnosis not present

## 2019-11-27 MED ORDER — GABAPENTIN 100 MG PO CAPS: 100.0000 mg | ORAL_CAPSULE | Freq: Two times a day (BID) | ORAL | 0 refills | Status: DC

## 2019-11-27 MED ORDER — HYDROCODONE-ACETAMINOPHEN 5-325 MG PO TABS: 1.0000 | ORAL_TABLET | Freq: Four times a day (QID) | ORAL | 0 refills | Status: DC | PRN

## 2019-11-27 NOTE — Telephone Encounter (Signed)
Jonna Clark (RN - [facility]) would like the following:  1.  Patient was taking MS Morphine once daily.  She will now take it twice daily as prescribed.  2.  Request for refills of Hydrocodne 5/325 for breakthrough pain as needed.  3.  Request for Gabapentin from 3 times daily to 2 times daily because of dizziness and fear of a fall.  [facility]  Corie Chiquito RN [phone]

## 2019-11-28 DIAGNOSIS — C7970 Secondary malignant neoplasm of unspecified adrenal gland: Secondary | ICD-10-CM | POA: Diagnosis not present

## 2019-11-28 DIAGNOSIS — C7800 Secondary malignant neoplasm of unspecified lung: Secondary | ICD-10-CM | POA: Diagnosis not present

## 2019-11-28 DIAGNOSIS — C50411 Malignant neoplasm of upper-outer quadrant of right female breast: Secondary | ICD-10-CM | POA: Diagnosis not present

## 2019-11-28 DIAGNOSIS — I69318 Other symptoms and signs involving cognitive functions following cerebral infarction: Secondary | ICD-10-CM | POA: Diagnosis not present

## 2019-11-28 DIAGNOSIS — C787 Secondary malignant neoplasm of liver and intrahepatic bile duct: Secondary | ICD-10-CM | POA: Diagnosis not present

## 2019-11-28 DIAGNOSIS — C7951 Secondary malignant neoplasm of bone: Secondary | ICD-10-CM | POA: Diagnosis not present

## 2019-11-29 DIAGNOSIS — I10 Essential (primary) hypertension: Secondary | ICD-10-CM | POA: Diagnosis not present

## 2019-11-29 DIAGNOSIS — K219 Gastro-esophageal reflux disease without esophagitis: Secondary | ICD-10-CM | POA: Diagnosis not present

## 2019-11-29 DIAGNOSIS — E785 Hyperlipidemia, unspecified: Secondary | ICD-10-CM | POA: Diagnosis not present

## 2019-11-29 DIAGNOSIS — C7800 Secondary malignant neoplasm of unspecified lung: Secondary | ICD-10-CM | POA: Diagnosis not present

## 2019-11-29 DIAGNOSIS — C50411 Malignant neoplasm of upper-outer quadrant of right female breast: Secondary | ICD-10-CM | POA: Diagnosis not present

## 2019-11-29 DIAGNOSIS — Z6820 Body mass index (BMI) 20.0-20.9, adult: Secondary | ICD-10-CM | POA: Diagnosis not present

## 2019-11-29 DIAGNOSIS — I69318 Other symptoms and signs involving cognitive functions following cerebral infarction: Secondary | ICD-10-CM | POA: Diagnosis not present

## 2019-11-29 DIAGNOSIS — C7951 Secondary malignant neoplasm of bone: Secondary | ICD-10-CM | POA: Diagnosis not present

## 2019-11-29 DIAGNOSIS — C7970 Secondary malignant neoplasm of unspecified adrenal gland: Secondary | ICD-10-CM | POA: Diagnosis not present

## 2019-11-29 DIAGNOSIS — C787 Secondary malignant neoplasm of liver and intrahepatic bile duct: Secondary | ICD-10-CM | POA: Diagnosis not present

## 2019-11-30 DIAGNOSIS — C7800 Secondary malignant neoplasm of unspecified lung: Secondary | ICD-10-CM | POA: Diagnosis not present

## 2019-11-30 DIAGNOSIS — C787 Secondary malignant neoplasm of liver and intrahepatic bile duct: Secondary | ICD-10-CM | POA: Diagnosis not present

## 2019-11-30 DIAGNOSIS — C7951 Secondary malignant neoplasm of bone: Secondary | ICD-10-CM | POA: Diagnosis not present

## 2019-11-30 DIAGNOSIS — I69318 Other symptoms and signs involving cognitive functions following cerebral infarction: Secondary | ICD-10-CM | POA: Diagnosis not present

## 2019-11-30 DIAGNOSIS — C50411 Malignant neoplasm of upper-outer quadrant of right female breast: Secondary | ICD-10-CM | POA: Diagnosis not present

## 2019-11-30 DIAGNOSIS — C7970 Secondary malignant neoplasm of unspecified adrenal gland: Secondary | ICD-10-CM | POA: Diagnosis not present

## 2019-11-30 NOTE — Progress Notes (Signed)
COMMUNITY PALLIATIVE CARE RN NOTE  PATIENT NAME: Erica Young DOB: 1940/12/23 MRN: [id number]  PRIMARY CARE PROVIDER: Isaac Bliss, Rayford Halsted, MD  RESPONSIBLE PARTY: Renato Gails (son) Acct ID - Guarantor Home Phone Work Phone Relationship Acct Type  [id number] Durwin [phone]  Self P/F     [place], [place], [place] [address]   Covid-19 Pre-screening Negative  PLAN OF CARE and INTERVENTION:  1. ADVANCE CARE PLANNING/GOALS OF CARE: Goal is for patient to remain in her home with hired caregivers. She has a DNR. 2. PATIENT/CAREGIVER EDUCATION: Bowel Management, Hospice education 3. DISEASE STATUS: Received a call from supervisor at [place] [facility], to report that patient has been having issues with constipation and has not had a BM in 1 week. She is requesting a RN visit. RN visit made. Upon arrival, patient is sitting up on the edge of her bed awake and alert. She is able to answer questions and make her needs known. She has become progressively weaker since recently returning home from [facility] skilled facility for rehab. She is no longer ambulatory, but is able to transfer to a wheelchair with assistance. She is able to turn and reposition herself in bed, but very slowly. She has hired caregivers who are present from 8a-1p and returns from 4p-10p. They assist her with personal care, prepare meals and light housekeeping. She is c/o pain in her lower abdomen, lower back, hips and down both legs. She is taking her pain medication as prescribed, which is helpful. She has been taking Miralax daily, but states this has not seemed to help. Yesterday she had Miralax twice without any results. Her abdomen is soft and non-distended. Performed rectal check. Firm stool noted high up in her rectal vault. Administered a Dulcolax suppository and had her lie on her left side for 15 minutes so suppository could dissolve. Also advised her to start taking Senna S 2 tablets at bedtime and  placed the bottle on her night stand as she requested. Made caregivers aware to push fluids as much as possible. 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 She was supposed to have a hospice admission visit a few months ago, however the day of this visit she had been admitted to the hospital. She then went to [facility] for rehab and is now back at home. She is agreeable to have another hospice admission visit scheduled. I called and spoke with her son, Erica Young, to explain hospice services. He is agreeable. I called Dr. Ledell Noss office who gave verbal order for another hospice consult. I sent this information to [facility] referral department for processing.  I made son aware that order was received and sent. He is Patent attorney.   HISTORY OF PRESENT ILLNESS: This is a 79 yo female who resides at home. 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 Palliative care team continues to follow patient. New hospice consult order received today.  CODE STATUS: DNR ADVANCED DIRECTIVES: Y MOST FORM: no PPS: 30%  (Duration of visit and documentation 75 minutes)   Daryl Eastern, RN BSN

## 2019-12-01 DIAGNOSIS — C7951 Secondary malignant neoplasm of bone: Secondary | ICD-10-CM | POA: Diagnosis not present

## 2019-12-01 DIAGNOSIS — C7800 Secondary malignant neoplasm of unspecified lung: Secondary | ICD-10-CM | POA: Diagnosis not present

## 2019-12-01 DIAGNOSIS — C787 Secondary malignant neoplasm of liver and intrahepatic bile duct: Secondary | ICD-10-CM | POA: Diagnosis not present

## 2019-12-01 DIAGNOSIS — C50411 Malignant neoplasm of upper-outer quadrant of right female breast: Secondary | ICD-10-CM | POA: Diagnosis not present

## 2019-12-01 DIAGNOSIS — C7970 Secondary malignant neoplasm of unspecified adrenal gland: Secondary | ICD-10-CM | POA: Diagnosis not present

## 2019-12-01 DIAGNOSIS — I69318 Other symptoms and signs involving cognitive functions following cerebral infarction: Secondary | ICD-10-CM | POA: Diagnosis not present

## 2019-12-02 DIAGNOSIS — C7951 Secondary malignant neoplasm of bone: Secondary | ICD-10-CM | POA: Diagnosis not present

## 2019-12-02 DIAGNOSIS — C7800 Secondary malignant neoplasm of unspecified lung: Secondary | ICD-10-CM | POA: Diagnosis not present

## 2019-12-02 DIAGNOSIS — I69318 Other symptoms and signs involving cognitive functions following cerebral infarction: Secondary | ICD-10-CM | POA: Diagnosis not present

## 2019-12-02 DIAGNOSIS — C7970 Secondary malignant neoplasm of unspecified adrenal gland: Secondary | ICD-10-CM | POA: Diagnosis not present

## 2019-12-02 DIAGNOSIS — C50411 Malignant neoplasm of upper-outer quadrant of right female breast: Secondary | ICD-10-CM | POA: Diagnosis not present

## 2019-12-02 DIAGNOSIS — C787 Secondary malignant neoplasm of liver and intrahepatic bile duct: Secondary | ICD-10-CM | POA: Diagnosis not present

## 2019-12-03 DIAGNOSIS — C787 Secondary malignant neoplasm of liver and intrahepatic bile duct: Secondary | ICD-10-CM | POA: Diagnosis not present

## 2019-12-03 DIAGNOSIS — C7800 Secondary malignant neoplasm of unspecified lung: Secondary | ICD-10-CM | POA: Diagnosis not present

## 2019-12-03 DIAGNOSIS — C7970 Secondary malignant neoplasm of unspecified adrenal gland: Secondary | ICD-10-CM | POA: Diagnosis not present

## 2019-12-03 DIAGNOSIS — I69318 Other symptoms and signs involving cognitive functions following cerebral infarction: Secondary | ICD-10-CM | POA: Diagnosis not present

## 2019-12-03 DIAGNOSIS — C7951 Secondary malignant neoplasm of bone: Secondary | ICD-10-CM | POA: Diagnosis not present

## 2019-12-03 DIAGNOSIS — C50411 Malignant neoplasm of upper-outer quadrant of right female breast: Secondary | ICD-10-CM | POA: Diagnosis not present

## 2019-12-04 DIAGNOSIS — C7951 Secondary malignant neoplasm of bone: Secondary | ICD-10-CM | POA: Diagnosis not present

## 2019-12-04 DIAGNOSIS — C787 Secondary malignant neoplasm of liver and intrahepatic bile duct: Secondary | ICD-10-CM | POA: Diagnosis not present

## 2019-12-04 DIAGNOSIS — C7970 Secondary malignant neoplasm of unspecified adrenal gland: Secondary | ICD-10-CM | POA: Diagnosis not present

## 2019-12-04 DIAGNOSIS — C50411 Malignant neoplasm of upper-outer quadrant of right female breast: Secondary | ICD-10-CM | POA: Diagnosis not present

## 2019-12-04 DIAGNOSIS — I69318 Other symptoms and signs involving cognitive functions following cerebral infarction: Secondary | ICD-10-CM | POA: Diagnosis not present

## 2019-12-04 DIAGNOSIS — C7800 Secondary malignant neoplasm of unspecified lung: Secondary | ICD-10-CM | POA: Diagnosis not present

## 2019-12-08 ENCOUNTER — Other Ambulatory Visit: Payer: Self-pay | Admitting: Oncology

## 2019-12-09 DIAGNOSIS — C787 Secondary malignant neoplasm of liver and intrahepatic bile duct: Secondary | ICD-10-CM | POA: Diagnosis not present

## 2019-12-09 DIAGNOSIS — I69318 Other symptoms and signs involving cognitive functions following cerebral infarction: Secondary | ICD-10-CM | POA: Diagnosis not present

## 2019-12-09 DIAGNOSIS — C50411 Malignant neoplasm of upper-outer quadrant of right female breast: Secondary | ICD-10-CM | POA: Diagnosis not present

## 2019-12-09 DIAGNOSIS — C7970 Secondary malignant neoplasm of unspecified adrenal gland: Secondary | ICD-10-CM | POA: Diagnosis not present

## 2019-12-09 DIAGNOSIS — C7951 Secondary malignant neoplasm of bone: Secondary | ICD-10-CM | POA: Diagnosis not present

## 2019-12-09 DIAGNOSIS — C7800 Secondary malignant neoplasm of unspecified lung: Secondary | ICD-10-CM | POA: Diagnosis not present

## 2019-12-10 DIAGNOSIS — I69318 Other symptoms and signs involving cognitive functions following cerebral infarction: Secondary | ICD-10-CM | POA: Diagnosis not present

## 2019-12-10 DIAGNOSIS — C7800 Secondary malignant neoplasm of unspecified lung: Secondary | ICD-10-CM | POA: Diagnosis not present

## 2019-12-10 DIAGNOSIS — C7951 Secondary malignant neoplasm of bone: Secondary | ICD-10-CM | POA: Diagnosis not present

## 2019-12-10 DIAGNOSIS — C787 Secondary malignant neoplasm of liver and intrahepatic bile duct: Secondary | ICD-10-CM | POA: Diagnosis not present

## 2019-12-10 DIAGNOSIS — C7970 Secondary malignant neoplasm of unspecified adrenal gland: Secondary | ICD-10-CM | POA: Diagnosis not present

## 2019-12-10 DIAGNOSIS — C50411 Malignant neoplasm of upper-outer quadrant of right female breast: Secondary | ICD-10-CM | POA: Diagnosis not present

## 2019-12-13 ENCOUNTER — Other Ambulatory Visit: Payer: Self-pay | Admitting: Family Medicine

## 2019-12-13 DIAGNOSIS — C7951 Secondary malignant neoplasm of bone: Secondary | ICD-10-CM | POA: Diagnosis not present

## 2019-12-13 DIAGNOSIS — C787 Secondary malignant neoplasm of liver and intrahepatic bile duct: Secondary | ICD-10-CM | POA: Diagnosis not present

## 2019-12-13 DIAGNOSIS — I69318 Other symptoms and signs involving cognitive functions following cerebral infarction: Secondary | ICD-10-CM | POA: Diagnosis not present

## 2019-12-13 DIAGNOSIS — C50411 Malignant neoplasm of upper-outer quadrant of right female breast: Secondary | ICD-10-CM | POA: Diagnosis not present

## 2019-12-13 DIAGNOSIS — C7800 Secondary malignant neoplasm of unspecified lung: Secondary | ICD-10-CM | POA: Diagnosis not present

## 2019-12-13 DIAGNOSIS — C7970 Secondary malignant neoplasm of unspecified adrenal gland: Secondary | ICD-10-CM | POA: Diagnosis not present

## 2019-12-13 MED ORDER — MECLIZINE HCL 25 MG PO TABS: 25.0000 mg | ORAL_TABLET | Freq: Three times a day (TID) | ORAL | 0 refills | Status: AC | PRN

## 2019-12-13 NOTE — Progress Notes (Signed)
Received call from after hours nurse. Stated patient's hospice nurse called requesting medication to help with vertigo. Will send in meclizine - she has been on this previously.  Algis Greenhouse. Jerline Pain, MD 12/13/2019 8:32 PM

## 2019-12-14 DIAGNOSIS — I69318 Other symptoms and signs involving cognitive functions following cerebral infarction: Secondary | ICD-10-CM | POA: Diagnosis not present

## 2019-12-14 DIAGNOSIS — C50411 Malignant neoplasm of upper-outer quadrant of right female breast: Secondary | ICD-10-CM | POA: Diagnosis not present

## 2019-12-14 DIAGNOSIS — C7951 Secondary malignant neoplasm of bone: Secondary | ICD-10-CM | POA: Diagnosis not present

## 2019-12-14 DIAGNOSIS — C7970 Secondary malignant neoplasm of unspecified adrenal gland: Secondary | ICD-10-CM | POA: Diagnosis not present

## 2019-12-14 DIAGNOSIS — C7800 Secondary malignant neoplasm of unspecified lung: Secondary | ICD-10-CM | POA: Diagnosis not present

## 2019-12-14 DIAGNOSIS — C787 Secondary malignant neoplasm of liver and intrahepatic bile duct: Secondary | ICD-10-CM | POA: Diagnosis not present

## 2019-12-16 ENCOUNTER — Other Ambulatory Visit: Payer: Self-pay | Admitting: Internal Medicine

## 2019-12-16 ENCOUNTER — Telehealth: Payer: Self-pay | Admitting: Internal Medicine

## 2019-12-16 DIAGNOSIS — C7970 Secondary malignant neoplasm of unspecified adrenal gland: Secondary | ICD-10-CM | POA: Diagnosis not present

## 2019-12-16 DIAGNOSIS — C50411 Malignant neoplasm of upper-outer quadrant of right female breast: Secondary | ICD-10-CM

## 2019-12-16 DIAGNOSIS — I69318 Other symptoms and signs involving cognitive functions following cerebral infarction: Secondary | ICD-10-CM | POA: Diagnosis not present

## 2019-12-16 DIAGNOSIS — C787 Secondary malignant neoplasm of liver and intrahepatic bile duct: Secondary | ICD-10-CM | POA: Diagnosis not present

## 2019-12-16 DIAGNOSIS — C7951 Secondary malignant neoplasm of bone: Secondary | ICD-10-CM | POA: Diagnosis not present

## 2019-12-16 DIAGNOSIS — C50919 Malignant neoplasm of unspecified site of unspecified female breast: Secondary | ICD-10-CM

## 2019-12-16 DIAGNOSIS — C7800 Secondary malignant neoplasm of unspecified lung: Secondary | ICD-10-CM | POA: Diagnosis not present

## 2019-12-16 NOTE — Telephone Encounter (Signed)
Manuela Schwartz stated that the pt needs her pain medication called in.  Manuela Schwartz does not have a secured line to leave a msg on but will try to pick up when you call back.

## 2019-12-16 NOTE — Telephone Encounter (Signed)
I don't see a med refill request from pharmacy?

## 2019-12-17 ENCOUNTER — Telehealth: Payer: Self-pay | Admitting: *Deleted

## 2019-12-17 ENCOUNTER — Other Ambulatory Visit: Payer: Self-pay | Admitting: Internal Medicine

## 2019-12-17 DIAGNOSIS — C50919 Malignant neoplasm of unspecified site of unspecified female breast: Secondary | ICD-10-CM

## 2019-12-17 MED ORDER — MORPHINE SULFATE ER 15 MG PO TBCR: 15.0000 mg | EXTENDED_RELEASE_TABLET | Freq: Two times a day (BID) | ORAL | 0 refills | Status: AC

## 2019-12-17 NOTE — Telephone Encounter (Signed)
Erica Young is calling back concerning the pts medication (want to make sure that it is there for the pts son to pick up) and would like to have a call back.

## 2019-12-17 NOTE — Telephone Encounter (Signed)
VM left by Manuela Schwartz RN with [facility] wanting to let Dr Jannifer Rodney know pt is now under hospice care ( ordered placed by another non cancer provider ).  Pt informed staff she wants to continue the exemestane ordered by Dr Jannifer Rodney "until her supply runs out in 30 days "  No current needs at this time.

## 2019-12-17 NOTE — Telephone Encounter (Signed)
Rx sent 

## 2019-12-17 NOTE — Telephone Encounter (Signed)
Spoke with Manuela Schwartz and patient needs a refill of  morphine (MS CONTIN) 15 MG 12 hr tablet  Patient also has a prescription from Hospice for Meclizine 12.5 #5 and a prescription for 25 mg from Dr Jerline Pain.  Manuela Schwartz will call back if needed to adjust the prescription.

## 2019-12-17 NOTE — Telephone Encounter (Signed)
Can we find out what meds they are requesting refills of?

## 2019-12-18 DIAGNOSIS — C7800 Secondary malignant neoplasm of unspecified lung: Secondary | ICD-10-CM | POA: Diagnosis not present

## 2019-12-18 DIAGNOSIS — C50411 Malignant neoplasm of upper-outer quadrant of right female breast: Secondary | ICD-10-CM | POA: Diagnosis not present

## 2019-12-18 DIAGNOSIS — C787 Secondary malignant neoplasm of liver and intrahepatic bile duct: Secondary | ICD-10-CM | POA: Diagnosis not present

## 2019-12-18 DIAGNOSIS — I69318 Other symptoms and signs involving cognitive functions following cerebral infarction: Secondary | ICD-10-CM | POA: Diagnosis not present

## 2019-12-18 DIAGNOSIS — C7970 Secondary malignant neoplasm of unspecified adrenal gland: Secondary | ICD-10-CM | POA: Diagnosis not present

## 2019-12-18 DIAGNOSIS — C7951 Secondary malignant neoplasm of bone: Secondary | ICD-10-CM | POA: Diagnosis not present

## 2019-12-23 DIAGNOSIS — C787 Secondary malignant neoplasm of liver and intrahepatic bile duct: Secondary | ICD-10-CM | POA: Diagnosis not present

## 2019-12-23 DIAGNOSIS — C7951 Secondary malignant neoplasm of bone: Secondary | ICD-10-CM | POA: Diagnosis not present

## 2019-12-23 DIAGNOSIS — C7970 Secondary malignant neoplasm of unspecified adrenal gland: Secondary | ICD-10-CM | POA: Diagnosis not present

## 2019-12-23 DIAGNOSIS — I69318 Other symptoms and signs involving cognitive functions following cerebral infarction: Secondary | ICD-10-CM | POA: Diagnosis not present

## 2019-12-23 DIAGNOSIS — C7800 Secondary malignant neoplasm of unspecified lung: Secondary | ICD-10-CM | POA: Diagnosis not present

## 2019-12-23 DIAGNOSIS — C50411 Malignant neoplasm of upper-outer quadrant of right female breast: Secondary | ICD-10-CM | POA: Diagnosis not present

## 2019-12-30 ENCOUNTER — Other Ambulatory Visit: Payer: Self-pay

## 2019-12-30 DIAGNOSIS — I10 Essential (primary) hypertension: Secondary | ICD-10-CM | POA: Diagnosis not present

## 2019-12-30 DIAGNOSIS — K219 Gastro-esophageal reflux disease without esophagitis: Secondary | ICD-10-CM | POA: Diagnosis not present

## 2019-12-30 DIAGNOSIS — C7951 Secondary malignant neoplasm of bone: Secondary | ICD-10-CM | POA: Diagnosis not present

## 2019-12-30 DIAGNOSIS — C7800 Secondary malignant neoplasm of unspecified lung: Secondary | ICD-10-CM | POA: Diagnosis not present

## 2019-12-30 DIAGNOSIS — C787 Secondary malignant neoplasm of liver and intrahepatic bile duct: Secondary | ICD-10-CM | POA: Diagnosis not present

## 2019-12-30 DIAGNOSIS — C7970 Secondary malignant neoplasm of unspecified adrenal gland: Secondary | ICD-10-CM | POA: Diagnosis not present

## 2019-12-30 DIAGNOSIS — I69318 Other symptoms and signs involving cognitive functions following cerebral infarction: Secondary | ICD-10-CM | POA: Diagnosis not present

## 2019-12-30 DIAGNOSIS — E785 Hyperlipidemia, unspecified: Secondary | ICD-10-CM | POA: Diagnosis not present

## 2019-12-30 DIAGNOSIS — C50411 Malignant neoplasm of upper-outer quadrant of right female breast: Secondary | ICD-10-CM | POA: Diagnosis not present

## 2019-12-30 DIAGNOSIS — Z6820 Body mass index (BMI) 20.0-20.9, adult: Secondary | ICD-10-CM | POA: Diagnosis not present

## 2019-12-30 NOTE — Patient Outreach (Signed)
[facility]) Care Management  12/30/2019  Erica Young [birth year] [id number]   Telephone call to [facility]. Patient is active with hospice at this time.  Plan: RN CM will close case.  Jone Baseman, RN, MSN [facility] Care Management Coordinator Direct Line [phone] Cell [phone] Toll Free: [phone]  Fax: [phone]

## 2019-12-31 ENCOUNTER — Other Ambulatory Visit: Payer: Self-pay | Admitting: Internal Medicine

## 2019-12-31 DIAGNOSIS — I69318 Other symptoms and signs involving cognitive functions following cerebral infarction: Secondary | ICD-10-CM | POA: Diagnosis not present

## 2019-12-31 DIAGNOSIS — C7800 Secondary malignant neoplasm of unspecified lung: Secondary | ICD-10-CM | POA: Diagnosis not present

## 2019-12-31 DIAGNOSIS — C787 Secondary malignant neoplasm of liver and intrahepatic bile duct: Secondary | ICD-10-CM | POA: Diagnosis not present

## 2019-12-31 DIAGNOSIS — C50411 Malignant neoplasm of upper-outer quadrant of right female breast: Secondary | ICD-10-CM

## 2019-12-31 DIAGNOSIS — C7970 Secondary malignant neoplasm of unspecified adrenal gland: Secondary | ICD-10-CM | POA: Diagnosis not present

## 2019-12-31 DIAGNOSIS — C7951 Secondary malignant neoplasm of bone: Secondary | ICD-10-CM | POA: Diagnosis not present

## 2019-12-31 MED ORDER — GABAPENTIN 100 MG PO CAPS: 100.0000 mg | ORAL_CAPSULE | Freq: Every morning | ORAL | 1 refills | Status: AC

## 2019-12-31 MED ORDER — HYDROCODONE-ACETAMINOPHEN 5-325 MG PO TABS: 1.0000 | ORAL_TABLET | Freq: Four times a day (QID) | ORAL | 0 refills | Status: AC | PRN

## 2019-12-31 MED ORDER — GABAPENTIN 300 MG PO CAPS: 300.0000 mg | ORAL_CAPSULE | Freq: Every day | ORAL | 1 refills | Status: AC

## 2019-12-31 NOTE — Telephone Encounter (Signed)
For her eye.

## 2019-12-31 NOTE — Telephone Encounter (Signed)
Spoke with Brandon Melnick RN with hospice and she requests the following:  1.  Refill of Hydrocodone for pain management.  2.  Patient is complaining of of nerve pain starting at her toes and going to her legs.  RN requesting an increase of Gabapentin 300 mg at night and will continue Gabapentin 100 mg in the morning.  3.  RN would also like for Dr Jerilee Hoh to consider adding a Rx for a steroid.

## 2019-12-31 NOTE — Telephone Encounter (Signed)
Ok to refill Hydrocodone. [place] with requested increase in gabapentin dose. What is the steroid supposed to be for?  Erica Young

## 2019-12-31 NOTE — Telephone Encounter (Signed)
Needing a change in a med due to more pain and a refill.  Requesting a call for this matter.

## 2020-01-01 ENCOUNTER — Ambulatory Visit: Payer: Self-pay | Admitting: *Deleted

## 2020-01-01 DIAGNOSIS — C787 Secondary malignant neoplasm of liver and intrahepatic bile duct: Secondary | ICD-10-CM | POA: Diagnosis not present

## 2020-01-01 DIAGNOSIS — I69318 Other symptoms and signs involving cognitive functions following cerebral infarction: Secondary | ICD-10-CM | POA: Diagnosis not present

## 2020-01-01 DIAGNOSIS — C7800 Secondary malignant neoplasm of unspecified lung: Secondary | ICD-10-CM | POA: Diagnosis not present

## 2020-01-01 DIAGNOSIS — C7970 Secondary malignant neoplasm of unspecified adrenal gland: Secondary | ICD-10-CM | POA: Diagnosis not present

## 2020-01-01 DIAGNOSIS — C50411 Malignant neoplasm of upper-outer quadrant of right female breast: Secondary | ICD-10-CM | POA: Diagnosis not present

## 2020-01-01 DIAGNOSIS — C7951 Secondary malignant neoplasm of bone: Secondary | ICD-10-CM | POA: Diagnosis not present

## 2020-01-06 DIAGNOSIS — C787 Secondary malignant neoplasm of liver and intrahepatic bile duct: Secondary | ICD-10-CM | POA: Diagnosis not present

## 2020-01-06 DIAGNOSIS — C7951 Secondary malignant neoplasm of bone: Secondary | ICD-10-CM | POA: Diagnosis not present

## 2020-01-06 DIAGNOSIS — C7970 Secondary malignant neoplasm of unspecified adrenal gland: Secondary | ICD-10-CM | POA: Diagnosis not present

## 2020-01-06 DIAGNOSIS — C50411 Malignant neoplasm of upper-outer quadrant of right female breast: Secondary | ICD-10-CM | POA: Diagnosis not present

## 2020-01-06 DIAGNOSIS — I69318 Other symptoms and signs involving cognitive functions following cerebral infarction: Secondary | ICD-10-CM | POA: Diagnosis not present

## 2020-01-06 DIAGNOSIS — C7800 Secondary malignant neoplasm of unspecified lung: Secondary | ICD-10-CM | POA: Diagnosis not present

## 2020-01-07 DIAGNOSIS — C7800 Secondary malignant neoplasm of unspecified lung: Secondary | ICD-10-CM | POA: Diagnosis not present

## 2020-01-07 DIAGNOSIS — I69318 Other symptoms and signs involving cognitive functions following cerebral infarction: Secondary | ICD-10-CM | POA: Diagnosis not present

## 2020-01-07 DIAGNOSIS — C50411 Malignant neoplasm of upper-outer quadrant of right female breast: Secondary | ICD-10-CM | POA: Diagnosis not present

## 2020-01-07 DIAGNOSIS — C787 Secondary malignant neoplasm of liver and intrahepatic bile duct: Secondary | ICD-10-CM | POA: Diagnosis not present

## 2020-01-07 DIAGNOSIS — C7951 Secondary malignant neoplasm of bone: Secondary | ICD-10-CM | POA: Diagnosis not present

## 2020-01-07 DIAGNOSIS — C7970 Secondary malignant neoplasm of unspecified adrenal gland: Secondary | ICD-10-CM | POA: Diagnosis not present

## 2020-01-08 DIAGNOSIS — C7951 Secondary malignant neoplasm of bone: Secondary | ICD-10-CM | POA: Diagnosis not present

## 2020-01-08 DIAGNOSIS — C50411 Malignant neoplasm of upper-outer quadrant of right female breast: Secondary | ICD-10-CM | POA: Diagnosis not present

## 2020-01-08 DIAGNOSIS — C787 Secondary malignant neoplasm of liver and intrahepatic bile duct: Secondary | ICD-10-CM | POA: Diagnosis not present

## 2020-01-08 DIAGNOSIS — C7800 Secondary malignant neoplasm of unspecified lung: Secondary | ICD-10-CM | POA: Diagnosis not present

## 2020-01-08 DIAGNOSIS — C7970 Secondary malignant neoplasm of unspecified adrenal gland: Secondary | ICD-10-CM | POA: Diagnosis not present

## 2020-01-08 DIAGNOSIS — I69318 Other symptoms and signs involving cognitive functions following cerebral infarction: Secondary | ICD-10-CM | POA: Diagnosis not present

## 2020-01-11 DIAGNOSIS — I69318 Other symptoms and signs involving cognitive functions following cerebral infarction: Secondary | ICD-10-CM | POA: Diagnosis not present

## 2020-01-11 DIAGNOSIS — C7951 Secondary malignant neoplasm of bone: Secondary | ICD-10-CM | POA: Diagnosis not present

## 2020-01-11 DIAGNOSIS — C787 Secondary malignant neoplasm of liver and intrahepatic bile duct: Secondary | ICD-10-CM | POA: Diagnosis not present

## 2020-01-11 DIAGNOSIS — C7800 Secondary malignant neoplasm of unspecified lung: Secondary | ICD-10-CM | POA: Diagnosis not present

## 2020-01-11 DIAGNOSIS — C7970 Secondary malignant neoplasm of unspecified adrenal gland: Secondary | ICD-10-CM | POA: Diagnosis not present

## 2020-01-11 DIAGNOSIS — C50411 Malignant neoplasm of upper-outer quadrant of right female breast: Secondary | ICD-10-CM | POA: Diagnosis not present

## 2020-01-12 DIAGNOSIS — C50411 Malignant neoplasm of upper-outer quadrant of right female breast: Secondary | ICD-10-CM | POA: Diagnosis not present

## 2020-01-12 DIAGNOSIS — C7800 Secondary malignant neoplasm of unspecified lung: Secondary | ICD-10-CM | POA: Diagnosis not present

## 2020-01-12 DIAGNOSIS — I69318 Other symptoms and signs involving cognitive functions following cerebral infarction: Secondary | ICD-10-CM | POA: Diagnosis not present

## 2020-01-12 DIAGNOSIS — C787 Secondary malignant neoplasm of liver and intrahepatic bile duct: Secondary | ICD-10-CM | POA: Diagnosis not present

## 2020-01-12 DIAGNOSIS — C7951 Secondary malignant neoplasm of bone: Secondary | ICD-10-CM | POA: Diagnosis not present

## 2020-01-12 DIAGNOSIS — C7970 Secondary malignant neoplasm of unspecified adrenal gland: Secondary | ICD-10-CM | POA: Diagnosis not present

## 2020-01-13 ENCOUNTER — Telehealth: Payer: Self-pay | Admitting: Internal Medicine

## 2020-01-13 NOTE — Telephone Encounter (Signed)
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  She is nearing end of life and needs to be transferred there as soon as room is available- she is on the waiting list.  She is still at home at this time.    As a FYI, however if you would like to discuss this further you are welcome to give Erica Young a call.

## 2020-01-13 NOTE — Telephone Encounter (Signed)
Sad to hear this. I remain available for any immediate needs.

## 2020-01-13 NOTE — Telephone Encounter (Signed)
Spoke with Mauren the hospice nurse and patient will be going to [facility] in [place].

## 2020-01-14 DIAGNOSIS — C7800 Secondary malignant neoplasm of unspecified lung: Secondary | ICD-10-CM | POA: Diagnosis not present

## 2020-01-14 DIAGNOSIS — C7970 Secondary malignant neoplasm of unspecified adrenal gland: Secondary | ICD-10-CM | POA: Diagnosis not present

## 2020-01-14 DIAGNOSIS — C787 Secondary malignant neoplasm of liver and intrahepatic bile duct: Secondary | ICD-10-CM | POA: Diagnosis not present

## 2020-01-14 DIAGNOSIS — C7951 Secondary malignant neoplasm of bone: Secondary | ICD-10-CM | POA: Diagnosis not present

## 2020-01-14 DIAGNOSIS — C50411 Malignant neoplasm of upper-outer quadrant of right female breast: Secondary | ICD-10-CM | POA: Diagnosis not present

## 2020-01-14 DIAGNOSIS — I69318 Other symptoms and signs involving cognitive functions following cerebral infarction: Secondary | ICD-10-CM | POA: Diagnosis not present

## 2020-01-15 DIAGNOSIS — C787 Secondary malignant neoplasm of liver and intrahepatic bile duct: Secondary | ICD-10-CM | POA: Diagnosis not present

## 2020-01-15 DIAGNOSIS — C7951 Secondary malignant neoplasm of bone: Secondary | ICD-10-CM | POA: Diagnosis not present

## 2020-01-15 DIAGNOSIS — C50411 Malignant neoplasm of upper-outer quadrant of right female breast: Secondary | ICD-10-CM | POA: Diagnosis not present

## 2020-01-15 DIAGNOSIS — I69318 Other symptoms and signs involving cognitive functions following cerebral infarction: Secondary | ICD-10-CM | POA: Diagnosis not present

## 2020-01-15 DIAGNOSIS — C7800 Secondary malignant neoplasm of unspecified lung: Secondary | ICD-10-CM | POA: Diagnosis not present

## 2020-01-15 DIAGNOSIS — C7970 Secondary malignant neoplasm of unspecified adrenal gland: Secondary | ICD-10-CM | POA: Diagnosis not present

## 2020-01-16 DIAGNOSIS — C7951 Secondary malignant neoplasm of bone: Secondary | ICD-10-CM | POA: Diagnosis not present

## 2020-01-16 DIAGNOSIS — C7800 Secondary malignant neoplasm of unspecified lung: Secondary | ICD-10-CM | POA: Diagnosis not present

## 2020-01-16 DIAGNOSIS — C787 Secondary malignant neoplasm of liver and intrahepatic bile duct: Secondary | ICD-10-CM | POA: Diagnosis not present

## 2020-01-16 DIAGNOSIS — C7970 Secondary malignant neoplasm of unspecified adrenal gland: Secondary | ICD-10-CM | POA: Diagnosis not present

## 2020-01-16 DIAGNOSIS — I69318 Other symptoms and signs involving cognitive functions following cerebral infarction: Secondary | ICD-10-CM | POA: Diagnosis not present

## 2020-01-16 DIAGNOSIS — C50411 Malignant neoplasm of upper-outer quadrant of right female breast: Secondary | ICD-10-CM | POA: Diagnosis not present

## 2020-01-17 DIAGNOSIS — C787 Secondary malignant neoplasm of liver and intrahepatic bile duct: Secondary | ICD-10-CM | POA: Diagnosis not present

## 2020-01-17 DIAGNOSIS — C7951 Secondary malignant neoplasm of bone: Secondary | ICD-10-CM | POA: Diagnosis not present

## 2020-01-17 DIAGNOSIS — I69318 Other symptoms and signs involving cognitive functions following cerebral infarction: Secondary | ICD-10-CM | POA: Diagnosis not present

## 2020-01-17 DIAGNOSIS — C7800 Secondary malignant neoplasm of unspecified lung: Secondary | ICD-10-CM | POA: Diagnosis not present

## 2020-01-17 DIAGNOSIS — C7970 Secondary malignant neoplasm of unspecified adrenal gland: Secondary | ICD-10-CM | POA: Diagnosis not present

## 2020-01-17 DIAGNOSIS — C50411 Malignant neoplasm of upper-outer quadrant of right female breast: Secondary | ICD-10-CM | POA: Diagnosis not present

## 2020-01-18 DIAGNOSIS — C50411 Malignant neoplasm of upper-outer quadrant of right female breast: Secondary | ICD-10-CM | POA: Diagnosis not present

## 2020-01-18 DIAGNOSIS — C787 Secondary malignant neoplasm of liver and intrahepatic bile duct: Secondary | ICD-10-CM | POA: Diagnosis not present

## 2020-01-18 DIAGNOSIS — C7951 Secondary malignant neoplasm of bone: Secondary | ICD-10-CM | POA: Diagnosis not present

## 2020-01-18 DIAGNOSIS — C7970 Secondary malignant neoplasm of unspecified adrenal gland: Secondary | ICD-10-CM | POA: Diagnosis not present

## 2020-01-18 DIAGNOSIS — C7800 Secondary malignant neoplasm of unspecified lung: Secondary | ICD-10-CM | POA: Diagnosis not present

## 2020-01-18 DIAGNOSIS — I69318 Other symptoms and signs involving cognitive functions following cerebral infarction: Secondary | ICD-10-CM | POA: Diagnosis not present

## 2020-01-19 DIAGNOSIS — C50411 Malignant neoplasm of upper-outer quadrant of right female breast: Secondary | ICD-10-CM | POA: Diagnosis not present

## 2020-01-19 DIAGNOSIS — C7970 Secondary malignant neoplasm of unspecified adrenal gland: Secondary | ICD-10-CM | POA: Diagnosis not present

## 2020-01-19 DIAGNOSIS — C787 Secondary malignant neoplasm of liver and intrahepatic bile duct: Secondary | ICD-10-CM | POA: Diagnosis not present

## 2020-01-19 DIAGNOSIS — C7951 Secondary malignant neoplasm of bone: Secondary | ICD-10-CM | POA: Diagnosis not present

## 2020-01-19 DIAGNOSIS — I69318 Other symptoms and signs involving cognitive functions following cerebral infarction: Secondary | ICD-10-CM | POA: Diagnosis not present

## 2020-01-19 DIAGNOSIS — C7800 Secondary malignant neoplasm of unspecified lung: Secondary | ICD-10-CM | POA: Diagnosis not present

## 2020-01-20 DIAGNOSIS — C7951 Secondary malignant neoplasm of bone: Secondary | ICD-10-CM | POA: Diagnosis not present

## 2020-01-20 DIAGNOSIS — I69318 Other symptoms and signs involving cognitive functions following cerebral infarction: Secondary | ICD-10-CM | POA: Diagnosis not present

## 2020-01-20 DIAGNOSIS — C7970 Secondary malignant neoplasm of unspecified adrenal gland: Secondary | ICD-10-CM | POA: Diagnosis not present

## 2020-01-20 DIAGNOSIS — C7800 Secondary malignant neoplasm of unspecified lung: Secondary | ICD-10-CM | POA: Diagnosis not present

## 2020-01-20 DIAGNOSIS — C787 Secondary malignant neoplasm of liver and intrahepatic bile duct: Secondary | ICD-10-CM | POA: Diagnosis not present

## 2020-01-20 DIAGNOSIS — C50411 Malignant neoplasm of upper-outer quadrant of right female breast: Secondary | ICD-10-CM | POA: Diagnosis not present

## 2020-01-21 DIAGNOSIS — I69318 Other symptoms and signs involving cognitive functions following cerebral infarction: Secondary | ICD-10-CM | POA: Diagnosis not present

## 2020-01-21 DIAGNOSIS — C50411 Malignant neoplasm of upper-outer quadrant of right female breast: Secondary | ICD-10-CM | POA: Diagnosis not present

## 2020-01-21 DIAGNOSIS — C7800 Secondary malignant neoplasm of unspecified lung: Secondary | ICD-10-CM | POA: Diagnosis not present

## 2020-01-21 DIAGNOSIS — C7951 Secondary malignant neoplasm of bone: Secondary | ICD-10-CM | POA: Diagnosis not present

## 2020-01-21 DIAGNOSIS — C787 Secondary malignant neoplasm of liver and intrahepatic bile duct: Secondary | ICD-10-CM | POA: Diagnosis not present

## 2020-01-21 DIAGNOSIS — C7970 Secondary malignant neoplasm of unspecified adrenal gland: Secondary | ICD-10-CM | POA: Diagnosis not present

## 2020-01-22 DIAGNOSIS — C7951 Secondary malignant neoplasm of bone: Secondary | ICD-10-CM | POA: Diagnosis not present

## 2020-01-22 DIAGNOSIS — I69318 Other symptoms and signs involving cognitive functions following cerebral infarction: Secondary | ICD-10-CM | POA: Diagnosis not present

## 2020-01-22 DIAGNOSIS — C7800 Secondary malignant neoplasm of unspecified lung: Secondary | ICD-10-CM | POA: Diagnosis not present

## 2020-01-22 DIAGNOSIS — C787 Secondary malignant neoplasm of liver and intrahepatic bile duct: Secondary | ICD-10-CM | POA: Diagnosis not present

## 2020-01-22 DIAGNOSIS — C50411 Malignant neoplasm of upper-outer quadrant of right female breast: Secondary | ICD-10-CM | POA: Diagnosis not present

## 2020-01-22 DIAGNOSIS — C7970 Secondary malignant neoplasm of unspecified adrenal gland: Secondary | ICD-10-CM | POA: Diagnosis not present

## 2020-01-23 DIAGNOSIS — C7951 Secondary malignant neoplasm of bone: Secondary | ICD-10-CM | POA: Diagnosis not present

## 2020-01-23 DIAGNOSIS — C7970 Secondary malignant neoplasm of unspecified adrenal gland: Secondary | ICD-10-CM | POA: Diagnosis not present

## 2020-01-23 DIAGNOSIS — I69318 Other symptoms and signs involving cognitive functions following cerebral infarction: Secondary | ICD-10-CM | POA: Diagnosis not present

## 2020-01-23 DIAGNOSIS — C7800 Secondary malignant neoplasm of unspecified lung: Secondary | ICD-10-CM | POA: Diagnosis not present

## 2020-01-23 DIAGNOSIS — C50411 Malignant neoplasm of upper-outer quadrant of right female breast: Secondary | ICD-10-CM | POA: Diagnosis not present

## 2020-01-23 DIAGNOSIS — C787 Secondary malignant neoplasm of liver and intrahepatic bile duct: Secondary | ICD-10-CM | POA: Diagnosis not present

## 2020-01-24 DIAGNOSIS — C7951 Secondary malignant neoplasm of bone: Secondary | ICD-10-CM | POA: Diagnosis not present

## 2020-01-24 DIAGNOSIS — I69318 Other symptoms and signs involving cognitive functions following cerebral infarction: Secondary | ICD-10-CM | POA: Diagnosis not present

## 2020-01-24 DIAGNOSIS — C7800 Secondary malignant neoplasm of unspecified lung: Secondary | ICD-10-CM | POA: Diagnosis not present

## 2020-01-24 DIAGNOSIS — C50411 Malignant neoplasm of upper-outer quadrant of right female breast: Secondary | ICD-10-CM | POA: Diagnosis not present

## 2020-01-24 DIAGNOSIS — C787 Secondary malignant neoplasm of liver and intrahepatic bile duct: Secondary | ICD-10-CM | POA: Diagnosis not present

## 2020-01-24 DIAGNOSIS — C7970 Secondary malignant neoplasm of unspecified adrenal gland: Secondary | ICD-10-CM | POA: Diagnosis not present

## 2020-01-25 DIAGNOSIS — C7800 Secondary malignant neoplasm of unspecified lung: Secondary | ICD-10-CM | POA: Diagnosis not present

## 2020-01-25 DIAGNOSIS — C7970 Secondary malignant neoplasm of unspecified adrenal gland: Secondary | ICD-10-CM | POA: Diagnosis not present

## 2020-01-25 DIAGNOSIS — C7951 Secondary malignant neoplasm of bone: Secondary | ICD-10-CM | POA: Diagnosis not present

## 2020-01-25 DIAGNOSIS — I69318 Other symptoms and signs involving cognitive functions following cerebral infarction: Secondary | ICD-10-CM | POA: Diagnosis not present

## 2020-01-25 DIAGNOSIS — C50411 Malignant neoplasm of upper-outer quadrant of right female breast: Secondary | ICD-10-CM | POA: Diagnosis not present

## 2020-01-25 DIAGNOSIS — C787 Secondary malignant neoplasm of liver and intrahepatic bile duct: Secondary | ICD-10-CM | POA: Diagnosis not present

## 2020-01-26 DIAGNOSIS — C787 Secondary malignant neoplasm of liver and intrahepatic bile duct: Secondary | ICD-10-CM | POA: Diagnosis not present

## 2020-01-26 DIAGNOSIS — C7970 Secondary malignant neoplasm of unspecified adrenal gland: Secondary | ICD-10-CM | POA: Diagnosis not present

## 2020-01-26 DIAGNOSIS — C7951 Secondary malignant neoplasm of bone: Secondary | ICD-10-CM | POA: Diagnosis not present

## 2020-01-26 DIAGNOSIS — C7800 Secondary malignant neoplasm of unspecified lung: Secondary | ICD-10-CM | POA: Diagnosis not present

## 2020-01-26 DIAGNOSIS — C50411 Malignant neoplasm of upper-outer quadrant of right female breast: Secondary | ICD-10-CM | POA: Diagnosis not present

## 2020-01-26 DIAGNOSIS — I69318 Other symptoms and signs involving cognitive functions following cerebral infarction: Secondary | ICD-10-CM | POA: Diagnosis not present

## 2020-01-26 NOTE — Telephone Encounter (Signed)
Non-applicable.

## 2020-01-27 DIAGNOSIS — C7800 Secondary malignant neoplasm of unspecified lung: Secondary | ICD-10-CM | POA: Diagnosis not present

## 2020-01-27 DIAGNOSIS — C7951 Secondary malignant neoplasm of bone: Secondary | ICD-10-CM | POA: Diagnosis not present

## 2020-01-27 DIAGNOSIS — C7970 Secondary malignant neoplasm of unspecified adrenal gland: Secondary | ICD-10-CM | POA: Diagnosis not present

## 2020-01-27 DIAGNOSIS — I69318 Other symptoms and signs involving cognitive functions following cerebral infarction: Secondary | ICD-10-CM | POA: Diagnosis not present

## 2020-01-27 DIAGNOSIS — C787 Secondary malignant neoplasm of liver and intrahepatic bile duct: Secondary | ICD-10-CM | POA: Diagnosis not present

## 2020-01-27 DIAGNOSIS — C50411 Malignant neoplasm of upper-outer quadrant of right female breast: Secondary | ICD-10-CM | POA: Diagnosis not present

## 2020-01-28 DIAGNOSIS — C7951 Secondary malignant neoplasm of bone: Secondary | ICD-10-CM | POA: Diagnosis not present

## 2020-01-28 DIAGNOSIS — C50411 Malignant neoplasm of upper-outer quadrant of right female breast: Secondary | ICD-10-CM | POA: Diagnosis not present

## 2020-01-28 DIAGNOSIS — C787 Secondary malignant neoplasm of liver and intrahepatic bile duct: Secondary | ICD-10-CM | POA: Diagnosis not present

## 2020-01-28 DIAGNOSIS — C7970 Secondary malignant neoplasm of unspecified adrenal gland: Secondary | ICD-10-CM | POA: Diagnosis not present

## 2020-01-28 DIAGNOSIS — I69318 Other symptoms and signs involving cognitive functions following cerebral infarction: Secondary | ICD-10-CM | POA: Diagnosis not present

## 2020-01-28 DIAGNOSIS — C7800 Secondary malignant neoplasm of unspecified lung: Secondary | ICD-10-CM | POA: Diagnosis not present

## 2020-01-29 DIAGNOSIS — C7800 Secondary malignant neoplasm of unspecified lung: Secondary | ICD-10-CM | POA: Diagnosis not present

## 2020-01-29 DIAGNOSIS — C50411 Malignant neoplasm of upper-outer quadrant of right female breast: Secondary | ICD-10-CM | POA: Diagnosis not present

## 2020-01-29 DIAGNOSIS — K219 Gastro-esophageal reflux disease without esophagitis: Secondary | ICD-10-CM | POA: Diagnosis not present

## 2020-01-29 DIAGNOSIS — I69318 Other symptoms and signs involving cognitive functions following cerebral infarction: Secondary | ICD-10-CM | POA: Diagnosis not present

## 2020-01-29 DIAGNOSIS — I10 Essential (primary) hypertension: Secondary | ICD-10-CM | POA: Diagnosis not present

## 2020-01-29 DIAGNOSIS — C787 Secondary malignant neoplasm of liver and intrahepatic bile duct: Secondary | ICD-10-CM | POA: Diagnosis not present

## 2020-01-29 DIAGNOSIS — C7951 Secondary malignant neoplasm of bone: Secondary | ICD-10-CM | POA: Diagnosis not present

## 2020-01-29 DIAGNOSIS — Z6820 Body mass index (BMI) 20.0-20.9, adult: Secondary | ICD-10-CM | POA: Diagnosis not present

## 2020-01-29 DIAGNOSIS — C7970 Secondary malignant neoplasm of unspecified adrenal gland: Secondary | ICD-10-CM | POA: Diagnosis not present

## 2020-01-29 DIAGNOSIS — E785 Hyperlipidemia, unspecified: Secondary | ICD-10-CM | POA: Diagnosis not present

## 2020-01-30 DIAGNOSIS — C7970 Secondary malignant neoplasm of unspecified adrenal gland: Secondary | ICD-10-CM | POA: Diagnosis not present

## 2020-01-30 DIAGNOSIS — C50411 Malignant neoplasm of upper-outer quadrant of right female breast: Secondary | ICD-10-CM | POA: Diagnosis not present

## 2020-01-30 DIAGNOSIS — C7951 Secondary malignant neoplasm of bone: Secondary | ICD-10-CM | POA: Diagnosis not present

## 2020-01-30 DIAGNOSIS — C7800 Secondary malignant neoplasm of unspecified lung: Secondary | ICD-10-CM | POA: Diagnosis not present

## 2020-01-30 DIAGNOSIS — I69318 Other symptoms and signs involving cognitive functions following cerebral infarction: Secondary | ICD-10-CM | POA: Diagnosis not present

## 2020-01-30 DIAGNOSIS — C787 Secondary malignant neoplasm of liver and intrahepatic bile duct: Secondary | ICD-10-CM | POA: Diagnosis not present

## 2020-01-31 DIAGNOSIS — C7970 Secondary malignant neoplasm of unspecified adrenal gland: Secondary | ICD-10-CM | POA: Diagnosis not present

## 2020-01-31 DIAGNOSIS — C50411 Malignant neoplasm of upper-outer quadrant of right female breast: Secondary | ICD-10-CM | POA: Diagnosis not present

## 2020-01-31 DIAGNOSIS — C787 Secondary malignant neoplasm of liver and intrahepatic bile duct: Secondary | ICD-10-CM | POA: Diagnosis not present

## 2020-01-31 DIAGNOSIS — C7951 Secondary malignant neoplasm of bone: Secondary | ICD-10-CM | POA: Diagnosis not present

## 2020-01-31 DIAGNOSIS — I69318 Other symptoms and signs involving cognitive functions following cerebral infarction: Secondary | ICD-10-CM | POA: Diagnosis not present

## 2020-01-31 DIAGNOSIS — C7800 Secondary malignant neoplasm of unspecified lung: Secondary | ICD-10-CM | POA: Diagnosis not present

## 2020-02-01 DIAGNOSIS — C787 Secondary malignant neoplasm of liver and intrahepatic bile duct: Secondary | ICD-10-CM | POA: Diagnosis not present

## 2020-02-01 DIAGNOSIS — C7951 Secondary malignant neoplasm of bone: Secondary | ICD-10-CM | POA: Diagnosis not present

## 2020-02-01 DIAGNOSIS — I69318 Other symptoms and signs involving cognitive functions following cerebral infarction: Secondary | ICD-10-CM | POA: Diagnosis not present

## 2020-02-01 DIAGNOSIS — C50411 Malignant neoplasm of upper-outer quadrant of right female breast: Secondary | ICD-10-CM | POA: Diagnosis not present

## 2020-02-01 DIAGNOSIS — C7800 Secondary malignant neoplasm of unspecified lung: Secondary | ICD-10-CM | POA: Diagnosis not present

## 2020-02-01 DIAGNOSIS — C7970 Secondary malignant neoplasm of unspecified adrenal gland: Secondary | ICD-10-CM | POA: Diagnosis not present

## 2020-02-02 DIAGNOSIS — C787 Secondary malignant neoplasm of liver and intrahepatic bile duct: Secondary | ICD-10-CM | POA: Diagnosis not present

## 2020-02-02 DIAGNOSIS — C50411 Malignant neoplasm of upper-outer quadrant of right female breast: Secondary | ICD-10-CM | POA: Diagnosis not present

## 2020-02-02 DIAGNOSIS — C7970 Secondary malignant neoplasm of unspecified adrenal gland: Secondary | ICD-10-CM | POA: Diagnosis not present

## 2020-02-02 DIAGNOSIS — C7800 Secondary malignant neoplasm of unspecified lung: Secondary | ICD-10-CM | POA: Diagnosis not present

## 2020-02-02 DIAGNOSIS — I69318 Other symptoms and signs involving cognitive functions following cerebral infarction: Secondary | ICD-10-CM | POA: Diagnosis not present

## 2020-02-02 DIAGNOSIS — C7951 Secondary malignant neoplasm of bone: Secondary | ICD-10-CM | POA: Diagnosis not present

## 2020-02-03 DIAGNOSIS — C50411 Malignant neoplasm of upper-outer quadrant of right female breast: Secondary | ICD-10-CM | POA: Diagnosis not present

## 2020-02-03 DIAGNOSIS — C7800 Secondary malignant neoplasm of unspecified lung: Secondary | ICD-10-CM | POA: Diagnosis not present

## 2020-02-03 DIAGNOSIS — C787 Secondary malignant neoplasm of liver and intrahepatic bile duct: Secondary | ICD-10-CM | POA: Diagnosis not present

## 2020-02-03 DIAGNOSIS — I69318 Other symptoms and signs involving cognitive functions following cerebral infarction: Secondary | ICD-10-CM | POA: Diagnosis not present

## 2020-02-03 DIAGNOSIS — C7951 Secondary malignant neoplasm of bone: Secondary | ICD-10-CM | POA: Diagnosis not present

## 2020-02-03 DIAGNOSIS — C7970 Secondary malignant neoplasm of unspecified adrenal gland: Secondary | ICD-10-CM | POA: Diagnosis not present

## 2020-02-04 DIAGNOSIS — C7970 Secondary malignant neoplasm of unspecified adrenal gland: Secondary | ICD-10-CM | POA: Diagnosis not present

## 2020-02-04 DIAGNOSIS — I69318 Other symptoms and signs involving cognitive functions following cerebral infarction: Secondary | ICD-10-CM | POA: Diagnosis not present

## 2020-02-04 DIAGNOSIS — C50411 Malignant neoplasm of upper-outer quadrant of right female breast: Secondary | ICD-10-CM | POA: Diagnosis not present

## 2020-02-04 DIAGNOSIS — C7951 Secondary malignant neoplasm of bone: Secondary | ICD-10-CM | POA: Diagnosis not present

## 2020-02-04 DIAGNOSIS — C787 Secondary malignant neoplasm of liver and intrahepatic bile duct: Secondary | ICD-10-CM | POA: Diagnosis not present

## 2020-02-04 DIAGNOSIS — C7800 Secondary malignant neoplasm of unspecified lung: Secondary | ICD-10-CM | POA: Diagnosis not present

## 2020-02-05 DIAGNOSIS — C50411 Malignant neoplasm of upper-outer quadrant of right female breast: Secondary | ICD-10-CM | POA: Diagnosis not present

## 2020-02-05 DIAGNOSIS — I69318 Other symptoms and signs involving cognitive functions following cerebral infarction: Secondary | ICD-10-CM | POA: Diagnosis not present

## 2020-02-05 DIAGNOSIS — C787 Secondary malignant neoplasm of liver and intrahepatic bile duct: Secondary | ICD-10-CM | POA: Diagnosis not present

## 2020-02-05 DIAGNOSIS — C7970 Secondary malignant neoplasm of unspecified adrenal gland: Secondary | ICD-10-CM | POA: Diagnosis not present

## 2020-02-05 DIAGNOSIS — C7951 Secondary malignant neoplasm of bone: Secondary | ICD-10-CM | POA: Diagnosis not present

## 2020-02-05 DIAGNOSIS — C7800 Secondary malignant neoplasm of unspecified lung: Secondary | ICD-10-CM | POA: Diagnosis not present

## 2020-02-06 DIAGNOSIS — C7800 Secondary malignant neoplasm of unspecified lung: Secondary | ICD-10-CM | POA: Diagnosis not present

## 2020-02-06 DIAGNOSIS — C7970 Secondary malignant neoplasm of unspecified adrenal gland: Secondary | ICD-10-CM | POA: Diagnosis not present

## 2020-02-06 DIAGNOSIS — C787 Secondary malignant neoplasm of liver and intrahepatic bile duct: Secondary | ICD-10-CM | POA: Diagnosis not present

## 2020-02-06 DIAGNOSIS — C50411 Malignant neoplasm of upper-outer quadrant of right female breast: Secondary | ICD-10-CM | POA: Diagnosis not present

## 2020-02-06 DIAGNOSIS — I69318 Other symptoms and signs involving cognitive functions following cerebral infarction: Secondary | ICD-10-CM | POA: Diagnosis not present

## 2020-02-06 DIAGNOSIS — C7951 Secondary malignant neoplasm of bone: Secondary | ICD-10-CM | POA: Diagnosis not present

## 2020-02-29 DEATH — deceased

## 2020-12-24 IMAGING — MR MR LUMBAR SPINE W/O CM
5 of 6 series · 25 of 48 positions shown · non-contrast
Comparison: CT of the abdomen July 14, 2019. Lumbar spine
radiographs September 03, 2019.

CLINICAL DATA: Low back pain.

EXAM:
MRI LUMBAR SPINE WITHOUT CONTRAST
TECHNIQUE: Multiplanar, multisequence MR imaging of the lumbar spine was
performed. No intravenous contrast was administered.

[Series 5: T1 · sagittal · 4.0mm · 1.02mm/px · 3 of 14 slices shown (1 of 2)]
[im 1/14]
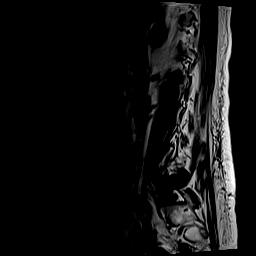
[im 7/14]
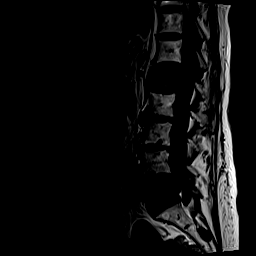
[im 14/14]
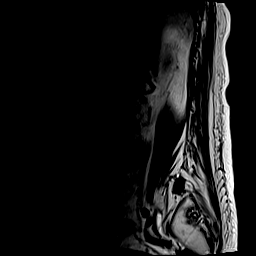

[Series 6: T2 · sagittal · 4.0mm · 1.02mm/px · 3 of 14 slices shown (1 of 2)]
[im 1/14]
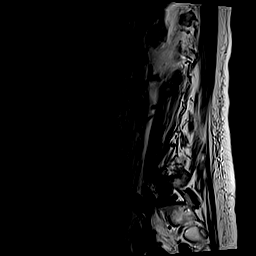
[im 7/14]
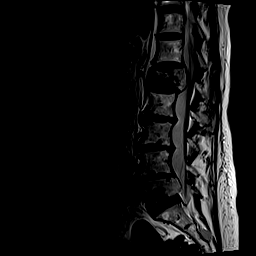
[im 14/14]
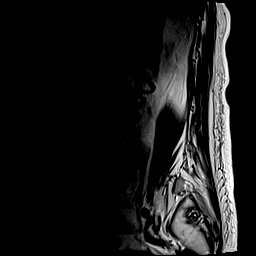

[Series 7: STIR · sagittal · 4.0mm · 0.51mm/px · 3 of 14 slices shown]
[im 1/14]
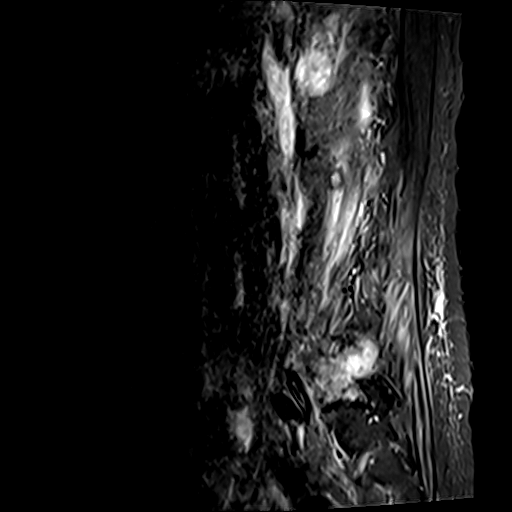
[im 7/14]
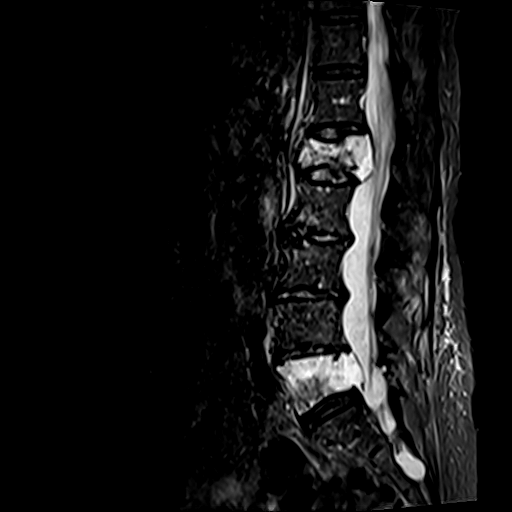
[im 14/14]
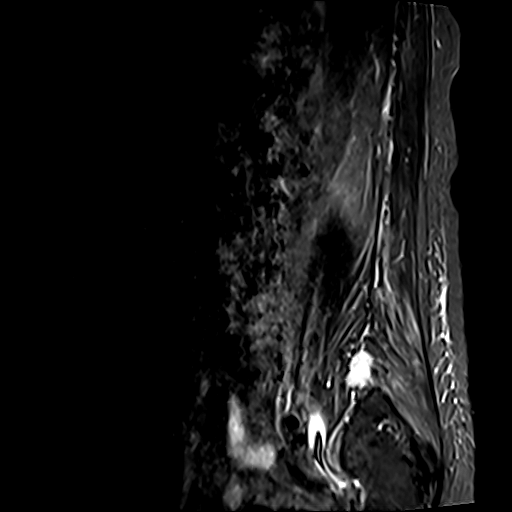

[Series 8: T2 · axial · 4.0mm · 0.78mm/px · z∈[-47,+178]mm · 8 of 43 slices shown (2 of 2)]
[im 1/43]
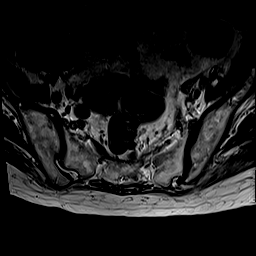
[im 7/43]
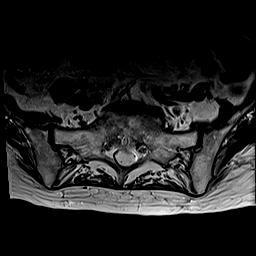
[im 13/43]
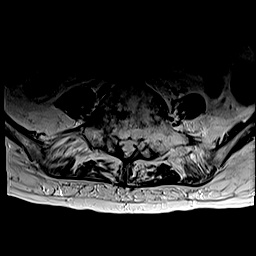
[im 19/43]
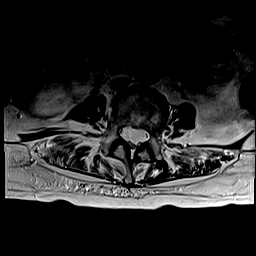
[im 25/43]
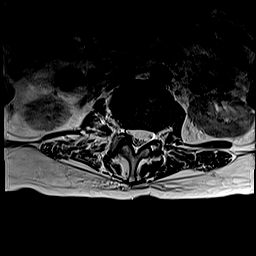
[im 31/43]
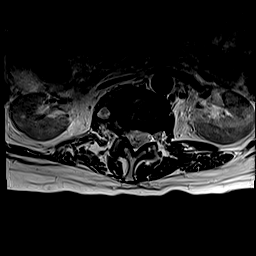
[im 37/43]
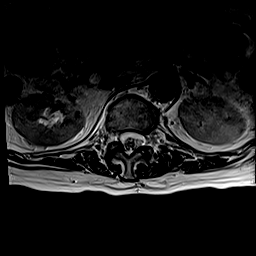
[im 43/43]
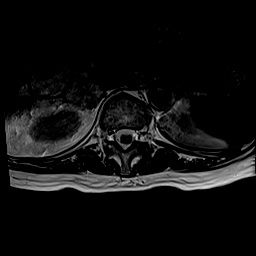

[Series 9: T1 · axial · 4.0mm · 0.43mm/px · z∈[-47,+178]mm · 8 of 43 slices shown (2 of 2)]
[im 1/43]
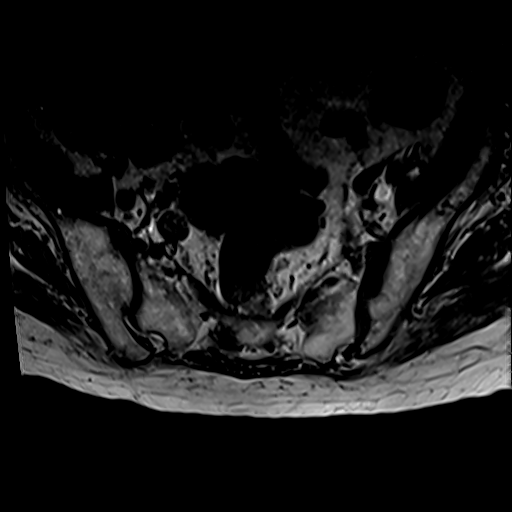
[im 7/43]
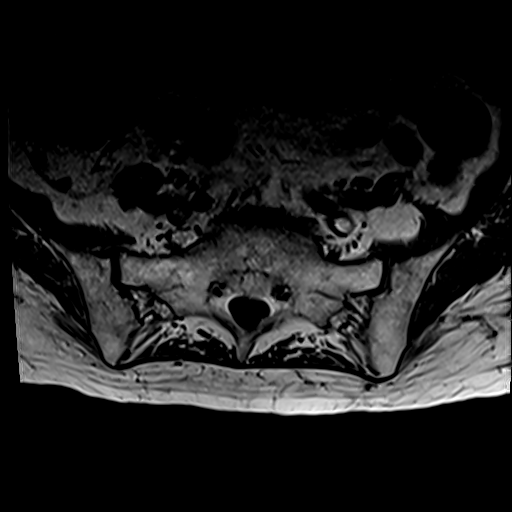
[im 13/43]
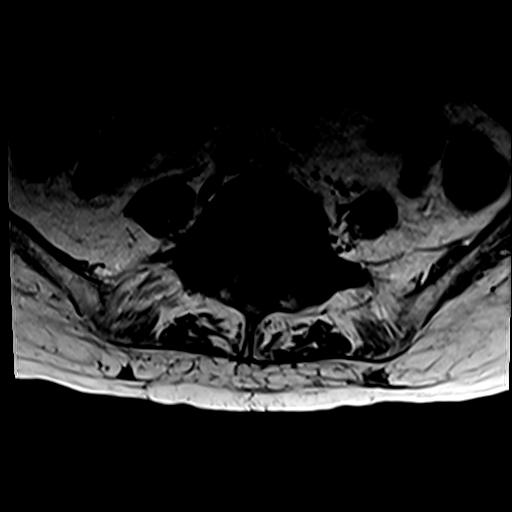
[im 19/43]
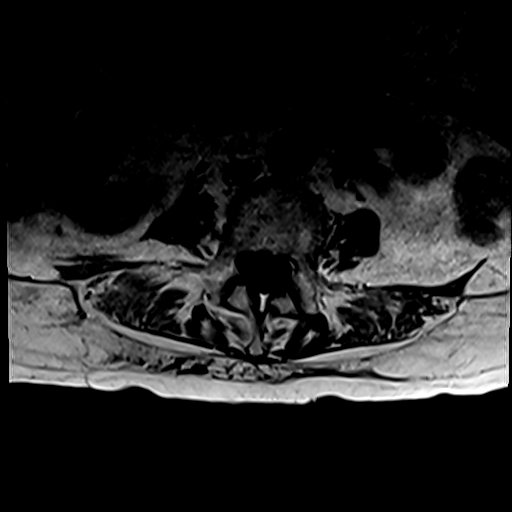
[im 25/43]
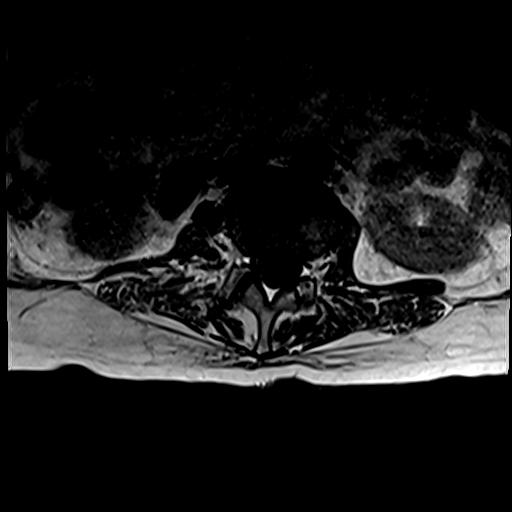
[im 31/43]
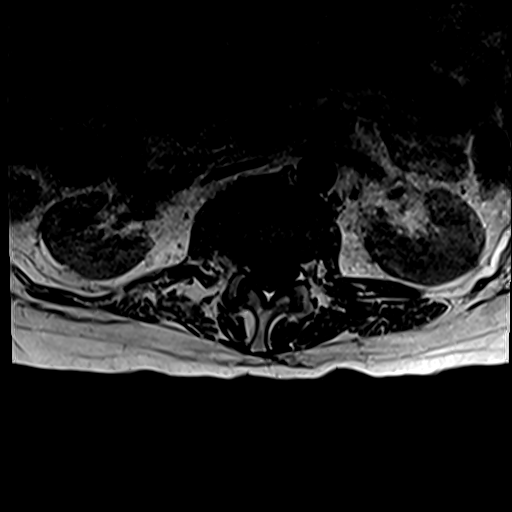
[im 37/43]
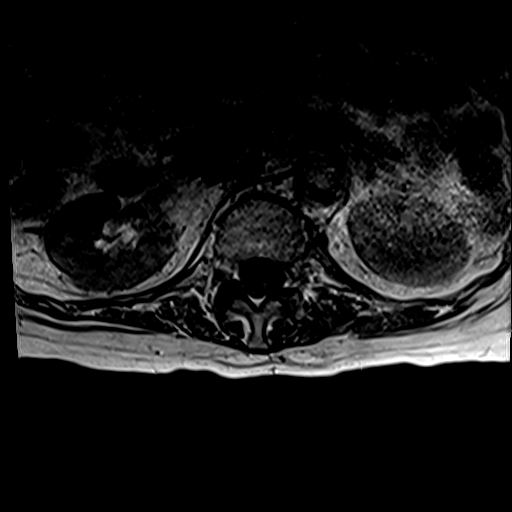
[im 43/43]
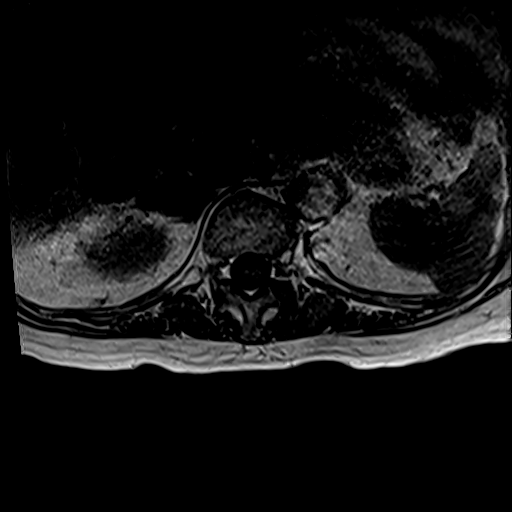

[25 of 48 positions shown; findings below may reference images not displayed]

FINDINGS: Segmentation:  Standard.

Alignment:  Physiologic.

Vertebrae: Diffuse T1 hypointense signal of the L1 and L5 vertebral
body extending into the corresponding pedicles consistent with
secondary involvement. Associated pathologic fractures with
approximately 60% loss of vertebral body height at L1 and 35% at L5.
Retropulsion of the posterior wall into the spinal canal with
effacement of the right lateral recess and mild to moderate
narrowing of the spinal canal at L1 and effacement of the bilateral
lateral recesses at L5.

Conus medullaris and cauda equina: Conus extends to the T12-L1
level. Conus and cauda equina appear normal.

Paraspinal and other soft tissues: Multiple right renal cysts.
Colonic diverticula.

Disc levels:

T12-L1: Narrowing of the right subarticular zone at the disc level
related to L1 expansile lesion. No neural foraminal stenosis. At the
level of the L1 vertebral body, there is effacement of the right
lateral recess and mild to moderate spinal canal stenosis related to
retropulsion of the L1 posterior wall.

L1-2: No spinal canal stenosis. Moderate to severe narrowing of the
right neural foramen related to expansile L1 lesion.

L2-3: Right asymmetric disc bulge and mild facet degenerative
changes without significant spinal canal or neural foraminal
stenosis.

L3-4: Disc bulge and mild facet degenerative changes. No significant
spinal canal or neural foraminal stenosis.

L4-5: No neural foraminal stenosis. No spinal canal stenosis at the
level of the disc space. Effacement of the bilateral lateral
recesses and mild spinal canal stenosis at the level of the L5
vertebral body.

L5-S1: No spinal canal or neural foraminal stenosis.
IMPRESSION: 1. Pathologic fractures at L1 and L5 with approximately 60% and 35%
loss of vertebral body height, respectively.
2. Retropulsion of the posterior wall into the spinal canal
resulting in mild to moderate spinal canal stenosis at L1 and mild
spinal canal stenosis at L5.
3. Moderate to severe right L1-L2 neural foraminal stenosis related
to extensor lesion encroachment.
